# Patient Record
Sex: Male | Born: 1950
Health system: Southern US, Community
[De-identification: ages and names within clinical notes are randomized; demographics above are authoritative.]

## PROBLEM LIST (undated history)

## (undated) DIAGNOSIS — F329 Major depressive disorder, single episode, unspecified: Secondary | ICD-10-CM

## (undated) DIAGNOSIS — E119 Type 2 diabetes mellitus without complications: Secondary | ICD-10-CM

## (undated) DIAGNOSIS — H3530 Unspecified macular degeneration: Secondary | ICD-10-CM

## (undated) DIAGNOSIS — M5030 Other cervical disc degeneration, unspecified cervical region: Secondary | ICD-10-CM

## (undated) DIAGNOSIS — M19042 Primary osteoarthritis, left hand: Secondary | ICD-10-CM

## (undated) DIAGNOSIS — N29 Other disorders of kidney and ureter in diseases classified elsewhere: Secondary | ICD-10-CM

## (undated) DIAGNOSIS — R001 Bradycardia, unspecified: Secondary | ICD-10-CM

## (undated) DIAGNOSIS — M19041 Primary osteoarthritis, right hand: Secondary | ICD-10-CM

## (undated) DIAGNOSIS — M1990 Unspecified osteoarthritis, unspecified site: Secondary | ICD-10-CM

## (undated) DIAGNOSIS — I10 Essential (primary) hypertension: Secondary | ICD-10-CM

## (undated) DIAGNOSIS — N200 Calculus of kidney: Secondary | ICD-10-CM

## (undated) DIAGNOSIS — M069 Rheumatoid arthritis, unspecified: Secondary | ICD-10-CM

## (undated) DIAGNOSIS — M170 Bilateral primary osteoarthritis of knee: Secondary | ICD-10-CM

## (undated) DIAGNOSIS — M064 Inflammatory polyarthropathy: Secondary | ICD-10-CM

## (undated) DIAGNOSIS — E039 Hypothyroidism, unspecified: Secondary | ICD-10-CM

## (undated) DIAGNOSIS — I739 Peripheral vascular disease, unspecified: Secondary | ICD-10-CM

## (undated) DIAGNOSIS — C801 Malignant (primary) neoplasm, unspecified: Secondary | ICD-10-CM

## (undated) DIAGNOSIS — C73 Malignant neoplasm of thyroid gland: Secondary | ICD-10-CM

## (undated) DIAGNOSIS — E785 Hyperlipidemia, unspecified: Secondary | ICD-10-CM

## (undated) DIAGNOSIS — M5136 Other intervertebral disc degeneration, lumbar region: Secondary | ICD-10-CM

## (undated) DIAGNOSIS — N289 Disorder of kidney and ureter, unspecified: Secondary | ICD-10-CM

## (undated) DIAGNOSIS — N486 Induration penis plastica: Secondary | ICD-10-CM

## (undated) DIAGNOSIS — F32A Depression, unspecified: Secondary | ICD-10-CM

## (undated) HISTORY — DX: Depression, unspecified: F32.A

## (undated) HISTORY — PX: GASTRECTOMY: SHX58

## (undated) HISTORY — PX: NEPHRECTOMY: SHX65

## (undated) HISTORY — DX: Disorder of kidney and ureter, unspecified: N28.9

## (undated) HISTORY — DX: Major depressive disorder, single episode, unspecified: F32.9

## (undated) HISTORY — DX: Calculus of kidney: N20.0

## (undated) HISTORY — PX: CHOLECYSTECTOMY: SHX55

## (undated) HISTORY — DX: Hypothyroidism, unspecified: E03.9

## (undated) HISTORY — PX: PANCREATECTOMY: SHX1019

## (undated) HISTORY — PX: GLAUCOMA SURGERY: SHX656

## (undated) HISTORY — DX: Primary osteoarthritis, left hand: M19.042

## (undated) HISTORY — DX: Other cervical disc degeneration, unspecified cervical region: M50.30

## (undated) HISTORY — DX: Essential (primary) hypertension: I10

## (undated) HISTORY — DX: Malignant neoplasm of thyroid gland: C73

## (undated) HISTORY — DX: Rheumatoid arthritis, unspecified: M06.9

## (undated) HISTORY — DX: Type 2 diabetes mellitus without complications: E11.9

## (undated) HISTORY — DX: Other disorders of calcium metabolism: E83.59

## (undated) HISTORY — DX: Malignant (primary) neoplasm, unspecified: C80.1

## (undated) HISTORY — DX: Other intervertebral disc degeneration, lumbar region: M51.36

## (undated) HISTORY — DX: Primary osteoarthritis, right hand: M19.041

## (undated) HISTORY — DX: Unspecified osteoarthritis, unspecified site: M19.90

## (undated) HISTORY — DX: Inflammatory polyarthropathy: M06.4

## (undated) HISTORY — PX: THYROIDECTOMY: SHX17

## (undated) HISTORY — DX: Peripheral vascular disease, unspecified: I73.9

## (undated) HISTORY — PX: WHIPPLE PROCEDURE: SHX2667

## (undated) HISTORY — DX: Other disorders of kidney and ureter in diseases classified elsewhere: N29

## (undated) HISTORY — DX: Bilateral primary osteoarthritis of knee: M17.0

## (undated) HISTORY — PX: SPLENECTOMY: SUR1306

## (undated) HISTORY — DX: Hyperlipidemia, unspecified: E78.5

## (undated) HISTORY — DX: Unspecified macular degeneration: H35.30

---

## 1997-08-04 HISTORY — PX: LIPOMA EXCISION: SHX5283

## 1998-02-16 ENCOUNTER — Other Ambulatory Visit: Admission: RE | Admit: 1998-02-16 | Discharge: 1998-02-16 | Payer: Self-pay | Admitting: Orthopedic Surgery

## 1999-11-27 ENCOUNTER — Encounter: Payer: Self-pay | Admitting: *Deleted

## 1999-11-27 ENCOUNTER — Ambulatory Visit (HOSPITAL_COMMUNITY): Admission: RE | Admit: 1999-11-27 | Discharge: 1999-11-27 | Payer: Self-pay | Admitting: *Deleted

## 1999-11-29 ENCOUNTER — Ambulatory Visit (HOSPITAL_COMMUNITY): Admission: RE | Admit: 1999-11-29 | Discharge: 1999-11-29 | Payer: Self-pay | Admitting: *Deleted

## 1999-11-29 ENCOUNTER — Encounter: Payer: Self-pay | Admitting: *Deleted

## 1999-12-03 ENCOUNTER — Encounter: Payer: Self-pay | Admitting: *Deleted

## 1999-12-03 ENCOUNTER — Encounter: Admission: RE | Admit: 1999-12-03 | Discharge: 1999-12-03 | Payer: Self-pay | Admitting: *Deleted

## 1999-12-17 ENCOUNTER — Encounter: Payer: Self-pay | Admitting: Surgery

## 1999-12-17 ENCOUNTER — Inpatient Hospital Stay (HOSPITAL_COMMUNITY): Admission: RE | Admit: 1999-12-17 | Discharge: 1999-12-27 | Payer: Self-pay | Admitting: Surgery

## 1999-12-17 ENCOUNTER — Encounter (INDEPENDENT_AMBULATORY_CARE_PROVIDER_SITE_OTHER): Payer: Self-pay | Admitting: Specialist

## 1999-12-18 ENCOUNTER — Encounter: Payer: Self-pay | Admitting: Surgery

## 1999-12-24 ENCOUNTER — Encounter: Payer: Self-pay | Admitting: Surgery

## 2000-01-14 ENCOUNTER — Encounter: Admission: RE | Admit: 2000-01-14 | Discharge: 2000-04-13 | Payer: Self-pay | Admitting: Internal Medicine

## 2000-05-18 ENCOUNTER — Ambulatory Visit (HOSPITAL_COMMUNITY): Admission: RE | Admit: 2000-05-18 | Discharge: 2000-05-18 | Payer: Self-pay | Admitting: Oncology

## 2000-05-18 ENCOUNTER — Encounter: Payer: Self-pay | Admitting: Oncology

## 2000-09-17 ENCOUNTER — Ambulatory Visit (HOSPITAL_COMMUNITY): Admission: RE | Admit: 2000-09-17 | Discharge: 2000-09-17 | Payer: Self-pay | Admitting: Oncology

## 2000-09-17 ENCOUNTER — Encounter: Payer: Self-pay | Admitting: Oncology

## 2001-01-29 ENCOUNTER — Encounter: Payer: Self-pay | Admitting: Oncology

## 2001-01-29 ENCOUNTER — Ambulatory Visit (HOSPITAL_COMMUNITY): Admission: RE | Admit: 2001-01-29 | Discharge: 2001-01-29 | Payer: Self-pay | Admitting: Oncology

## 2001-02-20 ENCOUNTER — Encounter: Payer: Self-pay | Admitting: Emergency Medicine

## 2001-02-21 ENCOUNTER — Inpatient Hospital Stay (HOSPITAL_COMMUNITY): Admission: EM | Admit: 2001-02-21 | Discharge: 2001-02-24 | Payer: Self-pay | Admitting: Emergency Medicine

## 2001-02-22 ENCOUNTER — Encounter: Payer: Self-pay | Admitting: Gastroenterology

## 2001-10-01 ENCOUNTER — Encounter: Payer: Self-pay | Admitting: Oncology

## 2001-10-01 ENCOUNTER — Ambulatory Visit (HOSPITAL_COMMUNITY): Admission: RE | Admit: 2001-10-01 | Discharge: 2001-10-01 | Payer: Self-pay | Admitting: Oncology

## 2001-12-10 ENCOUNTER — Encounter: Payer: Self-pay | Admitting: Gastroenterology

## 2001-12-13 ENCOUNTER — Ambulatory Visit (HOSPITAL_COMMUNITY): Admission: RE | Admit: 2001-12-13 | Discharge: 2001-12-13 | Payer: Self-pay | Admitting: Gastroenterology

## 2001-12-13 ENCOUNTER — Encounter: Payer: Self-pay | Admitting: Gastroenterology

## 2001-12-14 ENCOUNTER — Ambulatory Visit (HOSPITAL_COMMUNITY): Admission: RE | Admit: 2001-12-14 | Discharge: 2001-12-14 | Payer: Self-pay | Admitting: Gastroenterology

## 2001-12-14 ENCOUNTER — Encounter: Payer: Self-pay | Admitting: Gastroenterology

## 2002-01-19 ENCOUNTER — Ambulatory Visit (HOSPITAL_COMMUNITY): Admission: RE | Admit: 2002-01-19 | Discharge: 2002-01-19 | Payer: Self-pay | Admitting: Gastroenterology

## 2002-01-19 ENCOUNTER — Encounter: Payer: Self-pay | Admitting: Gastroenterology

## 2002-04-01 ENCOUNTER — Encounter: Payer: Self-pay | Admitting: Oncology

## 2002-04-01 ENCOUNTER — Ambulatory Visit (HOSPITAL_COMMUNITY): Admission: RE | Admit: 2002-04-01 | Discharge: 2002-04-01 | Payer: Self-pay | Admitting: Oncology

## 2002-09-30 ENCOUNTER — Encounter: Payer: Self-pay | Admitting: Oncology

## 2002-09-30 ENCOUNTER — Ambulatory Visit (HOSPITAL_COMMUNITY): Admission: RE | Admit: 2002-09-30 | Discharge: 2002-09-30 | Payer: Self-pay | Admitting: Oncology

## 2002-10-17 ENCOUNTER — Ambulatory Visit (HOSPITAL_COMMUNITY): Admission: RE | Admit: 2002-10-17 | Discharge: 2002-10-17 | Payer: Self-pay | Admitting: Oncology

## 2002-10-17 ENCOUNTER — Encounter: Payer: Self-pay | Admitting: Oncology

## 2002-11-04 ENCOUNTER — Ambulatory Visit (HOSPITAL_COMMUNITY): Admission: RE | Admit: 2002-11-04 | Discharge: 2002-11-04 | Payer: Self-pay | Admitting: Oncology

## 2002-11-04 ENCOUNTER — Encounter: Payer: Self-pay | Admitting: Oncology

## 2002-11-04 ENCOUNTER — Encounter (INDEPENDENT_AMBULATORY_CARE_PROVIDER_SITE_OTHER): Payer: Self-pay | Admitting: *Deleted

## 2003-01-06 ENCOUNTER — Observation Stay (HOSPITAL_COMMUNITY): Admission: RE | Admit: 2003-01-06 | Discharge: 2003-01-07 | Payer: Self-pay | Admitting: Surgery

## 2003-01-06 ENCOUNTER — Encounter (INDEPENDENT_AMBULATORY_CARE_PROVIDER_SITE_OTHER): Payer: Self-pay | Admitting: Specialist

## 2003-04-03 ENCOUNTER — Ambulatory Visit (HOSPITAL_COMMUNITY): Admission: RE | Admit: 2003-04-03 | Discharge: 2003-04-03 | Payer: Self-pay | Admitting: Oncology

## 2003-04-03 ENCOUNTER — Encounter: Payer: Self-pay | Admitting: Oncology

## 2003-09-26 ENCOUNTER — Other Ambulatory Visit: Admission: RE | Admit: 2003-09-26 | Discharge: 2003-09-26 | Payer: Self-pay | Admitting: Oncology

## 2003-09-26 ENCOUNTER — Inpatient Hospital Stay (HOSPITAL_COMMUNITY): Admission: EM | Admit: 2003-09-26 | Discharge: 2003-10-06 | Payer: Self-pay | Admitting: Emergency Medicine

## 2003-09-27 ENCOUNTER — Encounter: Payer: Self-pay | Admitting: Internal Medicine

## 2003-09-27 ENCOUNTER — Encounter (INDEPENDENT_AMBULATORY_CARE_PROVIDER_SITE_OTHER): Payer: Self-pay | Admitting: Specialist

## 2003-09-29 ENCOUNTER — Encounter (INDEPENDENT_AMBULATORY_CARE_PROVIDER_SITE_OTHER): Payer: Self-pay | Admitting: Specialist

## 2004-02-12 ENCOUNTER — Ambulatory Visit (HOSPITAL_COMMUNITY): Admission: RE | Admit: 2004-02-12 | Discharge: 2004-02-12 | Payer: Self-pay | Admitting: Oncology

## 2006-12-15 ENCOUNTER — Ambulatory Visit: Payer: Self-pay | Admitting: Oncology

## 2006-12-15 LAB — CBC WITH DIFFERENTIAL/PLATELET
BASO%: 0.5 % (ref 0.0–2.0)
EOS%: 2.6 % (ref 0.0–7.0)
LYMPH%: 42.4 % (ref 14.0–48.0)
MCH: 33.1 pg (ref 28.0–33.4)
MCHC: 34.8 g/dL (ref 32.0–35.9)
MONO#: 1 10*3/uL — ABNORMAL HIGH (ref 0.1–0.9)
Platelets: 277 10*3/uL (ref 145–400)
RBC: 4.3 10*6/uL (ref 4.20–5.71)
WBC: 6.8 10*3/uL (ref 4.0–10.0)
lymph#: 2.9 10*3/uL (ref 0.9–3.3)

## 2006-12-15 LAB — COMPREHENSIVE METABOLIC PANEL
ALT: 23 U/L (ref 0–53)
AST: 22 U/L (ref 0–37)
CO2: 27 mEq/L (ref 19–32)
Creatinine, Ser: 1.03 mg/dL (ref 0.40–1.50)
Sodium: 141 mEq/L (ref 135–145)
Total Bilirubin: 2.2 mg/dL — ABNORMAL HIGH (ref 0.3–1.2)
Total Protein: 6.9 g/dL (ref 6.0–8.3)

## 2006-12-15 LAB — LACTATE DEHYDROGENASE: LDH: 139 U/L (ref 94–250)

## 2006-12-21 ENCOUNTER — Ambulatory Visit (HOSPITAL_COMMUNITY): Admission: RE | Admit: 2006-12-21 | Discharge: 2006-12-21 | Payer: Self-pay | Admitting: Oncology

## 2007-02-03 ENCOUNTER — Ambulatory Visit: Payer: Self-pay | Admitting: Oncology

## 2007-02-08 LAB — CBC WITH DIFFERENTIAL/PLATELET
BASO%: 0.4 % (ref 0.0–2.0)
Basophils Absolute: 0 10*3/uL (ref 0.0–0.1)
EOS%: 2.2 % (ref 0.0–7.0)
HCT: 42.9 % (ref 38.7–49.9)
HGB: 14.9 g/dL (ref 13.0–17.1)
LYMPH%: 33.3 % (ref 14.0–48.0)
MCH: 33.3 pg (ref 28.0–33.4)
MCHC: 34.8 g/dL (ref 32.0–35.9)
MCV: 95.8 fL (ref 81.6–98.0)
MONO%: 14.6 % — ABNORMAL HIGH (ref 0.0–13.0)
NEUT%: 49.5 % (ref 40.0–75.0)
Platelets: 218 10*3/uL (ref 145–400)
lymph#: 2.2 10*3/uL (ref 0.9–3.3)

## 2007-02-08 LAB — COMPREHENSIVE METABOLIC PANEL
ALT: 23 U/L (ref 0–53)
AST: 22 U/L (ref 0–37)
Alkaline Phosphatase: 98 U/L (ref 39–117)
BUN: 15 mg/dL (ref 6–23)
Calcium: 9.2 mg/dL (ref 8.4–10.5)
Chloride: 106 mEq/L (ref 96–112)
Creatinine, Ser: 1.09 mg/dL (ref 0.40–1.50)
Total Bilirubin: 1.4 mg/dL — ABNORMAL HIGH (ref 0.3–1.2)

## 2007-02-09 ENCOUNTER — Ambulatory Visit (HOSPITAL_COMMUNITY): Admission: RE | Admit: 2007-02-09 | Discharge: 2007-02-09 | Payer: Self-pay | Admitting: Oncology

## 2007-02-10 ENCOUNTER — Ambulatory Visit: Payer: Self-pay | Admitting: Gastroenterology

## 2007-08-16 ENCOUNTER — Ambulatory Visit: Payer: Self-pay | Admitting: Oncology

## 2007-08-18 LAB — COMPREHENSIVE METABOLIC PANEL
Albumin: 4 g/dL (ref 3.5–5.2)
BUN: 16 mg/dL (ref 6–23)
Calcium: 8.7 mg/dL (ref 8.4–10.5)
Chloride: 104 mEq/L (ref 96–112)
Creatinine, Ser: 1.03 mg/dL (ref 0.40–1.50)
Glucose, Bld: 337 mg/dL — ABNORMAL HIGH (ref 70–99)
Potassium: 4.4 mEq/L (ref 3.5–5.3)

## 2007-08-18 LAB — CBC WITH DIFFERENTIAL/PLATELET
Basophils Absolute: 0 10*3/uL (ref 0.0–0.1)
Eosinophils Absolute: 0.1 10*3/uL (ref 0.0–0.5)
HCT: 42.3 % (ref 38.7–49.9)
HGB: 14.3 g/dL (ref 13.0–17.1)
MCH: 33 pg (ref 28.0–33.4)
MCV: 97.6 fL (ref 81.6–98.0)
MONO%: 13.9 % — ABNORMAL HIGH (ref 0.0–13.0)
NEUT#: 6.3 10*3/uL (ref 1.5–6.5)
NEUT%: 66.9 % (ref 40.0–75.0)
RDW: 13.4 % (ref 11.2–14.6)

## 2007-08-19 ENCOUNTER — Ambulatory Visit (HOSPITAL_COMMUNITY): Admission: RE | Admit: 2007-08-19 | Discharge: 2007-08-19 | Payer: Self-pay | Admitting: Oncology

## 2007-12-04 DIAGNOSIS — I10 Essential (primary) hypertension: Secondary | ICD-10-CM

## 2007-12-04 DIAGNOSIS — C7900 Secondary malignant neoplasm of unspecified kidney and renal pelvis: Secondary | ICD-10-CM | POA: Insufficient documentation

## 2007-12-04 DIAGNOSIS — R51 Headache: Secondary | ICD-10-CM

## 2007-12-04 DIAGNOSIS — K209 Esophagitis, unspecified without bleeding: Secondary | ICD-10-CM | POA: Insufficient documentation

## 2007-12-04 DIAGNOSIS — R519 Headache, unspecified: Secondary | ICD-10-CM | POA: Insufficient documentation

## 2007-12-04 DIAGNOSIS — E119 Type 2 diabetes mellitus without complications: Secondary | ICD-10-CM

## 2007-12-04 DIAGNOSIS — K921 Melena: Secondary | ICD-10-CM | POA: Insufficient documentation

## 2007-12-04 DIAGNOSIS — E039 Hypothyroidism, unspecified: Secondary | ICD-10-CM

## 2008-02-14 ENCOUNTER — Ambulatory Visit: Payer: Self-pay | Admitting: Oncology

## 2008-02-16 LAB — COMPREHENSIVE METABOLIC PANEL
ALT: 40 U/L (ref 0–53)
Albumin: 4.3 g/dL (ref 3.5–5.2)
CO2: 22 mEq/L (ref 19–32)
Chloride: 105 mEq/L (ref 96–112)
Glucose, Bld: 167 mg/dL — ABNORMAL HIGH (ref 70–99)
Potassium: 4.6 mEq/L (ref 3.5–5.3)
Sodium: 139 mEq/L (ref 135–145)
Total Protein: 6.2 g/dL (ref 6.0–8.3)

## 2008-02-16 LAB — CBC WITH DIFFERENTIAL/PLATELET
Eosinophils Absolute: 0.1 10*3/uL (ref 0.0–0.5)
MONO#: 0.9 10*3/uL (ref 0.1–0.9)
NEUT#: 4.1 10*3/uL (ref 1.5–6.5)
Platelets: 205 10*3/uL (ref 145–400)
RBC: 4.13 10*6/uL — ABNORMAL LOW (ref 4.20–5.71)
RDW: 12.9 % (ref 11.2–14.6)
WBC: 7.8 10*3/uL (ref 4.0–10.0)
lymph#: 2.7 10*3/uL (ref 0.9–3.3)

## 2008-02-16 LAB — LACTATE DEHYDROGENASE: LDH: 231 U/L (ref 94–250)

## 2008-02-18 ENCOUNTER — Ambulatory Visit (HOSPITAL_COMMUNITY): Admission: RE | Admit: 2008-02-18 | Discharge: 2008-02-18 | Payer: Self-pay | Admitting: Oncology

## 2008-02-23 ENCOUNTER — Encounter: Payer: Self-pay | Admitting: Gastroenterology

## 2008-09-06 ENCOUNTER — Observation Stay (HOSPITAL_COMMUNITY): Admission: EM | Admit: 2008-09-06 | Discharge: 2008-09-07 | Payer: Self-pay | Admitting: Emergency Medicine

## 2008-09-18 ENCOUNTER — Ambulatory Visit (HOSPITAL_COMMUNITY): Admission: RE | Admit: 2008-09-18 | Discharge: 2008-09-18 | Payer: Self-pay | Admitting: Urology

## 2008-09-19 ENCOUNTER — Ambulatory Visit: Payer: Self-pay | Admitting: Oncology

## 2008-10-12 ENCOUNTER — Ambulatory Visit (HOSPITAL_BASED_OUTPATIENT_CLINIC_OR_DEPARTMENT_OTHER): Admission: RE | Admit: 2008-10-12 | Discharge: 2008-10-12 | Payer: Self-pay | Admitting: Urology

## 2010-08-04 HISTORY — PX: URETERAL STENT PLACEMENT: SHX822

## 2010-08-24 ENCOUNTER — Encounter: Payer: Self-pay | Admitting: Oncology

## 2010-08-25 ENCOUNTER — Encounter: Payer: Self-pay | Admitting: Oncology

## 2010-10-26 ENCOUNTER — Emergency Department (HOSPITAL_COMMUNITY): Payer: Medicare Other

## 2010-10-26 ENCOUNTER — Inpatient Hospital Stay (HOSPITAL_COMMUNITY)
Admission: EM | Admit: 2010-10-26 | Discharge: 2010-10-31 | DRG: 669 | Disposition: A | Discharge status: Home / Self Care | Payer: Medicare Other | Attending: Internal Medicine | Admitting: Internal Medicine

## 2010-10-26 DIAGNOSIS — N201 Calculus of ureter: Secondary | ICD-10-CM | POA: Diagnosis present

## 2010-10-26 DIAGNOSIS — G8929 Other chronic pain: Secondary | ICD-10-CM | POA: Diagnosis present

## 2010-10-26 DIAGNOSIS — I10 Essential (primary) hypertension: Secondary | ICD-10-CM | POA: Diagnosis present

## 2010-10-26 DIAGNOSIS — Z905 Acquired absence of kidney: Secondary | ICD-10-CM

## 2010-10-26 DIAGNOSIS — I70209 Unspecified atherosclerosis of native arteries of extremities, unspecified extremity: Secondary | ICD-10-CM | POA: Diagnosis present

## 2010-10-26 DIAGNOSIS — Z794 Long term (current) use of insulin: Secondary | ICD-10-CM

## 2010-10-26 DIAGNOSIS — N1330 Unspecified hydronephrosis: Secondary | ICD-10-CM | POA: Diagnosis present

## 2010-10-26 DIAGNOSIS — Z903 Acquired absence of stomach [part of]: Secondary | ICD-10-CM

## 2010-10-26 DIAGNOSIS — E109 Type 1 diabetes mellitus without complications: Secondary | ICD-10-CM | POA: Diagnosis present

## 2010-10-26 DIAGNOSIS — E785 Hyperlipidemia, unspecified: Secondary | ICD-10-CM | POA: Diagnosis present

## 2010-10-26 DIAGNOSIS — M159 Polyosteoarthritis, unspecified: Secondary | ICD-10-CM | POA: Diagnosis present

## 2010-10-26 DIAGNOSIS — Z85528 Personal history of other malignant neoplasm of kidney: Secondary | ICD-10-CM

## 2010-10-26 DIAGNOSIS — N179 Acute kidney failure, unspecified: Principal | ICD-10-CM | POA: Diagnosis present

## 2010-10-26 DIAGNOSIS — Z79899 Other long term (current) drug therapy: Secondary | ICD-10-CM

## 2010-10-26 DIAGNOSIS — E46 Unspecified protein-calorie malnutrition: Secondary | ICD-10-CM | POA: Diagnosis present

## 2010-10-26 DIAGNOSIS — K219 Gastro-esophageal reflux disease without esophagitis: Secondary | ICD-10-CM | POA: Diagnosis present

## 2010-10-26 DIAGNOSIS — K8689 Other specified diseases of pancreas: Secondary | ICD-10-CM | POA: Diagnosis present

## 2010-10-26 DIAGNOSIS — Z85850 Personal history of malignant neoplasm of thyroid: Secondary | ICD-10-CM

## 2010-10-26 DIAGNOSIS — Z9089 Acquired absence of other organs: Secondary | ICD-10-CM

## 2010-10-26 LAB — CBC
HCT: 40.7 % (ref 39.0–52.0)
Hemoglobin: 14.2 g/dL (ref 13.0–17.0)
MCH: 33.1 pg (ref 26.0–34.0)
MCV: 94.9 fL (ref 78.0–100.0)
Platelets: 185 10*3/uL (ref 150–400)
RBC: 4.29 MIL/uL (ref 4.22–5.81)
WBC: 15.1 10*3/uL — ABNORMAL HIGH (ref 4.0–10.5)

## 2010-10-26 LAB — BASIC METABOLIC PANEL
CO2: 22 mEq/L (ref 19–32)
Calcium: 7.4 mg/dL — ABNORMAL LOW (ref 8.4–10.5)
GFR calc Af Amer: 9 mL/min — ABNORMAL LOW (ref >60)
Glucose, Bld: 280 mg/dL — ABNORMAL HIGH (ref 70–99)
Potassium: 4 mEq/L (ref 3.5–5.1)
Sodium: 130 mEq/L — ABNORMAL LOW (ref 135–145)

## 2010-10-26 LAB — DIFFERENTIAL
Eosinophils Absolute: 0.1 10*3/uL (ref 0.0–0.7)
Lymphocytes Relative: 15 % (ref 12–46)
Lymphs Abs: 2.2 10*3/uL (ref 0.7–4.0)
Monocytes Relative: 16 % — ABNORMAL HIGH (ref 3–12)
Neutro Abs: 10.4 10*3/uL — ABNORMAL HIGH (ref 1.7–7.7)
Neutrophils Relative %: 69 % (ref 43–77)

## 2010-10-26 LAB — APTT: aPTT: 29 seconds (ref 24–37)

## 2010-10-26 LAB — COMPREHENSIVE METABOLIC PANEL
AST: 24 U/L (ref 0–37)
Albumin: 3.5 g/dL (ref 3.5–5.2)
Alkaline Phosphatase: 81 U/L (ref 39–117)
BUN: 55 mg/dL — ABNORMAL HIGH (ref 6–23)
Creatinine, Ser: 7.87 mg/dL — ABNORMAL HIGH (ref 0.4–1.5)
GFR calc Af Amer: 9 mL/min — ABNORMAL LOW (ref >60)
Potassium: 4.1 mEq/L (ref 3.5–5.1)
Total Protein: 6.2 g/dL (ref 6.0–8.3)

## 2010-10-26 LAB — GLUCOSE, CAPILLARY
Glucose-Capillary: 298 mg/dL — ABNORMAL HIGH (ref 70–99)
Glucose-Capillary: 306 mg/dL — ABNORMAL HIGH (ref 70–99)
Glucose-Capillary: 351 mg/dL — ABNORMAL HIGH (ref 70–99)

## 2010-10-26 LAB — MRSA PCR SCREENING: MRSA by PCR: POSITIVE — AB

## 2010-10-26 LAB — PROTIME-INR: INR: 1.09 (ref 0.00–1.49)

## 2010-10-27 LAB — GLUCOSE, CAPILLARY
Glucose-Capillary: 455 mg/dL — ABNORMAL HIGH (ref 70–99)
Glucose-Capillary: 387 mg/dL — ABNORMAL HIGH (ref 70–99)
Glucose-Capillary: 311 mg/dL — ABNORMAL HIGH (ref 70–99)

## 2010-10-27 LAB — DIFFERENTIAL
Eosinophils Absolute: 0.1 10*3/uL (ref 0.0–0.7)
Eosinophils Relative: 1 % (ref 0–5)
Lymphs Abs: 1.7 10*3/uL (ref 0.7–4.0)
Monocytes Relative: 20 % — ABNORMAL HIGH (ref 3–12)

## 2010-10-27 LAB — PHOSPHORUS: Phosphorus: 4.1 mg/dL (ref 2.3–4.6)

## 2010-10-27 LAB — HEPATIC FUNCTION PANEL
AST: 16 U/L (ref 0–37)
Albumin: 2.6 g/dL — ABNORMAL LOW (ref 3.5–5.2)
Alkaline Phosphatase: 66 U/L (ref 39–117)
Total Bilirubin: 1.2 mg/dL (ref 0.3–1.2)
Total Protein: 4.8 g/dL — ABNORMAL LOW (ref 6.0–8.3)

## 2010-10-27 LAB — CBC
MCH: 32.2 pg (ref 26.0–34.0)
MCV: 95.3 fL (ref 78.0–100.0)

## 2010-10-27 LAB — BASIC METABOLIC PANEL
CO2: 18 mEq/L — ABNORMAL LOW (ref 19–32)
Calcium: 6.4 mg/dL — CL (ref 8.4–10.5)
Chloride: 108 mEq/L (ref 96–112)
Creatinine, Ser: 4.4 mg/dL — ABNORMAL HIGH (ref 0.4–1.5)
Glucose, Bld: 297 mg/dL — ABNORMAL HIGH (ref 70–99)

## 2010-10-28 LAB — CBC
Hemoglobin: 11.1 g/dL — ABNORMAL LOW (ref 13.0–17.0)
MCV: 92.8 fL (ref 78.0–100.0)
Platelets: 151 10*3/uL (ref 150–400)
RBC: 3.47 MIL/uL — ABNORMAL LOW (ref 4.22–5.81)
WBC: 10.3 10*3/uL (ref 4.0–10.5)

## 2010-10-28 LAB — GLUCOSE, CAPILLARY
Glucose-Capillary: 232 mg/dL — ABNORMAL HIGH (ref 70–99)
Glucose-Capillary: 226 mg/dL — ABNORMAL HIGH (ref 70–99)

## 2010-10-28 LAB — DIFFERENTIAL
Basophils Absolute: 0 10*3/uL (ref 0.0–0.1)
Basophils Relative: 0 % (ref 0–1)
Eosinophils Absolute: 0.1 10*3/uL (ref 0.0–0.7)
Lymphocytes Relative: 29 % (ref 12–46)
Monocytes Relative: 18 % — ABNORMAL HIGH (ref 3–12)
Neutro Abs: 5.3 10*3/uL (ref 1.7–7.7)
Neutrophils Relative %: 52 % (ref 43–77)

## 2010-10-28 LAB — BASIC METABOLIC PANEL
BUN: 27 mg/dL — ABNORMAL HIGH (ref 6–23)
Chloride: 115 mEq/L — ABNORMAL HIGH (ref 96–112)
Creatinine, Ser: 1.68 mg/dL — ABNORMAL HIGH (ref 0.4–1.5)
Glucose, Bld: 146 mg/dL — ABNORMAL HIGH (ref 70–99)
Potassium: 3.2 mEq/L — ABNORMAL LOW (ref 3.5–5.1)

## 2010-10-28 LAB — HEPATIC FUNCTION PANEL
ALT: 13 U/L (ref 0–53)
AST: 13 U/L (ref 0–37)
Alkaline Phosphatase: 57 U/L (ref 39–117)
Total Protein: 4.8 g/dL — ABNORMAL LOW (ref 6.0–8.3)

## 2010-10-29 LAB — DIFFERENTIAL
Eosinophils Relative: 3 % (ref 0–5)
Lymphocytes Relative: 37 % (ref 12–46)
Lymphs Abs: 3.3 10*3/uL (ref 0.7–4.0)
Monocytes Absolute: 1.8 10*3/uL — ABNORMAL HIGH (ref 0.1–1.0)
Monocytes Relative: 20 % — ABNORMAL HIGH (ref 3–12)

## 2010-10-29 LAB — BASIC METABOLIC PANEL
BUN: 14 mg/dL (ref 6–23)
Creatinine, Ser: 1.25 mg/dL (ref 0.4–1.5)
GFR calc non Af Amer: 59 mL/min — ABNORMAL LOW (ref >60)
Glucose, Bld: 123 mg/dL — ABNORMAL HIGH (ref 70–99)
Potassium: 3.5 mEq/L (ref 3.5–5.1)

## 2010-10-29 LAB — HEPATIC FUNCTION PANEL
ALT: 14 U/L (ref 0–53)
Alkaline Phosphatase: 53 U/L (ref 39–117)
Bilirubin, Direct: 0.2 mg/dL (ref 0.0–0.3)
Indirect Bilirubin: 0.7 mg/dL (ref 0.3–0.9)

## 2010-10-29 LAB — CBC
HCT: 30.9 % — ABNORMAL LOW (ref 39.0–52.0)
MCH: 32.3 pg (ref 26.0–34.0)
MCV: 93.4 fL (ref 78.0–100.0)
RDW: 13.2 % (ref 11.5–15.5)
WBC: 9.1 10*3/uL (ref 4.0–10.5)

## 2010-10-29 LAB — GLUCOSE, CAPILLARY
Glucose-Capillary: 133 mg/dL — ABNORMAL HIGH (ref 70–99)
Glucose-Capillary: 207 mg/dL — ABNORMAL HIGH (ref 70–99)

## 2010-10-30 LAB — CBC
Hemoglobin: 11.2 g/dL — ABNORMAL LOW (ref 13.0–17.0)
MCH: 31.5 pg (ref 26.0–34.0)
Platelets: 190 10*3/uL (ref 150–400)
RBC: 3.56 MIL/uL — ABNORMAL LOW (ref 4.22–5.81)
WBC: 7.6 10*3/uL (ref 4.0–10.5)

## 2010-10-30 LAB — GLUCOSE, CAPILLARY
Glucose-Capillary: 107 mg/dL — ABNORMAL HIGH (ref 70–99)
Glucose-Capillary: 277 mg/dL — ABNORMAL HIGH (ref 70–99)

## 2010-10-30 LAB — HEPATIC FUNCTION PANEL
ALT: 16 U/L (ref 0–53)
Albumin: 2.6 g/dL — ABNORMAL LOW (ref 3.5–5.2)
Indirect Bilirubin: 0.7 mg/dL (ref 0.3–0.9)
Total Protein: 4.9 g/dL — ABNORMAL LOW (ref 6.0–8.3)

## 2010-10-30 LAB — DIFFERENTIAL
Basophils Relative: 1 % (ref 0–1)
Eosinophils Absolute: 0.3 10*3/uL (ref 0.0–0.7)
Neutrophils Relative %: 40 % — ABNORMAL LOW (ref 43–77)

## 2010-10-30 LAB — BASIC METABOLIC PANEL
BUN: 7 mg/dL (ref 6–23)
CO2: 28 mEq/L (ref 19–32)
Chloride: 112 mEq/L (ref 96–112)
Potassium: 3.6 mEq/L (ref 3.5–5.1)

## 2010-10-30 NOTE — Op Note (Signed)
  NAMECOLE, FURRH             ACCOUNT NO.:  [id number]  MEDICAL RECORD NO.:  [id number]           PATIENT TYPE:  E  LOCATION:  WLED                         FACILITY:  [facility]  PHYSICIAN:  Piotr Christopher I. Gaynelle Arabian, M.D.DATE OF BIRTH:  Dec 26, 1950  DATE OF PROCEDURE:  10/26/2010 DATE OF DISCHARGE:                              OPERATIVE REPORT   PREOPERATIVE DIAGNOSIS:  Solitary left kidney with complete impaction of left lower ureteral calculus.  POSTOPERATIVE DIAGNOSIS:  Solitary left kidney with complete impaction of left lower ureteral calculus.  OPERATION:  Cystourethroscopy, left retrograde pyelogram with interpretation, left ureteroscopy and laser fragmentation of ureteral stone, and basket extraction of stone.  SURGEON:  Nadra Hritz I. Gaynelle Arabian, M.D.  ANESTHESIA:  General LMA.  PREPARATION:  After appropriate preanesthesia, the patient was brought to the operating room, placed on the operating room in dorsal supine position where general LMA anesthesia was induced.  He was then replaced in dorsal lithotomy position where pubis was prepped with Betadine solution and draped in usual fashion.  REVIEW OF HISTORY:  Mr. Coffee is a 60 year old Caucasian male, seen in [place] [facility] with a solitary left kidney, and complete obstruction from the left lower ureteral calculus, measuring 7.3 mm. The patient's creatinine is 7.5.  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  The patient has tolerated procedure well.  He is now for ureteroscopy and basket extraction.  PROCEDURE IN DETAIL:  Cystourethroscopy was accomplished and left retrograde pyelogram was performed, which showed impacted stone in the left ureter with minimal contrast above the stone.  The laser was then placed through the  ureteroscope and the stone was fragmented in multiple pieces.  These were removed.  There was some "dust" remaining in the ureter at the end, I elected to place double-J stent because of the patient's elevated creatinine and solitary kidney, and therefore a 6-French x 24 cm stent was placed without difficulty.  The patient tolerated procedure well.  He was given a B and O suppository.  He was awakened and taken to recovery room in good condition.     Loudon Krakow I. Gaynelle Arabian, M.D.     SIT/MEDQ  D:  10/26/2010  T:  10/26/2010  Job:  [id number]  cc:   Elta Guadeloupe A. Perini, M.D. FaxCO:[phone]  Electronically Signed by Carolan Clines M.D. on 10/30/2010 09:51:26 AM

## 2010-10-30 NOTE — Consult Note (Signed)
NAMEDEMARIUS, ALVIRA             ACCOUNT NO.:  [id number]  MEDICAL RECORD NO.:  [id number]           PATIENT TYPE:  E  LOCATION:  WLED                         FACILITY:  [facility]  PHYSICIAN:  Imaan Padgett I. Gaynelle Arabian, M.D.DATE OF BIRTH:  29-Jan-1951  DATE OF CONSULTATION:  10/26/2010 DATE OF DISCHARGE:                                CONSULTATION   TIME:  11:00 a.m.  HISTORY:  Mr. Reali is a 60 year old male who presented to [facility] Emergency Room today with left flank pain, left lower quadrant pain, nausea, and vomiting.  He has a history of solitary kidney and had creatinine value of 7.5 on examination today.  Noncontrast CT was accomplished and shows a 5 x 7 mm stone in the left ureter at the pelvic brim.  He has proximal hydronephrosis.  Urology has been consulted.  PAST MEDICAL HISTORY:  Significant for right radical nephrectomy in 1991 for renal cell carcinoma.  The patient had a recurrence in 2001, treated with pancreatectomy and another isolated occurrence in 2003 treated with thyroidectomy.  He had a third recurrence in 2005, that was isolated again and was treated with partial gastrectomy (Dr. Armandina Gemma).  His medical oncologist is Dr. Eston Esters.  He has been disease free since 2005.  With regard to his kidney stones, the patient has a history of calcium oxalate nephrolithiasis with longstanding history of multiple passed kidney stones.  In 2010, the patient had obstruction of the solitary kidney and acute renal failure due to the ureteral stone, which was treated with lithotripsy in the past and also left ureteroscopy and laser lithotripsy as well.  The patient has been treated by Dr. Raynelle Bring with stone prevention, however, 24-hour urine was never accomplished by the patient.  He now presents with CT finding of 7.3 x 5.5 mm stone in the left lower ureter with a creatinine of 7.5.  PAST MEDICAL HISTORY: 1. Acute renal failure. 2. Arthritis. 3.  Diabetes. 4. Hypertension. 5. Hyperthyroidism. 6. Renal cell carcinoma.  PAST SURGICAL HISTORY: 1. Cysto, ureteral stent (multiple). 2. Lithotripsy. 3. Partial gastrectomy. 4. Right radical nephrectomy. 5. Total thyroidectomy. 6. Tonsillectomy. 7. Pancreatectomy.  CURRENT MEDICATIONS:  Include: 1. Amlodipine 5 mg one per day. 2. Diovan 160/12.5 one per day. 3. Lovastatin 40 mg one per day. 4. Atenolol 100 mg one per day. 5. Levothyroxine 200 mcg 1 p.o. per day. 6. Famotidine 20 mg per day. 7. Creon DR 6000 units (14 total capsules) daily before meals as     directed. 8. Minoxidil 10 mg one-half to 1 tablet p.o. per day with food. 9. Hydrocodone 5/500 4 times a day p.r.n. for moderate pain. 10.Tramadol 50 mg per day for mild pain. 11.Insulin Levemir morning and night 6 units b.i.d. and Apidra sliding     scale 6 units per main meals.  ALLERGIES:  None known.  PHYSICAL EXAMINATION:  GENERAL:  Current physical exam shows a well- developed, well-nourished white male, in mild distress. NECK:  Supple, nontender. CHEST:  Clear to P and A. ABDOMEN:  Scaphoid, decreased bowel sounds.  There is mild left flank pain and mild left lower quadrant pain  to palpation. GENITOURINARY:  Showed normal penis, normal scrotum. EXTREMITIES:  Showed no cyanosis and no edema.  IMPRESSION:  Solitary left kidney with obstructing 7.3 mm x 5.5 mm stone at the pelvic brim.  PLAN:  We will take him to the operating room for cystoscopy, left retrograde pyelogram, possible laser lithotripsy of stone and double-J stent.     Kyliah Deanda I. Gaynelle Arabian, M.D.     SIT/MEDQ  D:  10/26/2010  T:  10/26/2010  Job:  [id number]  cc:   Elta Guadeloupe A. Perini, M.D. FaxCO:[phone]  Electronically Signed by Carolan Clines M.D. on 10/30/2010 09:51:23 AM

## 2010-10-31 LAB — BASIC METABOLIC PANEL
BUN: 6 mg/dL (ref 6–23)
GFR calc Af Amer: >60 mL/min (ref >60)
GFR calc non Af Amer: >60 mL/min (ref >60)
Potassium: 3.8 mEq/L (ref 3.5–5.1)
Sodium: 140 mEq/L (ref 135–145)

## 2010-10-31 LAB — GLUCOSE, CAPILLARY: Glucose-Capillary: 208 mg/dL — ABNORMAL HIGH (ref 70–99)

## 2010-10-31 LAB — CBC
MCH: 32 pg (ref 26.0–34.0)
MCHC: 34 g/dL (ref 30.0–36.0)
MCV: 94.3 fL (ref 78.0–100.0)
Platelets: 224 10*3/uL (ref 150–400)
RDW: 13.4 % (ref 11.5–15.5)

## 2010-10-31 LAB — DIFFERENTIAL
Eosinophils Absolute: 0.3 10*3/uL (ref 0.0–0.7)
Eosinophils Relative: 5 % (ref 0–5)
Lymphs Abs: 2.9 10*3/uL (ref 0.7–4.0)
Monocytes Relative: 16 % — ABNORMAL HIGH (ref 3–12)

## 2010-10-31 LAB — HEPATIC FUNCTION PANEL
Bilirubin, Direct: 0.2 mg/dL (ref 0.0–0.3)
Indirect Bilirubin: 0.7 mg/dL (ref 0.3–0.9)

## 2010-10-31 NOTE — H&P (Signed)
NAMEYISSACHAR, MCKELL             ACCOUNT NO.:  [id number]  MEDICAL RECORD NO.:  [id number]           PATIENT TYPE:  E  LOCATION:  WLED                         FACILITY:  [facility]  PHYSICIAN:  Rahn Lacuesta A. Bryony Kaman, M.D.   DATE OF BIRTH:  29-Aug-1950  DATE OF ADMISSION:  10/26/2010 DATE OF DISCHARGE:                             HISTORY & PHYSICAL   CHIEF COMPLAINT:  Inability to urinate.  HISTORY OF PRESENT ILLNESS:  Mollie is a pleasant 60 year old male with past medical history as listed below.  He was just in our office for routine visit recently.  He was in his usual state of health until Friday morning, he fell against a door knob in the bathroom, this took his wind out of him and really knocked him down.  He then recovered and sat on the toilet for a while.  He has noticed over the ensuing several hours that he had continued pain in the area and he was unable to urinate at all.  Due to this symptom, he presented to the emergency room where he was found to have an obstructing kidney stone and he only has one kidney due to previous history of renal cell cancer.  Furthermore, his creatinine is over 7 whereas his baseline is 1.0 from last June.  He is being taken to the operating room.  However, we are asked to admit him due to his acute renal failure.  PAST MEDICAL HISTORY: 1. Hypertension. 2. Peripheral vascular disease. 3. Renal cell carcinoma status post right nephrectomy in 1989. 4. Severe osteoarthritis, generalized 5. Type 1 diabetes mellitus, controlled. 6. History of esophageal variceal bleed. 7. History of thyroid cancer. 8. Hypertension. 9. Hyperlipidemia.  He had a lumbar fusion in 1996.  He had a right arm lipoma removed in 1999 and in May of 2001 he had a total pancreatectomy, splenectomy, gastroduodenoscopy, and cholecystectomy and he had a subtotal gastrectomy in February of 2005.  He has hypothyroidism.  ALLERGIES:  None.  MEDICATIONS: 1. Colace 100 mg as  needed. 2. Diovan HCT 160/12.5 one tablet daily. 3. Ferrous sulfate 325 mg 3 times daily. 4. Geritol complete 1 tablet daily. 5. Apidra 4 to 7 units subcutaneously with each meal. 6. Levemir 6 units twice daily. 7. Lovastatin 40 mg daily. 8. Pepcid 20 mg three times a day. 9. Synthroid 200 mcg daily. 10.B12 tablet daily. 11.Creon 6000 units 14 capsules total daily before meals. 12.Atenolol 100 mg daily. 13.Diprolene cream applied as needed to skin rash. 14.Tramadol 50 mg up to four times daily as needed. 15.Vicodin 5/500 as needed. 16.Sorbitol 2 tablespoons by mouth once daily as needed. 17.Amlodipine 5 mg daily. 18.Minoxidil 10 mg once a day.  SOCIAL HISTORY:  He is married for over 27 years, he has one child.  He is retired from [employer].  He is disabled.  He is a nonsmoker, nondrinker.  He has one daughter.  REVIEW OF SYSTEMS:  The patient has felt well lately.  He denies any chest pains or shortness of breath.  He has had normal oral intake and bowel and bladder function up until the event of today.  PHYSICAL EXAMINATION:  VITAL SIGNS:  Temperature 97.9, blood pressure 93/51, pulse 49, respiratory rate 18, 96% saturation on room air. GENERAL:  He is lying on A stretcher in the operating room preoperative area.  He is in no acute distress.  He is alert and oriented x4. NECK:  There is no JVD.  No pallor, no icterus. LUNGS:  Clear to auscultation bilaterally with no wheezes, rales or rhonchi. HEART:  Regular rate and rhythm with no significant murmur, rub or gallop. ABDOMEN:  Soft, nontender, nondistended with no mass or hepatosplenomegaly.  There is no peripheral edema.  LABORATORY DATA:  White count 15100, platelet count 185000, hemoglobin 14.2.  There are 69% segs, 15% lymphocytes, 16% monocytes.  Glucose was 259.  Sodium 130, potassium 4.0, chloride 99, CO2 of 22.  Glucose on the BMET was 280.  BUN 52, creatinine 7.51, calcium 7.4, GFR 7, INR  1.09, PTT 29.  Repeat BUN and creatinine were 55 and 7.87.  CT scan of the abdomen and pelvis without contrast media showed 2 stable left lower lobe pulmonary nodules that measured 5 to 6.5 mm in size with a 5 x 7 mm stone in the mid left ureter obtained from the terminal body at the level of the L4-L5 disk space.  This stone causes moderate left hydroureteronephrosis.  There is perinephric and periureteric stranding. There is a 4-mm stone in the lower pole of the left kidney.  Left rib and chest x-ray shows no evidence for acute cardiopulmonary abnormality, no evidence for rib fracture.  ASSESSMENT AND PLAN:  This is a 60 year old gentleman with type 1 diabetes, renal cell cancer with past history of metastases which have been stable recently with one kidney and with subsequent obstruction of the ureter and acute renal failure.  He will be taken to the operating room with hope for relief of the obstruction.  We will admit him to the step-down level and we will hydrate aggressively to try to match any postobstructive diuresis.  We will reduce his blood pressure medicines and discontinue his Diovan HCTZ, minoxidil, and amlodipine for the time being.  We will also hold his lovastatin.  We will continue his Creon as well as his insulin.  We will give him low- dose insulin NovoLog with each meal as well as Levemir at a reduced dose of 4 units subcu twice a day.  We will follow his BUN and creatinine closely and we will ask Nephrology to see him if he is having difficulty with his volume status or his kidney function worsening.  Full code status.     Kharis Lapenna A. Joylene Draft, M.D.     MAP/MEDQ  D:  10/26/2010  T:  10/26/2010  Job:  [id number]  Electronically Signed by Crist Infante M.D. on 10/31/2010 09:32:16 AM

## 2010-11-01 LAB — GLUCOSE, CAPILLARY: Glucose-Capillary: 207 mg/dL — ABNORMAL HIGH (ref 70–99)

## 2010-11-05 NOTE — Discharge Summary (Signed)
Dylan Jefferson, Dylan Jefferson             ACCOUNT NO.:  [id number]  MEDICAL RECORD NO.:  [id number]           PATIENT TYPE:  I  LOCATION:  [id number]                         FACILITY:  [facility]  PHYSICIAN:  Ermalene Searing. Philip Aspen, M.D.DATE OF BIRTH:  Aug 20, 1950  DATE OF ADMISSION:  10/26/2010 DATE OF DISCHARGE:  10/31/2010                              DISCHARGE SUMMARY   CHIEF COMPLAINT:  Inability to urinate.  HISTORY OF PRESENT ILLNESS:  The patient is a pleasant 60 year old man with a complicated medical history as described below.  He had just been seen in our office within the past week.  On Friday morning, he tripped and fell against the door knob, hitting his left flank region.  He sat on the toilet for a few minutes and then was gradually able to stand and resume normal activity.  He noted over the ensuing several hours, continued pain in the area, and inability to urinate at all.  He presented to the emergency room where their evaluation showed an obstructing kidney stone and his remaining left kidney as he is status post right nephrectomy because of renal cell carcinoma.  In the emergency room, his creatinine was over 7 with a baseline of 1.0.  He was taken to the operating room that evening and we were asked to admit him due to acute renal failure.  PAST MEDICAL HISTORY: 1. Hypertension. 2. Peripheral vascular disease. 3. Renal cell carcinoma, status post right nephrectomy in 1989. 4. Severe osteoarthritis, which is generalized. 5. Diabetes mellitus, type 1, which is under good control. 6. Esophageal variceal bleed. 7. Thyroid cancer. 8. Hypertension. 9. Hyperlipidemia.  He also has had a history of lumbar fusion in 1996, right arm lipoma resection in 1999, May 2001, total pancreatectomy, splenectomy, gastroduodenoscopy, and cholecystectomy and then he had a subtotaled gastrectomy in February 2005.  He also has a history of hypothyroidism.  ALLERGIES:  No known drug  allergies.  MEDICATIONS PRIOR TO ADMISSION: 1. Colace 100 mg daily p.r.n. constipation. 2. Diovan HCT 160/12.5 one tablet p.o. daily. 3. Iron sulfate 325 mg p.o. t.i.d. 4. Geritol complete 1 tablet p.o. daily. 5. Apidra 4-7 units subcutaneous with each meal p.r.n. 6. Levemir insulin 6 units subcutaneous twice daily. 7. Lovastatin 40 mg p.o. daily. 8. Pepcid 20 mg p.o. t.i.d. 9. Synthroid 200 mcg p.o. daily. 10.Vitamin B12 tablet once daily. 11.Creon 6000 units, take a total 14 capsules daily before meals. 12.Atenolol 100 mg p.o. daily. 13.Diprolene cream to skin rash p.r.n. 14.Tramadol 50 mg p.o. q.i.d. p.r.n. moderate pain. 15.Vicodin 5/500 p.o. t.i.d. p.r.n. severe pain. 16.Sorbitol 2 tablespoons p.o. daily p.r.n. constipation. 17.Amlodipine 5 mg p.o. daily. 18.Minoxidil 10 mg p.o. daily.  SOCIAL HISTORY:  He has been married for over 88 years and has 1 daughter.  He is a retired and disabled.  He is a nonsmoker and nondrinker.  REVIEW OF SYSTEMS:  He has generally felt well other than having chronic arthralgias.  He had been eating and drinking well and denied symptoms of shortness of breath, chest pain, or palpitations.  INITIAL PHYSICAL EXAMINATION:  VITAL SIGNS:  Blood pressure 93/51, pulse 49, respirations 18, temperature 97.9,  pulse oxygen saturation was 96% on room air. GENERAL:  He is a well-nourished, well-developed white man who is in no apparent distress while lying supine. HEAD, EYES, EARS, NOSE, and THROAT:  Within normal limits. NECK:  Supple without jugular venous distention. CHEST:  Clear to auscultation. HEART:  Regular rate and rhythm without significant murmur or gallop. ABDOMEN:  Normal bowel sounds and no hepatosplenomegaly or tenderness. EXTREMITIES:  Without cyanosis, clubbing, or edema.  INITIAL LABORATORY STUDIES:  White blood cell count was 15, platelets 185, hemoglobin 14.  There were 69% segs.  Glucose was 259.  Serum sodium 130, potassium  4.0, chloride 99, carbon dioxide 22, BUN 52, creatinine 7.51, calcium 7.4, GFR was 7.  IMAGING STUDIES:  A CT scan of the abdomen and pelvis done without IV contrast showed too stable left lower lobe pulmonary nodules that measured 5 to 6.5 mm in size and a 5 x 7 mm stone in the midleft ureter at the level of the L4/L5 disk space.  The stone causes moderate left hydroureteronephrosis.  There was also perinephric and periureteric stranding.  There was a 4 mm stone in the lower pole of the left kidney. Left rib and chest x-ray showed no evidence of acute cardiopulmonary abnormality and no evidence for rib fracture.  HOSPITAL COURSE:  On the day of admission, he was taken to the operating room by Dr. Carolan Clines and he performed a cystourethroscopy, left retrograde pyelogram, left ureteroscopy, and laser fragmentation of ureteral stone with basket extraction of the stone.  This was done without immediate complication.  The patient was given high-dose IV fluids as he developed a postobstructive diuresis.  His electrolytes were monitored closely with electrolyte replacement as needed. Gradually, his urine output normalized and his renal function also normalized.  PROCEDURES:  CT scan of the abdomen and pelvis without IV contrast and urologic surgery as described above.  COMPLICATIONS:  None.  CONDITION ON DISCHARGE:  He felt great and been eating well, having no nausea or vomiting.  Most recent physical exam showed, VITAL SIGNS:  Blood pressure 165/73, pulse 51, respirations 16, temperature 97.5, pulse oxygen saturation was 93% on room air. GENERAL:  He is a well-nourished, well-developed white man who is in no apparent distress. CHEST:  Clear to auscultation. HEART:  Regular rate and rhythm. ABDOMEN:  Normal bowel sounds and no tenderness. EXTREMITIES:  Without cyanosis, clubbing, or edema.  MOST RECENT LABORATORY STUDIES:  Serum sodium 140, potassium 3.8, chloride 105,  carbon dioxide 31, BUN 6, creatinine 0.92, glucose 163, total protein 5.3, albumin 2.8.  White blood cell count was 6.9, hemoglobin 12, hematocrit 36, platelets 224.  Most recent capillary blood glucose levels were 107, 277, 137.  DISCHARGE DIAGNOSES: 1. Acute ureteral obstruction causing acute renal failure, normalized     following surgical relief of obstruction. 2. Nephrolithiasis. 3. Protein-calorie malnutrition. 4. Anemia. 5. Hypertension. 6. Atherosclerotic peripheral vascular disease. 7. In 1989, diagnosis of renal cell carcinoma, status post right     nephrectomy. 8. Severe generalized osteoarthritis. 9. Diabetes mellitus, type 1, controlled. 10.Esophageal variceal bleed. 11.Thyroid cancer, status post thyroidectomy. 12.Hypertension. 13.Hyperlipidemia. 14.Chronic low back pain. 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 16.In 2005, subtotal gastrectomy. 17.Hyperlipidemia. 18.Gastroesophageal reflux. 19.Pancreatic insufficiency. 20.Gastroesophageal reflux.  DISCHARGE MEDICATIONS: 1. Amlodipine 5 mg p.o. daily. 2. Apidra insulin.  He is to use his sliding scale coverage per usual     plan. 3. Atenolol 100 mg p.o. daily. 4. Claritin 10 mg p.o. daily. 5. Creon 6000 units, take  4 capsules with breakfast, 2 capsules with     early lunch, 4-6 capsules with snack, and 4 capsules with supper     daily. 6. Diovan HCT 160/12.5 one tablet p.o. daily. 7. Famotidine 20 mg p.o. t.i.d. 8. Iron sulfate 325 mg, take 1 tablet p.o. t.i.d. 9. Vicodin 5/500, take 1 tablet p.o. q.i.d. p.r.n. moderate pain. 10.Levemir insulin 6 units subcutaneous twice daily. 11.Levothyroxine 200 mcg p.o. once daily. 12.Lovastatin 40 mg p.o. daily. 13.Minoxidil 10 mg take one-half to 1 full tablet daily. 14.Multivitamin 1 tablet p.o. daily. 15.Neosporin to abrasion 4 times daily as needed. 16.Tramadol 50 mg p.o. q.i.d. p.r.n. mild to moderate pain. 17.Vitamin C over the counter 1 tablet p.o.  daily.  DISPOSITION AND FOLLOWUP:  The patient should have a followup visit with Dr. [name] at [facility] within 2 weeks of discharge.  He should see Dr. Gaynelle Arabian for followup per his recommendations.          ______________________________ Ermalene Searing Philip Aspen, M.D.     DGP/MEDQ  D:  10/31/2010  T:  11/01/2010  Job:  [id number]  cc:   Pierre Bali I. Gaynelle Arabian, M.D. Fax: [phone]  Earnstine Regal, MD [phone] [address]  Electronically Signed by [name] M.D. on 11/05/2010 09:17:55 AM

## 2010-11-14 LAB — BASIC METABOLIC PANEL
BUN: 10 mg/dL (ref 6–23)
Chloride: 102 mEq/L (ref 96–112)
Creatinine, Ser: 1.05 mg/dL (ref 0.4–1.5)

## 2010-11-14 LAB — POCT HEMOGLOBIN-HEMACUE: Hemoglobin: 15.1 g/dL (ref 13.0–17.0)

## 2010-11-19 LAB — GLUCOSE, CAPILLARY
Glucose-Capillary: 185 mg/dL — ABNORMAL HIGH (ref 70–99)
Glucose-Capillary: 221 mg/dL — ABNORMAL HIGH (ref 70–99)
Glucose-Capillary: 251 mg/dL — ABNORMAL HIGH (ref 70–99)
Glucose-Capillary: 295 mg/dL — ABNORMAL HIGH (ref 70–99)
Glucose-Capillary: 335 mg/dL — ABNORMAL HIGH (ref 70–99)

## 2010-11-19 LAB — CBC
HCT: 43.7 % (ref 39.0–52.0)
MCV: 101.4 fL — ABNORMAL HIGH (ref 78.0–100.0)
MCV: 102.4 fL — ABNORMAL HIGH (ref 78.0–100.0)
Platelets: 176 10*3/uL (ref 150–400)
Platelets: 225 10*3/uL (ref 150–400)
RDW: 12.6 % (ref 11.5–15.5)
WBC: 10.1 10*3/uL (ref 4.0–10.5)
WBC: 12.5 10*3/uL — ABNORMAL HIGH (ref 4.0–10.5)

## 2010-11-19 LAB — COMPREHENSIVE METABOLIC PANEL
Albumin: 3.9 g/dL (ref 3.5–5.2)
BUN: 31 mg/dL — ABNORMAL HIGH (ref 6–23)
CO2: 23 mEq/L (ref 19–32)
Chloride: 101 mEq/L (ref 96–112)
Creatinine, Ser: 2.81 mg/dL — ABNORMAL HIGH (ref 0.4–1.5)
GFR calc non Af Amer: 23 mL/min — ABNORMAL LOW (ref >60)
Total Bilirubin: 2.3 mg/dL — ABNORMAL HIGH (ref 0.3–1.2)

## 2010-11-19 LAB — DIFFERENTIAL
Basophils Absolute: 0.1 10*3/uL (ref 0.0–0.1)
Lymphocytes Relative: 15 % (ref 12–46)
Neutro Abs: 8.7 10*3/uL — ABNORMAL HIGH (ref 1.7–7.7)
Neutrophils Relative %: 70 % (ref 43–77)

## 2010-11-19 LAB — BASIC METABOLIC PANEL
BUN: 24 mg/dL — ABNORMAL HIGH (ref 6–23)
BUN: 29 mg/dL — ABNORMAL HIGH (ref 6–23)
Chloride: 102 mEq/L (ref 96–112)
Creatinine, Ser: 2.18 mg/dL — ABNORMAL HIGH (ref 0.4–1.5)
GFR calc non Af Amer: 31 mL/min — ABNORMAL LOW (ref >60)
Potassium: 3.8 mEq/L (ref 3.5–5.1)
Sodium: 133 mEq/L — ABNORMAL LOW (ref 135–145)

## 2010-11-19 LAB — LIPASE, BLOOD: Lipase: 11 U/L (ref 11–59)

## 2010-12-17 NOTE — Op Note (Signed)
NAMEGURSHAN, Dylan Jefferson             ACCOUNT NO.:  [id number]   MEDICAL RECORD NO.:  [id number]          PATIENT TYPE:  INP   LOCATION:  [id number]                         FACILITY:  [facility]   PHYSICIAN:  Raynelle Bring, MD      DATE OF BIRTH:  1951/05/09   DATE OF PROCEDURE:  09/06/2008  DATE OF DISCHARGE:                               OPERATIVE REPORT   PREOPERATIVE DIAGNOSES:  1. History of right nephrectomy for renal cell carcinoma.  2. Left ureteral calculus.  3. Acute renal failure.   POSTOPERATIVE DIAGNOSES:  1. History of right nephrectomy for renal cell carcinoma.  2. Left ureteral calculus.  3. Acute renal failure.   OPERATION PERFORMED:  1. Cystoscopy.  2. Left retrograde pyelography.  3. Left ureteral stent placement (6 x 24).   SURGEON:  Raynelle Bring, MD.   ANESTHESIA:  General.   COMPLICATIONS:  None.   INDICATIONS FOR PROCEDURE:  [name] is a 60 year old gentleman with  a history of renal cell carcinoma status post a right nephrectomy.  He  presented to the emergency department this evening with severe left-  sided flank pain and was found to have an obstructing left ureteral  calculus and acute renal failure with a serum creatinine of 2.8.  He was  therefore counseled regarding options and elected to proceed with the  above procedures.  The potential risks, complications and alternative  options associated with the above procedures were discussed with the  patient in detail and informed consent was obtained.   DESCRIPTION OF PROCEDURE:  The patient was taken to the operating room  and an LMA was placed.  He was administered preoperative antibiotics,  placed in the dorsal lithotomy position, and prepped and draped in the  usual sterile fashion. Next, preoperative time out was performed.  Cystourethroscopy was then performed with demonstrated a normal anterior  and posterior urethra.  The bladder was inspected and there was no  evidence of any bladder tumors,  stones, or other mucosal pathology.  The  left ureteral orifice was identified in the normal anatomic position and  was intubated with a 6 French ureteral catheter.  Contrast was injected  and there was noted to be a complete obstruction of the ureter in the  proximal midureter due to a larger radiopaque calculus consistent with  the patient's known calculus on CT imaging.  A 0.038 Sensor guidewire  was then advanced through the ureteral catheter and up the left ureter  past the stone and into the left renal pelvis.  This 6 French ureteral  catheter was then advanced by the wire with some mild difficulty past  the stone and up in to the renal pelvis.  Contrast was injected which  confirmed proper placement in the left renal pelvis.  The wire was then  replaced and the ureteral catheter was removed.  A 6 x 24 double-J  ureteral stent was then advanced over the wire using Seldinger technique  and appropriately positioned under fluoroscopic and cystoscopic  guidance.  The wire was removed with a good coil noted in the renal  pelvis as well as in  the bladder.  The patient's bladder was then  emptied.  He tolerated the procedure well and without complications.  He  will be admitted to the hospital for further monitoring of his renal  function.      Raynelle Bring, MD  Electronically Signed     LB/MEDQ  D:  09/07/2008  T:  09/07/2008  Job:  [id number]

## 2010-12-17 NOTE — Discharge Summary (Signed)
NAMEJASUN, Dylan Jefferson             ACCOUNT NO.:  [id number]   MEDICAL RECORD NO.:  [id number]          PATIENT TYPE:  INP   LOCATION:  1418                         FACILITY:  [facility]   PHYSICIAN:  Raynelle Bring, MD      DATE OF BIRTH:  Apr 25, 1951   DATE OF ADMISSION:  09/06/2008  DATE OF DISCHARGE:  09/07/2008                               DISCHARGE SUMMARY   ADMISSION DIAGNOSES:  1. History of renal cell carcinoma status post right nephrectomy.  2. Acute renal failure.  3. Left ureteral obstruction secondary to left ureteral calculus with      solitary left kidney.   DISCHARGE DIAGNOSES:  1. History of renal cell carcinoma status post right nephrectomy.  2. Acute renal failure.  3. Left ureteral obstruction secondary to left ureteral calculus with      solitary left kidney.  4. Hyperglycemia secondary to diabetes.   PROCEDURES:  Cystoscopy with left ureteral stent placement.   HISTORY AND PHYSICAL:  For full details, please see admission history  and physical.  Briefly, Dylan Jefferson is a 59 year old gentleman with a  left solitary kidney status post right nephrectomy due to a history of  renal cell carcinoma.  He presented to the emergency department with  severe left-sided flank pain and acute renal failure with a serum  creatinine of 2.8.  He had been anuric for the previous 12 to 24 hours.  He underwent a CT scan which demonstrated a 1.2-cm, mid/proximal  ureteral calculus.  After discussion with the patient, it was decided to  proceed with urgent ureteral stent placement to help resolve his renal  dysfunction.   HOSPITAL COURSE:  On the evening of September 06, 2008, the patient was  taken to the operating room and underwent cystoscopy and left ureteral  stent placement.  This was uncomplicated and the patient was  subsequently monitored.  His pain was significantly improved after  ureteral stent placement and his creatinine the following morning had  begun to improve to  2.18.  He was monitored throughout the day and was  able to resume his diet.  He did have hyperglycemia and was maintained  on a sliding scale during his hospitalization.  His serum creatinine on  the afternoon of September 07, 2008, had continued to decrease to 1.78.  At this time, he was felt to be stable for discharge home.   DISPOSITION:  Home.   DISCHARGE MEDICATIONS:  He was instructed to resume his regular home  medications including his usual insulin schedule for management of his  diabetes.  He did have hydrocodone at home and was told to take this as  needed for pain or discomfort.   DISCHARGE INSTRUCTIONS:  He was instructed to resume activities  tolerated and to resume his normal diabetic diet.   FOLLOWUP:  He did have a KUB performed during his hospitalization which  demonstrated his stone to be very like radiopaque.  We discussed  treatment options including shock wave lithotripsy versus ureteroscopic  laser lithotripsy and the pros and cons of each of these approaches.  At  this time, the patient is  leaning towards shock  wave lithotripsy.  I have recommended that he follow up in approximately  7 to 10 days to ensure that his acute renal failure has completely  resolved prior to proceeding with definitive stone treatment.  In  addition, he will eventually require a metabolic evaluation to try to  decrease his risk for stone formation considering his solitary kidney.      Raynelle Bring, MD  Electronically Signed     LB/MEDQ  D:  09/07/2008  T:  09/08/2008  Job:  [id number]   cc:   Eston Esters, MD  Fax: [phone]

## 2010-12-17 NOTE — Consult Note (Signed)
NAMEJUA, KIKER             ACCOUNT NO.:  [id number]   MEDICAL RECORD NO.:  [id number]          PATIENT TYPE:  INP   LOCATION:  [id number]                         [facility]ACILITY:  [facility]   PHYSICIAN:  Raynelle Bring, MD      DATE OF BIRTH:  [birth year]   DATE OF CONSULTATION:  09/06/2008  DATE OF DISCHARGE:                                 CONSULTATION   CHIEF COMPLAINT:  Kidney stone and acute renal failure.   HISTORY:  Mr. Litherland is a 60 year old gentleman seen in consultation  at the request of Dr. Delora Fuel and [place], [place].  He has a  history of renal cell carcinoma status post a right nephrectomy in 1991.  He has had multiple metastatic recurrences since 2001 and has undergone  multiple surgical treatments for isolated metastases including a total  pancreatectomy, splenectomy, partial gastrectomy and thyroidectomy.  His  most recent recurrence was in 2005 and he has had no evidence of disease  recurrence since that time.  He is followed by Dr. Eston Esters who is  his primary medical oncologist.  He presented to the emergency  department tonight with severe left-sided flank pain.  He does have a  history of kidney stones and felt like this was a kidney stone.  He  denied associated fever or hematuria.  He did have nausea.  He had been  anuric throughout the day today.  Due to the severe pain, presented to  the emergency department and underwent a CT scan which demonstrated a  1.2 cm proximal/mid left ureteral calculus along with an additional 7-mm  nonobstructing left lower pole kidney stone with associated  hydronephrosis.  His serum creatinine was found to be elevated to 2.81  and his baseline was reportedly normal.  Again, he does have a history  of urolithiasis, but apparently has never undergone a metabolic  evaluation and has not had a recent urologic evaluation or followup.  Currently, his pain is controlled with Dilaudid.  He presented tonight  with severe left flank  pain with radiation to his left lower quadrant.   PAST MEDICAL HISTORY:  1. Renal cell carcinoma.  2. Diabetes.  3. Hypertension.  4. Arthritis.  5. Hypothyroidism.  6. Gastroesophageal reflux disease.   PAST SURGICAL HISTORY:  1. Right radical nephrectomy.  2. Partial gastrectomy.  3. Thyroidectomy.  4. Total pancreatectomy.  5. Cholecystectomy.  6. Splenectomy.  7. Gastrojejunostomy with choledochojejunostomy.   MEDICATIONS:  1. Insulin.  2. Synthroid.  3. Pancrease.  4. Atenolol.  5. Pepcid.  6. Iron.  7. Lovastatin.  8. Calcium.  9. Diovan.   ALLERGIES:  IV CONTRAST.   FAMILY HISTORY:  There is a maternal and paternal history of  nephrolithiasis.  There is no history of GU malignancy in the family.   SOCIAL HISTORY:  He does have a history of tobacco use, although quit  approximately 35 years ago.   REVIEW OF SYSTEMS:  A complete review of systems was performed.  Pertinent positives include nausea and some mild upper extremity edema.  Pertinent negatives include no shortness of breath, chest pain or lower  extremity edema.  He denies any recent fever.   PHYSICAL EXAMINATION:  VITAL SIGNS:  Temperature 97.3, pulse 56,  respirations 16, blood pressure 145/103.  CONSTITUTIONAL:  Age appropriate male in no acute distress.  CARDIOVASCULAR:  Regular rate and rhythm without obvious murmurs.  LUNGS:  Clear bilaterally.  NECK:  No JVD or neck masses.  HEENT:  Normocephalic, atraumatic.  ABDOMEN:  Soft and nondistended with moderate left CVA tenderness with  radiation to his left lower quadrant.  He has no rebound tenderness or  guarding.  GU:  Normal male external genitalia.  EXTREMITIES:  No lower extremity edema.  He does have some mild edema of  his hands bilaterally.  NEUROLOGIC:  Grossly intact.   LABORATORY DATA:  Serum electrolytes are within normal limits.  Serum  creatinine is 2.81.  White blood count of 12.5, hemoglobin 14.4,  platelets 225.    RADIOLOGY IMAGINING:  I independently reviewed his CT scan which  demonstrates a 1.2 cm proximal/mid ureteral calculus with left  hydronephrosis.  He also has a 7-mm nonobstructing lower pole left renal  calculus of his solitary left kidney.   IMPRESSION:  Left ureteral stone with a solitary left kidney and  resultant acute renal failure.   PLAN:  He will be taken to the operating room for cystoscopy and left  ureteral stent placement urgently tonight.  We have discussed the risks,  benefits, and potential complications associated with this procedure.  Informed consent  was obtained.  He will then be admitted to the hospital to monitor his  renal function and will require definitive management of his stone  disease at a later date.  In addition, he will require a metabolic  evaluation to prevent future recurrences of his stone disease  considering his solitary kidney and history of nephrolithiasis.      Raynelle Bring, MD  Electronically Signed     LB/MEDQ  D:  09/06/2008  T:  09/07/2008  Job:  [id number]   cc:   Delora Fuel, MD  [address]  [place], [place] [address]   [place], [place]   Eston Esters, MD  Fax: [phone]

## 2010-12-17 NOTE — Assessment & Plan Note (Signed)
[place] OFFICE NOTE   NAME:Lorio, KILLION HOMRICH                    MRN:          [id number]  DATE:02/10/2007                            DOB:          [birth year]    PROBLEM:  Abdominal pain.   Mr. Gane has returned for re-evaluation. Approximately a month ago,  he developed pain around his incision site, while working in the yard.  The pain was described as rather sharp. The pain was intermittent and  lasted for about 2 or 3 days. Since that time, he has felt well. The  patient thinks his pain was from some staples in his abdomen. He  subsequently underwent CT, PET scan, and MRI. He has a history of  metastatic renal cell carcinoma. He underwent a Whipple in 2005 because  of involvement of his stomach. He has staple nodules. Scans including  PET scan and MRI did not reveal evidence for recurrent disease. He was  last colonoscoped in 2003. Recent stool Hemoccult was negative and  hemoglobin was 14.   PAST MEDICAL HISTORY:  Is as stated above. He also has hypertension,  diabetes, and arthritis.   FAMILY HISTORY:  Noncontributory.   MEDICATIONS:  Creon, Levothyroxine, Atenolol, Lovastatin, __________,  Dyazide, iron, insulin.   SOCIAL HISTORY:  He smokes rarely. He is married and is on disability.   REVIEW OF SYSTEMS:  Positive for joint and back pain and fatigue.   PHYSICAL EXAMINATION:  VITAL SIGNS:  Pulse 64, blood pressure 118/74,  weight 184.  HEENT: EOMI. PERRLA. Sclerae are anicteric.  Conjunctivae are pink.  NECK:  Supple without thyromegaly, adenopathy or carotid bruits.  CHEST:  Clear to auscultation and percussion without adventitious  sounds.  CARDIAC:  Regular rhythm; normal S1 S2.  There are no murmurs, gallops  or rubs.  ABDOMEN:  Bowel sounds are normoactive.  Abdomen is soft, non-tender and  non-distended.  There are no abdominal masses, tenderness, splenic  enlargement or  hepatomegaly.  EXTREMITIES:  Full range of motion.  No cyanosis, clubbing or edema.  RECTAL:  Deferred.   IMPRESSION:  1. Probable abdominal wall pain. At this point, there is no evidence      for recurrent renal cell carcinoma. Recommendations:  No further GI      workup for this problem. I will discuss with      Dr. Truddie Coco whether he feels that surveillance endoscopy and      colonoscopy are merited.  2. 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     Sandy Salaam. Deatra Ina, MD,FACG  Electronically Signed    RDK/MedQ  DD: 02/10/2007  DT: 02/10/2007  Job #: [id number]   cc:   Eston Esters, M.D.  Ermalene Searing Philip Aspen, M.D.

## 2010-12-17 NOTE — Op Note (Signed)
NAMEKRUZ, GARR             ACCOUNT NO.:  [id number]   MEDICAL RECORD NO.:  [id number]          PATIENT TYPE:  AMB   LOCATION:  NESC                         FACILITY:  [facility]   PHYSICIAN:  Raynelle Bring, MD      DATE OF BIRTH:  [birth year]   DATE OF PROCEDURE:  10/12/2008  DATE OF DISCHARGE:                               OPERATIVE REPORT   PREOPERATIVE DIAGNOSES:  1. Left ureteral and renal calculi.  2. Solitary left kidney.   POSTOPERATIVE DIAGNOSES:  1. Left ureteral and renal calculi.  2. Solitary left kidney.   PROCEDURES:  1. Cystoscopy.  2. Left ureteroscopy with laser lithotripsy and stone removal.  3. Left ureteral stent placement (6 x 24).   SURGEON:  Dr. Raynelle Bring.   ANESTHESIA:  LMA.   SPECIMEN:  Kidney stones.   DISPOSITION OF SPECIMENS:  These were then given to the patient's wife.   ESTIMATED BLOOD LOSS:  Minimal.   INDICATIONS:  Mr. Dame is a 60 year old gentleman with a solitary  left kidney, status post right radical nephrectomy for renal cell  carcinoma.  He recently presented with acute renal failure and an  obstructing left ureteral calculus.  He underwent placement of the left  ureteral stent and then was treated with extracorporeal shockwave  lithotripsy.  This did fragment the stone, although a sizable fragment  was still remaining.  It was therefore decided to proceed with more  definitive therapy with ureteroscopic laser lithotripsy.  The potential  risks, complications, and alternative treatment options associated with  the above procedures were discussed in detail and informed consent was  obtained.   DESCRIPTION OF PROCEDURE:  The patient was taken to the operating room  and anesthesia was induced.  He was given preoperative antibiotics,  placed in the dorsal lithotomy position, and prepped and draped in the  usual sterile fashion.  Next, a preoperative timeout was performed.  Cystourethroscopy was then performed with a  22-French cystoscope sheath.  Examination of the urethra demonstrated no abnormalities.  On inspection  of the bladder, he did have a left ureteral stent which was identified.  It did appear to be moderately encrusted.  The flexible graspers were  used to bring the stent out to the urethral meatus and it was attempted  to be intubated with an 0.038 sensor guidewire.  However, due to the  encrustation of the stent, the wire could not be passed.  The stent was  then removed under fluoroscopic guidance and was removed without  difficulty.  A new 0.038 sensor guidewire was then placed through the  cystoscope up into the left ureter and left renal pelvis under  fluoroscopic guidance.  It was able to bypass the ureteral calculus in  the proximal ureter without difficulty.  A 12/14 ureteral access sheath  was then advanced over the wire up to the level of the proximal ureteral  calculus.  The calculus was then fragmented with the 200 micron holmium  laser fiber at settings of 0.8 joules and 6 Hz.  Once adequate  fragmentation was performed, the stones were then removed with the  nitinol basket.  Ureterorenoscopy was then performed and there were no  further ureteral calculi.  In the renal collecting system, there was  noted to be a stone in the lower pole, as well as a stone fragment in a  interpolar calix.  These stones were fragmented appropriately and the  fragments were then all removed with a nitinol basket.  Once the patient  was stone free, which was determined after careful inspection of the  entire renal collecting system, the ureteroscope was removed.  An 0.038  sensor guidewire was then advanced back up into the left renal pelvis  and the ureteral access sheath was removed.  The wire was back loaded on  the cystoscope sheath and a 6 x 24 double-J ureteral stent was advanced  over the wire using Seldinger technique.  The wire was removed with a  good curl noted in the renal pelvis as  well as in the bladder.  The  patient's bladder was emptied.  A string tether was left on the stent  for the patient to be able to remove the stent himself in a few days.  He tolerated the procedure well and without complications and was able  to be transferred to the recovery unit in satisfactory condition.      Raynelle Bring, MD  Electronically Signed     LB/MEDQ  D:  10/12/2008  T:  10/12/2008  Job:  [id number]

## 2010-12-20 NOTE — Discharge Summary (Signed)
[place]. [facility]  Patient:    Dylan Jefferson, Dylan Jefferson                    MRN: [id number] Adm. Date:  [id number] Disc. Date: 02/24/01 Attending:  Eston Esters CC:         Eston Esters, M.D.  Earnstine Regal, M.D.  Ermalene Searing Philip Aspen, M.D.  Lowella Bandy. Olevia Perches, M.D. [facility]   Discharge Summary  CONDITION ON DISCHARGE:  Improved.  DIAGNOSES: 1. Admission with acute gastrointestinal bleed, likely due to a bleeding    jejunal ulcer, resolved at discharge. 2. Anemia secondary to acute gastrointestinal bleed and vitamin B12/iron    deficiency.    a. Initiation of ferrous sulfate and vitamin B12 supplements during this       hospital admission. 3. Metastatic renal cell carcinoma.    a. Diagnosed at the time of pancreatectomy May 2001.    b. Initial diagnosis 1989, with a right nephrectomy. 4. Osteoarthritis. 5. Surgically induced diabetes mellitus.  PROCEDURES: 1. Transfusion of packed red blood cells. 2. Upper endoscopy.  CONSULTATIONS:  Drs. Kaplan/Brodie, GI.  HISTORY OF PRESENT ILLNESS:  Mr. Buetow is a 60 year old with a history of metastatic renal cell carcinoma.  He was diagnosed with renal cell cancer when he presented with a pancreatic head mass in May 2001, and underwent a total pancreatectomy/gastrojejunostomy with a splenectomy at that time.  Pathology confirmed metastatic renal cell carcinoma involving the pancreas and peripancreatic lymph nodes.  He has been without disease progression since undergoing surgery in May 2001, including a recent negative restaging evaluation.  He presented to the emergency room on the evening of February 21, 2001, with presyncope.  He was noted to have dark Hemoccult-positive stool in the emergency room, and he was admitted for further evaluation when the admission hemoglobin returned low at 8.1.  On review of his medical chart, a previous hemoglobin had been measured at 11.2 on February 10, 2001, as compared to a  level of 15.1 on September 15, 2000.  HOSPITAL COURSE:  Dr. Deatra Ina was consulted, and he was taken for an upper endoscopy procedure on February 22, 2001.  At the time of endoscopy he was found to have a jejunal ulcer that was not actively bleeding.  There was no active bleeding source.  There was a question of small varices.  These were at the distal esophagus.  He was transfused with packed red cells on February 21, 2001, when the hemoglobin fell to 7.3.  His hemoglobin remained stable thereafter.  He had several bouts of melena on July 21 and February 22, 2001, but over the last two days of his hospital admission, he had no recurrent bleeding.  Review of the peripheral blood smear was remarkable for postsplenectomy changes and targeting.  A ferritin returned low at 4, and a vitamin B12 also returned low at 145.  It is presumed that he has anemia in part due to chronic GI blood loss and malabsorption of iron and vitamin B12 related to the surgery last year.  He was started on ferrous sulfate and vitamin B12 prior to discharge.  On the morning of February 24, 2001, he appeared stable for discharge.  He was continued on insulin sliding scale throughout this hospital admission.  DISCHARGE MEDICATIONS: 1. Lantus insulin 17 units q.p.m. 2. Ferrous sulfate 325 mg t.i.d. 3. Pancrease before meals. 4. Pepcid 20 mg t.i.d. 5. Humalog insulin sliding scale as before admission. 6. Vitamin B12 1000 mcg subcutaneous  monthly.  FOLLOW-UP:  With Dr. Delfin Edis on April 16, 2001.  An outpatient colonoscopy will be arranged.  A follow-up appointment will be arranged with Dr. Truddie Coco within the next one week.  DISCHARGE INSTRUCTIONS:  He is to call for recurrent bleeding. DD:  02/24/01 TD:  02/25/01 Job: [id number]

## 2010-12-20 NOTE — Discharge Summary (Signed)
[place]. [facility]  Patient:    Dylan Jefferson, Dylan Jefferson                    MRN: [id number] Adm. Date:  [id number] Disc. Date: [id number] Attending:  Gomez Cleverly CC:         Ermalene Searing. Philip Aspen, M.D.             Eston Esters, M.D.             Earnstine Regal, M.D. at office             Lucina Mellow. Terance Hart, M.D.             Thomas B. March Rummage, M.D.                           Discharge Summary  REASON FOR ADMISSION:  Abnormal pancreas, rule out neoplasm, cholelithiasis, abdominal pain.  BRIEF HISTORY:  Patient is a 60 year old white male who initially presented to Dr. Aaron Edelman in my practice on referral from [facility] with abnormal liver function tests, elevated pancreatic enzymes, and abdominal pain.  Ultrasound demonstrated a thick-walled gallbladder containing stones. Biliary tree was without dilatation.  CT scan of the abdomen was obtained which showed an abnormality of the body and tail of the pancreas, suspicious for neoplasm.  MRI scan was also obtained.  CA19-9 level was minimally elevated at 34.7.  Patient was seen in consultation at my office and presented to the multi-disciplinary cancer conference at [facility].  The patient was then prepared and admitted for exploratory laparotomy and pancreatic resection.  HOSPITAL COURSE:  Patient was admitted on Dec 17, 1999.  He was taken to the operating room where he underwent exploratory laparotomy.  The patient was found to have metastatic renal cell carcinoma to the pancreas, cholelithiasis, choledocholithiasis, and chronic cholecystitis.  The patient underwent total pancreatectomy with splenectomy, a reconstruction with gastrojejunostomy and choledochojejunostomy, and a cholecystectomy with intraoperative cholangiography.  Patients postoperative course was remarkably straightforward.  He was seen in consultation immediately by Dr. [name].  Dr. Philip Aspen managed his  surgically-produced diabetes with excellent initial control.  The patient was managed in the intensive care unit.  He had gradual resolution of his ileus.  He was transferred to the general care surgical floor.  He was seen in consultation by Dr. Eston Esters from the medical oncology division.  He was also seen in consultation by the radiation oncologist.  Patient was also followed closely by Dr. Philip Aspen throughout his hospital stay.  He was advanced to a clear liquid diet by postoperative day #6.  He began full liquid diet on postoperative day #7.  He began taking a regular diabetic diet on postoperative day #8.  During his hospitalization he underwent bone scan and CT scan of the chest, both of which showed no evidence of any significant metastatic disease.  Patients insulin regimen was fine-tuned and he was prepared for discharge home on postoperative day #10.  DISCHARGE PLANNING:  Patient is discharged home today, Dec 27, 1999 in good condition, tolerating a regular diabetic diet, and having normal gastrointestinal function.  FOLLOW-UP:  He will be seen back in my office at [facility] in one week.  He will be seen by Dr. Philip Aspen at [facility] in five days.  Patient is comfortable with his diabetic diet and his insulin management.  DISCHARGE PRESCRIPTIONS:  NPH insulin, regular  insulin, Pancrease capsules, Vicodin for pain, and Pepcid.  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  CONDITION AT DISCHARGE:  Good. DD:  12/27/99 TD:  12/29/99 Job: [id number]

## 2010-12-20 NOTE — Op Note (Signed)
NAME:  Dylan Jefferson, Dylan Jefferson                       ACCOUNT NO.:  [id number]   MEDICAL RECORD NO.:  [id number]                   PATIENT TYPE:  AMB   LOCATION:  DAY                                  FACILITY:  [facility]   PHYSICIAN:  Earnstine Regal, M.D.                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   PROCEDURE:  Total thyroidectomy.   SURGEON:  Earnstine Regal, M.D.   ASSISTANT:  Fenton Malling. Lucia Gaskins, M.D.   ANESTHESIA:  General.   ESTIMATED BLOOD LOSS:  Minimal.   PREPARATION:  Betadine.   COMPLICATIONS:  None.   INDICATIONS:  The patient is a 60 year old white male with a history of  renal cell carcinoma, status post nephrectomy 14 years ago by Dr. Lucina Mellow.  Dylan Jefferson.  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  He underwent total pancreatectomy in May 2001.  The patient has  had subsequent close follow-up at the [facility].  He was  found on CT scan of the chest, abdomen and pelvis to have a 1 cm lesion in  the right thyroid lobe.  The patient underwent thyroid ultrasound.  Fine-needle aspiration showed atypical cells, suspicious for metastatic  renal cell carcinoma.  The patient now comes to surgery for thyroidectomy.   DESCRIPTION OF PROCEDURE:  The procedure is done in OR#1 at the [facility].  The patient was brought to the operating room and  placed in a supine position on the operating room table.  Following the  administer of general endotracheal anesthesia, the patient was prepped and  draped in the usual strict aseptic fashion.  After ascertaining that an  adequate level of anesthesia had been obtained, a Kocher incision was made  with  a #10 blade.  Dissection was carried down through the subcutaneous  tissues and platysma.  Hemostasis is obtained with electrocautery.  Skin  flaps are developed cephalad and caudad from the thyroid notch to the  sternal notch.  A May-Horner self-retaining retractor is placed for  exposure.  Strap muscles are incised in the midline.  Strap muscles are  elevated off the left thyroid lobe.  It appears normal and palpates normal  without nodularity.   Dissection is then begun on the right side.  Again, strap muscles are  elevated.  The right thyroid lobe is exposed.  There is a topical 1.5 cm  nodule in the inferior pole of the right thyroid lobe.  Middle thyroid vein  is divided between medium Ligaclips.  The gland is mobilized.  Inferior  venous tributaries are divided up between small Ligaclips.  The superior  pole vessels are isolated and ligated in continuity with 2-0 silk ties and  medium Ligaclips, then divided.  On rolling the gland anteriorly, there  appears to be a nonrecurrent laryngeal nerve on the right side.  The nerve  courses across the hypopharynx and across the cricothyroid ES muscle, to  insert at its normal location below the medial third of the cricothyroid ES  musculature.  This nerve is preserved.   The gland is rolled anteriorly.  Parathyroid tissue is identified and  preserved.  Branches of the inferior thyroid artery are divided between  small Ligaclips.  Ligament of Gwenlyn Found is transected using the electrocautery  for hemostasis.  The gland is rolled up and onto the anterior surface of the  trachea.   Next, we turned our attention back to the left thyroid lobe.  Again, strap  muscles are reflected laterally.  The middle thyroid vein is divided between  medium Ligaclips.  The gland is rolled further anteriorly.  Adventitial  tissue is gently dissected away with blunt dissection with the Kittner.  Inferior venous tributaries are divided between Ligaclips.  Superior  pole  vessels are dissected out and ligated in continuity with 2-0 silk ties and  medium Ligaclips, then divided.  The gland is rolled anteriorly.  The  parathyroid tissue is identified and preserved.  Recurrent laryngeal nerve  appears to be in its normal location on the left side; it is preserved.  Branches of the inferior thyroid artery are divided between small Ligaclips.  Ligament of Gwenlyn Found is transected with the electrocautery used for hemostasis.  The gland is rolled up and onto the anterior surface of the trachea, where  it is completely excised.  The sutures used to mark the right superior pole.  The right lobe is transected on the table, and shows approximately a 1.5 cm  well-circumscribed nodule containing heterogeneous purple-colored tissue.  This is submitted fresh to pathology, and Dr. Gari Crown reviewed the gross  pathology and will obtain photographs for the medical record.   The neck is irrigated with warm saline.  Good hemostasis is noted  bilaterally.  Surgicel is placed over the area of the recurrent nerves  bilaterally.  Strap muscles are reapproximated in the midline with  interrupted 3-0 Vicryl sutures.  The platysma is closed with interrupted 3-0  Vicryl sutures.  Skin edges are reapproximated with widely-spaced stainless-  steel staples, and interspaced 1/2-inch Steri-Strips.  Sterile dressings are  applied.  The patient is awakened from the anesthesia and brought to the  recovery room in stable condition.  The patient tolerated the procedure  well.                                               Earnstine Regal, M.D.    TMG/MEDQ  D:  01/06/2003  T:  01/06/2003  Job:  [id number]   cc:   Ermalene Searing. Philip Aspen, M.D.  [address]  [place]  [place] [address]  Fax: [place] Dylan Jefferson, M.D.  [place]. [address] [place]  [place] [address]  Fax: [phone]   Eston Esters, M.D.  [address] [place] [address]  Fax: [phone]

## 2010-12-20 NOTE — Consult Note (Signed)
[place]. [facility]  Patient:    Dylan Jefferson, Dylan Jefferson                    MRN: [id number] Adm. Date:  [id number] Attending:  Gomez Cleverly                          Consultation Report  REQUESTING PHYSICIAN:  Earnstine Regal, M.D.  REASON FOR CONSULTATION:  New diabetes mellitus, type 1, following a total pancreatectomy today.  HISTORY OF PRESENT ILLNESS:  Patient is a 60 year old white male who today had a total pancreatectomy, splenectomy, gastrojejunostomy, cholecystectomy, and choledochojejunostomy when an exploratory laparotomy revealed renal cell carcinoma infiltrating the pancreas.  He has a history of a 1989 right nephrectomy for a benign process, severe osteoarthritis effecting his shoulders, elbows, wrists, and knees, and a spring 2001 diagnosis of diabetes mellitus, type 2.  MEDICATIONS:  He was taking no medications on a regular basis other than Glucotrol XL one tab daily prior to admission.  Currently, he is on IV insulin, Pepcid 20 mg IV q.12h, and epidural anesthesia.  ALLERGIES:  No known drug allergies.  PAST SURGICAL HISTORY:  In 1989 right nephrectomy for a benign lesion, in 1996 lumbar spine fusion, remote history of tonsillectomy, and 1999 right arm lipoma resection.  FAMILY HISTORY:  Noncontributory.  SOCIAL HISTORY:  He has been married for 27 years and is a Hotel manager for [employer].  He denies tobacco or alcohol abuse.  REVIEW OF SYSTEMS:  Arthritis effecting many joints that is moderate severe. He currently has a sore throat and denies a cough.  PHYSICAL EXAMINATION:  VITALS:  Blood pressure 104/58, pulse 82, respiratory rate 20, temperature 97.0.  GENERAL:  He is a well-nourished, well-developed, white male who is lightly sedated but easily arousable and oriented x 4.  HEENT:  Pupils are equal, round, and reactive to light and accommodation. Extraocular movements are intact.  Throat is within normal  limits.  NECK:  Supple with full range of motion.  There is no jugular venous distension or carotid bruit.  CHEST:  Clear to auscultation.  HEART:  Regular rate and rhythm, S1, S2 without murmur, gallop, or rub.  ABDOMEN:  Markedly decreased bowel sounds with no evidence of rebound tenderness.  EXTREMITIES:  Without cyanosis, clubbing, or edema.  NEUROLOGICAL:  He is alert and oriented x 4.  Cranial nerves II-XII are intact.  Sensory exam is within normal limits.  He is able to move all four extremities without difficulty.  LABORATORY STUDIES:  White blood cell count 5.0, hemoglobin 15.8, hematocrit 44.4, platelet count 111.  Serum sodium 138, potassium 4.2, chloride 111, CO2 23, BUN 8, creatinine 1.2, glucose 187, calcium level 7.2, albumin level pending at time of dictation.  November 29, 1999, MRI of the abdomen was consistent with neoplasm involving the body and tail of the pancreas with possible involvement of the colon and spleen.  IMPRESSION: 1. New diabetes mellitus, type 1.  This is likely to be quite "brittle" due to    the lack to compensate for periods of hypoglycemia. 2. Exocrine pancreas enzyme deficiency. 3. Metastatic renal cell carcinoma with apparent metastases only to the    pancreas.  RECOMMENDATIONS: 1. I agree with plans for IV insulin for now in his perioperative period. 2. We will begin intensive diabetes mellitus (type 1) education as an    inpatient.  He will likely need to be on multiple  daily insulin injections    versus an insulin pump to maintain optimal blood sugar control. 3. When he begins eating again pancreatic enzyme replacement will be started. 4. We will check a serum albumin level to confirm that the low calcium level    is due to concurrent low serum albumin levels. DD:  12/17/99 TD:  12/18/99 Job: [id number]

## 2010-12-20 NOTE — Op Note (Signed)
NAME:  Dylan Jefferson, Dylan Jefferson                       ACCOUNT NO.:  [id number]   MEDICAL RECORD NO.:  [id number]                   PATIENT TYPE:  INP   LOCATION:  0164                                 FACILITY:  [facility]   PHYSICIAN:  Earnstine Regal, M.D.                DATE OF BIRTH:  02/19/1951   DATE OF PROCEDURE:  09/29/2003  DATE OF DISCHARGE:                                 OPERATIVE REPORT   PREOPERATIVE DIAGNOSIS:  Metastatic renal cell carcinoma.   POSTOPERATIVE DIAGNOSIS:  Metastatic renal cell carcinoma.   PROCEDURE:  Partial gastrectomy.   SURGEON:  Earnstine Regal, M.D.   ASSISTANT:  Haywood Lasso, M.D.   ANESTHESIA:  General.   ESTIMATED BLOOD LOSS:  500 cc.   PREPARATION:  Betadine.   COMPLICATIONS:  None.   INDICATIONS:  Patient is a 60 year old white male well known to my practice.  Patient has metastatic renal cell carcinoma dating from a right nephrectomy  performed in 1991.  Patient was admitted on the medical service with upper  GI bleeding.  He underwent an upper endoscopy which demonstrated a large  mass along the greater curvature of the stomach.  Biopsies showed metastatic  renal cell carcinoma.  Patient is now brought to the operating room for  resection of gastric tumor for control of hemorrhage.   The procedure is done in OR #11 at [facility].  Patient  is brought to the operating room and placed in a supine position on the  operating room table.  After administration of general anesthesia, the  patient is prepped and draped in the usual strict aseptic fashion.  The  patient had undergone multiple previous abdominal surgeries, including total  pancreatectomy.  Midline incision is selected and made with a #10 blade.  Dissection is carried down through the subcutaneous tissues.  The fascia is  incised in the midline.  The peritoneal cavity is entered cautiously.  There  are extensive adhesions throughout the peritoneal cavity.  This  requires a  lengthy dissection of approximately 1 to 1-1/2 hours in order to dissect out  the upper abdomen and particularly the left upper quadrant.  The colon is  identified.  Small bowel is identified.  Roux-en-Y limb for biliary gastric  reconstruction is also dissected out and identified.  The liver is dissected  out.  The most difficult portion of this dissection is identifying the  margins of the stomach and separating the gastric fundus from the overlying  diaphragm.  The nasogastric tube is positioned in the stomach.  The tumor is  palpable.  After approximately 1-1/2 hours of difficult lysis of adhesions  and dissection, all of the important anatomic features are identified.  At  this point, a Thompson retractor is placed for exposure.   Beginning at the Roux-en-Y anastomosis, the Roux limb is carefully mobilized  off of the retroperitoneum.  The Roux limb is transected just  proximal to  the gastrojejunal anastomosis with a GIA stapler.  The antrum of the stomach  is then carefully dissected out.  Vessels on both the greater and lesser  curvature are dissected out, divided between Sloan clamps, and ligated with  2-0 silk ties and 2-0 silk suture ligatures.  The antrum is therefore  mobilized, working proximally on the stomach.  The fundus of the stomach is  finally completely dissected away from the diaphragm and lateral  attachments. Care is taken to prevent injury to the underlying left kidney.  The tumor is palpable in the mid stomach along the greater curvature.  Dissection is carried up the lesser curvature and greater curvature,  dividing gastric vessels between Kelly clamps and ligating with 2-0 silk  ties until there is a clear margin above the level of the tumor.  A TA90  stapler is then carefully inserted.  Stay sutures are placed above the  stapler.  A staple line is placed at this point, leaves approximately 30% of  the stomach as a gastric remnant, consisting of the  GE junction and the  fundus.  Proximally one quarter of the lesser curvature is left in place.  The stapler is fired.  The stomach is transected distal to the stapler.  There is a large tumor measuring approximately 8 cm in greatest dimension  present.  There appears to be at least a 1.5  to 2 cm mucosal margin at the  point of resection.  The specimen is passed off the field and submitted to  pathology.  The stapler is removed.  Next, a side-to-side anastomosis is  created between the lateralmost portion of the staple line at the greater  curvature of the stomach and the antimesenteric border of the Roux-en-Y  jejunal limb.  Stay sutures are placed.  The lateralmost portion of the  staple line along the greater curvature is excised, using the curved Mayo  scissors.  Hemostasis is obtained via electrocautery.  An enterotomy is made  in the jejunal limb using the electrocautery.  Using interrupted 2-0 silk  sutures, a single layer circumferential anastomosis is completed.  There is  no tension on the anastomosis at the conclusion.  The left upper quadrant is  copiously irrigated with warm saline, which is evacuated.  Good hemostasis  is noted.  The nasogastric tube is positioned within the gastric remnant and  taped securely.  Two #19 [place] Blake drains are placed in the left upper  quadrant and brought out through the left abdominal wall and secured with 3-  0 nylon sutures.  The abdomen is irrigated.  Irrigation evacuated.  All  packs are evacuated.  Counts were correct.  The midline wound was closed  with a running #1 Novofil suture.  Subcutaneous tissue is irrigated.  Skin  is closed with stainless steel staples.  Sterile dressings are applied.  Patient is awakened from anesthesia and brought to the recovery room in  satisfactory condition in stable condition.  The patient tolerated the  procedure well.                                               Earnstine Regal, M.D.   TMG/MEDQ   D:  09/29/2003  T:  09/29/2003  Job:  [id number]   cc:   Eston Esters, M.D.  [place]. [place]  [place] [id number]  Fax: [phone]   Delfin Edis, M.D. [facility]   Ermalene Searing. Philip Aspen, M.D.  [address]  [place]  [place] [address]  Fax: [phone]

## 2010-12-20 NOTE — H&P (Signed)
[place]. [facility]  Patient:    Dylan Jefferson, Dylan Jefferson                      MRN: [id number] Adm. Date:  02/21/01 Attending:  Dominica Severin B. Benay Spice, M.D. CC:         Eston Esters, M.D.  Earnstine Regal, M.D.  Ermalene Searing Philip Aspen, M.D.   History and Physical  PATIENT IDENTIFICATION:  The patient is a 60 year old, with a history of metastatic renal cell carcinoma.  He is now admitted after a presyncopal episode with severe anemia.  HISTORY OF PRESENT ILLNESS:  The patient was diagnosed with metastatic renal cell carcinoma when he presented with a pancreatic mass in May of 2001.  He was taken to the operating room by Dr. Harlow Asa and underwent a total pancreatectomy, cholecystectomy, splenectomy, gastrojejunostomy, and a choledochojejunostomy.  The pathology confirmed metastatic renal cell carcinoma involving the pancreas.  Peripancreatic lymph nodes, the gallbladder, spleen, and peripancreatic fat were without evidence for metastatic disease.  He underwent an extensive negative staging evaluation at that time.  Since the time of surgery, he has been followed by Dr. Truddie Coco, and he has not received systemic therapy.  He last underwent restaging studies with a bone scan and CT scans of the chest, abdomen, and pelvis on June 28, and there was no evidence for metastatic disease.  He developed a presyncopal episode this evening and presented to the [facility] Emergency Room for evaluation.  He noted that his blood sugar was 131 at the time of his symptoms.  He says that he has felt tired and has had mild exertional dyspnea for the past few days.  In the emergency room, he was found to have a hemoglobin of 8.1.  He states that he has no history of anemia.  He denies bleeding.  He has not noted any change in the color of his bowel movements.  He denies abdominal pain.  He has otherwise felt well.  PAST MEDICAL HISTORY: 1. Metastatic renal cell carcinoma dating to May of  2001. 2. Remote history of a right nephrectomy (1989) for a "benign    process." 3. Severe osteoarthritis. 4. Surgically induced diabetes mellitus.  FAMILY HISTORY:  Noncontributory.  SOCIAL HISTORY:  He lives with his wife.  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  He does not use tobacco.  He drinks alcohol rarely. He says that he was transfused at the time of the pancreatectomy in 2001.  ALLERGIES:  No known drug allergies.  CURRENT MEDICATIONS: 1. Lantus insulin 17 units every 9 p.m. 2. Humalog insulin sliding-scale. 3. Naprosyn 500 mg b.i.d. 4. Hydrocodone/APAP 5/500 q.4-6h. p.r.n. 5. Pepcid 20 mg t.i.d. 6. Pancrease 4 tabs q.a.c. and 2 tablets prior to snacks.  REVIEW OF SYSTEMS:  GASTROINTESTINAL:  Negative.  GENITOURINARY:  Negative. RESPIRATORY:  Negative.  MUSCULOSKELETAL:  He has joint pain at various sites. CONSTITUTIONAL:  Negative.  PHYSICAL EXAMINATION:  VITAL SIGNS:  Temperature 99, pulse 61, blood pressure 116/65, respirations 16.  HEENT:  The conjunctivae and oral mucous membranes are pale.  No thrush.  No bleeding.  NECK:  Without mass.  LUNGS:  Clear bilaterally.  No respiratory distress.  CARDIAC:  Regular rate and rhythm.  No gallop.  ABDOMEN:  Soft.  No organomegaly.  No mass.  Nontender.  No palpable cervical, clavicular, axillary, or inguinal nodes.  EXTREMITIES:  No edema.  NEUROLOGICAL:  He is alert and oriented.  Motor and sensory examinations  are grossly intact.  I did not test the gait.  SKIN:  He is pale.  No rash.  LABORATORY DATA:  Hemoglobin 8.1, hematocrit 24%, MCV 87, platelets 303,000, white count 9.4.  ANC 6.7.  BUN 19, creatinine 1.1, calcium 8.2, albumin 3.0. Alkaline phosphatase 86, bilirubin 1.0, potassium 4.5, glucose 93.  IMPRESSION AND RECOMMENDATIONS:  The patient is a 60 year old, with a history of metastatic renal cell carcinoma, currently in clinical remission after undergoing a total  pancreatectomy in May of 2001.  He presents this evening with presyncope and severe anemia.  I suspect that his presyncopal episode is related to the severe anemia.  After initial review of his history and physical examination this evening, there is no apparent etiology for the anemia.  We do not have a recent CBC available for comparison.  He denies any known previous history of anemia and he denies bleeding.  He will be admitted for observation and further diagnostic evaluation.  If the hemoglobin remains low or falls further, we will provide transfusion support.  I will review the peripheral blood smear and we will obtain a reticulocyte count, direct Coombss test, LDH, serum ferritin, and an erythropoietin level. We will initiate additional diagnostic work-up as indicated.  The differential diagnosis for his anemia is broad.  My initial concern is that he could be developing early gastrointestinal bleed due to the use of Naprosyn, but he has not developed clinically apparent bleeding, and a stool Hemoccult was negative in the emergency room.  We will follow his blood sugar closely while hospitalized. DD:  02/21/01 TD:  02/22/01 Job: [id number]

## 2010-12-20 NOTE — Op Note (Signed)
[place]  Patient:    Dylan Jefferson, Dylan Jefferson                    MRN: [id number] Proc. Date: 12/17/99 Adm. Date:  [id number] Attending:  Gomez Cleverly CC:         Dylan Jefferson, M.D. (2)             Dylan Jefferson. March Rummage, M.D.             Lou Miner, M.D.             Dylan Jefferson. Dylan Jefferson, M.D.             Dylan Jefferson, M.D.                           Operative Report  PREOPERATIVE DIAGNOSES:  Abnormality, tail and body of pancreas, rule out chronic pancreatitis versus neoplasm; cholelithiasis.  POSTOPERATIVE DIAGNOSES: 1. Metastatic renal cell carcinoma to the pancreas. 2. Cholelithiasis. 3. Choledocholithiasis. 4. Chronic cholecystitis.  PROCEDURE: 1. Total pancreatectomy. 2. Cholecystectomy with intraoperative cholangiography. 3. Splenectomy. 4. Gastrojejunostomy. 5. Choledochojejunostomy.  SURGEON:  Dylan Jefferson, M.D.  ASSISTANT:  Dylan Jefferson. March Rummage, M.D.  ANESTHESIA:  General with postoperative epidural per Dr. Ala Dach and Dr. Crissie Sickles. [place].  ESTIMATED BLOOD LOSS:  1800 cc.  BLOOD PRODUCTS:  Two units of packed red blood cells.  DRAINS:  Two #10 Jackson-Pratt drains.  PREPARATION:  Betadine.  COMPLICATIONS:  None.  INDICATION:  Patient is a 60 year old white male who initially presented to Dr. Marcello Moores B. Jefferson on referral from [facility] where he was noted to have abnormal liver function tests and elevated pancreatic enzymes.  This slowly resolved over approximately 10 days.  Ultrasound was performed which showed a thick-walled gallbladder containing stones.  Biliary tree appeared normal without dilatation.  CT scan of the abdomen was obtained on November 27, 1999, demonstrating cholelithiasis; it also demonstrated an abnormality of the midbody and tail of the pancreas suspicious for neoplasm. MRI of the abdomen was performed on November 29, 1999 which essentially had the same findings as the CT scan.  It did  demonstrate a dilated pancreatic duct. A CA 19-9 was drawn and was only minimally elevated at 34.7, with the upper range of normal being 33.  The patient was presented at multi-disciplinary cancer conference by Dr. Aaron Edelman and myself.  A decision was made to proceed with exploratory laparotomy with distal pancreatectomy, splenectomy, cholecystectomy and intraoperative cholangiography.  In anticipation of this, the patient was seen by Dr. Eston Jefferson at the oncology offices and given vaccinations in anticipation of splenectomy.  Of note, the patients past medical history was notable for right nephrectomy in 1991 by Dr. Lucina Jefferson. Dylan Jefferson for benign disease, per the patient and his familys history.  FINDINGS AT OPERATION:  At operation, a diffusely diseased pancreas was encountered.  Frozen section analysis by Dr. Neldon Mc revealed a clear cell carcinoma consistent with metastatic renal cell carcinoma.  Patients surgical specimens from 1991 were obtained and slides reviewed, showing a renal cell carcinoma of the right kidney of clear cell type, which was identical to the findings in the pancreas at surgery today.  Patient was also noted to have cholelithiasis with pigment stones and on cholangiography, was found to have a common bile duct stone.  DESCRIPTION OF PROCEDURE:  Procedure was done in OR #9 at the [facility].  The patient was brought to the operating room and placed in a supine position on the operating room table.  Following the administration of general anesthesia, the patient was prepped and draped in the usual strict aseptic fashion.  After ascertaining that an adequate level of anesthesia had been obtained, bilateral subcostal incisions are made with a #10 blade.  Dissection is carried down through the subcutaneous tissues and hemostasis obtained with the electrocautery.  Fascia is incised, musculature is divided and peritoneal cavity is entered  cautiously.  Adhesions are taken down by sharp dissection and hemostasis obtained with the electrocautery.  A Bookwalter retractor is placed for exposure.  The abdomen is then explored. The right upper quadrant is carefully dissected out, exposing the gallbladder. There are numerous adhesions to the liver and the gallbladder.  The gallbladder contains multiple stones and is thick-walled.  The stomach appears grossly normal and the nasogastric tube is properly positioned.  The colon is grossly normal.  The hepatic flexure is followed back into the fossa created by the absence of the right kidney.  The splenic flexure is somewhat adherent to a large mass which is in the lesser sac.  The spleen is generous in size, approximately three times normal, and densely adherent to the diaphragm and peritoneal attachments.  Small bowel is normal from the ligament of Treitz to the ileocecal valve.  The appendix is normal.  The pelvis is normal.  There is no evidence of metastatic disease.  There are no omental implants.  The splenic flexure of the colon is mobilized.  The spleen is mobilized from its peritoneal attachment.  Next, the lesser sac is opened through the gastrocolic ligament.  This is divided between [place] clamps and ligated with 2-0 silk ties.  A grossly abnormal pancreas is encountered.  This is a diffuse process and involves the entire tail, body and head of the pancreas.  This is different than what had been imaged on CT scan and MRI scan, but on direct visualization and palpation, this is a diffusely abnormal gland.  Decision is made at this point to proceed with splenectomy in anticipation of doing at least a partial pancreatectomy; therefore, the spleen is mobilized from its peritoneal attachments.  Short gastric vessels are taken down between clamps, divided and ligated with 2-0 silk ties and 2-0 silk suture ligatures.  Vessels  are clamped at the hilum of the spleen and divided.   They are doubly ligated with 2-0 silk ligatures and 2-0 silk suture ligatures.  The spleen is completely excised and passed off the field.  Next, the tail of the pancreas is mobilized both along its superior and inferior borders using the electrocautery as well as 2-0 silk ties to ligate vessels in continuity. Dissection is carried over to the midbody of the pancreas.  At this point, suture ligatures are placed along the superior and inferior aspects of the pancreatic gland.  The gland is divided using the electrocautery.  Suture ligatures of 2-0 silk are used for hemostasis.  Splenic artery and vein are identified, isolated and doubly ligated.  The distal pancreas is then excised and submitted to pathology for frozen section.  Dr. Neldon Mc analyzed this and made the diagnosis of a clear cell carcinoma, which is a type of adenocarcinoma, infiltrating throughout the pancreatic parenchyma with a positive surgical margin.  We then discussed the patients history of previous right nephrectomy and the fact that this could represent metastatic disease, if the  patient had renal cell carcinoma in 1991.  Dr. Brigitte Pulse was able to find the pathology report from that procedure and indeed, the patient did have a renal cell carcinoma.  Slides were obtained from the storage room at [facility] and reviewed by Dr. Brigitte Pulse.  This was the identical cell type, being a clear cell carcinoma of the right kidney from [place]; therefore, the pancreatic disease represents metastatic disease from the right kidney. Patient has had an extensive metastatic workup and there is no other evidence of metastatic disease.  On our exploration, we were not able to identify any hepatic disease or any lymph node metastasis.  Dr. Hessie Diener was contacted by telephone; Dr. Solmon Ice. Shinn from the oncology group was also contacted by telephone.  Given that this was an isolated metastasis 10 years following his right  nephrectomy and the fact that the remainder of his metastatic workup was unrevealing, a decision was made to proceed with total pancreatectomy in an attempt to obtain a curative resection of the patients metastatic disease.  Therefore, we next proceed with cholecystectomy. Beginning at the fundus and using the electrocautery, the gallbladder was excised from the gallbladder bed.  It was dissected down.  The cystic artery was doubly clipped and divided.  Cystic duct was dissected out along its length.  A large yellow Ligaclip was placed at the neck of the gallbladder. Cystic duct was incised and a Taut catheter inserted.  Using C-arm fluoroscopy, real-time cholangiography was performed and representative photographs were made for the medical record.  There was a filling defect in the distal common bile duct and a moderately dilated common bile duct was encountered, consistent with choledocholithiasis.  There was flow of contrast into the duodenum.  No other ductal abnormality was immediately visible; therefore, we did proceed with total pancreatectomy.  The body of the pancreas was mobilized superiorly and inferiorly, with vessels divided in continuity with 2-0 silk ties and medium and large Ligaclips.  The antrum of the stomach was transected with a large GIA-type stapler.  Lesser omentum and greater omentum were divided along the edge of the stomach between Kelly clamps and ligated with 2-0 silk ties.  An extensive Kocher maneuver was performed.  The common bile duct was dissected out along its length and encircled with a Vesseloop.  The hepatic artery was identified and the gastroduodenal artery was identified and doubly ligated and divided.  The proximal jejunum was transected with a GIA-type stapler.  The mesentery was carefully taken down between hemostats and ligated with 2-0 silk ties and 2-0 silk suture ligatures, taking care to preserve the superior mesenteric artery.   Resection was carried into the retroperitoneum, such that the fourth portion of the duodenum was completely mobilized and passed behind the mesenteric vessels back into the right upper quadrant of the abdomen.  The pancreas was then carefully dissected across the portal vein.  Small venous tributaries were ligated in continuity with 3-0 silk ties.  The common bile duct was ligated with 2-0 silk tie and transected.  It contained clear bile.  The dissection of the porta hepatis did reveal a replaced right hepatic artery.  This was identified and the replaced right hepatic artery was preserved throughout the dissection.  It traversed the porta behind the common bile duct immediately lateral to the portal vein.  It entered the uncinate process at the superior portion and then traversed medially to join the superior mesenteric artery. The pancreatic head was then reflected off the  portal vein to its lateral aspect.  The superior vena cava was delineated posterior to the uncinate process.  Again, vessels were controlled by ligating in continuity with 3-0 and 2-0 silk ties and also with 2-0 silk sutures.  The uncinate process was mobilized, taking care to preserve the mesenteric vessels.  Finally, the attachments of the uncinate process were divided between Vanderbilt clamps and ligated with 2-0 silk ties.  The specimen was marked with a single short suture at the pancreatic body margin, a double short suture at the uncinate process margin and a double long suture at the common bile duct margin.  On frozen section, Dr. Desiree Lucy. Arrington III confirmed that there was no tumor present at the uncinate process margin.  Good hemostasis was obtained throughout.  The jejunum was brought retrocolic through a mesenteric window. The GI tract was reconstructed then with an end-to-side gastrojejunostomy, sewn with a single layer of interrupted 3-0 Vicryl sutures.  The biliary anastomosis was an end-to-side  choledochojejunostomy with interrupted 4-0 Vicryl sutures and the small bowel was affixed to the falciform ligament with interrupted 3-0 Vicryl sutures to provide suspension for the limb.  The mesenteric defect was closed around the jejunal limb with interrupted  3-0 Vicryl sutures.  The retroperitoneum was closed at the site of the ligament of Treitz with interrupted 2-0 silk sutures.  Abdomen was copiously irrigated and good hemostasis was obtained with the electrocautery and with 2-0 silk suture ligatures.  Two #10 Jackson-Pratt drains were placed, one in the left upper quadrant behind the stomach along the bed of the pancreas and the second behind the biliary anastomosis in the right upper quadrant.  Drains were secured to the skin with 3-0 nylon sutures.  All instrument and sponge counts were correct.  The abdominal wall was closed in two layers with running #1 Novofil suture.  Skin was closed with stainless steel staples.  Drains were placed to bulb suction.  Sterile occlusive dressings were applied.  Patient was awakened from anesthesia and brought to the recovery room extubated in good condition.  The patient tolerated the procedure well. DD:  12/17/99 TD:  12/20/99 Job: [id number]

## 2010-12-20 NOTE — Discharge Summary (Signed)
NAME:  Dylan Jefferson, Dylan Jefferson                       ACCOUNT NO.:  [id number]   MEDICAL RECORD NO.:  [id number]                   PATIENT TYPE:  INP   LOCATION:  [id number]                                 FACILITY:  [facility]   PHYSICIAN:  Earnstine Regal, M.D.                DATE OF BIRTH:  [birth year]   DATE OF ADMISSION:  09/26/2003  DATE OF DISCHARGE:  10/06/2003                                 DISCHARGE SUMMARY   REASON FOR ADMISSION:  Acute blood loss anemia, upper GI bleeding.   HISTORY OF PRESENT ILLNESS:  The patient is a pleasant 60 year old white  male admitted initially on the gastroenterology service for upper GI bleed.  The patient had been seen by his oncologist for weakness and  lightheadedness.  He was found to have a hemoglobin of 5.3.  The patient was  admitted on the gastroenterology service.  He underwent upper endoscopy  which demonstrated a mass in the stomach with active hemorrhage.   HOSPITAL COURSE:  The patient was admitted on the gastroenterology service.  He was seen in consultation on September 28, 2003 by general surgery.  The  patient is well known to my practice due to his history of renal cell  carcinoma with previous metastatic disease to the pancreas, the thyroid, and  now the stomach.   The patient was seen and evaluated by general surgery.  He was prepared and  taken to the operating room on September 29, 2003 where he underwent  exploratory laparotomy and subtotal gastrectomy.  He was found to have a  large tumor on the greater curvature of the stomach.  The final pathology  showed this to be consistent with metastatic renal cell carcinoma.  Postoperatively, the patient was initially cared for in the intensive care  unit.  He remained stable and was transferred to the floor.  He had good  control of his diabetes with sliding scale insulin.  He had gradual  resolution of his ileus.  The nasogastric tube was removed on the fifth  postoperative day, and he was  started on a clear liquid diet.  He was  advanced to a full liquid diet, and then to a diabetic diet by the sixth  postoperative day.  The patient was prepared for discharge home on the  seventh postoperative day.   DISCHARGE PLANNING:  The patient was discharged home on October 06, 2003 in  good condition, tolerating his regular diabetic diet, and ambulating  independently.  The patient will be seen back at my office at [facility] in 1 week for a wound check.   DISCHARGE MEDICATIONS:  1. Vicodin as needed for pain.  2. Other home medications as per usual.   FINAL DIAGNOSES:  1. Metastatic renal cell carcinoma.  2. Upper gastrointestinal bleeding.  3. Acute blood loss anemia.   CONDITION AT DISCHARGE:  Improved.  Earnstine Regal, M.D.    TMG/MEDQ  D:  10/06/2003  T:  10/07/2003  Job:  [id number]   cc:   Eston Esters, M.D.  [place]. [address] [place] [address]  Fax: [phone]   Delfin Edis, M.D. [facility]   [employer]. Terance Hart, M.D.  [place]. [address], [address] [place]  [place] [address]  Fax: [phone]   Bevelyn Buckles, M.D.

## 2010-12-20 NOTE — Procedures (Signed)
[place]. [facility]  Patient:    CALIX, MCNEISH Visit Number: [id number] MRN: [id number]          Service Type: END Location: ENDO Attending Physician:  Willards Cellar Dictated by:   Sandy Salaam. Deatra Ina, M.D. [facility] Proc. Date: 01/19/02 Admit Date:  01/19/2002 Discharge Date: 01/19/2002   CC:         Eston Esters, M.D.   Procedure Report  PROCEDURE:  Capsule endoscopy.  HISTORY:  Mr. Virgil has heme-positive stools.  He has a history of metastatic renal cell carcinoma diagnosed in May 2001.  He also has a history of a jejunal ulcer diagnosed in July 2002.  Upper endoscopy and colonoscopy were performed and were not diagnostic for a GI bleeding source.  DESCRIPTION OF PROCEDURE:  Capsule endoscopy was performed in the usual manner.  FINDINGS: 1. 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  There was no fresh or old blood. 2. There was an area of jejunum that was deeply erythematous with edematous    mucosa.  Though no frank bleeding was seen, the area was felt to be    abnormal and could represent a source for bleeding. Dictated by:   Sandy Salaam Deatra Ina, M.D. [place] Attending Physician:  [place] Cellar DD:  01/25/02 TD:  01/26/02 Job: [id number]

## 2010-12-20 NOTE — H&P (Signed)
NAME:  Dylan, Jefferson                       ACCOUNT NO.:  [id number]   MEDICAL RECORD NO.:  [id number]                   PATIENT TYPE:  INP   LOCATION:  0101                                 FACILITY:  [facility]   PHYSICIAN:  Delfin Edis, M.D. [facility]               DATE OF BIRTH:  [birth year]   DATE OF ADMISSION:  09/26/2003  DATE OF DISCHARGE:                                HISTORY & PHYSICAL   CHIEF COMPLAINT:  Weakness and lightheadedness.   HISTORY:  Dylan Jefferson is a pleasant 60 year old white male referred by oncology  today for admission for hemoglobin of 5.3.  He was seen in the oncology  clinic today with complaints of weakness, shortness of breath, and  lightheadedness.  He gave no evidence of recent melena, hematochezia, etc.,  but was found to have brown heme-positive stool on rectal exam.   The patient does have extensive past medical history.  He was initially  diagnosed with a renal cell CA in 1991 and status post right nephrectomy.  He was diagnosed with metastatic renal cell CA in 2001 and underwent a total  pancreatectomy and gastrojejunostomy due to a metastatic pancreatic mass.  Most recently, he had thyroidectomy in June 2004 due to a metastatic  disease.  He is also insulin-dependent post pancreatectomy and has history  of arthritis which has been painful.   The patient had a GI bleed in July of 2002 secondary to a jejunal ulcer and  was reevaluated for anemia and heme-positive stool by Dr. Deatra Ina in April of  2003 and subsequently underwent upper endoscopy which showed grade 2 varices  and a B2 anatomy.  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  He had done well without further evidence of bleeding or  anemia until currently.  His last hemoglobin per the oncology records in  August of  2004 was around 13.  He denies any aspirin or NSAID use, has been  on no blood thinners, stays on Pepcid 40 mg b.i.d.  He does relate a GI  virus approximately 2 weeks ago with nausea and vomiting and diarrhea.  His  grandchildren who live with him are also ill.  He said unfortunately he  stayed sick for much longer.  He did not note any hematemesis, melena, or  hematochezia with that illness but had just been recuperating over the past  4 to 5 days.  He says he thinks that this threw him over the edge with  weakness, etc.  He has no complaints of chest pain, has been getting  lightheaded with standing and short of breath with exertion.  He has no  complaints of abdominal pain.  His weight had been fairly stable until his  recent GI  virus.   The patient is admitted at this time for transfusions and then further GI  evaluation.   CURRENT MEDICATIONS:  1. Pepcid 40 mg b.i.d.  2. Synthroid 0.175 mg daily.  3. Pancrease 1 to 4 with meals and snacks.  4. Atenolol 25 mg daily.  5. Calcium 600 mg with meals.  6. Lantus insulin 17 units at 9:30 p.m. and Humalog sliding scale 6 to 8     units a.c. breakfast, 5 to 7 units a.c. lunch, 8 to 12 units a.c. dinner,     and 4 units at h.s.   ALLERGIES:  No known drug allergies.   PAST HISTORY:  Pertinent for hypertension, DJD, otherwise as above.   FAMILY HISTORY:  Negative for GI disease.  Positive for diabetes in his  parents.   SOCIAL HISTORY:  The patient is married.  He is disabled.  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  His grandchildren do live with he  and his wife.  He is a nonsmoker, no EtOH.   REVIEW OF SYSTEMS:  Pertinent only for arthritic symptoms other than above.   PHYSICAL EXAMINATION:  GENERAL:  A well-developed, pale white male.  He is  alert and oriented x3.  VITAL SIGNS:  Blood pressure 125/61, pulse 92.  Saturation is 98% on room  air.  HEENT:  Nontraumatic, normocephalic.  EOMI, PERRLA.  Sclerae  anicteric.  Conjunctivae are pale.  His lips are quite pale.  NECK:  There are no nodes palpable or JVD.  He has a thyroidectomy scar.  PULMONARY:  Clear to A&P.  CARDIOVASCULAR:  Regular rate and rhythm with S1 & S2.  ABDOMEN:  Abdomen is soft, nontender.  There is no palpable mass or  hepatosplenomegaly felt.  He has upper abdominal incisional scars.  RECTAL:  Not repeated at this time.  EXTREMITIES:  Without clubbing, cyanosis, or edema.  NEUROLOGICAL:  The patient is anxious, exam is noncontributory.   LABORATORY STUDIES:  Pro time 12.9, INR 1.  Electrolytes within normal  limits.  BUN 22, creatinine 1.3, glucose 136.  Liver function studies  normal.   IMPRESSION:  59. A 60 year old white male with metastatic renal cell cancer to the     pancreas status post pancreatectomy and gastrojejunostomy in 2001 status     post thyroidectomy June of 2004 status post remote nephrectomy on the     right in 1991.  2. Severe anemia microcytic with heme-positive stool and history of jejunal     ulcer and jejunal edema in 2002 and again in 2003.  This may be the     source of his current blood loss and question metastatic disease to the     small bowel.  3. Grade 2 esophageal varices on esophagogastroduodenoscopy 2003.  4. 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  5. History of hypertension.  6. Arthritis.   PLAN:  The patient is admitted to the service of Dr. Delfin Edis.  He will  be transfused 3 units of packed RBCs initially.  We will check iron studies.  He is scheduled for upper endoscopy at noon on Wednesday, February 23rd.  Unless there is an obvious source for his blood loss on endoscopy, he will  need further evaluation of his small bowel with small-bowel follow-through  and/or capsule endoscopy.  Will cover him with sliding scale insulin,  hydrocodone for his arthritic pain, keep him on Protonix IV, and for further details please see the orders.  Nicoletta Ba, P.A.-C. [facility]                Delfin Edis, M.D. [facility]    AE/MEDQ  D:  09/26/2003  T:  09/26/2003  Job:  [id number]   cc:   Ermalene Searing. Philip Aspen, M.D.  [address]  [place]  [place] [address]  Fax: [phone]   Eston Esters, M.D.  [address] [address]  Fax: [phone]   Earnstine Regal, M.D.  [address] [place]  [place] [address]  Fax: [phone]

## 2010-12-20 NOTE — Consult Note (Signed)
NAME:  Dylan Jefferson, Dylan Jefferson                       ACCOUNT NO.:  [id number]   MEDICAL RECORD NO.:  [id number]                   PATIENT TYPE:  INP   LOCATION:  [id number]                                 FACILITY:  [facility]   PHYSICIAN:  Earnstine Regal, M.D.                DATE OF BIRTH:  06-May-1951   DATE OF CONSULTATION:  09/28/2003  DATE OF DISCHARGE:                                   CONSULTATION   REFERRING PHYSICIAN:  Delfin Edis, M.D.   PRIMARY CARE PHYSICIAN:  Ermalene Searing. Philip Aspen, M.D.   REASON FOR CONSULTATION:  Gastric mass, upper GI bleed.   HISTORY OF PRESENT ILLNESS:  Dylan Jefferson is a pleasant 60 year old white  male well known to my surgical practice.  The patient was initially  diagnosed with renal cell carcinoma in 1991, and underwent right nephrectomy  by Dr. Hessie Diener.  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  The patient had a recent viral illness.  He  had progressive fatigue and weakness.  He was seen at the [facility] where hemoglobin was discovered to be 5.3.  He was admitted and  received transfusions.  He underwent upper endoscopy which demonstrated a  large gastric mass with active bleeding.  Biopsies have shown it to be  consistent with metastatic renal cell carcinoma.  General surgery is now  consulted for resection of gastric mass for control of hemorrhage and  treatment of metastatic renal cell carcinoma.   PAST MEDICAL HISTORY:  1. History of renal cell carcinoma as noted above, status post right     nephrectomy.  2. Status post total thyroidectomy.  3. Status post total pancreatectomy.  4. 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  5. History of hypertension.  6. History of degenerative joint disease.   MEDICATIONS:  1. Pepcid.  2.  Synthroid.  3. Pancrease.  4. Atenolol.  5. Calcium.  6. Lantus insulin.  7. Humalog sliding scale insulin.   ALLERGIES:  No known drug allergies.   SOCIAL HISTORY:  The patient is married.  He is accompanied by his wife,  daughter.  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  He  does not smoke.  He does not drink alcohol.   FAMILY HISTORY:  Notable for diabetes in the patient's parents.   REVIEW OF SYSTEMS:  A 15 system review without significant other positives  except as noted above.   PHYSICAL EXAMINATION:  GENERAL:  A 60 year old well-developed, well-  nourished white male on [facility] [address] at [place]:  Temperature of 97, blood pressure 137/70, pulse 61,  respirations 18.  HEENT:  Normocephalic, atraumatic.  Sclerae are clear.  Conjunctivae are  clear.  Pupils equal, round,  reactive.  Dentition is good.  Mucous membranes  are moist.  NECK:  Supple without mass.  Surgical wound is well-healed.  There is no  nodularity.  There is no lymphadenopathy.  LUNGS:  Clear to auscultation bilaterally without wheezes, rhonchi, or  rales.  CARDIAC:  Regular rate and rhythm without murmur.  ABDOMEN:  Soft without distention.  Bowel sounds are present.  There is a  well-healed bilateral subcostal surgical wound.  There is a well-healed  right flank wound.  On palpation there is no tenderness.  There are no  palpable masses.  There is no sign of hernia.  EXTREMITIES:  Nontender without edema.  NEUROLOGIC:  The patient is alert and oriented without focal deficits.   LABORATORY DATA:  Hemoglobin 8.9 after resuscitation.  Coagulation studies  are normal.   Radiographic studies:  CT scan of the abdomen and pelvis has been performed,  but results are pending.  I will review this study in radiology today.   IMPRESSION:  1. Upper gastrointestinal bleed.  2. Metastatic renal cell carcinoma presenting as gastric mass.  3. Insulin-dependent  diabetes mellitus.  4. Hypertension.   PLAN:  I discussed at length with [name], his wife, and his daughter,  the indications to proceed with urgent surgery.  We will put him on the  operating room schedule for Friday, September 29, 2003.  I plan to explore  the abdomen, looking for other signs of metastatic disease.  The patient  will require at least a partial gastrectomy in order to remove the tumor  from the stomach for control of bleeding.  He may require revision of his  gastrojejunal  anastomosis.  He may require total gastrectomy.  Risks and benefits of these  procedures are discussed.  The patient understands and wishes to proceed.   We will make arrangements with the operating room.  Anesthesia will see the  patient preoperatively.                                               Earnstine Regal, M.D.    TMG/MEDQ  D:  09/28/2003  T:  09/28/2003  Job:  [id number]   cc:   Ermalene Searing. Philip Aspen, M.D.  [address]  [place]  [place] [address]  Fax: [phone]   Eston Esters, M.D.  [address] [place] [address]  Fax: [phone]

## 2010-12-20 NOTE — Procedures (Signed)
[facility]  Patient:    Dylan Jefferson, Dylan Jefferson                    MRN: [id number] Proc. Date: 12/17/99 Adm. Date:  [id number] Attending:  Gomez Cleverly CC:         Earnstine Regal, M.D.                           Procedure Report  PROCEDURE PERFORMED:  Epidural catheter placement for postoperative pain relief.  ANESTHESIOLOGIST:  Crissie Sickles. Conrad Hebron, M.D.  We were consulted by Dr. Armandina Gemma to place an epidural catheter into Dylan Jefferson for postoperative pain relief.  Ala Dach, M.D. discussed the risks and benefits with the patient and the patient agreed to proceed with the catheter placement.  Due to Dr. Lasandra Beech unavailability I elected to place the epidural catheter at the end of the procedure.  DESCRIPTION OF PROCEDURE:  The patient was turned into the left lateral decubitus position.  The back was prepped with Betadine and using a #17 Tuohy needle, the epidural space was easily cannulated at the L2-3 interspace at the midline using a loss of resistance technique.  The catheter was then threaded 5 cm into the epidural space and then the needle removed.  The catheter was firmly fixed to the patients back.  After negative aspiration incrementally, 100 mcg of fentanyl in 10 cc of 0.5% lidocaine was injected.  The catheter was firmly fixed to the patients back.  He was then turned supine, extubated and taken to the recovery room in stable condition.  A fentanyl/Marcaine infusion will be begun in the PACU and he will be followed in the ICU and on the floor by the anesthesiology service. DD:  12/17/99 TD:  12/20/99 Job: [id number]

## 2011-08-05 HISTORY — PX: CATARACT EXTRACTION: SUR2

## 2014-01-09 LAB — IFOBT (OCCULT BLOOD): IFOBT: NEGATIVE

## 2014-01-19 ENCOUNTER — Encounter: Payer: Self-pay | Admitting: Gastroenterology

## 2014-03-29 ENCOUNTER — Encounter: Payer: Self-pay | Admitting: Gastroenterology

## 2014-03-29 ENCOUNTER — Ambulatory Visit (INDEPENDENT_AMBULATORY_CARE_PROVIDER_SITE_OTHER): Payer: Medicare Other | Admitting: Gastroenterology

## 2014-03-29 VITALS — BP 142/84 | HR 68 | Ht 69.5 in | Wt 195.0 lb

## 2014-03-29 DIAGNOSIS — I8501 Esophageal varices with bleeding: Secondary | ICD-10-CM | POA: Insufficient documentation

## 2014-03-29 DIAGNOSIS — Z1211 Encounter for screening for malignant neoplasm of colon: Secondary | ICD-10-CM

## 2014-03-29 DIAGNOSIS — Z85528 Personal history of other malignant neoplasm of kidney: Secondary | ICD-10-CM | POA: Insufficient documentation

## 2014-03-29 MED ORDER — NA SULFATE-K SULFATE-MG SULF 17.5-3.13-1.6 GM/177ML PO SOLN: 1.0000 | Freq: Once | ORAL | Status: DC

## 2014-03-29 NOTE — Assessment & Plan Note (Addendum)
I will check when his last colonoscopy was done and the results

## 2014-03-29 NOTE — Patient Instructions (Addendum)
You have been scheduled for a colonoscopy. Please follow written instructions given to you at your visit today.  Please pick up your prep kit at the pharmacy within the next 1-3 days. If you use inhalers (even only as needed), please bring them with you on the day of your procedure. Your physician has requested that you go to www.startemmi.com and enter the access code given to you at your visit today. This web site gives a general overview about your procedure. However, you should still follow specific instructions given to you by our office regarding your preparation for the procedure.  ________________________________________________________________________  X   INSULIN (LONG ACTING) MEDICATION INSTRUCTIONS (Lantus, NPH, 70/30, Humulin, Novolin-N)   The day before your procedure:  Take  your regular evening dose    The day of your procedure:  Do not take your morning dose    X INSULIN (SHORT ACTING) MEDICATION INSTRUCTIONS (Regular, Humulog, Novolog)   The day before your procedure:  Do not take your evening dose   The day of your procedure:  Do not take your morning dose

## 2014-03-29 NOTE — Progress Notes (Signed)
_                                                                                                                History of Present Illness:  Dylan Jefferson Is a 63 year old white male referredPatient underwent Whipple procedure for metastatic renal cell carcinoma in 2001.  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  In 2004 he underwent thyroidectomy for metastatic renal cell carcinoma.  He is referred for consideration of endoscopy.  Dr. Buel Ream note states that he has a history of bleeding esophageal varices. To his knowledge he has no history of liver disease and is unaware of having varices or cirrhosis.  There is a note that he last had colonoscopy in 2008.  Records are not available.   Past Medical History  Diagnosis Date  . Arthritis   . Cancer   . Kidney disease   . Diabetes   . Depression   . Hypertension   . Hypothyroidism    Past Surgical History  Procedure Laterality Date  . Thyroidectomy     family history is not on file. Current Outpatient Prescriptions  Medication Sig Dispense Refill  . amLODipine (NORVASC) 5 MG tablet       . Ascorbic Acid (VITAMIN C) 1000 MG tablet Take 1,000 mg by mouth daily.      Marland Kitchen aspirin 81 MG tablet Take 81 mg by mouth daily.      Marland Kitchen atenolol (TENORMIN) 100 MG tablet       . calcium citrate-vitamin D (CITRACAL+D) 315-200 MG-UNIT per tablet Take 1 tablet by mouth daily.      . Cyanocobalamin (VITAMIN B-12) 2500 MCG SUBL Place under the tongue daily.      . famotidine (PEPCID) 20 MG tablet Take 20 mg by mouth 2 (two) times daily.      . ferrous sulfate 325 (65 FE) MG tablet Take 325 mg by mouth daily with breakfast.      . Glucosamine-MSM-Hyaluronic Acd (JOINT HEALTH) 750-375-30 MG TABS Take 2 tablets by mouth daily.      . insulin glargine (LANTUS) 100 UNIT/ML injection Inject 6 Units into the skin 2 (two) times daily.      . insulin lispro (HUMALOG) 100 UNIT/ML injection Inject into  the skin 3 (three) times daily before meals.      . Iron-Vitamins (GERITOL COMPLETE) TABS Take 1 tablet by mouth daily.      . Lactobacillus (ACIDOPHILUS) CAPS capsule Take 1 capsule by mouth daily.      Marland Kitchen levothyroxine (SYNTHROID, LEVOTHROID) 300 MCG tablet       . lovastatin (MEVACOR) 40 MG tablet       . minoxidil (LONITEN) 10 MG tablet       . Multiple Vitamin (MULTIVITAMIN) tablet Take 1 tablet by mouth daily.      . Omega-3 Fatty Acids (FISH OIL) 1200 MG CAPS Take 1 capsule by mouth daily.      . Pancrelipase, Lip-Prot-Amyl, (CREON) 6000 UNITS CPEP  Take 4 capsules by mouth 3 (three) times daily.      . potassium citrate (UROCIT-K) 10 MEQ (1080 MG) SR tablet       . valsartan-hydrochlorothiazide (DIOVAN-HCT) 160-12.5 MG per tablet        No current facility-administered medications for this visit.   Allergies as of 03/29/2014  . (No Known Allergies)    reports that he has never smoked. He has never used smokeless tobacco. He reports that he drinks alcohol. He reports that he does not use illicit drugs.   Review of Systems: Pertinent positive and negative review of systems were noted in the above HPI section. All other review of systems were otherwise negative.  Vital signs were reviewed in today's medical record Physical Exam: General: Well developed , well nourished, no acute distress Skin: anicteric Head: Normocephalic and atraumatic Eyes:  sclerae anicteric, EOMI Ears: Normal auditory acuity Mouth: No deformity or lesions Neck: Supple, no masses or thyromegaly Lungs: Clear throughout to auscultation Heart: Regular rate and rhythm; no murmurs, rubs or bruits Abdomen: Soft, non tender and non distended. No masses, hepatosplenomegaly or hernias noted. Normal Bowel sounds Rectal:deferred Musculoskeletal: Symmetrical with no gross deformities  Skin: No lesions on visible extremities Pulses:  Normal pulses noted Extremities: No clubbing, cyanosis, edema or deformities  noted Neurological: Alert oriented x 4, grossly nonfocal Cervical Nodes:  No significant cervical adenopathy Inguinal Nodes: No significant inguinal adenopathy Psychological:  Alert and cooperative. Normal mood and affect  See Assessment and Plan under Problem List

## 2014-03-29 NOTE — Assessment & Plan Note (Signed)
I will have to check his prior records to see documentation for this.  If he has a history of varices that he ought to have a followup exam if it has been over one year since his last exam.

## 2014-03-31 ENCOUNTER — Telehealth: Payer: Self-pay | Admitting: *Deleted

## 2014-03-31 NOTE — Telephone Encounter (Signed)
Dr Deatra Ina, This patient had no colonoscopy done in 2008 as in his notes from [facility] colonoscopy was in 2003  Last Endoscopy in 2005   I told patient to keep appointment as scheduled

## 2014-04-02 NOTE — Telephone Encounter (Signed)
ok 

## 2014-04-04 ENCOUNTER — Encounter: Payer: Self-pay | Admitting: Gastroenterology

## 2014-04-12 ENCOUNTER — Ambulatory Visit (AMBULATORY_SURGERY_CENTER): Payer: Medicare Other | Admitting: Gastroenterology

## 2014-04-12 ENCOUNTER — Encounter: Payer: Self-pay | Admitting: Gastroenterology

## 2014-04-12 VITALS — BP 111/71 | HR 49 | Temp 96.4°F | Resp 12 | Ht 69.5 in | Wt 195.0 lb

## 2014-04-12 DIAGNOSIS — Z1211 Encounter for screening for malignant neoplasm of colon: Secondary | ICD-10-CM

## 2014-04-12 LAB — GLUCOSE, CAPILLARY
GLUCOSE-CAPILLARY: 229 mg/dL — AB (ref 70–99)
Glucose-Capillary: 234 mg/dL — ABNORMAL HIGH (ref 70–99)

## 2014-04-12 MED ORDER — SODIUM CHLORIDE 0.9 % IV SOLN: 500.0000 mL | INTRAVENOUS | Status: DC

## 2014-04-12 NOTE — Progress Notes (Signed)
Patient denies any allergies to eggs or soy. Patient denies any problems with anesthesia/sedation. Patient denies any oxygen use at home and does not take any diet/weight loss medications.  

## 2014-04-12 NOTE — Op Note (Signed)
[facility]  [address] [place], [id number]   COLONOSCOPY PROCEDURE REPORT  PATIENT: Dylan Jefferson, Dylan Jefferson.  MR#: [id number] BIRTHDATE: [birth year] , 63  yrs. old GENDER: Male ENDOSCOPIST: Inda Castle, MD REFERRED [id number] Philip Aspen, M.D. PROCEDURE DATE:  04/12/2014 PROCEDURE:   Colonoscopy, diagnostic First Screening Colonoscopy - Avg.  risk and is 50 yrs.  old or older - No.  Prior Negative Screening - Now for repeat screening. 10 or more years since last screening  History of Adenoma - Now for follow-up colonoscopy & has been > or = to 3 yrs.  N/A  Polyps Removed Today? No.  Recommend repeat exam, <10 yrs? No. ASA CLASS:   Class II INDICATIONS:Average risk patient for colon cancer. MEDICATIONS: MAC sedation, administered by CRNA and Propofol (Diprivan) 370 mg IV  DESCRIPTION OF PROCEDURE:   After the risks benefits and alternatives of the procedure were thoroughly explained, informed consent was obtained.  A digital rectal exam revealed no abnormalities of the rectum.   The [id number]  endoscope was introduced through the anus and advanced to the cecum, which was identified by both the appendix and ileocecal valve. No adverse events experienced.   The quality of the prep was Suprep good  The instrument was then slowly withdrawn as the colon was fully examined.      COLON FINDINGS: Internal hemorrhoids were found.   The colon was otherwise normal.  There was no diverticulosis, inflammation, polyps or cancers unless previously stated.  Retroflexed views revealed no abnormalities. The time to cecum=8 minutes 19 seconds. Withdrawal time=7 minutes 38 seconds.  The scope was withdrawn and the procedure completed. COMPLICATIONS: There were no complications.  ENDOSCOPIC IMPRESSION: 1.   Internal hemorrhoids 2.   The colon was otherwise normal  RECOMMENDATIONS: Continue current colorectal screening recommendations for "routine risk" patients  with a repeat colonoscopy in 10 years.   eSigned:  Inda Castle, MD 04/12/2014 2:41 PM   cc:

## 2014-04-12 NOTE — Patient Instructions (Signed)
YOU HAD AN ENDOSCOPIC PROCEDURE TODAY AT THE [facility]: Refer to the procedure report that was given to you for any specific questions about what was found during the examination.  If the procedure report does not answer your questions, please call your gastroenterologist to clarify.  If you requested that your care partner not be given the details of your procedure findings, then the procedure report has been included in a sealed envelope for you to review at your convenience later.  YOU SHOULD EXPECT: Some feelings of bloating in the abdomen. Passage of more gas than usual.  Walking can help get rid of the air that was put into your GI tract during the procedure and reduce the bloating. If you had a lower endoscopy (such as a colonoscopy or flexible sigmoidoscopy) you may notice spotting of blood in your stool or on the toilet paper. If you underwent a bowel prep for your procedure, then you may not have a normal bowel movement for a few days.  DIET: Your first meal following the procedure should be a light meal and then it is ok to progress to your normal diet.  A half-sandwich or bowl of soup is an example of a good first meal.  Heavy or fried foods are harder to digest and may make you feel nauseous or bloated.  Likewise meals heavy in dairy and vegetables can cause extra gas to form and this can also increase the bloating.  Drink plenty of fluids but you should avoid alcoholic beverages for 24 hours.  ACTIVITY: Your care partner should take you home directly after the procedure.  You should plan to take it easy, moving slowly for the rest of the day.  You can resume normal activity the day after the procedure however you should NOT DRIVE or use heavy machinery for 24 hours (because of the sedation medicines used during the test).    SYMPTOMS TO REPORT IMMEDIATELY: A gastroenterologist can be reached at any hour.  During normal business hours, 8:30 AM to 5:00 PM Monday through Friday,  call [phone].  After hours and on weekends, please call the GI answering service at [phone] who will take a message and have the physician on call contact you.   Following lower endoscopy (colonoscopy or flexible sigmoidoscopy):  Excessive amounts of blood in the stool  Significant tenderness or worsening of abdominal pains  Swelling of the abdomen that is new, acute  Fever of 100F or higher  Following upper endoscopy (EGD)  Vomiting of blood or coffee ground material  New chest pain or pain under the shoulder blades  Painful or persistently difficult swallowing  New shortness of breath  Fever of 100F or higher  Black, tarry-looking stools  FOLLOW UP: If any biopsies were taken you will be contacted by phone or by letter within the next 1-3 weeks.  Call your gastroenterologist if you have not heard about the biopsies in 3 weeks.  Our staff will call the home number listed on your records the next business day following your procedure to check on you and address any questions or concerns that you may have at that time regarding the information given to you following your procedure. This is a courtesy call and so if there is no answer at the home number and we have not heard from you through the emergency physician on call, we will assume that you have returned to your regular daily activities without incident.  SIGNATURES/CONFIDENTIALITY: You and/or your care   partner have signed paperwork which will be entered into your electronic medical record.  These signatures attest to the fact that that the information above on your After Visit Summary has been reviewed and is understood.  Full responsibility of the confidentiality of this discharge information lies with you and/or your care-partner.YOU HAD AN ENDOSCOPIC PROCEDURE TODAY AT [facility]: Refer to the procedure report that was given to you for any specific questions about what was found during the examination.   If the procedure report does not answer your questions, please call your gastroenterologist to clarify.  If you requested that your care partner not be given the details of your procedure findings, then the procedure report has been included in a sealed envelope for you to review at your convenience later.  YOU SHOULD EXPECT: Some feelings of bloating in the abdomen. Passage of more gas than usual.  Walking can help get rid of the air that was put into your GI tract during the procedure and reduce the bloating. If you had a lower endoscopy (such as a colonoscopy or flexible sigmoidoscopy) you may notice spotting of blood in your stool or on the toilet paper. If you underwent a bowel prep for your procedure, then you may not have a normal bowel movement for a few days.  DIET: Your first meal following the procedure should be a light meal and then it is ok to progress to your normal diet.  A half-sandwich or bowl of soup is an example of a good first meal.  Heavy or fried foods are harder to digest and may make you feel nauseous or bloated.  Likewise meals heavy in dairy and vegetables can cause extra gas to form and this can also increase the bloating.  Drink plenty of fluids but you should avoid alcoholic beverages for 24 hours.  ACTIVITY: Your care partner should take you home directly after the procedure.  You should plan to take it easy, moving slowly for the rest of the day.  You can resume normal activity the day after the procedure however you should NOT DRIVE or use heavy machinery for 24 hours (because of the sedation medicines used during the test).    SYMPTOMS TO REPORT IMMEDIATELY: A gastroenterologist can be reached at any hour.  During normal business hours, 8:30 AM to 5:00 PM Monday through Friday, call [phone].  After hours and on weekends, please call the GI answering service at [phone] who will take a message and have the physician on call contact you.   Following lower  endoscopy (colonoscopy or flexible sigmoidoscopy):  Excessive amounts of blood in the stool  Significant tenderness or worsening of abdominal pains  Swelling of the abdomen that is new, acute  Fever of 100F or higher    FOLLOW UP: If any biopsies were taken you will be contacted by phone or by letter within the next 1-3 weeks.  Call your gastroenterologist if you have not heard about the biopsies in 3 weeks.  Our staff will call the home number listed on your records the next business day following your procedure to check on you and address any questions or concerns that you may have at that time regarding the information given to you following your procedure. This is a courtesy call and so if there is no answer at the home number and we have not heard from you through the emergency physician on call, we will assume that you have returned to your regular  daily activities without incident.  SIGNATURES/CONFIDENTIALITY: You and/or your care partner have signed paperwork which will be entered into your electronic medical record.  These signatures attest to the fact that that the information above on your After Visit Summary has been reviewed and is understood.  Full responsibility of the confidentiality of this discharge information lies with you and/or your care-partner.   Hemorrhoid information given.   Repeat colonoscopy in 10 years-2025

## 2014-04-12 NOTE — Progress Notes (Signed)
Report to PACU, RN, vss, BBS= Clear.  

## 2014-04-13 ENCOUNTER — Telehealth: Payer: Self-pay | Admitting: *Deleted

## 2014-04-13 NOTE — Telephone Encounter (Signed)
  Follow up Call-  Call back number 04/12/2014  Post procedure Call Back phone  # [phone]  Permission to leave phone message Yes     Patient questions:  Do you have a fever, pain , or abdominal swelling? No. Pain Score  0 *  Have you tolerated food without any problems? Yes.    Have you been able to return to your normal activities? Yes.    Do you have any questions about your discharge instructions: Diet   No. Medications  No. Follow up visit  No.  Do you have questions or concerns about your Care? No.  Actions: * If pain score is 4 or above: No action needed, pain <4.

## 2015-05-08 ENCOUNTER — Other Ambulatory Visit: Payer: Self-pay | Admitting: Internal Medicine

## 2015-05-08 DIAGNOSIS — R059 Cough, unspecified: Secondary | ICD-10-CM

## 2015-05-08 DIAGNOSIS — R05 Cough: Secondary | ICD-10-CM

## 2015-05-09 ENCOUNTER — Ambulatory Visit
Admission: RE | Admit: 2015-05-09 | Discharge: 2015-05-09 | Disposition: A | Discharge status: Home / Self Care | Payer: Medicare Other | Source: Ambulatory Visit | Attending: Internal Medicine | Admitting: Internal Medicine

## 2015-05-09 DIAGNOSIS — R059 Cough, unspecified: Secondary | ICD-10-CM

## 2015-05-09 DIAGNOSIS — R05 Cough: Secondary | ICD-10-CM

## 2015-05-24 ENCOUNTER — Encounter: Payer: Self-pay | Admitting: Hematology

## 2015-05-24 ENCOUNTER — Ambulatory Visit (HOSPITAL_BASED_OUTPATIENT_CLINIC_OR_DEPARTMENT_OTHER): Payer: Medicare Other

## 2015-05-24 ENCOUNTER — Ambulatory Visit (HOSPITAL_BASED_OUTPATIENT_CLINIC_OR_DEPARTMENT_OTHER): Payer: Medicare Other | Admitting: Hematology

## 2015-05-24 ENCOUNTER — Telehealth: Payer: Self-pay | Admitting: Hematology

## 2015-05-24 VITALS — BP 146/72 | HR 48 | Temp 98.4°F | Resp 18 | Ht 69.5 in | Wt 200.1 lb

## 2015-05-24 DIAGNOSIS — E039 Hypothyroidism, unspecified: Secondary | ICD-10-CM

## 2015-05-24 DIAGNOSIS — Z85528 Personal history of other malignant neoplasm of kidney: Secondary | ICD-10-CM | POA: Diagnosis not present

## 2015-05-24 DIAGNOSIS — C7900 Secondary malignant neoplasm of unspecified kidney and renal pelvis: Secondary | ICD-10-CM | POA: Diagnosis not present

## 2015-05-24 LAB — COMPREHENSIVE METABOLIC PANEL (CC13)
ALT: 43 U/L (ref 0–55)
ANION GAP: 7 meq/L (ref 3–11)
AST: 29 U/L (ref 5–34)
Albumin: 4 g/dL (ref 3.5–5.0)
Alkaline Phosphatase: 109 U/L (ref 40–150)
BUN: 13.1 mg/dL (ref 7.0–26.0)
CALCIUM: 9.2 mg/dL (ref 8.4–10.4)
CHLORIDE: 108 meq/L (ref 98–109)
CO2: 25 mEq/L (ref 22–29)
Creatinine: 1.5 mg/dL — ABNORMAL HIGH (ref 0.7–1.3)
EGFR: 49 mL/min/{1.73_m2} — ABNORMAL LOW (ref >90)
Glucose: 337 mg/dl — ABNORMAL HIGH (ref 70–140)
POTASSIUM: 4.1 meq/L (ref 3.5–5.1)
Sodium: 140 mEq/L (ref 136–145)
Total Bilirubin: 1.12 mg/dL (ref 0.20–1.20)
Total Protein: 6.5 g/dL (ref 6.4–8.3)

## 2015-05-24 LAB — CBC & DIFF AND RETIC
BASO%: 1 % (ref 0.0–2.0)
BASOS ABS: 0.1 10*3/uL (ref 0.0–0.1)
EOS ABS: 0.2 10*3/uL (ref 0.0–0.5)
EOS%: 3.3 % (ref 0.0–7.0)
HEMATOCRIT: 40.8 % (ref 38.4–49.9)
HGB: 14.1 g/dL (ref 13.0–17.1)
Immature Retic Fract: 2.2 % — ABNORMAL LOW (ref 3.00–10.60)
LYMPH#: 2.5 10*3/uL (ref 0.9–3.3)
LYMPH%: 37.1 % (ref 14.0–49.0)
MCH: 33.9 pg — AB (ref 27.2–33.4)
MCHC: 34.6 g/dL (ref 32.0–36.0)
MCV: 98.1 fL — ABNORMAL HIGH (ref 79.3–98.0)
MONO#: 1.1 10*3/uL — AB (ref 0.1–0.9)
MONO%: 15.6 % — AB (ref 0.0–14.0)
NEUT#: 2.9 10*3/uL (ref 1.5–6.5)
NEUT%: 43 % (ref 39.0–75.0)
PLATELETS: 207 10*3/uL (ref 140–400)
RBC: 4.16 10*6/uL — ABNORMAL LOW (ref 4.20–5.82)
RDW: 12.9 % (ref 11.0–14.6)
RETIC %: 2.42 % — AB (ref 0.80–1.80)
Retic Ct Abs: 100.67 10*3/uL — ABNORMAL HIGH (ref 34.80–93.90)
WBC: 6.7 10*3/uL (ref 4.0–10.3)

## 2015-05-24 LAB — LACTATE DEHYDROGENASE (CC13): LDH: 175 U/L (ref 125–245)

## 2015-05-24 NOTE — Telephone Encounter (Signed)
Gave and printed appt sched and avs fo rpt; for NOV  °

## 2015-05-24 NOTE — Progress Notes (Signed)
Dylan Jefferson Kitchen    HEMATOLOGY/ONCOLOGY CONSULTATION NOTE  Date of Service: 05/24/2015  Patient Care Team: [name] as PCP - General (Surgery)  CHIEF COMPLAINTS/PURPOSE OF CONSULTATION:  Concern for metastatic renal cell carcinoma with new lung nodules  HISTORY OF PRESENTING ILLNESS:  Dylan Jefferson is a wonderful 64 y.o. male who has been referred to [place] by Dr Sharlett Iles for evaluation and management of newly noted lung nodules that are concerning for metastatic renal cell carcinoma.  Patient has a history of rheumatoid arthritis on plaquenil, hypertension, diabetes, chronic kidney disease with a single kidney, remote history of metastatic renal cell carcinoma who was recently seen by his primary care physician and had a CT of the chest on 05/09/2015 that showed multiple bilateral pulmonary nodules the largest of which lies within the right middle lobe measuring 2 cm. This picture was concerning for metastatic disease and lead to his referral to [place].  Patient was previously seen by Dr. Truddie Coco for his remote history of metastatic renal cell carcinoma but stopped follow-up of the cancer Center due to concerns with medical costs. Patient notes that about 6 months ago he had some food stuck in his throat and wonders if it got into his lungs and explains findings on CT of the chest. I noted that this would be unlikely related to aspiration and is concerning for a neoplastic process.  Was diagnosed with renal cell carcinoma 20 years ago and had a right nephrectomy. About 8 years after that he was noted to have abdominal recurrence in his pancreas spleen and small intestine and had a Whipple's procedure and significant abdominal surgery and notes that 10 out of 17 lymph nodes were positive. He also had his gallbladder removed. Postoperative course was complicated by an internal hemorrhage as per his report. Patient notes 2-3 years after that he had recurrence in his thyroid that led to a thyroidectomy. 2  years later he had gastric resection for local recurrence. Patient notes that he has had no known evidence of recurrence over the last 8 years until his recent CT scan showed lung nodules.  Patient is agreeable to getting a PET/CT scan for further evaluation. He has multiple metallic clips in his abdomen related to his previous surgery and therefore an MRI of the brain was not possible. Also he has only a single kidney and therefore we would avoid IV contrast and therefore a CT of the head without contrast was done understanding that this probably has a lower sensitivity to progressive brain metastases.   He has no other acute focal symptoms at this time. No abdominal pain. No headaches. No focal neurological deficits. No weight loss. No fevers or chills or night sweats. No significant cough or shortness of breath.  He has had a colonoscopy on 04/12/2014 that did not show any signs of colon cancer.   MEDICAL HISTORY:  Past Medical History  Diagnosis Date  . Arthritis   . Kidney disease   . Diabetes ([place])   . Depression   . Hypertension   . Hypothyroidism   . Cancer [facility])     Renal cell  Rheumatoid arthritis on plaquenil. 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  SURGICAL HISTORY: Past Surgical History  Procedure Laterality Date  . Thyroidectomy    . Gastrectomy      tumor removed  . Whipple procedure    . Nephrectomy Left     SOCIAL HISTORY: Social History   Social History  . Marital Status:  Married    Spouse Name: N/A  . Number of Children: 1  . Years of Education: N/A   Occupational History  . Disabled    Social History Main Topics  . Smoking status: Never Smoker   . Smokeless tobacco: Never Used  . Alcohol Use: Yes     Comment: occasional beer  . Drug Use: No  . Sexual Activity: Not on file   Other Topics Concern  . Not on file   Social History Narrative   reports that he smoked for less than 6 months  about 42 years ago. Alcohol 2-3 beers every week.  FAMILY HISTORY: Family History  Problem Relation Age of Onset  . Colon cancer Neg Hx   . Bleeding Disorder Mother     Unknown what type  . Osteoporosis Mother   . Lymphoma Father     Hodgkin's lymphoma    ALLERGIES:  has No Known Allergies.  MEDICATIONS:  Current Outpatient Prescriptions  Medication Sig Dispense Refill  . amLODipine (NORVASC) 5 MG tablet     . Ascorbic Acid (VITAMIN C) 1000 MG tablet Take 1,000 mg by mouth daily.    Dylan Jefferson Kitchen aspirin 81 MG tablet Take 81 mg by mouth daily.    Dylan Jefferson Kitchen atenolol (TENORMIN) 100 MG tablet     . augmented betamethasone dipropionate (DIPROLENE-AF) 0.05 % cream   0  . calcium citrate-vitamin D (CITRACAL+D) 315-200 MG-UNIT per tablet Take 2 tablets by mouth daily.     . Cyanocobalamin (VITAMIN B-12) 2500 MCG SUBL Place under the tongue daily.    . famotidine (PEPCID) 20 MG tablet Take 20 mg by mouth 2 (two) times daily.    . ferrous sulfate 325 (65 FE) MG tablet Take 325 mg by mouth 2 (two) times daily with a meal.     . Glucosamine-MSM-Hyaluronic Acd (JOINT HEALTH) 750-375-30 MG TABS Take 2 tablets by mouth daily.    . hydroxychloroquine (PLAQUENIL) 200 MG tablet   0  . insulin lispro (HUMALOG) 100 UNIT/ML injection Inject into the skin 3 (three) times daily before meals.    . Iron-Vitamins (GERITOL COMPLETE) TABS Take 1 tablet by mouth daily.    . Lactobacillus (ACIDOPHILUS) CAPS capsule Take 1 capsule by mouth daily.    Dylan Jefferson Kitchen LEVEMIR 100 UNIT/ML injection Inject 7 Units into the skin 2 (two) times daily.   1  . levothyroxine (SYNTHROID, LEVOTHROID) 300 MCG tablet     . lovastatin (MEVACOR) 40 MG tablet     . minoxidil (LONITEN) 10 MG tablet     . Multiple Vitamin (MULTIVITAMIN) tablet Take 1 tablet by mouth daily.    . Omega-3 Fatty Acids (FISH OIL) 1200 MG CAPS Take 1 capsule by mouth daily.    . Pancrelipase, Lip-Prot-Amyl, (CREON) 6000 UNITS CPEP Take 4 capsules by mouth 3 (three) times daily.      . potassium citrate (UROCIT-K) 10 MEQ (1080 MG) SR tablet     . valsartan-hydrochlorothiazide (DIOVAN-HCT) 160-12.5 MG per tablet      No current facility-administered medications for this visit.   Facility-Administered Medications Ordered in Other Visits  Medication Dose Route Frequency Provider Last Rate Last Dose  . fludeoxyglucose F - 18 (FDG) injection 9.53 milli Curie  9.53 milli Curie Intravenous Once PRN Brunetta Genera, MD   9.53 milli Curie at 06/06/15 [phone]    REVIEW OF SYSTEMS:    10 Point review of Systems was done is negative except as noted above.  PHYSICAL EXAMINATION: ECOG PERFORMANCE STATUS: 1 -  Symptomatic but completely ambulatory  . Filed Vitals:   05/24/15 1350  BP: 146/72  Pulse: 48  Temp: 98.4 F (36.9 C)  Resp: 18   Filed Weights   05/24/15 1350  Weight: 200 lb 1.6 oz (90.765 kg)   .Body mass index is 29.14 kg/(m^2).  GENERAL:alert, in no acute distress and comfortable SKIN: skin color, texture, turgor are normal, no rashes or significant lesions EYES: normal, conjunctiva are pink and non-injected, sclera clear OROPHARYNX:no exudate, no erythema and lips, buccal mucosa, and tongue normal  NECK: supple, no JVD, thyroid normal size, non-tender, without nodularity LYMPH:  no palpable lymphadenopathy in the cervical, axillary or inguinal LUNGS: clear to auscultation with normal respiratory effort HEART: regular rate & rhythm,  no murmurs and no lower extremity edema ABDOMEN: abdomen soft, non-tender, normoactive bowel sounds  Musculoskeletal: no cyanosis of digits and no clubbing  PSYCH: alert & oriented x 3 with fluent speech NEURO: no focal motor/sensory deficits  LABORATORY DATA:  I have reviewed the data as listed  . CBC Latest Ref Rng 10/31/2010 10/30/2010 10/29/2010  WBC 4.0 - 10.5 K/uL 6.9 7.6 9.1  Hemoglobin 13.0 - 17.0 g/dL 12.3(L) 11.2(L) 10.7(L)  Hematocrit 39.0 - 52.0 % 36.2(L) 33.3(L) 30.9(L)  Platelets 150 - 400 K/uL 224 190  166    . CMP Latest Ref Rng 10/31/2010 10/30/2010 10/29/2010  Glucose 70 - 99 mg/dL 163(H) 67(L) 123(H)  BUN 6 - 23 mg/dL 6 7 14  DELTA CHECK NOTED RESULT REPEATED AND VERIFIED  Creatinine 0.4 - 1.5 mg/dL 0.92 0.97 1.25  Sodium 135 - 145 mEq/L 140 143 141  Potassium 3.5 - 5.1 mEq/L 3.8 3.6 3.5  Chloride 96 - 112 mEq/L 105 112 115(H)  CO2 19 - 32 mEq/L 31 28 23   Calcium 8.4 - 10.5 mg/dL 8.0(L) 7.5(L) 7.1(L)  Total Protein 6.0 - 8.3 g/dL 5.3(L) 4.9(L) 4.5(L)  Total Bilirubin 0.3 - 1.2 mg/dL 0.9 0.9 0.9  Alkaline Phos 39 - 117 U/L 57 50 53  AST 0 - 37 U/L 21 19 15   ALT 0 - 53 U/L 21 16 14      RADIOGRAPHIC STUDIES: I have personally reviewed the radiological images as listed and agreed with the findings in the report. Ct Chest Wo Contrast  05/09/2015  CLINICAL DATA:  History of right lung mass by outside chest x-ray, history of prior nephrectomy for renal cell carcinoma EXAM: CT CHEST WITHOUT CONTRAST TECHNIQUE: Multidetector CT imaging of the chest was performed following the standard protocol without IV contrast. COMPARISON:  By report from 05/02/2015 FINDINGS: The lungs are well aerated bilaterally. No focal infiltrate or sizable effusion is noted. A dominant 2.0 cm mass lesion is noted within the right middle lobe near the junction of the major and minor fissures. Additionally multiple bilateral smaller pulmonary nodules are seen the largest of these other lesions measures 13 mm and lies in the posterior costophrenic angle on the left. The thoracic inlet is within normal limits. The thoracic aorta and its branches are within normal limits with the exception very mild atherosclerotic calcifications. No hilar or mediastinal adenopathy is identified. Mild Coronary calcifications are seen. Scanning into the upper abdomen demonstrates changes consistent with prior Whipple procedure as well as a right nephrectomy. No definitive bony abnormality is seen. IMPRESSION: Multiple bilateral pulmonary nodules  the largest of which lies within the right middle lobe measuring 2 cm. Given the patient's clinical history and multiplicity of lesions, these are consistent with metastatic disease. CT-guided biopsy would be helpful  for further evaluation. These results will be called to the ordering clinician or representative by the Radiologist Assistant, and communication documented in the PACS or zVision Dashboard. Electronically Signed   By: Inez Catalina M.D.   On: 05/09/2015 09:22    ASSESSMENT & PLAN:   64 year old Caucasian male with  #1 Remote history of metastatic renal cell carcinoma status post right-sided nephrectomy with multiple recurrences treated with surgical excision as detailed above. He has been NED status for the last 8 years. He stopped following up with the cancer Center due to concerns about medical costs. He has been referred back to [place] by Dr. Sharlett Iles after recent CT of the chest showed multiple lung nodules concerning for neoplastic etiology.  His pulmonary nodules are certainly concerning for metastatic neoplastic process. Renal cell carcinoma is known to have natural history with delayed metastatic presentations. Other metastatic malignancies are also on the differential. He's had a colonoscopy on 04/12/2014 that he notes was unrevealing. No other obvious focal symptoms suggesting an alternative primary.  Other less likely possibility is that these may represent rheumatoid lung nodules.  Plan. -We will get a PET CT scan to evaluate the burden of disease and to check for other sites of disease. -This might also help determine the best site for diagnostic biopsy. -Since the patient has multiple metallic clips in the abdomen MRI is not possible. Also he has a single kidney with chronic kidney disease and therefore we would avoid using IV contrast. As a result a CT of the head without contrast was ordered to check for brain metastases. Patient understands this would have a lower  sensitivity. -We'll follow the patient in about 2 weeks after about workup is completed to plan or additional diagnostic workup with a biopsy. -Further management as per biopsy results. -If the PET scan reveals oligo metastatic disease that is consistent with renal cell carcinoma there might be a role for surgical resection. However if it is unresectable widely metastatic disease we would be looking at palliative treatments with targeted therapies TKI followed by immunotherapy.  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 #4 hypothyroidism related to his thyroidectomy #5 single kidney with chronic kidney disease. #6 nephrolithiasis. 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 #8 rheumatoid arthritis on Plaquenil Plan  -continue follow-up with primary care physician regarding these other medical problems  Return to care with Dr. Irene Limbo in 2 weeks with PET/CT scan and CT of the head without contrast .  All of the patientsand his wife's  questions were answered to their  apparent satisfaction. The patient knows to call the clinic with any problems, questions or concerns.  I spent 50 minutes counseling the patient face to face. The total time spent in the appointment was 60 minutes and more than 50% was on counseling and direct patient cares.    Sullivan Lone MD [place] AAHIVMS [facility] Hematology/Oncology Physician [facility]  (Office):       [phone] (Work cell):  [phone] (Fax):           [phone]  05/24/2015 2:30 PM

## 2015-06-06 ENCOUNTER — Ambulatory Visit (HOSPITAL_COMMUNITY)
Admission: RE | Admit: 2015-06-06 | Discharge: 2015-06-06 | Disposition: A | Discharge status: Home / Self Care | Payer: Medicare Other | Source: Ambulatory Visit | Attending: Hematology | Admitting: Hematology

## 2015-06-06 DIAGNOSIS — Z85528 Personal history of other malignant neoplasm of kidney: Secondary | ICD-10-CM | POA: Diagnosis not present

## 2015-06-06 DIAGNOSIS — C7900 Secondary malignant neoplasm of unspecified kidney and renal pelvis: Secondary | ICD-10-CM

## 2015-06-06 DIAGNOSIS — I10 Essential (primary) hypertension: Secondary | ICD-10-CM | POA: Diagnosis not present

## 2015-06-06 DIAGNOSIS — I2510 Atherosclerotic heart disease of native coronary artery without angina pectoris: Secondary | ICD-10-CM | POA: Diagnosis not present

## 2015-06-06 DIAGNOSIS — Z905 Acquired absence of kidney: Secondary | ICD-10-CM | POA: Diagnosis not present

## 2015-06-06 DIAGNOSIS — I517 Cardiomegaly: Secondary | ICD-10-CM | POA: Insufficient documentation

## 2015-06-06 DIAGNOSIS — E119 Type 2 diabetes mellitus without complications: Secondary | ICD-10-CM | POA: Diagnosis not present

## 2015-06-06 DIAGNOSIS — N200 Calculus of kidney: Secondary | ICD-10-CM | POA: Diagnosis not present

## 2015-06-06 DIAGNOSIS — E890 Postprocedural hypothyroidism: Secondary | ICD-10-CM | POA: Diagnosis not present

## 2015-06-06 DIAGNOSIS — R918 Other nonspecific abnormal finding of lung field: Secondary | ICD-10-CM | POA: Diagnosis not present

## 2015-06-06 DIAGNOSIS — R911 Solitary pulmonary nodule: Secondary | ICD-10-CM | POA: Diagnosis present

## 2015-06-06 LAB — GLUCOSE, CAPILLARY: Glucose-Capillary: 254 mg/dL — ABNORMAL HIGH (ref 65–99)

## 2015-06-06 MED ORDER — FLUDEOXYGLUCOSE F - 18 (FDG) INJECTION
9.5300 | Freq: Once | INTRAVENOUS | Status: DC | PRN
  Administered 2015-06-06: 9.53 via INTRAVENOUS
  Filled 2015-06-06: qty 9.53

## 2015-06-07 ENCOUNTER — Ambulatory Visit (HOSPITAL_BASED_OUTPATIENT_CLINIC_OR_DEPARTMENT_OTHER): Payer: Medicare Other | Admitting: Hematology

## 2015-06-07 ENCOUNTER — Encounter: Payer: Self-pay | Admitting: Hematology

## 2015-06-07 ENCOUNTER — Telehealth: Payer: Self-pay | Admitting: Hematology

## 2015-06-07 VITALS — BP 141/68 | HR 54 | Temp 97.8°F | Resp 18 | Ht 69.5 in | Wt 202.4 lb

## 2015-06-07 DIAGNOSIS — M069 Rheumatoid arthritis, unspecified: Secondary | ICD-10-CM

## 2015-06-07 DIAGNOSIS — Z85850 Personal history of malignant neoplasm of thyroid: Secondary | ICD-10-CM | POA: Diagnosis not present

## 2015-06-07 DIAGNOSIS — Z8553 Personal history of malignant neoplasm of renal pelvis: Secondary | ICD-10-CM

## 2015-06-07 DIAGNOSIS — C7900 Secondary malignant neoplasm of unspecified kidney and renal pelvis: Secondary | ICD-10-CM

## 2015-06-07 DIAGNOSIS — C7800 Secondary malignant neoplasm of unspecified lung: Secondary | ICD-10-CM | POA: Diagnosis not present

## 2015-06-07 DIAGNOSIS — R911 Solitary pulmonary nodule: Secondary | ICD-10-CM

## 2015-06-07 NOTE — Telephone Encounter (Signed)
Gave and printed appt sched and avs fo rpt; for NOV  °

## 2015-06-08 ENCOUNTER — Telehealth: Payer: Self-pay | Admitting: Hematology

## 2015-06-08 NOTE — Telephone Encounter (Signed)
Faxed pt office note to [facility]

## 2015-06-11 ENCOUNTER — Encounter: Payer: Self-pay | Admitting: Hematology

## 2015-06-11 NOTE — Progress Notes (Signed)
Dylan Jefferson  HEMATOLOGY ONCOLOGY PROGRESS NOTE  Date of service: .06/07/2015  Patient Care Team: [name] as PCP - General (Surgery)  Diagnosis:  1) multiple lung metastases concerning for metastatic renal cell carcinoma. 2) remote history of metastatic renal cell carcinoma Diagnosed with renal cell carcinoma 20 years ago and had a right nephrectomy. About 8 years after that he was noted to have abdominal recurrence in his pancreas spleen and small intestine and had a Whipple's procedure [May 2001] and significant abdominal surgery and notes that 10 out of 17 lymph nodes were positive. He also had his gallbladder removed. Postoperative course was complicated by an internal hemorrhage as per his report. Patient notes 2-3 years after that he had recurrence in his thyroid that led to a thyroidectomy. [June 2004] later he had partial gastrectomy for local recurrence. [February 2005] when he presented with GI bleeding. Patient notes that he has had no known evidence of recurrence over the last 8 years until his recent CT scan showed lung nodules.  Current Treatment: Evaluation of new lung nodules  INTERVAL HISTORY:  Dylan Jefferson is here for follow-up of his PET/CT and CT of the head results. Blood sugars have been somewhat uncontrolled and he feels a little under the weather and fatigued. No other acute symptoms.  REVIEW OF SYSTEMS:    10 Point review of systems of done and is negative except as noted above.  . Past Medical History  Diagnosis Date  . Arthritis   . Kidney disease   . Diabetes ([place])   . Depression   . Hypertension   . Hypothyroidism   . Cancer [facility])     Renal cell  . Rheumatoid arthritis ([place])   . Nephrolithiasis     . Past Surgical History  Procedure Laterality Date  . Thyroidectomy    . Gastrectomy      tumor removed  . Whipple procedure    . Nephrectomy Right     . Social History  Substance Use Topics  . Smoking status: Never Smoker   . Smokeless tobacco:  Never Used  . Alcohol Use: Yes     Comment: occasional beer    ALLERGIES:  has No Known Allergies.  MEDICATIONS:  Current Outpatient Prescriptions  Medication Sig Dispense Refill  . amLODipine (NORVASC) 5 MG tablet     . Ascorbic Acid (VITAMIN C) 1000 MG tablet Take 1,000 mg by mouth daily.    Dylan Jefferson aspirin 81 MG tablet Take 81 mg by mouth daily.    Dylan Jefferson atenolol (TENORMIN) 100 MG tablet     . augmented betamethasone dipropionate (DIPROLENE-AF) 0.05 % cream   0  . calcium citrate-vitamin D (CITRACAL+D) 315-200 MG-UNIT per tablet Take 2 tablets by mouth daily.     . Cyanocobalamin (VITAMIN B-12) 2500 MCG SUBL Place under the tongue daily.    . famotidine (PEPCID) 20 MG tablet Take 20 mg by mouth 2 (two) times daily.    . ferrous sulfate 325 (65 FE) MG tablet Take 325 mg by mouth 2 (two) times daily with a meal.     . Glucosamine-MSM-Hyaluronic Acd (JOINT HEALTH) 750-375-30 MG TABS Take 2 tablets by mouth daily.    . hydroxychloroquine (PLAQUENIL) 200 MG tablet   0  . insulin lispro (HUMALOG) 100 UNIT/ML injection Inject into the skin 3 (three) times daily before meals.    . Iron-Vitamins (GERITOL COMPLETE) TABS Take 1 tablet by mouth daily.    . Lactobacillus (ACIDOPHILUS) CAPS capsule Take 1 capsule by mouth daily.    Dylan Jefferson  LEVEMIR 100 UNIT/ML injection Inject 7 Units into the skin 2 (two) times daily.   1  . levothyroxine (SYNTHROID, LEVOTHROID) 300 MCG tablet     . lovastatin (MEVACOR) 40 MG tablet     . minoxidil (LONITEN) 10 MG tablet     . Multiple Vitamin (MULTIVITAMIN) tablet Take 1 tablet by mouth daily.    . Omega-3 Fatty Acids (FISH OIL) 1200 MG CAPS Take 1 capsule by mouth daily.    . Pancrelipase, Lip-Prot-Amyl, (CREON) 6000 UNITS CPEP Take 4 capsules by mouth 3 (three) times daily.    . potassium citrate (UROCIT-K) 10 MEQ (1080 MG) SR tablet     . valsartan-hydrochlorothiazide (DIOVAN-HCT) 160-12.5 MG per tablet      No current facility-administered medications for this visit.    Facility-Administered Medications Ordered in Other Visits  Medication Dose Route Frequency Provider Last Rate Last Dose  . fludeoxyglucose F - 18 (FDG) injection 9.53 milli Curie  9.53 milli Curie Intravenous Once PRN Brunetta Genera, MD   9.53 milli Curie at 06/06/15 [id number]    PHYSICAL EXAMINATION: ECOG PERFORMANCE STATUS: 1 - Symptomatic but completely ambulatory  . Filed Vitals:   06/07/15 1548  BP: 141/68  Pulse: 54  Temp: 97.8 F (36.6 C)  Resp: 18    Filed Weights   06/07/15 1548  Weight: 202 lb 6.4 oz (91.808 kg)   .Body mass index is 29.47 kg/(m^2).  GENERAL:alert, in no acute distress and comfortable SKIN: skin color, texture, turgor are normal, no rashes or significant lesions EYES: normal, conjunctiva are pink and non-injected, sclera clear OROPHARYNX:no exudate, no erythema and lips, buccal mucosa, and tongue normal  NECK: supple, no JVD, thyroid normal size, non-tender, without nodularity LYMPH:  no palpable lymphadenopathy in the cervical, axillary or inguinal LUNGS: clear to auscultation with normal respiratory effort HEART: regular rate & rhythm,  no murmurs and no lower extremity edema ABDOMEN: abdomen soft, non-tender, normoactive bowel sounds  Musculoskeletal: no cyanosis of digits and no clubbing  PSYCH: alert & oriented x 3 with fluent speech NEURO: no focal motor/sensory deficits  LABORATORY DATA:   I have reviewed the data as listed  . CBC Latest Ref Rng 05/24/2015 10/31/2010 10/30/2010  WBC 4.0 - 10.3 10e3/uL 6.7 6.9 7.6  Hemoglobin 13.0 - 17.1 g/dL 14.1 12.3(L) 11.2(L)  Hematocrit 38.4 - 49.9 % 40.8 36.2(L) 33.3(L)  Platelets 140 - 400 10e3/uL 207 224 190    . CMP Latest Ref Rng 05/24/2015 10/31/2010 10/30/2010  Glucose 70 - 140 mg/dl 337(H) 163(H) 67(L)  BUN 7.0 - 26.0 mg/dL 13.1 6 7   Creatinine 0.7 - 1.3 mg/dL 1.5(H) 0.92 0.97  Sodium 136 - 145 mEq/L 140 140 143  Potassium 3.5 - 5.1 mEq/L 4.1 3.8 3.6  Chloride 96 - 112 mEq/L - 105  112  CO2 22 - 29 mEq/L 25 31 28   Calcium 8.4 - 10.4 mg/dL 9.2 8.0(L) 7.5(L)  Total Protein 6.4 - 8.3 g/dL 6.5 5.3(L) 4.9(L)  Total Bilirubin 0.20 - 1.20 mg/dL 1.12 0.9 0.9  Alkaline Phos 40 - 150 U/L 109 57 50  AST 5 - 34 U/L 29 21 19   ALT 0 - 55 U/L 43 21 16     RADIOGRAPHIC STUDIES: I have personally reviewed the radiological images as listed and agreed with the findings in the report. Ct Head Wo Contrast  06/06/2015  CLINICAL DATA:  Renal cell carcinoma question metastatic disease, diabetes mellitus, hypertension EXAM: CT HEAD WITHOUT CONTRAST TECHNIQUE: Contiguous axial images were obtained  from the base of the skull through the vertex without intravenous contrast. COMPARISON:  02/12/2004 FINDINGS: Mild generalized atrophy. Normal ventricular morphology. No midline shift or mass effect. Otherwise normal appearance of brain parenchyma. No intracranial hemorrhage, mass lesion, or evidence acute infarction. No extra-axial fluid collections. Visualized paranasal sinuses and mastoid air cells clear. Bones unremarkable. IMPRESSION: Normal noncontrast CT head. Electronically Signed   By: Lavonia Dana M.D.   On: 06/06/2015 08:56   Nm Pet Image Initial (pi) Skull Base To Thigh  06/06/2015  CLINICAL DATA:  Initial treatment strategy for evaluate lung nodule. History of renal cell carcinoma. EXAM: NUCLEAR MEDICINE PET SKULL BASE TO THIGH TECHNIQUE: 9.5 mCi F-18 FDG was injected intravenously. Full-ring PET imaging was performed from the skull base to thigh after the radiotracer. CT data was obtained and used for attenuation correction and anatomic localization. FASTING BLOOD GLUCOSE:  Value: 254 mg/dl COMPARISON:  Chest CT of 05/09/2015.  PET of 02/18/2008. FINDINGS: Mild degradation secondary to elevated blood glucose and increased soft tissue uptake. NECK No areas of abnormal hypermetabolism. CHEST The dominant right middle lobe pulmonary nodule is hypermetabolic. Measures 2.0 cm and a S.U.V. max of 6.1  (image 86, series 4). Index left lower lobe pulmonary nodule measures 11 mm and a S.U.V. max of 2.0 (image 112, series 4). No thoracic nodal hypermetabolism. ABDOMEN/PELVIS No intra-abdominal or intrapelvic hypermetabolism. Anterior abdominal wall subcutaneous hypermetabolism is likely related to injection sites. SKELETON No abnormal marrow activity. Note is made of degenerative arthropathy at both acromioclavicular joints. CT IMAGES PERFORMED FOR ATTENUATION CORRECTION Left carotid atherosclerosis.  No cervical adenopathy. Thyroidectomy. Chest findings deferred to recent diagnostic CT. Mild cardiomegaly. LAD coronary artery atherosclerosis. Other smaller pulmonary nodules which are below the resolution of PET. Lower pole left renal collecting system calculus. Right nephrectomy. Pneumobilia. Atrophy versus surgical absence of portions of the pancreas. Suboptimally evaluated. Apparent bladder wall thickening, possibly due to underdistention. Mild prostatomegaly. IMPRESSION: 1. Bilateral pulmonary nodules, including a hypermetabolic dominant right middle lobe nodule. Differential considerations include primary bronchogenic carcinoma with pulmonary metastasis versus delayed metastasis from renal cell carcinoma. Consider tissue sampling of the right middle lobe dominant nodule. 2. Otherwise, no evidence of hypermetabolic metastatic disease after right nephrectomy. 3. Incidental findings, including coronary artery atherosclerosis, left nephrolithiasis, and prostatomegaly. 4. Decreased sensitivity and specificity exam due to technique related factors, as described above. Electronically Signed   By: Abigail Miyamoto M.D.   On: 06/06/2015 09:11    ASSESSMENT & PLAN:  64 year old Caucasian male with  #1 Remote history of metastatic renal cell carcinoma status post right-sided nephrectomy with multiple recurrences treated with surgical excision as detailed above. He has been NED status for the last 8 years. He stopped  following up with the cancer Center due to concerns about medical costs. He has been referred back to [place] by Dr. Sharlett Iles after recent CT of the chest showed multiple lung nodules concerning for neoplastic etiology.  PET/CT positive pulmonary nodule. 2 cm right middle lobe lung nodule appears to be the most prominent and FDG avid. His blood sugars prior to the PET/CT scan were elevated which might reduce the sensitivity of the PET scan.  No overt extrathoracic metastatic disease noted.  CT head unremarkable for evidence of metastatic disease.  Plan.  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 #4 hypothyroidism related to his thyroidectomy #5 single kidney with chronic kidney disease. #6 nephrolithiasis. 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 #8 rheumatoid arthritis on Plaquenil Plan  -continue follow-up with primary care physician regarding these other medical problems  Return to care with Dr. Irene Limbo 3-4 days after CT-guided lung nodule biopsy. I spent 15 minutes counseling the patient face to face. The total time spent in the appointment was 15 minutes and more than 50% was on counseling and direct patient cares.    Sullivan Lone MD Eckhart Mines AAHIVMS [facility] Hematology/Oncology Physician [facility]  (Office):       [phone] (Work cell):  [phone] (Fax):           [phone]

## 2015-06-12 ENCOUNTER — Other Ambulatory Visit: Payer: Self-pay | Admitting: Radiology

## 2015-06-13 ENCOUNTER — Ambulatory Visit (HOSPITAL_COMMUNITY)
Admission: RE | Admit: 2015-06-13 | Discharge: 2015-06-13 | Disposition: A | Discharge status: Home / Self Care | Payer: Medicare Other | Source: Ambulatory Visit | Attending: Hematology | Admitting: Hematology

## 2015-06-13 ENCOUNTER — Encounter (HOSPITAL_COMMUNITY): Payer: Self-pay

## 2015-06-13 DIAGNOSIS — M069 Rheumatoid arthritis, unspecified: Secondary | ICD-10-CM | POA: Insufficient documentation

## 2015-06-13 DIAGNOSIS — Z905 Acquired absence of kidney: Secondary | ICD-10-CM | POA: Insufficient documentation

## 2015-06-13 DIAGNOSIS — E1165 Type 2 diabetes mellitus with hyperglycemia: Secondary | ICD-10-CM | POA: Diagnosis not present

## 2015-06-13 DIAGNOSIS — Z85528 Personal history of other malignant neoplasm of kidney: Secondary | ICD-10-CM | POA: Diagnosis not present

## 2015-06-13 DIAGNOSIS — N289 Disorder of kidney and ureter, unspecified: Secondary | ICD-10-CM | POA: Diagnosis not present

## 2015-06-13 DIAGNOSIS — C7900 Secondary malignant neoplasm of unspecified kidney and renal pelvis: Secondary | ICD-10-CM

## 2015-06-13 DIAGNOSIS — E039 Hypothyroidism, unspecified: Secondary | ICD-10-CM | POA: Diagnosis not present

## 2015-06-13 DIAGNOSIS — R911 Solitary pulmonary nodule: Secondary | ICD-10-CM | POA: Diagnosis present

## 2015-06-13 DIAGNOSIS — Z87442 Personal history of urinary calculi: Secondary | ICD-10-CM | POA: Diagnosis not present

## 2015-06-13 DIAGNOSIS — F329 Major depressive disorder, single episode, unspecified: Secondary | ICD-10-CM | POA: Diagnosis not present

## 2015-06-13 DIAGNOSIS — Z9889 Other specified postprocedural states: Secondary | ICD-10-CM | POA: Diagnosis present

## 2015-06-13 LAB — CBC WITH DIFFERENTIAL/PLATELET
BASOS PCT: 1 %
Basophils Absolute: 0.1 10*3/uL (ref 0.0–0.1)
Eosinophils Absolute: 0.2 10*3/uL (ref 0.0–0.7)
Eosinophils Relative: 3 %
HEMATOCRIT: 43.1 % (ref 39.0–52.0)
HEMOGLOBIN: 14.8 g/dL (ref 13.0–17.0)
LYMPHS ABS: 2 10*3/uL (ref 0.7–4.0)
LYMPHS PCT: 28 %
MCH: 33.9 pg (ref 26.0–34.0)
MCHC: 34.3 g/dL (ref 30.0–36.0)
MCV: 98.9 fL (ref 78.0–100.0)
MONO ABS: 0.9 10*3/uL (ref 0.1–1.0)
MONOS PCT: 13 %
NEUTROS ABS: 4.1 10*3/uL (ref 1.7–7.7)
NEUTROS PCT: 55 %
Platelets: 214 10*3/uL (ref 150–400)
RBC: 4.36 MIL/uL (ref 4.22–5.81)
RDW: 12.8 % (ref 11.5–15.5)
WBC: 7.3 10*3/uL (ref 4.0–10.5)

## 2015-06-13 LAB — BASIC METABOLIC PANEL
ANION GAP: 8 (ref 5–15)
BUN: 13 mg/dL (ref 6–20)
CALCIUM: 9.3 mg/dL (ref 8.9–10.3)
CHLORIDE: 109 mmol/L (ref 101–111)
CO2: 23 mmol/L (ref 22–32)
Creatinine, Ser: 1.09 mg/dL (ref 0.61–1.24)
GFR calc non Af Amer: >60 mL/min (ref >60)
GLUCOSE: 174 mg/dL — AB (ref 65–99)
Potassium: 3.7 mmol/L (ref 3.5–5.1)
Sodium: 140 mmol/L (ref 135–145)

## 2015-06-13 LAB — APTT: aPTT: 30 seconds (ref 24–37)

## 2015-06-13 LAB — PROTIME-INR
INR: 0.92 (ref 0.00–1.49)
Prothrombin Time: 12.6 seconds (ref 11.6–15.2)

## 2015-06-13 LAB — GLUCOSE, CAPILLARY: Glucose-Capillary: 158 mg/dL — ABNORMAL HIGH (ref 65–99)

## 2015-06-13 MED ORDER — MIDAZOLAM HCL 2 MG/2ML IJ SOLN
INTRAMUSCULAR | Status: AC | PRN
  Administered 2015-06-13: 0.5 mg via INTRAVENOUS

## 2015-06-13 MED ORDER — IBUPROFEN 800 MG PO TABS
800.0000 mg | ORAL_TABLET | ORAL | Status: AC
  Administered 2015-06-13: 800 mg via ORAL
  Filled 2015-06-13: qty 1
  Filled 2015-06-13: qty 4

## 2015-06-13 MED ORDER — FENTANYL CITRATE (PF) 100 MCG/2ML IJ SOLN
INTRAMUSCULAR | Status: AC
  Filled 2015-06-13: qty 2

## 2015-06-13 MED ORDER — SODIUM CHLORIDE 0.9 % IV SOLN
INTRAVENOUS | Status: DC
  Administered 2015-06-13: 09:00:00 via INTRAVENOUS

## 2015-06-13 MED ORDER — FENTANYL CITRATE (PF) 100 MCG/2ML IJ SOLN
INTRAMUSCULAR | Status: AC | PRN
  Administered 2015-06-13: 25 ug via INTRAVENOUS

## 2015-06-13 MED ORDER — MIDAZOLAM HCL 2 MG/2ML IJ SOLN
INTRAMUSCULAR | Status: AC
  Filled 2015-06-13: qty 4

## 2015-06-13 NOTE — Discharge Instructions (Signed)
Needle Biopsy of Lung, Care After Refer to this sheet in the next few weeks. These instructions provide you with information on caring for yourself after your procedure. Your health care provider may also give you more specific instructions. Your treatment has been planned according to current medical practices, but problems sometimes occur. Call your health care provider if you have any problems or questions after your procedure. WHAT TO EXPECT AFTER THE PROCEDURE  A bandage will be applied over the area where the needle was inserted. You may be asked to apply pressure to the bandage for several minutes to ensure there is minimal bleeding.  In most cases, you can leave when your needle biopsy procedure is completed. Do not drive yourself home. Someone else should take you home.  If you received an IV sedative or general anesthetic, you will be taken to a comfortable place to relax while the medicine wears off.  If you have upcoming travel scheduled, talk to your health care provider about when it is safe to travel by air after the procedure. HOME CARE INSTRUCTIONS  Expect to take it easy for the rest of the day.  Protect the area where you received the needle biopsy by keeping the bandage in place for as long as instructed.  You may feel some mild pain or discomfort in the area, but this should stop in a day or two.  Take medicines only as directed by your health care provider. SEEK MEDICAL CARE IF:   You have pain at the biopsy site that worsens or is not helped by medicine.  You have swelling or drainage at the needle biopsy site.  You have a fever. SEEK IMMEDIATE MEDICAL CARE IF:   You have new or worsening shortness of breath.  You have chest pain.  You are coughing up blood.  You have bleeding that does not stop with pressure or a bandage.  You develop light-headedness or fainting.   This information is not intended to replace advice given to you by your health care  provider. Make sure you discuss any questions you have with your health care provider.   Document Released: 05/18/2007 Document Revised: 08/11/2014 Document Reviewed: 12/13/2012 Elsevier Interactive Patient Education 2016 Else[employer].Moderate Conscious Sedation, Adult, Care After Refer to this sheet in the next few weeks. These instructions provide you with information on caring for yourself after your procedure. Your health care provider may also give you more specific instructions. Your treatment has been planned according to current medical practices, but problems sometimes occur. Call your health care provider if you have any problems or questions after your procedure. WHAT TO EXPECT AFTER THE PROCEDURE  After your procedure:  You may feel sleepy, clumsy, and have poor balance for several hours.  Vomiting may occur if you eat too soon after the procedure. HOME CARE INSTRUCTIONS  Do not participate in any activities where you could become injured for at least 24 hours. Do not:  Drive.  Swim.  Ride a bicycle.  Operate heavy machinery.  Cook.  Use power tools.  Climb ladders.  Work from a high place.  Do not make important decisions or sign legal documents until you are improved.  If you vomit, drink water, juice, or soup when you can drink without vomiting. Make sure you have little or no nausea before eating solid foods.  Only take over-the-counter or prescription medicines for pain, discomfort, or fever as directed by your health care provider.  Make sure you and your family  fully understand everything about the medicines given to you, including what side effects may occur.  You should not drink alcohol, take sleeping pills, or take medicines that cause drowsiness for at least 24 hours.  If you smoke, do not smoke without supervision.  If you are feeling better, you may resume normal activities 24 hours after you were sedated.  Keep all appointments with your health  care provider. SEEK MEDICAL CARE IF:  Your skin is pale or bluish in color.  You continue to feel nauseous or vomit.  Your pain is getting worse and is not helped by medicine.  You have bleeding or swelling.  You are still sleepy or feeling clumsy after 24 hours. SEEK IMMEDIATE MEDICAL CARE IF:  You develop a rash.  You have difficulty breathing.  You develop any type of allergic problem.  You have a fever. MAKE SURE YOU:  Understand these instructions.  Will watch your condition.  Will get help right away if you are not doing well or get worse.   This information is not intended to replace advice given to you by your health care provider. Make sure you discuss any questions you have with your health care provider.   Document Released: 05/11/2013 Document Revised: 08/11/2014 Document Reviewed: 05/11/2013 Elsevier Interactive Patient Education [employer].

## 2015-06-13 NOTE — Procedures (Signed)
Interventional Radiology Procedure Note  Procedure: CT guided biopsy of right middle lobe nodule.  3 x 18G core.  Biosentry deployed  Complications: None Recommendations: - Bedrest until CXR cleared.  Minimize talking, coughing or otherwise straining.  - Follow up 2 hr CXR pending   Signed,  Wanette Robison S. Earleen Newport, DO

## 2015-06-13 NOTE — H&P (Signed)
Chief Complaint: Patient was seen in consultation today for lung nodule at the request of Brunetta Genera  Referring Physician(s): Brunetta Genera  History of Present Illness: Dylan Jefferson is a 64 y.o. male with history of metastatic renal cell cancer and with right mid lobe lung nodule, hypermetabolic on PET. He has been seen by Dr. Irene Limbo on 06/11/15 and scheduled today for image guided RML lung nodule biopsy. He denies any chest pain, shortness of breath or palpitations. He denies any active signs of bleeding or excessive bruising. He denies any recent fever or chills. The patient denies any history of sleep apnea or chronic oxygen use. He has previously tolerated sedation without complications.    Past Medical History  Diagnosis Date  . Arthritis   . Kidney disease   . Diabetes ([place])   . Depression   . Hypertension   . Hypothyroidism   . Cancer [facility])     Renal cell  . Rheumatoid arthritis ([place])   . Nephrolithiasis     Past Surgical History  Procedure Laterality Date  . Thyroidectomy    . Gastrectomy      tumor removed  . Whipple procedure    . Nephrectomy Right     Allergies: Review of patient's allergies indicates no known allergies.  Medications: Prior to Admission medications   Medication Sig Start Date End Date Taking? Authorizing Provider  amLODipine (NORVASC) 5 MG tablet  01/18/14  Yes Historical Provider, MD  Ascorbic Acid (VITAMIN C) 1000 MG tablet Take 1,000 mg by mouth daily.   Yes Historical Provider, MD  aspirin 81 MG tablet Take 81 mg by mouth daily.   Yes Historical Provider, MD  atenolol (TENORMIN) 100 MG tablet  01/16/14  Yes Historical Provider, MD  calcium citrate-vitamin D (CITRACAL+D) 315-200 MG-UNIT per tablet Take 2 tablets by mouth daily.    Yes Historical Provider, MD  Cyanocobalamin (VITAMIN B-12) 2500 MCG SUBL Place under the tongue daily.   Yes Historical Provider, MD  famotidine (PEPCID) 20 MG tablet Take 20 mg by mouth 2  (two) times daily.   Yes Historical Provider, MD  ferrous sulfate 325 (65 FE) MG tablet Take 325 mg by mouth 2 (two) times daily with a meal.    Yes Historical Provider, MD  Glucosamine-MSM-Hyaluronic Acd (JOINT HEALTH) 750-375-30 MG TABS Take 2 tablets by mouth daily.   Yes Historical Provider, MD  hydroxychloroquine (PLAQUENIL) 200 MG tablet  05/11/15  Yes Historical Provider, MD  insulin lispro (HUMALOG) 100 UNIT/ML injection Inject into the skin 3 (three) times daily before meals.   Yes Historical Provider, MD  Iron-Vitamins (GERITOL COMPLETE) TABS Take 1 tablet by mouth daily.   Yes Historical Provider, MD  Lactobacillus (ACIDOPHILUS) CAPS capsule Take 1 capsule by mouth daily.   Yes Historical Provider, MD  LEVEMIR 100 UNIT/ML injection Inject 7 Units into the skin 2 (two) times daily.  03/05/15  Yes Historical Provider, MD  levothyroxine (SYNTHROID, LEVOTHROID) 300 MCG tablet  01/19/14  Yes Historical Provider, MD  lovastatin (MEVACOR) 40 MG tablet  01/18/14  Yes Historical Provider, MD  minoxidil (LONITEN) 10 MG tablet  02/22/14  Yes Historical Provider, MD  Multiple Vitamin (MULTIVITAMIN) tablet Take 1 tablet by mouth daily.   Yes Historical Provider, MD  Omega-3 Fatty Acids (FISH OIL) 1200 MG CAPS Take 1 capsule by mouth daily.   Yes Historical Provider, MD  Pancrelipase, Lip-Prot-Amyl, (CREON) 6000 UNITS CPEP Take 4 capsules by mouth 3 (three) times daily.  Yes Historical Provider, MD  potassium citrate (UROCIT-K) 10 MEQ (1080 MG) SR tablet  01/24/14  Yes Historical Provider, MD  traMADol (ULTRAM) 50 MG tablet Take by mouth as needed.   Yes Historical Provider, MD  valsartan-hydrochlorothiazide (DIOVAN-HCT) 160-12.5 MG per tablet  01/18/14  Yes Historical Provider, MD  augmented betamethasone dipropionate (DIPROLENE-AF) 0.05 % cream  03/05/15   Historical Provider, MD  HYDROcodone-acetaminophen (NORCO/VICODIN) 5-325 MG tablet Take 1-2 tablets by mouth every 4 (four) hours as needed for moderate  pain.    Historical Provider, MD     Family History  Problem Relation Age of Onset  . Colon cancer Neg Hx   . Bleeding Disorder Mother     Unknown what type  . Osteoporosis Mother   . Lymphoma Father     Hodgkin's lymphoma    Social History   Social History  . Marital Status: Married    Spouse Name: N/A  . Number of Children: 1  . Years of Education: N/A   Occupational History  . Disabled    Social History Main Topics  . Smoking status: Never Smoker   . Smokeless tobacco: Never Used  . Alcohol Use: Yes     Comment: occasional beer  . Drug Use: No  . Sexual Activity: Not Asked   Other Topics Concern  . None   Social History Narrative    Review of Systems: A 12 point ROS discussed and pertinent positives are indicated in the HPI above.  All other systems are negative.  Review of Systems  Vital Signs: BP 136/80 mmHg  Pulse 51  Temp(Src) 97.5 F (36.4 C) (Oral)  Resp 18  SpO2 98%  Physical Exam  Constitutional: He is oriented to person, place, and time. No distress.  HENT:  Head: Normocephalic and atraumatic.  Cardiovascular: Normal rate and regular rhythm.  Exam reveals no gallop and no friction rub.   No murmur heard. Pulmonary/Chest: Effort normal and breath sounds normal. No respiratory distress. He has no wheezes. He has no rales.  Abdominal: Soft. Bowel sounds are normal. He exhibits no distension. There is no tenderness.  Neurological: He is alert and oriented to person, place, and time.  Skin: Skin is warm and dry. He is not diaphoretic.    Mallampati Score:  MD Evaluation Airway: WNL Heart: WNL Abdomen: WNL Chest/ Lungs: WNL ASA  Classification: 3 Mallampati/Airway Score: Two  Imaging: Ct Head Wo Contrast  06/06/2015  CLINICAL DATA:  Renal cell carcinoma question metastatic disease, diabetes mellitus, hypertension EXAM: CT HEAD WITHOUT CONTRAST TECHNIQUE: Contiguous axial images were obtained from the base of the skull through the vertex  without intravenous contrast. COMPARISON:  02/12/2004 FINDINGS: Mild generalized atrophy. Normal ventricular morphology. No midline shift or mass effect. Otherwise normal appearance of brain parenchyma. No intracranial hemorrhage, mass lesion, or evidence acute infarction. No extra-axial fluid collections. Visualized paranasal sinuses and mastoid air cells clear. Bones unremarkable. IMPRESSION: Normal noncontrast CT head. Electronically Signed   By: Lavonia Dana M.D.   On: 06/06/2015 08:56   Nm Pet Image Initial (pi) Skull Base To Thigh  06/06/2015  CLINICAL DATA:  Initial treatment strategy for evaluate lung nodule. History of renal cell carcinoma. EXAM: NUCLEAR MEDICINE PET SKULL BASE TO THIGH TECHNIQUE: 9.5 mCi F-18 FDG was injected intravenously. Full-ring PET imaging was performed from the skull base to thigh after the radiotracer. CT data was obtained and used for attenuation correction and anatomic localization. FASTING BLOOD GLUCOSE:  Value: 254 mg/dl COMPARISON:  Chest  CT of 05/09/2015.  PET of 02/18/2008. FINDINGS: Mild degradation secondary to elevated blood glucose and increased soft tissue uptake. NECK No areas of abnormal hypermetabolism. CHEST The dominant right middle lobe pulmonary nodule is hypermetabolic. Measures 2.0 cm and a S.U.V. max of 6.1 (image 86, series 4). Index left lower lobe pulmonary nodule measures 11 mm and a S.U.V. max of 2.0 (image 112, series 4). No thoracic nodal hypermetabolism. ABDOMEN/PELVIS No intra-abdominal or intrapelvic hypermetabolism. Anterior abdominal wall subcutaneous hypermetabolism is likely related to injection sites. SKELETON No abnormal marrow activity. Note is made of degenerative arthropathy at both acromioclavicular joints. CT IMAGES PERFORMED FOR ATTENUATION CORRECTION Left carotid atherosclerosis.  No cervical adenopathy. Thyroidectomy. Chest findings deferred to recent diagnostic CT. Mild cardiomegaly. LAD coronary artery atherosclerosis. Other smaller  pulmonary nodules which are below the resolution of PET. Lower pole left renal collecting system calculus. Right nephrectomy. Pneumobilia. Atrophy versus surgical absence of portions of the pancreas. Suboptimally evaluated. Apparent bladder wall thickening, possibly due to underdistention. Mild prostatomegaly. IMPRESSION: 1. Bilateral pulmonary nodules, including a hypermetabolic dominant right middle lobe nodule. Differential considerations include primary bronchogenic carcinoma with pulmonary metastasis versus delayed metastasis from renal cell carcinoma. Consider tissue sampling of the right middle lobe dominant nodule. 2. Otherwise, no evidence of hypermetabolic metastatic disease after right nephrectomy. 3. Incidental findings, including coronary artery atherosclerosis, left nephrolithiasis, and prostatomegaly. 4. Decreased sensitivity and specificity exam due to technique related factors, as described above. Electronically Signed   By: Abigail Miyamoto M.D.   On: 06/06/2015 09:11    Labs:  CBC:  Recent Labs  05/24/15 1547 06/13/15 0915  WBC 6.7 7.3  HGB 14.1 14.8  HCT 40.8 43.1  PLT 207 214    COAGS:  Recent Labs  06/13/15 0915  INR 0.92  APTT 30    BMP:  Recent Labs  05/24/15 1548 06/13/15 0915  NA 140 140  K 4.1 3.7  CL  --  109  CO2 25 23  GLUCOSE 337* 174*  BUN 13.1 13  CALCIUM 9.2 9.3  CREATININE 1.5* 1.09  GFRNONAA  --  >60  GFRAA  --  >60    LIVER FUNCTION TESTS:  Recent Labs  05/24/15 1548  BILITOT 1.12  AST 29  ALT 43  ALKPHOS 109  PROT 6.5  ALBUMIN 4.0    Assessment and Plan: Right mid lobe lung nodule, hypermetabolic on PET Seen by Dr. Irene Limbo on 06/11/15 History of metastatic renal cell carcinoma Scheduled today for image guided RML lung nodule biopsy with sedation The patient has been NPO, no blood thinners taken, labs and vitals have been reviewed.\ Risks and Benefits discussed with the patient including, but not limited to bleeding,  hemoptysis, respiratory failure requiring intubation, infection, pneumothorax requiring chest tube placement, stroke from air embolism or even death. All of the patient's questions were answered, patient is agreeable to proceed. Consent signed and in chart.     Thank you for this interesting consult.  I greatly enjoyed meeting Dylan Jefferson and look forward to participating in their care.  A copy of this report was sent to the requesting provider on this date.  SignedHedy Jacob 06/13/2015, 10:49 AM   I spent a total of 15 Minutes in face to face in clinical consultation, greater than 50% of which was counseling/coordinating care for lung nodule.

## 2015-06-20 ENCOUNTER — Other Ambulatory Visit: Payer: Self-pay | Admitting: *Deleted

## 2015-06-21 ENCOUNTER — Other Ambulatory Visit (HOSPITAL_BASED_OUTPATIENT_CLINIC_OR_DEPARTMENT_OTHER): Payer: Medicare Other

## 2015-06-21 ENCOUNTER — Encounter: Payer: Self-pay | Admitting: Hematology

## 2015-06-21 ENCOUNTER — Ambulatory Visit (HOSPITAL_BASED_OUTPATIENT_CLINIC_OR_DEPARTMENT_OTHER): Payer: Medicare Other | Admitting: Hematology

## 2015-06-21 VITALS — BP 152/74 | HR 60 | Temp 98.2°F | Resp 18 | Ht 69.5 in | Wt 200.4 lb

## 2015-06-21 DIAGNOSIS — C7900 Secondary malignant neoplasm of unspecified kidney and renal pelvis: Secondary | ICD-10-CM

## 2015-06-21 DIAGNOSIS — C7901 Secondary malignant neoplasm of right kidney and renal pelvis: Secondary | ICD-10-CM | POA: Diagnosis not present

## 2015-06-21 DIAGNOSIS — C7800 Secondary malignant neoplasm of unspecified lung: Secondary | ICD-10-CM | POA: Diagnosis not present

## 2015-06-21 LAB — CBC WITH DIFFERENTIAL/PLATELET
BASO%: 1 % (ref 0.0–2.0)
Basophils Absolute: 0.1 10*3/uL (ref 0.0–0.1)
EOS%: 2.8 % (ref 0.0–7.0)
Eosinophils Absolute: 0.2 10*3/uL (ref 0.0–0.5)
HEMATOCRIT: 44 % (ref 38.4–49.9)
HEMOGLOBIN: 15.1 g/dL (ref 13.0–17.1)
LYMPH#: 2.3 10*3/uL (ref 0.9–3.3)
LYMPH%: 33.5 % (ref 14.0–49.0)
MCH: 33.9 pg — ABNORMAL HIGH (ref 27.2–33.4)
MCHC: 34.3 g/dL (ref 32.0–36.0)
MCV: 98.9 fL — ABNORMAL HIGH (ref 79.3–98.0)
MONO#: 1.1 10*3/uL — ABNORMAL HIGH (ref 0.1–0.9)
MONO%: 16 % — ABNORMAL HIGH (ref 0.0–14.0)
NEUT%: 46.7 % (ref 39.0–75.0)
NEUTROS ABS: 3.2 10*3/uL (ref 1.5–6.5)
Platelets: 214 10*3/uL (ref 140–400)
RBC: 4.45 10*6/uL (ref 4.20–5.82)
RDW: 12.9 % (ref 11.0–14.6)
WBC: 6.9 10*3/uL (ref 4.0–10.3)

## 2015-06-21 LAB — COMPREHENSIVE METABOLIC PANEL (CC13)
ALBUMIN: 4 g/dL (ref 3.5–5.0)
ALK PHOS: 101 U/L (ref 40–150)
ALT: 46 U/L (ref 0–55)
ANION GAP: 8 meq/L (ref 3–11)
AST: 36 U/L — ABNORMAL HIGH (ref 5–34)
BILIRUBIN TOTAL: 1.04 mg/dL (ref 0.20–1.20)
BUN: 9.8 mg/dL (ref 7.0–26.0)
CALCIUM: 9.3 mg/dL (ref 8.4–10.4)
CO2: 27 mEq/L (ref 22–29)
Chloride: 107 mEq/L (ref 98–109)
Creatinine: 1.2 mg/dL (ref 0.7–1.3)
EGFR: 63 mL/min/{1.73_m2} — AB (ref >90)
Glucose: 78 mg/dl (ref 70–140)
POTASSIUM: 4 meq/L (ref 3.5–5.1)
Sodium: 142 mEq/L (ref 136–145)
TOTAL PROTEIN: 6.6 g/dL (ref 6.4–8.3)

## 2015-06-21 LAB — LACTATE DEHYDROGENASE (CC13): LDH: 162 U/L (ref 125–245)

## 2015-06-26 ENCOUNTER — Other Ambulatory Visit: Payer: Self-pay | Admitting: Hematology

## 2015-06-26 ENCOUNTER — Telehealth: Payer: Self-pay | Admitting: Pharmacist

## 2015-06-26 DIAGNOSIS — C7900 Secondary malignant neoplasm of unspecified kidney and renal pelvis: Secondary | ICD-10-CM

## 2015-06-26 MED ORDER — SUNITINIB MALATE 50 MG PO CAPS: ORAL_CAPSULE | ORAL | Status: DC

## 2015-06-26 NOTE — Telephone Encounter (Signed)
11/22: Faxed Rx for Sutent to [facility]

## 2015-06-27 NOTE — Telephone Encounter (Signed)
11/23: Sutent Rx required Prior Auth. Faxed to [facility] #: [phone]

## 2015-07-05 ENCOUNTER — Telehealth: Payer: Self-pay | Admitting: Hematology

## 2015-07-05 ENCOUNTER — Other Ambulatory Visit: Payer: Self-pay | Admitting: *Deleted

## 2015-07-05 DIAGNOSIS — C7900 Secondary malignant neoplasm of unspecified kidney and renal pelvis: Secondary | ICD-10-CM

## 2015-07-05 NOTE — Telephone Encounter (Signed)
pt cld in to see if any other appts set  up-adv per plast pof no ther appts were to be sch-pt had questions about meds-trans [phone] [phone]

## 2015-07-05 NOTE — Telephone Encounter (Signed)
lvm for pt regarding to DEC appt.Marland KitchenMarland KitchenMarland KitchenMarland Kitchenpt ok and aware

## 2015-07-06 ENCOUNTER — Ambulatory Visit (HOSPITAL_BASED_OUTPATIENT_CLINIC_OR_DEPARTMENT_OTHER): Payer: Medicare Other

## 2015-07-06 ENCOUNTER — Other Ambulatory Visit: Payer: Medicare Other

## 2015-07-06 DIAGNOSIS — C7900 Secondary malignant neoplasm of unspecified kidney and renal pelvis: Secondary | ICD-10-CM

## 2015-07-06 LAB — CBC WITH DIFFERENTIAL/PLATELET
BASO%: 1.2 % (ref 0.0–2.0)
Basophils Absolute: 0.1 10*3/uL (ref 0.0–0.1)
EOS%: 2.6 % (ref 0.0–7.0)
Eosinophils Absolute: 0.2 10*3/uL (ref 0.0–0.5)
HCT: 46.3 % (ref 38.4–49.9)
HGB: 15.6 g/dL (ref 13.0–17.1)
LYMPH#: 3.1 10*3/uL (ref 0.9–3.3)
LYMPH%: 41.3 % (ref 14.0–49.0)
MCH: 33.9 pg — ABNORMAL HIGH (ref 27.2–33.4)
MCHC: 33.6 g/dL (ref 32.0–36.0)
MCV: 100.9 fL — ABNORMAL HIGH (ref 79.3–98.0)
MONO#: 1 10*3/uL — ABNORMAL HIGH (ref 0.1–0.9)
MONO%: 13.6 % (ref 0.0–14.0)
NEUT#: 3.1 10*3/uL (ref 1.5–6.5)
NEUT%: 41.3 % (ref 39.0–75.0)
Platelets: 184 10*3/uL (ref 140–400)
RBC: 4.59 10*6/uL (ref 4.20–5.82)
RDW: 13 % (ref 11.0–14.6)
WBC: 7.5 10*3/uL (ref 4.0–10.3)

## 2015-07-06 LAB — COMPREHENSIVE METABOLIC PANEL
ALT: 52 U/L (ref 0–55)
AST: 46 U/L — AB (ref 5–34)
Albumin: 4.3 g/dL (ref 3.5–5.0)
Alkaline Phosphatase: 99 U/L (ref 40–150)
Anion Gap: 10 mEq/L (ref 3–11)
BUN: 8.6 mg/dL (ref 7.0–26.0)
CO2: 26 meq/L (ref 22–29)
CREATININE: 1.2 mg/dL (ref 0.7–1.3)
Calcium: 9.5 mg/dL (ref 8.4–10.4)
Chloride: 106 mEq/L (ref 98–109)
EGFR: 66 mL/min/{1.73_m2} — ABNORMAL LOW (ref >90)
Glucose: 72 mg/dl (ref 70–140)
POTASSIUM: 3.6 meq/L (ref 3.5–5.1)
SODIUM: 142 meq/L (ref 136–145)
Total Bilirubin: 1.38 mg/dL — ABNORMAL HIGH (ref 0.20–1.20)
Total Protein: 7.2 g/dL (ref 6.4–8.3)

## 2015-07-06 NOTE — Telephone Encounter (Signed)
12/2 - Sutent prior auth approval from [facility]. Copay is ~ $1700. Pt cannot afford. He has filled out and signed patient assistance forms for [employer] assistance. I have faxed forms and financial documents to [facility] at # [phone]. Mr. Dimuzio picked up Sutent sample supply from [place] on 12/1 and will start therapy on Saturday 12/3.   Thank you,  Montel Clock, PharmD, [facility]

## 2015-07-12 ENCOUNTER — Encounter: Payer: Self-pay | Admitting: Hematology

## 2015-07-12 ENCOUNTER — Telehealth: Payer: Self-pay | Admitting: Hematology

## 2015-07-12 ENCOUNTER — Ambulatory Visit (HOSPITAL_BASED_OUTPATIENT_CLINIC_OR_DEPARTMENT_OTHER): Payer: Medicare Other | Admitting: Hematology

## 2015-07-12 VITALS — BP 192/82 | HR 48 | Temp 98.0°F | Resp 18 | Ht 69.5 in | Wt 197.0 lb

## 2015-07-12 DIAGNOSIS — C641 Malignant neoplasm of right kidney, except renal pelvis: Secondary | ICD-10-CM | POA: Diagnosis not present

## 2015-07-12 DIAGNOSIS — C7800 Secondary malignant neoplasm of unspecified lung: Secondary | ICD-10-CM | POA: Diagnosis not present

## 2015-07-12 DIAGNOSIS — I158 Other secondary hypertension: Secondary | ICD-10-CM

## 2015-07-12 DIAGNOSIS — C7900 Secondary malignant neoplasm of unspecified kidney and renal pelvis: Secondary | ICD-10-CM | POA: Insufficient documentation

## 2015-07-12 DIAGNOSIS — C7901 Secondary malignant neoplasm of right kidney and renal pelvis: Secondary | ICD-10-CM

## 2015-07-12 MED ORDER — AMLODIPINE BESYLATE 10 MG PO TABS: 10.0000 mg | ORAL_TABLET | Freq: Every day | ORAL | Status: DC

## 2015-07-12 NOTE — Progress Notes (Signed)
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 2) Remote history of metastatic renal cell carcinoma Diagnosed with renal cell carcinoma 20 years ago and had a right nephrectomy. About 8 years after that he was noted to have abdominal recurrence in his pancreas spleen and small intestine and had a Whipple's procedure [May 2001] and significant abdominal surgery and notes that 10 out of 17 lymph nodes were positive. He also had his gallbladder removed. Postoperative course was complicated by an internal hemorrhage as per his report. Patient notes 2-3 years after that he had recurrence in his thyroid that led to a thyroidectomy. [June 2004] later he had partial gastrectomy for local recurrence. [February 2005] when he presented with GI bleeding. Patient notes that he has had no known evidence of recurrence over the last 8 years until his recent CT scan showed lung nodules.  Current Treatment: Planning to start patient on Sutent.  INTERVAL HISTORY:  Marqez is here for follow-up of his lung lesion biopsy. He notes no acute new symptoms. No chest pain or shortness of breath.He notes that he is currently on plaquenil for his RA and recently had a tuberculin test with his rheumatologist in preparation for the consideration of the possible use of biologics for his RA. CT with no evidence of active TB. We discussed the imaging findings from his staging workup and the pathology results. Patient mentions on several occassions that the cost of treatment is very important to him since he does not want to leave his wife in debt and since he previously had to deals with large amount of medical cost that put him into debt. No fevers/chills/night sweats.   REVIEW OF SYSTEMS:    10 Point review of systems of  done and is negative except as noted above.  . Past Medical History  Diagnosis Date  . Arthritis   . Kidney disease   . Diabetes ([place])   . Depression   . Hypertension   . Hypothyroidism   . Cancer [facility])     Renal cell  . Rheumatoid arthritis ([place])   . Nephrolithiasis     . Past Surgical History  Procedure Laterality Date  . Thyroidectomy    . Gastrectomy      tumor removed  . Whipple procedure    . Nephrectomy Right     . Social History  Substance Use Topics  . Smoking status: Never Smoker   . Smokeless tobacco: Never Used  . Alcohol Use: Yes     Comment: occasional beer    ALLERGIES:  has No Known Allergies.  MEDICATIONS:  Current Outpatient Prescriptions  Medication Sig Dispense Refill  . amLODipine (NORVASC) 5 MG tablet     . Ascorbic Acid (VITAMIN C) 1000 MG tablet Take 1,000 mg by mouth daily.    Marland Kitchen aspirin 81 MG tablet Take 81 mg by mouth daily.    Marland Kitchen atenolol (TENORMIN) 100 MG tablet     . augmented betamethasone dipropionate (DIPROLENE-AF) 0.05 % cream   0  . calcium citrate-vitamin D (CITRACAL+D) 315-200 MG-UNIT per tablet Take 2 tablets by mouth daily.     . Cyanocobalamin (VITAMIN B-12) 2500 MCG SUBL Take by mouth daily.     . famotidine (PEPCID) 20 MG tablet Take 20 mg by mouth 2 (two) times daily.    Marland Kitchen  ferrous sulfate 325 (65 FE) MG tablet Take 325 mg by mouth 2 (two) times daily with a meal.     . Glucosamine-MSM-Hyaluronic Acd (JOINT HEALTH) 750-375-30 MG TABS Take 2 tablets by mouth daily.    Marland Kitchen HYDROcodone-acetaminophen (NORCO/VICODIN) 5-325 MG tablet Take 1-2 tablets by mouth every 4 (four) hours as needed for moderate pain.    . hydroxychloroquine (PLAQUENIL) 200 MG tablet   0  . insulin lispro (HUMALOG) 100 UNIT/ML injection Inject into the skin 3 (three) times daily before meals.    . Iron-Vitamins (GERITOL COMPLETE) TABS Take 1 tablet by mouth daily.    . Lactobacillus (ACIDOPHILUS) CAPS capsule Take 1 capsule by mouth daily.    Marland Kitchen LEVEMIR 100  UNIT/ML injection Inject 7 Units into the skin 2 (two) times daily.   1  . levothyroxine (SYNTHROID, LEVOTHROID) 300 MCG tablet     . lovastatin (MEVACOR) 40 MG tablet     . minoxidil (LONITEN) 10 MG tablet     . Multiple Vitamin (MULTIVITAMIN) tablet Take 1 tablet by mouth daily.    . Omega-3 Fatty Acids (FISH OIL) 1200 MG CAPS Take 1 capsule by mouth daily.    . Pancrelipase, Lip-Prot-Amyl, (CREON) 6000 UNITS CPEP Take 4 capsules by mouth 3 (three) times daily.    . potassium citrate (UROCIT-K) 10 MEQ (1080 MG) SR tablet     . traMADol (ULTRAM) 50 MG tablet Take by mouth as needed.    . valsartan-hydrochlorothiazide (DIOVAN-HCT) 160-12.5 MG per tablet     . SUNItinib (SUTENT) 50 MG capsule 50mg  po daily for 4 wks followed by 2 wk rest. 28 capsule 2   No current facility-administered medications for this visit.    PHYSICAL EXAMINATION: ECOG PERFORMANCE STATUS: 1 - Symptomatic but completely ambulatory  . Filed Vitals:   06/21/15 0850  BP: 152/74  Pulse: 60  Temp: 98.2 F (36.8 C)  Resp: 18    Filed Weights   06/21/15 0850  Weight: 200 lb 6.4 oz (90.901 kg)   .Body mass index is 29.18 kg/(m^2).  GENERAL:alert, in no acute distress and comfortable SKIN: skin color, texture, turgor are normal, no rashes or significant lesions EYES: normal, conjunctiva are pink and non-injected, sclera clear OROPHARYNX:no exudate, no erythema and lips, buccal mucosa, and tongue normal  NECK: supple, no JVD, thyroid normal size, non-tender, without nodularity LYMPH:  no palpable lymphadenopathy in the cervical, axillary or inguinal LUNGS: clear to auscultation with normal respiratory effort HEART: regular rate & rhythm,  no murmurs and no lower extremity edema ABDOMEN: abdomen soft, non-tender, normoactive bowel sounds  Musculoskeletal: no cyanosis of digits and no clubbing  PSYCH: alert & oriented x 3 with fluent speech NEURO: no focal motor/sensory deficits  LABORATORY DATA:   I have  reviewed the data as listed  . CBC Latest Ref Rng 07/06/2015 06/21/2015 06/13/2015  WBC 4.0 - 10.3 10e3/uL 7.5 6.9 7.3  Hemoglobin 13.0 - 17.1 g/dL 15.6 15.1 14.8  Hematocrit 38.4 - 49.9 % 46.3 44.0 43.1  Platelets 140 - 400 10e3/uL 184 214 214    . CMP Latest Ref Rng 07/06/2015 06/21/2015 06/13/2015  Glucose 70 - 140 mg/dl 72 78 174(H)  BUN 7.0 - 26.0 mg/dL 8.6 9.8 13  Creatinine 0.7 - 1.3 mg/dL 1.2 1.2 1.09  Sodium 136 - 145 mEq/L 142 142 140  Potassium 3.5 - 5.1 mEq/L 3.6 4.0 3.7  Chloride 101 - 111 mmol/L - - 109  CO2 22 - 29 mEq/L 26 27 23  Calcium 8.4 - 10.4 mg/dL 9.5 9.3 9.3  Total Protein 6.4 - 8.3 g/dL 7.2 6.6 -  Total Bilirubin 0.20 - 1.20 mg/dL 1.38(H) 1.04 -  Alkaline Phos 40 - 150 U/L 99 101 -  AST 5 - 34 U/L 46(H) 36(H) -  ALT 0 - 55 U/L 52 46 -     RADIOGRAPHIC STUDIES: I have personally reviewed the radiological images as listed and agreed with the findings in the report. Dg Chest 1 View  06/13/2015  CLINICAL DATA:  Status post right lung biopsy EXAM: CHEST 1 VIEW COMPARISON:  Chest CT June 13, 2015 and chest radiograph October 26, 2010 FINDINGS: No pneumothorax. Right midlung mass is again noted, currently measuring 1.9 x 2.0 cm. Lungs elsewhere are clear. Heart size and pulmonary vascularity are normal. No adenopathy. There postoperative changes in the cervicothoracic junction region as well as in the left upper quadrant of the abdomen. No bone lesions apparent. IMPRESSION: Stable nodular lesion right midlung. No pneumothorax. No new opacity. Electronically Signed   By: Lowella Grip III M.D.   On: 06/13/2015 14:36   Ct Biopsy  06/13/2015  CLINICAL DATA:  64 year old male with a history of lung nodules. Right middle lobe nodule is FDG avid. EXAM: CT-GUIDED BIOPSY RIGHT MIDDLE LOBE NODULE MEDICATIONS AND MEDICAL HISTORY: Versed 0.5 mg, Fentanyl 25 mcg. Additional Medications: None. ANESTHESIA/SEDATION: Moderate sedation time: 14 minutes PROCEDURE: The procedure,  risks, benefits, and alternatives were explained to the patient and the patient's family. Specific risks that were addressed included bleeding, infection, pneumothorax, need for further procedure including chest tube placement, chance of delayed pneumothorax or hemorrhage, hemoptysis, nondiagnostic sample, cardiopulmonary collapse, death. Questions regarding the procedure were encouraged and answered. The patient understands and consents to the procedure. Patient was positioned in the supine position on the CT gantry table and a scout CT of the chest was performed for planning purposes. Once angle of approach was determined, the skin and subcutaneous tissues this scan was prepped and draped in the usual sterile fashion, and a sterile drape was applied covering the operative field. A sterile gown and sterile gloves were used for the procedure. Local anesthesia was provided with 1% Lidocaine. The skin and subcutaneous tissues were infiltrated 1% lidocaine for local anesthesia, and a small stab incision was made with an 11 blade scalpel. Using CT guidance, a 17 gauge trocar needle was advanced into the right middle lobetarget. After confirmation of the tip, separate 18 gauge core biopsies were performed. These were placed into solution for transportation to the lab. A Biosentry device was deployed.  A final CT image was performed. Patient tolerated the procedure well and remained hemodynamically stable throughout. No complications were encountered and no significant blood loss was encounter FINDINGS: The images document guide needle placement within the right middle lobe nodule. Post biopsy images demonstrate no evidence of pneumothorax. COMPLICATIONS: None IMPRESSION: Status post CT-guided right middle lobe nodule. Tissue specimen sent to pathology for complete histopathologic analysis. Status post BioSentry device deployment. Signed, Dulcy Fanny. Earleen Newport, DO [facility]  Radiology Electronically Signed   By: Corrie Mckusick D.O.   On: 06/13/2015 13:07   Ct Head Wo Contrast  06/06/2015 CLINICAL DATA: Renal cell carcinoma question metastatic disease, diabetes mellitus, hypertension EXAM: CT HEAD WITHOUT CONTRAST TECHNIQUE: Contiguous axial images were obtained from the base of the skull through the vertex without intravenous contrast. COMPARISON: 02/12/2004 FINDINGS: Mild generalized atrophy. Normal ventricular morphology. No midline shift or mass effect. Otherwise normal  appearance of brain parenchyma. No intracranial hemorrhage, mass lesion, or evidence acute infarction. No extra-axial fluid collections. Visualized paranasal sinuses and mastoid air cells clear. Bones unremarkable. IMPRESSION: Normal noncontrast CT head. Electronically Signed By: Lavonia Dana M.D. On: 06/06/2015 08:56   Nm Pet Image Initial (pi) Skull Base To Thigh  06/06/2015 CLINICAL DATA: Initial treatment strategy for evaluate lung nodule. History of renal cell carcinoma. EXAM: NUCLEAR MEDICINE PET SKULL BASE TO THIGH TECHNIQUE: 9.5 mCi F-18 FDG was injected intravenously. Full-ring PET imaging was performed from the skull base to thigh after the radiotracer. CT data was obtained and used for attenuation correction and anatomic localization. FASTING BLOOD GLUCOSE: Value: 254 mg/dl COMPARISON: Chest CT of 05/09/2015. PET of 02/18/2008. FINDINGS: Mild degradation secondary to elevated blood glucose and increased soft tissue uptake. NECK No areas of abnormal hypermetabolism. CHEST The dominant right middle lobe pulmonary nodule is hypermetabolic. Measures 2.0 cm and a S.U.V. max of 6.1 (image 86, series 4). Index left lower lobe pulmonary nodule measures 11 mm and a S.U.V. max of 2.0 (image 112, series 4). No thoracic nodal hypermetabolism. ABDOMEN/PELVIS No intra-abdominal or intrapelvic hypermetabolism. Anterior abdominal wall subcutaneous hypermetabolism is likely related to injection sites.  SKELETON No abnormal marrow activity. Note is made of degenerative arthropathy at both acromioclavicular joints. CT IMAGES PERFORMED FOR ATTENUATION CORRECTION Left carotid atherosclerosis. No cervical adenopathy. Thyroidectomy. Chest findings deferred to recent diagnostic CT. Mild cardiomegaly. LAD coronary artery atherosclerosis. Other smaller pulmonary nodules which are below the resolution of PET. Lower pole left renal collecting system calculus. Right nephrectomy. Pneumobilia. Atrophy versus surgical absence of portions of the pancreas. Suboptimally evaluated. Apparent bladder wall thickening, possibly due to underdistention. Mild prostatomegaly. IMPRESSION: 1. Bilateral pulmonary nodules, including a hypermetabolic dominant right middle lobe nodule. Differential considerations include primary bronchogenic carcinoma with pulmonary metastasis versus delayed metastasis from renal cell carcinoma. Consider tissue sampling of the right middle lobe dominant nodule. 2. Otherwise, no evidence of hypermetabolic metastatic disease after right nephrectomy. 3. Incidental findings, including coronary artery atherosclerosis, left nephrolithiasis, and prostatomegaly. 4. Decreased sensitivity and specificity exam due to technique related factors, as described above. Electronically Signed By: Abigail Miyamoto M.D. On: 06/06/2015 09:11    ASSESSMENT & PLAN:  64 year old Caucasian male with  #1 Remote history of metastatic renal cell carcinoma status post right-sided nephrectomy with multiple recurrences treated with surgical excision as detailed above. He has been NED status for the last 8 years. He stopped following up with the cancer Center due to concerns about medical costs. He has been referred back to [place] by Dr. Sharlett Iles after recent CT of the chest showed multiple lung nodules concerning for neoplastic etiology.  #2 New recurrence of metastatic Renal cell carcinoma with multiple lung metastases. PET/CT positive  pulmonary nodule. 2 cm right middle lobe lung nodule appears to be the most prominent and FDG avid. His blood sugars prior to the PET/CT scan were elevated which might reduce the sensitivity of the PET scan.  No overt extrathoracic metastatic disease noted.  CT head unremarkable for evidence of metastatic disease.  Lung nodule biopsy confirms -poorly differentiated carcinoma with focal clear cell and focal sarcomatoid features. #3 recent positive tuberculin test done my rheumatology in contemplating biologics for his RA. Plan -patient has multiple lung lesions bilateral that preclude potentially curative resection. -will need systemic therapies to address his metastatic RCC (mixed histology) -either TKI/PD-1 inhibitor would be first line treatment options given his mixed histology RCC. -PD-1 is likely (about 30% likelihood) to cause  significant flare of his RA. -Sutent is effective predominantly through its VEGF pathway targeting should not cause significantly elevated risk of TB reactivation though a small concern remains. -after discussing the pros and cons of these treatment options we decided to proceed with Sutent -chemo-counselling referal given -prescription sent to out oral chemo/immunotherapy pharmacist for approval/consting considerations. -we will rpt labs including TB quantiferon assay on next clinic visit. -if patients overall disease is stable and his measurable disease shows response - might consider ad onc evaluation to consider SBRT to dominant lesions.   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 #4 hypothyroidism related to his thyroidectomy #5 single kidney with chronic kidney disease. #6 nephrolithiasis. 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 #8 rheumatoid arthritis on Plaquenil Plan  -continue follow-up with primary care physician regarding these other medical problems -continue close followup with  rheumatology  Return to care with Dr. Beverley Fiedler 07/12/2015  With cbc, cmp, TB quantiferon assay and LDH  I spent 25 minutes counseling the patient face to face. The total time spent in the appointment was 30 minutes and more than 50% was on counseling and direct patient cares.    Sullivan Lone MD [place] AAHIVMS [facility] [place] Hematology/Oncology Physician [facility]  (Office):       [phone] (Work cell):  [phone] (Fax):           [phone]

## 2015-07-12 NOTE — Telephone Encounter (Signed)
Gave and printed appt sched and avs fo rpt for Jan °

## 2015-07-13 LAB — QUANTIFERON TB GOLD ASSAY (BLOOD)
Interferon Gamma Release Assay: POSITIVE — AB
Mitogen value: >10 IU/mL
Quantiferon Nil Value: 0.04 IU/mL
Quantiferon Tb Ag Minus Nil Value: 1.18 IU/mL
TB AG VALUE: 1.22 [IU]/mL

## 2015-07-16 ENCOUNTER — Encounter: Payer: Self-pay | Admitting: Pharmacist

## 2015-07-16 DIAGNOSIS — Z5111 Encounter for antineoplastic chemotherapy: Secondary | ICD-10-CM | POA: Insufficient documentation

## 2015-07-16 NOTE — Progress Notes (Signed)
Oral Chemotherapy Pharmacist Encounter   I spoke with patient for overview of new oral chemotherapy medication: Sutent. Pt is doing well. He started Sutent on Friday 07/06/15 with a free trial 2 week trial. He has now been enrolled and approved for the [place] patient assistance program and will receive his first shipment this week (likely on 12/13 or 12/14). Mr. Bingen is grateful for the assistance.   Counseled patient on 07/05/15 on administration, dosing, side effects, safe handling, and monitoring. Side effects include but not limited to: Myelosuppression, nausea, vomiting, faitgue, peripheral edema, and hypertension.  Mr. Sybrant voiced understanding and appreciation. He understands to take for 4 weeks on and then 2 weeks off (repeating every 6 weeks).   All questions answered.   Will follow up in 2 weeks for adherence and toxicity management.   Thank you,  Montel Clock, PharmD, [facility]

## 2015-08-07 NOTE — Progress Notes (Signed)
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 2) Remote history of metastatic renal cell carcinoma Diagnosed with renal cell carcinoma 20 years ago and had a right nephrectomy. About 8 years after that he was noted to have abdominal recurrence in his pancreas spleen and small intestine and had a Whipple's procedure [May 2001] and significant abdominal surgery and notes that 10 out of 17 lymph nodes were positive. He also had his gallbladder removed. Postoperative course was complicated by an internal hemorrhage as per his report. Patient notes 2-3 years after that he had recurrence in his thyroid that led to a thyroidectomy. [June 2004] later he had partial gastrectomy for local recurrence. [February 2005] when he presented with GI bleeding. Patient notes that he has had no known evidence of recurrence over the last 8 years until his recent CT scan showed lung nodules.  Current Treatment: Planning to start patient on Sutent.  INTERVAL HISTORY:  Dylan Jefferson is here for follow-up for toxicity check on the Sutent that he started about a week ago. He underwent chemotherapy counseling and after getting informed consent was started on Sutent. He notes that he has had no nausea or vomiting rashes or other significant adverse effects. No other acute new symptoms.  REVIEW OF SYSTEMS:    10 Point review of systems of done and is negative except as noted above.  . Past Medical History  Diagnosis Date  . Arthritis   . Kidney disease   . Diabetes ([place])   . Depression   . Hypertension   . Hypothyroidism   . Cancer [facility])     Renal cell  . Rheumatoid arthritis ([place])   . Nephrolithiasis     . Past Surgical History  Procedure Laterality Date  . Thyroidectomy    . Gastrectomy      tumor  removed  . Whipple procedure    . Nephrectomy Right     . Social History  Substance Use Topics  . Smoking status: Never Smoker   . Smokeless tobacco: Never Used  . Alcohol Use: Yes     Comment: occasional beer    ALLERGIES:  has No Known Allergies.  MEDICATIONS:  Current Outpatient Prescriptions  Medication Sig Dispense Refill  . amLODipine (NORVASC) 10 MG tablet Take 1 tablet (10 mg total) by mouth daily. 30 tablet 6  . Ascorbic Acid (VITAMIN C) 1000 MG tablet Take 1,000 mg by mouth daily.    Dylan Jefferson aspirin 81 MG tablet Take 81 mg by mouth daily.    Dylan Jefferson atenolol (TENORMIN) 100 MG tablet     . augmented betamethasone dipropionate (DIPROLENE-AF) 0.05 % cream   0  . calcium citrate-vitamin D (CITRACAL+D) 315-200 MG-UNIT per tablet Take 2 tablets by mouth daily.     . Cyanocobalamin (VITAMIN B-12) 2500 MCG SUBL Take by mouth daily.     . famotidine (PEPCID) 20 MG tablet Take 20 mg by mouth 2 (two) times daily.    . ferrous sulfate 325 (65 FE) MG tablet Take 325 mg by mouth 2 (two) times daily with a meal.     . Glucosamine-MSM-Hyaluronic Acd (JOINT HEALTH) 750-375-30 MG TABS Take 2 tablets by mouth daily.    Dylan Jefferson HYDROcodone-acetaminophen (NORCO/VICODIN) 5-325 MG tablet Take 1-2 tablets by mouth every 4 (four) hours as needed for moderate pain.    Dylan Jefferson  hydroxychloroquine (PLAQUENIL) 200 MG tablet   0  . insulin lispro (HUMALOG) 100 UNIT/ML injection Inject into the skin 3 (three) times daily before meals.    . Iron-Vitamins (GERITOL COMPLETE) TABS Take 1 tablet by mouth daily.    . Lactobacillus (ACIDOPHILUS) CAPS capsule Take 1 capsule by mouth daily.    Dylan Jefferson LEVEMIR 100 UNIT/ML injection Inject 7 Units into the skin 2 (two) times daily.   1  . levothyroxine (SYNTHROID, LEVOTHROID) 300 MCG tablet     . lovastatin (MEVACOR) 40 MG tablet     . minoxidil (LONITEN) 10 MG tablet     . Multiple Vitamin (MULTIVITAMIN) tablet Take 1 tablet by mouth daily.    . Omega-3 Fatty Acids (FISH OIL) 1200 MG CAPS  Take 1 capsule by mouth daily.    . Pancrelipase, Lip-Prot-Amyl, (CREON) 6000 UNITS CPEP Take 4 capsules by mouth 3 (three) times daily.    . potassium citrate (UROCIT-K) 10 MEQ (1080 MG) SR tablet     . SUNItinib (SUTENT) 50 MG capsule 50mg  po daily for 4 wks followed by 2 wk rest. 28 capsule 2  . traMADol (ULTRAM) 50 MG tablet Take by mouth as needed.    . valsartan-hydrochlorothiazide (DIOVAN-HCT) 160-12.5 MG per tablet      No current facility-administered medications for this visit.    PHYSICAL EXAMINATION: ECOG PERFORMANCE STATUS: 1 - Symptomatic but completely ambulatory  . Filed Vitals:   07/12/15 0851 07/12/15 0852  BP: 184/84 192/82  Pulse: 48   Temp: 98 F (36.7 C)   Resp: 18     Filed Weights   07/12/15 0851  Weight: 197 lb (89.359 kg)   .Body mass index is 28.68 kg/(m^2).  GENERAL:alert, in no acute distress and comfortable SKIN: skin color, texture, turgor are normal, no rashes or significant lesions EYES: normal, conjunctiva are pink and non-injected, sclera clear OROPHARYNX:no exudate, no erythema and lips, buccal mucosa, and tongue normal  NECK: supple, no JVD, thyroid normal size, non-tender, without nodularity LYMPH:  no palpable lymphadenopathy in the cervical, axillary or inguinal LUNGS: clear to auscultation with normal respiratory effort HEART: regular rate & rhythm,  no murmurs and no lower extremity edema ABDOMEN: abdomen soft, non-tender, normoactive bowel sounds  Musculoskeletal: no cyanosis of digits and no clubbing  PSYCH: alert & oriented x 3 with fluent speech NEURO: no focal motor/sensory deficits  LABORATORY DATA:   I have reviewed the data as listed  . CBC Latest Ref Rng 07/06/2015 06/21/2015 06/13/2015  WBC 4.0 - 10.3 10e3/uL 7.5 6.9 7.3  Hemoglobin 13.0 - 17.1 g/dL 15.6 15.1 14.8  Hematocrit 38.4 - 49.9 % 46.3 44.0 43.1  Platelets 140 - 400 10e3/uL 184 214 214    . CMP Latest Ref Rng 07/06/2015 06/21/2015 06/13/2015  Glucose 70 -  140 mg/dl 72 78 174(H)  BUN 7.0 - 26.0 mg/dL 8.6 9.8 13  Creatinine 0.7 - 1.3 mg/dL 1.2 1.2 1.09  Sodium 136 - 145 mEq/L 142 142 140  Potassium 3.5 - 5.1 mEq/L 3.6 4.0 3.7  Chloride 101 - 111 mmol/L - - 109  CO2 22 - 29 mEq/L 26 27 23   Calcium 8.4 - 10.4 mg/dL 9.5 9.3 9.3  Total Protein 6.4 - 8.3 g/dL 7.2 6.6 -  Total Bilirubin 0.20 - 1.20 mg/dL 1.38(H) 1.04 -  Alkaline Phos 40 - 150 U/L 99 101 -  AST 5 - 34 U/L 46(H) 36(H) -  ALT 0 - 55 U/L 52 46 -   RADIOGRAPHIC STUDIES:  I have personally reviewed the radiological images as listed and agreed with the findings in the report. No results found. Ct Head Wo Contrast  06/06/2015 CLINICAL DATA: Renal cell carcinoma question metastatic disease, diabetes mellitus, hypertension EXAM: CT HEAD WITHOUT CONTRAST TECHNIQUE: Contiguous axial images were obtained from the base of the skull through the vertex without intravenous contrast. COMPARISON: 02/12/2004 FINDINGS: Mild generalized atrophy. Normal ventricular morphology. No midline shift or mass effect. Otherwise normal appearance of brain parenchyma. No intracranial hemorrhage, mass lesion, or evidence acute infarction. No extra-axial fluid collections. Visualized paranasal sinuses and mastoid air cells clear. Bones unremarkable. IMPRESSION: Normal noncontrast CT head. Electronically Signed By: Lavonia Dana M.D. On: 06/06/2015 08:56   Nm Pet Image Initial (pi) Skull Base To Thigh  06/06/2015 CLINICAL DATA: Initial treatment strategy for evaluate lung nodule. History of renal cell carcinoma. EXAM: NUCLEAR MEDICINE PET SKULL BASE TO THIGH TECHNIQUE: 9.5 mCi F-18 FDG was injected intravenously. Full-ring PET imaging was performed from the skull base to thigh after the radiotracer. CT data was obtained and used for attenuation correction and anatomic localization. FASTING BLOOD GLUCOSE: Value: 254 mg/dl COMPARISON: Chest CT of 05/09/2015. PET of 02/18/2008. FINDINGS: Mild degradation secondary to  elevated blood glucose and increased soft tissue uptake. NECK No areas of abnormal hypermetabolism. CHEST The dominant right middle lobe pulmonary nodule is hypermetabolic. Measures 2.0 cm and a S.U.V. max of 6.1 (image 86, series 4). Index left lower lobe pulmonary nodule measures 11 mm and a S.U.V. max of 2.0 (image 112, series 4). No thoracic nodal hypermetabolism. ABDOMEN/PELVIS No intra-abdominal or intrapelvic hypermetabolism. Anterior abdominal wall subcutaneous hypermetabolism is likely related to injection sites. SKELETON No abnormal marrow activity. Note is made of degenerative arthropathy at both acromioclavicular joints. CT IMAGES PERFORMED FOR ATTENUATION CORRECTION Left carotid atherosclerosis. No cervical adenopathy. Thyroidectomy. Chest findings deferred to recent diagnostic CT. Mild cardiomegaly. LAD coronary artery atherosclerosis. Other smaller pulmonary nodules which are below the resolution of PET. Lower pole left renal collecting system calculus. Right nephrectomy. Pneumobilia. Atrophy versus surgical absence of portions of the pancreas. Suboptimally evaluated. Apparent bladder wall thickening, possibly due to underdistention. Mild prostatomegaly. IMPRESSION: 1. Bilateral pulmonary nodules, including a hypermetabolic dominant right middle lobe nodule. Differential considerations include primary bronchogenic carcinoma with pulmonary metastasis versus delayed metastasis from renal cell carcinoma. Consider tissue sampling of the right middle lobe dominant nodule. 2. Otherwise, no evidence of hypermetabolic metastatic disease after right nephrectomy. 3. Incidental findings, including coronary artery atherosclerosis, left nephrolithiasis, and prostatomegaly. 4. Decreased sensitivity and specificity exam due to technique related factors, as described above. Electronically Signed By: Abigail Miyamoto M.D. On: 06/06/2015 09:11    ASSESSMENT & PLAN:  65 year old Caucasian male with  #1 Remote  history of metastatic renal cell carcinoma status post right-sided nephrectomy with multiple recurrences treated with surgical excision as detailed above. He has been NED status for the last 8 years. He stopped following up with the cancer Center due to concerns about medical costs. He has been referred back to [place] by Dr. Sharlett Iles after recent CT of the chest showed multiple lung nodules concerning for neoplastic etiology.  #2 New recurrence of metastatic Renal cell carcinoma with multiple lung metastases. PET/CT positive pulmonary nodule. 2 cm right middle lobe lung nodule appears to be the most prominent and FDG avid. His blood sugars prior to the PET/CT scan were elevated which might reduce the sensitivity of the PET scan.  No overt extrathoracic metastatic disease noted.  CT head unremarkable for evidence of  metastatic disease. Lung nodule biopsy confirms -poorly differentiated carcinoma with focal clear cell and focal sarcomatoid features. 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 Plan -the patient does not report any overt adverse effects from Sutent and is frustrated of treatment and appears to be tolerating it well. -We will plan to continue Sutent 50mg  po daily for 4 weeks on 2 weeks off. -a fatigue or other adverse effects become a challenge might switch to a 2 weeks on one week off regimen which has been found to be tolerated better with no evidence of decreased efficacy. -Sutent is effective predominantly through its VEGF pathway targeting should not cause significantly elevated risk of TB reactivation though a small concern remains. -if patients overall disease is stable and his measurable disease shows response - might consider ad onc evaluation to consider SBRT to dominant lesions. -Patient to return to care in 1 month with repeat labs with Dr. Irene Limbo for reassessment of medication tolerability. -We will plan to repeat staging imaging  after 2 cycles of treatment.  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 #4 hypothyroidism related to his thyroidectomy #5 single kidney with chronic kidney disease. #6 nephrolithiasis. 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 #8 rheumatoid arthritis on Plaquenil #9 latent TB infection. Given low risk of reactivation and increased risk of liver toxicity and drug interaction with Sutent we will hold off on treating the patient with INH at this time. Plan  -continue follow-up with primary care physician regarding these other medical problems -continue close followup with rheumatology.  Return to care with Dr. Irene Limbo on 08/09/2015  With cbc, cmp, and LDH  I spent 15 minutes counseling the patient face to face. The total time spent in the appointment was 20 minutes and more than 50% was on counseling and direct patient cares.    Sullivan Lone MD [place] AAHIVMS [facility] Hematology/Oncology Physician [facility]  (Office):       [phone] (Work cell):  [phone] (Fax):           [phone]

## 2015-08-09 ENCOUNTER — Telehealth: Payer: Self-pay | Admitting: Hematology

## 2015-08-09 ENCOUNTER — Encounter: Payer: Self-pay | Admitting: Hematology

## 2015-08-09 ENCOUNTER — Ambulatory Visit (HOSPITAL_BASED_OUTPATIENT_CLINIC_OR_DEPARTMENT_OTHER): Payer: Medicare Other | Admitting: Hematology

## 2015-08-09 ENCOUNTER — Ambulatory Visit: Payer: Medicare Other | Admitting: Nutrition

## 2015-08-09 ENCOUNTER — Other Ambulatory Visit (HOSPITAL_BASED_OUTPATIENT_CLINIC_OR_DEPARTMENT_OTHER): Payer: Medicare Other

## 2015-08-09 VITALS — BP 122/74 | HR 47 | Temp 98.4°F | Resp 17 | Ht 69.5 in | Wt 191.9 lb

## 2015-08-09 DIAGNOSIS — Z8553 Personal history of malignant neoplasm of renal pelvis: Secondary | ICD-10-CM

## 2015-08-09 DIAGNOSIS — N289 Disorder of kidney and ureter, unspecified: Secondary | ICD-10-CM

## 2015-08-09 DIAGNOSIS — E860 Dehydration: Secondary | ICD-10-CM

## 2015-08-09 DIAGNOSIS — C7800 Secondary malignant neoplasm of unspecified lung: Secondary | ICD-10-CM

## 2015-08-09 DIAGNOSIS — C7901 Secondary malignant neoplasm of right kidney and renal pelvis: Secondary | ICD-10-CM

## 2015-08-09 LAB — CBC & DIFF AND RETIC
BASO%: 0.3 % (ref 0.0–2.0)
Basophils Absolute: 0 10*3/uL (ref 0.0–0.1)
EOS%: 3.6 % (ref 0.0–7.0)
Eosinophils Absolute: 0.1 10*3/uL (ref 0.0–0.5)
HCT: 41.4 % (ref 38.4–49.9)
HGB: 14.5 g/dL (ref 13.0–17.1)
Immature Retic Fract: 2.1 % — ABNORMAL LOW (ref 3.00–10.60)
LYMPH#: 2.1 10*3/uL (ref 0.9–3.3)
LYMPH%: 57.7 % — ABNORMAL HIGH (ref 14.0–49.0)
MCH: 34 pg — ABNORMAL HIGH (ref 27.2–33.4)
MCHC: 35 g/dL (ref 32.0–36.0)
MCV: 97 fL (ref 79.3–98.0)
MONO#: 0.4 10*3/uL (ref 0.1–0.9)
MONO%: 11.5 % (ref 0.0–14.0)
NEUT%: 26.9 % — AB (ref 39.0–75.0)
NEUTROS ABS: 1 10*3/uL — AB (ref 1.5–6.5)
PLATELETS: 108 10*3/uL — AB (ref 140–400)
RBC: 4.27 10*6/uL (ref 4.20–5.82)
RDW: 14.4 % (ref 11.0–14.6)
RETIC CT ABS: 62.77 10*3/uL (ref 34.80–93.90)
Retic %: 1.47 % (ref 0.80–1.80)
WBC: 3.6 10*3/uL — AB (ref 4.0–10.3)

## 2015-08-09 LAB — COMPREHENSIVE METABOLIC PANEL
ALT: 32 U/L (ref 0–55)
AST: 29 U/L (ref 5–34)
Albumin: 3.6 g/dL (ref 3.5–5.0)
Alkaline Phosphatase: 102 U/L (ref 40–150)
Anion Gap: 9 mEq/L (ref 3–11)
BUN: 12.2 mg/dL (ref 7.0–26.0)
CO2: 25 meq/L (ref 22–29)
Calcium: 8.6 mg/dL (ref 8.4–10.4)
Chloride: 104 mEq/L (ref 98–109)
Creatinine: 1.7 mg/dL — ABNORMAL HIGH (ref 0.7–1.3)
EGFR: 41 mL/min/{1.73_m2} — AB (ref >90)
GLUCOSE: 287 mg/dL — AB (ref 70–140)
POTASSIUM: 3.7 meq/L (ref 3.5–5.1)
Sodium: 138 mEq/L (ref 136–145)
Total Bilirubin: 1.82 mg/dL — ABNORMAL HIGH (ref 0.20–1.20)
Total Protein: 6 g/dL — ABNORMAL LOW (ref 6.4–8.3)

## 2015-08-09 LAB — UA PROTEIN, DIPSTICK - CHCC: Protein, ur: <30 mg/dL

## 2015-08-09 LAB — LACTATE DEHYDROGENASE: LDH: 200 U/L (ref 125–245)

## 2015-08-09 NOTE — Telephone Encounter (Signed)
Gv pt appt for 1/12 and sent back to lab today.

## 2015-08-09 NOTE — Progress Notes (Signed)
65 year old male diagnosed with metastatic renal cell cancer.  He is a patient of Dr. Irene Limbo.  Past medical history includes kidney disease, diabetes, depression, hypertension, hypothyroidism, rheumatoid arthritis, nephrolithiasis, partial gastrectomy and Whipple procedure.  Medications include vitamin C, vitamin B12, Pepcid, Humalog, acidophilus, Synthroid, multivitamin, omega-3 fatty acids, and Creon.  Labs include glucose 287, albumin 3.6, creatinine 1.7.  Height: 69.5 inches. Weight: 191.9 pounds January 5. Usual body weight: 200 pounds November 2017. BMI: 27.94.  M.D. requesting I speak with patient today in the exam room. Patient denies nausea and vomiting, and constipation or diarrhea. He also denies mouth sores Patient does report poor appetite. He has taste alterations with foods tasting bitter or sour. Patient very distressed about taste alterations and really wants to eat.  Nutrition diagnosis:  Unintended weight loss related to taste alterations and poor appetite as evidenced by 4% weight loss in approximately 2 months.  Intervention:  Educated patient to consume smaller more frequent meals with higher calories and higher protein Encouraged patient to try oral nutrition supplements and provided samples and coupons. Educated patient and wife on strategies for improving taste alterations and provided fact sheet. Questions were answered.  Teach back method used.  Contact information was provided.  Monitoring, evaluation, goals: Patient will tolerate increased calories and protein to minimize further weight loss and increase overall food intake.  Next visit: To be scheduled as needed.  Patient wife were encouraged to contact me by phone for questions.  **Disclaimer: This note was dictated with voice recognition software. Similar sounding words can inadvertently be transcribed and this note may contain transcription errors which may not have been corrected upon publication of  note.**

## 2015-08-10 LAB — PROTEIN / CREATININE RATIO, URINE
CREATININE, URINE: 271 mg/dL (ref 20–370)
Protein Creatinine Ratio: 111 mg/g creat (ref 22–128)
Total Protein, Urine: 30 mg/dL — ABNORMAL HIGH (ref 5–25)

## 2015-08-16 ENCOUNTER — Encounter: Payer: Self-pay | Admitting: Hematology

## 2015-08-16 ENCOUNTER — Other Ambulatory Visit: Payer: Self-pay | Admitting: *Deleted

## 2015-08-16 ENCOUNTER — Ambulatory Visit (HOSPITAL_BASED_OUTPATIENT_CLINIC_OR_DEPARTMENT_OTHER): Payer: Medicare Other | Admitting: Hematology

## 2015-08-16 ENCOUNTER — Telehealth: Payer: Self-pay | Admitting: Hematology

## 2015-08-16 ENCOUNTER — Other Ambulatory Visit (HOSPITAL_BASED_OUTPATIENT_CLINIC_OR_DEPARTMENT_OTHER): Payer: Medicare Other

## 2015-08-16 VITALS — BP 132/71 | HR 48 | Temp 97.5°F | Resp 18 | Ht 69.5 in | Wt 196.0 lb

## 2015-08-16 DIAGNOSIS — C7901 Secondary malignant neoplasm of right kidney and renal pelvis: Secondary | ICD-10-CM

## 2015-08-16 DIAGNOSIS — Z8553 Personal history of malignant neoplasm of renal pelvis: Secondary | ICD-10-CM | POA: Diagnosis not present

## 2015-08-16 DIAGNOSIS — N289 Disorder of kidney and ureter, unspecified: Secondary | ICD-10-CM | POA: Diagnosis not present

## 2015-08-16 DIAGNOSIS — R5383 Other fatigue: Secondary | ICD-10-CM | POA: Diagnosis not present

## 2015-08-16 DIAGNOSIS — C7800 Secondary malignant neoplasm of unspecified lung: Secondary | ICD-10-CM

## 2015-08-16 DIAGNOSIS — C649 Malignant neoplasm of unspecified kidney, except renal pelvis: Secondary | ICD-10-CM

## 2015-08-16 LAB — COMPREHENSIVE METABOLIC PANEL
ALT: 36 U/L (ref 0–55)
AST: 32 U/L (ref 5–34)
Albumin: 3.6 g/dL (ref 3.5–5.0)
Alkaline Phosphatase: 97 U/L (ref 40–150)
Anion Gap: 8 mEq/L (ref 3–11)
BUN: 11.7 mg/dL (ref 7.0–26.0)
CHLORIDE: 105 meq/L (ref 98–109)
CO2: 24 mEq/L (ref 22–29)
Calcium: 8.9 mg/dL (ref 8.4–10.4)
Creatinine: 1.4 mg/dL — ABNORMAL HIGH (ref 0.7–1.3)
EGFR: 53 mL/min/{1.73_m2} — ABNORMAL LOW (ref >90)
GLUCOSE: 217 mg/dL — AB (ref 70–140)
POTASSIUM: 4 meq/L (ref 3.5–5.1)
SODIUM: 138 meq/L (ref 136–145)
Total Bilirubin: 1.43 mg/dL — ABNORMAL HIGH (ref 0.20–1.20)
Total Protein: 6 g/dL — ABNORMAL LOW (ref 6.4–8.3)

## 2015-08-16 LAB — URINALYSIS, MICROSCOPIC - CHCC
Bilirubin (Urine): NEGATIVE
Blood: NEGATIVE
GLUCOSE UR CHCC: NEGATIVE mg/dL
Ketones: NEGATIVE mg/dL
Leukocyte Esterase: NEGATIVE
Nitrite: NEGATIVE
Protein: <30 mg/dL
RBC / HPF: NEGATIVE (ref 0–2)
SPECIFIC GRAVITY, URINE: 1.02 (ref 1.003–1.035)
UROBILINOGEN UR: 0.2 mg/dL (ref 0.2–1)
pH: 6 (ref 4.6–8.0)

## 2015-08-16 LAB — CBC & DIFF AND RETIC
BASO%: 0.5 % (ref 0.0–2.0)
Basophils Absolute: 0 10*3/uL (ref 0.0–0.1)
EOS%: 1.4 % (ref 0.0–7.0)
Eosinophils Absolute: 0.1 10*3/uL (ref 0.0–0.5)
HCT: 37.6 % — ABNORMAL LOW (ref 38.4–49.9)
HGB: 13.1 g/dL (ref 13.0–17.1)
Immature Retic Fract: 6.8 % (ref 3.00–10.60)
LYMPH%: 47.6 % (ref 14.0–49.0)
MCH: 33.9 pg — AB (ref 27.2–33.4)
MCHC: 34.8 g/dL (ref 32.0–36.0)
MCV: 97.4 fL (ref 79.3–98.0)
MONO#: 0.9 10*3/uL (ref 0.1–0.9)
MONO%: 20.8 % — AB (ref 0.0–14.0)
NEUT%: 29.7 % — ABNORMAL LOW (ref 39.0–75.0)
NEUTROS ABS: 1.2 10*3/uL — AB (ref 1.5–6.5)
Platelets: 136 10*3/uL — ABNORMAL LOW (ref 140–400)
RBC: 3.86 10*6/uL — AB (ref 4.20–5.82)
RDW: 14.7 % — AB (ref 11.0–14.6)
RETIC %: 1.72 % (ref 0.80–1.80)
Retic Ct Abs: 66.39 10*3/uL (ref 34.80–93.90)
WBC: 4.1 10*3/uL (ref 4.0–10.3)
lymph#: 2 10*3/uL (ref 0.9–3.3)

## 2015-08-16 MED ORDER — SUNITINIB MALATE 37.5 MG PO CAPS: ORAL_CAPSULE | ORAL | Status: DC

## 2015-08-16 NOTE — Telephone Encounter (Signed)
GAVE PATIENT AVS REPORT AND APPOINTMENTS FOR February. °

## 2015-08-21 NOTE — Progress Notes (Signed)
en.  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 2) Remote history of metastatic renal cell carcinoma Diagnosed with renal cell carcinoma 20 years ago and had a right nephrectomy. About 8 years after that he was noted to have abdominal recurrence in his pancreas spleen and small intestine and had a Whipple's procedure and significant abdominal surgery and notes that 10 out of 17 lymph nodes were positive. He also had his gallbladder removed. Postoperative course was complicated by an internal hemorrhage as per his report. Patient notes 2-3 years after that he had recurrence in his thyroid that led to a thyroidectomy. [June 2004] later he had partial gastrectomy for local recurrence. [February 2005] when he presented with GI bleeding. Patient notes that he has had no known evidence of recurrence over the last 8 years until his recent CT scan showed lung nodules.  Current Treatment: Sutent 50 mg po daily for 4 weeks on and 2 weeks off.  INTERVAL HISTORY:  Dylan Jefferson is here for for his scheduled follow-up to reassess toxicity from Sutent. He notes that he is feeling much better over the last week that he has had off Sutent. He notes that he is drinking a lot of water. His fatigue, appetite and sense of well-being have significantly improved. He notes that he feels much sharper while playing his computer games. We decided that we will reduce the dose of Sutent to 37.5 mg by mouth daily 2 weeks on one week off as this might be tolerated better. Patient is agreeable to this and was given a new prescription for this. His bilirubin levels and creatinine are improved.  REVIEW OF SYSTEMS:    10 Point review of systems of done and is negative except as noted above.  . Past Medical History    Diagnosis Date  . Arthritis   . Kidney disease   . Diabetes ([place])   . Depression   . Hypertension   . Hypothyroidism   . Cancer [facility])     Renal cell  . Rheumatoid arthritis ([place])   . Nephrolithiasis     . Past Surgical History  Procedure Laterality Date  . Thyroidectomy    . Gastrectomy      tumor removed  . Whipple procedure    . Nephrectomy Right     . Social History  Substance Use Topics  . Smoking status: Never Smoker   . Smokeless tobacco: Never Used  . Alcohol Use: Yes     Comment: occasional beer    ALLERGIES:  has No Known Allergies.  MEDICATIONS:  Current Outpatient Prescriptions  Medication Sig Dispense Refill  . amLODipine (NORVASC) 10 MG tablet Take 1 tablet (10 mg total) by mouth daily. 30 tablet 6  . Ascorbic Acid (VITAMIN C) 1000 MG tablet Take 1,000 mg by mouth daily.    Marland Kitchen aspirin 81 MG tablet Take 81 mg by mouth daily.    Marland Kitchen atenolol (TENORMIN) 100 MG tablet     . augmented betamethasone dipropionate (DIPROLENE-AF) 0.05 % cream   0  . calcium citrate-vitamin D (CITRACAL+D) 315-200 MG-UNIT per tablet Take 2 tablets by mouth daily.     . Cyanocobalamin (VITAMIN B-12) 2500 MCG SUBL Take by mouth daily.     . famotidine (PEPCID) 20 MG tablet Take 20 mg by mouth 2 (two) times daily.    Marland Kitchen  ferrous sulfate 325 (65 FE) MG tablet Take 325 mg by mouth 2 (two) times daily with a meal.     . Glucosamine-MSM-Hyaluronic Acd (JOINT HEALTH) 750-375-30 MG TABS Take 2 tablets by mouth daily.    Marland Kitchen HYDROcodone-acetaminophen (NORCO/VICODIN) 5-325 MG tablet Take 1-2 tablets by mouth every 4 (four) hours as needed for moderate pain.    . hydroxychloroquine (PLAQUENIL) 200 MG tablet   0  . insulin lispro (HUMALOG) 100 UNIT/ML injection Inject into the skin 3 (three) times daily before meals.    . Iron-Vitamins (GERITOL COMPLETE) TABS Take 1 tablet by mouth daily.    . Lactobacillus (ACIDOPHILUS) CAPS capsule Take 1 capsule by mouth daily.    Marland Kitchen LEVEMIR 100 UNIT/ML  injection Inject 7 Units into the skin 2 (two) times daily.   1  . levothyroxine (SYNTHROID, LEVOTHROID) 300 MCG tablet     . lovastatin (MEVACOR) 40 MG tablet     . minoxidil (LONITEN) 10 MG tablet     . Multiple Vitamin (MULTIVITAMIN) tablet Take 1 tablet by mouth daily.    . Omega-3 Fatty Acids (FISH OIL) 1200 MG CAPS Take 1 capsule by mouth daily.    . Pancrelipase, Lip-Prot-Amyl, (CREON) 6000 UNITS CPEP Take 4 capsules by mouth 3 (three) times daily.    . potassium citrate (UROCIT-K) 10 MEQ (1080 MG) SR tablet     . SUNItinib 37.5 MG CAPS 37.5 mg po daily for 2 wks followed by 1 wk rest 28 capsule 1  . traMADol (ULTRAM) 50 MG tablet Take by mouth as needed.    . valsartan-hydrochlorothiazide (DIOVAN-HCT) 160-12.5 MG per tablet      No current facility-administered medications for this visit.    PHYSICAL EXAMINATION: ECOG PERFORMANCE STATUS: 1 - Symptomatic but completely ambulatory  . Filed Vitals:   08/16/15 0938 08/16/15 0959  BP: 132/71 132/71  Pulse: 48 48  Temp: 97.5 F (36.4 C) 97.5 F (36.4 C)  Resp: 18 18    Filed Weights   08/16/15 0938 08/16/15 0959  Weight: 196 lb (88.905 kg) 196 lb (88.905 kg)   .Body mass index is 28.54 kg/(m^2).  GENERAL:alert, in no acute distress and comfortable SKIN: skin color, texture, turgor are normal, no rashes or significant lesions EYES: normal, conjunctiva are pink and non-injected, sclera clear OROPHARYNX:no exudate, no erythema and lips, buccal mucosa, and tongue normal  NECK: supple, no JVD, thyroid normal size, non-tender, without nodularity LYMPH:  no palpable lymphadenopathy in the cervical, axillary or inguinal LUNGS: clear to auscultation with normal respiratory effort HEART: regular rate & rhythm,  no murmurs and no lower extremity edema ABDOMEN: abdomen soft, non-tender, normoactive bowel sounds  Musculoskeletal: no cyanosis of digits and no clubbing  PSYCH: alert & oriented x 3 with fluent speech NEURO: no focal  motor/sensory deficits  LABORATORY DATA:   I have reviewed the data as listed  . CBC Latest Ref Rng 08/16/2015 08/09/2015 07/06/2015  WBC 4.0 - 10.3 10e3/uL 4.1 3.6(L) 7.5  Hemoglobin 13.0 - 17.1 g/dL 13.1 14.5 15.6  Hematocrit 38.4 - 49.9 % 37.6(L) 41.4 46.3  Platelets 140 - 400 10e3/uL 136(L) 108(L) 184     . CMP Latest Ref Rng 08/16/2015 08/09/2015 07/06/2015  Glucose 70 - 140 mg/dl 217(H) 287(H) 72  BUN 7.0 - 26.0 mg/dL 11.7 12.2 8.6  Creatinine 0.7 - 1.3 mg/dL 1.4(H) 1.7(H) 1.2  Sodium 136 - 145 mEq/L 138 138 142  Potassium 3.5 - 5.1 mEq/L 4.0 3.7 3.6  Chloride 101 - 111  mmol/L - - -  CO2 22 - 29 mEq/L 24 25 26   Calcium 8.4 - 10.4 mg/dL 8.9 8.6 9.5  Total Protein 6.4 - 8.3 g/dL 6.0(L) 6.0(L) 7.2  Total Bilirubin 0.20 - 1.20 mg/dL 1.43(H) 1.82(H) 1.38(H)  Alkaline Phos 40 - 150 U/L 97 102 99  AST 5 - 34 U/L 32 29 46(H)  ALT 0 - 55 U/L 36 32 52      RADIOGRAPHIC STUDIES: I have personally reviewed the radiological images as listed and agreed with the findings in the report. No results found. Ct Head Wo Contrast  06/06/2015 CLINICAL DATA: Renal cell carcinoma question metastatic disease, diabetes mellitus, hypertension EXAM: CT HEAD WITHOUT CONTRAST TECHNIQUE: Contiguous axial images were obtained from the base of the skull through the vertex without intravenous contrast. COMPARISON: 02/12/2004 FINDINGS: Mild generalized atrophy. Normal ventricular morphology. No midline shift or mass effect. Otherwise normal appearance of brain parenchyma. No intracranial hemorrhage, mass lesion, or evidence acute infarction. No extra-axial fluid collections. Visualized paranasal sinuses and mastoid air cells clear. Bones unremarkable. IMPRESSION: Normal noncontrast CT head. Electronically Signed By: Lavonia Dana M.D. On: 06/06/2015 08:56   Nm Pet Image Initial (pi) Skull Base To Thigh  06/06/2015 CLINICAL DATA: Initial treatment strategy for evaluate lung nodule. History of renal cell  carcinoma. EXAM: NUCLEAR MEDICINE PET SKULL BASE TO THIGH TECHNIQUE: 9.5 mCi F-18 FDG was injected intravenously. Full-ring PET imaging was performed from the skull base to thigh after the radiotracer. CT data was obtained and used for attenuation correction and anatomic localization. FASTING BLOOD GLUCOSE: Value: 254 mg/dl COMPARISON: Chest CT of 05/09/2015. PET of 02/18/2008. FINDINGS: Mild degradation secondary to elevated blood glucose and increased soft tissue uptake. NECK No areas of abnormal hypermetabolism. CHEST The dominant right middle lobe pulmonary nodule is hypermetabolic. Measures 2.0 cm and a S.U.V. max of 6.1 (image 86, series 4). Index left lower lobe pulmonary nodule measures 11 mm and a S.U.V. max of 2.0 (image 112, series 4). No thoracic nodal hypermetabolism. ABDOMEN/PELVIS No intra-abdominal or intrapelvic hypermetabolism. Anterior abdominal wall subcutaneous hypermetabolism is likely related to injection sites. SKELETON No abnormal marrow activity. Note is made of degenerative arthropathy at both acromioclavicular joints. CT IMAGES PERFORMED FOR ATTENUATION CORRECTION Left carotid atherosclerosis. No cervical adenopathy. Thyroidectomy. Chest findings deferred to recent diagnostic CT. Mild cardiomegaly. LAD coronary artery atherosclerosis. Other smaller pulmonary nodules which are below the resolution of PET. Lower pole left renal collecting system calculus. Right nephrectomy. Pneumobilia. Atrophy versus surgical absence of portions of the pancreas. Suboptimally evaluated. Apparent bladder wall thickening, possibly due to underdistention. Mild prostatomegaly. IMPRESSION: 1. Bilateral pulmonary nodules, including a hypermetabolic dominant right middle lobe nodule. Differential considerations include primary bronchogenic carcinoma with pulmonary metastasis versus delayed metastasis from renal cell carcinoma. Consider tissue sampling of the right middle lobe dominant nodule. 2. Otherwise, no  evidence of hypermetabolic metastatic disease after right nephrectomy. 3. Incidental findings, including coronary artery atherosclerosis, left nephrolithiasis, and prostatomegaly. 4. Decreased sensitivity and specificity exam due to technique related factors, as described above. Electronically Signed By: Abigail Miyamoto M.D. On: 06/06/2015 09:11    ASSESSMENT & PLAN:  65 year old Caucasian male with  #1 Remote history of metastatic renal cell carcinoma status post right-sided nephrectomy with multiple recurrences treated with surgical excision as detailed above. He has been NED status for the last 8 years. He stopped following up with the cancer Center due to concerns about medical costs. He has been referred back to [place] by Dr. Sharlett Iles after recent CT  of the chest showed multiple lung nodules concerning for neoplastic etiology and was noted to have recurrent metastatic RCC.  #2 New recurrence of metastatic Renal cell carcinoma with multiple lung metastases. PET/CT positive pulmonary nodule. 2 cm right middle lobe lung nodule appears to be the most prominent and FDG avid. His blood sugars prior to the PET/CT scan were elevated which might reduce the sensitivity of the PET scan.  No overt extrathoracic metastatic disease noted.   CT head unremarkable for evidence of metastatic disease. Lung nodule biopsy confirms -poorly differentiated carcinoma with focal clear cell and focal sarcomatoid features.  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 Confirmed by [place].  #4 mild dehydration - resolved #5 mild thrombocytopenia likely related to Sutent- improved since last visit. #6 slight elevation in bilirubin levels likely due to Sutent. Improved. #7 grade 2 fatigue due to Sutent -resolved today. Plan -Patient feels much better since last clinic visit having had the planned second week off of Sutent. -We will reduce his Sutent dose to 37.5 mg daily  and have him take it 2 weeks on and one-week off which has been found to have less fatigue and potential side effects. -We will see him back again in 3 weeks to reevaluate his labs and tolerability of medications. -Plan to repeat CT after a total of 3 months of treatment including the first cycle.  Return to care with Dr. Irene Limbo in 3 weeks with repeat CBC and CMP. Earlier if any other new concerns.  I spent 15 minutes counseling the patient face to face. The total time spent in the appointment was 15 minutes and more than 50% was on counseling and direct patient cares.    Sullivan Lone MD [place] AAHIVMS [facility] Hematology/Oncology [facility]  (Office):       [phone] (Work cell):  [phone] (Fax):           [phone]

## 2015-08-21 NOTE — Progress Notes (Signed)
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 2) Remote history of metastatic renal cell carcinoma Diagnosed with renal cell carcinoma 20 years ago and had a right nephrectomy. About 8 years after that he was noted to have abdominal recurrence in his pancreas spleen and small intestine and had a Whipple's procedure [May 2001] and significant abdominal surgery and notes that 10 out of 17 lymph nodes were positive. He also had his gallbladder removed. Postoperative course was complicated by an internal hemorrhage as per his report. Patient notes 2-3 years after that he had recurrence in his thyroid that led to a thyroidectomy. [June 2004] later he had partial gastrectomy for local recurrence. [February 2005] when he presented with GI bleeding. Patient notes that he has had no known evidence of recurrence over the last 8 years until his recent CT scan showed lung nodules.  Current Treatment: Sutent 50 mg po daily for 4 weeks on and 2 weeks off.  INTERVAL HISTORY:  Mr Broker is here for for follow-up of his metastatic renal cell carcinoma after having completed the first cycle of Sutent 50 mg by mouth daily for 4 weeks on. He is in his first week off. Notes grade 2 fatigue. Degrees appetite with a few pound weight loss. Some loss of taste. Slight bump in bilirubin level. Decreased oral intake with some evidence of dehydration. He notes that he is a little slower when he is playing his computer games which he really loves to do.  REVIEW OF SYSTEMS:    10 Point review of systems of done and is negative except as noted above.  . Past Medical History  Diagnosis Date  . Arthritis   . Kidney disease   . Diabetes ([place])   . Depression   . Hypertension   . Hypothyroidism   . Cancer [facility])       Renal cell  . Rheumatoid arthritis ([place])   . Nephrolithiasis     . Past Surgical History  Procedure Laterality Date  . Thyroidectomy    . Gastrectomy      tumor removed  . Whipple procedure    . Nephrectomy Right     . Social History  Substance Use Topics  . Smoking status: Never Smoker   . Smokeless tobacco: Never Used  . Alcohol Use: Yes     Comment: occasional beer    ALLERGIES:  has No Known Allergies.  MEDICATIONS:  Current Outpatient Prescriptions  Medication Sig Dispense Refill  . amLODipine (NORVASC) 10 MG tablet Take 1 tablet (10 mg total) by mouth daily. 30 tablet 6  . Ascorbic Acid (VITAMIN C) 1000 MG tablet Take 1,000 mg by mouth daily.    Marland Kitchen aspirin 81 MG tablet Take 81 mg by mouth daily.    Marland Kitchen atenolol (TENORMIN) 100 MG tablet     . augmented betamethasone dipropionate (DIPROLENE-AF) 0.05 % cream   0  . calcium citrate-vitamin D (CITRACAL+D) 315-200 MG-UNIT per tablet Take 2 tablets by mouth daily.     . Cyanocobalamin (VITAMIN B-12) 2500 MCG SUBL Take by mouth daily.     . famotidine (PEPCID) 20 MG tablet Take 20 mg by mouth 2 (two) times daily.    . ferrous sulfate 325 (65 FE) MG tablet Take 325 mg by mouth 2 (two) times daily with a meal.     .  Glucosamine-MSM-Hyaluronic Acd (JOINT HEALTH) 750-375-30 MG TABS Take 2 tablets by mouth daily.    Marland Kitchen HYDROcodone-acetaminophen (NORCO/VICODIN) 5-325 MG tablet Take 1-2 tablets by mouth every 4 (four) hours as needed for moderate pain.    . hydroxychloroquine (PLAQUENIL) 200 MG tablet   0  . insulin lispro (HUMALOG) 100 UNIT/ML injection Inject into the skin 3 (three) times daily before meals.    . Iron-Vitamins (GERITOL COMPLETE) TABS Take 1 tablet by mouth daily.    . Lactobacillus (ACIDOPHILUS) CAPS capsule Take 1 capsule by mouth daily.    Marland Kitchen LEVEMIR 100 UNIT/ML injection Inject 7 Units into the skin 2 (two) times daily.   1  . levothyroxine (SYNTHROID, LEVOTHROID) 300 MCG tablet     . lovastatin (MEVACOR) 40  MG tablet     . minoxidil (LONITEN) 10 MG tablet     . Multiple Vitamin (MULTIVITAMIN) tablet Take 1 tablet by mouth daily.    . Omega-3 Fatty Acids (FISH OIL) 1200 MG CAPS Take 1 capsule by mouth daily.    . Pancrelipase, Lip-Prot-Amyl, (CREON) 6000 UNITS CPEP Take 4 capsules by mouth 3 (three) times daily.    . potassium citrate (UROCIT-K) 10 MEQ (1080 MG) SR tablet     . traMADol (ULTRAM) 50 MG tablet Take by mouth as needed.    . valsartan-hydrochlorothiazide (DIOVAN-HCT) 160-12.5 MG per tablet     . SUNItinib 37.5 MG CAPS 37.5 mg po daily for 2 wks followed by 1 wk rest 28 capsule 1   No current facility-administered medications for this visit.    PHYSICAL EXAMINATION: ECOG PERFORMANCE STATUS: 1 - Symptomatic but completely ambulatory  . Filed Vitals:   08/09/15 0934  BP: 122/74  Pulse: 47  Temp: 98.4 F (36.9 C)  Resp: 17    Filed Weights   08/09/15 0934  Weight: 191 lb 14.4 oz (87.045 kg)   .Body mass index is 27.94 kg/(m^2).  GENERAL:alert, in no acute distress and comfortable SKIN: skin color, texture, turgor are normal, no rashes or significant lesions EYES: normal, conjunctiva are pink and non-injected, sclera clear OROPHARYNX:no exudate, no erythema and lips, buccal mucosa, and tongue normal  NECK: supple, no JVD, thyroid normal size, non-tender, without nodularity LYMPH:  no palpable lymphadenopathy in the cervical, axillary or inguinal LUNGS: clear to auscultation with normal respiratory effort HEART: regular rate & rhythm,  no murmurs and no lower extremity edema ABDOMEN: abdomen soft, non-tender, normoactive bowel sounds  Musculoskeletal: no cyanosis of digits and no clubbing  PSYCH: alert & oriented x 3 with fluent speech NEURO: no focal motor/sensory deficits  LABORATORY DATA:   I have reviewed the data as listed  . CBC Latest Ref Rng 08/09/2015 07/06/2015  WBC 4.0 - 10.3 10e3/uL 3.6(L) 7.5  Hemoglobin 13.0 - 17.1 g/dL 14.5 15.6  Hematocrit 38.4 -  49.9 % 41.4 46.3  Platelets 140 - 400 10e3/uL 108(L) 184    . CMP Latest Ref Rng 08/09/2015 07/06/2015  Glucose 70 - 140 mg/dl 287(H) 72  BUN 7.0 - 26.0 mg/dL 12.2 8.6  Creatinine 0.7 - 1.3 mg/dL 1.7(H) 1.2  Sodium 136 - 145 mEq/L 138 142  Potassium 3.5 - 5.1 mEq/L 3.7 3.6  Chloride 101 - 111 mmol/L - -  CO2 22 - 29 mEq/L 25 26  Calcium 8.4 - 10.4 mg/dL 8.6 9.5  Total Protein 6.4 - 8.3 g/dL 6.0(L) 7.2  Total Bilirubin 0.20 - 1.20 mg/dL 1.82(H) 1.38(H)  Alkaline Phos 40 - 150 U/L 102 99  AST 5 -  34 U/L 29 46(H)  ALT 0 - 55 U/L 32 52   RADIOGRAPHIC STUDIES: I have personally reviewed the radiological images as listed and agreed with the findings in the report. No results found. Ct Head Wo Contrast  06/06/2015 CLINICAL DATA: Renal cell carcinoma question metastatic disease, diabetes mellitus, hypertension EXAM: CT HEAD WITHOUT CONTRAST TECHNIQUE: Contiguous axial images were obtained from the base of the skull through the vertex without intravenous contrast. COMPARISON: 02/12/2004 FINDINGS: Mild generalized atrophy. Normal ventricular morphology. No midline shift or mass effect. Otherwise normal appearance of brain parenchyma. No intracranial hemorrhage, mass lesion, or evidence acute infarction. No extra-axial fluid collections. Visualized paranasal sinuses and mastoid air cells clear. Bones unremarkable. IMPRESSION: Normal noncontrast CT head. Electronically Signed By: Lavonia Dana M.D. On: 06/06/2015 08:56   Nm Pet Image Initial (pi) Skull Base To Thigh  06/06/2015 CLINICAL DATA: Initial treatment strategy for evaluate lung nodule. History of renal cell carcinoma. EXAM: NUCLEAR MEDICINE PET SKULL BASE TO THIGH TECHNIQUE: 9.5 mCi F-18 FDG was injected intravenously. Full-ring PET imaging was performed from the skull base to thigh after the radiotracer. CT data was obtained and used for attenuation correction and anatomic localization. FASTING BLOOD GLUCOSE: Value: 254 mg/dl  COMPARISON: Chest CT of 05/09/2015. PET of 02/18/2008. FINDINGS: Mild degradation secondary to elevated blood glucose and increased soft tissue uptake. NECK No areas of abnormal hypermetabolism. CHEST The dominant right middle lobe pulmonary nodule is hypermetabolic. Measures 2.0 cm and a S.U.V. max of 6.1 (image 86, series 4). Index left lower lobe pulmonary nodule measures 11 mm and a S.U.V. max of 2.0 (image 112, series 4). No thoracic nodal hypermetabolism. ABDOMEN/PELVIS No intra-abdominal or intrapelvic hypermetabolism. Anterior abdominal wall subcutaneous hypermetabolism is likely related to injection sites. SKELETON No abnormal marrow activity. Note is made of degenerative arthropathy at both acromioclavicular joints. CT IMAGES PERFORMED FOR ATTENUATION CORRECTION Left carotid atherosclerosis. No cervical adenopathy. Thyroidectomy. Chest findings deferred to recent diagnostic CT. Mild cardiomegaly. LAD coronary artery atherosclerosis. Other smaller pulmonary nodules which are below the resolution of PET. Lower pole left renal collecting system calculus. Right nephrectomy. Pneumobilia. Atrophy versus surgical absence of portions of the pancreas. Suboptimally evaluated. Apparent bladder wall thickening, possibly due to underdistention. Mild prostatomegaly. IMPRESSION: 1. Bilateral pulmonary nodules, including a hypermetabolic dominant right middle lobe nodule. Differential considerations include primary bronchogenic carcinoma with pulmonary metastasis versus delayed metastasis from renal cell carcinoma. Consider tissue sampling of the right middle lobe dominant nodule. 2. Otherwise, no evidence of hypermetabolic metastatic disease after right nephrectomy. 3. Incidental findings, including coronary artery atherosclerosis, left nephrolithiasis, and prostatomegaly. 4. Decreased sensitivity and specificity exam due to technique related factors, as described above. Electronically Signed By: Abigail Miyamoto M.D.  On: 06/06/2015 09:11    ASSESSMENT & PLAN:  65 year old Caucasian male with  #1 Remote history of metastatic renal cell carcinoma status post right-sided nephrectomy with multiple recurrences treated with surgical excision as detailed above. He has been NED status for the last 8 years. He stopped following up with the cancer Center due to concerns about medical costs. He has been referred back to [place] by Dr. Sharlett Iles after recent CT of the chest showed multiple lung nodules concerning for neoplastic etiology.  #2 New recurrence of metastatic Renal cell carcinoma with multiple lung metastases. PET/CT positive pulmonary nodule. 2 cm right middle lobe lung nodule appears to be the most prominent and FDG avid. His blood sugars prior to the PET/CT scan were elevated which might reduce the sensitivity of the  PET scan.  No overt extrathoracic metastatic disease noted.  CT head unremarkable for evidence of metastatic disease. Lung nodule biopsy confirms -poorly differentiated carcinoma with focal clear cell and focal sarcomatoid features.  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 Confirmed by [place].  #4 mild dehydration #5 mild thrombocytopenia likely related to Sutent #6 slight elevation in bilirubin levels likely due to Sutent. #7 grade 2 fatigue due to Sutent Plan  -Patient was offered IV fluids for mild dehydration due to poor oral intake. He declined politely and would like to try to drink more water at home which is reasonable. -He has another week off the Sutent as per his regimen. -We decided to reassess him in 1 week to determine dose adjustments and further management of his Sutent. -If he continues to feel better as I expect and his liver functions are stable would consider dropping the Sutent to 37.5 mg and do it 2 weeks on one week off. -We intend to reimage him after 3 months of treatment total unless new symptoms arise. -If the  patient has stable disease or improvement in his disease with Sutent treatment will run it by radiation oncology to see if there might be any role for SBRT to his dominant lung lesions. There is some data in preclinical studies that this might open up antigenic epitopes and allow for better immune clearance of the tumor especially in tandem with immunotherapies but potentially also spontaneously. -On the other hand if he has brought her progression this Approach might not be as useful.    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 #10 hypothyroidism related to his thyroidectomy #11 single kidney with chronic kidney disease. #12 nephrolithiasis. 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 Given low risk of reactivation and increased risk of liver toxicity and drug interaction with Sutent we will hold off on treating the patient with INH at this time. Plan  -continue follow-up with primary care physician regarding these other medical problems -continue close followup with rheumatology.  Return to care with Dr. Irene Limbo in 1 week with repeat CBC and CMP.  I spent 20 minutes counseling the patient face to face. The total time spent in the appointment was 25 minutes and more than 50% was on counseling and direct patient cares.    Sullivan Lone MD Bruno AAHIVMS [facility] Hematology/Oncology Physician [facility]  (Office):       [phone] (Work cell):  [phone] (Fax):           [phone]

## 2015-08-23 ENCOUNTER — Encounter: Payer: Self-pay | Admitting: Gastroenterology

## 2015-08-23 ENCOUNTER — Encounter: Payer: Self-pay | Admitting: Internal Medicine

## 2015-08-28 ENCOUNTER — Other Ambulatory Visit: Payer: Self-pay | Admitting: *Deleted

## 2015-08-28 DIAGNOSIS — C649 Malignant neoplasm of unspecified kidney, except renal pelvis: Secondary | ICD-10-CM

## 2015-08-28 MED ORDER — SUNITINIB MALATE 37.5 MG PO CAPS: ORAL_CAPSULE | ORAL | Status: DC

## 2015-09-05 ENCOUNTER — Other Ambulatory Visit: Payer: Self-pay | Admitting: *Deleted

## 2015-09-05 DIAGNOSIS — C649 Malignant neoplasm of unspecified kidney, except renal pelvis: Secondary | ICD-10-CM

## 2015-09-06 ENCOUNTER — Encounter: Payer: Self-pay | Admitting: Hematology

## 2015-09-06 ENCOUNTER — Telehealth: Payer: Self-pay | Admitting: Hematology

## 2015-09-06 ENCOUNTER — Ambulatory Visit (HOSPITAL_BASED_OUTPATIENT_CLINIC_OR_DEPARTMENT_OTHER): Payer: Medicare Other | Admitting: Hematology

## 2015-09-06 ENCOUNTER — Other Ambulatory Visit (HOSPITAL_BASED_OUTPATIENT_CLINIC_OR_DEPARTMENT_OTHER): Payer: Medicare Other

## 2015-09-06 VITALS — BP 133/64 | HR 46 | Temp 98.1°F | Resp 18 | Ht 69.5 in | Wt 194.8 lb

## 2015-09-06 DIAGNOSIS — C7900 Secondary malignant neoplasm of unspecified kidney and renal pelvis: Secondary | ICD-10-CM | POA: Diagnosis not present

## 2015-09-06 DIAGNOSIS — C7800 Secondary malignant neoplasm of unspecified lung: Secondary | ICD-10-CM | POA: Insufficient documentation

## 2015-09-06 DIAGNOSIS — N289 Disorder of kidney and ureter, unspecified: Secondary | ICD-10-CM

## 2015-09-06 DIAGNOSIS — C649 Malignant neoplasm of unspecified kidney, except renal pelvis: Secondary | ICD-10-CM

## 2015-09-06 LAB — COMPREHENSIVE METABOLIC PANEL
ALT: 45 U/L (ref 0–55)
ANION GAP: 11 meq/L (ref 3–11)
AST: 32 U/L (ref 5–34)
Albumin: 3.7 g/dL (ref 3.5–5.0)
Alkaline Phosphatase: 94 U/L (ref 40–150)
BUN: 11.7 mg/dL (ref 7.0–26.0)
CHLORIDE: 104 meq/L (ref 98–109)
CO2: 23 meq/L (ref 22–29)
Calcium: 8.7 mg/dL (ref 8.4–10.4)
Creatinine: 1.4 mg/dL — ABNORMAL HIGH (ref 0.7–1.3)
EGFR: 52 mL/min/{1.73_m2} — AB (ref >90)
Glucose: 228 mg/dl — ABNORMAL HIGH (ref 70–140)
Potassium: 4.1 mEq/L (ref 3.5–5.1)
SODIUM: 138 meq/L (ref 136–145)
Total Bilirubin: 1.35 mg/dL — ABNORMAL HIGH (ref 0.20–1.20)
Total Protein: 6.1 g/dL — ABNORMAL LOW (ref 6.4–8.3)

## 2015-09-06 LAB — CBC & DIFF AND RETIC
BASO%: 0.8 % (ref 0.0–2.0)
Basophils Absolute: 0 10*3/uL (ref 0.0–0.1)
EOS%: 1.3 % (ref 0.0–7.0)
Eosinophils Absolute: 0.1 10*3/uL (ref 0.0–0.5)
HCT: 40.4 % (ref 38.4–49.9)
HGB: 14.3 g/dL (ref 13.0–17.1)
IMMATURE RETIC FRACT: 7.8 % (ref 3.00–10.60)
LYMPH#: 2 10*3/uL (ref 0.9–3.3)
LYMPH%: 38 % (ref 14.0–49.0)
MCH: 35.2 pg — ABNORMAL HIGH (ref 27.2–33.4)
MCHC: 35.4 g/dL (ref 32.0–36.0)
MCV: 99.5 fL — ABNORMAL HIGH (ref 79.3–98.0)
MONO#: 0.7 10*3/uL (ref 0.1–0.9)
MONO%: 13 % (ref 0.0–14.0)
NEUT%: 46.9 % (ref 39.0–75.0)
NEUTROS ABS: 2.5 10*3/uL (ref 1.5–6.5)
Platelets: 246 10*3/uL (ref 140–400)
RBC: 4.06 10*6/uL — AB (ref 4.20–5.82)
RDW: 14.7 % — AB (ref 11.0–14.6)
RETIC %: 4.83 % — AB (ref 0.80–1.80)
RETIC CT ABS: 196.1 10*3/uL — AB (ref 34.80–93.90)
WBC: 5.3 10*3/uL (ref 4.0–10.3)

## 2015-09-06 NOTE — Progress Notes (Signed)
en.  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 2) Remote history of metastatic renal cell carcinoma Diagnosed with renal cell carcinoma 20 years ago and had a right nephrectomy. About 8 years after that he was noted to have abdominal recurrence in his pancreas spleen and small intestine and had a Whipple's procedure and significant abdominal surgery and notes that 10 out of 17 lymph nodes were positive. He also had his gallbladder removed. Postoperative course was complicated by an internal hemorrhage as per his report. Patient notes 2-3 years after that he had recurrence in his thyroid that led to a thyroidectomy. [June 2004] later he had partial gastrectomy for local recurrence. [February 2005] when he presented with GI bleeding. Patient notes that he has had no known evidence of recurrence over the last 8 years until his recent CT scan showed lung nodules.  Current Treatment: Sutent 37.5 mg po daily for 2 weeks on and 1 weeks off.  Previous treatment Sutent on cycle 1 - 50 mg by mouth daily for 4 weeks on and 2-weeks off. He was dose adjusted to 37.5 mg by mouth daily for 2 weeks with 1 week of to help mitigate issues with fatigue, mild hyperbilirubinemia, cytopenias.  INTERVAL HISTORY:  Dylan Jefferson is here for for his scheduled follow-up to evaluate how he is doing on the reduced dose of Sutent. He notes that he is feeling well and has no acute concerns or symptoms today. He has much happier with how he feels on the 37.5 mg daily of Sutent. He has not noted significant fatigue. Good oral intake. Good energy levels. No slowing of mentation as he felt with the higher dose. He notes that he is feeling much sharper and beat the computer chess twice which he was quite happy about. No other  acute new concerns.  REVIEW OF SYSTEMS:    10 Point review of systems of done and is negative except as noted above.  . Past Medical History  Diagnosis Date  . Arthritis   . Kidney disease   . Diabetes ([place])   . Depression   . Hypertension   . Hypothyroidism   . [facility]     Renal cell  . Rheumatoid arthritis ([place])   . Nephrolithiasis     . Past Surgical History  Procedure Laterality Date  . Thyroidectomy    . Gastrectomy      tumor removed  . Whipple procedure    . Nephrectomy Right     . Social History  Substance Use Topics  . Smoking status: Never Smoker   . Smokeless tobacco: Never Used  . Alcohol Use: Yes     Comment: occasional beer    ALLERGIES:  has No Known Allergies.  MEDICATIONS:  Current Outpatient Prescriptions  Medication Sig Dispense Refill  . amLODipine (NORVASC) 10 MG tablet Take 1 tablet (10 mg total) by mouth daily. 30 tablet 6  . Ascorbic Acid (VITAMIN C) 1000 MG tablet Take 1,000 mg by mouth daily.    Marland Kitchen aspirin 81 MG tablet Take 81 mg by mouth daily.    Marland Kitchen augmented betamethasone dipropionate (DIPROLENE-AF) 0.05 % cream   0  . calcium citrate-vitamin D (CITRACAL+D) 315-200 MG-UNIT per tablet Take 2 tablets by mouth daily.     . Cyanocobalamin (VITAMIN B-12) 2500 MCG SUBL Take by  mouth daily.     . famotidine (PEPCID) 20 MG tablet Take 20 mg by mouth 2 (two) times daily.    . ferrous sulfate 325 (65 FE) MG tablet Take 325 mg by mouth 2 (two) times daily with a meal.     . Glucosamine-MSM-Hyaluronic Acd (JOINT HEALTH) 750-375-30 MG TABS Take 2 tablets by mouth daily.    Marland Kitchen HYDROcodone-acetaminophen (NORCO/VICODIN) 5-325 MG tablet Take 1-2 tablets by mouth every 4 (four) hours as needed for moderate pain.    . hydroxychloroquine (PLAQUENIL) 200 MG tablet   0  . insulin lispro (HUMALOG) 100 UNIT/ML injection Inject into the skin 3 (three) times daily before meals.    . Iron-Vitamins (GERITOL COMPLETE) TABS Take 1 tablet by mouth daily.    .  Lactobacillus (ACIDOPHILUS) CAPS capsule Take 1 capsule by mouth daily.    Marland Kitchen LEVEMIR 100 UNIT/ML injection Inject 7 Units into the skin 2 (two) times daily.   1  . levothyroxine (SYNTHROID, LEVOTHROID) 300 MCG tablet     . lovastatin (MEVACOR) 40 MG tablet     . minoxidil (LONITEN) 10 MG tablet     . Multiple Vitamin (MULTIVITAMIN) tablet Take 1 tablet by mouth daily.    . Omega-3 Fatty Acids (FISH OIL) 1200 MG CAPS Take 1 capsule by mouth daily.    . Pancrelipase, Lip-Prot-Amyl, (CREON) 6000 UNITS CPEP Take 4 capsules by mouth 3 (three) times daily.    . potassium citrate (UROCIT-K) 10 MEQ (1080 MG) SR tablet     . SUNItinib 37.5 MG CAPS Take 1 tablet (37.5 mg) by mouth daily for 2 wks followed by 1 wk rest 28 capsule 1  . traMADol (ULTRAM) 50 MG tablet Take by mouth as needed.    . valsartan-hydrochlorothiazide (DIOVAN-HCT) 160-12.5 MG per tablet     . atenolol (TENORMIN) 100 MG tablet      No current facility-administered medications for this visit.    PHYSICAL EXAMINATION: ECOG PERFORMANCE STATUS: 1 - Symptomatic but completely ambulatory  . Filed Vitals:   09/06/15 0820  BP: 133/64  Pulse: 46  Temp: 98.1 F (36.7 C)  Resp: 18    Filed Weights   09/06/15 0820  Weight: 194 lb 12.8 oz (88.361 kg)   .Body mass index is 28.36 kg/(m^2).  GENERAL:alert, in no acute distress and comfortable SKIN: skin color, texture, turgor are normal, no rashes or significant lesions EYES: normal, conjunctiva are pink and non-injected, sclera clear OROPHARYNX:no exudate, no erythema and lips, buccal mucosa, and tongue normal  NECK: supple, no JVD, thyroid normal size, non-tender, without nodularity LYMPH:  no palpable lymphadenopathy in the cervical, axillary or inguinal LUNGS: clear to auscultation with normal respiratory effort HEART: regular rate & rhythm,  no murmurs and no lower extremity edema ABDOMEN: abdomen soft, non-tender, normoactive bowel sounds  Musculoskeletal: no cyanosis of  digits and no clubbing  PSYCH: alert & oriented x 3 with fluent speech NEURO: no focal motor/sensory deficits  LABORATORY DATA:   I have reviewed the data as listed  . CBC Latest Ref Rng 09/06/2015 08/16/2015 08/09/2015  WBC 4.0 - 10.3 10e3/uL 5.3 4.1 3.6(L)  Hemoglobin 13.0 - 17.1 g/dL 14.3 13.1 14.5  Hematocrit 38.4 - 49.9 % 40.4 37.6(L) 41.4  Platelets 140 - 400 10e3/uL 246 136(L) 108(L)     . CMP Latest Ref Rng 09/06/2015 08/16/2015 08/09/2015  Glucose 70 - 140 mg/dl 228(H) 217(H) 287(H)  BUN 7.0 - 26.0 mg/dL 11.7 11.7 12.2  Creatinine 0.7 - 1.3 mg/dL  1.4(H) 1.4(H) 1.7(H)  Sodium 136 - 145 mEq/L 138 138 138  Potassium 3.5 - 5.1 mEq/L 4.1 4.0 3.7  Chloride 101 - 111 mmol/L - - -  CO2 22 - 29 mEq/L 23 24 25   Calcium 8.4 - 10.4 mg/dL 8.7 8.9 8.6  Total Protein 6.4 - 8.3 g/dL 6.1(L) 6.0(L) 6.0(L)  Total Bilirubin 0.20 - 1.20 mg/dL 1.35(H) 1.43(H) 1.82(H)  Alkaline Phos 40 - 150 U/L 94 97 102  AST 5 - 34 U/L 32 32 29  ALT 0 - 55 U/L 45 36 32      RADIOGRAPHIC STUDIES: I have personally reviewed the radiological images as listed and agreed with the findings in the report. No results found. Ct Head Wo Contrast  06/06/2015 CLINICAL DATA: Renal cell carcinoma question metastatic disease, diabetes mellitus, hypertension EXAM: CT HEAD WITHOUT CONTRAST TECHNIQUE: Contiguous axial images were obtained from the base of the skull through the vertex without intravenous contrast. COMPARISON: 02/12/2004 FINDINGS: Mild generalized atrophy. Normal ventricular morphology. No midline shift or mass effect. Otherwise normal appearance of brain parenchyma. No intracranial hemorrhage, mass lesion, or evidence acute infarction. No extra-axial fluid collections. Visualized paranasal sinuses and mastoid air cells clear. Bones unremarkable. IMPRESSION: Normal noncontrast CT head. Electronically Signed By: Lavonia Dana M.D. On: 06/06/2015 08:56   Nm Pet Image Initial (pi) Skull Base To Thigh  06/06/2015  CLINICAL DATA: Initial treatment strategy for evaluate lung nodule. History of renal cell carcinoma. EXAM: NUCLEAR MEDICINE PET SKULL BASE TO THIGH TECHNIQUE: 9.5 mCi F-18 FDG was injected intravenously. Full-ring PET imaging was performed from the skull base to thigh after the radiotracer. CT data was obtained and used for attenuation correction and anatomic localization. FASTING BLOOD GLUCOSE: Value: 254 mg/dl COMPARISON: Chest CT of 05/09/2015. PET of 02/18/2008. FINDINGS: Mild degradation secondary to elevated blood glucose and increased soft tissue uptake. NECK No areas of abnormal hypermetabolism. CHEST The dominant right middle lobe pulmonary nodule is hypermetabolic. Measures 2.0 cm and a S.U.V. max of 6.1 (image 86, series 4). Index left lower lobe pulmonary nodule measures 11 mm and a S.U.V. max of 2.0 (image 112, series 4). No thoracic nodal hypermetabolism. ABDOMEN/PELVIS No intra-abdominal or intrapelvic hypermetabolism. Anterior abdominal wall subcutaneous hypermetabolism is likely related to injection sites. SKELETON No abnormal marrow activity. Note is made of degenerative arthropathy at both acromioclavicular joints. CT IMAGES PERFORMED FOR ATTENUATION CORRECTION Left carotid atherosclerosis. No cervical adenopathy. Thyroidectomy. Chest findings deferred to recent diagnostic CT. Mild cardiomegaly. LAD coronary artery atherosclerosis. Other smaller pulmonary nodules which are below the resolution of PET. Lower pole left renal collecting system calculus. Right nephrectomy. Pneumobilia. Atrophy versus surgical absence of portions of the pancreas. Suboptimally evaluated. Apparent bladder wall thickening, possibly due to underdistention. Mild prostatomegaly. IMPRESSION: 1. Bilateral pulmonary nodules, including a hypermetabolic dominant right middle lobe nodule. Differential considerations include primary bronchogenic carcinoma with pulmonary metastasis versus delayed metastasis from renal cell  carcinoma. Consider tissue sampling of the right middle lobe dominant nodule. 2. Otherwise, no evidence of hypermetabolic metastatic disease after right nephrectomy. 3. Incidental findings, including coronary artery atherosclerosis, left nephrolithiasis, and prostatomegaly. 4. Decreased sensitivity and specificity exam due to technique related factors, as described above. Electronically Signed By: Abigail Miyamoto M.D. On: 06/06/2015 09:11    ASSESSMENT & PLAN:  65 year old Caucasian male with  #1 Remote history of metastatic renal cell carcinoma status post right-sided nephrectomy with multiple recurrences treated with surgical excision as detailed above. He has been NED status for the last 8 years. He  stopped following up with the cancer Center due to concerns about medical costs. He has been referred back to [place] by Dr. Sharlett Iles after recent CT of the chest showed multiple lung nodules concerning for neoplastic etiology and was noted to have recurrent metastatic RCC.  #2 New recurrence of metastatic Renal cell carcinoma with multiple lung metastases. PET/CT positive pulmonary nodule. 2 cm right middle lobe lung nodule appears to be the most prominent and FDG avid. His blood sugars prior to the PET/CT scan were elevated which might reduce the sensitivity of the PET scan.  No overt extrathoracic metastatic disease noted.   CT head unremarkable for evidence of metastatic disease. Lung nodule biopsy confirms -poorly differentiated carcinoma with focal clear cell and focal sarcomatoid features.  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 Confirmed by [place].  #4 mild dehydration - resolved #5 mild thrombocytopenia likely related to Sutent- resolved with dose reduction. #6 slight elevation in bilirubin levels likely due to Sutent. Improved. #7 grade 2 fatigue due to Sutent - now resolved with dose adjustment. Plan -Patient feels much better since  last clinic visit having had dose adjustment of Sutent to 37.5 mg by mouth daily. -We will continue with his reduced dose of Sutent dose to 37.5 mg daily and have him take it 2 weeks on and one-week off - A repeat PET/CT scan after a total of 3 months of treatment. Cannot do CT scan with IV contrast due to his renal function.  Return to care with Dr. Irene Limbo in 5 weeks on 10/11/2015 with repeat CBC and CMP and restaging PET/CT scan. Earlier if any other new concerns.  I spent 15 minutes counseling the patient face to face. The total time spent in the appointment was 15 minutes and more than 50% was on counseling and direct patient cares.    Sullivan Lone MD [place] AAHIVMS [facility]  (Office):       [phone] (Work cell):  [phone] (Fax):           [phone]

## 2015-09-06 NOTE — Telephone Encounter (Signed)
Schedule patient appt per pof, avs report printed.  °

## 2015-09-20 ENCOUNTER — Telehealth: Payer: Self-pay | Admitting: Pharmacist

## 2015-09-20 ENCOUNTER — Encounter: Payer: Self-pay | Admitting: Pharmacist

## 2015-09-20 NOTE — Telephone Encounter (Signed)
09/20/15: Attempted to reach patient for follow up on oral medication: Sutent. No answer. Left VM for patient to call back with any questions or issues.   Thank you,  Montel Clock, PharmD, [facility] [phone]

## 2015-09-20 NOTE — Progress Notes (Signed)
Oral Chemotherapy Follow-Up Form  Original Start date of oral chemotherapy: _12/2/16__   Called patient today to follow up regarding patient's oral chemotherapy medication: _Sutent_  Pt is doing well today. Energy levels are improved with lower dose of Sutent (37.5 mg 2 weeks on, 1 week off). Still having issues with his taste buds causing foods to taste strange.   Pt reports __0__ tablets/doses missed in the last week/month.    Pt reports the following side effects: _taste bud changes____    Will follow up and call patient again in __3 weeks____   Thank you,  Montel Clock, PharmD, [facility]

## 2015-10-08 ENCOUNTER — Ambulatory Visit (HOSPITAL_COMMUNITY)
Admission: RE | Admit: 2015-10-08 | Discharge: 2015-10-08 | Disposition: A | Discharge status: Home / Self Care | Payer: Medicare Other | Source: Ambulatory Visit | Attending: Hematology | Admitting: Hematology

## 2015-10-08 DIAGNOSIS — Z9889 Other specified postprocedural states: Secondary | ICD-10-CM | POA: Insufficient documentation

## 2015-10-08 DIAGNOSIS — C7800 Secondary malignant neoplasm of unspecified lung: Secondary | ICD-10-CM | POA: Insufficient documentation

## 2015-10-08 DIAGNOSIS — I2510 Atherosclerotic heart disease of native coronary artery without angina pectoris: Secondary | ICD-10-CM | POA: Insufficient documentation

## 2015-10-08 DIAGNOSIS — N200 Calculus of kidney: Secondary | ICD-10-CM | POA: Diagnosis not present

## 2015-10-08 LAB — GLUCOSE, CAPILLARY: Glucose-Capillary: 143 mg/dL — ABNORMAL HIGH (ref 65–99)

## 2015-10-08 MED ORDER — FLUDEOXYGLUCOSE F - 18 (FDG) INJECTION
9.5400 | Freq: Once | INTRAVENOUS | Status: AC | PRN
  Administered 2015-10-08: 9.54 via INTRAVENOUS

## 2015-10-11 ENCOUNTER — Encounter: Payer: Self-pay | Admitting: Hematology

## 2015-10-11 ENCOUNTER — Other Ambulatory Visit (HOSPITAL_BASED_OUTPATIENT_CLINIC_OR_DEPARTMENT_OTHER): Payer: Medicare Other

## 2015-10-11 ENCOUNTER — Encounter: Payer: Self-pay | Admitting: Pharmacist

## 2015-10-11 ENCOUNTER — Telehealth: Payer: Self-pay | Admitting: Hematology

## 2015-10-11 ENCOUNTER — Ambulatory Visit (HOSPITAL_BASED_OUTPATIENT_CLINIC_OR_DEPARTMENT_OTHER): Payer: Medicare Other | Admitting: Hematology

## 2015-10-11 VITALS — BP 139/74 | HR 50 | Temp 98.6°F | Resp 18 | Ht 69.5 in | Wt 196.3 lb

## 2015-10-11 DIAGNOSIS — C7800 Secondary malignant neoplasm of unspecified lung: Secondary | ICD-10-CM

## 2015-10-11 DIAGNOSIS — C649 Malignant neoplasm of unspecified kidney, except renal pelvis: Secondary | ICD-10-CM

## 2015-10-11 DIAGNOSIS — R2232 Localized swelling, mass and lump, left upper limb: Secondary | ICD-10-CM

## 2015-10-11 LAB — COMPREHENSIVE METABOLIC PANEL
ALBUMIN: 3.7 g/dL (ref 3.5–5.0)
ALK PHOS: 89 U/L (ref 40–150)
ALT: 32 U/L (ref 0–55)
AST: 33 U/L (ref 5–34)
Anion Gap: 7 mEq/L (ref 3–11)
BUN: 5.4 mg/dL — AB (ref 7.0–26.0)
CALCIUM: 8.9 mg/dL (ref 8.4–10.4)
CO2: 29 mEq/L (ref 22–29)
CREATININE: 1.5 mg/dL — AB (ref 0.7–1.3)
Chloride: 104 mEq/L (ref 98–109)
EGFR: 49 mL/min/{1.73_m2} — ABNORMAL LOW (ref >90)
Glucose: 167 mg/dl — ABNORMAL HIGH (ref 70–140)
Potassium: 4.6 mEq/L (ref 3.5–5.1)
Sodium: 140 mEq/L (ref 136–145)
Total Bilirubin: 1.93 mg/dL — ABNORMAL HIGH (ref 0.20–1.20)
Total Protein: 6.1 g/dL — ABNORMAL LOW (ref 6.4–8.3)

## 2015-10-11 LAB — CBC & DIFF AND RETIC
BASO%: 0.3 % (ref 0.0–2.0)
BASOS ABS: 0 10*3/uL (ref 0.0–0.1)
EOS ABS: 0.1 10*3/uL (ref 0.0–0.5)
EOS%: 1.4 % (ref 0.0–7.0)
HEMATOCRIT: 40.2 % (ref 38.4–49.9)
HEMOGLOBIN: 13.8 g/dL (ref 13.0–17.1)
Immature Retic Fract: 3 % (ref 3.00–10.60)
LYMPH#: 2.1 10*3/uL (ref 0.9–3.3)
LYMPH%: 36.3 % (ref 14.0–49.0)
MCH: 36.3 pg — ABNORMAL HIGH (ref 27.2–33.4)
MCHC: 34.3 g/dL (ref 32.0–36.0)
MCV: 105.8 fL — ABNORMAL HIGH (ref 79.3–98.0)
MONO#: 0.7 10*3/uL (ref 0.1–0.9)
MONO%: 12.5 % (ref 0.0–14.0)
NEUT#: 2.9 10*3/uL (ref 1.5–6.5)
NEUT%: 49.5 % (ref 39.0–75.0)
Platelets: 159 10*3/uL (ref 140–400)
RBC: 3.8 10*6/uL — ABNORMAL LOW (ref 4.20–5.82)
RDW: 14.4 % (ref 11.0–14.6)
RETIC %: 1.48 % (ref 0.80–1.80)
RETIC CT ABS: 56.24 10*3/uL (ref 34.80–93.90)
WBC: 5.8 10*3/uL (ref 4.0–10.3)

## 2015-10-11 MED ORDER — SUNITINIB MALATE 37.5 MG PO CAPS: ORAL_CAPSULE | ORAL | Status: DC

## 2015-10-11 NOTE — Telephone Encounter (Signed)
Gave and printed appt shced and avs for pt for April °

## 2015-10-11 NOTE — Progress Notes (Signed)
Oral Chemotherapy Follow-Up Form  Original Start date of oral chemotherapy: _12/2/16__   Called patient today to follow up regarding patient's oral chemotherapy medication: _Sutent_  Pt is doing well today. Energy levels are improved with lower dose of Sutent (37.5 mg 2 weeks on, 1 week off). Still having issues with his taste buds causing foods to taste strange. Pt saw Dr. Irene Limbo today. Good response to therapy on PET scan but there may be a possible lung nodule. May consider radiation if radiologist feels treatment is necessary.   Pt reports __0__ tablets/doses missed in the last month.    Pt reports the following side effects: _taste bud changes____    Will follow up and call patient again in __3 weeks____   Thank you,  Montel Clock, PharmD, [facility]

## 2015-10-11 NOTE — Progress Notes (Signed)
en.  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 2) Remote history of metastatic renal cell carcinoma Diagnosed with renal cell carcinoma 20 years ago and had a right nephrectomy. About 8 years after that he was noted to have abdominal recurrence in his pancreas spleen and small intestine and had a Whipple's procedure and significant abdominal surgery and notes that 10 out of 17 lymph nodes were positive. He also had his gallbladder removed. Postoperative course was complicated by an internal hemorrhage as per his report. Patient notes 2-3 years after that he had recurrence in his thyroid that led to a thyroidectomy. [June 2004] later he had partial gastrectomy for local recurrence. [February 2005] when he presented with GI bleeding. Patient notes that he has had no known evidence of recurrence over the last 8 years until his recent CT scan showed lung nodules.  Current Treatment: Sutent 37.5 mg po daily for 2 weeks on and 1 weeks off. (completed cycle 2)  Previous treatment Sutent on cycle 1 - 50 mg by mouth daily for 4 weeks on and 2-weeks off. He was dose adjusted to 37.5 mg by mouth daily for 2 weeks with 1 week of to help mitigate issues with fatigue, mild hyperbilirubinemia, cytopenias.  INTERVAL HISTORY:  [name] is here for for his scheduled follow-up after having completed his second cycle of Sutent. Notes that he has tolerated this dose and schedule much better. No acute new concerns. Some grade 1 fatigue. Notes a little bit of dysguesia but that he is eating well. Notes the chronic noted on his left hand which she feels might be slightly larger in size. He notes that he would like to have his rheumatologist evaluate this next month at his appointment since he "gets X rays  with them anyways". We discussed the PET/CT scan results. He is okay with me giving him a referral to evaluate whether radiation oncologist to consider SRS to his dominant right middle lobe lung lesion. No other acute concerns. He is in good spirits.  REVIEW OF SYSTEMS:    10 Point review of systems of done and is negative except as noted above.  . Past Medical History  Diagnosis Date  . Arthritis   . Kidney disease   . Diabetes ([place])   . Depression   . Hypertension   . Hypothyroidism   . Cancer [facility])     Renal cell  . Rheumatoid arthritis ([place])   . Nephrolithiasis     . Past Surgical History  Procedure Laterality Date  . Thyroidectomy    . Gastrectomy      tumor removed  . Whipple procedure    . Nephrectomy Right     . Social History  Substance Use Topics  . Smoking status: Never Smoker   . Smokeless tobacco: Never Used  . Alcohol Use: Yes     Comment: occasional beer    ALLERGIES:  has No Known Allergies.  MEDICATIONS:  Current Outpatient Prescriptions  Medication Sig Dispense Refill  . amLODipine (NORVASC) 10 MG tablet Take 1 tablet (10 mg total) by mouth daily. 30 tablet 6  . Ascorbic Acid (VITAMIN C) 1000 MG tablet Take 1,000 mg by mouth daily.    Marland Kitchen aspirin 81 MG tablet Take 81 mg by mouth daily.    Marland Kitchen atenolol (TENORMIN) 100 MG  tablet     . augmented betamethasone dipropionate (DIPROLENE-AF) 0.05 % cream   0  . calcium citrate-vitamin D (CITRACAL+D) 315-200 MG-UNIT per tablet Take 2 tablets by mouth daily.     . Cyanocobalamin (VITAMIN B-12) 2500 MCG SUBL Take by mouth daily.     . famotidine (PEPCID) 20 MG tablet Take 20 mg by mouth 2 (two) times daily.    . ferrous sulfate 325 (65 FE) MG tablet Take 325 mg by mouth 2 (two) times daily with a meal.     . Glucosamine-MSM-Hyaluronic Acd (JOINT HEALTH) 750-375-30 MG TABS Take 2 tablets by mouth daily.    Marland Kitchen HYDROcodone-acetaminophen (NORCO/VICODIN) 5-325 MG tablet Take 1-2 tablets by mouth every 4 (four)  hours as needed for moderate pain.    . hydroxychloroquine (PLAQUENIL) 200 MG tablet   0  . insulin lispro (HUMALOG) 100 UNIT/ML injection Inject into the skin 3 (three) times daily before meals.    . Iron-Vitamins (GERITOL COMPLETE) TABS Take 1 tablet by mouth daily.    . Lactobacillus (ACIDOPHILUS) CAPS capsule Take 1 capsule by mouth daily.    Marland Kitchen LEVEMIR 100 UNIT/ML injection Inject 7 Units into the skin 2 (two) times daily.   1  . levothyroxine (SYNTHROID, LEVOTHROID) 300 MCG tablet     . lovastatin (MEVACOR) 40 MG tablet     . minoxidil (LONITEN) 10 MG tablet     . Multiple Vitamin (MULTIVITAMIN) tablet Take 1 tablet by mouth daily.    . Omega-3 Fatty Acids (FISH OIL) 1200 MG CAPS Take 1 capsule by mouth daily.    . Pancrelipase, Lip-Prot-Amyl, (CREON) 6000 UNITS CPEP Take 4 capsules by mouth 3 (three) times daily.    . potassium citrate (UROCIT-K) 10 MEQ (1080 MG) SR tablet     . SUNItinib (SUTENT) 37.5 MG capsule Take 1 tablet (37.5 mg) by mouth daily for 2 wks followed by 1 wk rest 28 capsule 1  . traMADol (ULTRAM) 50 MG tablet Take by mouth as needed.    . valsartan-hydrochlorothiazide (DIOVAN-HCT) 160-12.5 MG per tablet      No current facility-administered medications for this visit.    PHYSICAL EXAMINATION: ECOG PERFORMANCE STATUS: 1 - Symptomatic but completely ambulatory  . Filed Vitals:   10/11/15 0815  BP: 139/74  Pulse: 50  Temp: 98.6 F (37 C)  Resp: 18    Filed Weights   10/11/15 0815  Weight: 196 lb 4.8 oz (89.041 kg)   .Body mass index is 28.58 kg/(m^2).  GENERAL:alert, in no acute distress and comfortable SKIN: skin color, texture, turgor are normal, no rashes or significant lesions EYES: normal, conjunctiva are pink and non-injected, sclera clear OROPHARYNX:no exudate, no erythema and lips, buccal mucosa, and tongue normal  NECK: supple, no JVD, thyroid normal size, non-tender, without nodularity LYMPH:  no palpable lymphadenopathy in the cervical,  axillary or inguinal LUNGS: clear to auscultation with normal respiratory effort HEART: regular rate & rhythm,  no murmurs and no lower extremity edema ABDOMEN: abdomen soft, non-tender, normoactive bowel sounds  Musculoskeletal: no cyanosis of digits and no clubbing  PSYCH: alert & oriented x 3 with fluent speech NEURO: no focal motor/sensory deficits  LABORATORY DATA:   I have reviewed the data as listed  . CBC Latest Ref Rng 10/11/2015 09/06/2015 08/16/2015  WBC 4.0 - 10.3 10e3/uL 5.8 5.3 4.1  Hemoglobin 13.0 - 17.1 g/dL 13.8 14.3 13.1  Hematocrit 38.4 - 49.9 % 40.2 40.4 37.6(L)  Platelets 140 - 400 10e3/uL 159 246 136(L)   .  CBC    Component Value Date/Time   WBC 5.8 10/11/2015 0747   WBC 7.3 06/13/2015 0915   RBC 3.80* 10/11/2015 0747   RBC 4.36 06/13/2015 0915   HGB 13.8 10/11/2015 0747   HGB 14.8 06/13/2015 0915   HCT 40.2 10/11/2015 0747   HCT 43.1 06/13/2015 0915   PLT 159 10/11/2015 0747   PLT 214 06/13/2015 0915   MCV 105.8* 10/11/2015 0747   MCV 98.9 06/13/2015 0915   MCH 36.3* 10/11/2015 0747   MCH 33.9 06/13/2015 0915   MCHC 34.3 10/11/2015 0747   MCHC 34.3 06/13/2015 0915   RDW 14.4 10/11/2015 0747   RDW 12.8 06/13/2015 0915   LYMPHSABS 2.1 10/11/2015 0747   LYMPHSABS 2.0 06/13/2015 0915   MONOABS 0.7 10/11/2015 0747   MONOABS 0.9 06/13/2015 0915   EOSABS 0.1 10/11/2015 0747   EOSABS 0.2 06/13/2015 0915   BASOSABS 0.0 10/11/2015 0747   BASOSABS 0.1 06/13/2015 0915      . CMP Latest Ref Rng 10/11/2015 09/06/2015 08/16/2015  Glucose 70 - 140 mg/dl 167(H) 228(H) 217(H)  BUN 7.0 - 26.0 mg/dL 5.4(L) 11.7 11.7  Creatinine 0.7 - 1.3 mg/dL 1.5(H) 1.4(H) 1.4(H)  Sodium 136 - 145 mEq/L 140 138 138  Potassium 3.5 - 5.1 mEq/L 4.6 4.1 4.0  CO2 22 - 29 mEq/L 29 23 24   Calcium 8.4 - 10.4 mg/dL 8.9 8.7 8.9  Total Protein 6.4 - 8.3 g/dL 6.1(L) 6.1(L) 6.0(L)  Total Bilirubin 0.20 - 1.20 mg/dL 1.93(H) 1.35(H) 1.43(H)  Alkaline Phos 40 - 150 U/L 89 94 97  AST 5 - 34  U/L 33 32 32  ALT 0 - 55 U/L 32 45 36      RADIOGRAPHIC STUDIES: I have personally reviewed the radiological images as listed and agreed with the findings in the report. Nm Pet Image Restag (ps) Skull Base To Thigh  10/08/2015  CLINICAL DATA:  Subsequent treatment strategy for metastatic renal cell carcinoma with history of lung, thyroid, gastric and pancreatic metastases. EXAM: NUCLEAR MEDICINE PET SKULL BASE TO THIGH TECHNIQUE: 9.5 mCi F-18 FDG was injected intravenously. Full-ring PET imaging was performed from the skull base to thigh after the radiotracer. CT data was obtained and used for attenuation correction and anatomic localization. FASTING BLOOD GLUCOSE:  Value: 143 mg/dl COMPARISON:  06/06/2015 PET-CT. FINDINGS: NECK No hypermetabolic lymph nodes in the neck. Status post total thyroidectomy. CHEST There is a hypermetabolic 1.7 cm right middle lobe pulmonary nodule (series 8/image 40 closed with max SUV 10.6, previously 2.1 cm with max SUV 6.1, decreased in size and increased in metabolism. Additional previously described bilateral pulmonary nodules have all decreased in size and demonstrate no significant metabolism, for example a 0.4 cm lingular pulmonary nodule (series 8/image 47) is decreased from 0.6 cm and a left lower lobe 0.7 cm pulmonary nodule (series 8/image 69) is decreased from 1.2 cm. No acute consolidative airspace disease or new significant pulmonary nodules. No hypermetabolic axillary, mediastinal or hilar nodes. Coronary atherosclerosis. ABDOMEN/PELVIS No abnormal hypermetabolic activity within the liver or adrenal glands. No hypermetabolic lymph nodes in the abdomen or pelvis. Hypermetabolism in the superficial subcutaneous ventral abdominal wall is presumably due to injection of medications. Stable postsurgical changes from partial gastrectomy with gastrojejunostomy, pancreatectomy, cholecystectomy, splenectomy and choledochojejunostomy with expected pneumobilia. Status post  right nephrectomy with no hypermetabolic mass or other abnormal finding in the right nephrectomy bed. Nonobstructing 8 mm stone in the lower left kidney. No left hydronephrosis. Top-normal size prostate. SKELETON No focal hypermetabolic  activity to suggest skeletal metastasis. IMPRESSION: 1. Pulmonary metastases have all decreased in size. Dominant hypermetabolic right middle lobe pulmonary metastasis, however, has increased in metabolism. Treatment response is overall mixed. 2. No new sites of hypermetabolic metastatic disease. 3. Stable extensive postsurgical changes in the neck and abdomen as described. 4. Nonobstructing left renal stone.  No left hydronephrosis. 5. Coronary atherosclerosis. Electronically Signed   By: Ilona Sorrel M.D.   On: 10/08/2015 10:21   Ct Head Wo Contrast  06/06/2015 CLINICAL DATA: Renal cell carcinoma question metastatic disease, diabetes mellitus, hypertension EXAM: CT HEAD WITHOUT CONTRAST TECHNIQUE: Contiguous axial images were obtained from the base of the skull through the vertex without intravenous contrast. COMPARISON: 02/12/2004 FINDINGS: Mild generalized atrophy. Normal ventricular morphology. No midline shift or mass effect. Otherwise normal appearance of brain parenchyma. No intracranial hemorrhage, mass lesion, or evidence acute infarction. No extra-axial fluid collections. Visualized paranasal sinuses and mastoid air cells clear. Bones unremarkable. IMPRESSION: Normal noncontrast CT head. Electronically Signed By: Lavonia Dana M.D. On: 06/06/2015 08:56   Nm Pet Image Initial (pi) Skull Base To Thigh  06/06/2015 CLINICAL DATA: Initial treatment strategy for evaluate lung nodule. History of renal cell carcinoma. EXAM: NUCLEAR MEDICINE PET SKULL BASE TO THIGH TECHNIQUE: 9.5 mCi F-18 FDG was injected intravenously. Full-ring PET imaging was performed from the skull base to thigh after the radiotracer. CT data was obtained and used for attenuation correction and  anatomic localization. FASTING BLOOD GLUCOSE: Value: 254 mg/dl COMPARISON: Chest CT of 05/09/2015. PET of 02/18/2008. FINDINGS: Mild degradation secondary to elevated blood glucose and increased soft tissue uptake. NECK No areas of abnormal hypermetabolism. CHEST The dominant right middle lobe pulmonary nodule is hypermetabolic. Measures 2.0 cm and a S.U.V. max of 6.1 (image 86, series 4). Index left lower lobe pulmonary nodule measures 11 mm and a S.U.V. max of 2.0 (image 112, series 4). No thoracic nodal hypermetabolism. ABDOMEN/PELVIS No intra-abdominal or intrapelvic hypermetabolism. Anterior abdominal wall subcutaneous hypermetabolism is likely related to injection sites. SKELETON No abnormal marrow activity. Note is made of degenerative arthropathy at both acromioclavicular joints. CT IMAGES PERFORMED FOR ATTENUATION CORRECTION Left carotid atherosclerosis. No cervical adenopathy. Thyroidectomy. Chest findings deferred to recent diagnostic CT. Mild cardiomegaly. LAD coronary artery atherosclerosis. Other smaller pulmonary nodules which are below the resolution of PET. Lower pole left renal collecting system calculus. Right nephrectomy. Pneumobilia. Atrophy versus surgical absence of portions of the pancreas. Suboptimally evaluated. Apparent bladder wall thickening, possibly due to underdistention. Mild prostatomegaly. IMPRESSION: 1. Bilateral pulmonary nodules, including a hypermetabolic dominant right middle lobe nodule. Differential considerations include primary bronchogenic carcinoma with pulmonary metastasis versus delayed metastasis from renal cell carcinoma. Consider tissue sampling of the right middle lobe dominant nodule. 2. Otherwise, no evidence of hypermetabolic metastatic disease after right nephrectomy. 3. Incidental findings, including coronary artery atherosclerosis, left nephrolithiasis, and prostatomegaly. 4. Decreased sensitivity and specificity exam due to technique related factors, as  described above. Electronically Signed By: Abigail Miyamoto M.D. On: 06/06/2015 09:11    ASSESSMENT & PLAN:  65 year old Caucasian male with  #1 Remote history of metastatic renal cell carcinoma status post right-sided nephrectomy with multiple recurrences treated with surgical excision as detailed above. He has been NED status for the last 8 years. He stopped following up with the cancer Center due to concerns about medical costs. He has been referred back to [place] by Dr. Sharlett Iles after recent CT of the chest showed multiple lung nodules concerning for neoplastic etiology and was noted to have recurrent metastatic  RCC.  #2 New recurrence of metastatic Renal cell carcinoma with multiple lung metastases. PET/CT positive pulmonary nodule. 2 cm right middle lobe lung nodule appears to be the most prominent and FDG avid. His blood sugars prior to the PET/CT scan were elevated which might reduce the sensitivity of the PET scan.  No overt extrathoracic metastatic disease noted.   CT head unremarkable for evidence of metastatic disease. Lung nodule biopsy confirms -poorly differentiated carcinoma with focal clear cell and focal sarcomatoid features.  Patient is status post 3 months of Sutent treatment. Repeat PET/CT scan shows good response to treatment. The dominant right middle lobe lesion is smaller in size but appears brighter. This could certainly be due to the fact that his initial PET/CT scan was done with blood sugars of nearly 250 and this one was with blood sugars of 145. However given the mixed histology cannot rule out progression of the sarcomatoid component of the tumor.  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 Confirmed by [place].  #4 mild dehydration - resolved #5 mild thrombocytopenia likely related to Sutent- resolved with dose reduction. #6 elevation in bilirubin levels likely due to Sutent. Bilirubin level slightly higher . Will  monitor with labs in 3 weeks and then again in 6 weeks #7 grade 1 fatigue due to Sutent - much improved with dose reduction and changing schedule to 2 week on and 1 week. Plan -Patient is in good spirits and is tolerating this regimen of Sutent much better. -Asymptomatic mild bump in bilirubin which we shall monitor with labs in 3 weeks in 6 weeks. -We discussed the PET/CT scan in details. He appears to have had good response. The degrees in size of the dominant right middle lobe lesion is reassuring. Appears somewhat brighter on the PET/CT scan which could be due to differential blood sugar levels with the studies especially with the first study having been done with high blood sugar levels. However cannot rule out limited progression of the sarcomatoid competent of his tumor. -We will consult radiation oncology to consider SRS on his dominant right middle lobe lesion the logic being -potentially better control of the sarcomatoid component of his disease, good systemic control her treatment overall, might lead to antigen unmasking and better immune response for tumor clearance. -Follow-up with rheumatology to evaluate left hand nodule.   Return to care with Dr. Irene Limbo in 6 weeks with labs and labs only in 3 weeks. Rpt scan 3 months  I spent 25 minutes counseling the patient face to face. The total time spent in the appointment was 25 minutes and more than 50% was on counseling and direct patient cares.    Sullivan Lone MD [place] AAHIVMS [facility] Hematology/Oncology Physician [facility]  (Office):       [phone] (Work cell):  [phone] (Fax):           [phone]

## 2015-10-16 ENCOUNTER — Telehealth: Payer: Self-pay | Admitting: *Deleted

## 2015-10-16 NOTE — Telephone Encounter (Signed)
"  Dr. Christoper Allegra needs my lab results in order to refill my Plaquenil.  Could you send my liver and kidney function information to her?"   Dr. Patrecia Pour added to Care Team list and results routed as requested.

## 2015-10-26 ENCOUNTER — Telehealth: Payer: Self-pay | Admitting: Radiation Oncology

## 2015-10-26 NOTE — Progress Notes (Signed)
Thoracic Location of Tumor / Histology:  RIGHT MIDDLE LUNG LESION , (METASATATIC RENAL CANCER)  Patient presented  months ago with symptoms of:   Biopsies of  (if applicable) revealed: none  Tobacco/Marijuana/Snuff/ETOH use: never smoker, no smokeless tobacco use,occasional beer,no drug use  Past/Anticipated interventions by surgery, if any: Whipple, , left nephrectomy, gastrectomy,thyroidectomy Past/Anticipated interventions by medical oncology, if any:Dr. Irene Limbo, MD patient taking Sutent oral chemotherapy  2 weeks on `1 week off, referral for possible  SBRT right middle lung  Signs/Symptoms  Weight changes, if any:  Respiratory complaints, if any:   Hemoptysis, if any: No  Pain issues, if any:  Right shoulder pain  And knees joint pain   SAFETY ISSUES:  Prior radiation?  NO  Pacemaker/ICD? NO   Is the patient on methotrexate?  NO  Current Complaints / other details:  Married,1 child, Father hodgkins lymphoma,   Allergies:NKA BP 137/66 mmHg  Pulse 45  Temp(Src) 97.7 F (36.5 C) (Oral)  Resp 20  Ht 5' 9.5" (1.765 m)  Wt 188 lb 6.2 oz (85.453 kg)  BMI 27.43 kg/m2  SpO2 100%  Wt Readings from Last 3 Encounters:  10/29/15 188 lb 6.2 oz (85.453 kg)  10/11/15 196 lb 4.8 oz (89.041 kg)  09/06/15 194 lb 12.8 oz (88.361 kg)

## 2015-10-26 NOTE — Telephone Encounter (Signed)
I offered to move up the patient's appt from Wednesday to Monday. He is in agreement and will present at 9am for 930 consult with Dr. Lisbeth Renshaw.

## 2015-10-29 ENCOUNTER — Encounter: Payer: Self-pay | Admitting: Radiation Oncology

## 2015-10-29 ENCOUNTER — Ambulatory Visit
Admission: RE | Admit: 2015-10-29 | Discharge: 2015-10-29 | Disposition: A | Discharge status: Home / Self Care | Payer: Medicare Other | Source: Ambulatory Visit | Attending: Radiation Oncology | Admitting: Radiation Oncology

## 2015-10-29 ENCOUNTER — Encounter: Payer: Self-pay | Admitting: *Deleted

## 2015-10-29 ENCOUNTER — Institutional Professional Consult (permissible substitution): Payer: Medicare Other | Admitting: Radiation Oncology

## 2015-10-29 VITALS — BP 137/66 | HR 45 | Temp 97.7°F | Resp 20 | Ht 69.5 in | Wt 188.4 lb

## 2015-10-29 DIAGNOSIS — C7801 Secondary malignant neoplasm of right lung: Secondary | ICD-10-CM

## 2015-10-29 DIAGNOSIS — Z905 Acquired absence of kidney: Secondary | ICD-10-CM | POA: Diagnosis not present

## 2015-10-29 DIAGNOSIS — N289 Disorder of kidney and ureter, unspecified: Secondary | ICD-10-CM | POA: Insufficient documentation

## 2015-10-29 DIAGNOSIS — Z85528 Personal history of other malignant neoplasm of kidney: Secondary | ICD-10-CM | POA: Insufficient documentation

## 2015-10-29 DIAGNOSIS — Z807 Family history of other malignant neoplasms of lymphoid, hematopoietic and related tissues: Secondary | ICD-10-CM | POA: Insufficient documentation

## 2015-10-29 DIAGNOSIS — E119 Type 2 diabetes mellitus without complications: Secondary | ICD-10-CM | POA: Insufficient documentation

## 2015-10-29 DIAGNOSIS — Z510 Encounter for antineoplastic radiation therapy: Secondary | ICD-10-CM | POA: Insufficient documentation

## 2015-10-29 DIAGNOSIS — E039 Hypothyroidism, unspecified: Secondary | ICD-10-CM | POA: Diagnosis not present

## 2015-10-29 DIAGNOSIS — Z8507 Personal history of malignant neoplasm of pancreas: Secondary | ICD-10-CM | POA: Diagnosis not present

## 2015-10-29 DIAGNOSIS — F329 Major depressive disorder, single episode, unspecified: Secondary | ICD-10-CM | POA: Insufficient documentation

## 2015-10-29 DIAGNOSIS — Z8589 Personal history of malignant neoplasm of other organs and systems: Secondary | ICD-10-CM | POA: Diagnosis not present

## 2015-10-29 DIAGNOSIS — M1990 Unspecified osteoarthritis, unspecified site: Secondary | ICD-10-CM | POA: Insufficient documentation

## 2015-10-29 DIAGNOSIS — I10 Essential (primary) hypertension: Secondary | ICD-10-CM | POA: Insufficient documentation

## 2015-10-29 DIAGNOSIS — F1010 Alcohol abuse, uncomplicated: Secondary | ICD-10-CM | POA: Insufficient documentation

## 2015-10-29 NOTE — Progress Notes (Signed)
Please see the Nurse Progress Note in the MD Initial Consult Encounter for this patient. 

## 2015-10-29 NOTE — Progress Notes (Signed)
[place] Work  Clinical Social Work was referred by Pension scheme manager PA for assessment of psychosocial needs due to financial concerns.  Clinical Social Worker met with patient prior to his appointment at [facility] to offer support and assess for needs.  Pt reports he is "very poor" and is concerned "how much all of this will cost". Pt plans to meet with financial counselor today Pt shared he has many monthly expenses and receives $1600 a month from his social security. This is above the income requirements for public assistance and most other cancer related assistance programs. CSW shared with pt additional resources through the ACS and provided their number. CSW also provided pt with financial counselor's contact information as well. Pt was able to get co-pay assistance for his medications. Pt eager to meet with medical team and CSW can follow up with pt at later date.   Clinical Social Work interventions: Resource education   Loren Racer, [place] Worker [place]  [place] Phone: [phone] Fax: [phone]

## 2015-10-29 NOTE — Progress Notes (Signed)
Radiation Oncology         [phone] ________________________________  Name: Dylan Jefferson MRN: [id number]  Date: 10/29/2015  DOB: [birth year]  [id number], Ronnette Juniper, Cloria Spring, MD     REFERRING PHYSICIAN: Brunetta Genera, MD   DIAGNOSIS: The encounter diagnosis was Malignant neoplasm metastatic to right lung [facility]).   Right middle lung lesion from metastatic renal cell carcinoma.  HISTORY OF PRESENT ILLNESS::Dylan Jefferson is a 65 y.o. male who is seen for an initial consultation visit. He was diagnosed with renal cell carcinoma 20 years ago and had a right nephrectomy. About 8 years post-surgery, he was diagnosed with recurrence in his pancrease, spleen, and small intestine. He then had recurrence in his thyroid and had a thyroidectomy in June 2004 and a partial gastrectomy for local recurrence in February 2005. He mentions that he has not had recurrence over the last 8 years until October 2016, he had a chest x-ray that revealed a right chest mass and CT scan confirmed this followed by a PET scan 06/06/2015. 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 He started oral Sutent, and a repeat PET scan on 10/08/2015 revealed improvement and resolution in some of the nodules, though the lesion in the right middle lobe that had previously been biopsied was now 1.7 cm previously 2.1 cm, however, the SUV is now 10.6 versus 6.1. He comes today for discussion of the role of SBRT for this lesion.  He mentions that he is tired today. He reports pain in his shoulder and pain today. He mentions that he does not have problems breathing. He denies any other pain.    PREVIOUS RADIATION THERAPY: No   PAST MEDICAL HISTORY:  has a past medical history of Arthritis; Kidney disease; Diabetes ([place]); Depression; Hypertension; Hypothyroidism; Cancer ([place]); Rheumatoid  arthritis ([place]); and Nephrolithiasis.     PAST SURGICAL HISTORY: Past Surgical History  Procedure Laterality Date  . Thyroidectomy    . Gastrectomy      tumor removed  . Whipple procedure    . Nephrectomy Right      FAMILY HISTORY: family history includes Bleeding Disorder in his mother; Lymphoma in his father; Osteoporosis in his mother. There is no history of Colon cancer.   SOCIAL HISTORY:  reports that he has never smoked. He has never used smokeless tobacco. He reports that he drinks alcohol. He reports that he does not use illicit drugs.   ALLERGIES: Review of patient's allergies indicates no known allergies.   MEDICATIONS:  Current Outpatient Prescriptions  Medication Sig Dispense Refill  . amLODipine (NORVASC) 10 MG tablet Take 1 tablet (10 mg total) by mouth daily. 30 tablet 6  . Ascorbic Acid (VITAMIN C) 1000 MG tablet Take 1,000 mg by mouth daily.    Marland Kitchen aspirin 81 MG tablet Take 81 mg by mouth daily.    Marland Kitchen atenolol (TENORMIN) 100 MG tablet     . augmented betamethasone dipropionate (DIPROLENE-AF) 0.05 % cream   0  . calcium citrate-vitamin D (CITRACAL+D) 315-200 MG-UNIT per tablet Take 2 tablets by mouth daily.     . Cyanocobalamin (VITAMIN B-12) 2500 MCG SUBL Take by mouth daily.     . famotidine (PEPCID) 20 MG tablet Take 20 mg by mouth 2 (two) times daily.    . ferrous sulfate 325 (65 FE) MG tablet Take 325 mg by mouth 2 (two) times daily with  a meal.     . Glucosamine-MSM-Hyaluronic Acd (JOINT HEALTH) 750-375-30 MG TABS Take 2 tablets by mouth daily.    Marland Kitchen HYDROcodone-acetaminophen (NORCO/VICODIN) 5-325 MG tablet Take 1-2 tablets by mouth every 4 (four) hours as needed for moderate pain.    . hydroxychloroquine (PLAQUENIL) 200 MG tablet   0  . insulin lispro (HUMALOG) 100 UNIT/ML injection Inject into the skin 3 (three) times daily before meals.    . Iron-Vitamins (GERITOL COMPLETE) TABS Take 1 tablet by mouth daily.    . Lactobacillus (ACIDOPHILUS) CAPS capsule  Take 1 capsule by mouth daily.    Marland Kitchen LEVEMIR 100 UNIT/ML injection Inject 7 Units into the skin 2 (two) times daily.   1  . levothyroxine (SYNTHROID, LEVOTHROID) 300 MCG tablet     . lovastatin (MEVACOR) 40 MG tablet     . minoxidil (LONITEN) 10 MG tablet     . Multiple Vitamin (MULTIVITAMIN) tablet Take 1 tablet by mouth daily.    . Omega-3 Fatty Acids (FISH OIL) 1200 MG CAPS Take 1 capsule by mouth daily.    . Pancrelipase, Lip-Prot-Amyl, (CREON) 6000 UNITS CPEP Take 4 capsules by mouth 3 (three) times daily.    . potassium citrate (UROCIT-K) 10 MEQ (1080 MG) SR tablet     . SUNItinib (SUTENT) 37.5 MG capsule Take 1 tablet (37.5 mg) by mouth daily for 2 wks followed by 1 wk rest 28 capsule 1  . traMADol (ULTRAM) 50 MG tablet Take by mouth as needed.    . valsartan-hydrochlorothiazide (DIOVAN-HCT) 160-12.5 MG per tablet      No current facility-administered medications for this encounter.     REVIEW OF SYSTEMS:  A 15 point review of systems is documented in the electronic medical record. This was obtained by the nursing staff. However, I reviewed this with the patient to discuss relevant findings and make appropriate changes.  Pertinent items are noted in HPI.    PHYSICAL EXAM:  height is 5' 9.5" (1.765 m) and weight is 188 lb 6.2 oz (85.453 kg). His oral temperature is 97.7 F (36.5 C). His blood pressure is 137/66 and his pulse is 45. His respiration is 20 and oxygen saturation is 100%.    ECOG = 1  0 - Asymptomatic (Fully active, able to carry on all predisease activities without restriction)  1 - Symptomatic but completely ambulatory (Restricted in physically strenuous activity but ambulatory and able to carry out work of a light or sedentary nature. For example, light housework, office work)  2 - Symptomatic, <50% in bed during the day (Ambulatory and capable of all self care but unable to carry out any work activities. Up and about more than 50% of waking hours)  3 - Symptomatic,  >50% in bed, but not bedbound (Capable of only limited self-care, confined to bed or chair 50% or more of waking hours)  4 - Bedbound (Completely disabled. Cannot carry on any self-care. Totally confined to bed or chair)  5 - Death   Eustace Pen MM, Creech RH, Tormey DC, et al. [phone]). "Toxicity and response criteria of the [facility]". [place] Oncol. 5 (6): 649-55  In general this is a well appearing caucasian male in no acute distress. He is alert and oriented x4 and appropriate throughout the examination. HEENT reveals that the patient is normocephalic, atraumatic. EOMs are intact. PERRLA. Skin is intact without any evidence of gross lesions. Cardiovascular exam reveals a regular rate and rhythm, no clicks rubs or murmurs are  auscultated. Chest is clear to auscultation bilaterally. Lymphatic assessment is performed and does not reveal any adenopathy in the cervical, supraclavicular, axillary, or inguinal chains. Abdomen has active bowel sounds in all quadrants and is intact. The abdomen is soft, non tender, non distended. Lower extremities are negative for pretibial pitting edema, deep calf tenderness, cyanosis or clubbing.    LABORATORY DATA:  Lab Results  Component Value Date   WBC 5.8 10/11/2015   HGB 13.8 10/11/2015   HCT 40.2 10/11/2015   MCV 105.8* 10/11/2015   PLT 159 10/11/2015   Lab Results  Component Value Date   NA 140 10/11/2015   K 4.6 10/11/2015   CL 109 06/13/2015   CO2 29 10/11/2015   Lab Results  Component Value Date   ALT 32 10/11/2015   AST 33 10/11/2015   ALKPHOS 89 10/11/2015   BILITOT 1.93* 10/11/2015      RADIOGRAPHY: Nm Pet Image Restag (ps) Skull Base To Thigh  10/08/2015  CLINICAL DATA:  Subsequent treatment strategy for metastatic renal cell carcinoma with history of lung, thyroid, gastric and pancreatic metastases. EXAM: NUCLEAR MEDICINE PET SKULL BASE TO THIGH TECHNIQUE: 9.5 mCi F-18 FDG was injected intravenously. Full-ring  PET imaging was performed from the skull base to thigh after the radiotracer. CT data was obtained and used for attenuation correction and anatomic localization. FASTING BLOOD GLUCOSE:  Value: 143 mg/dl COMPARISON:  06/06/2015 PET-CT. FINDINGS: NECK No hypermetabolic lymph nodes in the neck. Status post total thyroidectomy. CHEST There is a hypermetabolic 1.7 cm right middle lobe pulmonary nodule (series 8/image 40 closed with max SUV 10.6, previously 2.1 cm with max SUV 6.1, decreased in size and increased in metabolism. Additional previously described bilateral pulmonary nodules have all decreased in size and demonstrate no significant metabolism, for example a 0.4 cm lingular pulmonary nodule (series 8/image 47) is decreased from 0.6 cm and a left lower lobe 0.7 cm pulmonary nodule (series 8/image 69) is decreased from 1.2 cm. No acute consolidative airspace disease or new significant pulmonary nodules. No hypermetabolic axillary, mediastinal or hilar nodes. Coronary atherosclerosis. ABDOMEN/PELVIS No abnormal hypermetabolic activity within the liver or adrenal glands. No hypermetabolic lymph nodes in the abdomen or pelvis. Hypermetabolism in the superficial subcutaneous ventral abdominal wall is presumably due to injection of medications. Stable postsurgical changes from partial gastrectomy with gastrojejunostomy, pancreatectomy, cholecystectomy, splenectomy and choledochojejunostomy with expected pneumobilia. Status post right nephrectomy with no hypermetabolic mass or other abnormal finding in the right nephrectomy bed. Nonobstructing 8 mm stone in the lower left kidney. No left hydronephrosis. Top-normal size prostate. SKELETON No focal hypermetabolic activity to suggest skeletal metastasis. IMPRESSION: 1. Pulmonary metastases have all decreased in size. Dominant hypermetabolic right middle lobe pulmonary metastasis, however, has increased in metabolism. Treatment response is overall mixed. 2. No new sites of  hypermetabolic metastatic disease. 3. Stable extensive postsurgical changes in the neck and abdomen as described. 4. Nonobstructing left renal stone.  No left hydronephrosis. 5. Coronary atherosclerosis. Electronically Signed   By: Ilona Sorrel M.D.   On: 10/08/2015 10:21       IMPRESSION: Dylan Jefferson is a 65 yo male with a right middle lung lesion from metastatic renal cell carcinoma. He is a good candidate for SBRT to the dominant remaining hypermetabolic lesion within the right lung. I discussed with the patient in some detail the results of this PET scan, contrast due to his prior PET scan. We did also discuss the issue of blood glucose levels and how this may  have affected his prior scan. Nevertheless, the residual lesion on his recent tumor is markedly hypermetabolic and the patient does not appear to have any other significant volume disease elsewhere. The patient has some significant financial issues/questions and he has been set up to discuss this with our staff. He is very interested otherwise in proceeding with treatment to this lesion.   PLAN: We discussed the role of radiation and its possible side effects. We discussed that he would need 3 treatments. He understand this treatment is important but would still like to discuss treatment costs before he agrees to treatment. He is meeting with Ailene Ravel today to discuss costs. We will tentatively plan for simulation within the next 1-2 weeks.    ________________________________  Jodelle Gross, MD, PhD    This document serves as a record of services personally performed by Shona Simpson, PAC and Kyung Rudd, MD. It was created on his behalf by Lendon Collar, a trained medical scribe. The creation of this record is based on the scribe's personal observations and the provider's statements to them. This document has been checked and approved by the attending provider.

## 2015-10-31 ENCOUNTER — Ambulatory Visit: Payer: Medicare Other | Admitting: Radiation Oncology

## 2015-10-31 ENCOUNTER — Ambulatory Visit: Payer: Medicare Other

## 2015-11-02 ENCOUNTER — Telehealth: Payer: Self-pay | Admitting: Pharmacist

## 2015-11-02 NOTE — Telephone Encounter (Signed)
Oral Chemotherapy Follow-Up Form  Original Start date of oral chemotherapy: 07/06/15  Called patient today to follow up regarding patient's oral chemotherapy medication: Sutent  Pt is doing well today.  He plans to begin SBRT x 3 txs and has SIM this Monday.  Pt reports the following side effects: Ongoing taste alteration: "I have no taste buds."  Appetite is not really good and has to force himself to eat.  He does best eating canned vegetables and some fruits (avoids grapefruit due to drug-food interaction w/ Sutent).  Dairy tastes "sour" to him. Fatigue.  Not new. Diarrhea: occurs usually at the end of week 2 being "on" Sutent.  He manages these sxs which are short in duration.  Today is the start of week 1 of 2 weeks "on" and then he'll take a week "off." He has to call [facility] for refill today.  He will contact us if there are problems w/ getting the Sutent refilled.   Will follow up and call patient again in 1 month.  Thank you,  Kennith Center, Pharm.D., CPP 11/02/2015@10 :39 AM Oral Chemotherapy Clinic

## 2015-11-05 ENCOUNTER — Ambulatory Visit
Admission: RE | Admit: 2015-11-05 | Discharge: 2015-11-05 | Disposition: A | Discharge status: Home / Self Care | Payer: Medicare Other | Source: Ambulatory Visit | Attending: Radiation Oncology | Admitting: Radiation Oncology

## 2015-11-05 DIAGNOSIS — Z510 Encounter for antineoplastic radiation therapy: Secondary | ICD-10-CM | POA: Diagnosis not present

## 2015-11-05 DIAGNOSIS — C7801 Secondary malignant neoplasm of right lung: Secondary | ICD-10-CM

## 2015-11-06 ENCOUNTER — Telehealth: Payer: Self-pay | Admitting: Hematology

## 2015-11-06 ENCOUNTER — Other Ambulatory Visit: Payer: Self-pay | Admitting: *Deleted

## 2015-11-06 NOTE — Telephone Encounter (Signed)
Pt aware of appt time change for 4/20 visit

## 2015-11-07 ENCOUNTER — Telehealth: Payer: Self-pay | Admitting: *Deleted

## 2015-11-07 DIAGNOSIS — Z510 Encounter for antineoplastic radiation therapy: Secondary | ICD-10-CM | POA: Diagnosis not present

## 2015-11-07 NOTE — Telephone Encounter (Signed)
Spouse Maryjane called asking if fax she just sent to [phone] was received.  Confirmed receipt with collaborative nurse.

## 2015-11-08 ENCOUNTER — Encounter: Payer: Self-pay | Admitting: Hematology

## 2015-11-08 DIAGNOSIS — Z510 Encounter for antineoplastic radiation therapy: Secondary | ICD-10-CM | POA: Diagnosis not present

## 2015-11-08 NOTE — Telephone Encounter (Signed)
Patient's wife Jilda Panda called returning collaborative nurse call.  Per collaborative nurse she called to notify her the FMLA form is awaiting provider completion and will be faxed when completed.  Per Maryjane "please tell her to call me when ready for pick up.  I did not supply a fax number because I want to receive the form."

## 2015-11-08 NOTE — Progress Notes (Signed)
left in pod-wife wants call once done to pick up

## 2015-11-09 ENCOUNTER — Encounter: Payer: Self-pay | Admitting: Hematology

## 2015-11-09 NOTE — Progress Notes (Signed)
fmla form sent to medical records--lashonya made copy and I left at front desk for pick up at next week's visit.

## 2015-11-14 ENCOUNTER — Ambulatory Visit
Admission: RE | Admit: 2015-11-14 | Discharge: 2015-11-14 | Disposition: A | Discharge status: Home / Self Care | Payer: Medicare Other | Source: Ambulatory Visit | Attending: Radiation Oncology | Admitting: Radiation Oncology

## 2015-11-14 ENCOUNTER — Telehealth: Payer: Self-pay

## 2015-11-14 DIAGNOSIS — Z510 Encounter for antineoplastic radiation therapy: Secondary | ICD-10-CM | POA: Diagnosis not present

## 2015-11-14 NOTE — Telephone Encounter (Signed)
Patient's wife to pick up FMLA paperwork this morning.  Confirmed with Wilma.

## 2015-11-19 ENCOUNTER — Ambulatory Visit
Admission: RE | Admit: 2015-11-19 | Discharge: 2015-11-19 | Disposition: A | Discharge status: Home / Self Care | Payer: Medicare Other | Source: Ambulatory Visit | Attending: Radiation Oncology | Admitting: Radiation Oncology

## 2015-11-19 DIAGNOSIS — Z510 Encounter for antineoplastic radiation therapy: Secondary | ICD-10-CM | POA: Diagnosis not present

## 2015-11-20 ENCOUNTER — Ambulatory Visit: Payer: Medicare Other

## 2015-11-21 ENCOUNTER — Ambulatory Visit: Payer: Medicare Other

## 2015-11-22 ENCOUNTER — Other Ambulatory Visit (HOSPITAL_BASED_OUTPATIENT_CLINIC_OR_DEPARTMENT_OTHER): Payer: Medicare Other

## 2015-11-22 ENCOUNTER — Telehealth: Payer: Self-pay | Admitting: Hematology

## 2015-11-22 ENCOUNTER — Ambulatory Visit
Admission: RE | Admit: 2015-11-22 | Discharge: 2015-11-22 | Disposition: A | Discharge status: Home / Self Care | Payer: Medicare Other | Source: Ambulatory Visit | Attending: Radiation Oncology | Admitting: Radiation Oncology

## 2015-11-22 ENCOUNTER — Encounter: Payer: Self-pay | Admitting: Radiation Oncology

## 2015-11-22 ENCOUNTER — Ambulatory Visit (HOSPITAL_BASED_OUTPATIENT_CLINIC_OR_DEPARTMENT_OTHER): Payer: Medicare Other | Admitting: Hematology

## 2015-11-22 ENCOUNTER — Encounter: Payer: Self-pay | Admitting: Hematology

## 2015-11-22 VITALS — BP 117/64 | HR 45 | Temp 98.0°F | Ht 69.5 in | Wt 186.4 lb

## 2015-11-22 VITALS — BP 134/63 | HR 46 | Temp 98.0°F | Resp 18 | Ht 69.5 in | Wt 185.8 lb

## 2015-11-22 DIAGNOSIS — Z510 Encounter for antineoplastic radiation therapy: Secondary | ICD-10-CM | POA: Diagnosis not present

## 2015-11-22 DIAGNOSIS — Z85528 Personal history of other malignant neoplasm of kidney: Secondary | ICD-10-CM | POA: Diagnosis not present

## 2015-11-22 DIAGNOSIS — C649 Malignant neoplasm of unspecified kidney, except renal pelvis: Secondary | ICD-10-CM | POA: Diagnosis not present

## 2015-11-22 DIAGNOSIS — C7801 Secondary malignant neoplasm of right lung: Secondary | ICD-10-CM | POA: Diagnosis not present

## 2015-11-22 DIAGNOSIS — C7900 Secondary malignant neoplasm of unspecified kidney and renal pelvis: Secondary | ICD-10-CM

## 2015-11-22 LAB — CBC & DIFF AND RETIC
BASO%: 0.5 % (ref 0.0–2.0)
BASOS ABS: 0 10*3/uL (ref 0.0–0.1)
EOS ABS: 0.1 10*3/uL (ref 0.0–0.5)
EOS%: 3.8 % (ref 0.0–7.0)
HEMATOCRIT: 37.3 % — AB (ref 38.4–49.9)
HEMOGLOBIN: 13 g/dL (ref 13.0–17.1)
IMMATURE RETIC FRACT: 2.7 % — AB (ref 3.00–10.60)
LYMPH#: 1.7 10*3/uL (ref 0.9–3.3)
LYMPH%: 44.7 % (ref 14.0–49.0)
MCH: 37 pg — ABNORMAL HIGH (ref 27.2–33.4)
MCHC: 34.9 g/dL (ref 32.0–36.0)
MCV: 106.3 fL — ABNORMAL HIGH (ref 79.3–98.0)
MONO#: 0.5 10*3/uL (ref 0.1–0.9)
MONO%: 12.7 % (ref 0.0–14.0)
NEUT#: 1.4 10*3/uL — ABNORMAL LOW (ref 1.5–6.5)
NEUT%: 38.3 % — AB (ref 39.0–75.0)
PLATELETS: 141 10*3/uL (ref 140–400)
RBC: 3.51 10*6/uL — ABNORMAL LOW (ref 4.20–5.82)
RDW: 14.9 % — ABNORMAL HIGH (ref 11.0–14.6)
RETIC CT ABS: 57.92 10*3/uL (ref 34.80–93.90)
Retic %: 1.65 % (ref 0.80–1.80)
WBC: 3.7 10*3/uL — ABNORMAL LOW (ref 4.0–10.3)

## 2015-11-22 LAB — COMPREHENSIVE METABOLIC PANEL
ALBUMIN: 3.5 g/dL (ref 3.5–5.0)
ALT: 26 U/L (ref 0–55)
ANION GAP: 6 meq/L (ref 3–11)
AST: 30 U/L (ref 5–34)
Alkaline Phosphatase: 82 U/L (ref 40–150)
BILIRUBIN TOTAL: 1.66 mg/dL — AB (ref 0.20–1.20)
BUN: 11.6 mg/dL (ref 7.0–26.0)
CALCIUM: 8.8 mg/dL (ref 8.4–10.4)
CO2: 29 mEq/L (ref 22–29)
CREATININE: 1.7 mg/dL — AB (ref 0.7–1.3)
Chloride: 105 mEq/L (ref 98–109)
EGFR: 42 mL/min/{1.73_m2} — ABNORMAL LOW (ref >90)
Glucose: 300 mg/dl — ABNORMAL HIGH (ref 70–140)
Potassium: 4.8 mEq/L (ref 3.5–5.1)
Sodium: 140 mEq/L (ref 136–145)
TOTAL PROTEIN: 5.6 g/dL — AB (ref 6.4–8.3)

## 2015-11-22 LAB — LACTATE DEHYDROGENASE: LDH: 181 U/L (ref 125–245)

## 2015-11-22 NOTE — Progress Notes (Signed)
  Radiation Oncology         (336) [phone] ________________________________  Name: Dylan Jefferson MRN: [id number]  Date: 11/22/2015  DOB: 1951/03/31  Weekly Radiation Therapy Management    ICD-9-CM ICD-10-CM   1. Malignant neoplasm metastatic to right lung (HCC) 197.0 C78.01     Current Dose: 54 Gy     Planned Dose:  54 Gy  Narrative . . . . . . . . The patient presents for the final under treatment assessment. The patient has had some continuation of previously noted symptoms.                                 Set-up films were reviewed.                                 The chart was checked.  Dylan Jefferson is here after his final treatment of radiation to his Right lung. He denies pain. He reports some fatigue. He has some tenderness to his Right Middle Lateral area. He denies any shortness of breath or any other concerns. He reports not eating well and states "food tastes terrible". He was given a follow up appointment to see Dr. Lisbeth Renshaw in one month.   Physical Findings. . . Weight essentially stable.  No significant changes. Impression . . . . . . . The patient tolerated radiation relatively well. Plan . . . . . . . . . . . . Complete radiation today as scheduled, and follow-up in one month with Dr.Moody. The patient was encouraged to call or return to the clinic in the interim for any worsening symptoms.   ________________________________  Sheral Apley Tammi Klippel, M.D.  This document serves as a record of services personally performed by Tyler Pita, MD. It was created on his behalf by Derek Mound, a trained medical scribe. The creation of this record is based on the scribe's personal observations and the provider's statements to them. This document has been checked and approved by the attending provider.

## 2015-11-22 NOTE — Progress Notes (Signed)
Dylan Jefferson is here after his final treatment of radiation to his Right Lung. He denies pain. He reports some fatigue. He has some tenderness to his Right Middle Lateral area. He denies any shortness of breath or any other concerns. He reports not eating well and states "food tastes terrible".  He was given a follow up appointment to see Dr. Lisbeth Renshaw in one month.   BP 117/64 mmHg  Pulse 45  Temp(Src) 98 F (36.7 C)  Ht 5' 9.5" (1.765 m)  Wt 186 lb 6.4 oz (84.55 kg)  BMI 27.14 kg/m2  SpO2 100%   Wt Readings from Last 3 Encounters:  11/22/15 186 lb 6.4 oz (84.55 kg)  11/22/15 185 lb 12.8 oz (84.278 kg)  10/29/15 188 lb 6.2 oz (85.453 kg)

## 2015-11-22 NOTE — Telephone Encounter (Signed)
per pof to sch pt appt-gave pt copy of avs °

## 2015-11-25 NOTE — Progress Notes (Signed)
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 2) Remote history of metastatic renal cell carcinoma Diagnosed with renal cell carcinoma 20 years ago and had a right nephrectomy. About 8 years after that he was noted to have abdominal recurrence in his pancreas spleen and small intestine and had a Whipple's procedure and significant abdominal surgery and notes that 10 out of 17 lymph nodes were positive. He also had his gallbladder removed. Postoperative course was complicated by an internal hemorrhage as per his report. Patient notes 2-3 years after that he had recurrence in his thyroid that led to a thyroidectomy. [June 2004] later he had partial gastrectomy for local recurrence. [February 2005] when he presented with GI bleeding. Patient notes that he has had no known evidence of recurrence over the last 8 years until his recent CT scan showed lung nodules.  Current Treatment: Sutent 37.5 mg po daily for 2 weeks on and 1 weeks off. (completed cycle 3)  SBRT to dominant RML nodule.  Previous treatment Sutent on cycle 1 - 50 mg by mouth daily for 4 weeks on and 2-weeks off. He was dose adjusted to 37.5 mg by mouth daily for 2 weeks with 1 week of to help mitigate issues with fatigue, mild hyperbilirubinemia, cytopenias.  INTERVAL HISTORY:  Mr Kulas is here for for his scheduled follow-up after having completed his second cycle of Sutent. Notes that he has tolerated this dose and schedule much better. No acute new concerns. Some grade 1 fatigue. Notes a little bit of dysguesia but that he is eating well. Notes the chronic noted on his left hand which she feels might be slightly larger in size. He notes that he would like to have  his rheumatologist evaluate this next month at his appointment since he "gets X rays with them anyways". We discussed the PET/CT scan results. He is okay with me giving him a referral to evaluate whether radiation oncologist to consider SRS to his dominant right middle lobe lung lesion. No other acute concerns. He is in good spirits.  REVIEW OF SYSTEMS:    10 Point review of systems of done and is negative except as noted above.  . Past Medical History  Diagnosis Date  . Arthritis   . Kidney disease   . Diabetes ([place])   . Depression   . Hypertension   . Hypothyroidism   . Cancer [facility])     Renal cell  . Rheumatoid arthritis ([place])   . Nephrolithiasis     . Past Surgical History  Procedure Laterality Date  . Thyroidectomy    . Gastrectomy      tumor removed  . Whipple procedure    . Nephrectomy Right     . Social History  Substance Use Topics  . Smoking status: Never Smoker   . Smokeless tobacco: Never Used  . Alcohol Use: Yes     Comment: occasional beer    ALLERGIES:  has No Known Allergies.  MEDICATIONS:  Current Outpatient Prescriptions  Medication Sig Dispense Refill  . amLODipine (NORVASC) 10 MG tablet Take 1 tablet (10 mg total) by mouth daily. 30 tablet 6  . Ascorbic Acid (VITAMIN C) 1000 MG tablet Take 1,000 mg by mouth daily.    Marland Kitchen aspirin 81 MG tablet Take 81 mg by  mouth daily.    Marland Kitchen atenolol (TENORMIN) 100 MG tablet     . augmented betamethasone dipropionate (DIPROLENE-AF) 0.05 % cream   0  . calcium citrate-vitamin D (CITRACAL+D) 315-200 MG-UNIT per tablet Take 2 tablets by mouth daily.     . Cyanocobalamin (VITAMIN B-12) 2500 MCG SUBL Take by mouth daily.     . dorzolamide-timolol (COSOPT) 22.3-6.8 MG/ML ophthalmic solution place 1 drop into affected eye twice a day  1  . famotidine (PEPCID) 20 MG tablet Take 20 mg by mouth 2 (two) times daily.    . ferrous sulfate 325 (65 FE) MG tablet Take 325 mg by mouth 2 (two) times daily with a meal.     .  Glucosamine-MSM-Hyaluronic Acd (JOINT HEALTH) 750-375-30 MG TABS Take 2 tablets by mouth daily.    Marland Kitchen HYDROcodone-acetaminophen (NORCO/VICODIN) 5-325 MG tablet Take 1-2 tablets by mouth every 4 (four) hours as needed for moderate pain.    . hydroxychloroquine (PLAQUENIL) 200 MG tablet   0  . insulin lispro (HUMALOG) 100 UNIT/ML injection Inject into the skin 3 (three) times daily before meals.    . Iron-Vitamins (GERITOL COMPLETE) TABS Take 1 tablet by mouth daily.    . Lactobacillus (ACIDOPHILUS) CAPS capsule Take 1 capsule by mouth daily.    Marland Kitchen latanoprost (XALATAN) 0.005 % ophthalmic solution place 1 drop into both eyes every evening  1  . LEVEMIR 100 UNIT/ML injection Inject 7 Units into the skin 2 (two) times daily.   1  . levothyroxine (SYNTHROID, LEVOTHROID) 300 MCG tablet     . lovastatin (MEVACOR) 40 MG tablet     . minoxidil (LONITEN) 10 MG tablet     . Multiple Vitamin (MULTIVITAMIN) tablet Take 1 tablet by mouth daily.    . Omega-3 Fatty Acids (FISH OIL) 1200 MG CAPS Take 1 capsule by mouth daily.    . Pancrelipase, Lip-Prot-Amyl, (CREON) 6000 UNITS CPEP Take 4 capsules by mouth 3 (three) times daily.    . potassium citrate (UROCIT-K) 10 MEQ (1080 MG) SR tablet     . SUNItinib (SUTENT) 37.5 MG capsule Take 1 tablet (37.5 mg) by mouth daily for 2 wks followed by 1 wk rest 28 capsule 1  . traMADol (ULTRAM) 50 MG tablet Take by mouth as needed.    . valsartan-hydrochlorothiazide (DIOVAN-HCT) 160-12.5 MG per tablet      No current facility-administered medications for this visit.    PHYSICAL EXAMINATION: ECOG PERFORMANCE STATUS: 1 - Symptomatic but completely ambulatory  . Filed Vitals:   11/22/15 1126  BP: 134/63  Pulse: 46  Temp: 98 F (36.7 C)  Resp: 18    Filed Weights   11/22/15 1126  Weight: 185 lb 12.8 oz (84.278 kg)   .Body mass index is 27.05 kg/(m^2).  GENERAL:alert, in no acute distress and comfortable SKIN: skin color, texture, turgor are normal, no rashes  or significant lesions EYES: normal, conjunctiva are pink and non-injected, sclera clear OROPHARYNX:no exudate, no erythema and lips, buccal mucosa, and tongue normal  NECK: supple, no JVD, thyroid normal size, non-tender, without nodularity LYMPH:  no palpable lymphadenopathy in the cervical, axillary or inguinal LUNGS: clear to auscultation with normal respiratory effort HEART: regular rate & rhythm,  no murmurs and no lower extremity edema ABDOMEN: abdomen soft, non-tender, normoactive bowel sounds  Musculoskeletal: no cyanosis of digits and no clubbing  PSYCH: alert & oriented x 3 with fluent speech NEURO: no focal motor/sensory deficits  LABORATORY DATA:   I have reviewed the data  as listed  . CBC Latest Ref Rng 11/22/2015 10/11/2015 09/06/2015  WBC 4.0 - 10.3 10e3/uL 3.7(L) 5.8 5.3  Hemoglobin 13.0 - 17.1 g/dL 13.0 13.8 14.3  Hematocrit 38.4 - 49.9 % 37.3(L) 40.2 40.4  Platelets 140 - 400 10e3/uL 141 159 246   . CBC    Component Value Date/Time   WBC 3.7* 11/22/2015 1112   WBC 7.3 06/13/2015 0915   RBC 3.51* 11/22/2015 1112   RBC 4.36 06/13/2015 0915   HGB 13.0 11/22/2015 1112   HGB 14.8 06/13/2015 0915   HCT 37.3* 11/22/2015 1112   HCT 43.1 06/13/2015 0915   PLT 141 11/22/2015 1112   PLT 214 06/13/2015 0915   MCV 106.3* 11/22/2015 1112   MCV 98.9 06/13/2015 0915   MCH 37.0* 11/22/2015 1112   MCH 33.9 06/13/2015 0915   MCHC 34.9 11/22/2015 1112   MCHC 34.3 06/13/2015 0915   RDW 14.9* 11/22/2015 1112   RDW 12.8 06/13/2015 0915   LYMPHSABS 1.7 11/22/2015 1112   LYMPHSABS 2.0 06/13/2015 0915   MONOABS 0.5 11/22/2015 1112   MONOABS 0.9 06/13/2015 0915   EOSABS 0.1 11/22/2015 1112   EOSABS 0.2 06/13/2015 0915   BASOSABS 0.0 11/22/2015 1112   BASOSABS 0.1 06/13/2015 0915      . CMP Latest Ref Rng 11/22/2015 10/11/2015 09/06/2015  Glucose 70 - 140 mg/dl 300(H) 167(H) 228(H)  BUN 7.0 - 26.0 mg/dL 11.6 5.4(L) 11.7  Creatinine 0.7 - 1.3 mg/dL 1.7(H) 1.5(H) 1.4(H)    Sodium 136 - 145 mEq/L 140 140 138  Potassium 3.5 - 5.1 mEq/L 4.8 4.6 4.1  CO2 22 - 29 mEq/L 29 29 23   Calcium 8.4 - 10.4 mg/dL 8.8 8.9 8.7  Total Protein 6.4 - 8.3 g/dL 5.6(L) 6.1(L) 6.1(L)  Total Bilirubin 0.20 - 1.20 mg/dL 1.66(H) 1.93(H) 1.35(H)  Alkaline Phos 40 - 150 U/L 82 89 94  AST 5 - 34 U/L 30 33 32  ALT 0 - 55 U/L 26 32 45      RADIOGRAPHIC STUDIES: I have personally reviewed the radiological images as listed and agreed with the findings in the report. No results found.   NUCLEAR MEDICINE PET SKULL BASE TO THIGH 10/08/2015  TECHNIQUE: 9.5 mCi F-18 FDG was injected intravenously. Full-ring PET imaging was performed from the skull base to thigh after the radiotracer. CT data was obtained and used for attenuation correction and anatomic localization.  FASTING BLOOD GLUCOSE: Value: 143 mg/dl  COMPARISON: 06/06/2015 PET-CT.  FINDINGS: NECK  No hypermetabolic lymph nodes in the neck. Status post total thyroidectomy.  CHEST  There is a hypermetabolic 1.7 cm right middle lobe pulmonary nodule (series 8/image 40 closed with max SUV 10.6, previously 2.1 cm with max SUV 6.1, decreased in size and increased in metabolism. Additional previously described bilateral pulmonary nodules have all decreased in size and demonstrate no significant metabolism, for example a 0.4 cm lingular pulmonary nodule (series 8/image 47) is decreased from 0.6 cm and a left lower lobe 0.7 cm pulmonary nodule (series 8/image 69) is decreased from 1.2 cm. No acute consolidative airspace disease or new significant pulmonary nodules.  No hypermetabolic axillary, mediastinal or hilar nodes. Coronary atherosclerosis.  ABDOMEN/PELVIS  No abnormal hypermetabolic activity within the liver or adrenal glands. No hypermetabolic lymph nodes in the abdomen or pelvis. Hypermetabolism in the superficial subcutaneous ventral abdominal wall is presumably due to injection of medications.  Stable postsurgical changes from partial gastrectomy with gastrojejunostomy, pancreatectomy, cholecystectomy, splenectomy and choledochojejunostomy with expected pneumobilia. Status post right nephrectomy  with no hypermetabolic mass or other abnormal finding in the right nephrectomy bed. Nonobstructing 8 mm stone in the lower left kidney. No left hydronephrosis. Top-normal size prostate.  SKELETON  No focal hypermetabolic activity to suggest skeletal metastasis.  IMPRESSION: 1. Pulmonary metastases have all decreased in size. Dominant hypermetabolic right middle lobe pulmonary metastasis, however, has increased in metabolism. Treatment response is overall mixed. 2. No new sites of hypermetabolic metastatic disease. 3. Stable extensive postsurgical changes in the neck and abdomen as described. 4. Nonobstructing left renal stone. No left hydronephrosis. 5. Coronary atherosclerosis.   ASSESSMENT & PLAN:  65 year old Caucasian male with  #1 Remote history of metastatic renal cell carcinoma status post right-sided nephrectomy with multiple recurrences treated with surgical excision as detailed above. He has been NED status for the last 8 years. He stopped following up with the cancer Center due to concerns about medical costs. He has been referred back to [place] by Dr. Sharlett Iles after recent CT of the chest showed multiple lung nodules concerning for neoplastic etiology and was noted to have recurrent metastatic RCC.  #2 New recurrence of metastatic Renal cell carcinoma with multiple lung metastases. PET/CT positive pulmonary nodule. 2 cm right middle lobe lung nodule appears to be the most prominent and FDG avid. His blood sugars prior to the PET/CT scan were elevated which might reduce the sensitivity of the PET scan.  No overt extrathoracic metastatic disease noted.  CT head unremarkable for evidence of metastatic disease. Lung nodule biopsy confirms -poorly differentiated carcinoma  with focal clear cell and focal sarcomatoid features.  Patient is status post 4.5 months of Sutent treatment. Repeat PET/CT scan shows good response to treatment. The dominant right middle lobe lesion is smaller in size but appears brighter. This could certainly be due to the fact that his initial PET/CT scan was done with blood sugars of nearly 250 and this one was with blood sugars of 145. However given the mixed histology cannot rule out progression of the sarcomatoid component of the tumor.   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 Confirmed by [place].  #4 mild dehydration - resolved #5 mild thrombocytopenia likely related to Sutent- resolved with dose reduction. #6 elevation in bilirubin levels likely due to Sutent. Bilirubin stable .  #7 grade 1 fatigue due to Sutent - much improved with dose reduction and changing schedule to 2 week on and 1 week. #8 Dysguesia from Sutent - manageable per patient. He is trying to eat as well as her can #9 chronic kidney disease/single kidney -creatinine creeping up slowly. Patient has poorly controlled labile diabetes. #10 rheumatoid arthritis -has rheumatology follow-up Plan -Patient is completing the third and final dose of SBR T to his right middle lobe metastatic renal cell carcinoma nodule to try to get rid of the sarcomatoid competent of the disease. -Continue current dose of Sutent with close monitoring of labs. -Repeat PET/CT scan 6 weeks to reassess overall disease control. The patient continues to have significant issues with taste or other treatment intolerance might need to consider switching to IV Nivolumab for continued treatment -Appreciate radiation oncology input. -Follow-up with rheumatology to evaluate left hand nodule.  -Continue follow-up with primary care physician - might need to adjust on the atenolol given his heart rate. Blood pressure currently stable. No dizziness or  lightheadedness. Only monitoring of the setting of increasing creatinine.  Return to care with Dr. Irene Limbo in 6 weeks with labs andrepeat PET/CT scan  I spent 25 minutes counseling the patient face to face. The total time spent in the appointment was 25 minutes and more than 50% was on counseling and direct patient cares.    Sullivan Lone MD [place] AAHIVMS [facility] Hematology/Oncology Physician [facility]  (Office):       [phone] (Work cell):  [phone] (Fax):           [phone]

## 2015-11-26 ENCOUNTER — Encounter: Payer: Self-pay | Admitting: Radiation Oncology

## 2015-11-26 NOTE — Progress Notes (Signed)
°  Radiation Oncology         [phone] ________________________________  Name: Dylan Jefferson MRN: [id number]  Date: 11/26/2015  DOB: 17-Sep-1950  End of Treatment Note  Diagnosis:   Right middle lung lesion from metastatic renal cell carcinoma.     Indication for treatment:  Curative       Radiation treatment dates:   11/14/2015 to 11/22/2015  Site/dose:   The Right lung was treated to 54 Gy in 3 fractions at 18 Gy per fraction.  Beams/energy:   SBRT/SRT-3D / 6FFF  Narrative: The patient tolerated radiation treatment relatively well.  The patient experienced mild fatigue, taste change, decrease in appetite, and some tenderness in the right middle lateral area.    Plan: The patient has completed radiation treatment. The patient will return to radiation oncology clinic for routine followup in one month. I advised them to call or return sooner if they have any questions or concerns related to their recovery or treatment.  ------------------------------------------------  Jodelle Gross, MD, PhD  This document serves as a record of services personally performed by Kyung Rudd, MD. It was created on his behalf by Arlyce Harman, a trained medical scribe. The creation of this record is based on the scribe's personal observations and the provider's statements to them. This document has been checked and approved by the attending provider.

## 2015-11-30 ENCOUNTER — Encounter: Payer: Self-pay | Admitting: Pharmacist

## 2015-11-30 NOTE — Progress Notes (Signed)
Oral Chemotherapy Follow-Up Form  Original Start date of oral chemotherapy: _12/2/16__   Called patient today to follow up regarding patient's oral chemotherapy medication: _Sutent_  Pt is doing well today. Energy levels are improved with lower dose of Sutent (37.5 mg 2 weeks on, 1 week off). Still having issues with his taste buds causing foods to taste strange. Dr. Irene Limbo has recommended he drink milk with protein powder to help maintain his weight. Mr. Bouman is doing this. Radiation is complete. He will have a PET scan at the end of May. He is on his second week of Sutent for this cycle and will be on his "off week" starting 12/06/15  Pt reports __0__ tablets missed in the last month.    Pt reports the following side effects: _taste bud changes____    Will follow up and call patient again in __4 weeks____   Thank you,  Montel Clock, PharmD, [facility]

## 2015-12-17 ENCOUNTER — Telehealth: Payer: Self-pay | Admitting: Pharmacist

## 2015-12-17 ENCOUNTER — Other Ambulatory Visit: Payer: Self-pay | Admitting: *Deleted

## 2015-12-17 DIAGNOSIS — C649 Malignant neoplasm of unspecified kidney, except renal pelvis: Secondary | ICD-10-CM

## 2015-12-17 MED ORDER — SUNITINIB MALATE 37.5 MG PO CAPS
ORAL_CAPSULE | ORAL | Status: DC
Start: 2015-12-17 — End: 2016-02-12

## 2015-12-17 NOTE — Telephone Encounter (Signed)
Received call from [employer] pt assistance.  Pt needs refill of Sutent 37.5 mg caps. Message to [place], Therapist, sports.  She will have Dr. Irene Limbo refill this and fax Rx to [facility] (fax# [phone]; ph# [phone]) Need to include pt name, DOB, address on cover sheet. Kennith Center, Pharm.D., CPP 12/17/2015@4 :13 PM

## 2015-12-21 ENCOUNTER — Encounter: Payer: Self-pay | Admitting: Hematology

## 2015-12-21 NOTE — Progress Notes (Signed)
[employer] shipped sutent 12/20/15 to patient.

## 2015-12-24 ENCOUNTER — Other Ambulatory Visit: Payer: Medicare Other

## 2015-12-27 ENCOUNTER — Ambulatory Visit (HOSPITAL_COMMUNITY)
Admission: RE | Admit: 2015-12-27 | Discharge: 2015-12-27 | Disposition: A | Discharge status: Home / Self Care | Payer: Medicare Other | Source: Ambulatory Visit | Attending: Hematology | Admitting: Hematology

## 2015-12-27 ENCOUNTER — Other Ambulatory Visit: Payer: Self-pay | Admitting: Hematology

## 2015-12-27 DIAGNOSIS — C7801 Secondary malignant neoplasm of right lung: Secondary | ICD-10-CM | POA: Diagnosis present

## 2015-12-27 DIAGNOSIS — C7900 Secondary malignant neoplasm of unspecified kidney and renal pelvis: Secondary | ICD-10-CM

## 2015-12-27 DIAGNOSIS — R911 Solitary pulmonary nodule: Secondary | ICD-10-CM | POA: Insufficient documentation

## 2015-12-27 LAB — GLUCOSE, CAPILLARY: Glucose-Capillary: 139 mg/dL — ABNORMAL HIGH (ref 65–99)

## 2015-12-27 MED ORDER — FLUDEOXYGLUCOSE F - 18 (FDG) INJECTION
9.2800 | Freq: Once | INTRAVENOUS | Status: AC | PRN
  Administered 2015-12-27: 9.28 via INTRAVENOUS

## 2016-01-01 ENCOUNTER — Encounter: Payer: Self-pay | Admitting: Radiation Oncology

## 2016-01-01 ENCOUNTER — Telehealth: Payer: Self-pay | Admitting: Hematology

## 2016-01-01 ENCOUNTER — Ambulatory Visit (HOSPITAL_BASED_OUTPATIENT_CLINIC_OR_DEPARTMENT_OTHER): Payer: Medicare Other | Admitting: Hematology

## 2016-01-01 ENCOUNTER — Encounter: Payer: Self-pay | Admitting: Hematology

## 2016-01-01 ENCOUNTER — Other Ambulatory Visit (HOSPITAL_BASED_OUTPATIENT_CLINIC_OR_DEPARTMENT_OTHER): Payer: Medicare Other

## 2016-01-01 ENCOUNTER — Ambulatory Visit
Admission: RE | Admit: 2016-01-01 | Discharge: 2016-01-01 | Disposition: A | Discharge status: Home / Self Care | Payer: Medicare Other | Source: Ambulatory Visit | Attending: Radiation Oncology | Admitting: Radiation Oncology

## 2016-01-01 VITALS — BP 123/63 | HR 49 | Temp 97.5°F | Resp 20 | Ht 69.5 in | Wt 189.8 lb

## 2016-01-01 VITALS — BP 132/60 | HR 53 | Temp 98.0°F | Resp 18 | Ht 69.5 in | Wt 189.7 lb

## 2016-01-01 DIAGNOSIS — C7801 Secondary malignant neoplasm of right lung: Secondary | ICD-10-CM | POA: Diagnosis not present

## 2016-01-01 DIAGNOSIS — C641 Malignant neoplasm of right kidney, except renal pelvis: Secondary | ICD-10-CM

## 2016-01-01 DIAGNOSIS — C7900 Secondary malignant neoplasm of unspecified kidney and renal pelvis: Secondary | ICD-10-CM | POA: Diagnosis not present

## 2016-01-01 DIAGNOSIS — N289 Disorder of kidney and ureter, unspecified: Secondary | ICD-10-CM

## 2016-01-01 DIAGNOSIS — M069 Rheumatoid arthritis, unspecified: Secondary | ICD-10-CM | POA: Diagnosis not present

## 2016-01-01 LAB — CBC & DIFF AND RETIC
BASO%: 0.2 % (ref 0.0–2.0)
BASOS ABS: 0 10*3/uL (ref 0.0–0.1)
EOS%: 3.9 % (ref 0.0–7.0)
Eosinophils Absolute: 0.2 10*3/uL (ref 0.0–0.5)
HEMATOCRIT: 37.5 % — AB (ref 38.4–49.9)
HGB: 13.1 g/dL (ref 13.0–17.1)
IMMATURE RETIC FRACT: 5.3 % (ref 3.00–10.60)
LYMPH#: 1.6 10*3/uL (ref 0.9–3.3)
LYMPH%: 39 % (ref 14.0–49.0)
MCH: 37.8 pg — ABNORMAL HIGH (ref 27.2–33.4)
MCHC: 34.9 g/dL (ref 32.0–36.0)
MCV: 108.1 fL — ABNORMAL HIGH (ref 79.3–98.0)
MONO#: 0.5 10*3/uL (ref 0.1–0.9)
MONO%: 11.5 % (ref 0.0–14.0)
NEUT#: 1.9 10*3/uL (ref 1.5–6.5)
NEUT%: 45.4 % (ref 39.0–75.0)
PLATELETS: 159 10*3/uL (ref 140–400)
RBC: 3.47 10*6/uL — AB (ref 4.20–5.82)
RDW: 14.1 % (ref 11.0–14.6)
RETIC %: 2.53 % — AB (ref 0.80–1.80)
RETIC CT ABS: 87.79 10*3/uL (ref 34.80–93.90)
WBC: 4.1 10*3/uL (ref 4.0–10.3)

## 2016-01-01 LAB — COMPREHENSIVE METABOLIC PANEL
ALT: 28 U/L (ref 0–55)
AST: 26 U/L (ref 5–34)
Albumin: 3.4 g/dL — ABNORMAL LOW (ref 3.5–5.0)
Alkaline Phosphatase: 90 U/L (ref 40–150)
Anion Gap: 7 mEq/L (ref 3–11)
BILIRUBIN TOTAL: 1.88 mg/dL — AB (ref 0.20–1.20)
BUN: 9.7 mg/dL (ref 7.0–26.0)
CALCIUM: 8.4 mg/dL (ref 8.4–10.4)
CHLORIDE: 108 meq/L (ref 98–109)
CO2: 24 meq/L (ref 22–29)
CREATININE: 1.5 mg/dL — AB (ref 0.7–1.3)
EGFR: 49 mL/min/{1.73_m2} — ABNORMAL LOW (ref >90)
GLUCOSE: 263 mg/dL — AB (ref 70–140)
Potassium: 4.4 mEq/L (ref 3.5–5.1)
Sodium: 139 mEq/L (ref 136–145)
TOTAL PROTEIN: 5.4 g/dL — AB (ref 6.4–8.3)

## 2016-01-01 LAB — LACTATE DEHYDROGENASE: LDH: 194 U/L (ref 125–245)

## 2016-01-01 NOTE — Progress Notes (Signed)
Dylan Jefferson here for results of his PET scan.  VSS. Denies any pain nor fatigue.  BP 123/63 mmHg  Pulse 49  Temp(Src) 97.5 F (36.4 C)  Resp 20  Ht 5' 9.5" (1.765 m)  Wt 189 lb 12.8 oz (86.093 kg)  BMI 27.64 kg/m2  SpO2 100%   Wt Readings from Last 3 Encounters:  01/01/16 189 lb 12.8 oz (86.093 kg)  01/01/16 189 lb 11.2 oz (86.047 kg)  11/22/15 186 lb 6.4 oz (84.55 kg)

## 2016-01-01 NOTE — Telephone Encounter (Signed)
Gave and printed appt sched and avs for pt for July  °

## 2016-01-01 NOTE — Progress Notes (Signed)
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 2) Remote history of metastatic renal cell carcinoma Diagnosed with renal cell carcinoma 20 years ago and had a right nephrectomy. About 8 years after that he was noted to have abdominal recurrence in his pancreas spleen and small intestine and had a Whipple's procedure and significant abdominal surgery and notes that 10 out of 17 lymph nodes were positive. He also had his gallbladder removed. Postoperative course was complicated by an internal hemorrhage as per his report. Patient notes 2-3 years after that he had recurrence in his thyroid that led to a thyroidectomy. [June 2004] later he had partial gastrectomy for local recurrence. [February 2005] when he presented with GI bleeding. Patient notes that he has had no known evidence of recurrence over the last 8 years until his recent CT scan showed lung nodules.  Current Treatment: Sutent 37.5 mg po daily for 2 weeks on and 1 weeks off. S/p 4 cycles  SBRT to dominant RML nodule.  Previous treatment Sutent on cycle 1 - 50 mg by mouth daily for 4 weeks on and 2-weeks off. He was dose adjusted to 37.5 mg by mouth daily for 2 weeks with 1 week of to help mitigate issues with fatigue, mild hyperbilirubinemia, cytopenias.  INTERVAL HISTORY:  Dylan Jefferson is here for for his scheduled follow-up after having completed his 4th cycle of Sutent. He completed his SBRT uneventfully. Has tolerated the Sutent with minimal fatigue and some change in taste. No significant GI symptoms. No fevers or chills. Hand nodule noted to be a rheumatoid nodule as per rheumatology and started on diclofenac gel which is that to resolution of the nodule. Had a PET CT  scan after completion of 4 cycles of Sutent and show no evidence of disease progression and response in the right lung nodule to radiation therapy. Patient is in good spirits and has no other acute symptoms.  REVIEW OF SYSTEMS:    10 Point review of systems of done and is negative except as noted above.  . Past Medical History  Diagnosis Date  . Arthritis   . Kidney disease   . Diabetes ([place])   . Depression   . Hypertension   . Hypothyroidism   . Cancer [facility])     Renal cell  . Rheumatoid arthritis ([place])   . Nephrolithiasis     . Past Surgical History  Procedure Laterality Date  . Thyroidectomy    . Gastrectomy      tumor removed  . Whipple procedure    . Nephrectomy Right     . Social History  Substance Use Topics  . Smoking status: Never Smoker   . Smokeless tobacco: Never Used  . Alcohol Use: Yes     Comment: occasional beer    ALLERGIES:  has No Known Allergies.  MEDICATIONS:  Current Outpatient Prescriptions  Medication Sig Dispense Refill  . amLODipine (NORVASC) 10 MG tablet Take 1 tablet (10 mg total) by mouth daily. 30 tablet 6  . Ascorbic Acid (VITAMIN C) 1000 MG tablet Take 1,000 mg by mouth daily.    Marland Kitchen aspirin 81 MG tablet Take 81 mg by mouth daily.    Marland Kitchen atenolol (TENORMIN) 100 MG tablet     . augmented betamethasone dipropionate (DIPROLENE-AF) 0.05 % cream  0  . calcium citrate-vitamin D (CITRACAL+D) 315-200 MG-UNIT per tablet Take 2 tablets by mouth daily.     . Cyanocobalamin (VITAMIN B-12) 2500 MCG SUBL Take by mouth daily.     . dorzolamide-timolol (COSOPT) 22.3-6.8 MG/ML ophthalmic solution place 1 drop into affected eye twice a day  1  . famotidine (PEPCID) 20 MG tablet Take 20 mg by mouth 2 (two) times daily.    . ferrous sulfate 325 (65 FE) MG tablet Take 325 mg by mouth 2 (two) times daily with a meal.     . Glucosamine-MSM-Hyaluronic Acd (JOINT HEALTH) 750-375-30 MG TABS Take 2 tablets by mouth daily.    Marland Kitchen HYDROcodone-acetaminophen  (NORCO/VICODIN) 5-325 MG tablet Take 1-2 tablets by mouth every 4 (four) hours as needed for moderate pain.    . hydroxychloroquine (PLAQUENIL) 200 MG tablet   0  . insulin lispro (HUMALOG) 100 UNIT/ML injection Inject into the skin 3 (three) times daily before meals.    . Iron-Vitamins (GERITOL COMPLETE) TABS Take 1 tablet by mouth daily.    . Lactobacillus (ACIDOPHILUS) CAPS capsule Take 1 capsule by mouth daily.    Marland Kitchen latanoprost (XALATAN) 0.005 % ophthalmic solution place 1 drop into both eyes every evening  1  . LEVEMIR 100 UNIT/ML injection Inject 7 Units into the skin 2 (two) times daily.   1  . levothyroxine (SYNTHROID, LEVOTHROID) 300 MCG tablet     . lovastatin (MEVACOR) 40 MG tablet     . minoxidil (LONITEN) 10 MG tablet     . Multiple Vitamin (MULTIVITAMIN) tablet Take 1 tablet by mouth daily.    . Omega-3 Fatty Acids (FISH OIL) 1200 MG CAPS Take 1 capsule by mouth daily.    . Pancrelipase, Lip-Prot-Amyl, (CREON) 6000 UNITS CPEP Take 4 capsules by mouth 3 (three) times daily.    . potassium citrate (UROCIT-K) 10 MEQ (1080 MG) SR tablet     . SUNItinib (SUTENT) 37.5 MG capsule Take 1 tablet (37.5 mg) by mouth daily for 2 wks followed by 1 wk rest 28 capsule 1  . traMADol (ULTRAM) 50 MG tablet Take by mouth as needed.    . valsartan-hydrochlorothiazide (DIOVAN-HCT) 160-12.5 MG per tablet     . VOLTAREN 1 % GEL apply 3 grams to LARGE JOINTS three times a day if needed  0   No current facility-administered medications for this visit.    PHYSICAL EXAMINATION: ECOG PERFORMANCE STATUS: 1 - Symptomatic but completely ambulatory  . Filed Vitals:   01/01/16 0826  BP: 132/60  Pulse: 53  Temp: 98 F (36.7 C)  Resp: 18    Filed Weights   01/01/16 0826  Weight: 189 lb 11.2 oz (86.047 kg)   .Body mass index is 27.62 kg/(m^2).  GENERAL:alert, in no acute distress and comfortable SKIN: skin color, texture, turgor are normal, no rashes or significant lesions EYES: normal,  conjunctiva are pink and non-injected, sclera clear OROPHARYNX:no exudate, no erythema and lips, buccal mucosa, and tongue normal  NECK: supple, no JVD, thyroid normal size, non-tender, without nodularity LYMPH:  no palpable lymphadenopathy in the cervical, axillary or inguinal LUNGS: clear to auscultation with normal respiratory effort HEART: regular rate & rhythm,  no murmurs and no lower extremity edema ABDOMEN: abdomen soft, non-tender, normoactive bowel sounds  Musculoskeletal: no cyanosis of digits and no clubbing  PSYCH: alert & oriented x 3 with fluent speech NEURO: no focal motor/sensory deficits  LABORATORY DATA:   I have reviewed the data as listed  . CBC  Latest Ref Rng 01/01/2016 11/22/2015 10/11/2015  WBC 4.0 - 10.3 10e3/uL 4.1 3.7(L) 5.8  Hemoglobin 13.0 - 17.1 g/dL 13.1 13.0 13.8  Hematocrit 38.4 - 49.9 % 37.5(L) 37.3(L) 40.2  Platelets 140 - 400 10e3/uL 159 141 159   . CBC    Component Value Date/Time   WBC 4.1 01/01/2016 0814   WBC 7.3 06/13/2015 0915   RBC 3.47* 01/01/2016 0814   RBC 4.36 06/13/2015 0915   HGB 13.1 01/01/2016 0814   HGB 14.8 06/13/2015 0915   HCT 37.5* 01/01/2016 0814   HCT 43.1 06/13/2015 0915   PLT 159 01/01/2016 0814   PLT 214 06/13/2015 0915   MCV 108.1* 01/01/2016 0814   MCV 98.9 06/13/2015 0915   MCH 37.8* 01/01/2016 0814   MCH 33.9 06/13/2015 0915   MCHC 34.9 01/01/2016 0814   MCHC 34.3 06/13/2015 0915   RDW 14.1 01/01/2016 0814   RDW 12.8 06/13/2015 0915   LYMPHSABS 1.6 01/01/2016 0814   LYMPHSABS 2.0 06/13/2015 0915   MONOABS 0.5 01/01/2016 0814   MONOABS 0.9 06/13/2015 0915   EOSABS 0.2 01/01/2016 0814   EOSABS 0.2 06/13/2015 0915   BASOSABS 0.0 01/01/2016 0814   BASOSABS 0.1 06/13/2015 0915   . CMP Latest Ref Rng 01/01/2016 11/22/2015 10/11/2015  Glucose 70 - 140 mg/dl 263(H) 300(H) 167(H)  BUN 7.0 - 26.0 mg/dL 9.7 11.6 5.4(L)  Creatinine 0.7 - 1.3 mg/dL 1.5(H) 1.7(H) 1.5(H)  Sodium 136 - 145 mEq/L 139 140 140  Potassium  3.5 - 5.1 mEq/L 4.4 4.8 4.6  CO2 22 - 29 mEq/L 24 29 29   Calcium 8.4 - 10.4 mg/dL 8.4 8.8 8.9  Total Protein 6.4 - 8.3 g/dL 5.4(L) 5.6(L) 6.1(L)  Total Bilirubin 0.20 - 1.20 mg/dL 1.88(H) 1.66(H) 1.93(H)  Alkaline Phos 40 - 150 U/L 90 82 89  AST 5 - 34 U/L 26 30 33  ALT 0 - 55 U/L 28 26 32      RADIOGRAPHIC STUDIES: I have personally reviewed the radiological images as listed and agreed with the findings in the report. Nm Pet Image Restag (ps) Skull Base To Thigh  12/27/2015  CLINICAL DATA:  Subsequent treatment strategy for renal cell carcinoma with lung metastasis. EXAM: NUCLEAR MEDICINE PET SKULL BASE TO THIGH TECHNIQUE: 9.3 mCi F-18 FDG was injected intravenously. Full-ring PET imaging was performed from the skull base to thigh after the radiotracer. CT data was obtained and used for attenuation correction and anatomic localization. FASTING BLOOD GLUCOSE:  Value: 139 mg/dl COMPARISON:  PET-CT 10/08/2015.  , 06/06/2015, 02/18/2008 FINDINGS: NECK No hypermetabolic lymph nodes in the neck. CHEST Hypermetabolic nodule in the RIGHT upper lobe with SUV max 6.5. This is reduced in metabolic activity from A999333 on PET-CT of 10/08/2015 and similar to 6.0 on PET-CT of 06/06/2015. The lesion is slightly reduced in size measuring 15 x 13 mm compared to 17 x 15 mm (image 82, series 4). A tiny 2 mm RIGHT upper lobe nodule on image 71 is unchanged. No new pulmonary nodules. No hypermetabolic mediastinal adenopathy. ABDOMEN/PELVIS No abnormal metabolic activity in the liver. Pneumobilia again noted. Patient status post Whipple procedure and RIGHT nephrectomy. No hypermetabolic nodularity in the the nephrectomy bed. There is diffuse metabolic activity associated the bowel favored physiologic. No hypermetabolic abdominal pelvic lymph nodes. There is a subcutaneous nodule in the LEFT abdominal wall just LEFT of the umbilicus measuring [id number] x 2.6 cm with SUV max 5.1. This lesion is not changed and size or metabolic  activity over the  comparison PET-CT exams back to 06/06/2015. SKELETON No focal hypermetabolic activity to suggest skeletal metastasis. IMPRESSION: 1. Mild decrease in size and metabolic activity of RIGHT upper lobe pulmonary nodule. 2. Stable small 2 mm RIGHT upper lobe nodule. 3. No evidence of progressive disease in chest, abdomen or pelvis. 4. Stable hypermetabolic subcutaneous nodule in the LEFT abdominal wall stable from 06/06/2015 but new from PET-CT of 02/18/2008. This may represent an injection granuloma. Electronically Signed   By: Suzy Bouchard M.D.   On: 12/27/2015 10:39     ASSESSMENT & PLAN:  65 year old Caucasian male with  #1 Remote history of metastatic renal cell carcinoma status post right-sided nephrectomy with multiple recurrences treated with surgical excision as detailed above. He has been NED status for the last 8 years. He stopped following up with the cancer Center due to concerns about medical costs. He has been referred back to [place] by Dr. Sharlett Iles after recent CT of the chest showed multiple lung nodules concerning for neoplastic etiology and was noted to have recurrent metastatic RCC.  #2 Recurrent metastatic Renal cell carcinoma with multiple lung metastases. Patient is status post Sutent 4 cycles. Has had SBRT to the dominant right lung nodule with partial response. PET/CT scan from 12/27/2015 shows no evidence of disease progression.  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 Confirmed by [place].  #4 mild thrombocytopenia likely related to Sutent- resolved with dose reduction. #5 elevation in bilirubin levels likely due to Sutent. Bilirubin stable .  #6 grade 1 fatigue due to Sutent - much improved with dose reduction and changing schedule to 2 week on and 1 week. #7 Dysguesia from Sutent - manageable per patient. He is trying to eat as well as her can #8 RBC macrocytosis due to Sutent 9 chronic kidney  disease/single kidney -creatinine stable at 1.5. #10 rheumatoid arthritis -follows with Dr. Estanislado Pandy for mx Plan -Patient has no clinical or radiographic evidence of disease progression at this time. PET CT scan was reviewed in detail with the patient. -Continue current dose of Sutent with close monitoring of labs. -We will see him back in 6 weeks for lab monitoring and clinic visit. -Repeat PET/CT scan 12 weeks to reassess overall disease control. (Cannot have CT scan with contrast due to his kidney function and single kidney) -Continue follow-up with primary care physician and rheumatology. Return to care with Dr. Irene Limbo in 6 weeks with labs andrepeat PET/CT scan   Return to care with Dr. Irene Limbo in 6 weeks with CBC, CMP  I spent 20 minutes counseling the patient face to face. The total time spent in the appointment was 25 minutes and more than 50% was on counseling and direct patient cares.    Sullivan Lone MD [place] AAHIVMS [facility] Hematology/Oncology Physician [facility]  (Office):       [phone] (Work cell):  [phone] (Fax):           [phone]

## 2016-01-01 NOTE — Progress Notes (Signed)
Radiation Oncology         [phone] ________________________________  Name: Dylan Jefferson MRN: [id number]  Date: 01/01/2016  DOB: March 08, 1951  Follow-Up Visit Note  CC: PATERSON, Seymour Bars, MD  Diagnosis:  Metastatic Renal Clear Cell and Sarcomatous Carcinoma to the right lung    ICD-9-CM ICD-10-CM   1. Malignant neoplasm metastatic to right lung ([place]) 197.0 C78.01      Interval Since Last Radiation:  6 weeks    11/14/15-11/22/15: SBRT: 54 Gy in 3 fractions to the right upper lobe lesion   Narrative:  The patient returns today for routine follow-up. On 12/27/2015, which showed improvement in the treated lesion in the right upper lobe. The CT From 10.6 in March 2017 to 6.5, and the size was slightly reduced measuring 15 x 13 mm, in comparison to 17 x 15 mm. He also has a tiny 2 mm right upper lobe nodule that is unchanged from previous scan. He met this morning with Dr. Irene Limbo to review this and has plans to be seen again on 02/14/16.   On review of systems, the patient reports that he is doing well overall. He denies any chest pain, shortness of breath, cough, fevers, chills, night sweats, unintended weight changes. He denies any bowel or bladder disturbances, and denies abdominal pain, nausea or vomiting. He denies any new musculoskeletal or joint aches or pains. A complete review of systems is obtained and is otherwise negative.                   ALLERGIES:  has No Known Allergies.  Meds: Current Outpatient Prescriptions  Medication Sig Dispense Refill  . amLODipine (NORVASC) 10 MG tablet Take 1 tablet (10 mg total) by mouth daily. 30 tablet 6  . Ascorbic Acid (VITAMIN C) 1000 MG tablet Take 1,000 mg by mouth daily.    Marland Kitchen aspirin 81 MG tablet Take 81 mg by mouth daily.    Marland Kitchen atenolol (TENORMIN) 100 MG tablet     . augmented betamethasone dipropionate (DIPROLENE-AF) 0.05 % cream   0  . calcium citrate-vitamin D (CITRACAL+D) 315-200 MG-UNIT per tablet Take 2  tablets by mouth daily.     . Cyanocobalamin (VITAMIN B-12) 2500 MCG SUBL Take by mouth daily.     . dorzolamide-timolol (COSOPT) 22.3-6.8 MG/ML ophthalmic solution place 1 drop into affected eye twice a day  1  . famotidine (PEPCID) 20 MG tablet Take 20 mg by mouth 2 (two) times daily.    . ferrous sulfate 325 (65 FE) MG tablet Take 325 mg by mouth 2 (two) times daily with a meal.     . Glucosamine-MSM-Hyaluronic Acd (JOINT HEALTH) 750-375-30 MG TABS Take 2 tablets by mouth daily.    Marland Kitchen HYDROcodone-acetaminophen (NORCO/VICODIN) 5-325 MG tablet Take 1-2 tablets by mouth every 4 (four) hours as needed for moderate pain.    . hydroxychloroquine (PLAQUENIL) 200 MG tablet   0  . insulin lispro (HUMALOG) 100 UNIT/ML injection Inject into the skin 3 (three) times daily before meals.    . Iron-Vitamins (GERITOL COMPLETE) TABS Take 1 tablet by mouth daily.    . Lactobacillus (ACIDOPHILUS) CAPS capsule Take 1 capsule by mouth daily.    Marland Kitchen latanoprost (XALATAN) 0.005 % ophthalmic solution place 1 drop into both eyes every evening  1  . LEVEMIR 100 UNIT/ML injection Inject 7 Units into the skin 2 (two) times daily.   1  . levothyroxine (SYNTHROID, LEVOTHROID) 300 MCG tablet     .  lovastatin (MEVACOR) 40 MG tablet     . minoxidil (LONITEN) 10 MG tablet     . Multiple Vitamin (MULTIVITAMIN) tablet Take 1 tablet by mouth daily.    . Omega-3 Fatty Acids (FISH OIL) 1200 MG CAPS Take 1 capsule by mouth daily.    . Pancrelipase, Lip-Prot-Amyl, (CREON) 6000 UNITS CPEP Take 4 capsules by mouth 3 (three) times daily.    . potassium citrate (UROCIT-K) 10 MEQ (1080 MG) SR tablet     . SUNItinib (SUTENT) 37.5 MG capsule Take 1 tablet (37.5 mg) by mouth daily for 2 wks followed by 1 wk rest 28 capsule 1  . traMADol (ULTRAM) 50 MG tablet Take by mouth as needed.    . valsartan-hydrochlorothiazide (DIOVAN-HCT) 160-12.5 MG per tablet     . VOLTAREN 1 % GEL apply 3 grams to LARGE JOINTS three times a day if needed  0   No  current facility-administered medications for this encounter.    Physical Findings:  height is 5' 9.5" (1.765 m) and weight is 189 lb 12.8 oz (86.093 kg). His temperature is 97.5 F (36.4 C). His blood pressure is 123/63 and his pulse is 49. His respiration is 20 and oxygen saturation is 100%.   In general this is a well appearing Caucasian male in no acute distress. He's alert and oriented x4 and appropriate throughout the examination. Cardiopulmonary assessment is negative for acute distress and he exhibits normal effort.    Lab Findings: Lab Results  Component Value Date   WBC 4.1 01/01/2016   WBC 7.3 06/13/2015   HGB 13.1 01/01/2016   HGB 14.8 06/13/2015   HCT 37.5* 01/01/2016   HCT 43.1 06/13/2015   PLT 159 01/01/2016   PLT 214 06/13/2015    Lab Results  Component Value Date   NA 139 01/01/2016   NA 140 06/13/2015   K 4.4 01/01/2016   K 3.7 06/13/2015   CHLORIDE 108 01/01/2016   CO2 24 01/01/2016   CO2 23 06/13/2015   GLUCOSE 263* 01/01/2016   GLUCOSE 174* 06/13/2015   BUN 9.7 01/01/2016   BUN 13 06/13/2015   CREATININE 1.5* 01/01/2016   CREATININE 1.09 06/13/2015   BILITOT 1.88* 01/01/2016   BILITOT 0.9 10/31/2010   ALKPHOS 90 01/01/2016   ALKPHOS 57 10/31/2010   AST 26 01/01/2016   AST 21 10/31/2010   ALT 28 01/01/2016   ALT 21 10/31/2010   PROT 5.4* 01/01/2016   PROT 5.3* 10/31/2010   ALBUMIN 3.4* 01/01/2016   ALBUMIN 2.8* 10/31/2010   CALCIUM 8.4 01/01/2016   CALCIUM 9.3 06/13/2015   ANIONGAP 7 01/01/2016   ANIONGAP 8 06/13/2015    Radiographic Findings: Nm Pet Image Restag (ps) Skull Base To Thigh  12/27/2015  CLINICAL DATA:  Subsequent treatment strategy for renal cell carcinoma with lung metastasis. EXAM: NUCLEAR MEDICINE PET SKULL BASE TO THIGH TECHNIQUE: 9.3 mCi F-18 FDG was injected intravenously. Full-ring PET imaging was performed from the skull base to thigh after the radiotracer. CT data was obtained and used for attenuation correction and  anatomic localization. FASTING BLOOD GLUCOSE:  Value: 139 mg/dl COMPARISON:  PET-CT 10/08/2015.  , 06/06/2015, 02/18/2008 FINDINGS: NECK No hypermetabolic lymph nodes in the neck. CHEST Hypermetabolic nodule in the RIGHT upper lobe with SUV max 6.5. This is reduced in metabolic activity from 29.9 on PET-CT of 10/08/2015 and similar to 6.0 on PET-CT of 06/06/2015. The lesion is slightly reduced in size measuring 15 x 13 mm compared to 17 x 15 mm (  image 82, series 4). A tiny 2 mm RIGHT upper lobe nodule on image 71 is unchanged. No new pulmonary nodules. No hypermetabolic mediastinal adenopathy. ABDOMEN/PELVIS No abnormal metabolic activity in the liver. Pneumobilia again noted. Patient status post Whipple procedure and RIGHT nephrectomy. No hypermetabolic nodularity in the the nephrectomy bed. There is diffuse metabolic activity associated the bowel favored physiologic. No hypermetabolic abdominal pelvic lymph nodes. There is a subcutaneous nodule in the LEFT abdominal wall just LEFT of the umbilicus measuring 60.1 x 2.6 cm with SUV max 5.1. This lesion is not changed and size or metabolic activity over the comparison PET-CT exams back to 06/06/2015. SKELETON No focal hypermetabolic activity to suggest skeletal metastasis. IMPRESSION: 1. Mild decrease in size and metabolic activity of RIGHT upper lobe pulmonary nodule. 2. Stable small 2 mm RIGHT upper lobe nodule. 3. No evidence of progressive disease in chest, abdomen or pelvis. 4. Stable hypermetabolic subcutaneous nodule in the LEFT abdominal wall stable from 06/06/2015 but new from PET-CT of 02/18/2008. This may represent an injection granuloma. Electronically Signed   By: Suzy Bouchard M.D.   On: 12/27/2015 10:39    Impression/Plan: 1.  Metastatic Renal Clear Cell and Sarcomatous Carcinoma to the right lung. The patient has completed his radiotherapy and has done well in tolerating this. Based on his PET scan, it appears that the treated area is showing  radiographic  Improvement in size and metabolic activity. Patient will follow-up with Dr. Irene Limbo in July and I will touch base with him by phone to ensure that he is doing well. We will plan to see him back in 6 months time to follow-up. He states agreement and understanding.    Carola Rhine, PAC

## 2016-01-02 ENCOUNTER — Ambulatory Visit: Payer: Medicare Other | Admitting: Radiation Oncology

## 2016-01-03 ENCOUNTER — Ambulatory Visit: Payer: Medicare Other | Admitting: Hematology

## 2016-01-03 ENCOUNTER — Other Ambulatory Visit: Payer: Medicare Other

## 2016-01-03 NOTE — Addendum Note (Signed)
Encounter addended by: Benn Moulder, RN on: 01/03/2016 10:56 AM<BR>     Documentation filed: Charges VN

## 2016-02-07 ENCOUNTER — Other Ambulatory Visit: Payer: Self-pay | Admitting: Hematology

## 2016-02-12 ENCOUNTER — Telehealth: Payer: Self-pay

## 2016-02-12 ENCOUNTER — Other Ambulatory Visit: Payer: Self-pay | Admitting: *Deleted

## 2016-02-12 ENCOUNTER — Encounter: Payer: Self-pay | Admitting: *Deleted

## 2016-02-12 DIAGNOSIS — C649 Malignant neoplasm of unspecified kidney, except renal pelvis: Secondary | ICD-10-CM

## 2016-02-12 MED ORDER — SUNITINIB MALATE 37.5 MG PO CAPS: ORAL_CAPSULE | ORAL | Status: DC

## 2016-02-12 NOTE — Telephone Encounter (Signed)
Loretta with [facility] called to ascertain pt is still taking Sutent. She then informed me that he needs a new Rx b/c he is down to 0 refills. Their fax # [phone].

## 2016-02-12 NOTE — Telephone Encounter (Signed)
New prescription sent

## 2016-02-12 NOTE — Progress Notes (Signed)
Faxed prescription to [facility]

## 2016-02-14 ENCOUNTER — Ambulatory Visit (HOSPITAL_BASED_OUTPATIENT_CLINIC_OR_DEPARTMENT_OTHER): Payer: Medicare Other | Admitting: Hematology

## 2016-02-14 ENCOUNTER — Other Ambulatory Visit (HOSPITAL_BASED_OUTPATIENT_CLINIC_OR_DEPARTMENT_OTHER): Payer: Medicare Other

## 2016-02-14 ENCOUNTER — Other Ambulatory Visit: Payer: Self-pay | Admitting: *Deleted

## 2016-02-14 ENCOUNTER — Telehealth: Payer: Self-pay | Admitting: Hematology

## 2016-02-14 ENCOUNTER — Encounter: Payer: Self-pay | Admitting: Hematology

## 2016-02-14 VITALS — BP 141/58 | HR 50 | Temp 97.9°F | Resp 18 | Wt 189.6 lb

## 2016-02-14 DIAGNOSIS — C7900 Secondary malignant neoplasm of unspecified kidney and renal pelvis: Secondary | ICD-10-CM | POA: Diagnosis not present

## 2016-02-14 DIAGNOSIS — C649 Malignant neoplasm of unspecified kidney, except renal pelvis: Secondary | ICD-10-CM

## 2016-02-14 DIAGNOSIS — N189 Chronic kidney disease, unspecified: Secondary | ICD-10-CM

## 2016-02-14 DIAGNOSIS — Z85528 Personal history of other malignant neoplasm of kidney: Secondary | ICD-10-CM

## 2016-02-14 DIAGNOSIS — E890 Postprocedural hypothyroidism: Secondary | ICD-10-CM

## 2016-02-14 DIAGNOSIS — C641 Malignant neoplasm of right kidney, except renal pelvis: Secondary | ICD-10-CM

## 2016-02-14 DIAGNOSIS — I158 Other secondary hypertension: Secondary | ICD-10-CM | POA: Diagnosis not present

## 2016-02-14 LAB — CBC & DIFF AND RETIC
BASO%: 0.2 % (ref 0.0–2.0)
BASOS ABS: 0 10*3/uL (ref 0.0–0.1)
EOS ABS: 0.1 10*3/uL (ref 0.0–0.5)
EOS%: 1.4 % (ref 0.0–7.0)
HEMATOCRIT: 39.4 % (ref 38.4–49.9)
HEMOGLOBIN: 13.7 g/dL (ref 13.0–17.1)
IMMATURE RETIC FRACT: 4.3 % (ref 3.00–10.60)
LYMPH#: 1.4 10*3/uL (ref 0.9–3.3)
LYMPH%: 24.5 % (ref 14.0–49.0)
MCH: 38.7 pg — ABNORMAL HIGH (ref 27.2–33.4)
MCHC: 34.8 g/dL (ref 32.0–36.0)
MCV: 111.3 fL — AB (ref 79.3–98.0)
MONO#: 0.5 10*3/uL (ref 0.1–0.9)
MONO%: 9.2 % (ref 0.0–14.0)
NEUT#: 3.6 10*3/uL (ref 1.5–6.5)
NEUT%: 64.7 % (ref 39.0–75.0)
PLATELETS: 217 10*3/uL (ref 140–400)
RBC: 3.54 10*6/uL — ABNORMAL LOW (ref 4.20–5.82)
RDW: 14 % (ref 11.0–14.6)
RETIC CT ABS: 87.08 10*3/uL (ref 34.80–93.90)
Retic %: 2.46 % — ABNORMAL HIGH (ref 0.80–1.80)
WBC: 5.6 10*3/uL (ref 4.0–10.3)

## 2016-02-14 LAB — COMPREHENSIVE METABOLIC PANEL
ALBUMIN: 3.6 g/dL (ref 3.5–5.0)
ALK PHOS: 105 U/L (ref 40–150)
ALT: 22 U/L (ref 0–55)
ANION GAP: 9 meq/L (ref 3–11)
AST: 22 U/L (ref 5–34)
BILIRUBIN TOTAL: 1.4 mg/dL — AB (ref 0.20–1.20)
BUN: 14.7 mg/dL (ref 7.0–26.0)
CO2: 31 mEq/L — ABNORMAL HIGH (ref 22–29)
CREATININE: 1.5 mg/dL — AB (ref 0.7–1.3)
Calcium: 9.4 mg/dL (ref 8.4–10.4)
Chloride: 105 mEq/L (ref 98–109)
EGFR: 46 mL/min/{1.73_m2} — AB (ref >90)
GLUCOSE: 202 mg/dL — AB (ref 70–140)
Potassium: 4.7 mEq/L (ref 3.5–5.1)
Sodium: 145 mEq/L (ref 136–145)
TOTAL PROTEIN: 6.1 g/dL — AB (ref 6.4–8.3)

## 2016-02-14 MED ORDER — AMLODIPINE BESYLATE 10 MG PO TABS: 10.0000 mg | ORAL_TABLET | Freq: Every day | ORAL | Status: DC

## 2016-02-14 NOTE — Telephone Encounter (Signed)
per pof to sch pt appt-gave pt copy of avs °

## 2016-02-18 ENCOUNTER — Other Ambulatory Visit: Payer: Self-pay | Admitting: Radiation Oncology

## 2016-02-18 DIAGNOSIS — C7900 Secondary malignant neoplasm of unspecified kidney and renal pelvis: Secondary | ICD-10-CM

## 2016-02-18 DIAGNOSIS — C7801 Secondary malignant neoplasm of right lung: Secondary | ICD-10-CM

## 2016-02-20 ENCOUNTER — Encounter: Payer: Self-pay | Admitting: Hematology

## 2016-02-20 NOTE — Progress Notes (Signed)
Per [facility] sutent sent to patient 02/20/16

## 2016-02-21 NOTE — Progress Notes (Signed)
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 2) Remote history of metastatic renal cell carcinoma Diagnosed with renal cell carcinoma 20 years ago and had a right nephrectomy. About 8 years after that he was noted to have abdominal recurrence in his pancreas spleen and small intestine and had a Whipple's procedure and significant abdominal surgery and notes that 10 out of 17 lymph nodes were positive. He also had his gallbladder removed. Postoperative course was complicated by an internal hemorrhage as per his report. Patient notes 2-3 years after that he had recurrence in his thyroid that led to a thyroidectomy. [June 2004] later he had partial gastrectomy for local recurrence. [February 2005] when he presented with GI bleeding. Patient notes that he has had no known evidence of recurrence over the last 8 years until his recent CT scan showed lung nodules.  Current Treatment: Sutent 37.5 mg po daily for 2 weeks on and 1 weeks off. S/p 4 cycles  SBRT to dominant RML nodule.  Previous treatment Sutent on cycle 1 - 50 mg by mouth daily for 4 weeks on and 2-weeks off. He was dose adjusted to 37.5 mg by mouth daily for 2 weeks with 1 week of to help mitigate issues with fatigue, mild hyperbilirubinemia, cytopenias.  INTERVAL HISTORY:  Mr Booras is here for for his scheduled follow-up after having completed his 5th cycle of Sutent. Notes no acute new symptoms.  He still has some grade 1-2 fatigue and grade 2 dysguesia with the Sutent. No significant GI symptoms. No fevers or chills. Breathing has been stable.  No new chest pain.  No new focal neurological deficits or headaches.  No fevers or chills or other signs of  infection. Diabetes and been reasonably controlled.  Continues to be followed by his rheumatologist for his rheumatoid arthritis. Notes that he still continues to enjoy playing chess with the computer.  REVIEW OF SYSTEMS:    10 Point review of systems of done and is negative except as noted above.   Past Medical History  Diagnosis Date  . Arthritis   . Kidney disease   . Diabetes ([place])   . Depression   . Hypertension   . Hypothyroidism   . Cancer [facility])     Renal cell  . Rheumatoid arthritis ([place])   . Nephrolithiasis     . Past Surgical History  Procedure Laterality Date  . Thyroidectomy    . Gastrectomy      tumor removed  . Whipple procedure    . Nephrectomy Right     . Social History  Substance Use Topics  . Smoking status: Never Smoker   . Smokeless tobacco: Never Used  . Alcohol Use: Yes     Comment: occasional beer    ALLERGIES:  has No Known Allergies.  MEDICATIONS:  Current Outpatient Prescriptions  Medication Sig Dispense Refill  . Ascorbic Acid (VITAMIN C) 1000 MG tablet Take 1,000 mg by mouth daily.    Marland Kitchen aspirin 81 MG tablet Take 81 mg by mouth daily.    Marland Kitchen atenolol (TENORMIN) 100 MG tablet     . augmented betamethasone dipropionate (DIPROLENE-AF) 0.05 % cream   0  . calcium citrate-vitamin D (CITRACAL+D) 315-200 MG-UNIT per tablet Take 2 tablets by mouth daily.     . Cyanocobalamin (  VITAMIN B-12) 2500 MCG SUBL Take by mouth daily.     . dorzolamide-timolol (COSOPT) 22.3-6.8 MG/ML ophthalmic solution place 1 drop into affected eye twice a day  1  . famotidine (PEPCID) 20 MG tablet Take 20 mg by mouth 2 (two) times daily.    . ferrous sulfate 325 (65 FE) MG tablet Take 325 mg by mouth 2 (two) times daily with a meal.     . Glucosamine-MSM-Hyaluronic Acd (JOINT HEALTH) 750-375-30 MG TABS Take 2 tablets by mouth daily.    Marland Kitchen HYDROcodone-acetaminophen (NORCO/VICODIN) 5-325 MG tablet Take 1-2 tablets by mouth every 4 (four) hours as needed for moderate  pain.    . hydroxychloroquine (PLAQUENIL) 200 MG tablet   0  . insulin lispro (HUMALOG) 100 UNIT/ML injection Inject into the skin 3 (three) times daily before meals.    . Iron-Vitamins (GERITOL COMPLETE) TABS Take 1 tablet by mouth daily.    . Lactobacillus (ACIDOPHILUS) CAPS capsule Take 1 capsule by mouth daily.    Marland Kitchen latanoprost (XALATAN) 0.005 % ophthalmic solution place 1 drop into both eyes every evening  1  . LEVEMIR 100 UNIT/ML injection Inject 7 Units into the skin 2 (two) times daily.   1  . levothyroxine (SYNTHROID, LEVOTHROID) 300 MCG tablet     . lovastatin (MEVACOR) 40 MG tablet     . minoxidil (LONITEN) 10 MG tablet     . Multiple Vitamin (MULTIVITAMIN) tablet Take 1 tablet by mouth daily.    . Omega-3 Fatty Acids (FISH OIL) 1200 MG CAPS Take 1 capsule by mouth daily.    . Pancrelipase, Lip-Prot-Amyl, (CREON) 6000 UNITS CPEP Take 4 capsules by mouth 3 (three) times daily.    . potassium citrate (UROCIT-K) 10 MEQ (1080 MG) SR tablet     . SUNItinib (SUTENT) 37.5 MG capsule Take 1 tablet (37.5 mg) by mouth daily for 2 wks followed by 1 wk rest 28 capsule 1  . traMADol (ULTRAM) 50 MG tablet Take by mouth as needed.    . valsartan-hydrochlorothiazide (DIOVAN-HCT) 160-12.5 MG per tablet     . VOLTAREN 1 % GEL apply 3 grams to LARGE JOINTS three times a day if needed  0  . amLODipine (NORVASC) 10 MG tablet Take 1 tablet (10 mg total) by mouth daily. 90 tablet 3   No current facility-administered medications for this visit.    PHYSICAL EXAMINATION: ECOG PERFORMANCE STATUS: 1 - Symptomatic but completely ambulatory  . Filed Vitals:   02/14/16 0847  BP: 141/58  Pulse: 50  Temp: 97.9 F (36.6 C)  Resp: 18    Filed Weights   02/14/16 0847  Weight: 189 lb 9.6 oz (86.002 kg)   .Body mass index is 27.61 kg/(m^2).  GENERAL:alert, in no acute distress and comfortable SKIN: skin color, texture, turgor are normal, no rashes or significant lesions EYES: normal, conjunctiva are  pink and non-injected, sclera clear OROPHARYNX:no exudate, no erythema and lips, buccal mucosa, and tongue normal  NECK: supple, no JVD, thyroid normal size, non-tender, without nodularity LYMPH:  no palpable lymphadenopathy in the cervical, axillary or inguinal LUNGS: clear to auscultation with normal respiratory effort HEART: regular rate & rhythm,  no murmurs and no lower extremity edema ABDOMEN: abdomen soft, non-tender, normoactive bowel sounds  Musculoskeletal: no cyanosis of digits and no clubbing  PSYCH: alert & oriented x 3 with fluent speech NEURO: no focal motor/sensory deficits  LABORATORY DATA:   I have reviewed the data as listed  . CBC Latest Ref Rng 02/14/2016  01/01/2016 11/22/2015  WBC 4.0 - 10.3 10e3/uL 5.6 4.1 3.7(L)  Hemoglobin 13.0 - 17.1 g/dL 13.7 13.1 13.0  Hematocrit 38.4 - 49.9 % 39.4 37.5(L) 37.3(L)  Platelets 140 - 400 10e3/uL 217 159 141   . CBC    Component Value Date/Time   WBC 5.6 02/14/2016 0835   WBC 7.3 06/13/2015 0915   RBC 3.54* 02/14/2016 0835   RBC 4.36 06/13/2015 0915   HGB 13.7 02/14/2016 0835   HGB 14.8 06/13/2015 0915   HCT 39.4 02/14/2016 0835   HCT 43.1 06/13/2015 0915   PLT 217 02/14/2016 0835   PLT 214 06/13/2015 0915   MCV 111.3* 02/14/2016 0835   MCV 98.9 06/13/2015 0915   MCH 38.7* 02/14/2016 0835   MCH 33.9 06/13/2015 0915   MCHC 34.8 02/14/2016 0835   MCHC 34.3 06/13/2015 0915   RDW 14.0 02/14/2016 0835   RDW 12.8 06/13/2015 0915   LYMPHSABS 1.4 02/14/2016 0835   LYMPHSABS 2.0 06/13/2015 0915   MONOABS 0.5 02/14/2016 0835   MONOABS 0.9 06/13/2015 0915   EOSABS 0.1 02/14/2016 0835   EOSABS 0.2 06/13/2015 0915   BASOSABS 0.0 02/14/2016 0835   BASOSABS 0.1 06/13/2015 0915   . CMP Latest Ref Rng 02/14/2016 01/01/2016 11/22/2015  Glucose 70 - 140 mg/dl 202(H) 263(H) 300(H)  BUN 7.0 - 26.0 mg/dL 14.7 9.7 11.6  Creatinine 0.7 - 1.3 mg/dL 1.5(H) 1.5(H) 1.7(H)  Sodium 136 - 145 mEq/L 145 139 140  Potassium 3.5 - 5.1 mEq/L  4.7 4.4 4.8  CO2 22 - 29 mEq/L 31(H) 24 29  Calcium 8.4 - 10.4 mg/dL 9.4 8.4 8.8  Total Protein 6.4 - 8.3 g/dL 6.1(L) 5.4(L) 5.6(L)  Total Bilirubin 0.20 - 1.20 mg/dL 1.40(H) 1.88(H) 1.66(H)  Alkaline Phos 40 - 150 U/L 105 90 82  AST 5 - 34 U/L 22 26 30   ALT 0 - 55 U/L 22 28 26       RADIOGRAPHIC STUDIES: I have personally reviewed the radiological images as listed and agreed with the findings in the report. No results found.   ASSESSMENT & PLAN:  65 year old Caucasian male with  #1 Remote history of metastatic renal cell carcinoma status post right-sided nephrectomy with multiple recurrences treated with surgical excision as detailed above. He has been NED status for the last 8 years. He stopped following up with the cancer Center due to concerns about medical costs. He has been referred back to [place] by Dr. Sharlett Iles after recent CT of the chest showed multiple lung nodules concerning for neoplastic etiology and was noted to have recurrent metastatic RCC.  #2 Recurrent metastatic Renal cell carcinoma with multiple lung metastases. Patient is status post Sutent 5 cycles. Has had SBRT to the dominant right lung nodule with partial response. PET/CT scan from 12/27/2015 shows no evidence of disease progression.  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 Confirmed by [place].  #4 mild thrombocytopenia likely related to Sutent- resolved with dose reduction. #5 elevation in bilirubin levels likely due to Sutent. Bilirubin stable .  #6 grade 1-2 fatigue due to Sutent -better with dose reduction and changing schedule to 2 week on and 1 week. #7 Dysguesia from Sutent - manageable per patient. He is trying to eat as well as he can #8 RBC macrocytosis due to Sutent 9 chronic kidney disease/single kidney -creatinine stable at 1.5. #10 rheumatoid arthritis -follows with Dr. Estanislado Pandy for mx Plan -Patient has no clinical evidence of disease  progression at this  time.  -we did a detailed toxicity screen. No prohibitive toxicities of this time that would indicate medication dose changes. -No significant other medication changes since his last clinic visit. -Continue current dose of Sutent with close monitoring of labs. -We will see him back in 6 weeks with rpt labs lab, PET/CT scan and clinic visit. -Repeat PET/CT scan in 6 weeks to reassess overall disease control and plan treatment strategy. (Cannot have CT scan with contrast due to his kidney function and single kidney) -Continue follow-up with primary care physician and rheumatology.   Return to care with Dr. Irene Limbo in 6 weeks with CBC, CMP and PET/CT  I spent 20 minutes counseling the patient face to face. The total time spent in the appointment was 25 minutes and more than 50% was on counseling and direct patient cares.    Sullivan Lone MD [place] AAHIVMS [facility] Hematology/Oncology Physician [facility]  (Office):       [phone] (Work cell):  [phone] (Fax):           [phone]

## 2016-02-23 NOTE — Progress Notes (Signed)
[facility]iation Oncology Simulation and Treatment Planning Note   Name:  @PATNAME @ MRN: [id number]   Date: 02/23/2016  DOB: 12-21-1950  Status:outpatient    DIAGNOSIS: @CURRDX @   CONSENT VERIFIED:yes   SET UP: Patient is setup supine   IMMOBILIZATION: The patient was immobilized using a Vac Loc bag and Abdominal Compression.   NARRATIVE:The patient was brought to the [place].  Identity was confirmed.  All relevant records and images related to the planned course of therapy were reviewed.  Then, the patient was positioned in a stable reproducible clinical set-up for radiation therapy. Abdominal compression was applied by me.  4D CT images were obtained and reproducible breathing pattern was confirmed. Free breathing CT images were obtained.  Skin markings were placed.  The CT images were loaded into the planning software where the target and avoidance structures were contoured.  The radiation prescription was entered and confirmed.    TREATMENT PLANNING NOTE:  Treatment planning then occurred. I have requested : MLC's, isodose plan, basic dose calculation.  3 dimensional simulation is performed and dose volume histogram of the gross tumor volume, planning tumor volume and criticial normal structures including the spinal cord and lungs were analyzed and requested.  Special treatment procedure was performed due to high dose per fraction.  The patient will be monitored for increased risk of toxicity.  Daily imaging using cone beam CT will be used for target localization.  The patient will receive 54 gray in 3 fractions to the right lung target/tumor.  ------------------------------------------------  Jodelle Gross, MD, PhD

## 2016-02-23 NOTE — Addendum Note (Signed)
Encounter addended by: Kyung Rudd, MD on: 02/23/2016  5:26 PM<BR>     Documentation filed: Notes Section, Visit Diagnoses

## 2016-02-23 NOTE — Progress Notes (Signed)
  Radiation Oncology         [phone]) [phone] ________________________________  Name: Dylan Jefferson MRN: [id number]  Date: 11/05/2015  DOB: 09-23-1950  RESPIRATORY MOTION MANAGEMENT SIMULATION  NARRATIVE:  In order to account for effect of respiratory motion on target structures and other organs in the planning and delivery of radiotherapy, this patient underwent respiratory motion management simulation.  To accomplish this, when the patient was brought to the CT simulation planning suite, 4D respiratoy motion management CT images were obtained.  The CT images were loaded into the planning software.  Then, using a variety of tools including Cine, MIP, and standard views, the target volume and planning target volumes (PTV) were delineated.  Avoidance structures were contoured.  Treatment planning then occurred.  Dose volume histograms were generated and reviewed for each of the requested structure.  The resulting plan was carefully reviewed and approved today.  ------------------------------------------------  Jodelle Gross, MD, PhD

## 2016-03-05 ENCOUNTER — Other Ambulatory Visit: Payer: Self-pay | Admitting: Hematology

## 2016-03-05 DIAGNOSIS — C7900 Secondary malignant neoplasm of unspecified kidney and renal pelvis: Secondary | ICD-10-CM

## 2016-03-07 ENCOUNTER — Other Ambulatory Visit: Payer: Self-pay | Admitting: *Deleted

## 2016-03-20 ENCOUNTER — Ambulatory Visit (HOSPITAL_COMMUNITY)
Admission: RE | Admit: 2016-03-20 | Discharge: 2016-03-20 | Disposition: A | Discharge status: Home / Self Care | Payer: Medicare Other | Source: Ambulatory Visit | Attending: Hematology | Admitting: Hematology

## 2016-03-20 DIAGNOSIS — S2242XS Multiple fractures of ribs, left side, sequela: Secondary | ICD-10-CM | POA: Diagnosis not present

## 2016-03-20 DIAGNOSIS — R911 Solitary pulmonary nodule: Secondary | ICD-10-CM | POA: Insufficient documentation

## 2016-03-20 DIAGNOSIS — N200 Calculus of kidney: Secondary | ICD-10-CM | POA: Diagnosis not present

## 2016-03-20 DIAGNOSIS — R935 Abnormal findings on diagnostic imaging of other abdominal regions, including retroperitoneum: Secondary | ICD-10-CM | POA: Insufficient documentation

## 2016-03-20 DIAGNOSIS — I700 Atherosclerosis of aorta: Secondary | ICD-10-CM | POA: Diagnosis not present

## 2016-03-20 DIAGNOSIS — I2510 Atherosclerotic heart disease of native coronary artery without angina pectoris: Secondary | ICD-10-CM | POA: Insufficient documentation

## 2016-03-20 DIAGNOSIS — C7900 Secondary malignant neoplasm of unspecified kidney and renal pelvis: Secondary | ICD-10-CM | POA: Diagnosis present

## 2016-03-20 LAB — GLUCOSE, CAPILLARY: GLUCOSE-CAPILLARY: 121 mg/dL — AB (ref 65–99)

## 2016-03-20 MED ORDER — FLUDEOXYGLUCOSE F - 18 (FDG) INJECTION
9.3400 | Freq: Once | INTRAVENOUS | Status: AC | PRN
  Administered 2016-03-20: 9.34 via INTRAVENOUS

## 2016-03-27 ENCOUNTER — Encounter: Payer: Self-pay | Admitting: Hematology

## 2016-03-27 ENCOUNTER — Other Ambulatory Visit (HOSPITAL_BASED_OUTPATIENT_CLINIC_OR_DEPARTMENT_OTHER): Payer: Medicare Other

## 2016-03-27 ENCOUNTER — Ambulatory Visit (HOSPITAL_BASED_OUTPATIENT_CLINIC_OR_DEPARTMENT_OTHER): Payer: Medicare Other | Admitting: Hematology

## 2016-03-27 ENCOUNTER — Telehealth: Payer: Self-pay | Admitting: Hematology

## 2016-03-27 VITALS — BP 110/67 | HR 53 | Temp 98.1°F | Resp 18 | Ht 69.5 in | Wt 193.0 lb

## 2016-03-27 DIAGNOSIS — D696 Thrombocytopenia, unspecified: Secondary | ICD-10-CM | POA: Diagnosis not present

## 2016-03-27 DIAGNOSIS — C649 Malignant neoplasm of unspecified kidney, except renal pelvis: Secondary | ICD-10-CM

## 2016-03-27 DIAGNOSIS — M069 Rheumatoid arthritis, unspecified: Secondary | ICD-10-CM

## 2016-03-27 DIAGNOSIS — E890 Postprocedural hypothyroidism: Secondary | ICD-10-CM

## 2016-03-27 DIAGNOSIS — C7800 Secondary malignant neoplasm of unspecified lung: Secondary | ICD-10-CM

## 2016-03-27 DIAGNOSIS — C7900 Secondary malignant neoplasm of unspecified kidney and renal pelvis: Secondary | ICD-10-CM

## 2016-03-27 DIAGNOSIS — E039 Hypothyroidism, unspecified: Secondary | ICD-10-CM

## 2016-03-27 DIAGNOSIS — C641 Malignant neoplasm of right kidney, except renal pelvis: Secondary | ICD-10-CM | POA: Diagnosis not present

## 2016-03-27 LAB — CBC & DIFF AND RETIC
BASO%: 0.4 % (ref 0.0–2.0)
Basophils Absolute: 0 10*3/uL (ref 0.0–0.1)
EOS%: 2 % (ref 0.0–7.0)
Eosinophils Absolute: 0.1 10*3/uL (ref 0.0–0.5)
HCT: 36.4 % — ABNORMAL LOW (ref 38.4–49.9)
HGB: 12.6 g/dL — ABNORMAL LOW (ref 13.0–17.1)
IMMATURE RETIC FRACT: 5.5 % (ref 3.00–10.60)
LYMPH%: 37 % (ref 14.0–49.0)
MCH: 38.1 pg — AB (ref 27.2–33.4)
MCHC: 34.6 g/dL (ref 32.0–36.0)
MCV: 110 fL — AB (ref 79.3–98.0)
MONO#: 0.6 10*3/uL (ref 0.1–0.9)
MONO%: 13.1 % (ref 0.0–14.0)
NEUT%: 47.5 % (ref 39.0–75.0)
NEUTROS ABS: 2.1 10*3/uL (ref 1.5–6.5)
PLATELETS: 210 10*3/uL (ref 140–400)
RBC: 3.31 10*6/uL — AB (ref 4.20–5.82)
RDW: 14.2 % (ref 11.0–14.6)
Retic %: 2.71 % — ABNORMAL HIGH (ref 0.80–1.80)
Retic Ct Abs: 89.7 10*3/uL (ref 34.80–93.90)
WBC: 4.5 10*3/uL (ref 4.0–10.3)
lymph#: 1.7 10*3/uL (ref 0.9–3.3)

## 2016-03-27 LAB — COMPREHENSIVE METABOLIC PANEL
ALT: 30 U/L (ref 0–55)
ANION GAP: 8 meq/L (ref 3–11)
AST: 37 U/L — ABNORMAL HIGH (ref 5–34)
Albumin: 3.3 g/dL — ABNORMAL LOW (ref 3.5–5.0)
Alkaline Phosphatase: 97 U/L (ref 40–150)
BILIRUBIN TOTAL: 1.24 mg/dL — AB (ref 0.20–1.20)
BUN: 16 mg/dL (ref 7.0–26.0)
CHLORIDE: 108 meq/L (ref 98–109)
CO2: 25 meq/L (ref 22–29)
Calcium: 9 mg/dL (ref 8.4–10.4)
Creatinine: 1.9 mg/dL — ABNORMAL HIGH (ref 0.7–1.3)
EGFR: 35 mL/min/{1.73_m2} — AB (ref >90)
GLUCOSE: 174 mg/dL — AB (ref 70–140)
POTASSIUM: 4.5 meq/L (ref 3.5–5.1)
SODIUM: 141 meq/L (ref 136–145)
Total Protein: 5.6 g/dL — ABNORMAL LOW (ref 6.4–8.3)

## 2016-03-27 LAB — TSH: TSH: 6.467 m[IU]/L — AB (ref 0.320–4.118)

## 2016-03-27 LAB — LACTATE DEHYDROGENASE: LDH: 210 U/L (ref 125–245)

## 2016-03-27 NOTE — Telephone Encounter (Signed)
AVS REPORT AND APPT SCHD GIVEN PER 08/24 LOS. °

## 2016-03-28 LAB — T4, FREE: T4,Free(Direct): 1.07 ng/dL (ref 0.82–1.77)

## 2016-03-30 NOTE — Progress Notes (Signed)
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 2) Remote history of metastatic renal cell carcinoma Diagnosed with renal cell carcinoma 20 years ago and had a right nephrectomy. About 8 years after that he was noted to have abdominal recurrence in his pancreas spleen and small intestine and had a Whipple's procedure and significant abdominal surgery and notes that 10 out of 17 lymph nodes were positive. He also had his gallbladder removed. Postoperative course was complicated by an internal hemorrhage as per his report. Patient notes 2-3 years after that he had recurrence in his thyroid that led to a thyroidectomy. [June 2004] later he had partial gastrectomy for local recurrence. [February 2005] when he presented with GI bleeding. Patient notes that he has had no known evidence of recurrence over the last 8 years until his recent CT scan showed lung nodules.  Current Treatment: Sutent 37.5 mg po daily for 2 weeks on and 1 weeks off. S/p 6 cycles  SBRT to dominant RML nodule.  Previous treatment Sutent on cycle 1 - 50 mg by mouth daily for 4 weeks on and 2-weeks off. He was dose adjusted to 37.5 mg by mouth daily for 2 weeks with 1 week of to help mitigate issues with fatigue, mild hyperbilirubinemia, cytopenias.  INTERVAL HISTORY:  Mr Boice is here for for his scheduled follow-up after having completed his 6th cycle of Sutent. Notes no acute new symptoms.  He still has some grade 1 fatigue and grade 1-2 dysguesia with the Sutent but is in good spirits and has been maintaining his weight. No significant GI symptoms. No fevers or chills. Breathing has been stable with no new shortness of breath.  No new chest pain.  No  new focal neurological deficits or headaches.  No fevers or chills or other signs of infection. Diabetes and been reasonably controlled.  Continues to be followed by his rheumatologist for his rheumatoid arthritis. Wife is present with him for this visit. He notes that he is growing a beard since he wants to play [employer]ong this December.    REVIEW OF SYSTEMS:    10 Point review of systems of done and is negative except as noted above.   Past Medical History:  Diagnosis Date  . Arthritis   . Cancer [facility])    Renal cell  . Depression   . Diabetes ([place])   . Hypertension   . Hypothyroidism   . Kidney disease   . Nephrolithiasis   . Rheumatoid arthritis ([place])     . Past Surgical History:  Procedure Laterality Date  . GASTRECTOMY     tumor removed  . NEPHRECTOMY Right   . THYROIDECTOMY    . WHIPPLE PROCEDURE      . Social History  Substance Use Topics  . Smoking status: Never Smoker  . Smokeless tobacco: Never Used  . Alcohol use Yes     Comment: occasional beer    ALLERGIES:  has No Known Allergies.  MEDICATIONS:  Current Outpatient Prescriptions  Medication Sig Dispense Refill  . amLODipine (NORVASC) 10 MG tablet Take 1 tablet (10 mg total) by mouth daily. 90 tablet 3  . Ascorbic Acid (VITAMIN C) 1000 MG tablet Take 1,000 mg by mouth daily.    Marland Kitchen aspirin 81 MG tablet Take 81 mg  by mouth daily.    Marland Kitchen atenolol (TENORMIN) 100 MG tablet     . augmented betamethasone dipropionate (DIPROLENE-AF) 0.05 % cream   0  . calcium citrate-vitamin D (CITRACAL+D) 315-200 MG-UNIT per tablet Take 2 tablets by mouth daily.     . Cyanocobalamin (VITAMIN B-12) 2500 MCG SUBL Take by mouth daily.     . dorzolamide-timolol (COSOPT) 22.3-6.8 MG/ML ophthalmic solution place 1 drop into affected eye twice a day  1  . famotidine (PEPCID) 20 MG tablet Take 20 mg by mouth 2 (two) times daily.    . ferrous sulfate 325 (65 FE) MG tablet Take 325 mg by mouth 2 (two) times daily with a meal.       . Glucosamine-MSM-Hyaluronic Acd (JOINT HEALTH) 750-375-30 MG TABS Take 2 tablets by mouth daily.    Marland Kitchen HYDROcodone-acetaminophen (NORCO/VICODIN) 5-325 MG tablet Take 1-2 tablets by mouth every 4 (four) hours as needed for moderate pain.    . hydroxychloroquine (PLAQUENIL) 200 MG tablet   0  . insulin lispro (HUMALOG) 100 UNIT/ML injection Inject into the skin 3 (three) times daily before meals.    . Iron-Vitamins (GERITOL COMPLETE) TABS Take 1 tablet by mouth daily.    . Lactobacillus (ACIDOPHILUS) CAPS capsule Take 1 capsule by mouth daily.    Marland Kitchen latanoprost (XALATAN) 0.005 % ophthalmic solution place 1 drop into both eyes every evening  1  . LEVEMIR 100 UNIT/ML injection Inject 7 Units into the skin 2 (two) times daily.   1  . levothyroxine (SYNTHROID, LEVOTHROID) 300 MCG tablet     . lovastatin (MEVACOR) 40 MG tablet     . minoxidil (LONITEN) 10 MG tablet     . Multiple Vitamin (MULTIVITAMIN) tablet Take 1 tablet by mouth daily.    . Omega-3 Fatty Acids (FISH OIL) 1200 MG CAPS Take 1 capsule by mouth daily.    . Pancrelipase, Lip-Prot-Amyl, (CREON) 6000 UNITS CPEP Take 4 capsules by mouth 3 (three) times daily.    . potassium citrate (UROCIT-K) 10 MEQ (1080 MG) SR tablet     . SUNItinib (SUTENT) 37.5 MG capsule Take 1 tablet (37.5 mg) by mouth daily for 2 wks followed by 1 wk rest 28 capsule 1  . traMADol (ULTRAM) 50 MG tablet Take by mouth as needed.    . valsartan-hydrochlorothiazide (DIOVAN-HCT) 160-12.5 MG per tablet     . VOLTAREN 1 % GEL apply 3 grams to LARGE JOINTS three times a day if needed  0   No current facility-administered medications for this visit.     PHYSICAL EXAMINATION: ECOG PERFORMANCE STATUS: 1 - Symptomatic but completely ambulatory  . Vitals:   03/27/16 0831  BP: 110/67  Pulse: (!) 53  Resp: 18  Temp: 98.1 F (36.7 C)    Filed Weights   03/27/16 0831  Weight: 193 lb (87.5 kg)   .Body mass index is 28.09 kg/m.  GENERAL:alert, in no acute distress  and comfortable SKIN: skin color, texture, turgor are normal, no rashes or significant lesions EYES: normal, conjunctiva are pink and non-injected, sclera clear OROPHARYNX:no exudate, no erythema and lips, buccal mucosa, and tongue normal  NECK: supple, no JVD, thyroid normal size, non-tender, without nodularity LYMPH:  no palpable lymphadenopathy in the cervical, axillary or inguinal LUNGS: clear to auscultation with normal respiratory effort HEART: regular rate & rhythm,  no murmurs and no lower extremity edema ABDOMEN: abdomen soft, non-tender, normoactive bowel sounds  Musculoskeletal: no cyanosis of digits and no clubbing  PSYCH: alert &  oriented x 3 with fluent speech NEURO: no focal motor/sensory deficits  LABORATORY DATA:   I have reviewed the data as listed  . CBC Latest Ref Rng & Units 03/27/2016 02/14/2016 01/01/2016  WBC 4.0 - 10.3 10e3/uL 4.5 5.6 4.1  Hemoglobin 13.0 - 17.1 g/dL 12.6(L) 13.7 13.1  Hematocrit 38.4 - 49.9 % 36.4(L) 39.4 37.5(L)  Platelets 140 - 400 10e3/uL 210 217 159   . CBC    Component Value Date/Time   WBC 4.5 03/27/2016 0824   WBC 7.3 06/13/2015 0915   RBC 3.31 (L) 03/27/2016 0824   RBC 4.36 06/13/2015 0915   HGB 12.6 (L) 03/27/2016 0824   HCT 36.4 (L) 03/27/2016 0824   PLT 210 03/27/2016 0824   MCV 110.0 (H) 03/27/2016 0824   MCH 38.1 (H) 03/27/2016 0824   MCH 33.9 06/13/2015 0915   MCHC 34.6 03/27/2016 0824   MCHC 34.3 06/13/2015 0915   RDW 14.2 03/27/2016 0824   LYMPHSABS 1.7 03/27/2016 0824   MONOABS 0.6 03/27/2016 0824   EOSABS 0.1 03/27/2016 0824   BASOSABS 0.0 03/27/2016 0824   . CMP Latest Ref Rng & Units 03/27/2016 02/14/2016 01/01/2016  Glucose 70 - 140 mg/dl 174(H) 202(H) 263(H)  BUN 7.0 - 26.0 mg/dL 16.0 14.7 9.7  Creatinine 0.7 - 1.3 mg/dL 1.9(H) 1.5(H) 1.5(H)  Sodium 136 - 145 mEq/L 141 145 139  Potassium 3.5 - 5.1 mEq/L 4.5 4.7 4.4  Chloride 101 - 111 mmol/L - - -  CO2 22 - 29 mEq/L 25 31(H) 24  Calcium 8.4 - 10.4 mg/dL  9.0 9.4 8.4  Total Protein 6.4 - 8.3 g/dL 5.6(L) 6.1(L) 5.4(L)  Total Bilirubin 0.20 - 1.20 mg/dL 1.24(H) 1.40(H) 1.88(H)  Alkaline Phos 40 - 150 U/L 97 105 90  AST 5 - 34 U/L 37(H) 22 26  ALT 0 - 55 U/L 30 22 28       RADIOGRAPHIC STUDIES: I have personally reviewed the radiological images as listed and agreed with the findings in the report. Nm Pet Image Restag (ps) Skull Base To Thigh  Result Date: 03/20/2016 CLINICAL DATA:  Subsequent treatment strategy for restaging of renal cell. EXAM: NUCLEAR MEDICINE PET SKULL BASE TO THIGH TECHNIQUE: 9.4 mCi F-18 FDG was injected intravenously. Full-ring PET imaging was performed from the skull base to thigh after the radiotracer. CT data was obtained and used for attenuation correction and anatomic localization. FASTING BLOOD GLUCOSE:  Value: 121 mg/dl COMPARISON:  12/27/2015 FINDINGS: NECK No areas of abnormal hypermetabolism. CHEST No thoracic nodal hypermetabolism. At the site of dominant right middle lobe lung nodule is hypermetabolic consolidation today. This measures a S.U.V. max of 8.9, including image 43/series 8. This obscures the underlying lung nodule. No new hypermetabolic pulmonary nodules are identified. ABDOMEN/PELVIS No intra abdominal/pelvic hypermetabolism identified. An anterior abdominal wall area of skin/subcutaneous thickening and hypermetabolism is again identified. This measures maximally 2.8 cm and a S.U.V. max of 6.7. Compare 2.7 cm and a S.U.V. max of 5.1. SKELETON Hypermetabolism corresponding to sclerosis and healing fracture of the anterior fourth left rib. This measures a S.U.V. max of 4.6 on image 92/series 4. No hypermetabolism or lesion in this area on the prior PET. Similarly, right fifth anterior rib sclerosis and hypermetabolism measures a S.U.V. max of 4.1 on image 93/series 4. Absent on the prior. CT IMAGES PERFORMED FOR ATTENUATION CORRECTION No cervical adenopathy. Bilateral carotid atherosclerosis. Thyroidectomy. Lad  coronary artery atherosclerosis. 2 mm right upper lobe pulmonary nodule on image 36/series 8 is unchanged. 3 mm  left lower lobe nodule is similar on image 67/series 8. Pneumobilia. At least partial pancreatectomy. Partial gastrectomy. Right nephrectomy. Left nephrolithiasis. Abdominal aortic and branch vessel atherosclerosis. IMPRESSION: 1. Consolidation and hypermetabolism at the site of previously described right middle lobe pulmonary nodule. This is presumably due to radiation pneumonitis. This obscures the pulmonary nodule. 2. Bilateral rib foci of hypermetabolism with left-sided healing fracture and vague right-sided sclerosis. Favored to related interval trauma, given absence of lesions in this area on the prior exam. Correlate with history of interval trauma. Osseous metastasis with pathologic fracture cannot be excluded. 3.  Coronary artery atherosclerosis. Aortic atherosclerosis. 4. Left nephrolithiasis. 5. Probable injection granuloma or sebaceous cyst about the anterior abdominal wall. Recommend attention on follow-up. Electronically Signed   By: Abigail Miyamoto M.D.   On: 03/20/2016 14:56     ASSESSMENT & PLAN:  65 year old Caucasian male with  #1 Remote history of metastatic renal cell carcinoma status post right-sided nephrectomy with multiple recurrences treated with surgical excision as detailed above. He has been NED status for the last 8 years. He stopped following up with the cancer Center due to concerns about medical costs. He has been referred back to [place] by Dr. Sharlett Iles after recent CT of the chest showed multiple lung nodules concerning for neoplastic etiology and was noted to have recurrent metastatic RCC.  #2 Recurrent metastatic Renal cell carcinoma with multiple lung metastases. Patient is status post Sutent 6 cycles. Has had SBRT to the dominant right lung nodule with partial response. PET/CT scan from 12/27/2015 shows no evidence of disease progression. PET/CT 03/20/2016 - shows  some radiation pneumonitis no new pulmonary nodules. Subtle uptake in the ribs which could be from trauma/bone change due to chronic kidney disease. Though bone metastases cannot be ruled out findings are not definitive enough to warrant change in treatment at this time. No other evidence of metastatic RCC progression at this time.  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 Confirmed by [place].  #4 mild thrombocytopenia likely related to Sutent- resolved with dose reduction. #5 elevation in bilirubin levels likely due to Sutent. Bilirubin stable .  #6 grade 1-2 fatigue due to Sutent -better with dose reduction and changing schedule to 2 week on and 1 week. #7 Dysguesia from Sutent - manageable per patient. He is trying to eat as well as he can and is maintaining his body weight. #8 RBC macrocytosis due to Sutent 9 chronic kidney disease/single kidney -creatinine slightly higher at 1.9.Marland KitchenMarland Kitchenwill need to monitor this. #10 rheumatoid arthritis -follows with Dr. Estanislado Pandy for mx Plan -Patient has no clinical evidence of disease progression at this time.  -PET scan findings were discussed in detail with the patient. -No overt evidence of metastatic disease progression on PET/CT scan to warrant treatment change at this time. Though we'll need to keep an eye on his rib changes since these might be signs of osseous mets. - No prohibitive toxicities of this time that would indicate medication dose changes. -No significant other medication changes since his last clinic visit. -Continue current dose of Sutent with close monitoring of labs. -We will see him back in 6 weeks with rpt labs and clinic visit. --Continue follow-up with primary care physician and rheumatology. Patient is agreeable with this plan of treatment.  Return to care with Dr. Irene Limbo in 6 weeks with CBC, CMP, earlier if any new questions or concerns  I spent 20 minutes counseling the patient  face to face. The  total time spent in the appointment was 25 minutes and more than 50% was on counseling and direct patient cares.    Sullivan Lone MD [place] [facility] Hematology/Oncology Physician [facility]  (Office):       [phone] (Work cell):  [phone] (Fax):           [phone]

## 2016-05-08 ENCOUNTER — Encounter: Payer: Self-pay | Admitting: Hematology

## 2016-05-08 ENCOUNTER — Other Ambulatory Visit (HOSPITAL_BASED_OUTPATIENT_CLINIC_OR_DEPARTMENT_OTHER): Payer: Medicare Other

## 2016-05-08 ENCOUNTER — Telehealth: Payer: Self-pay | Admitting: Hematology

## 2016-05-08 ENCOUNTER — Ambulatory Visit (HOSPITAL_BASED_OUTPATIENT_CLINIC_OR_DEPARTMENT_OTHER): Payer: Medicare Other | Admitting: Hematology

## 2016-05-08 VITALS — BP 131/64 | HR 52 | Temp 98.4°F | Resp 18 | Wt 193.0 lb

## 2016-05-08 DIAGNOSIS — M069 Rheumatoid arthritis, unspecified: Secondary | ICD-10-CM | POA: Diagnosis not present

## 2016-05-08 DIAGNOSIS — C649 Malignant neoplasm of unspecified kidney, except renal pelvis: Secondary | ICD-10-CM | POA: Diagnosis not present

## 2016-05-08 DIAGNOSIS — C641 Malignant neoplasm of right kidney, except renal pelvis: Secondary | ICD-10-CM | POA: Diagnosis not present

## 2016-05-08 DIAGNOSIS — N189 Chronic kidney disease, unspecified: Secondary | ICD-10-CM

## 2016-05-08 DIAGNOSIS — C7800 Secondary malignant neoplasm of unspecified lung: Secondary | ICD-10-CM

## 2016-05-08 DIAGNOSIS — C7900 Secondary malignant neoplasm of unspecified kidney and renal pelvis: Secondary | ICD-10-CM

## 2016-05-08 LAB — CBC & DIFF AND RETIC
BASO%: 0.5 % (ref 0.0–2.0)
BASOS ABS: 0 10*3/uL (ref 0.0–0.1)
EOS ABS: 0.1 10*3/uL (ref 0.0–0.5)
EOS%: 2.8 % (ref 0.0–7.0)
HEMATOCRIT: 36.7 % — AB (ref 38.4–49.9)
HEMOGLOBIN: 12.5 g/dL — AB (ref 13.0–17.1)
IMMATURE RETIC FRACT: 8.4 % (ref 3.00–10.60)
LYMPH#: 1.7 10*3/uL (ref 0.9–3.3)
LYMPH%: 39 % (ref 14.0–49.0)
MCH: 37.9 pg — ABNORMAL HIGH (ref 27.2–33.4)
MCHC: 34.1 g/dL (ref 32.0–36.0)
MCV: 111.2 fL — ABNORMAL HIGH (ref 79.3–98.0)
MONO#: 0.6 10*3/uL (ref 0.1–0.9)
MONO%: 13.2 % (ref 0.0–14.0)
NEUT%: 44.5 % (ref 39.0–75.0)
NEUTROS ABS: 1.9 10*3/uL (ref 1.5–6.5)
Platelets: 205 10*3/uL (ref 140–400)
RBC: 3.3 10*6/uL — ABNORMAL LOW (ref 4.20–5.82)
RDW: 14.1 % (ref 11.0–14.6)
RETIC %: 2.65 % — AB (ref 0.80–1.80)
RETIC CT ABS: 87.45 10*3/uL (ref 34.80–93.90)
WBC: 4.3 10*3/uL (ref 4.0–10.3)

## 2016-05-08 LAB — COMPREHENSIVE METABOLIC PANEL
ALBUMIN: 3.5 g/dL (ref 3.5–5.0)
ALK PHOS: 90 U/L (ref 40–150)
ALT: 25 U/L (ref 0–55)
AST: 30 U/L (ref 5–34)
Anion Gap: 8 mEq/L (ref 3–11)
BUN: 13.5 mg/dL (ref 7.0–26.0)
CALCIUM: 8.9 mg/dL (ref 8.4–10.4)
CO2: 28 mEq/L (ref 22–29)
CREATININE: 1.4 mg/dL — AB (ref 0.7–1.3)
Chloride: 104 mEq/L (ref 98–109)
EGFR: 50 mL/min/{1.73_m2} — ABNORMAL LOW (ref >90)
GLUCOSE: 228 mg/dL — AB (ref 70–140)
POTASSIUM: 4.5 meq/L (ref 3.5–5.1)
SODIUM: 140 meq/L (ref 136–145)
Total Bilirubin: 1.17 mg/dL (ref 0.20–1.20)
Total Protein: 5.7 g/dL — ABNORMAL LOW (ref 6.4–8.3)

## 2016-05-08 LAB — LACTATE DEHYDROGENASE: LDH: 201 U/L (ref 125–245)

## 2016-05-08 NOTE — Telephone Encounter (Signed)
Pt confirmed appt and received avs °

## 2016-05-13 ENCOUNTER — Telehealth: Payer: Self-pay | Admitting: *Deleted

## 2016-05-13 NOTE — Telephone Encounter (Signed)
Called Dylan Jefferson to relay that since his PET was good that he did not need a CT Scan of his chest at this time, but it is recommended that he have a repeat CT of the chest in 6 months.  He stated that his preference is to have Dr. Irene Limbo schedule his future scan.  Stated understanding.

## 2016-05-21 ENCOUNTER — Other Ambulatory Visit: Payer: Self-pay | Admitting: *Deleted

## 2016-05-21 DIAGNOSIS — C649 Malignant neoplasm of unspecified kidney, except renal pelvis: Secondary | ICD-10-CM

## 2016-05-21 MED ORDER — SUNITINIB MALATE 37.5 MG PO CAPS: ORAL_CAPSULE | ORAL | 1 refills | Status: DC

## 2016-05-27 ENCOUNTER — Encounter: Payer: Self-pay | Admitting: Hematology

## 2016-05-27 NOTE — Progress Notes (Signed)
[employer] shipped Sutent to pt on 05/26/16.

## 2016-05-28 ENCOUNTER — Encounter: Payer: Self-pay | Admitting: *Deleted

## 2016-05-28 DIAGNOSIS — M170 Bilateral primary osteoarthritis of knee: Secondary | ICD-10-CM

## 2016-05-28 DIAGNOSIS — M5030 Other cervical disc degeneration, unspecified cervical region: Secondary | ICD-10-CM

## 2016-05-28 DIAGNOSIS — C73 Malignant neoplasm of thyroid gland: Secondary | ICD-10-CM | POA: Insufficient documentation

## 2016-05-28 DIAGNOSIS — I739 Peripheral vascular disease, unspecified: Secondary | ICD-10-CM

## 2016-05-28 DIAGNOSIS — M5136 Other intervertebral disc degeneration, lumbar region: Secondary | ICD-10-CM

## 2016-05-28 DIAGNOSIS — N29 Other disorders of kidney and ureter in diseases classified elsewhere: Secondary | ICD-10-CM

## 2016-05-28 DIAGNOSIS — M19041 Primary osteoarthritis, right hand: Secondary | ICD-10-CM | POA: Insufficient documentation

## 2016-05-28 DIAGNOSIS — M064 Inflammatory polyarthropathy: Secondary | ICD-10-CM

## 2016-05-28 DIAGNOSIS — M19042 Primary osteoarthritis, left hand: Secondary | ICD-10-CM

## 2016-05-28 DIAGNOSIS — E785 Hyperlipidemia, unspecified: Secondary | ICD-10-CM

## 2016-05-28 DIAGNOSIS — M51369 Other intervertebral disc degeneration, lumbar region without mention of lumbar back pain or lower extremity pain: Secondary | ICD-10-CM | POA: Insufficient documentation

## 2016-05-28 HISTORY — DX: Other disorders of calcium metabolism: E83.59

## 2016-05-28 HISTORY — DX: Peripheral vascular disease, unspecified: I73.9

## 2016-05-28 HISTORY — DX: Inflammatory polyarthropathy: M06.4

## 2016-05-28 HISTORY — DX: Other intervertebral disc degeneration, lumbar region: M51.36

## 2016-05-28 HISTORY — DX: Hyperlipidemia, unspecified: E78.5

## 2016-05-28 HISTORY — DX: Primary osteoarthritis, right hand: M19.041

## 2016-05-28 HISTORY — DX: Other intervertebral disc degeneration, lumbar region without mention of lumbar back pain or lower extremity pain: M51.369

## 2016-05-28 HISTORY — DX: Other cervical disc degeneration, unspecified cervical region: M50.30

## 2016-05-28 HISTORY — DX: Bilateral primary osteoarthritis of knee: M17.0

## 2016-05-28 HISTORY — DX: Malignant neoplasm of thyroid gland: C73

## 2016-05-29 ENCOUNTER — Ambulatory Visit: Payer: Self-pay | Admitting: Rheumatology

## 2016-06-05 ENCOUNTER — Telehealth: Payer: Self-pay | Admitting: Pharmacist

## 2016-06-05 NOTE — Telephone Encounter (Signed)
Oral Chemotherapy Pharmacist Encounter  Received call from patient today with questions about re-applying for medication assistance through [place] for his Sutent. Patient will fill out as much of the application as he can and bring that in with his proof of income from 2016 to [facility] on Monday 11/6. Oral Chemo Clinic will ensure application is complete and get it faxed to [place].  Oral Chemo Clinic will continue to follow.  Johny Drilling, PharmD, BCPS 06/05/2016  3:55 PM Oral Chemotherapy Clinic [phone]

## 2016-06-11 ENCOUNTER — Telehealth: Payer: Self-pay | Admitting: *Deleted

## 2016-06-11 NOTE — Telephone Encounter (Signed)
Call receive @ 930 Pt. called and asked to speak with the nurse that was working with Dr. Irene Limbo. Pt. was made aware that Dr. Irene Limbo was out of the office today, & made aware of on call provider. Pt. Stated that he just wanted to leave a message for nurse to give him a call back @ [phone]. Call was transferred to vmail/message left.

## 2016-06-16 NOTE — Progress Notes (Signed)
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 2) Remote history of metastatic renal cell carcinoma Diagnosed with renal cell carcinoma 20 years ago and had a right nephrectomy. About 8 years after that he was noted to have abdominal recurrence in his pancreas spleen and small intestine and had a Whipple's procedure and significant abdominal surgery and notes that 10 out of 17 lymph nodes were positive. He also had his gallbladder removed. Postoperative course was complicated by an internal hemorrhage as per his report. Patient notes 2-3 years after that he had recurrence in his thyroid that led to a thyroidectomy. [June 2004] later he had partial gastrectomy for local recurrence. [February 2005] when he presented with GI bleeding. Patient notes that he has had no known evidence of recurrence over the last 8 years until his recent CT scan showed lung nodules.  Current Treatment: Sutent 37.5 mg po daily for 2 weeks on and 1 weeks off. S/p 7 cycles  SBRT to dominant RML nodule.  Previous treatment Sutent on cycle 1 - 50 mg by mouth daily for 4 weeks on and 2-weeks off. He was dose adjusted to 37.5 mg by mouth daily for 2 weeks with 1 week of to help mitigate issues with fatigue, mild hyperbilirubinemia, cytopenias.  INTERVAL HISTORY:  Dylan Jefferson is here for for his scheduled follow-up after having completed his 7th cycle of Sutent. Notes no acute new symptoms.  He still has some grade 1 fatigue and grade 1 dysguesia with the Sutent but is in good spirits and has been maintaining his weight. No significant GI symptoms. No fevers or chills. Breathing has been stable with no new shortness of breath.  No new chest pain.  No new  focal neurological deficits or headaches.  No fevers or chills or other signs of infection. Diabetes and been reasonably controlled.  Wife is present with him for this visit.  REVIEW OF SYSTEMS:    10 Point review of systems of done and is negative except as noted above.   Past Medical History:  Diagnosis Date  . Arthritis   . Cancer [facility])    Renal cell  . DDD (degenerative disc disease), cervical 05/28/2016  . DDD (degenerative disc disease), lumbar 05/28/2016  . Depression   . Diabetes ([place])   . Hyperlipidemia 05/28/2016  . Hypertension   . Hypothyroidism   . 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 Kidney disease   . Nephrolithiasis   . Osteoarthritis of both hands 05/28/2016  . Osteoarthritis of both knees 05/28/2016  . Peripheral vascular disorder ([place]) 05/28/2016  . 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 Rheumatoid arthritis ([place])   . Thyroid cancer ([place]) 05/28/2016    . Past Surgical History:  Procedure Laterality Date  . CATARACT EXTRACTION Bilateral 2013  . CHOLECYSTECTOMY    . GASTRECTOMY     tumor removed  . LIPOMA EXCISION Right 1999  . NEPHRECTOMY Right   . PANCREATECTOMY    . SPLENECTOMY    . THYROIDECTOMY    . URETERAL STENT PLACEMENT Left 2012  . WHIPPLE PROCEDURE      . Social History  Substance Use Topics  . Smoking status: Never Smoker  . Smokeless tobacco: Never Used  . Alcohol use Yes  Comment: occasional beer    ALLERGIES:  has No Known Allergies.  MEDICATIONS:  Current Outpatient Prescriptions  Medication Sig Dispense Refill  . amLODipine (NORVASC) 10 MG tablet Take 1 tablet (10 mg total) by mouth daily. 90 tablet 3  . Ascorbic Acid (VITAMIN C) 1000 MG tablet Take 1,000 mg by mouth daily.    Marland Kitchen aspirin 81 MG tablet Take 81 mg by mouth daily.    Marland Kitchen atenolol (TENORMIN) 100 MG tablet     . augmented betamethasone dipropionate (DIPROLENE-AF) 0.05 % cream   0  . calcium citrate-vitamin  D (CITRACAL+D) 315-200 MG-UNIT per tablet Take 2 tablets by mouth daily.     . Cyanocobalamin (VITAMIN B-12) 2500 MCG SUBL Take by mouth daily.     Marland Kitchen docusate sodium (COLACE) 100 MG capsule Take 100 mg by mouth 2 (two) times daily.    . dorzolamide-timolol (COSOPT) 22.3-6.8 MG/ML ophthalmic solution place 1 drop into affected eye twice a day  1  . famotidine (PEPCID) 20 MG tablet Take 20 mg by mouth 2 (two) times daily.    . ferrous sulfate 325 (65 FE) MG tablet Take 325 mg by mouth 2 (two) times daily with a meal.     . Glucosamine-MSM-Hyaluronic Acd (JOINT HEALTH) 750-375-30 MG TABS Take 2 tablets by mouth daily.    Marland Kitchen HYDROcodone-acetaminophen (NORCO/VICODIN) 5-325 MG tablet Take 1-2 tablets by mouth every 4 (four) hours as needed for moderate pain.    . hydroxychloroquine (PLAQUENIL) 200 MG tablet   0  . insulin lispro (HUMALOG) 100 UNIT/ML injection Inject into the skin 3 (three) times daily before meals.    . Iron-Vitamins (GERITOL COMPLETE) TABS Take 1 tablet by mouth daily.    . Lactobacillus (ACIDOPHILUS) CAPS capsule Take 1 capsule by mouth daily.    Marland Kitchen latanoprost (XALATAN) 0.005 % ophthalmic solution place 1 drop into both eyes every evening  1  . LEVEMIR 100 UNIT/ML injection Inject 7 Units into the skin 2 (two) times daily.   1  . levothyroxine (SYNTHROID, LEVOTHROID) 300 MCG tablet     . lovastatin (MEVACOR) 40 MG tablet     . minoxidil (LONITEN) 10 MG tablet     . Multiple Vitamin (MULTIVITAMIN) tablet Take 1 tablet by mouth daily.    . Omega-3 Fatty Acids (FISH OIL) 1200 MG CAPS Take 1 capsule by mouth daily.    . Pancrelipase, Lip-Prot-Amyl, (CREON) 6000 UNITS CPEP Take 4 capsules by mouth 3 (three) times daily.    . potassium citrate (UROCIT-K) 10 MEQ (1080 MG) SR tablet     . SUNItinib (SUTENT) 37.5 MG capsule Take 1 tablet (37.5 mg) by mouth daily for 2 wks followed by 1 wk rest 28 capsule 1  . traMADol (ULTRAM) 50 MG tablet Take by mouth as needed.    .  valsartan-hydrochlorothiazide (DIOVAN-HCT) 160-12.5 MG per tablet     . VOLTAREN 1 % GEL apply 3 grams to LARGE JOINTS three times a day if needed  0   No current facility-administered medications for this visit.     PHYSICAL EXAMINATION: ECOG PERFORMANCE STATUS: 1 - Symptomatic but completely ambulatory  . Vitals:   05/08/16 0934  BP: 131/64  Pulse: (!) 52  Resp: 18  Temp: 98.4 F (36.9 C)    Filed Weights   05/08/16 0934  Weight: 193 lb (87.5 kg)   .Body mass index is 28.09 kg/m.  GENERAL:alert, in no acute distress and comfortable SKIN: skin color, texture, turgor are normal, no rashes  or significant lesions EYES: normal, conjunctiva are pink and non-injected, sclera clear OROPHARYNX:no exudate, no erythema and lips, buccal mucosa, and tongue normal  NECK: supple, no JVD, thyroid normal size, non-tender, without nodularity LYMPH:  no palpable lymphadenopathy in the cervical, axillary or inguinal LUNGS: clear to auscultation with normal respiratory effort HEART: regular rate & rhythm,  no murmurs and no lower extremity edema ABDOMEN: abdomen soft, non-tender, normoactive bowel sounds  Musculoskeletal: no cyanosis of digits and no clubbing  PSYCH: alert & oriented x 3 with fluent speech NEURO: no focal motor/sensory deficits  LABORATORY DATA:   I have reviewed the data as listed  . CBC Latest Ref Rng & Units 05/08/2016 03/27/2016 02/14/2016  WBC 4.0 - 10.3 10e3/uL 4.3 4.5 5.6  Hemoglobin 13.0 - 17.1 g/dL 12.5(L) 12.6(L) 13.7  Hematocrit 38.4 - 49.9 % 36.7(L) 36.4(L) 39.4  Platelets 140 - 400 10e3/uL 205 210 217   . CBC    Component Value Date/Time   WBC 4.3 05/08/2016 0901   WBC 7.3 06/13/2015 0915   RBC 3.30 (L) 05/08/2016 0901   RBC 4.36 06/13/2015 0915   HGB 12.5 (L) 05/08/2016 0901   HCT 36.7 (L) 05/08/2016 0901   PLT 205 05/08/2016 0901   MCV 111.2 (H) 05/08/2016 0901   MCH 37.9 (H) 05/08/2016 0901   MCH 33.9 06/13/2015 0915   MCHC 34.1 05/08/2016  0901   MCHC 34.3 06/13/2015 0915   RDW 14.1 05/08/2016 0901   LYMPHSABS 1.7 05/08/2016 0901   MONOABS 0.6 05/08/2016 0901   EOSABS 0.1 05/08/2016 0901   BASOSABS 0.0 05/08/2016 0901   . CMP Latest Ref Rng & Units 05/08/2016 03/27/2016 02/14/2016  Glucose 70 - 140 mg/dl 228(H) 174(H) 202(H)  BUN 7.0 - 26.0 mg/dL 13.5 16.0 14.7  Creatinine 0.7 - 1.3 mg/dL 1.4(H) 1.9(H) 1.5(H)  Sodium 136 - 145 mEq/L 140 141 145  Potassium 3.5 - 5.1 mEq/L 4.5 4.5 4.7  Chloride 101 - 111 mmol/L - - -  CO2 22 - 29 mEq/L 28 25 31(H)  Calcium 8.4 - 10.4 mg/dL 8.9 9.0 9.4  Total Protein 6.4 - 8.3 g/dL 5.7(L) 5.6(L) 6.1(L)  Total Bilirubin 0.20 - 1.20 mg/dL 1.17 1.24(H) 1.40(H)  Alkaline Phos 40 - 150 U/L 90 97 105  AST 5 - 34 U/L 30 37(H) 22  ALT 0 - 55 U/L 25 30 22       RADIOGRAPHIC STUDIES: I have personally reviewed the radiological images as listed and agreed with the findings in the report. No results found.   ASSESSMENT & PLAN:  65 year old Caucasian male with  #1 Remote history of metastatic renal cell carcinoma status post right-sided nephrectomy with multiple recurrences treated with surgical excision as detailed above. He has been NED status for the last 8 years. He stopped following up with the cancer Center due to concerns about medical costs. He has been referred back to [place] by Dr. Sharlett Iles after recent CT of the chest showed multiple lung nodules concerning for neoplastic etiology and was noted to have recurrent metastatic RCC.  #2 Recurrent metastatic Renal cell carcinoma with multiple lung metastases. Patient is status post Sutent 7 cycles. Has had SBRT to the dominant right lung nodule with partial response. PET/CT scan from 12/27/2015 shows no evidence of disease progression. PET/CT 03/20/2016 - shows some radiation pneumonitis no new pulmonary nodules. Subtle uptake in the ribs which could be from trauma/bone change due to chronic kidney disease. Though bone metastases cannot be ruled  out findings are not  definitive enough to warrant change in treatment at this time. No other evidence of metastatic RCC progression at this time.  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 Confirmed by [place].  #4 mild thrombocytopenia likely related to Sutent- resolved with dose reduction. #5 elevation in bilirubin levels likely due to Sutent. Bilirubin stable .  #6 grade 1-2 fatigue due to Sutent -better with dose reduction and changing schedule to 2 week on and 1 week. #7 Dysguesia from Sutent - manageable per patient. He is trying to eat as well as he can and is maintaining his body weight. #8 RBC macrocytosis due to Sutent 9 chronic kidney disease/single kidney -creatinine stable at 1.4.Marland KitchenMarland Kitchenwill need to monitor this. #10 rheumatoid arthritis -follows with Dr. Estanislado Pandy for mx Plan -Patient has no clinical evidence of disease progression at this time.  - No prohibitive toxicities of this time that would indicate medication dose changes. -No significant other medication changes since his last clinic visit. -Continue current dose of Sutent with close monitoring of labs. -We will see him back in 6 weeks with rpt labs and clinic visit and PET/CT --Continue follow-up with primary care physician and rheumatology.  Return to care with Dr. Irene Limbo in 6 weeks with CBC, CMP and PET/CT, earlier if any new questions or concerns  I spent 20 minutes counseling the patient face to face. The total time spent in the appointment was 25 minutes and more than 50% was on counseling and direct patient cares.    Sullivan Lone MD [place] AAHIVMS [facility] Hematology/Oncology Physician [facility]  (Office):       [phone] (Work cell):  [phone] (Fax):           [phone]

## 2016-06-19 ENCOUNTER — Ambulatory Visit (HOSPITAL_BASED_OUTPATIENT_CLINIC_OR_DEPARTMENT_OTHER): Payer: Medicare Other | Admitting: Hematology

## 2016-06-19 ENCOUNTER — Encounter: Payer: Self-pay | Admitting: Hematology

## 2016-06-19 ENCOUNTER — Telehealth: Payer: Self-pay | Admitting: Hematology

## 2016-06-19 ENCOUNTER — Other Ambulatory Visit (HOSPITAL_BASED_OUTPATIENT_CLINIC_OR_DEPARTMENT_OTHER): Payer: Medicare Other

## 2016-06-19 VITALS — BP 135/71 | HR 45 | Temp 98.0°F | Resp 18 | Wt 191.7 lb

## 2016-06-19 DIAGNOSIS — C649 Malignant neoplasm of unspecified kidney, except renal pelvis: Secondary | ICD-10-CM

## 2016-06-19 DIAGNOSIS — E039 Hypothyroidism, unspecified: Secondary | ICD-10-CM | POA: Diagnosis not present

## 2016-06-19 DIAGNOSIS — C7800 Secondary malignant neoplasm of unspecified lung: Secondary | ICD-10-CM

## 2016-06-19 DIAGNOSIS — C641 Malignant neoplasm of right kidney, except renal pelvis: Secondary | ICD-10-CM

## 2016-06-19 DIAGNOSIS — D696 Thrombocytopenia, unspecified: Secondary | ICD-10-CM

## 2016-06-19 LAB — CBC & DIFF AND RETIC
BASO%: 0.2 % (ref 0.0–2.0)
Basophils Absolute: 0 10*3/uL (ref 0.0–0.1)
EOS%: 1.3 % (ref 0.0–7.0)
Eosinophils Absolute: 0.1 10*3/uL (ref 0.0–0.5)
HCT: 38.3 % — ABNORMAL LOW (ref 38.4–49.9)
HEMOGLOBIN: 13.5 g/dL (ref 13.0–17.1)
Immature Retic Fract: 6.6 % (ref 3.00–10.60)
LYMPH%: 33.5 % (ref 14.0–49.0)
MCH: 37.9 pg — AB (ref 27.2–33.4)
MCHC: 35.2 g/dL (ref 32.0–36.0)
MCV: 107.6 fL — AB (ref 79.3–98.0)
MONO#: 0.6 10*3/uL (ref 0.1–0.9)
MONO%: 12.4 % (ref 0.0–14.0)
NEUT%: 52.6 % (ref 39.0–75.0)
NEUTROS ABS: 2.4 10*3/uL (ref 1.5–6.5)
PLATELETS: 206 10*3/uL (ref 140–400)
RBC: 3.56 10*6/uL — AB (ref 4.20–5.82)
RDW: 13.3 % (ref 11.0–14.6)
Retic %: 2.34 % — ABNORMAL HIGH (ref 0.80–1.80)
Retic Ct Abs: 83.3 10*3/uL (ref 34.80–93.90)
WBC: 4.6 10*3/uL (ref 4.0–10.3)
lymph#: 1.5 10*3/uL (ref 0.9–3.3)

## 2016-06-19 LAB — COMPREHENSIVE METABOLIC PANEL
ALBUMIN: 3.5 g/dL (ref 3.5–5.0)
ALK PHOS: 101 U/L (ref 40–150)
ALT: 38 U/L (ref 0–55)
ANION GAP: 9 meq/L (ref 3–11)
AST: 32 U/L (ref 5–34)
BUN: 17.8 mg/dL (ref 7.0–26.0)
CO2: 27 mEq/L (ref 22–29)
Calcium: 9.3 mg/dL (ref 8.4–10.4)
Chloride: 107 mEq/L (ref 98–109)
Creatinine: 1.5 mg/dL — ABNORMAL HIGH (ref 0.7–1.3)
EGFR: 46 mL/min/{1.73_m2} — AB (ref >90)
GLUCOSE: 130 mg/dL (ref 70–140)
POTASSIUM: 3.6 meq/L (ref 3.5–5.1)
SODIUM: 143 meq/L (ref 136–145)
Total Bilirubin: 1.12 mg/dL (ref 0.20–1.20)
Total Protein: 6 g/dL — ABNORMAL LOW (ref 6.4–8.3)

## 2016-06-19 LAB — LACTATE DEHYDROGENASE: LDH: 205 U/L (ref 125–245)

## 2016-06-19 NOTE — Progress Notes (Signed)
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 2) Remote history of metastatic renal cell carcinoma Diagnosed with renal cell carcinoma 20 years ago and had a right nephrectomy. About 8 years after that he was noted to have abdominal recurrence in his pancreas spleen and small intestine and had a Whipple's procedure and significant abdominal surgery and notes that 10 out of 17 lymph nodes were positive. He also had his gallbladder removed. Postoperative course was complicated by an internal hemorrhage as per his report. Patient notes 2-3 years after that he had recurrence in his thyroid that led to a thyroidectomy. [June 2004] later he had partial gastrectomy for local recurrence. [February 2005] when he presented with GI bleeding. Patient notes that he has had no known evidence of recurrence over the last 8 years until his recent CT scan showed lung nodules.  Current Treatment: Sutent 37.5 mg po daily for 2 weeks on and 1 weeks off. S/p 8 cycles  SBRT to dominant RML nodule.  Previous treatment Sutent on cycle 1 - 50 mg by mouth daily for 4 weeks on and 2-weeks off. He was dose adjusted to 37.5 mg by mouth daily for 2 weeks with 1 week of to help mitigate issues with fatigue, mild hyperbilirubinemia, cytopenias.  INTERVAL HISTORY:  [name] is here for for his scheduled follow-up after having completed his 8th cycle of Sutent. Notes no acute new symptoms.  He still has some grade 1 fatigue and grade 1 dysguesia with the Sutent but is in good spirits and has been maintaining his weight. No significant GI symptoms. No fevers or chills. Breathing has been stable with no new shortness of breath.  No new chest pain.  No new  focal neurological deficits or headaches.  No fevers or chills or other signs of infection. Diabetes and been reasonably controlled.  Wife is present with him for this visit. He prefers to have his PET/CT for re-evaluation after the 1st of the year since he it will cost him less. He is keen to dress up as Gaylyn Rong and spread the [place] wishes and notes that he has a great [employer] and has been growing out his beard for this.  REVIEW OF SYSTEMS:    10 Point review of systems of done and is negative except as noted above.   Past Medical History:  Diagnosis Date  . Arthritis   . Cancer [facility])    Renal cell  . DDD (degenerative disc disease), cervical 05/28/2016  . DDD (degenerative disc disease), lumbar 05/28/2016  . Depression   . Diabetes ([place])   . Hyperlipidemia 05/28/2016  . Hypertension   . Hypothyroidism   . 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 Kidney disease   . Nephrolithiasis   . Osteoarthritis of both hands 05/28/2016  . Osteoarthritis of both knees 05/28/2016  . Peripheral vascular disorder ([place]) 05/28/2016  . 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 Rheumatoid arthritis ([place])   . Thyroid cancer ([place]) 05/28/2016    . Past Surgical History:  Procedure Laterality Date  . CATARACT EXTRACTION Bilateral 2013  . CHOLECYSTECTOMY    . GASTRECTOMY     tumor removed  . LIPOMA EXCISION Right 1999  . NEPHRECTOMY Right   . PANCREATECTOMY    .  SPLENECTOMY    . THYROIDECTOMY    . URETERAL STENT PLACEMENT Left 2012  . WHIPPLE PROCEDURE      . Social History  Substance Use Topics  . Smoking status: Never Smoker  . Smokeless tobacco: Never Used  . Alcohol use Yes     Comment: occasional beer    ALLERGIES:  has No Known Allergies.  MEDICATIONS:  Current Outpatient Prescriptions  Medication Sig Dispense Refill  . amLODipine (NORVASC) 10 MG tablet Take 1 tablet (10 mg total) by mouth daily. 90 tablet 3  .  Ascorbic Acid (VITAMIN C) 1000 MG tablet Take 1,000 mg by mouth daily.    Marland Kitchen aspirin 81 MG tablet Take 81 mg by mouth daily.    Marland Kitchen atenolol (TENORMIN) 100 MG tablet     . augmented betamethasone dipropionate (DIPROLENE-AF) 0.05 % cream   0  . calcium citrate-vitamin D (CITRACAL+D) 315-200 MG-UNIT per tablet Take 2 tablets by mouth daily.     . Cyanocobalamin (VITAMIN B-12) 2500 MCG SUBL Take by mouth daily.     Marland Kitchen docusate sodium (COLACE) 100 MG capsule Take 100 mg by mouth 2 (two) times daily.    . dorzolamide-timolol (COSOPT) 22.3-6.8 MG/ML ophthalmic solution place 1 drop into affected eye twice a day  1  . famotidine (PEPCID) 20 MG tablet Take 20 mg by mouth 2 (two) times daily.    . ferrous sulfate 325 (65 FE) MG tablet Take 325 mg by mouth 2 (two) times daily with a meal.     . Glucosamine-MSM-Hyaluronic Acd (JOINT HEALTH) 750-375-30 MG TABS Take 2 tablets by mouth daily.    Marland Kitchen HYDROcodone-acetaminophen (NORCO/VICODIN) 5-325 MG tablet Take 1-2 tablets by mouth every 4 (four) hours as needed for moderate pain.    . hydroxychloroquine (PLAQUENIL) 200 MG tablet   0  . insulin lispro (HUMALOG) 100 UNIT/ML injection Inject into the skin 3 (three) times daily before meals.    . Iron-Vitamins (GERITOL COMPLETE) TABS Take 1 tablet by mouth daily.    . Lactobacillus (ACIDOPHILUS) CAPS capsule Take 1 capsule by mouth daily.    Marland Kitchen latanoprost (XALATAN) 0.005 % ophthalmic solution place 1 drop into both eyes every evening  1  . LEVEMIR 100 UNIT/ML injection Inject 7 Units into the skin 2 (two) times daily.   1  . levothyroxine (SYNTHROID, LEVOTHROID) 300 MCG tablet     . lovastatin (MEVACOR) 40 MG tablet     . minoxidil (LONITEN) 10 MG tablet     . Multiple Vitamin (MULTIVITAMIN) tablet Take 1 tablet by mouth daily.    . Omega-3 Fatty Acids (FISH OIL) 1200 MG CAPS Take 1 capsule by mouth daily.    . Pancrelipase, Lip-Prot-Amyl, (CREON) 6000 UNITS CPEP Take 4 capsules by mouth 3 (three) times daily.    .  potassium citrate (UROCIT-K) 10 MEQ (1080 MG) SR tablet     . SUNItinib (SUTENT) 37.5 MG capsule Take 1 tablet (37.5 mg) by mouth daily for 2 wks followed by 1 wk rest 28 capsule 1  . traMADol (ULTRAM) 50 MG tablet Take by mouth as needed.    . valsartan-hydrochlorothiazide (DIOVAN-HCT) 160-12.5 MG per tablet     . VOLTAREN 1 % GEL apply 3 grams to LARGE JOINTS three times a day if needed  0   No current facility-administered medications for this visit.     PHYSICAL EXAMINATION: ECOG PERFORMANCE STATUS: 1 - Symptomatic but completely ambulatory  . Vitals:   06/19/16 0917  BP: 135/71  Pulse: (!) 45  Resp: 18  Temp: 98 F (36.7 C)    Filed Weights   06/19/16 0917  Weight: 191 lb 11.2 oz (87 kg)   .Body mass index is 27.9 kg/m.  GENERAL:alert, in no acute distress and comfortable SKIN: skin color, texture, turgor are normal, no rashes or significant lesions EYES: normal, conjunctiva are pink and non-injected, sclera clear OROPHARYNX:no exudate, no erythema and lips, buccal mucosa, and tongue normal  NECK: supple, no JVD, thyroid normal size, non-tender, without nodularity LYMPH:  no palpable lymphadenopathy in the cervical, axillary or inguinal LUNGS: clear to auscultation with normal respiratory effort HEART: regular rate & rhythm,  no murmurs and no lower extremity edema ABDOMEN: abdomen soft, non-tender, normoactive bowel sounds  Musculoskeletal: no cyanosis of digits and no clubbing  PSYCH: alert & oriented x 3 with fluent speech NEURO: no focal motor/sensory deficits  LABORATORY DATA:   I have reviewed the data as listed  . CBC Latest Ref Rng & Units 06/19/2016 05/08/2016 03/27/2016  WBC 4.0 - 10.3 10e3/uL 4.6 4.3 4.5  Hemoglobin 13.0 - 17.1 g/dL 13.5 12.5(L) 12.6(L)  Hematocrit 38.4 - 49.9 % 38.3(L) 36.7(L) 36.4(L)  Platelets 140 - 400 10e3/uL 206 205 210   . CBC    Component Value Date/Time   WBC 4.6 06/19/2016 0856   WBC 7.3 06/13/2015 0915   RBC 3.56 (L)  06/19/2016 0856   RBC 4.36 06/13/2015 0915   HGB 13.5 06/19/2016 0856   HCT 38.3 (L) 06/19/2016 0856   PLT 206 06/19/2016 0856   MCV 107.6 (H) 06/19/2016 0856   MCH 37.9 (H) 06/19/2016 0856   MCH 33.9 06/13/2015 0915   MCHC 35.2 06/19/2016 0856   MCHC 34.3 06/13/2015 0915   RDW 13.3 06/19/2016 0856   LYMPHSABS 1.5 06/19/2016 0856   MONOABS 0.6 06/19/2016 0856   EOSABS 0.1 06/19/2016 0856   BASOSABS 0.0 06/19/2016 0856   . CMP Latest Ref Rng & Units 06/19/2016 05/08/2016 03/27/2016  Glucose 70 - 140 mg/dl 130 228(H) 174(H)  BUN 7.0 - 26.0 mg/dL 17.8 13.5 16.0  Creatinine 0.7 - 1.3 mg/dL 1.5(H) 1.4(H) 1.9(H)  Sodium 136 - 145 mEq/L 143 140 141  Potassium 3.5 - 5.1 mEq/L 3.6 4.5 4.5  Chloride 101 - 111 mmol/L - - -  CO2 22 - 29 mEq/L 27 28 25   Calcium 8.4 - 10.4 mg/dL 9.3 8.9 9.0  Total Protein 6.4 - 8.3 g/dL 6.0(L) 5.7(L) 5.6(L)  Total Bilirubin 0.20 - 1.20 mg/dL 1.12 1.17 1.24(H)  Alkaline Phos 40 - 150 U/L 101 90 97  AST 5 - 34 U/L 32 30 37(H)  ALT 0 - 55 U/L 38 25 30      RADIOGRAPHIC STUDIES: I have personally reviewed the radiological images as listed and agreed with the findings in the report. No results found.   ASSESSMENT & PLAN:  65 year old Caucasian male with  #1 Remote history of metastatic renal cell carcinoma status post right-sided nephrectomy with multiple recurrences treated with surgical excision as detailed above. He has been NED status for the last 8 years. He stopped following up with the cancer Center due to concerns about medical costs. He has been referred back to [place] by Dr. Sharlett Iles after recent CT of the chest showed multiple lung nodules concerning for neoplastic etiology and was noted to have recurrent metastatic RCC.  #2 Recurrent metastatic Renal cell carcinoma with multiple lung metastases. Patient is status post Sutent 8 cycles. Has had SBRT to the dominant right  lung nodule with partial response. PET/CT scan from 12/27/2015 shows no evidence  of disease progression. PET/CT 03/20/2016 - shows some radiation pneumonitis no new pulmonary nodules. Subtle uptake in the ribs which could be from trauma/bone change due to chronic kidney disease. Though bone metastases cannot be ruled out findings are not definitive enough to warrant change in treatment at this time. No other evidence of metastatic RCC progression at this time.  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 Confirmed by [place].  #4 mild thrombocytopenia likely related to Sutent- resolved with dose reduction. #5 elevation in bilirubin levels likely due to Sutent. Bilirubin stable .  #6 grade 1-2 fatigue due to Sutent -better with dose reduction and changing schedule to 2 week on and 1 week. #7 Dysguesia from Sutent - manageable per patient. He is trying to eat as well as he can and is maintaining his body weight. #8 RBC macrocytosis due to Sutent 9 chronic kidney disease/single kidney -creatinine stable at 1.4.Marland KitchenMarland Kitchenwill need to monitor this. #10 rheumatoid arthritis -follows with Dr. Estanislado Pandy for mx Plan -Patient has no clinical evidence of disease progression at this time.  - No prohibitive toxicities from Sutent at this time that would indicate medication dose changes. -Continue current dose of Sutent with close monitoring of labs. --Continue follow-up with primary care physician and rheumatology.  RTC with Dr Irene Limbo in 1st week of Jan 2018 with labs PET/CT prior to appointment in 1st week of Jan 2018  I spent 20 minutes counseling the patient face to face. The total time spent in the appointment was 25 minutes and more than 50% was on counseling and direct patient cares.    Sullivan Lone MD [place] AAHIVMS [facility] Hematology/Oncology Physician [facility]  (Office):       [phone] (Work cell):  [phone] (Fax):           [phone]

## 2016-06-19 NOTE — Telephone Encounter (Signed)
Gave patient avs report and appointments for January. Central radiology will call re scan.  °

## 2016-06-23 NOTE — Telephone Encounter (Signed)
Oral Chemotherapy Pharmacist Encounter  Completed application for [employer] patient Assistance faxed to [phone] on 06/19/16 Oral Chemo Clinic will continue to follow.  Johny Drilling, PharmD, BCPS 06/23/2016  10:22 AM Oral Chemotherapy Clinic [phone]

## 2016-06-30 ENCOUNTER — Ambulatory Visit (HOSPITAL_COMMUNITY): Payer: Medicare Other

## 2016-06-30 ENCOUNTER — Ambulatory Visit: Payer: Medicare Other

## 2016-07-01 ENCOUNTER — Ambulatory Visit: Payer: Self-pay | Admitting: Radiation Oncology

## 2016-07-01 ENCOUNTER — Telehealth: Payer: Self-pay | Admitting: Radiation Oncology

## 2016-07-01 NOTE — Telephone Encounter (Signed)
LM for pt to make sure he wasn't planning on coming in today. It looks like his appointment in [place] was canceled but not in [place]. I'm happy to see him and let him know that I would be able to see him today if he likes, but would recommend we postpone otherwise until he has his next scan in January 2018.

## 2016-07-03 ENCOUNTER — Ambulatory Visit: Payer: Self-pay | Admitting: Rheumatology

## 2016-07-24 DIAGNOSIS — Z8639 Personal history of other endocrine, nutritional and metabolic disease: Secondary | ICD-10-CM | POA: Insufficient documentation

## 2016-07-24 DIAGNOSIS — Z8719 Personal history of other diseases of the digestive system: Secondary | ICD-10-CM | POA: Insufficient documentation

## 2016-07-24 DIAGNOSIS — R7612 Nonspecific reaction to cell mediated immunity measurement of gamma interferon antigen response without active tuberculosis: Secondary | ICD-10-CM | POA: Insufficient documentation

## 2016-07-24 NOTE — Progress Notes (Signed)
Office Visit Note  Patient: Dylan Jefferson             Date of Birth: 08-May-1951           MRN: [id number]             PCP: [name]  Referring: No ref. provider found Visit Date: 07/25/2016 Occupation: Retired    Subjective:  Joint stiffness   History of Present Illness: Dylan Jefferson is a 65 y.o. male with history of inflammatory arthritis. According to him he is been doing fairly well. He denies any joint swelling. He states he has occasional discomfort in his knee joints and lower back after doing certain activities.  Activities of Daily Living:  Patient reports morning stiffness for 5 minutes.   Patient Denies nocturnal pain.  Difficulty dressing/grooming: Denies Difficulty climbing stairs: Denies Difficulty getting out of chair: Denies Difficulty using hands for taps, buttons, cutlery, and/or writing: Denies   Review of Systems  Constitutional: Positive for fatigue. Negative for night sweats and weakness ( ).  HENT: Negative for mouth sores, mouth dryness and nose dryness.   Eyes: Positive for dryness. Negative for redness.  Respiratory: Negative for shortness of breath and difficulty breathing.   Cardiovascular: Negative for chest pain, palpitations, hypertension, irregular heartbeat and swelling in legs/feet.  Gastrointestinal: Negative for constipation and diarrhea.  Endocrine: Negative for increased urination.  Musculoskeletal: Positive for arthralgias, joint pain and morning stiffness. Negative for joint swelling, myalgias, muscle weakness, muscle tenderness and myalgias.  Skin: Negative for color change, rash, hair loss, nodules/bumps, skin tightness, ulcers and sensitivity to sunlight.  Allergic/Immunologic: Negative for susceptible to infections.  Neurological: Negative for dizziness, fainting, memory loss and night sweats.  Hematological: Negative for swollen glands.  Psychiatric/Behavioral: Negative for depressed mood and sleep disturbance. The  patient is nervous/anxious.     PMFS History:  Patient Active Problem List   Diagnosis Date Noted  . 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 History of diabetes mellitus 07/24/2016  . History of esophagitis 07/24/2016  . Thyroid cancer ([place]) 05/28/2016  . Hyperlipidemia 05/28/2016  . Inflammatory polyarthritis ([place]) 05/28/2016  . DDD (degenerative disc disease), cervical 05/28/2016  . DDD (degenerative disc disease), lumbar 05/28/2016  . Osteoarthritis of both knees 05/28/2016  . Osteoarthritis of both hands 05/28/2016  . Peripheral vascular disorder ([place]) 05/28/2016  . 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 Nodule of finger of left hand 10/11/2015  . Lung metastases ([place]) 09/06/2015  . Fatigue 08/16/2015  . Hyperbilirubinemia 08/16/2015  . Renal insufficiency 08/09/2015  . Encounter for chemotherapy management 07/16/2015  . 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 Personal history of renal cell cancer 03/29/2014  . Special screening for malignant neoplasms, colon 03/29/2014  . Esophageal varices with bleeding ([place]) 03/29/2014  . RENAL CELL CANCER, METASTATIC 12/04/2007  . Hypothyroidism 12/04/2007  . Diabetes mellitus ([place]) 12/04/2007  . HTN (hypertension) 12/04/2007  . ESOPHAGITIS 12/04/2007  . HEMOCCULT POSITIVE STOOL 12/04/2007  . Inflammatory arthritis 12/04/2007  . HEADACHE, CHRONIC 12/04/2007    Past Medical History:  Diagnosis Date  . Arthritis   . Cancer [facility])    Renal cell  . DDD (degenerative disc disease), cervical 05/28/2016  . DDD (degenerative disc disease), lumbar 05/28/2016  . Depression   . Diabetes ([place])   . Hyperlipidemia 05/28/2016  . Hypertension   . Hypothyroidism   . Inflammatory polyarthritis ([place]) 05/28/2016   Sero Negative, Ultrasound positive synovitis   . Kidney disease   .  Nephrolithiasis   . Osteoarthritis of both hands 05/28/2016  . Osteoarthritis of both knees 05/28/2016  . Peripheral vascular disorder ([place])  05/28/2016  . 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 Rheumatoid arthritis ([place])   . Thyroid cancer ([place]) 05/28/2016    Family History  Problem Relation Age of Onset  . Bleeding Disorder Mother     Unknown what type  . Osteoporosis Mother   . Lymphoma Father     Hodgkin's lymphoma  . Colon cancer Neg Hx    Past Surgical History:  Procedure Laterality Date  . CATARACT EXTRACTION Bilateral 2013  . CHOLECYSTECTOMY    . GASTRECTOMY     tumor removed  . LIPOMA EXCISION Right 1999  . NEPHRECTOMY Right   . PANCREATECTOMY    . SPLENECTOMY    . THYROIDECTOMY    . URETERAL STENT PLACEMENT Left 2012  . WHIPPLE PROCEDURE     Social History   Social History Narrative  . No narrative on file     Objective: Vital Signs: BP (!) 160/82 (BP Location: Right Arm, Patient Position: Sitting, Cuff Size: Large)   Pulse (!) 52   Resp 14   Wt 189 lb (85.7 kg)   BMI 27.51 kg/m    Physical Exam  Constitutional: He is oriented to person, place, and time. He appears well-developed and well-nourished.  HENT:  Head: Normocephalic and atraumatic.  Eyes: Conjunctivae and EOM are normal. Pupils are equal, round, and reactive to light.  Neck: Normal range of motion. Neck supple.  Cardiovascular: Normal rate, regular rhythm and normal heart sounds.   Pulmonary/Chest: Effort normal and breath sounds normal.  Abdominal: Soft. Bowel sounds are normal.  Neurological: He is alert and oriented to person, place, and time.  Skin: Skin is warm and dry. Capillary refill takes less than 2 seconds.  Psychiatric: He has a normal mood and affect. His behavior is normal.  Nursing note and vitals reviewed.    Musculoskeletal Exam: C-spine good range of motion, thoracic spine good range of motion, lumbar spine good range of motion with the stiffness over SI joints. Shoulder joints good range of motion he is about 20 contracture in his right elbow which is old his joints are good range of motion. He has some thickening  of PIP/DIP joints in his hands. Hip joints knee joints ankles MTPs PIPs with good range of motion with no synovitis.  CDAI Exam: CDAI Homunculus Exam:   Joint Counts:  CDAI Tender Joint count: 0 CDAI Swollen Joint count: 0  Global Assessments:  Patient Global Assessment: 1 Provider Global Assessment: 1  CDAI Calculated Score: 2    Investigation: Findings:  11/08/2015 normal Plaquenil eye exam 04/2015 positive TB gold lab test, patient referred to PCP for treatment negative HIV 06/19/2016 CBC normal, CMP GFR 46  Appointment on 06/19/2016  Component Date Value Ref Range Status  . WBC 06/19/2016 4.6  4.0 - 10.3 10e3/uL Final  . NEUT# 06/19/2016 2.4  1.5 - 6.5 10e3/uL Final  . HGB 06/19/2016 13.5  13.0 - 17.1 g/dL Final  . HCT 06/19/2016 38.3* 38.4 - 49.9 % Final  . Platelets 06/19/2016 206  140 - 400 10e3/uL Final  . MCV 06/19/2016 107.6* 79.3 - 98.0 fL Final  . MCH 06/19/2016 37.9* 27.2 - 33.4 pg Final  . MCHC 06/19/2016 35.2  32.0 - 36.0 g/dL Final  . RBC 06/19/2016 3.56* 4.20 - 5.82 10e6/uL Final  . RDW 06/19/2016 13.3  11.0 - 14.6 % Final  . lymph# 06/19/2016  1.5  0.9 - 3.3 10e3/uL Final  . MONO# 06/19/2016 0.6  0.1 - 0.9 10e3/uL Final  . Eosinophils Absolute 06/19/2016 0.1  0.0 - 0.5 10e3/uL Final  . Basophils Absolute 06/19/2016 0.0  0.0 - 0.1 10e3/uL Final  . NEUT% 06/19/2016 52.6  39.0 - 75.0 % Final  . LYMPH% 06/19/2016 33.5  14.0 - 49.0 % Final  . MONO% 06/19/2016 12.4  0.0 - 14.0 % Final  . EOS% 06/19/2016 1.3  0.0 - 7.0 % Final  . BASO% 06/19/2016 0.2  0.0 - 2.0 % Final  . Retic % 06/19/2016 2.34* 0.80 - 1.80 % Final  . Retic Ct Abs 06/19/2016 83.30  34.80 - 93.90 10e3/uL Final  . Immature Retic Fract 06/19/2016 6.60  3.00 - 10.60 % Final  . Sodium 06/19/2016 143  136 - 145 mEq/L Final  . Potassium 06/19/2016 3.6  3.5 - 5.1 mEq/L Final  . Chloride 06/19/2016 107  98 - 109 mEq/L Final  . CO2 06/19/2016 27  22 - 29 mEq/L Final  . Glucose 06/19/2016 130  70 -  140 mg/dl Final  . BUN 06/19/2016 17.8  7.0 - 26.0 mg/dL Final  . Creatinine 06/19/2016 1.5* 0.7 - 1.3 mg/dL Final  . Total Bilirubin 06/19/2016 1.12  0.20 - 1.20 mg/dL Final  . Alkaline Phosphatase 06/19/2016 101  40 - 150 U/L Final  . AST 06/19/2016 32  5 - 34 U/L Final  . ALT 06/19/2016 38  0 - 55 U/L Final  . Total Protein 06/19/2016 6.0* 6.4 - 8.3 g/dL Final  . Albumin 06/19/2016 3.5  3.5 - 5.0 g/dL Final  . Calcium 06/19/2016 9.3  8.4 - 10.4 mg/dL Final  . Anion Gap 06/19/2016 9  3 - 11 mEq/L Final  . EGFR 06/19/2016 46* >90 ml/min/1.73 m2 Final  . LDH 06/19/2016 205  125 - 245 U/L Final     Imaging: No results found.  Speciality Comments: No specialty comments available.    Procedures:  No procedures performed Allergies: Patient has no known allergies.   Assessment / Plan:     Visit Diagnoses: Inflammatory arthritis - sero negative, positive synovitis on [place]. He's been doing really well on Plaquenil with no synovitis on examination. He has minimal morning stiffness in the morning.  High risk medication use - Plaquenil eye exam 11/08/2015. His GFR is low. I would reduce his Plaquenil dose  to 300 mg a day. He's been getting his labs to oncology frequently him I'll check labs if needed in 5 months.  Right elbow joint contracture: Chronic with no change  Primary osteoarthritis of both hands: Joint protection and muscle strengthening was discussed.  Primary osteoarthritis of both knees - moderate: He has minimal discomfort currently.  DDD (degenerative disc disease), lumbar: Lumbar stretching exercises discussed.  DDD (degenerative disc disease), cervical: Doing fairly well.  He has multiple other medical problems which are listed as below:  Personal history of renal cell cancer - Status post right nephrectomy, total pancreatectomy, splenectomy, partial gastrectomy, gastrojejunostomy, cholecystectomy  Metastatic disease to the lungs  Renal insufficiency  History of  diabetes mellitus  History of esophagitis  Positive QuantiFERON-TB Gold test  Thyroid cancer ([place]) - Status post thyroidectomy  Renal calcinosis  Peripheral vascular disorder ([facility])  Mixed hyperlipidemia  Essential hypertension  Type 1 diabetes mellitus with complication ([place])    Orders: Orders Placed This Encounter  Procedures  . CBC with Differential/Platelet  . COMPLETE METABOLIC PANEL WITH GFR   Meds ordered this  encounter  Medications  . DISCONTD: hydroxychloroquine (PLAQUENIL) 200 MG tablet    Sig: 248m po qAM and 1070mpo qPM    Dispense:  135 tablet    Refill:  1  . hydroxychloroquine (PLAQUENIL) 200 MG tablet    Sig: 20046mo qAM and 100m14m qPM    Dispense:  135 tablet    Refill:  1    Face-to-face time spent with patient was 30 minutes. 50% of time was spent in counseling and coordination of care.  Follow-Up Instructions: Return in about 5 months (around 12/23/2016) for Inflammatory arthritis.   ShaiBo Merino

## 2016-07-25 ENCOUNTER — Encounter: Payer: Self-pay | Admitting: Rheumatology

## 2016-07-25 ENCOUNTER — Ambulatory Visit (INDEPENDENT_AMBULATORY_CARE_PROVIDER_SITE_OTHER): Payer: Medicare Other | Admitting: Rheumatology

## 2016-07-25 VITALS — BP 160/82 | HR 52 | Resp 14 | Wt 189.0 lb

## 2016-07-25 DIAGNOSIS — I739 Peripheral vascular disease, unspecified: Secondary | ICD-10-CM

## 2016-07-25 DIAGNOSIS — Z8639 Personal history of other endocrine, nutritional and metabolic disease: Secondary | ICD-10-CM | POA: Diagnosis not present

## 2016-07-25 DIAGNOSIS — C7801 Secondary malignant neoplasm of right lung: Secondary | ICD-10-CM | POA: Diagnosis not present

## 2016-07-25 DIAGNOSIS — N289 Disorder of kidney and ureter, unspecified: Secondary | ICD-10-CM

## 2016-07-25 DIAGNOSIS — E108 Type 1 diabetes mellitus with unspecified complications: Secondary | ICD-10-CM

## 2016-07-25 DIAGNOSIS — M19042 Primary osteoarthritis, left hand: Secondary | ICD-10-CM

## 2016-07-25 DIAGNOSIS — M170 Bilateral primary osteoarthritis of knee: Secondary | ICD-10-CM

## 2016-07-25 DIAGNOSIS — N29 Other disorders of kidney and ureter in diseases classified elsewhere: Secondary | ICD-10-CM

## 2016-07-25 DIAGNOSIS — M5136 Other intervertebral disc degeneration, lumbar region: Secondary | ICD-10-CM | POA: Diagnosis not present

## 2016-07-25 DIAGNOSIS — Z79899 Other long term (current) drug therapy: Secondary | ICD-10-CM

## 2016-07-25 DIAGNOSIS — Z85528 Personal history of other malignant neoplasm of kidney: Secondary | ICD-10-CM | POA: Diagnosis not present

## 2016-07-25 DIAGNOSIS — I10 Essential (primary) hypertension: Secondary | ICD-10-CM

## 2016-07-25 DIAGNOSIS — M51369 Other intervertebral disc degeneration, lumbar region without mention of lumbar back pain or lower extremity pain: Secondary | ICD-10-CM

## 2016-07-25 DIAGNOSIS — Z8719 Personal history of other diseases of the digestive system: Secondary | ICD-10-CM

## 2016-07-25 DIAGNOSIS — R7612 Nonspecific reaction to cell mediated immunity measurement of gamma interferon antigen response without active tuberculosis: Secondary | ICD-10-CM | POA: Diagnosis not present

## 2016-07-25 DIAGNOSIS — C73 Malignant neoplasm of thyroid gland: Secondary | ICD-10-CM

## 2016-07-25 DIAGNOSIS — M1990 Unspecified osteoarthritis, unspecified site: Secondary | ICD-10-CM | POA: Diagnosis not present

## 2016-07-25 DIAGNOSIS — E782 Mixed hyperlipidemia: Secondary | ICD-10-CM

## 2016-07-25 DIAGNOSIS — M5030 Other cervical disc degeneration, unspecified cervical region: Secondary | ICD-10-CM

## 2016-07-25 DIAGNOSIS — M19041 Primary osteoarthritis, right hand: Secondary | ICD-10-CM

## 2016-07-25 MED ORDER — HYDROXYCHLOROQUINE SULFATE 200 MG PO TABS: ORAL_TABLET | ORAL | 1 refills | Status: DC

## 2016-07-25 NOTE — Progress Notes (Signed)
Rheumatology Medication Review by a Pharmacist Does the patient feel that his/her medications are working for him/her?  Yes Has the patient been experiencing any side effects to the medications prescribed?  No Does the patient have any problems obtaining medications?  No  Issues to address at subsequent visits: None   Pharmacist comments:  Dylan Jefferson is a pleasant 65 yo M who presents for follow up of his sero-negative inflammatory arthritis.  Patient is currently taking hydroxychloroquine 200 mg twice daily.  He had a CBC and CMP on 06/19/16.  CMP showed Cr of 1.5 and GFR of 46.  CBC showed Hct of 38.3.  Patient reports his most recent hydroxychloroquine eye exam was on 07/07/16.  He reports the exam was normal and he asked the eye center to fax [place] those results yesterday.  Patient requests refill of hydroxychloroquine.  He denies any other medication related questions at this time.    Elisabeth Most, Pharm.D., BCPS Clinical Pharmacist Pager: [phone] Phone: [phone] 07/25/2016 8:28 AM

## 2016-07-25 NOTE — Patient Instructions (Signed)
Standing Labs We placed an order today for your standing lab work.    Please come back and get your standing labs in April 2018 if you have not had recent labs by Dr. Irene Limbo.    We have open lab Monday through Friday from 8:30-11:30 AM and 1:30-4 PM at the office of Dr. Tresa Moore, PA.   The office is located at [address], [place], [place], [place] [address] No appointment is necessary.   Labs are drawn by [facility].  You may receive a bill from [facility] for your lab work.

## 2016-08-07 ENCOUNTER — Ambulatory Visit: Payer: Medicare Other | Admitting: Hematology

## 2016-08-07 ENCOUNTER — Other Ambulatory Visit: Payer: Medicare Other

## 2016-08-08 ENCOUNTER — Ambulatory Visit (HOSPITAL_COMMUNITY)
Admission: RE | Admit: 2016-08-08 | Discharge: 2016-08-08 | Disposition: A | Discharge status: Home / Self Care | Payer: Medicare Other | Source: Ambulatory Visit | Attending: Hematology | Admitting: Hematology

## 2016-08-08 DIAGNOSIS — N200 Calculus of kidney: Secondary | ICD-10-CM | POA: Diagnosis not present

## 2016-08-08 DIAGNOSIS — R222 Localized swelling, mass and lump, trunk: Secondary | ICD-10-CM | POA: Diagnosis not present

## 2016-08-08 DIAGNOSIS — I700 Atherosclerosis of aorta: Secondary | ICD-10-CM | POA: Diagnosis not present

## 2016-08-08 DIAGNOSIS — R918 Other nonspecific abnormal finding of lung field: Secondary | ICD-10-CM | POA: Diagnosis not present

## 2016-08-08 DIAGNOSIS — I2510 Atherosclerotic heart disease of native coronary artery without angina pectoris: Secondary | ICD-10-CM | POA: Diagnosis not present

## 2016-08-08 DIAGNOSIS — X58XXXD Exposure to other specified factors, subsequent encounter: Secondary | ICD-10-CM | POA: Diagnosis not present

## 2016-08-08 DIAGNOSIS — C7800 Secondary malignant neoplasm of unspecified lung: Secondary | ICD-10-CM | POA: Diagnosis not present

## 2016-08-08 DIAGNOSIS — C649 Malignant neoplasm of unspecified kidney, except renal pelvis: Secondary | ICD-10-CM

## 2016-08-08 DIAGNOSIS — S2239XD Fracture of one rib, unspecified side, subsequent encounter for fracture with routine healing: Secondary | ICD-10-CM | POA: Insufficient documentation

## 2016-08-08 LAB — GLUCOSE, CAPILLARY: GLUCOSE-CAPILLARY: 107 mg/dL — AB (ref 65–99)

## 2016-08-08 MED ORDER — FLUDEOXYGLUCOSE F - 18 (FDG) INJECTION
9.5600 | Freq: Once | INTRAVENOUS | Status: AC | PRN
  Administered 2016-08-08: 9.56 via INTRAVENOUS

## 2016-08-14 ENCOUNTER — Encounter: Payer: Self-pay | Admitting: Hematology

## 2016-08-14 ENCOUNTER — Telehealth: Payer: Self-pay | Admitting: Hematology

## 2016-08-14 ENCOUNTER — Other Ambulatory Visit (HOSPITAL_BASED_OUTPATIENT_CLINIC_OR_DEPARTMENT_OTHER): Payer: Medicare Other

## 2016-08-14 ENCOUNTER — Ambulatory Visit (HOSPITAL_BASED_OUTPATIENT_CLINIC_OR_DEPARTMENT_OTHER): Payer: Medicare Other | Admitting: Hematology

## 2016-08-14 ENCOUNTER — Other Ambulatory Visit: Payer: Self-pay | Admitting: *Deleted

## 2016-08-14 VITALS — BP 143/76 | HR 47 | Temp 98.1°F | Resp 18 | Ht 69.5 in | Wt 191.0 lb

## 2016-08-14 DIAGNOSIS — M069 Rheumatoid arthritis, unspecified: Secondary | ICD-10-CM

## 2016-08-14 DIAGNOSIS — C649 Malignant neoplasm of unspecified kidney, except renal pelvis: Secondary | ICD-10-CM

## 2016-08-14 DIAGNOSIS — C7800 Secondary malignant neoplasm of unspecified lung: Secondary | ICD-10-CM | POA: Diagnosis not present

## 2016-08-14 DIAGNOSIS — N189 Chronic kidney disease, unspecified: Secondary | ICD-10-CM | POA: Diagnosis not present

## 2016-08-14 LAB — CBC & DIFF AND RETIC
BASO%: 0.5 % (ref 0.0–2.0)
Basophils Absolute: 0 10*3/uL (ref 0.0–0.1)
EOS%: 2 % (ref 0.0–7.0)
Eosinophils Absolute: 0.1 10*3/uL (ref 0.0–0.5)
HCT: 36.6 % — ABNORMAL LOW (ref 38.4–49.9)
HGB: 12.8 g/dL — ABNORMAL LOW (ref 13.0–17.1)
IMMATURE RETIC FRACT: 11.1 % — AB (ref 3.00–10.60)
LYMPH#: 2.6 10*3/uL (ref 0.9–3.3)
LYMPH%: 63.7 % — ABNORMAL HIGH (ref 14.0–49.0)
MCH: 38 pg — ABNORMAL HIGH (ref 27.2–33.4)
MCHC: 35 g/dL (ref 32.0–36.0)
MCV: 108.6 fL — ABNORMAL HIGH (ref 79.3–98.0)
MONO#: 0.4 10*3/uL (ref 0.1–0.9)
MONO%: 10.9 % (ref 0.0–14.0)
NEUT#: 0.9 10*3/uL — ABNORMAL LOW (ref 1.5–6.5)
NEUT%: 22.9 % — ABNORMAL LOW (ref 39.0–75.0)
Platelets: 204 10*3/uL (ref 140–400)
RBC: 3.37 10*6/uL — AB (ref 4.20–5.82)
RDW: 13.9 % (ref 11.0–14.6)
RETIC %: 5.66 % — AB (ref 0.80–1.80)
RETIC CT ABS: 190.74 10*3/uL — AB (ref 34.80–93.90)
WBC: 4 10*3/uL (ref 4.0–10.3)

## 2016-08-14 LAB — COMPREHENSIVE METABOLIC PANEL
ALT: 31 U/L (ref 0–55)
AST: 33 U/L (ref 5–34)
Albumin: 3.6 g/dL (ref 3.5–5.0)
Alkaline Phosphatase: 86 U/L (ref 40–150)
Anion Gap: 6 mEq/L (ref 3–11)
BUN: 13 mg/dL (ref 7.0–26.0)
CHLORIDE: 109 meq/L (ref 98–109)
CO2: 26 meq/L (ref 22–29)
CREATININE: 1.9 mg/dL — AB (ref 0.7–1.3)
Calcium: 9 mg/dL (ref 8.4–10.4)
EGFR: 37 mL/min/{1.73_m2} — ABNORMAL LOW (ref >90)
GLUCOSE: 134 mg/dL (ref 70–140)
Potassium: 4 mEq/L (ref 3.5–5.1)
SODIUM: 141 meq/L (ref 136–145)
TOTAL PROTEIN: 5.8 g/dL — AB (ref 6.4–8.3)
Total Bilirubin: 0.82 mg/dL (ref 0.20–1.20)

## 2016-08-14 LAB — LACTATE DEHYDROGENASE: LDH: 221 U/L (ref 125–245)

## 2016-08-14 MED ORDER — SUNITINIB MALATE 37.5 MG PO CAPS: ORAL_CAPSULE | ORAL | 1 refills | Status: DC

## 2016-08-14 NOTE — Telephone Encounter (Signed)
Gave patient avs report and appointments for February.  °

## 2016-08-20 NOTE — Telephone Encounter (Signed)
Oral Chemotherapy Pharmacist Encounter  Received notification from [facility] that they have shipped next fill of Sutent to patient. On this fax it also states patient is enrolled into their program to receive Sutent free-of-charge from the manufacturer until 08/03/17.  Any questions for the [facility] program should be directed to [phone]  [facility] will sign off at this time. Please let us know if we can be of assistance in the future.  Johny Drilling, PharmD, BCPS, BCOP 08/20/2016  3:13 PM Oral Oncology Clinic [phone]

## 2016-09-01 NOTE — Progress Notes (Signed)
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 2) Remote history of metastatic renal cell carcinoma Diagnosed with renal cell carcinoma 20 years ago and had a right nephrectomy. About 8 years after that he was noted to have abdominal recurrence in his pancreas spleen and small intestine and had a Whipple's procedure and significant abdominal surgery and notes that 10 out of 17 lymph nodes were positive. He also had his gallbladder removed. Postoperative course was complicated by an internal hemorrhage as per his report. Patient notes 2-3 years after that he had recurrence in his thyroid that led to a thyroidectomy. [June 2004] later he had partial gastrectomy for local recurrence. [February 2005] when he presented with GI bleeding. Patient notes that he has had no known evidence of recurrence over the last 8 years until his recent CT scan showed lung nodules.  Current Treatment: Sutent 37.5 mg po daily for 2 weeks on and 1 weeks off. S/p 10 cycles  SBRT to dominant RML nodule.  Previous treatment Sutent on cycle 1 - 50 mg by mouth daily for 4 weeks on and 2-weeks off. He was dose adjusted to 37.5 mg by mouth daily for 2 weeks with 1 week of to help mitigate issues with fatigue, mild hyperbilirubinemia, cytopenias.  INTERVAL HISTORY:  Dylan Jefferson is here for for his scheduled follow-up after having completed his 10th cycle of Sutent. Notes no acute new symptoms.  He still has some grade 1 fatigue and grade 1 dysguesia with the Sutent but feels that is unchanged. No significant GI symptoms. No fevers or chills. Breathing has been stable with no new shortness of breath.  No new chest pain.  No new focal neurological deficits  or headaches.  ANC a little lower at 0.9. No fevers or chills or other signs of infection. Diabetes and been reasonably controlled.   PET/CT done on 08/08/2016 was discussed in details 2  -- small 5 mm LLL lung nodules mildly increased in size. No other overt evidence of disease progression. Creatinine upto 1.9. He has been on ibuprofen per PCP/rheumatology -- recommended he re-discuss the use of NSAIDS in the setting of his limited renal reserve.   REVIEW OF SYSTEMS:    10 Point review of systems of done and is negative except as noted above.   Past Medical History:  Diagnosis Date  . Arthritis   . Cancer [facility])    Renal cell  . DDD (degenerative disc disease), cervical 05/28/2016  . DDD (degenerative disc disease), lumbar 05/28/2016  . Depression   . Diabetes ([place])   . Hyperlipidemia 05/28/2016  . Hypertension   . Hypothyroidism   . 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 Kidney disease   . Nephrolithiasis   . Osteoarthritis of both hands 05/28/2016  . Osteoarthritis of both knees 05/28/2016  . Peripheral vascular disorder ([place]) 05/28/2016  . 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 Rheumatoid arthritis ([place])   . Thyroid cancer ([place]) 05/28/2016    . Past Surgical History:  Procedure Laterality Date  . CATARACT EXTRACTION Bilateral 2013  . CHOLECYSTECTOMY    . GASTRECTOMY     tumor removed  . LIPOMA EXCISION Right 1999  . NEPHRECTOMY Right   . PANCREATECTOMY    . SPLENECTOMY    .  THYROIDECTOMY    . URETERAL STENT PLACEMENT Left 2012  . WHIPPLE PROCEDURE      . Social History  Substance Use Topics  . Smoking status: Former Smoker    Years: 1.50  . Smokeless tobacco: Never Used  . Alcohol use Yes     Comment: occasional beer    ALLERGIES:  has No Known Allergies.  MEDICATIONS:  Current Outpatient Prescriptions  Medication Sig Dispense Refill  . amLODipine (NORVASC) 10 MG tablet Take 1 tablet (10 mg total) by  mouth daily. 90 tablet 3  . Ascorbic Acid (VITAMIN C) 1000 MG tablet Take 1,000 mg by mouth daily.    Marland Kitchen aspirin 81 MG tablet Take 81 mg by mouth daily.    Marland Kitchen atenolol (TENORMIN) 100 MG tablet     . augmented betamethasone dipropionate (DIPROLENE-AF) 0.05 % cream   0  . calcium citrate-vitamin D (CITRACAL+D) 315-200 MG-UNIT per tablet Take 2 tablets by mouth daily.     . Cyanocobalamin (VITAMIN B-12) 2500 MCG SUBL Take by mouth daily.     Marland Kitchen docusate sodium (COLACE) 100 MG capsule Take 100 mg by mouth 2 (two) times daily.    . dorzolamide-timolol (COSOPT) 22.3-6.8 MG/ML ophthalmic solution place 1 drop into affected eye twice a day  1  . famotidine (PEPCID) 20 MG tablet Take 20 mg by mouth 2 (two) times daily.    . ferrous sulfate 325 (65 FE) MG tablet Take 325 mg by mouth 2 (two) times daily with a meal.     . Glucosamine-MSM-Hyaluronic Acd (JOINT HEALTH) 750-375-30 MG TABS Take 2 tablets by mouth daily.    Marland Kitchen HYDROcodone-acetaminophen (NORCO/VICODIN) 5-325 MG tablet Take 1-2 tablets by mouth every 4 (four) hours as needed for moderate pain.    . hydroxychloroquine (PLAQUENIL) 200 MG tablet 200mg  po qAM and 100mg  po qPM 135 tablet 1  . insulin lispro (HUMALOG) 100 UNIT/ML injection Inject into the skin 3 (three) times daily before meals.    . Iron-Vitamins (GERITOL COMPLETE) TABS Take 1 tablet by mouth daily.    . Lactobacillus (ACIDOPHILUS) CAPS capsule Take 1 capsule by mouth daily.    Marland Kitchen latanoprost (XALATAN) 0.005 % ophthalmic solution place 1 drop into both eyes every evening  1  . LEVEMIR 100 UNIT/ML injection Inject 7 Units into the skin 2 (two) times daily.   1  . levothyroxine (SYNTHROID, LEVOTHROID) 300 MCG tablet Take 300 mcg by mouth 3 (three) times a week.     . lovastatin (MEVACOR) 40 MG tablet Take 40 mg by mouth at bedtime.     . minoxidil (LONITEN) 10 MG tablet Take 10 mg by mouth 2 (two) times daily.     . Multiple Vitamin (MULTIVITAMIN) tablet Take 1 tablet by mouth daily.    .  Omega-3 Fatty Acids (FISH OIL) 1200 MG CAPS Take 1 capsule by mouth daily.    . Pancrelipase, Lip-Prot-Amyl, (CREON) 6000 UNITS CPEP Take 4 capsules by mouth 3 (three) times daily.    . potassium citrate (UROCIT-K) 10 MEQ (1080 MG) SR tablet Take 10 mEq by mouth 2 (two) times daily.     . SUNItinib (SUTENT) 37.5 MG capsule Take 1 tablet (37.5 mg) by mouth daily for 2 wks followed by 1 wk rest 28 capsule 1  . traMADol (ULTRAM) 50 MG tablet Take by mouth as needed.    . valsartan-hydrochlorothiazide (DIOVAN-HCT) 160-12.5 MG per tablet     . VOLTAREN 1 % GEL apply 3 grams to LARGE JOINTS three  times a day if needed  0   No current facility-administered medications for this visit.     PHYSICAL EXAMINATION: ECOG PERFORMANCE STATUS: 1 - Symptomatic but completely ambulatory  . Vitals:   08/14/16 1329  BP: (!) 143/76  Pulse: (!) 47  Resp: 18  Temp: 98.1 F (36.7 C)    Filed Weights   08/14/16 1329  Weight: 191 lb (86.6 kg)   .Body mass index is 27.8 kg/m.  GENERAL:alert, in no acute distress and comfortable SKIN: skin color, texture, turgor are normal, no rashes or significant lesions EYES: normal, conjunctiva are pink and non-injected, sclera clear OROPHARYNX:no exudate, no erythema and lips, buccal mucosa, and tongue normal  NECK: supple, no JVD, thyroid normal size, non-tender, without nodularity LYMPH:  no palpable lymphadenopathy in the cervical, axillary or inguinal LUNGS: clear to auscultation with normal respiratory effort HEART: regular rate & rhythm,  no murmurs and no lower extremity edema ABDOMEN: abdomen soft, non-tender, normoactive bowel sounds  Musculoskeletal: no cyanosis of digits and no clubbing  PSYCH: alert & oriented x 3 with fluent speech NEURO: no focal motor/sensory deficits  LABORATORY DATA:   I have reviewed the data as listed  . CBC Latest Ref Rng & Units 08/14/2016 06/19/2016 05/08/2016  WBC 4.0 - 10.3 10e3/uL 4.0 4.6 4.3  Hemoglobin 13.0 - 17.1  g/dL 12.8(L) 13.5 12.5(L)  Hematocrit 38.4 - 49.9 % 36.6(L) 38.3(L) 36.7(L)  Platelets 140 - 400 10e3/uL 204 206 205   . CBC    Component Value Date/Time   WBC 4.0 08/14/2016 1313   WBC 7.3 06/13/2015 0915   RBC 3.37 (L) 08/14/2016 1313   RBC 4.36 06/13/2015 0915   HGB 12.8 (L) 08/14/2016 1313   HCT 36.6 (L) 08/14/2016 1313   PLT 204 08/14/2016 1313   MCV 108.6 (H) 08/14/2016 1313   MCH 38.0 (H) 08/14/2016 1313   MCH 33.9 06/13/2015 0915   MCHC 35.0 08/14/2016 1313   MCHC 34.3 06/13/2015 0915   RDW 13.9 08/14/2016 1313   LYMPHSABS 2.6 08/14/2016 1313   MONOABS 0.4 08/14/2016 1313   EOSABS 0.1 08/14/2016 1313   BASOSABS 0.0 08/14/2016 1313   . CMP Latest Ref Rng & Units 08/14/2016 06/19/2016 05/08/2016  Glucose 70 - 140 mg/dl 134 130 228(H)  BUN 7.0 - 26.0 mg/dL 13.0 17.8 13.5  Creatinine 0.7 - 1.3 mg/dL 1.9(H) 1.5(H) 1.4(H)  Sodium 136 - 145 mEq/L 141 143 140  Potassium 3.5 - 5.1 mEq/L 4.0 3.6 4.5  Chloride 101 - 111 mmol/L - - -  CO2 22 - 29 mEq/L 26 27 28   Calcium 8.4 - 10.4 mg/dL 9.0 9.3 8.9  Total Protein 6.4 - 8.3 g/dL 5.8(L) 6.0(L) 5.7(L)  Total Bilirubin 0.20 - 1.20 mg/dL 0.82 1.12 1.17  Alkaline Phos 40 - 150 U/L 86 101 90  AST 5 - 34 U/L 33 32 30  ALT 0 - 55 U/L 31 38 25      RADIOGRAPHIC STUDIES: I have personally reviewed the radiological images as listed and agreed with the findings in the report. Nm Pet Image Restag (ps) Skull Base To Thigh  Result Date: 08/08/2016 CLINICAL DATA:  Subsequent treatment strategy for metastatic renal cell carcinoma. Right nephrectomy 20 years prior for renal cell carcinoma. History of prior Whipple procedure with pancreas, spleen and small intestine resection for local tumor recurrence. Subsequent thyroidectomy and partial gastrectomy for recurrence. Subsequent lung metastases treated with SBRT to dominant right lung nodule and with Sutent therapy. EXAM: NUCLEAR MEDICINE PET  SKULL BASE TO THIGH TECHNIQUE: 9.6 mCi F-18 FDG was  injected intravenously. Full-ring PET imaging was performed from the skull base to thigh after the radiotracer. CT data was obtained and used for attenuation correction and anatomic localization. FASTING BLOOD GLUCOSE:  Value: 107 mg/dl COMPARISON:  03/20/2016 PET-CT. FINDINGS: NECK No hypermetabolic lymph nodes in the neck.  Total thyroidectomy. CHEST Left anterior descending coronary atherosclerosis. Top-normal heart size. No hypermetabolic axillary, mediastinal or hilar lymph nodes. No pleural effusions. No pneumothorax. Sharply marginated triangular consolidation in the right mid lung with associated mild volume loss and distortion, decreased in size since 03/20/2016, with decreased mild hypermetabolism (max SUV 4.5, previous max SUV 8.9), compatible with continued evolution of radiation fibrosis. Peripheral right upper lobe 2 mm solid pulmonary nodule is below PET resolution and stable. Two 5 mm solid left lower lobe pulmonary nodules (series 7/ images 58 and 61) have mildly increased in size from 3 mm on 03/20/2016, and are below PET resolution and not associated with significant metabolism. No acute consolidative airspace disease or additional significant pulmonary nodules. There is new vague mild soft tissue hypermetabolism (max SUV 4.3) adjacent to the lateral margin of the lateral right fourth rib without associated discrete mass on the CT images. ABDOMEN/PELVIS No abnormal hypermetabolic activity within the liver or adrenal glands. No hypermetabolic lymph nodes in the abdomen or pelvis. Stable postsurgical changes from pancreatectomy, partial gastrectomy, splenectomy and cholecystectomy. Expected pneumobilia in the liver. No hypermetabolic mass in the right nephrectomy bed. Nonobstructing 6 mm lower left renal stone. No left hydronephrosis. Atherosclerotic nonaneurysmal abdominal aorta. Hypermetabolic 2.8 x 1.2 cm nodule in the superficial subcutaneous medial ventral left abdominal wall with max SUV 7.3,  previously 2.8 x 1.2 cm with max SUV 6.7, not appreciably changed in size or metabolism. 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 Decreased focal sclerosis in the lateral right fifth rib with no residual hypermetabolism, consistent with healing osteonecrosis. No additional skeletal foci of hypermetabolism. IMPRESSION: 1. Two 5 mm solid left lower lobe pulmonary nodules have mildly increased in size since 03/20/2016, below PET resolution, suspicious for slowly enlarging pulmonary metastases. 2. 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 3. New focal hypermetabolism with associated healing fracture in the anterior right third rib. Decreased sclerosis with no residual hypermetabolism in the lateral right fifth rib. New vague mild soft tissue hypermetabolism adjacent to the lateral right fourth rib without associated discrete mass on the CT images. These findings are all favored to represent radiation related changes. 4. Stable nonspecific hypermetabolic superficial subcutaneous nodule in the medial ventral left abdominal wall, possibly related to subcutaneous injections. 5. Aortic atherosclerosis.  One vessel coronary atherosclerosis. 6. Nonobstructing left nephrolithiasis. Electronically Signed   By: Ilona Sorrel M.D.   On: 08/08/2016 09:14     ASSESSMENT & PLAN:  66 year old Caucasian male with  #1 Remote history of metastatic renal cell carcinoma status post right-sided nephrectomy with multiple recurrences treated with surgical excision as detailed above. He has been NED status for the last 8 years. He stopped following up with the cancer Center due to concerns about medical costs. He has been referred back to [place] by Dr. Sharlett Iles after recent CT of the chest showed multiple lung nodules concerning for neoplastic etiology and was  noted to have recurrent metastatic RCC.  #2 Recurrent metastatic Renal cell carcinoma with multiple lung  metastases. Patient is status post Sutent 10 cycles. Has had SBRT to the dominant right lung nodule with partial response. PET/CT scan from 12/27/2015 shows no evidence of disease progression. PET/CT 03/20/2016 - shows some radiation pneumonitis no new pulmonary nodules. Subtle uptake in the ribs which could be from trauma/bone change due to chronic kidney disease. Though bone metastases cannot be ruled out findings are not definitive enough to warrant change in treatment at this time. No other evidence of metastatic RCC progression at this time.  Rpt PET/CT 08/08/2016 with a couple of mildly enlarge LLL pulmonary nodules - no other overt evidence of RCC progression at this time.  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 Confirmed by [place].  #4 mild thrombocytopenia likely related to Sutent- resolved with dose reduction. #5 elevation in bilirubin levels likely due to Sutent. Bilirubin stable .  #6 grade 1-2 fatigue due to Sutent -better with dose reduction and changing schedule to 2 week on and 1 week. #7 Dysguesia from Sutent - manageable per patient. He is trying to eat as well as he can and is maintaining his body weight. #8 RBC macrocytosis due to Sutent 9 chronic kidney disease/single kidney -creatinine increased to 1.9 (patient has been on ibuprofen per PCP/Dr Deveshwar --recommended he make his PCP aware of his renal function and consider avoiding NSAIDS due to limited renal reserve). #10 rheumatoid arthritis -follows with Dr. Estanislado Pandy for mx Plan -Patient has no clinical evidence of disease progression at this time.  -PET/CT results reviewed in details with the patient and all questions answered. - No prohibitive toxicities from Sutent at this time that would indicate medication dose changes. -Continue current dose of Sutent with close  monitoring of labs. ANC 0.9 (at nadir -- just starting weeks off) --Continue follow-up with primary care physician and rheumatology.  RTC with Dr Irene Limbo with labs in 6 weeks  I spent 20 minutes counseling the patient face to face. The total time spent in the appointment was 25 minutes and more than 50% was on counseling and direct patient cares.    Sullivan Lone MD [place] AAHIVMS [facility] Hematology/Oncology Physician [facility]  (Office):       [phone] (Work cell):  [phone] (Fax):           [phone]

## 2016-09-25 ENCOUNTER — Ambulatory Visit (HOSPITAL_BASED_OUTPATIENT_CLINIC_OR_DEPARTMENT_OTHER): Payer: Medicare Other | Admitting: Hematology

## 2016-09-25 ENCOUNTER — Telehealth: Payer: Self-pay | Admitting: Hematology

## 2016-09-25 ENCOUNTER — Other Ambulatory Visit (HOSPITAL_BASED_OUTPATIENT_CLINIC_OR_DEPARTMENT_OTHER): Payer: Medicare Other

## 2016-09-25 ENCOUNTER — Encounter: Payer: Self-pay | Admitting: Hematology

## 2016-09-25 VITALS — BP 136/67 | HR 54 | Temp 98.3°F | Resp 18 | Ht 69.5 in | Wt 198.1 lb

## 2016-09-25 DIAGNOSIS — C649 Malignant neoplasm of unspecified kidney, except renal pelvis: Secondary | ICD-10-CM

## 2016-09-25 DIAGNOSIS — N289 Disorder of kidney and ureter, unspecified: Secondary | ICD-10-CM

## 2016-09-25 DIAGNOSIS — M069 Rheumatoid arthritis, unspecified: Secondary | ICD-10-CM | POA: Diagnosis not present

## 2016-09-25 DIAGNOSIS — N189 Chronic kidney disease, unspecified: Secondary | ICD-10-CM | POA: Diagnosis not present

## 2016-09-25 DIAGNOSIS — C7800 Secondary malignant neoplasm of unspecified lung: Secondary | ICD-10-CM

## 2016-09-25 DIAGNOSIS — C7801 Secondary malignant neoplasm of right lung: Secondary | ICD-10-CM

## 2016-09-25 DIAGNOSIS — C7900 Secondary malignant neoplasm of unspecified kidney and renal pelvis: Secondary | ICD-10-CM

## 2016-09-25 LAB — COMPREHENSIVE METABOLIC PANEL
ALK PHOS: 90 U/L (ref 40–150)
ALT: 30 U/L (ref 0–55)
ANION GAP: 8 meq/L (ref 3–11)
AST: 25 U/L (ref 5–34)
Albumin: 3.6 g/dL (ref 3.5–5.0)
BILIRUBIN TOTAL: 1.1 mg/dL (ref 0.20–1.20)
BUN: 16.5 mg/dL (ref 7.0–26.0)
CO2: 25 mEq/L (ref 22–29)
Calcium: 9.3 mg/dL (ref 8.4–10.4)
Chloride: 110 mEq/L — ABNORMAL HIGH (ref 98–109)
Creatinine: 1.7 mg/dL — ABNORMAL HIGH (ref 0.7–1.3)
EGFR: 42 mL/min/{1.73_m2} — AB (ref >90)
Glucose: 190 mg/dl — ABNORMAL HIGH (ref 70–140)
Potassium: 4.3 mEq/L (ref 3.5–5.1)
Sodium: 143 mEq/L (ref 136–145)
TOTAL PROTEIN: 5.7 g/dL — AB (ref 6.4–8.3)

## 2016-09-25 LAB — LACTATE DEHYDROGENASE: LDH: 177 U/L (ref 125–245)

## 2016-09-25 LAB — CBC & DIFF AND RETIC
BASO%: 0.5 % (ref 0.0–2.0)
Basophils Absolute: 0 10*3/uL (ref 0.0–0.1)
EOS%: 1.6 % (ref 0.0–7.0)
Eosinophils Absolute: 0.1 10*3/uL (ref 0.0–0.5)
HCT: 37.6 % — ABNORMAL LOW (ref 38.4–49.9)
HGB: 12.8 g/dL — ABNORMAL LOW (ref 13.0–17.1)
IMMATURE RETIC FRACT: 6.5 % (ref 3.00–10.60)
LYMPH%: 39.3 % (ref 14.0–49.0)
MCH: 37.3 pg — AB (ref 27.2–33.4)
MCHC: 34 g/dL (ref 32.0–36.0)
MCV: 109.6 fL — AB (ref 79.3–98.0)
MONO#: 0.5 10*3/uL (ref 0.1–0.9)
MONO%: 12.3 % (ref 0.0–14.0)
NEUT#: 2 10*3/uL (ref 1.5–6.5)
NEUT%: 46.3 % (ref 39.0–75.0)
PLATELETS: 157 10*3/uL (ref 140–400)
RBC: 3.43 10*6/uL — AB (ref 4.20–5.82)
RDW: 13.3 % (ref 11.0–14.6)
Retic %: 2.52 % — ABNORMAL HIGH (ref 0.80–1.80)
Retic Ct Abs: 86.44 10*3/uL (ref 34.80–93.90)
WBC: 4.4 10*3/uL (ref 4.0–10.3)
lymph#: 1.7 10*3/uL (ref 0.9–3.3)

## 2016-09-25 NOTE — Telephone Encounter (Signed)
Lab and follow up appointments scheduled per 09/25/16 los. Patient was given a copy of the AVS report and appointment schedule, per 09/25/16 los.

## 2016-09-26 NOTE — Progress Notes (Signed)
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 2) Remote history of metastatic renal cell carcinoma Diagnosed with renal cell carcinoma 20 years ago and had a right nephrectomy. About 8 years after that he was noted to have abdominal recurrence in his pancreas spleen and small intestine and had a Whipple's procedure and significant abdominal surgery and notes that 10 out of 17 lymph nodes were positive. He also had his gallbladder removed. Postoperative course was complicated by an internal hemorrhage as per his report. Patient notes 2-3 years after that he had recurrence in his thyroid that led to a thyroidectomy. [June 2004] later he had partial gastrectomy for local recurrence. [February 2005] when he presented with GI bleeding. Patient notes that he has had no known evidence of recurrence over the last 8 years until his recent CT scan showed lung nodules.  Current Treatment: Sutent 37.5 mg po daily for 2 weeks on and 1 weeks off. S/p 11 cycles  SBRT to dominant RML nodule.  Previous treatment Sutent on cycle 1 - 50 mg by mouth daily for 4 weeks on and 2-weeks off. He was dose adjusted to 37.5 mg by mouth daily for 2 weeks with 1 week of to help mitigate issues with fatigue, mild hyperbilirubinemia, cytopenias.  INTERVAL HISTORY:  [name] is here for for his scheduled follow-up after having completed his 11th cycle of Sutent. Notes no acute new symptoms.  He still has some grade 1 fatigue with the Sutent but is unchanged unmanageable .No significant GI symptoms. No fevers or chills. Notes significant increase in appetite. Eating well. He has been staying off the naproxen/ibuprofen. Notes some joint aches and pains  from his rheumatoid arthritis is functioning okay. No other acute new focal symptoms.   REVIEW OF SYSTEMS:    10 Point review of systems of done and is negative except as noted above.   Past Medical History:  Diagnosis Date  . Arthritis   . Cancer [facility])    Renal cell  . DDD (degenerative disc disease), cervical 05/28/2016  . DDD (degenerative disc disease), lumbar 05/28/2016  . Depression   . Diabetes ([place])   . Hyperlipidemia 05/28/2016  . Hypertension   . Hypothyroidism   . 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 Kidney disease   . Nephrolithiasis   . Osteoarthritis of both hands 05/28/2016  . Osteoarthritis of both knees 05/28/2016  . Peripheral vascular disorder ([place]) 05/28/2016  . 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 Rheumatoid arthritis ([place])   . Thyroid cancer ([place]) 05/28/2016    . Past Surgical History:  Procedure Laterality Date  . CATARACT EXTRACTION Bilateral 2013  . CHOLECYSTECTOMY    . GASTRECTOMY     tumor removed  . LIPOMA EXCISION Right 1999  . NEPHRECTOMY Right   . PANCREATECTOMY    . SPLENECTOMY    . THYROIDECTOMY    . URETERAL STENT PLACEMENT Left 2012  . WHIPPLE PROCEDURE      . Social History  Substance Use Topics  . Smoking status: Former Smoker    Years: 1.50  . Smokeless tobacco: Never Used  . Alcohol use Yes     Comment: occasional beer    ALLERGIES:  has No Known Allergies.  MEDICATIONS:  Current Outpatient  Prescriptions  Medication Sig Dispense Refill  . amLODipine (NORVASC) 10 MG tablet Take 1 tablet (10 mg total) by mouth daily. 90 tablet 3  . Ascorbic Acid (VITAMIN C) 1000 MG tablet Take 1,000 mg by mouth daily.    Marland Kitchen aspirin 81 MG tablet Take 81 mg by mouth daily.    Marland Kitchen atenolol (TENORMIN) 100 MG tablet     . augmented betamethasone dipropionate (DIPROLENE-AF) 0.05 % cream   0  . calcium citrate-vitamin D (CITRACAL+D) 315-200 MG-UNIT per tablet Take 2 tablets by mouth  daily.     . Cyanocobalamin (VITAMIN B-12) 2500 MCG SUBL Take by mouth daily.     Marland Kitchen docusate sodium (COLACE) 100 MG capsule Take 100 mg by mouth 2 (two) times daily.    . dorzolamide-timolol (COSOPT) 22.3-6.8 MG/ML ophthalmic solution place 1 drop into affected eye twice a day  1  . famotidine (PEPCID) 20 MG tablet Take 20 mg by mouth 2 (two) times daily.    . ferrous sulfate 325 (65 FE) MG tablet Take 325 mg by mouth 2 (two) times daily with a meal.     . Glucosamine-MSM-Hyaluronic Acd (JOINT HEALTH) 750-375-30 MG TABS Take 2 tablets by mouth daily.    Marland Kitchen HYDROcodone-acetaminophen (NORCO/VICODIN) 5-325 MG tablet Take 1-2 tablets by mouth every 4 (four) hours as needed for moderate pain.    . hydroxychloroquine (PLAQUENIL) 200 MG tablet 200mg  po qAM and 100mg  po qPM 135 tablet 1  . insulin lispro (HUMALOG) 100 UNIT/ML injection Inject into the skin 3 (three) times daily before meals.    . Iron-Vitamins (GERITOL COMPLETE) TABS Take 1 tablet by mouth daily.    . Lactobacillus (ACIDOPHILUS) CAPS capsule Take 1 capsule by mouth daily.    Marland Kitchen latanoprost (XALATAN) 0.005 % ophthalmic solution place 1 drop into both eyes every evening  1  . LEVEMIR 100 UNIT/ML injection Inject 7 Units into the skin 2 (two) times daily.   1  . levothyroxine (SYNTHROID, LEVOTHROID) 300 MCG tablet Take 300 mcg by mouth 3 (three) times a week.     . lovastatin (MEVACOR) 40 MG tablet Take 40 mg by mouth at bedtime.     . minoxidil (LONITEN) 10 MG tablet Take 10 mg by mouth 2 (two) times daily.     . Multiple Vitamin (MULTIVITAMIN) tablet Take 1 tablet by mouth daily.    . Omega-3 Fatty Acids (FISH OIL) 1200 MG CAPS Take 1 capsule by mouth daily.    . Pancrelipase, Lip-Prot-Amyl, (CREON) 6000 UNITS CPEP Take 4 capsules by mouth 3 (three) times daily.    . potassium citrate (UROCIT-K) 10 MEQ (1080 MG) SR tablet Take 10 mEq by mouth 2 (two) times daily.     . SUNItinib (SUTENT) 37.5 MG capsule Take 1 tablet (37.5 mg) by mouth  daily for 2 wks followed by 1 wk rest 28 capsule 1  . traMADol (ULTRAM) 50 MG tablet Take by mouth as needed.    . valsartan-hydrochlorothiazide (DIOVAN-HCT) 160-12.5 MG per tablet     . VOLTAREN 1 % GEL apply 3 grams to LARGE JOINTS three times a day if needed  0   No current facility-administered medications for this visit.     PHYSICAL EXAMINATION: ECOG PERFORMANCE STATUS: 1 - Symptomatic but completely ambulatory  . Vitals:   09/25/16 0924  BP: 136/67  Pulse: (!) 54  Resp: 18  Temp: 98.3 F (36.8 C)    Filed Weights   09/25/16 0924  Weight: 198 lb 2 oz (  89.9 kg)   .Body mass index is 28.84 kg/m.  GENERAL:alert, in no acute distress and comfortable SKIN: skin color, texture, turgor are normal, no rashes or significant lesions EYES: normal, conjunctiva are pink and non-injected, sclera clear OROPHARYNX:no exudate, no erythema and lips, buccal mucosa, and tongue normal  NECK: supple, no JVD, thyroid normal size, non-tender, without nodularity LYMPH:  no palpable lymphadenopathy in the cervical, axillary or inguinal LUNGS: clear to auscultation with normal respiratory effort HEART: regular rate & rhythm,  no murmurs and no lower extremity edema ABDOMEN: abdomen soft, non-tender, normoactive bowel sounds  Musculoskeletal: no cyanosis of digits and no clubbing  PSYCH: alert & oriented x 3 with fluent speech NEURO: no focal motor/sensory deficits  LABORATORY DATA:   I have reviewed the data as listed  . CBC Latest Ref Rng & Units 09/25/2016 08/14/2016 06/19/2016  WBC 4.0 - 10.3 10e3/uL 4.4 4.0 4.6  Hemoglobin 13.0 - 17.1 g/dL 12.8(L) 12.8(L) 13.5  Hematocrit 38.4 - 49.9 % 37.6(L) 36.6(L) 38.3(L)  Platelets 140 - 400 10e3/uL 157 204 206   . CBC    Component Value Date/Time   WBC 4.4 09/25/2016 0842   WBC 7.3 06/13/2015 0915   RBC 3.43 (L) 09/25/2016 0842   RBC 4.36 06/13/2015 0915   HGB 12.8 (L) 09/25/2016 0842   HCT 37.6 (L) 09/25/2016 0842   PLT 157  09/25/2016 0842   MCV 109.6 (H) 09/25/2016 0842   MCH 37.3 (H) 09/25/2016 0842   MCH 33.9 06/13/2015 0915   MCHC 34.0 09/25/2016 0842   MCHC 34.3 06/13/2015 0915   RDW 13.3 09/25/2016 0842   LYMPHSABS 1.7 09/25/2016 0842   MONOABS 0.5 09/25/2016 0842   EOSABS 0.1 09/25/2016 0842   BASOSABS 0.0 09/25/2016 0842   . CMP Latest Ref Rng & Units 09/25/2016 08/14/2016 06/19/2016  Glucose 70 - 140 mg/dl 190(H) 134 130  BUN 7.0 - 26.0 mg/dL 16.5 13.0 17.8  Creatinine 0.7 - 1.3 mg/dL 1.7(H) 1.9(H) 1.5(H)  Sodium 136 - 145 mEq/L 143 141 143  Potassium 3.5 - 5.1 mEq/L 4.3 4.0 3.6  Chloride 101 - 111 mmol/L - - -  CO2 22 - 29 mEq/L 25 26 27   Calcium 8.4 - 10.4 mg/dL 9.3 9.0 9.3  Total Protein 6.4 - 8.3 g/dL 5.7(L) 5.8(L) 6.0(L)  Total Bilirubin 0.20 - 1.20 mg/dL 1.10 0.82 1.12  Alkaline Phos 40 - 150 U/L 90 86 101  AST 5 - 34 U/L 25 33 32  ALT 0 - 55 U/L 30 31 38    RADIOGRAPHIC STUDIES: I have personally reviewed the radiological images as listed and agreed with the findings in the report. No results found.   ASSESSMENT & PLAN:  66 year old Caucasian male with  #1 Recurrent metastatic Renal cell carcinoma with multiple lung metastases. Patient is status post Sutent 11 cycles. Has had SBRT to the dominant right lung nodule with partial response. PET/CT scan from 12/27/2015 shows no evidence of disease progression. PET/CT 03/20/2016 - shows some radiation pneumonitis no new pulmonary nodules. Subtle uptake in the ribs which could be from trauma/bone change due to chronic kidney disease. Though bone metastases cannot be ruled out findings are not definitive enough to warrant change in treatment at this time. No other evidence of metastatic RCC progression at this time.  Rpt PET/CT 08/08/2016 with a couple of mildly enlarge LLL pulmonary nodules - no other overt evidence of RCC progression at this time.  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 Confirmed by [place].  #4 mild thrombocytopenia likely related to Sutent- resolved with dose reduction. #5 elevation in bilirubin levels likely due to Sutent. Bilirubin normalized #6 grade 1 fatigue due to Sutent -better with dose reduction and changing schedule to 2 week on and 1 week. #7 Dysguesia from Sutent - manageable per patient. He is trying to eat as well as he can and is maintaining his body weight. #8 RBC macrocytosis due to Sutent 9 chronic kidney disease/single kidney -creatinine improved from 1.9 to 1.7. Recommended to avoid NSAIDS and other nephrotoxic medications. #10 rheumatoid arthritis -follows with Dr. Estanislado Pandy for mx Plan -Patient has no lab or clinical evidence of disease progression at this time.  - No prohibitive toxicities from Sutent at this time that would indicate medication dose changes. -Continue current dose of Sutent with close monitoring of labs. -will rpt PET/CT in 6 weeks prior to next clinic visit (cannot have CT w contrast due to single kidney with CKD) --Continue follow-up with primary care physician and rheumatology.  RTC with Dr Irene Limbo with labs and PET/CT in 6 weeks  I spent 20 minutes counseling the patient face to face. The total time spent in the appointment was 25 minutes and more than 50% was on counseling and direct patient cares.    Sullivan Lone MD [place] AAHIVMS [facility] Hematology/Oncology Physician [facility]  (Office):       [phone] (Work cell):  [phone] (Fax):           [phone]

## 2016-10-08 ENCOUNTER — Telehealth: Payer: Self-pay | Admitting: *Deleted

## 2016-10-08 ENCOUNTER — Other Ambulatory Visit: Payer: Self-pay | Admitting: *Deleted

## 2016-10-08 DIAGNOSIS — C649 Malignant neoplasm of unspecified kidney, except renal pelvis: Secondary | ICD-10-CM

## 2016-10-08 MED ORDER — SUNITINIB MALATE 37.5 MG PO CAPS: ORAL_CAPSULE | ORAL | 1 refills | Status: DC

## 2016-10-08 NOTE — Telephone Encounter (Signed)
"  This is Engineer, production calling to verify Dr. Irene Limbo wants this patient to continue Sutent 37.5 mg.  Take ine daily for two weeks on and one week rest."   Verified as 09-25-2016 visit plan to continue. "This is the last refill with this order.  Please let the provider know to send a NEW ORDER IN April."

## 2016-10-08 NOTE — Telephone Encounter (Signed)
er

## 2016-10-09 NOTE — Telephone Encounter (Signed)
Prescription signed and faxed to [facility].

## 2016-10-15 NOTE — Progress Notes (Signed)
Received a fax from patients insurance regarding a prior authorization DENIAL for brand name Voltaren gel. Ran a test claim for he generic and it showed a paid claim. Spoke with Baxter Flattery from [facility] [phone] who states that patient received the brand name last year under insurance. She got a paid claim of $39.57 for the generic form today.   Left message for patient to call back to inform him of the results.    Will send document to scan center.  Tramaine Snell, CPhT 1:25 PM

## 2016-10-15 NOTE — Progress Notes (Unsigned)
Received a prior authorization request from [facility] for Voltaren gel. The request was submitted through cover my meds. Will upload any new information that is received.   Verdun Rackley, [place], CPhT

## 2016-10-16 ENCOUNTER — Telehealth: Payer: Self-pay | Admitting: Rheumatology

## 2016-10-16 NOTE — Telephone Encounter (Signed)
Patient called stating that he was confused about his prescription for Voltaren gel.  His pharmacy called in regards to [place].  He stated that he does not use [facility]. He uses [facility].  Could someone please give him a call at [phone].  Thank you.

## 2016-10-16 NOTE — Telephone Encounter (Signed)
Returned call to [employer] Patient Assistance Program.  Call was to confirm the refill they received on October 09, 2016 did come from the doctor's office in order to process the Sutent order.  Confirmed.  No further questions.

## 2016-10-16 NOTE — Telephone Encounter (Signed)
Patient advised he may pick his prescription up at [facility]. Patient advise the message was about a prior authorization. Patient states he doe not need to have brand name Voltaren Gel he is okay with the generic.

## 2016-11-06 ENCOUNTER — Encounter (HOSPITAL_COMMUNITY)
Admission: RE | Admit: 2016-11-06 | Discharge: 2016-11-06 | Disposition: A | Discharge status: Home / Self Care | Payer: Medicare Other | Source: Ambulatory Visit | Attending: Hematology | Admitting: Hematology

## 2016-11-06 DIAGNOSIS — C649 Malignant neoplasm of unspecified kidney, except renal pelvis: Secondary | ICD-10-CM | POA: Diagnosis not present

## 2016-11-06 DIAGNOSIS — C7801 Secondary malignant neoplasm of right lung: Secondary | ICD-10-CM | POA: Insufficient documentation

## 2016-11-06 LAB — GLUCOSE, CAPILLARY: GLUCOSE-CAPILLARY: 71 mg/dL (ref 65–99)

## 2016-11-06 MED ORDER — FLUDEOXYGLUCOSE F - 18 (FDG) INJECTION
9.5000 | Freq: Once | INTRAVENOUS | Status: AC | PRN
  Administered 2016-11-06: 9.5 via INTRAVENOUS

## 2016-11-13 ENCOUNTER — Other Ambulatory Visit (HOSPITAL_BASED_OUTPATIENT_CLINIC_OR_DEPARTMENT_OTHER): Payer: Medicare Other

## 2016-11-13 ENCOUNTER — Encounter (INDEPENDENT_AMBULATORY_CARE_PROVIDER_SITE_OTHER): Payer: Self-pay

## 2016-11-13 ENCOUNTER — Telehealth: Payer: Self-pay | Admitting: Hematology

## 2016-11-13 ENCOUNTER — Encounter: Payer: Self-pay | Admitting: Hematology

## 2016-11-13 ENCOUNTER — Ambulatory Visit (HOSPITAL_BASED_OUTPATIENT_CLINIC_OR_DEPARTMENT_OTHER): Payer: Medicare Other | Admitting: Hematology

## 2016-11-13 VITALS — BP 148/67 | HR 44 | Temp 97.5°F | Resp 18 | Ht 69.5 in | Wt 186.8 lb

## 2016-11-13 DIAGNOSIS — C7800 Secondary malignant neoplasm of unspecified lung: Secondary | ICD-10-CM | POA: Diagnosis not present

## 2016-11-13 DIAGNOSIS — C649 Malignant neoplasm of unspecified kidney, except renal pelvis: Secondary | ICD-10-CM

## 2016-11-13 DIAGNOSIS — C7801 Secondary malignant neoplasm of right lung: Secondary | ICD-10-CM

## 2016-11-13 LAB — CBC & DIFF AND RETIC
BASO%: 0.9 % (ref 0.0–2.0)
Basophils Absolute: 0 10*3/uL (ref 0.0–0.1)
EOS ABS: 0.2 10*3/uL (ref 0.0–0.5)
EOS%: 5.6 % (ref 0.0–7.0)
HCT: 40.3 % (ref 38.4–49.9)
HEMOGLOBIN: 14.3 g/dL (ref 13.0–17.1)
IMMATURE RETIC FRACT: 8.7 % (ref 3.00–10.60)
LYMPH%: 50.5 % — AB (ref 14.0–49.0)
MCH: 37.5 pg — ABNORMAL HIGH (ref 27.2–33.4)
MCHC: 35.5 g/dL (ref 32.0–36.0)
MCV: 105.8 fL — AB (ref 79.3–98.0)
MONO#: 0.4 10*3/uL (ref 0.1–0.9)
MONO%: 11.2 % (ref 0.0–14.0)
NEUT#: 1 10*3/uL — ABNORMAL LOW (ref 1.5–6.5)
NEUT%: 31.8 % — AB (ref 39.0–75.0)
PLATELETS: 172 10*3/uL (ref 140–400)
RBC: 3.81 10*6/uL — ABNORMAL LOW (ref 4.20–5.82)
RDW: 13.3 % (ref 11.0–14.6)
Retic %: 3.26 % — ABNORMAL HIGH (ref 0.80–1.80)
Retic Ct Abs: 124.21 10*3/uL — ABNORMAL HIGH (ref 34.80–93.90)
WBC: 3.2 10*3/uL — ABNORMAL LOW (ref 4.0–10.3)
lymph#: 1.6 10*3/uL (ref 0.9–3.3)

## 2016-11-13 LAB — LACTATE DEHYDROGENASE: LDH: 251 U/L — ABNORMAL HIGH (ref 125–245)

## 2016-11-13 LAB — COMPREHENSIVE METABOLIC PANEL
ALT: 49 U/L (ref 0–55)
AST: 42 U/L — AB (ref 5–34)
Albumin: 3.7 g/dL (ref 3.5–5.0)
Alkaline Phosphatase: 86 U/L (ref 40–150)
Anion Gap: 9 mEq/L (ref 3–11)
BUN: 8.7 mg/dL (ref 7.0–26.0)
CHLORIDE: 105 meq/L (ref 98–109)
CO2: 27 mEq/L (ref 22–29)
CREATININE: 1.5 mg/dL — AB (ref 0.7–1.3)
Calcium: 9 mg/dL (ref 8.4–10.4)
EGFR: 49 mL/min/{1.73_m2} — ABNORMAL LOW (ref >90)
GLUCOSE: 202 mg/dL — AB (ref 70–140)
POTASSIUM: 3.7 meq/L (ref 3.5–5.1)
SODIUM: 141 meq/L (ref 136–145)
Total Bilirubin: 1.01 mg/dL (ref 0.20–1.20)
Total Protein: 6.2 g/dL — ABNORMAL LOW (ref 6.4–8.3)

## 2016-11-13 NOTE — Telephone Encounter (Signed)
Appointments scheduled per 4.12.18 LOS. Patient given AVS report and calendars with future scheduled appointments. °

## 2016-11-14 NOTE — Progress Notes (Signed)
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 2) Remote history of metastatic renal cell carcinoma Diagnosed with renal cell carcinoma 20 years ago and had a right nephrectomy. About 8 years after that he was noted to have abdominal recurrence in his pancreas spleen and small intestine and had a Whipple's procedure and significant abdominal surgery and notes that 10 out of 17 lymph nodes were positive. He also had his gallbladder removed. Postoperative course was complicated by an internal hemorrhage as per his report. Patient notes 2-3 years after that he had recurrence in his thyroid that led to a thyroidectomy. [June 2004] later he had partial gastrectomy for local recurrence. [February 2005] when he presented with GI bleeding. Patient notes that he has had no known evidence of recurrence over the last 8 years until his recent CT scan showed lung nodules.  Current Treatment: Sutent 37.5 mg po daily for 2 weeks on and 1 weeks off. S/p 12 cycles  SBRT to dominant RML nodule.  Previous treatment Sutent on cycle 1 - 50 mg by mouth daily for 4 weeks on and 2-weeks off. He was dose adjusted to 37.5 mg by mouth daily for 2 weeks with 1 week of to help mitigate issues with fatigue, mild hyperbilirubinemia, cytopenias.  INTERVAL HISTORY:  Dylan Jefferson is here for for his scheduled follow-up after having completed his 12th cycle of Sutent. Notes no acute new symptoms.  He still has some grade 1 fatigue with the Sutent but is unchanged unmanageable .No significant GI symptoms. No fevers or chills. Notes that he is Eating well. Reports that he has been quite active  working in his yard and notes bilateral shoulder and upper back muscular discomfort. PET CT scan results were discussed in details and show no overt evidence of disease progression.   REVIEW OF SYSTEMS:    10 Point review of systems of done and is negative except as noted above.   Past Medical History:  Diagnosis Date  . Arthritis   . Cancer [facility])    Renal cell  . DDD (degenerative disc disease), cervical 05/28/2016  . DDD (degenerative disc disease), lumbar 05/28/2016  . Depression   . Diabetes ([place])   . Hyperlipidemia 05/28/2016  . Hypertension   . Hypothyroidism   . 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 Kidney disease   . Nephrolithiasis   . Osteoarthritis of both hands 05/28/2016  . Osteoarthritis of both knees 05/28/2016  . Peripheral vascular disorder ([place]) 05/28/2016  . 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 Rheumatoid arthritis ([place])   . Thyroid cancer ([place]) 05/28/2016    . Past Surgical History:  Procedure Laterality Date  . CATARACT EXTRACTION Bilateral 2013  . CHOLECYSTECTOMY    . GASTRECTOMY     tumor removed  . LIPOMA EXCISION Right 1999  . NEPHRECTOMY Right   . PANCREATECTOMY    . SPLENECTOMY    . THYROIDECTOMY    . URETERAL STENT PLACEMENT Left 2012  . WHIPPLE PROCEDURE      . Social History  Substance Use Topics  . Smoking status: Former Smoker    Years: 1.50  . Smokeless tobacco: Never Used  . Alcohol use Yes  Comment: occasional beer    ALLERGIES:  has No Known Allergies.  MEDICATIONS:  Current Outpatient Prescriptions  Medication Sig Dispense Refill  . amLODipine (NORVASC) 10 MG tablet Take 1 tablet (10 mg total) by mouth daily. 90 tablet 3  . Ascorbic Acid (VITAMIN C) 1000 MG tablet Take 1,000 mg by mouth daily.    Marland Kitchen aspirin 81 MG tablet Take 81 mg by mouth daily.    Marland Kitchen atenolol (TENORMIN) 100 MG tablet     . augmented betamethasone dipropionate (DIPROLENE-AF) 0.05 % cream   0    . calcium citrate-vitamin D (CITRACAL+D) 315-200 MG-UNIT per tablet Take 2 tablets by mouth daily.     . Cyanocobalamin (VITAMIN B-12) 2500 MCG SUBL Take by mouth daily.     Marland Kitchen docusate sodium (COLACE) 100 MG capsule Take 100 mg by mouth 2 (two) times daily.    . dorzolamide-timolol (COSOPT) 22.3-6.8 MG/ML ophthalmic solution place 1 drop into affected eye twice a day  1  . famotidine (PEPCID) 20 MG tablet Take 20 mg by mouth 2 (two) times daily.    . ferrous sulfate 325 (65 FE) MG tablet Take 325 mg by mouth 2 (two) times daily with a meal.     . Glucosamine-MSM-Hyaluronic Acd (JOINT HEALTH) 750-375-30 MG TABS Take 2 tablets by mouth daily.    Marland Kitchen HYDROcodone-acetaminophen (NORCO/VICODIN) 5-325 MG tablet Take 1-2 tablets by mouth every 4 (four) hours as needed for moderate pain.    . hydroxychloroquine (PLAQUENIL) 200 MG tablet 200mg  po qAM and 100mg  po qPM 135 tablet 1  . insulin lispro (HUMALOG) 100 UNIT/ML injection Inject into the skin 3 (three) times daily before meals.    . Iron-Vitamins (GERITOL COMPLETE) TABS Take 1 tablet by mouth daily.    . Lactobacillus (ACIDOPHILUS) CAPS capsule Take 1 capsule by mouth daily.    Marland Kitchen latanoprost (XALATAN) 0.005 % ophthalmic solution place 1 drop into both eyes every evening  1  . LEVEMIR 100 UNIT/ML injection Inject 7 Units into the skin 2 (two) times daily.   1  . levothyroxine (SYNTHROID, LEVOTHROID) 300 MCG tablet Take 300 mcg by mouth 3 (three) times a week.     . lovastatin (MEVACOR) 40 MG tablet Take 40 mg by mouth at bedtime.     . minoxidil (LONITEN) 10 MG tablet Take 10 mg by mouth 2 (two) times daily.     . Multiple Vitamin (MULTIVITAMIN) tablet Take 1 tablet by mouth daily.    . Omega-3 Fatty Acids (FISH OIL) 1200 MG CAPS Take 1 capsule by mouth daily.    . Pancrelipase, Lip-Prot-Amyl, (CREON) 6000 UNITS CPEP Take 4 capsules by mouth 3 (three) times daily.    . potassium citrate (UROCIT-K) 10 MEQ (1080 MG) SR tablet Take 10 mEq by mouth 2  (two) times daily.     . SUNItinib (SUTENT) 37.5 MG capsule Take 1 tablet (37.5 mg) by mouth daily for 2 wks followed by 1 wk rest 28 capsule 1  . traMADol (ULTRAM) 50 MG tablet Take by mouth as needed.    . valsartan-hydrochlorothiazide (DIOVAN-HCT) 160-12.5 MG per tablet     . VOLTAREN 1 % GEL apply 3 grams to LARGE JOINTS three times a day if needed  0   No current facility-administered medications for this visit.     PHYSICAL EXAMINATION: ECOG PERFORMANCE STATUS: 1 - Symptomatic but completely ambulatory  . Vitals:   11/13/16 0954  BP: (!) 148/67  Pulse: (!) 44  Resp: 18  Temp:  97.5 F (36.4 C)    Filed Weights   11/13/16 0954  Weight: 186 lb 12.8 oz (84.7 kg)   .Body mass index is 27.19 kg/m.  GENERAL:alert, in no acute distress and comfortable SKIN: skin color, texture, turgor are normal, no rashes or significant lesions EYES: normal, conjunctiva are pink and non-injected, sclera clear OROPHARYNX:no exudate, no erythema and lips, buccal mucosa, and tongue normal  NECK: supple, no JVD, thyroid normal size, non-tender, without nodularity LYMPH:  no palpable lymphadenopathy in the cervical, axillary or inguinal LUNGS: clear to auscultation with normal respiratory effort HEART: regular rate & rhythm,  no murmurs and no lower extremity edema ABDOMEN: abdomen soft, non-tender, normoactive bowel sounds  Musculoskeletal: no cyanosis of digits and no clubbing  PSYCH: alert & oriented x 3 with fluent speech NEURO: no focal motor/sensory deficits  LABORATORY DATA:   I have reviewed the data as listed  . CBC Latest Ref Rng & Units 11/13/2016 09/25/2016 08/14/2016  WBC 4.0 - 10.3 10e3/uL 3.2(L) 4.4 4.0  Hemoglobin 13.0 - 17.1 g/dL 14.3 12.8(L) 12.8(L)  Hematocrit 38.4 - 49.9 % 40.3 37.6(L) 36.6(L)  Platelets 140 - 400 10e3/uL 172 157 204   . CBC    Component Value Date/Time   WBC 3.2 (L) 11/13/2016 0830   WBC 7.3 06/13/2015 0915   RBC 3.81 (L) 11/13/2016 0830   RBC  4.36 06/13/2015 0915   HGB 14.3 11/13/2016 0830   HCT 40.3 11/13/2016 0830   PLT 172 11/13/2016 0830   MCV 105.8 (H) 11/13/2016 0830   MCH 37.5 (H) 11/13/2016 0830   MCH 33.9 06/13/2015 0915   MCHC 35.5 11/13/2016 0830   MCHC 34.3 06/13/2015 0915   RDW 13.3 11/13/2016 0830   LYMPHSABS 1.6 11/13/2016 0830   MONOABS 0.4 11/13/2016 0830   EOSABS 0.2 11/13/2016 0830   BASOSABS 0.0 11/13/2016 0830   . CMP Latest Ref Rng & Units 11/13/2016 09/25/2016 08/14/2016  Glucose 70 - 140 mg/dl 202(H) 190(H) 134  BUN 7.0 - 26.0 mg/dL 8.7 16.5 13.0  Creatinine 0.7 - 1.3 mg/dL 1.5(H) 1.7(H) 1.9(H)  Sodium 136 - 145 mEq/L 141 143 141  Potassium 3.5 - 5.1 mEq/L 3.7 4.3 4.0  Chloride 101 - 111 mmol/L - - -  CO2 22 - 29 mEq/L 27 25 26   Calcium 8.4 - 10.4 mg/dL 9.0 9.3 9.0  Total Protein 6.4 - 8.3 g/dL 6.2(L) 5.7(L) 5.8(L)  Total Bilirubin 0.20 - 1.20 mg/dL 1.01 1.10 0.82  Alkaline Phos 40 - 150 U/L 86 90 86  AST 5 - 34 U/L 42(H) 25 33  ALT 0 - 55 U/L 49 30 31    RADIOGRAPHIC STUDIES: I have personally reviewed the radiological images as listed and agreed with the findings in the report. Nm Pet Image Restag (ps) Skull Base To Thigh  Result Date: 11/06/2016 CLINICAL DATA:  Subsequent treatment strategy for metastatic renal cell carcinoma. EXAM: NUCLEAR MEDICINE PET SKULL BASE TO THIGH TECHNIQUE: 9.5 mCi F-18 FDG was injected intravenously. Full-ring PET imaging was performed from the skull base to thigh after the radiotracer. CT data was obtained and used for attenuation correction and anatomic localization. FASTING BLOOD GLUCOSE:  Value: 71 mg/dl COMPARISON:  Multiple exams, including 08/08/2016 FINDINGS: NECK No hypermetabolic lymph nodes in the neck. CHEST Radiation fibrosis in the right mid lung, maximum SUV in this vicinity 2.7, formerly 4.5. Posterior basal segment left lower lobe 4 mm and 5 mm nodules, unchanged, not visibly hypermetabolic but below sensitive PET-CT size thresholds. Mild  cardiomegaly.   Coronary atherosclerosis. ABDOMEN/PELVIS Pneumobilia. Scattered physiologic activity in bowel, multifocal and segmental, scan reduce sensitivity for for bowel lesions. Right nephrectomy.  Splenectomy.  Prior Whipple procedure. Prominent stool throughout the colon favors constipation. Small subcutaneous lesions in the periumbilical region are hypermetabolic, as before. Left kidney lower pole 7 mm nonobstructive renal calculus. SKELETON No focal hypermetabolic activity to suggest skeletal metastasis. Healing right anterior third, fourth, and fifth rib fractures with low-level hypermetabolic activity. These appear benign. IMPRESSION: 1. Reduction in activity associated with the radiation fibrosis in the right mid lung. 2. Small nodules in the posterior basal segment left lower lobe are unchanged, in the 4-5 mm range, and not visibly hypermetabolic but below sensitive PET-CT size thresholds. 3. Hypermetabolic immediately subcutaneous lesions in the periumbilical regions, possibly injection related. Similar to prior. 4. Other imaging findings of potential clinical significance: Mild cardiomegaly. Coronary atherosclerosis. Prominent stool throughout the colon favors constipation. Healing right third, fourth, and fifth rib fractures. Stable left nephrolithiasis. Electronically Signed   By: Van Clines M.D.   On: 11/06/2016 09:41     ASSESSMENT & PLAN:  66 year old Caucasian male with  #1 Recurrent metastatic Renal cell carcinoma with multiple lung metastases. Patient is status post Sutent 12 cycles. Has had SBRT to the dominant right lung nodule with partial response. PET/CT scan from 12/27/2015 shows no evidence of disease progression. PET/CT 03/20/2016 - shows some radiation pneumonitis no new pulmonary nodules. Subtle uptake in the ribs which could be from trauma/bone change due to chronic kidney disease. Though bone metastases cannot be ruled out findings are not definitive enough to warrant change in  treatment at this time. No other evidence of metastatic RCC progression at this time.  Rpt PET/CT 08/08/2016 with a couple of mildly enlarge LLL pulmonary nodules - no other overt evidence of RCC progression at this time.  Repeat PET CT scan 11/06/2016 - no overt evidence of RCC progression at this time .  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 Confirmed by [place].  #4 mild thrombocytopenia likely related to Sutent- resolved with dose reduction. #5 elevation in bilirubin levels likely due to Sutent. Bilirubin normalized #6 grade 1 fatigue due to Sutent -better with dose reduction and changing schedule to 2 week on and 1 week. #7 Dysguesia from Sutent - manageable per patient. He is trying to eat as well as he can and is maintaining his body weight. #8 RBC macrocytosis due to Sutent 9 chronic kidney disease/single kidney -creatinine improved from 1.9 to 1.7 tp 1.5. Recommended to avoid NSAIDS and other nephrotoxic medications. #10 rheumatoid arthritis -follows with Dr. Estanislado Pandy for mx Plan -Patient has no lab or clinical evidence of disease progression at this time.  - No prohibitive toxicities from Sutent at this time that would indicate medication dose changes. -Mild neutropenia which is in the same ballpark as previously and has been nonprogressive and shall be closely monitored. -Continue current dose of Sutent with close monitoring of labs. --Continue follow-up with primary care physician and rheumatology.  RTC with Dr Irene Limbo with labs in 6 weeks  I spent 20 minutes counseling the patient face to face. The total time spent in the appointment was 25 minutes and more than 50% was on counseling and direct patient cares.    Sullivan Lone MD [place] AAHIVMS [facility] Hematology/Oncology Physician [facility]  (Office):       [phone] (Work cell):  [phone] (Fax):  [phone] 

## 2016-11-25 ENCOUNTER — Telehealth: Payer: Self-pay | Admitting: Hematology

## 2016-11-25 NOTE — Telephone Encounter (Signed)
Completed FMLA/Disability forms and called patient to advise that papers were ready for pickup at the front desk reception, wife stated that she would pickup on 11/13/16

## 2016-12-12 DIAGNOSIS — M24521 Contracture, right elbow: Secondary | ICD-10-CM | POA: Insufficient documentation

## 2016-12-12 NOTE — Progress Notes (Signed)
Office Visit Note  Patient: Dylan Jefferson             Date of Birth: 1951/03/08           MRN: [id number]             PCP: [name] (Inactive) Referring: No ref. provider found Visit Date: 12/22/2016 Occupation: @GUAROCC @    Subjective:  Medication Management (needs PLQ refill has had normal eye exam 11/07/2016)   History of Present Illness: Dylan Jefferson is a 66 y.o. male with history of seronegative inflammatory arthritis. He states she's been taking Plaquenil on regular basis. He is of intermittent swelling in his joints. He continues to have some discomfort in his knee joints and his bilateral hands. This has some discomfort in his ankle joints as well. He's been experiencing some pain in the right infrascapular region. Which is been very painful for him. He describes pain on scale of 0-10 about 9. It is also causing nocturnal pain.  Activities of Daily Living:  Patient reports morning stiffness for 2 hours.   Patient Reports nocturnal pain.  Difficulty dressing/grooming: Denies Difficulty climbing stairs: Denies Difficulty getting out of chair: Denies Difficulty using hands for taps, buttons, cutlery, and/or writing: Denies   Review of Systems  Constitutional: Positive for fatigue. Negative for night sweats and weakness ( ).  HENT: Negative for mouth sores, mouth dryness and nose dryness.   Eyes: Negative for redness and dryness.  Respiratory: Negative for shortness of breath and difficulty breathing.   Cardiovascular: Positive for hypertension. Negative for chest pain, palpitations, irregular heartbeat and swelling in legs/feet.  Gastrointestinal: Negative for constipation and diarrhea.  Endocrine: Negative for increased urination.  Musculoskeletal: Positive for arthralgias, joint pain and morning stiffness. Negative for joint swelling, myalgias, muscle weakness, muscle tenderness and myalgias.  Skin: Negative for color change, rash, hair loss, nodules/bumps,  skin tightness, ulcers and sensitivity to sunlight.  Allergic/Immunologic: Negative for susceptible to infections.  Neurological: Negative for dizziness, fainting, memory loss and night sweats.  Hematological: Negative for swollen glands.  Psychiatric/Behavioral: Positive for sleep disturbance. Negative for depressed mood. The patient is not nervous/anxious.     PMFS History:  Patient Active Problem List   Diagnosis Date Noted  . Contracture, right elbow 12/12/2016  . 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 History of diabetes mellitus 07/24/2016  . History of esophagitis 07/24/2016  . Thyroid cancer ([place]) 05/28/2016  . Hyperlipidemia 05/28/2016  . 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 DDD (degenerative disc disease), cervical 05/28/2016  . DDD (degenerative disc disease), lumbar 05/28/2016  . Osteoarthritis of both knees 05/28/2016  . Osteoarthritis of both hands 05/28/2016  . Peripheral vascular disorder ([place]) 05/28/2016  . 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 Nodule of finger of left hand 10/11/2015  . Lung metastases ([place]) 09/06/2015  . Fatigue 08/16/2015  . Hyperbilirubinemia 08/16/2015  . Renal insufficiency 08/09/2015  . Encounter for chemotherapy management 07/16/2015  . 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 ([place]) 07/12/2015  . Personal history of renal cell cancer 03/29/2014  . Special screening for malignant neoplasms, colon 03/29/2014  . Esophageal varices with bleeding ([place]) 03/29/2014  . RENAL CELL CANCER, METASTATIC 12/04/2007  . Hypothyroidism 12/04/2007  . Diabetes mellitus ([place]) 12/04/2007  . HTN (hypertension) 12/04/2007  . ESOPHAGITIS 12/04/2007  . HEMOCCULT POSITIVE STOOL 12/04/2007  . HEADACHE, CHRONIC 12/04/2007    Past Medical History:  Diagnosis Date  . Arthritis   . Cancer [facility])    Renal cell  .  DDD (degenerative disc disease), cervical 05/28/2016  . DDD (degenerative disc disease), lumbar 05/28/2016  . Depression   . Diabetes  ([place])   . Hyperlipidemia 05/28/2016  . Hypertension   . Hypothyroidism   . 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 Kidney disease   . Nephrolithiasis   . Osteoarthritis of both hands 05/28/2016  . Osteoarthritis of both knees 05/28/2016  . Peripheral vascular disorder ([place]) 05/28/2016  . 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 Rheumatoid arthritis ([place])   . Thyroid cancer ([place]) 05/28/2016    Family History  Problem Relation Age of Onset  . Bleeding Disorder Mother        Unknown what type  . Osteoporosis Mother   . Lymphoma Father        Hodgkin's lymphoma  . Colon cancer Neg Hx    Past Surgical History:  Procedure Laterality Date  . CATARACT EXTRACTION Bilateral 2013  . CHOLECYSTECTOMY    . GASTRECTOMY     tumor removed  . LIPOMA EXCISION Right 1999  . NEPHRECTOMY Right   . PANCREATECTOMY    . SPLENECTOMY    . THYROIDECTOMY    . URETERAL STENT PLACEMENT Left 2012  . WHIPPLE PROCEDURE     Social History   Social History Narrative  . No narrative on file     Objective: Vital Signs: BP (!) 142/72   Pulse 62   Resp 14   Ht 5\' 10"  (1.778 m)   Wt 187 lb (84.8 kg)   BMI 26.83 kg/m    Physical Exam  Constitutional: He is oriented to person, place, and time. He appears well-developed and well-nourished.  HENT:  Head: Normocephalic and atraumatic.  Eyes: Conjunctivae and EOM are normal. Pupils are equal, round, and reactive to light.  Neck: Normal range of motion. Neck supple.  Cardiovascular: Normal rate, regular rhythm and normal heart sounds.   Pulmonary/Chest: Effort normal and breath sounds normal.  Abdominal: Soft. Bowel sounds are normal.  Neurological: He is alert and oriented to person, place, and time.  Skin: Skin is warm and dry. Capillary refill takes less than 2 seconds.  Psychiatric: He has a normal mood and affect. His behavior is normal.  Nursing note and vitals reviewed.    Musculoskeletal Exam:  C-spine and thoracic spine and lumbar spine good range of motion. Shoulder joints with good range of motion. His contracture in his right elbow without any synovitis. No synovitis over her MCP joints was noted. He has PIP/DIP thickening bilaterally consistent with osteoarthritis. Hip joints knee joints ankles MTPs PIPs DIPs with good range of motion with no synovitis. He has some tenderness in the right infrascapular region.  CDAI Exam: CDAI Homunculus Exam:   Joint Counts:  CDAI Tender Joint count: 0 CDAI Swollen Joint count: 0  Global Assessments:  Patient Global Assessment: 5 Provider Global Assessment: 2  CDAI Calculated Score: 7    Investigation: No additional findings. CBC Latest Ref Rng & Units 11/13/2016 09/25/2016 08/14/2016  WBC 4.0 - 10.3 10e3/uL 3.2(L) 4.4 4.0  Hemoglobin 13.0 - 17.1 g/dL 14.3 12.8(L) 12.8(L)  Hematocrit 38.4 - 49.9 % 40.3 37.6(L) 36.6(L)  Platelets 140 - 400 10e3/uL 172 157 204    CMP Latest Ref Rng & Units 11/13/2016 09/25/2016 08/14/2016  Glucose 70 - 140 mg/dl 202(H) 190(H) 134  BUN 7.0 - 26.0 mg/dL 8.7 16.5 13.0  Creatinine 0.7 - 1.3 mg/dL 1.5(H) 1.7(H) 1.9(H)  Sodium 136 - 145 mEq/L 141 143  141  Potassium 3.5 - 5.1 mEq/L 3.7 4.3 4.0  Chloride 101 - 111 mmol/L - - -  CO2 22 - 29 mEq/L 27 25 26   Calcium 8.4 - 10.4 mg/dL 9.0 9.3 9.0  Total Protein 6.4 - 8.3 g/dL 6.2(L) 5.7(L) 5.8(L)  Total Bilirubin 0.20 - 1.20 mg/dL 1.01 1.10 0.82  Alkaline Phos 40 - 150 U/L 86 90 86  AST 5 - 34 U/L 42(H) 25 33  ALT 0 - 55 U/L 49 30 31    Imaging: No results found.  Speciality Comments: No specialty comments available.    Procedures:  No procedures performed Allergies: Patient has no known allergies.   Assessment / Plan:     Visit Diagnoses: Inflammatory polyarthritis ([place]): He has no synovitis on examination he seems to be doing quite well on Plaquenil.  High risk medication use: His eye exam April 2018 was normal. His labs are stable with some  neutropenia probably secondary to  Plaquenil. We will continue to monitor his labs.  Primary osteoarthritis of both hands: He does have severe osteoarthritis in his hands with decreased fist formation.  Primary osteoarthritis of both knees: He continues to have some discomfort in his knee joint. He is no warmth swelling or effusion.  Right infrascapular pain.: I offered x-ray of the thoracic spine patient declined. I also offered physical therapy but she states due to financial reasons he would prefer not to go for physical therapy. I also offered a muscle relaxer but he declined. I've given him a handout on some back exercises for right now. He plans to do some application of heat and ice and exercises for now.  DDD (degenerative disc disease), cervical: Chronic pain  DDD (degenerative disc disease), lumbar: Chronic pain  His other medical problems are listed as follows:  History of hypothyroidism  History of diabetes mellitus  History of esophagitis  Positive QuantiFERON-TB Gold test  Personal history of renal cell cancer  Renal insufficiency  Renal calcinosis  History of renal calculi  History of thyroid cancer  History of malignant neoplasm metastatic to lung  History of peripheral vascular disease  Contracture, right elbow  Nodule of finger of left hand  Hyperbilirubinemia    Orders: No orders of the defined types were placed in this encounter.  Meds ordered this encounter  Medications  . hydroxychloroquine (PLAQUENIL) 200 MG tablet    Sig: 200mg  po qAM and 100mg  po qPM    Dispense:  135 tablet    Refill:  1    Face-to-face time spent with patient was 30 minutes. 50% of time was spent in counseling and coordination of care.  Follow-Up Instructions: Return in about 6 months (around 06/24/2017) for Inflammatory arthritis.   Bo Merino, MD  Note - This record has been created using Editor, commissioning.  Chart creation errors have been sought, but may  not always  have been located. Such creation errors do not reflect on  the standard of medical care.

## 2016-12-22 ENCOUNTER — Encounter: Payer: Self-pay | Admitting: Rheumatology

## 2016-12-22 ENCOUNTER — Ambulatory Visit (INDEPENDENT_AMBULATORY_CARE_PROVIDER_SITE_OTHER): Payer: Medicare Other | Admitting: Rheumatology

## 2016-12-22 VITALS — BP 142/72 | HR 62 | Resp 14 | Ht 70.0 in | Wt 187.0 lb

## 2016-12-22 DIAGNOSIS — Z85118 Personal history of other malignant neoplasm of bronchus and lung: Secondary | ICD-10-CM

## 2016-12-22 DIAGNOSIS — Z8639 Personal history of other endocrine, nutritional and metabolic disease: Secondary | ICD-10-CM | POA: Diagnosis not present

## 2016-12-22 DIAGNOSIS — Z79899 Other long term (current) drug therapy: Secondary | ICD-10-CM | POA: Diagnosis not present

## 2016-12-22 DIAGNOSIS — Z8589 Personal history of malignant neoplasm of other organs and systems: Secondary | ICD-10-CM

## 2016-12-22 DIAGNOSIS — R2232 Localized swelling, mass and lump, left upper limb: Secondary | ICD-10-CM

## 2016-12-22 DIAGNOSIS — R7612 Nonspecific reaction to cell mediated immunity measurement of gamma interferon antigen response without active tuberculosis: Secondary | ICD-10-CM

## 2016-12-22 DIAGNOSIS — M51369 Other intervertebral disc degeneration, lumbar region without mention of lumbar back pain or lower extremity pain: Secondary | ICD-10-CM

## 2016-12-22 DIAGNOSIS — N289 Disorder of kidney and ureter, unspecified: Secondary | ICD-10-CM

## 2016-12-22 DIAGNOSIS — M19041 Primary osteoarthritis, right hand: Secondary | ICD-10-CM

## 2016-12-22 DIAGNOSIS — N29 Other disorders of kidney and ureter in diseases classified elsewhere: Secondary | ICD-10-CM

## 2016-12-22 DIAGNOSIS — Z85528 Personal history of other malignant neoplasm of kidney: Secondary | ICD-10-CM | POA: Diagnosis not present

## 2016-12-22 DIAGNOSIS — M19042 Primary osteoarthritis, left hand: Secondary | ICD-10-CM

## 2016-12-22 DIAGNOSIS — M24521 Contracture, right elbow: Secondary | ICD-10-CM

## 2016-12-22 DIAGNOSIS — M5136 Other intervertebral disc degeneration, lumbar region: Secondary | ICD-10-CM | POA: Diagnosis not present

## 2016-12-22 DIAGNOSIS — Z8719 Personal history of other diseases of the digestive system: Secondary | ICD-10-CM

## 2016-12-22 DIAGNOSIS — Z87442 Personal history of urinary calculi: Secondary | ICD-10-CM

## 2016-12-22 DIAGNOSIS — Z85850 Personal history of malignant neoplasm of thyroid: Secondary | ICD-10-CM

## 2016-12-22 DIAGNOSIS — M5030 Other cervical disc degeneration, unspecified cervical region: Secondary | ICD-10-CM | POA: Diagnosis not present

## 2016-12-22 DIAGNOSIS — M170 Bilateral primary osteoarthritis of knee: Secondary | ICD-10-CM

## 2016-12-22 DIAGNOSIS — M064 Inflammatory polyarthropathy: Secondary | ICD-10-CM | POA: Diagnosis not present

## 2016-12-22 DIAGNOSIS — Z8679 Personal history of other diseases of the circulatory system: Secondary | ICD-10-CM

## 2016-12-22 DIAGNOSIS — Z859 Personal history of malignant neoplasm, unspecified: Secondary | ICD-10-CM

## 2016-12-22 MED ORDER — HYDROXYCHLOROQUINE SULFATE 200 MG PO TABS: ORAL_TABLET | ORAL | 1 refills | Status: DC

## 2016-12-22 NOTE — Progress Notes (Signed)
Rheumatology Medication Review by a Pharmacist Does the patient feel that his/her medications are working for him/her?  Yes Has the patient been experiencing any side effects to the medications prescribed?  No Does the patient have any problems obtaining medications?  No  Issues to address at subsequent visits: None   Pharmacist comments:  Dylan Jefferson is a pleasant 66 yo M who presents for follow up of inflammatory arthritis.  He is currently taking hydroxychloroquine 200 mg in the morning and 100 mg in the evening (dose was reduced on 07/25/16 due to low GFR).  Patient had standing labs on 11/13/16.  Patient currently has labs drawn every 6 weeks with his oncologist.  Patient had eye exam on 11/07/16.  Patient denies any questions or concerns regarding his medications at this time.   Elisabeth Most, Pharm.D., BCPS, CPP Clinical Pharmacist Pager: [phone] Phone: [phone] 12/22/2016 9:15 AM

## 2016-12-22 NOTE — Patient Instructions (Signed)

## 2016-12-24 ENCOUNTER — Other Ambulatory Visit: Payer: Self-pay | Admitting: Rheumatology

## 2016-12-24 ENCOUNTER — Telehealth: Payer: Self-pay | Admitting: Rheumatology

## 2016-12-24 MED ORDER — HYDROXYCHLOROQUINE SULFATE 200 MG PO TABS: ORAL_TABLET | ORAL | 1 refills | Status: DC

## 2016-12-24 NOTE — Telephone Encounter (Signed)
Prescription  For PLQ resent to the [facility] on [address]. Patient advised.

## 2016-12-24 NOTE — Telephone Encounter (Signed)
Patient called stating RX for Plaquenil needs to go to pharmacy [facility] at [place], not a mail order pharmacy. [phone] pharmacy phone number. Please call patient when Rx sent to [facility] so he will know it is there.

## 2016-12-25 ENCOUNTER — Other Ambulatory Visit (HOSPITAL_BASED_OUTPATIENT_CLINIC_OR_DEPARTMENT_OTHER): Payer: Medicare Other

## 2016-12-25 ENCOUNTER — Ambulatory Visit (HOSPITAL_BASED_OUTPATIENT_CLINIC_OR_DEPARTMENT_OTHER): Payer: Medicare Other | Admitting: Hematology

## 2016-12-25 ENCOUNTER — Telehealth: Payer: Self-pay | Admitting: Hematology

## 2016-12-25 ENCOUNTER — Encounter: Payer: Self-pay | Admitting: Hematology

## 2016-12-25 VITALS — BP 154/78 | HR 45 | Temp 98.6°F | Resp 17 | Ht 70.0 in | Wt 187.2 lb

## 2016-12-25 DIAGNOSIS — C649 Malignant neoplasm of unspecified kidney, except renal pelvis: Secondary | ICD-10-CM

## 2016-12-25 DIAGNOSIS — N289 Disorder of kidney and ureter, unspecified: Secondary | ICD-10-CM

## 2016-12-25 DIAGNOSIS — C7900 Secondary malignant neoplasm of unspecified kidney and renal pelvis: Secondary | ICD-10-CM

## 2016-12-25 DIAGNOSIS — C7800 Secondary malignant neoplasm of unspecified lung: Secondary | ICD-10-CM

## 2016-12-25 LAB — COMPREHENSIVE METABOLIC PANEL
ALBUMIN: 4 g/dL (ref 3.5–5.0)
ALK PHOS: 107 U/L (ref 40–150)
ALT: 51 U/L (ref 0–55)
ANION GAP: 8 meq/L (ref 3–11)
AST: 41 U/L — ABNORMAL HIGH (ref 5–34)
BILIRUBIN TOTAL: 1.09 mg/dL (ref 0.20–1.20)
BUN: 16.4 mg/dL (ref 7.0–26.0)
CALCIUM: 9 mg/dL (ref 8.4–10.4)
CO2: 26 meq/L (ref 22–29)
CREATININE: 2 mg/dL — AB (ref 0.7–1.3)
Chloride: 107 mEq/L (ref 98–109)
EGFR: 34 mL/min/{1.73_m2} — ABNORMAL LOW (ref >90)
Glucose: 300 mg/dl — ABNORMAL HIGH (ref 70–140)
Potassium: 4 mEq/L (ref 3.5–5.1)
Sodium: 141 mEq/L (ref 136–145)
TOTAL PROTEIN: 6.1 g/dL — AB (ref 6.4–8.3)

## 2016-12-25 LAB — CBC & DIFF AND RETIC
BASO%: 0.3 % (ref 0.0–2.0)
BASOS ABS: 0 10*3/uL (ref 0.0–0.1)
EOS%: 2.7 % (ref 0.0–7.0)
Eosinophils Absolute: 0.1 10*3/uL (ref 0.0–0.5)
HEMATOCRIT: 38.8 % (ref 38.4–49.9)
HEMOGLOBIN: 13.4 g/dL (ref 13.0–17.1)
Immature Retic Fract: 11.5 % — ABNORMAL HIGH (ref 3.00–10.60)
LYMPH%: 49.6 % — AB (ref 14.0–49.0)
MCH: 38 pg — ABNORMAL HIGH (ref 27.2–33.4)
MCHC: 34.5 g/dL (ref 32.0–36.0)
MCV: 109.9 fL — ABNORMAL HIGH (ref 79.3–98.0)
MONO#: 0.5 10*3/uL (ref 0.1–0.9)
MONO%: 13.6 % (ref 0.0–14.0)
NEUT#: 1.3 10*3/uL — ABNORMAL LOW (ref 1.5–6.5)
NEUT%: 33.8 % — AB (ref 39.0–75.0)
PLATELETS: 164 10*3/uL (ref 140–400)
RBC: 3.53 10*6/uL — ABNORMAL LOW (ref 4.20–5.82)
RDW: 14.5 % (ref 11.0–14.6)
Retic %: 4.62 % — ABNORMAL HIGH (ref 0.80–1.80)
Retic Ct Abs: 163.09 10*3/uL — ABNORMAL HIGH (ref 34.80–93.90)
WBC: 3.8 10*3/uL — ABNORMAL LOW (ref 4.0–10.3)
lymph#: 1.9 10*3/uL (ref 0.9–3.3)

## 2016-12-25 LAB — LACTATE DEHYDROGENASE: LDH: 232 U/L (ref 125–245)

## 2016-12-25 MED ORDER — SUNITINIB MALATE 37.5 MG PO CAPS: ORAL_CAPSULE | ORAL | 1 refills | Status: DC

## 2016-12-25 NOTE — Patient Instructions (Signed)
Thank you for choosing [facility] to provide your oncology and hematology care.  To afford each patient quality time with our providers, please arrive 30 minutes before your scheduled appointment time.  If you arrive late for your appointment, you may be asked to reschedule.  We strive to give you quality time with our providers, and arriving late affects you and other patients whose appointments are after yours.   If you are a no show for multiple scheduled visits, you may be dismissed from the clinic at the providers discretion.    Again, thank you for choosing [facility], our hope is that these requests will decrease the amount of time that you wait before being seen by our physicians.  ______________________________________________________________________  Should you have questions after your visit to the [facility], please contact our office at [phone] between the hours of 8:30 and 4:30 p.m.    Voicemails left after 4:30p.m will not be returned until the following business day.    For prescription refill requests, please have your pharmacy contact us directly.  Please also try to allow 48 hours for prescription requests.    Please contact the scheduling department for questions regarding scheduling.  For scheduling of procedures such as PET scans, CT scans, MRI, Ultrasound, etc please contact central scheduling at [phone].    Resources For Cancer Patients and Caregivers:   Oncolink.org:  A wonderful resource for patients and healthcare providers for information regarding your disease, ways to tract your treatment, what to expect, etc.     American Cancer Society:  800-227-2345  Can help patients locate various types of support and financial assistance  Cancer Care: 1-800-813-HOPE (4673) Provides financial assistance, online support groups, medication/co-pay assistance.    [place] DSS:  336-641-3447 Where to apply for food  stamps, Medicaid, and utility assistance  Medicare Rights Center: 800-333-4114 Helps people with Medicare understand their rights and benefits, navigate the Medicare system, and secure the quality healthcare they deserve  SCAT: 336-333-6589 [place] [employer]'s shared-ride transportation service for eligible riders who have a disability that prevents them from riding the fixed route bus.    For additional information on assistance programs please contact our social worker:   Grier Hock/Abigail Elmore:  [phone]            

## 2016-12-25 NOTE — Progress Notes (Signed)
Received staff message regarding patient whom has financial concerns. Called patient to introduce myself as his Arboriculturist and listened to his concerns. Patient has a lot of prescription drug cost for medications from other physicians. Asked patient if he has applied for Medicaid before. He states he is already on Medicare. Explained to patient that Medicare and Medicaid are different and he may receive both if approved. Asked patient if he is interested in applying. He states yes. Asked patient if I may mail him an application. He states that would be fine. Verified mailing address. Also advised patient there are currently no grant funds available for kidney cancer. Patient has a 0 balance here, therefore does not qualify for hardship settlement. He states he has been paying all of his bills. Advised patient I will mail him a Medicaid application along with my card for any additional financial questions or concerns and he may bring application and documents back for me to fax or turn in to [place]. Patient verbalized understanding.   Placed Medicaid application and additional resource information along([employer]IP) with my card in outgoing mail.

## 2016-12-25 NOTE — Telephone Encounter (Signed)
Gave patient AVS and calender per 5/24 - Central radiology to contact patient with CT schedule.

## 2016-12-25 NOTE — Progress Notes (Signed)
Sutent rx faxed to [facility].  12/25/16

## 2016-12-31 NOTE — Progress Notes (Signed)
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 2) Remote history of metastatic renal cell carcinoma Diagnosed with renal cell carcinoma 20 years ago and had a right nephrectomy. About 8 years after that he was noted to have abdominal recurrence in his pancreas spleen and small intestine and had a Whipple's procedure and significant abdominal surgery and notes that 10 out of 17 lymph nodes were positive. He also had his gallbladder removed. Postoperative course was complicated by an internal hemorrhage as per his report. Patient notes 2-3 years after that he had recurrence in his thyroid that led to a thyroidectomy. [June 2004] later he had partial gastrectomy for local recurrence. [February 2005] when he presented with GI bleeding. Patient notes that he has had no known evidence of recurrence over the last 8 years until his recent CT scan showed lung nodules.  Current Treatment: Sutent 37.5 mg po daily for 2 weeks on and 1 weeks off. S/p 12 cycles  SBRT to dominant RML nodule.  Previous treatment Sutent on cycle 1 - 50 mg by mouth daily for 4 weeks on and 2-weeks off. He was dose adjusted to 37.5 mg by mouth daily for 2 weeks with 1 week of to help mitigate issues with fatigue, mild hyperbilirubinemia, cytopenias.  INTERVAL HISTORY:  Dylan Jefferson is here for for his scheduled follow-up after having completed his 13th cycle of Sutent. Notes no acute new symptoms.  He still has some grade 1 fatigue with the Sutent but is improved and even more manageable .No significant GI symptoms. No fevers or chills. Notes that he is Eating well. Reports that he has been  quite active working in his yard . He is looking forward to spending time with his grandchildren summer. He notes that the cost of his scans and other medical care is adding up since he is in the "doughnut hole" office insurance policy and he prefers to delay the next scans until at least August which is reasonable given the stability of his disease. His labs today are stable. Mild bump in creatinine. He was recommended to drink more water and avoid any nephrotoxic medications.  REVIEW OF SYSTEMS:    10 Point review of systems of done and is negative except as noted above.   Past Medical History:  Diagnosis Date  . Arthritis   . Cancer [facility])    Renal cell  . DDD (degenerative disc disease), cervical 05/28/2016  . DDD (degenerative disc disease), lumbar 05/28/2016  . Depression   . Diabetes ([place])   . Hyperlipidemia 05/28/2016  . Hypertension   . Hypothyroidism   . 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 Kidney disease   . Nephrolithiasis   . Osteoarthritis of both hands 05/28/2016  . Osteoarthritis of both knees 05/28/2016  . Peripheral vascular disorder ([place]) 05/28/2016  . 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 Rheumatoid arthritis ([place])   . Thyroid cancer ([place]) 05/28/2016    . Past Surgical History:  Procedure Laterality Date  . CATARACT EXTRACTION Bilateral 2013  . CHOLECYSTECTOMY    . GASTRECTOMY     tumor removed  . LIPOMA EXCISION Right 1999  . NEPHRECTOMY Right   . PANCREATECTOMY    .  SPLENECTOMY    . THYROIDECTOMY    . URETERAL STENT PLACEMENT Left 2012  . WHIPPLE PROCEDURE      . Social History  Substance Use Topics  . Smoking status: Former Smoker    Years: 1.50  . Smokeless tobacco: Never Used  . Alcohol use Yes     Comment: occasional beer    ALLERGIES:  has No Known Allergies.  MEDICATIONS:  Current Outpatient Prescriptions  Medication Sig Dispense Refill  . amLODipine (NORVASC) 10 MG tablet  Take 1 tablet (10 mg total) by mouth daily. 90 tablet 3  . Ascorbic Acid (VITAMIN C) 1000 MG tablet Take 1,000 mg by mouth daily.    Marland Kitchen aspirin 81 MG tablet Take 81 mg by mouth daily.    Marland Kitchen atenolol (TENORMIN) 100 MG tablet     . augmented betamethasone dipropionate (DIPROLENE-AF) 0.05 % cream   0  . calcium citrate-vitamin D (CITRACAL+D) 315-200 MG-UNIT per tablet Take 2 tablets by mouth daily.     . Cyanocobalamin (VITAMIN B-12) 2500 MCG SUBL Take by mouth daily.     Marland Kitchen docusate sodium (COLACE) 100 MG capsule Take 100 mg by mouth 2 (two) times daily.    . dorzolamide-timolol (COSOPT) 22.3-6.8 MG/ML ophthalmic solution place 1 drop into affected eye twice a day  1  . famotidine (PEPCID) 20 MG tablet Take 20 mg by mouth 2 (two) times daily.    . ferrous sulfate 325 (65 FE) MG tablet Take 325 mg by mouth 2 (two) times daily with a meal.     . Glucosamine-MSM-Hyaluronic Acd (JOINT HEALTH) 750-375-30 MG TABS Take 2 tablets by mouth daily.    . hydroxychloroquine (PLAQUENIL) 200 MG tablet 200mg  po qAM and 100mg  po qPM 135 tablet 1  . insulin lispro (HUMALOG) 100 UNIT/ML injection Inject into the skin 3 (three) times daily before meals.    . Iron-Vitamins (GERITOL COMPLETE) TABS Take 1 tablet by mouth daily.    . Lactobacillus (ACIDOPHILUS) CAPS capsule Take 1 capsule by mouth daily.    Marland Kitchen latanoprost (XALATAN) 0.005 % ophthalmic solution place 1 drop into both eyes every evening  1  . LEVEMIR 100 UNIT/ML injection Inject 7 Units into the skin 2 (two) times daily.   1  . levothyroxine (SYNTHROID, LEVOTHROID) 300 MCG tablet Take 300 mcg by mouth 3 (three) times a week.     . lovastatin (MEVACOR) 40 MG tablet Take 40 mg by mouth at bedtime.     . minoxidil (LONITEN) 10 MG tablet Take 10 mg by mouth 2 (two) times daily.     . Multiple Vitamin (MULTIVITAMIN) tablet Take 1 tablet by mouth daily.    . Omega-3 Fatty Acids (FISH OIL) 1200 MG CAPS Take 1 capsule by mouth daily.    . Pancrelipase, Lip-Prot-Amyl,  (CREON) 6000 UNITS CPEP Take 4 capsules by mouth 3 (three) times daily.    . potassium citrate (UROCIT-K) 10 MEQ (1080 MG) SR tablet Take 10 mEq by mouth 2 (two) times daily.     . SUNItinib (SUTENT) 37.5 MG capsule Take 1 tablet (37.5 mg) by mouth daily for 2 wks followed by 1 wk rest 28 capsule 1  . valsartan-hydrochlorothiazide (DIOVAN-HCT) 160-12.5 MG per tablet     . VOLTAREN 1 % GEL apply 3 grams to LARGE JOINTS three times a day if needed  0   No current facility-administered medications for this visit.     PHYSICAL EXAMINATION: ECOG PERFORMANCE STATUS: 1 - Symptomatic but completely ambulatory  .  Vitals:   12/25/16 1344  BP: (!) 154/78  Pulse: (!) 45  Resp: 17  Temp: 98.6 F (37 C)    Filed Weights   12/25/16 1344  Weight: 187 lb 3.2 oz (84.9 kg)   .Body mass index is 26.86 kg/m.  GENERAL:alert, in no acute distress and comfortable SKIN: skin color, texture, turgor are normal, no rashes or significant lesions EYES: normal, conjunctiva are pink and non-injected, sclera clear OROPHARYNX:no exudate, no erythema and lips, buccal mucosa, and tongue normal  NECK: supple, no JVD, thyroid normal size, non-tender, without nodularity LYMPH:  no palpable lymphadenopathy in the cervical, axillary or inguinal LUNGS: clear to auscultation with normal respiratory effort HEART: regular rate & rhythm,  no murmurs and no lower extremity edema ABDOMEN: abdomen soft, non-tender, normoactive bowel sounds  Musculoskeletal: no cyanosis of digits and no clubbing  PSYCH: alert & oriented x 3 with fluent speech NEURO: no focal motor/sensory deficits  LABORATORY DATA:   I have reviewed the data as listed  . CBC Latest Ref Rng & Units 12/25/2016 11/13/2016 09/25/2016  WBC 4.0 - 10.3 10e3/uL 3.8(L) 3.2(L) 4.4  Hemoglobin 13.0 - 17.1 g/dL 13.4 14.3 12.8(L)  Hematocrit 38.4 - 49.9 % 38.8 40.3 37.6(L)  Platelets 140 - 400 10e3/uL 164 172 157   . CBC    Component Value Date/Time   WBC  3.8 (L) 12/25/2016 1314   WBC 7.3 06/13/2015 0915   RBC 3.53 (L) 12/25/2016 1314   RBC 4.36 06/13/2015 0915   HGB 13.4 12/25/2016 1314   HCT 38.8 12/25/2016 1314   PLT 164 12/25/2016 1314   MCV 109.9 (H) 12/25/2016 1314   MCH 38.0 (H) 12/25/2016 1314   MCH 33.9 06/13/2015 0915   MCHC 34.5 12/25/2016 1314   MCHC 34.3 06/13/2015 0915   RDW 14.5 12/25/2016 1314   LYMPHSABS 1.9 12/25/2016 1314   MONOABS 0.5 12/25/2016 1314   EOSABS 0.1 12/25/2016 1314   BASOSABS 0.0 12/25/2016 1314   . CMP Latest Ref Rng & Units 12/25/2016 11/13/2016 09/25/2016  Glucose 70 - 140 mg/dl 300(H) 202(H) 190(H)  BUN 7.0 - 26.0 mg/dL 16.4 8.7 16.5  Creatinine 0.7 - 1.3 mg/dL 2.0(H) 1.5(H) 1.7(H)  Sodium 136 - 145 mEq/L 141 141 143  Potassium 3.5 - 5.1 mEq/L 4.0 3.7 4.3  Chloride 101 - 111 mmol/L - - -  CO2 22 - 29 mEq/L 26 27 25   Calcium 8.4 - 10.4 mg/dL 9.0 9.0 9.3  Total Protein 6.4 - 8.3 g/dL 6.1(L) 6.2(L) 5.7(L)  Total Bilirubin 0.20 - 1.20 mg/dL 1.09 1.01 1.10  Alkaline Phos 40 - 150 U/L 107 86 90  AST 5 - 34 U/L 41(H) 42(H) 25  ALT 0 - 55 U/L 51 49 30    RADIOGRAPHIC STUDIES: I have personally reviewed the radiological images as listed and agreed with the findings in the report. No results found.   ASSESSMENT & PLAN:  66 year old Caucasian male with  #1 Recurrent metastatic Renal cell carcinoma with multiple lung metastases. Patient is status post Sutent 13 cycles. Has had SBRT to the dominant right lung nodule with partial response. PET/CT scan from 12/27/2015 shows no evidence of disease progression. PET/CT 03/20/2016 - shows some radiation pneumonitis no new pulmonary nodules. Subtle uptake in the ribs which could be from trauma/bone change due to chronic kidney disease. Though bone metastases cannot be ruled out findings are not definitive enough to warrant change in treatment at this time. No other evidence of metastatic RCC progression at this  time.  Rpt PET/CT 08/08/2016 with a couple of  mildly enlarge LLL pulmonary nodules - no other overt evidence of RCC progression at this time.  Repeat PET CT scan 11/06/2016 - no overt evidence of RCC progression at this time .  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 Confirmed by [place].  #4 mild thrombocytopenia likely related to Sutent- resolved with dose reduction. #5 elevation in bilirubin levels likely due to Sutent. Bilirubin normalized #6 grade 1 fatigue due to Sutent -better with dose reduction and changing schedule to 2 week on and 1 week. #7 Dysguesia from Sutent - manageable per patient. He is trying to eat as well as he can and is maintaining his body weight. #8 RBC macrocytosis due to Sutent 9 chronic kidney disease/single kidney -creatinine toward upper part of his rangeof 1.5-2. Recommended to avoid NSAIDS and other nephrotoxic medications. Optimize control of DM2 #10 rheumatoid arthritis -follows with Dr. Estanislado Pandy for mx Plan -Patient has no lab or clinical evidence of disease progression at this time.  - No prohibitive toxicities from Sutent at this time that would indicate medication dose changes. -Continue current dose of Sutent with close monitoring of labs. He prefers to repeat his 6 week labs on the next occasion with his primary care physician and fax [place] the labs CBC, CMP --Continue follow-up with primary care physician and rheumatology.  Labs and PET/CT in 11 weeks RTC with Dr Irene Limbo in 12 weeks  I spent 20 minutes counseling the patient face to face. The total time spent in the appointment was 25 minutes and more than 50% was on counseling and direct patient cares.    Sullivan Lone MD [place] AAHIVMS [facility] Hematology/Oncology Physician [facility]  (Office):       [phone] (Work cell):  [phone] (Fax):           [phone]

## 2017-02-20 ENCOUNTER — Telehealth: Payer: Self-pay

## 2017-02-20 NOTE — Telephone Encounter (Signed)
Pt called to inform [facility] that PET had not yet been authorized so radiology cannot schedule it. inbasket sent to [facility] who is out of office until 7/23. PET due about 8/9. LVM with pt his message was received.

## 2017-03-12 ENCOUNTER — Other Ambulatory Visit (HOSPITAL_BASED_OUTPATIENT_CLINIC_OR_DEPARTMENT_OTHER): Payer: Medicare Other

## 2017-03-12 DIAGNOSIS — C7800 Secondary malignant neoplasm of unspecified lung: Secondary | ICD-10-CM

## 2017-03-12 DIAGNOSIS — C649 Malignant neoplasm of unspecified kidney, except renal pelvis: Secondary | ICD-10-CM

## 2017-03-12 LAB — CBC & DIFF AND RETIC
BASO%: 0.5 % (ref 0.0–2.0)
BASOS ABS: 0 10*3/uL (ref 0.0–0.1)
EOS ABS: 0.1 10*3/uL (ref 0.0–0.5)
EOS%: 2.5 % (ref 0.0–7.0)
HCT: 39.4 % (ref 38.4–49.9)
HGB: 13.5 g/dL (ref 13.0–17.1)
Immature Retic Fract: 10 % (ref 3.00–10.60)
LYMPH#: 1.8 10*3/uL (ref 0.9–3.3)
LYMPH%: 44.9 % (ref 14.0–49.0)
MCH: 37.5 pg — AB (ref 27.2–33.4)
MCHC: 34.3 g/dL (ref 32.0–36.0)
MCV: 109.4 fL — ABNORMAL HIGH (ref 79.3–98.0)
MONO#: 0.4 10*3/uL (ref 0.1–0.9)
MONO%: 8.7 % (ref 0.0–14.0)
NEUT%: 43.4 % (ref 39.0–75.0)
NEUTROS ABS: 1.7 10*3/uL (ref 1.5–6.5)
Platelets: 240 10*3/uL (ref 140–400)
RBC: 3.6 10*6/uL — ABNORMAL LOW (ref 4.20–5.82)
RDW: 12.8 % (ref 11.0–14.6)
RETIC %: 3.66 % — AB (ref 0.80–1.80)
RETIC CT ABS: 131.76 10*3/uL — AB (ref 34.80–93.90)
WBC: 4 10*3/uL (ref 4.0–10.3)

## 2017-03-12 LAB — COMPREHENSIVE METABOLIC PANEL
ALBUMIN: 3.6 g/dL (ref 3.5–5.0)
ALK PHOS: 111 U/L (ref 40–150)
ALT: 35 U/L (ref 0–55)
AST: 35 U/L — AB (ref 5–34)
Anion Gap: 9 mEq/L (ref 3–11)
BILIRUBIN TOTAL: 0.85 mg/dL (ref 0.20–1.20)
BUN: 17.3 mg/dL (ref 7.0–26.0)
CO2: 23 meq/L (ref 22–29)
Calcium: 8.7 mg/dL (ref 8.4–10.4)
Chloride: 108 mEq/L (ref 98–109)
Creatinine: 1.7 mg/dL — ABNORMAL HIGH (ref 0.7–1.3)
EGFR: 41 mL/min/{1.73_m2} — ABNORMAL LOW (ref >90)
GLUCOSE: 144 mg/dL — AB (ref 70–140)
Potassium: 3.9 mEq/L (ref 3.5–5.1)
SODIUM: 139 meq/L (ref 136–145)
Total Protein: 5.7 g/dL — ABNORMAL LOW (ref 6.4–8.3)

## 2017-03-12 LAB — LACTATE DEHYDROGENASE: LDH: 236 U/L (ref 125–245)

## 2017-03-13 ENCOUNTER — Ambulatory Visit (HOSPITAL_COMMUNITY)
Admission: RE | Admit: 2017-03-13 | Discharge: 2017-03-13 | Disposition: A | Discharge status: Home / Self Care | Payer: Medicare Other | Source: Ambulatory Visit | Attending: Hematology | Admitting: Hematology

## 2017-03-13 DIAGNOSIS — S2232XA Fracture of one rib, left side, initial encounter for closed fracture: Secondary | ICD-10-CM | POA: Diagnosis not present

## 2017-03-13 DIAGNOSIS — C7800 Secondary malignant neoplasm of unspecified lung: Secondary | ICD-10-CM | POA: Insufficient documentation

## 2017-03-13 DIAGNOSIS — X58XXXA Exposure to other specified factors, initial encounter: Secondary | ICD-10-CM | POA: Insufficient documentation

## 2017-03-13 DIAGNOSIS — I700 Atherosclerosis of aorta: Secondary | ICD-10-CM | POA: Diagnosis not present

## 2017-03-13 DIAGNOSIS — C649 Malignant neoplasm of unspecified kidney, except renal pelvis: Secondary | ICD-10-CM | POA: Diagnosis present

## 2017-03-13 DIAGNOSIS — N200 Calculus of kidney: Secondary | ICD-10-CM | POA: Insufficient documentation

## 2017-03-13 DIAGNOSIS — I2510 Atherosclerotic heart disease of native coronary artery without angina pectoris: Secondary | ICD-10-CM | POA: Insufficient documentation

## 2017-03-13 LAB — GLUCOSE, CAPILLARY: Glucose-Capillary: 146 mg/dL — ABNORMAL HIGH (ref 65–99)

## 2017-03-13 MED ORDER — FLUDEOXYGLUCOSE F - 18 (FDG) INJECTION
9.7000 | Freq: Once | INTRAVENOUS | Status: AC | PRN
  Administered 2017-03-13: 9.7 via INTRAVENOUS

## 2017-03-18 ENCOUNTER — Other Ambulatory Visit: Payer: Self-pay | Admitting: Hematology

## 2017-03-19 ENCOUNTER — Telehealth: Payer: Self-pay | Admitting: Hematology

## 2017-03-19 ENCOUNTER — Ambulatory Visit (HOSPITAL_BASED_OUTPATIENT_CLINIC_OR_DEPARTMENT_OTHER): Payer: Medicare Other | Admitting: Hematology

## 2017-03-19 ENCOUNTER — Telehealth: Payer: Self-pay

## 2017-03-19 ENCOUNTER — Encounter: Payer: Self-pay | Admitting: Hematology

## 2017-03-19 VITALS — BP 128/71 | HR 57 | Temp 98.3°F | Resp 18 | Ht 70.0 in | Wt 194.8 lb

## 2017-03-19 DIAGNOSIS — C7800 Secondary malignant neoplasm of unspecified lung: Secondary | ICD-10-CM | POA: Diagnosis not present

## 2017-03-19 DIAGNOSIS — C649 Malignant neoplasm of unspecified kidney, except renal pelvis: Secondary | ICD-10-CM | POA: Diagnosis not present

## 2017-03-19 DIAGNOSIS — D696 Thrombocytopenia, unspecified: Secondary | ICD-10-CM

## 2017-03-19 NOTE — Telephone Encounter (Signed)
Scheduled appt per 8/16 los - Gave patient AVS and calender per los.  

## 2017-03-19 NOTE — Patient Instructions (Addendum)
Thank you for choosing [facility] to provide your oncology and hematology care.  To afford each patient quality time with our providers, please arrive 30 minutes before your scheduled appointment time.  If you arrive late for your appointment, you may be asked to reschedule.  We strive to give you quality time with our providers, and arriving late affects you and other patients whose appointments are after yours.  If you are a no show for multiple scheduled visits, you may be dismissed from the clinic at the providers discretion.   Again, thank you for choosing [facility], our hope is that these requests will decrease the amount of time that you wait before being seen by our physicians.  ______________________________________________________________________ Should you have questions after your visit to the [facility], please contact our office at [phone] between the hours of 8:30 and 4:30 p.m.    Voicemails left after 4:30p.m will not be returned until the following business day.   For prescription refill requests, please have your pharmacy contact us directly.  Please also try to allow 48 hours for prescription requests.   Please contact the scheduling department for questions regarding scheduling.  For scheduling of procedures such as PET scans, CT scans, MRI, Ultrasound, etc please contact central scheduling at [phone].   Resources For Cancer Patients and Caregivers:  American Cancer Society:  800-227-2345  Can help patients locate various types of support and financial assistance Cancer Care: 1-800-813-HOPE (4673) Provides financial assistance, online support groups, medication/co-pay assistance.   [place] DSS:  336-641-3447 Where to apply for food stamps, Medicaid, and utility assistance Medicare Rights Center: 800-333-4114 Helps people with Medicare understand their rights and benefits, navigate the Medicare system, and secure the  quality healthcare they deserve SCAT: 336-333-6589 [place] [employer]'s shared-ride transportation service for eligible riders who have a disability that prevents them from riding the fixed route bus.   For additional information on assistance programs please contact our social worker:   Grier Hock/Abigail Elmore:  [phone] 

## 2017-03-19 NOTE — Telephone Encounter (Signed)
Sutent prescription faxed to [facility] per request. Confirmed fax receipt.

## 2017-03-19 NOTE — Progress Notes (Signed)
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 2) Remote history of metastatic renal cell carcinoma Diagnosed with renal cell carcinoma 20 years ago and had a right nephrectomy. About 8 years after that he was noted to have abdominal recurrence in his pancreas spleen and small intestine and had a Whipple's procedure and significant abdominal surgery and notes that 10 out of 17 lymph nodes were positive. He also had his gallbladder removed. Postoperative course was complicated by an internal hemorrhage as per his report. Patient notes 2-3 years after that he had recurrence in his thyroid that led to a thyroidectomy. [June 2004] later he had partial gastrectomy for local recurrence. [February 2005] when he presented with GI bleeding. Patient notes that he has had no known evidence of recurrence over the last 8 years until his recent CT scan showed lung nodules.  Current Treatment: Sutent 37.5 mg po daily for 2 weeks on and 1 weeks off. S/p 14 cycles  SBRT to dominant RML nodule.  Previous treatment Sutent on cycle 1 - 50 mg by mouth daily for 4 weeks on and 2-weeks off. He was dose adjusted to 37.5 mg by mouth daily for 2 weeks with 1 week of to help mitigate issues with fatigue, mild hyperbilirubinemia, cytopenias.  INTERVAL HISTORY:  Dylan Jefferson is here for for his scheduled follow-up after having completed his 14th cycle of Sutent. Notes no acute new symptoms.  He had his grandkids over for 3 weeks in summer and had a very good time and is in very good spirits today. Had a recent PET CT scan that shows no evidence of disease progression. Noted to have  a new left rib fracture which he notes is likely traumatic from him playing rough game of tug of war with his grandkids and had a fall.  REVIEW OF SYSTEMS:    10 Point review of systems of done and is negative except as noted above.   Past Medical History:  Diagnosis Date  . Arthritis   . Cancer [facility])    Renal cell  . DDD (degenerative disc disease), cervical 05/28/2016  . DDD (degenerative disc disease), lumbar 05/28/2016  . Depression   . Diabetes ([place])   . Hyperlipidemia 05/28/2016  . Hypertension   . Hypothyroidism   . 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 Kidney disease   . Nephrolithiasis   . Osteoarthritis of both hands 05/28/2016  . Osteoarthritis of both knees 05/28/2016  . Peripheral vascular disorder ([place]) 05/28/2016  . 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 Rheumatoid arthritis ([place])   . Thyroid cancer ([place]) 05/28/2016    . Past Surgical History:  Procedure Laterality Date  . CATARACT EXTRACTION Bilateral 2013  . CHOLECYSTECTOMY    . GASTRECTOMY     tumor removed  . LIPOMA EXCISION Right 1999  . NEPHRECTOMY Right   . PANCREATECTOMY    . SPLENECTOMY    . THYROIDECTOMY    . URETERAL STENT PLACEMENT Left 2012  . WHIPPLE PROCEDURE      . Social History  Substance Use Topics  . Smoking status: Former Smoker    Years: 1.50  . Smokeless tobacco: Never Used  . Alcohol use Yes  Comment: occasional beer    ALLERGIES:  has No Known Allergies.  MEDICATIONS:  Current Outpatient Prescriptions  Medication Sig Dispense Refill  . amLODipine (NORVASC) 10 MG tablet Take 1 tablet (10 mg total) by mouth daily. 90 tablet 3  . Ascorbic Acid (VITAMIN C) 1000 MG tablet Take 1,000 mg by mouth daily.    Marland Kitchen aspirin 81 MG tablet Take 81 mg by mouth daily.    Marland Kitchen atenolol (TENORMIN) 100 MG tablet     . augmented betamethasone dipropionate (DIPROLENE-AF) 0.05 % cream   0  . calcium citrate-vitamin D (CITRACAL+D) 315-200  MG-UNIT per tablet Take 2 tablets by mouth daily.     . Cyanocobalamin (VITAMIN B-12) 2500 MCG SUBL Take by mouth daily.     Marland Kitchen docusate sodium (COLACE) 100 MG capsule Take 100 mg by mouth 2 (two) times daily.    . dorzolamide-timolol (COSOPT) 22.3-6.8 MG/ML ophthalmic solution place 1 drop into affected eye twice a day  1  . famotidine (PEPCID) 20 MG tablet Take 20 mg by mouth 2 (two) times daily.    . ferrous sulfate 325 (65 FE) MG tablet Take 325 mg by mouth 2 (two) times daily with a meal.     . Glucosamine-MSM-Hyaluronic Acd (JOINT HEALTH) 750-375-30 MG TABS Take 2 tablets by mouth daily.    . hydroxychloroquine (PLAQUENIL) 200 MG tablet 200mg  po qAM and 100mg  po qPM 135 tablet 1  . insulin lispro (HUMALOG) 100 UNIT/ML injection Inject into the skin 3 (three) times daily before meals.    . Iron-Vitamins (GERITOL COMPLETE) TABS Take 1 tablet by mouth daily.    . Lactobacillus (ACIDOPHILUS) CAPS capsule Take 1 capsule by mouth daily.    Marland Kitchen latanoprost (XALATAN) 0.005 % ophthalmic solution place 1 drop into both eyes every evening  1  . LEVEMIR 100 UNIT/ML injection Inject 7 Units into the skin 2 (two) times daily.   1  . levothyroxine (SYNTHROID, LEVOTHROID) 300 MCG tablet Take 300 mcg by mouth 3 (three) times a week.     . lovastatin (MEVACOR) 40 MG tablet Take 40 mg by mouth at bedtime.     . minoxidil (LONITEN) 10 MG tablet Take 10 mg by mouth 2 (two) times daily.     . Multiple Vitamin (MULTIVITAMIN) tablet Take 1 tablet by mouth daily.    . Omega-3 Fatty Acids (FISH OIL) 1200 MG CAPS Take 1 capsule by mouth daily.    . Pancrelipase, Lip-Prot-Amyl, (CREON) 6000 UNITS CPEP Take 4 capsules by mouth 3 (three) times daily.    . potassium citrate (UROCIT-K) 10 MEQ (1080 MG) SR tablet Take 10 mEq by mouth 2 (two) times daily.     . SUNItinib (SUTENT) 37.5 MG capsule Take 1 tablet (37.5 mg) by mouth daily for 2 wks followed by 1 wk rest 28 capsule 1  . valsartan-hydrochlorothiazide (DIOVAN-HCT)  160-12.5 MG per tablet     . VOLTAREN 1 % GEL apply 3 grams to LARGE JOINTS three times a day if needed  0   No current facility-administered medications for this visit.     PHYSICAL EXAMINATION: ECOG PERFORMANCE STATUS: 1 - Symptomatic but completely ambulatory  . Vitals:   03/19/17 0833  BP: 128/71  Pulse: (!) 57  Resp: 18  Temp: 98.3 F (36.8 C)  SpO2: 98%    Filed Weights   03/19/17 0833  Weight: 194 lb 12.8 oz (88.4 kg)   .Body mass index is 27.95 kg/m.  GENERAL:alert, in no acute distress and  comfortable SKIN: skin color, texture, turgor are normal, no rashes or significant lesions EYES: normal, conjunctiva are pink and non-injected, sclera clear OROPHARYNX:no exudate, no erythema and lips, buccal mucosa, and tongue normal  NECK: supple, no JVD, thyroid normal size, non-tender, without nodularity LYMPH:  no palpable lymphadenopathy in the cervical, axillary or inguinal LUNGS: clear to auscultation with normal respiratory effort HEART: regular rate & rhythm,  no murmurs and no lower extremity edema ABDOMEN: abdomen soft, non-tender, normoactive bowel sounds  Musculoskeletal: no cyanosis of digits and no clubbing  PSYCH: alert & oriented x 3 with fluent speech NEURO: no focal motor/sensory deficits  LABORATORY DATA:   I have reviewed the data as listed  . CBC Latest Ref Rng & Units 03/12/2017 12/25/2016 11/13/2016  WBC 4.0 - 10.3 10e3/uL 4.0 3.8(L) 3.2(L)  Hemoglobin 13.0 - 17.1 g/dL 13.5 13.4 14.3  Hematocrit 38.4 - 49.9 % 39.4 38.8 40.3  Platelets 140 - 400 10e3/uL 240 164 172   . CBC    Component Value Date/Time   WBC 4.0 03/12/2017 0748   WBC 7.3 06/13/2015 0915   RBC 3.60 (L) 03/12/2017 0748   RBC 4.36 06/13/2015 0915   HGB 13.5 03/12/2017 0748   HCT 39.4 03/12/2017 0748   PLT 240 03/12/2017 0748   MCV 109.4 (H) 03/12/2017 0748   MCH 37.5 (H) 03/12/2017 0748   MCH 33.9 06/13/2015 0915   MCHC 34.3 03/12/2017 0748   MCHC 34.3 06/13/2015 0915   RDW  12.8 03/12/2017 0748   LYMPHSABS 1.8 03/12/2017 0748   MONOABS 0.4 03/12/2017 0748   EOSABS 0.1 03/12/2017 0748   BASOSABS 0.0 03/12/2017 0748   . CMP Latest Ref Rng & Units 03/12/2017 12/25/2016 11/13/2016  Glucose 70 - 140 mg/dl 144(H) 300(H) 202(H)  BUN 7.0 - 26.0 mg/dL 17.3 16.4 8.7  Creatinine 0.7 - 1.3 mg/dL 1.7(H) 2.0(H) 1.5(H)  Sodium 136 - 145 mEq/L 139 141 141  Potassium 3.5 - 5.1 mEq/L 3.9 4.0 3.7  Chloride 101 - 111 mmol/L - - -  CO2 22 - 29 mEq/L 23 26 27   Calcium 8.4 - 10.4 mg/dL 8.7 9.0 9.0  Total Protein 6.4 - 8.3 g/dL 5.7(L) 6.1(L) 6.2(L)  Total Bilirubin 0.20 - 1.20 mg/dL 0.85 1.09 1.01  Alkaline Phos 40 - 150 U/L 111 107 86  AST 5 - 34 U/L 35(H) 41(H) 42(H)  ALT 0 - 55 U/L 35 51 49    RADIOGRAPHIC STUDIES: I have personally reviewed the radiological images as listed and agreed with the findings in the report. Nm Pet Image Restag (ps) Skull Base To Thigh  Result Date: 03/13/2017 CLINICAL DATA:  Subsequent treatment strategy for metastatic renal cell carcinoma. EXAM: NUCLEAR MEDICINE PET SKULL BASE TO THIGH TECHNIQUE: 9.7 mCi F-18 FDG was injected intravenously. Full-ring PET imaging was performed from the skull base to thigh after the radiotracer. CT data was obtained and used for attenuation correction and anatomic localization. FASTING BLOOD GLUCOSE:  Value: 146 mg/dl COMPARISON:  Multiple exams, including 11/06/2016 FINDINGS: NECK No hypermetabolic lymph nodes in the neck. CHEST Similar morphology of the bandlike density in the right mid lung, maximum SUV 2.3, formerly 2.7. This likely represents radiation fibrosis. No hypermetabolic adenopathy in the chest.  No new nodules. Left main and left anterior descending coronary artery atherosclerosis. Borderline cardiomegaly. Several tiny stable nodules in the 3-4 mm range are present in the right upper lobe and left lower lobe the, and not hypermetabolic but below sensitive PET-CT size thresholds. ABDOMEN/PELVIS Prior Whipple  procedure and splenectomy with right nephrectomy. Scattered physiologic activity in the bowel. There several areas of wall thickening in the rectum with some accentuated metabolic activity but these are new compared to the exam from 4 months ago and accordingly ascribed to peristalsis and physiologic activity rather than suspicious for malignancy. Pneumobilia. Prominent stool throughout the colon favors constipation. Borderline prominence of the prostate gland. Aortoiliac atherosclerotic vascular disease. Stable appearance of 7 mm left kidney lower pole nonobstructive renal calculus. Subcutaneous nodule along the left anterior abdominal wall the level of the iliac crests probably an injection site, maximum SUV 5.5, formerly 4.8. SKELETON Healing fracture of the right lateral fourth rib with some mild adjacent heterotopic ossification, maximum SUV 3.6. This fracture is new compared to the prior exam. No compelling findings of underlying rib lesion aside from the fracture. Focal acute fracture of the left fifth rib anteriorly with cortical buckling on image 60/a. Focal hypermetabolic activity immediately in the vicinity of the fracture with maximum SUV 6.1. The CT features of fracture favor a benign etiology. There is a suggestion of a late phase healing fracture of the left anterior fourth rib which seems to been present on the prior exam, but the fifth rib fracture was not present on the prior exam. IMPRESSION: 1. Right lateral fourth rib and left anterior fifth rib fractures have benign CT imaging characteristics, without compelling findings of an underlying lesion. Both are hypermetabolic and probably from benign healing rib fractures. The left fifth rib fracture is new compared to the prior exam, and the various rib fractures in different states healing are somewhat unusual, query source of trauma. 2. Further reduction in activity along the radiation fibrosis in the right mid lung, without local recurrence  identified. 3. Other imaging findings of potential clinical significance: Prominent stool throughout the colon favors constipation. Aortic Atherosclerosis (ICD10-I70.0). Coronary atherosclerosis. Borderline cardiomegaly. Chronic pneumobilia. Chronic left nephrolithiasis. Electronically Signed   By: Van Clines M.D.   On: 03/13/2017 13:43     ASSESSMENT & PLAN:  66 year old Caucasian male with  #1 Recurrent metastatic Renal cell carcinoma with multiple lung metastases. Patient is status post Sutent 14 cycles. Has had SBRT to the dominant right lung nodule with partial response. PET/CT scan from 12/27/2015 shows no evidence of disease progression. PET/CT 03/20/2016 - shows some radiation pneumonitis no new pulmonary nodules. Subtle uptake in the ribs which could be from trauma/bone change due to chronic kidney disease. Though bone metastases cannot be ruled out findings are not definitive enough to warrant change in treatment at this time. No other evidence of metastatic RCC progression at this time.  PET/CT 08/08/2016 with a couple of mildly enlarge LLL pulmonary nodules - no other overt evidence of RCC progression at this time.  PET CT scan 11/06/2016 - no overt evidence of RCC progression at this time .  PET/CT 03/13/2017 - no overt evidence of RCC progression at this time .  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 Confirmed by [place].  #4 mild thrombocytopenia likely related to Sutent- resolved with dose reduction. #5 elevation in bilirubin levels likely due to Sutent. Bilirubin normalized #6 grade 1 fatigue due to Sutent -better with dose reduction and changing schedule to 2 week on and 1 week. #7 Dysguesia from Sutent - manageable per patient. He is trying to eat as well as he can and is maintaining his body weight. #8 RBC macrocytosis due to Sutent 9 chronic kidney disease/single kidney -creatinine toward  upper part of his rangeof 1.5-2.  Recommended to avoid NSAIDS and other nephrotoxic medications. Optimize control of DM2 #10 rheumatoid arthritis -follows with Dr. Estanislado Pandy for mx Plan -Patient has no lab, clinical or radiographic evidence of disease progression at this time.  -PET/CT scan was reviewed in detail with the patient. He is understandably glad with the results. -No prohibitive toxicities from from Sutent at this time that would indicate medication or medication dose changes. -Continue current dose of Sutent with close monitoring of labs.  --Continue follow-up with primary care physician and rheumatology.  RTC in 6 weeks with labs with Dr Irene Limbo  I spent 20 minutes counseling the patient face to face. The total time spent in the appointment was 25 minutes and more than 50% was on counseling and direct patient cares.    Dylan Lone MD [place] AAHIVMS [facility] Hematology/Oncology Physician [facility]  (Office):       [phone] (Work cell):  [phone] (Fax):           [phone]

## 2017-04-06 ENCOUNTER — Other Ambulatory Visit: Payer: Self-pay | Admitting: Hematology

## 2017-04-29 NOTE — Progress Notes (Signed)
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 2) Remote history of metastatic renal cell carcinoma Diagnosed with renal cell carcinoma 20 years ago and had a right nephrectomy. About 8 years after that he was noted to have abdominal recurrence in his pancreas spleen and small intestine and had a Whipple's procedure and significant abdominal surgery and notes that 10 out of 17 lymph nodes were positive. He also had his gallbladder removed. Postoperative course was complicated by an internal hemorrhage as per his report. Patient notes 2-3 years after that he had recurrence in his thyroid that led to a thyroidectomy. [June 2004] later he had partial gastrectomy for local recurrence. [February 2005] when he presented with GI bleeding. Patient notes that he has had no known evidence of recurrence over the last 8 years until his recent CT scan showed lung nodules.  Current Treatment: Sutent 37.5 mg po daily for 2 weeks on and 1 weeks off. S/p 14 cycles  SBRT to dominant RML nodule.  Previous treatment Sutent on cycle 1 - 50 mg by mouth daily for 4 weeks on and 2-weeks off. He was dose adjusted to 37.5 mg by mouth daily for 2 weeks with 1 week of to help mitigate issues with fatigue, mild hyperbilirubinemia, cytopenias.  INTERVAL HISTORY:  Dylan Jefferson is here for for his scheduled follow-up after having completed his 15th cycle of Sutent. He is unaccompanied today. He is doing well and denies any new concerns since his last visit. Denies mouth sores or any new bone pains. He does note pain in his joints related to the change in the weather.  He reports  swelling in the right ankle secondary to injury while mowing the yard. Stating it is improved today. He has not received a flu shot yet this year but plans to receive one with his PCP in October.  He is interested in postponing his next PET/CT until after the first week of January.    REVIEW OF SYSTEMS:    10 Point review of systems of done and is negative except as noted above.   Past Medical History:  Diagnosis Date  . Arthritis   . Cancer [facility])    Renal cell  . DDD (degenerative disc disease), cervical 05/28/2016  . DDD (degenerative disc disease), lumbar 05/28/2016  . Depression   . Diabetes ([place])   . Hyperlipidemia 05/28/2016  . Hypertension   . Hypothyroidism   . 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 Kidney disease   . Nephrolithiasis   . Osteoarthritis of both hands 05/28/2016  . Osteoarthritis of both knees 05/28/2016  . Peripheral vascular disorder ([place]) 05/28/2016  . 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 Rheumatoid arthritis ([place])   . Thyroid cancer ([place]) 05/28/2016    . Past Surgical History:  Procedure Laterality Date  . CATARACT EXTRACTION Bilateral 2013  . CHOLECYSTECTOMY    . GASTRECTOMY     tumor removed  . LIPOMA EXCISION Right 1999  . NEPHRECTOMY Right   . PANCREATECTOMY    . SPLENECTOMY    . THYROIDECTOMY    . URETERAL STENT PLACEMENT Left 2012  . WHIPPLE PROCEDURE      . Social History  Substance Use Topics  . Smoking status: Former Smoker    Years: 1.50  . Smokeless tobacco: Never Used  . Alcohol use Yes     Comment: occasional beer    ALLERGIES:  has No Known Allergies.  MEDICATIONS:  Current Outpatient Prescriptions  Medication Sig Dispense Refill  . amLODipine (NORVASC) 10 MG tablet take 1 tablet by mouth once daily 90 tablet 3  . Ascorbic Acid (VITAMIN C) 1000 MG tablet Take 1,000 mg by mouth daily.    Marland Kitchen aspirin 81 MG tablet Take 81 mg by mouth daily.    Marland Kitchen atenolol (TENORMIN)  100 MG tablet     . augmented betamethasone dipropionate (DIPROLENE-AF) 0.05 % cream   0  . calcium citrate-vitamin D (CITRACAL+D) 315-200 MG-UNIT per tablet Take 2 tablets by mouth daily.     . Cyanocobalamin (VITAMIN B-12) 2500 MCG SUBL Take by mouth daily.     Marland Kitchen docusate sodium (COLACE) 100 MG capsule Take 100 mg by mouth 2 (two) times daily.    . dorzolamide-timolol (COSOPT) 22.3-6.8 MG/ML ophthalmic solution place 1 drop into affected eye twice a day  1  . famotidine (PEPCID) 20 MG tablet Take 20 mg by mouth 2 (two) times daily.    . ferrous sulfate 325 (65 FE) MG tablet Take 325 mg by mouth 2 (two) times daily with a meal.     . Glucosamine-MSM-Hyaluronic Acd (JOINT HEALTH) 750-375-30 MG TABS Take 2 tablets by mouth daily.    . hydroxychloroquine (PLAQUENIL) 200 MG tablet 200mg  po qAM and 100mg  po qPM 135 tablet 1  . insulin lispro (HUMALOG) 100 UNIT/ML injection Inject into the skin 3 (three) times daily before meals.    . Iron-Vitamins (GERITOL COMPLETE) TABS Take 1 tablet by mouth daily.    . Lactobacillus (ACIDOPHILUS) CAPS capsule Take 1 capsule by mouth daily.    Marland Kitchen latanoprost (XALATAN) 0.005 % ophthalmic solution place 1 drop into both eyes every evening  1  . LEVEMIR 100 UNIT/ML injection Inject 7 Units into the skin 2 (two) times daily.   1  . levothyroxine (SYNTHROID, LEVOTHROID) 300 MCG tablet Take 300 mcg by mouth 3 (three) times a week.     . lovastatin (MEVACOR) 40 MG tablet Take 40 mg by mouth at bedtime.     . minoxidil (LONITEN) 10 MG tablet Take 10 mg by mouth 2 (two) times daily.     . Multiple Vitamin (MULTIVITAMIN) tablet Take 1 tablet by mouth daily.    . Omega-3 Fatty Acids (FISH OIL) 1200 MG CAPS Take 1 capsule by mouth daily.    . Pancrelipase, Lip-Prot-Amyl, (CREON) 6000 UNITS CPEP Take 4 capsules by mouth 3 (three) times daily.    . potassium citrate (UROCIT-K) 10 MEQ (1080 MG) SR tablet Take 10 mEq by mouth 2 (two) times daily.     . SUNItinib (SUTENT) 37.5 MG  capsule Take 1 tablet (37.5 mg) by mouth daily for 2 wks followed by 1 wk rest 28 capsule 1  . valsartan-hydrochlorothiazide (DIOVAN-HCT) 160-12.5 MG per tablet     . VOLTAREN 1 % GEL apply 3 grams to LARGE JOINTS three times a day if needed  0   No current facility-administered medications for this visit.     PHYSICAL EXAMINATION: ECOG PERFORMANCE STATUS: 1 - Symptomatic but completely ambulatory  . Vitals:   04/30/17 0920  BP: 129/61  Pulse: (!) 51  Resp: 17  Temp: 98.2 F (36.8 C)  SpO2: 98%    Filed Weights  04/30/17 0920  Weight: 197 lb 14.4 oz (89.8 kg)   .Body mass index is 28.4 kg/m.  GENERAL:alert, in no acute distress and comfortable SKIN: skin color, texture, turgor are normal, no rashes or significant lesions EYES: normal, conjunctiva are pink and non-injected, sclera clear OROPHARYNX:no exudate, no erythema and lips, buccal mucosa, and tongue normal  NECK: supple, no JVD, thyroid normal size, non-tender, without nodularity LYMPH:  no palpable lymphadenopathy in the cervical, axillary or inguinal LUNGS: clear to auscultation with normal respiratory effort HEART: regular rate & rhythm,  no murmurs and some grade 1 edema on the right side associated with injury ABDOMEN: abdomen soft, non-tender, normoactive bowel sounds  Musculoskeletal: no cyanosis of digits and no clubbing  PSYCH: alert & oriented x 3 with fluent speech NEURO: no focal motor/sensory deficits  LABORATORY DATA:   I have reviewed the data as listed  . CBC Latest Ref Rng & Units 04/30/2017 03/12/2017 12/25/2016  WBC 4.0 - 10.3 10e3/uL 3.8(L) 4.0 3.8(L)  Hemoglobin 13.0 - 17.1 g/dL 12.7(L) 13.5 13.4  Hematocrit 38.4 - 49.9 % 36.9(L) 39.4 38.8  Platelets 140 - 400 10e3/uL 212 240 164   . CBC    Component Value Date/Time   WBC 3.8 (L) 04/30/2017 0832   WBC 7.3 06/13/2015 0915   RBC 3.36 (L) 04/30/2017 0832   RBC 4.36 06/13/2015 0915   HGB 12.7 (L) 04/30/2017 0832   HCT 36.9 (L)  04/30/2017 0832   PLT 212 04/30/2017 0832   MCV 109.8 (H) 04/30/2017 0832   MCH 37.8 (H) 04/30/2017 0832   MCH 33.9 06/13/2015 0915   MCHC 34.4 04/30/2017 0832   MCHC 34.3 06/13/2015 0915   RDW 14.6 04/30/2017 0832   LYMPHSABS 1.6 04/30/2017 0832   MONOABS 0.4 04/30/2017 0832   EOSABS 0.1 04/30/2017 0832   BASOSABS 0.0 04/30/2017 0832   . CMP Latest Ref Rng & Units 04/30/2017 03/12/2017 12/25/2016  Glucose 70 - 140 mg/dl 255(H) 144(H) 300(H)  BUN 7.0 - 26.0 mg/dL 18.5 17.3 16.4  Creatinine 0.7 - 1.3 mg/dL 1.6(H) 1.7(H) 2.0(H)  Sodium 136 - 145 mEq/L 139 139 141  Potassium 3.5 - 5.1 mEq/L 4.2 3.9 4.0  Chloride 101 - 111 mmol/L - - -  CO2 22 - 29 mEq/L 24 23 26   Calcium 8.4 - 10.4 mg/dL 8.6 8.7 9.0  Total Protein 6.4 - 8.3 g/dL 5.7(L) 5.7(L) 6.1(L)  Total Bilirubin 0.20 - 1.20 mg/dL 1.14 0.85 1.09  Alkaline Phos 40 - 150 U/L 86 111 107  AST 5 - 34 U/L 41(H) 35(H) 41(H)  ALT 0 - 55 U/L 29 35 51    RADIOGRAPHIC STUDIES: I have personally reviewed the radiological images as listed and agreed with the findings in the report. No results found.   ASSESSMENT & PLAN:  66 year old Caucasian male with  #1 Recurrent metastatic Renal cell carcinoma with multiple lung metastases. Patient is status post Sutent 14 cycles. Has had SBRT to the dominant right lung nodule with partial response. PET/CT scan from 12/27/2015 shows no evidence of disease progression. PET/CT 03/20/2016 - shows some radiation pneumonitis no new pulmonary nodules. Subtle uptake in the ribs which could be from trauma/bone change due to chronic kidney disease. Though bone metastases cannot be ruled out findings are not definitive enough to warrant change in treatment at this time. No other evidence of metastatic RCC progression at this time.  PET/CT 08/08/2016 with a couple of mildly enlarge LLL pulmonary nodules - no other overt evidence of RCC  progression at this time.  PET CT scan 11/06/2016 - no overt evidence of RCC  progression at this time .  PET/CT 03/13/2017 - no overt evidence of RCC progression at this time .  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 Confirmed by [place].  #4 mild thrombocytopenia likely related to Sutent- resolved with dose reduction. #5 elevation in bilirubin levels likely due to Sutent. Bilirubin normalized #6 grade 1 fatigue due to Sutent -better with dose reduction and changing schedule to 2 week on and 1 week. #7 Dysguesia from Sutent - manageable per patient. He is trying to eat as well as he can and is maintaining his body weight. #8 RBC macrocytosis due to Sutent 9 chronic kidney disease/single kidney - creatinine stable @ 1.6  Recommended to avoid NSAIDS and other nephrotoxic medications. Optimize control of DM2 #10 rheumatoid arthritis -follows with Dr. Estanislado Pandy for mx Plan -Patient has no lab or clinical evidence of disease progression at this time.  -PET/CT scan 03/13/17 was previously reviewed in detail with the patient. He was understandably glad with the results. -No prohibitive toxicities from from Sutent at this time that would indicate medication or medication dose changes. -Continue current dose of Sutent with close monitoring of labs.  --Continue follow-up with primary care physician and rheumatology. --The patient is interested in postponing his next PET/CT until after the first week of January. We will plan accordingly.  RTC in 6 weeks with labs with Dr Irene Limbo  I spent 20 minutes counseling the patient face to face. The total time spent in the appointment was 25 minutes and more than 50% was on counseling and direct patient cares.   This document serves as a record of services personally performed by Sullivan Lone, MD. It was created on his behalf by Arlyce Harman, a trained medical scribe. The creation of this record is based on the scribe's personal observations and the provider's statements to them. This document  has been checked and approved by the attending provider.  Sullivan Lone MD [place] AAHIVMS [facility] Hematology/Oncology Physician [facility]  (Office):       [phone] (Work cell):  [phone] (Fax):           [phone]

## 2017-04-30 ENCOUNTER — Ambulatory Visit (HOSPITAL_BASED_OUTPATIENT_CLINIC_OR_DEPARTMENT_OTHER): Payer: Medicare Other | Admitting: Hematology

## 2017-04-30 ENCOUNTER — Other Ambulatory Visit (HOSPITAL_BASED_OUTPATIENT_CLINIC_OR_DEPARTMENT_OTHER): Payer: Medicare Other

## 2017-04-30 ENCOUNTER — Telehealth: Payer: Self-pay | Admitting: Hematology

## 2017-04-30 VITALS — BP 129/61 | HR 51 | Temp 98.2°F | Resp 17 | Ht 70.0 in | Wt 197.9 lb

## 2017-04-30 DIAGNOSIS — C649 Malignant neoplasm of unspecified kidney, except renal pelvis: Secondary | ICD-10-CM

## 2017-04-30 DIAGNOSIS — C7800 Secondary malignant neoplasm of unspecified lung: Secondary | ICD-10-CM | POA: Diagnosis not present

## 2017-04-30 LAB — CBC & DIFF AND RETIC
BASO%: 0.3 % (ref 0.0–2.0)
Basophils Absolute: 0 10*3/uL (ref 0.0–0.1)
EOS%: 1.3 % (ref 0.0–7.0)
Eosinophils Absolute: 0.1 10*3/uL (ref 0.0–0.5)
HEMATOCRIT: 36.9 % — AB (ref 38.4–49.9)
HGB: 12.7 g/dL — ABNORMAL LOW (ref 13.0–17.1)
Immature Retic Fract: 6.9 % (ref 3.00–10.60)
LYMPH#: 1.6 10*3/uL (ref 0.9–3.3)
LYMPH%: 42.6 % (ref 14.0–49.0)
MCH: 37.8 pg — AB (ref 27.2–33.4)
MCHC: 34.4 g/dL (ref 32.0–36.0)
MCV: 109.8 fL — ABNORMAL HIGH (ref 79.3–98.0)
MONO#: 0.4 10*3/uL (ref 0.1–0.9)
MONO%: 11 % (ref 0.0–14.0)
NEUT%: 44.8 % (ref 39.0–75.0)
NEUTROS ABS: 1.7 10*3/uL (ref 1.5–6.5)
Platelets: 212 10*3/uL (ref 140–400)
RBC: 3.36 10*6/uL — AB (ref 4.20–5.82)
RDW: 14.6 % (ref 11.0–14.6)
RETIC %: 2.5 % — AB (ref 0.80–1.80)
Retic Ct Abs: 84 10*3/uL (ref 34.80–93.90)
WBC: 3.8 10*3/uL — AB (ref 4.0–10.3)
nRBC: 0 % (ref 0–0)

## 2017-04-30 LAB — COMPREHENSIVE METABOLIC PANEL
ALT: 29 U/L (ref 0–55)
ANION GAP: 7 meq/L (ref 3–11)
AST: 41 U/L — AB (ref 5–34)
Albumin: 3.4 g/dL — ABNORMAL LOW (ref 3.5–5.0)
Alkaline Phosphatase: 86 U/L (ref 40–150)
BUN: 18.5 mg/dL (ref 7.0–26.0)
CALCIUM: 8.6 mg/dL (ref 8.4–10.4)
CO2: 24 mEq/L (ref 22–29)
Chloride: 108 mEq/L (ref 98–109)
Creatinine: 1.6 mg/dL — ABNORMAL HIGH (ref 0.7–1.3)
EGFR: 43 mL/min/{1.73_m2} — ABNORMAL LOW (ref >90)
Glucose: 255 mg/dl — ABNORMAL HIGH (ref 70–140)
POTASSIUM: 4.2 meq/L (ref 3.5–5.1)
Sodium: 139 mEq/L (ref 136–145)
Total Bilirubin: 1.14 mg/dL (ref 0.20–1.20)
Total Protein: 5.7 g/dL — ABNORMAL LOW (ref 6.4–8.3)

## 2017-04-30 LAB — LACTATE DEHYDROGENASE: LDH: 224 U/L (ref 125–245)

## 2017-04-30 NOTE — Telephone Encounter (Signed)
Scheduled appt per 9/27 los - Gave patient AVS and calender per los.

## 2017-06-11 ENCOUNTER — Other Ambulatory Visit (HOSPITAL_BASED_OUTPATIENT_CLINIC_OR_DEPARTMENT_OTHER): Payer: Medicare Other

## 2017-06-11 ENCOUNTER — Ambulatory Visit: Payer: Medicare Other | Admitting: Hematology

## 2017-06-11 ENCOUNTER — Encounter: Payer: Self-pay | Admitting: Hematology

## 2017-06-11 ENCOUNTER — Telehealth: Payer: Self-pay | Admitting: Hematology

## 2017-06-11 VITALS — BP 127/62 | HR 51 | Temp 97.9°F | Resp 17 | Ht 70.0 in | Wt 197.9 lb

## 2017-06-11 DIAGNOSIS — C7800 Secondary malignant neoplasm of unspecified lung: Secondary | ICD-10-CM

## 2017-06-11 DIAGNOSIS — C649 Malignant neoplasm of unspecified kidney, except renal pelvis: Secondary | ICD-10-CM

## 2017-06-11 DIAGNOSIS — Z7189 Other specified counseling: Secondary | ICD-10-CM

## 2017-06-11 DIAGNOSIS — C7900 Secondary malignant neoplasm of unspecified kidney and renal pelvis: Secondary | ICD-10-CM

## 2017-06-11 LAB — CBC & DIFF AND RETIC
BASO%: 0.2 % (ref 0.0–2.0)
Basophils Absolute: 0 10e3/uL (ref 0.0–0.1)
EOS%: 1.7 % (ref 0.0–7.0)
Eosinophils Absolute: 0.1 10e3/uL (ref 0.0–0.5)
HCT: 40.7 % (ref 38.4–49.9)
HGB: 14 g/dL (ref 13.0–17.1)
Immature Retic Fract: 9.1 % (ref 3.00–10.60)
LYMPH%: 38 % (ref 14.0–49.0)
MCH: 38.6 pg — ABNORMAL HIGH (ref 27.2–33.4)
MCHC: 34.4 g/dL (ref 32.0–36.0)
MCV: 112.1 fL — ABNORMAL HIGH (ref 79.3–98.0)
MONO#: 0.5 10e3/uL (ref 0.1–0.9)
MONO%: 10.7 % (ref 0.0–14.0)
NEUT#: 2.1 10e3/uL (ref 1.5–6.5)
NEUT%: 49.4 % (ref 39.0–75.0)
Platelets: 223 10e3/uL (ref 140–400)
RBC: 3.63 10e6/uL — ABNORMAL LOW (ref 4.20–5.82)
RDW: 13.9 % (ref 11.0–14.6)
Retic %: 3.39 % — ABNORMAL HIGH (ref 0.80–1.80)
Retic Ct Abs: 123.06 10e3/uL — ABNORMAL HIGH (ref 34.80–93.90)
WBC: 4.2 10e3/uL (ref 4.0–10.3)
lymph#: 1.6 10e3/uL (ref 0.9–3.3)

## 2017-06-11 LAB — COMPREHENSIVE METABOLIC PANEL WITH GFR
ALT: 32 U/L (ref 0–55)
AST: 30 U/L (ref 5–34)
Albumin: 3.8 g/dL (ref 3.5–5.0)
Alkaline Phosphatase: 86 U/L (ref 40–150)
Anion Gap: 9 meq/L (ref 3–11)
BUN: 18.8 mg/dL (ref 7.0–26.0)
CO2: 20 meq/L — ABNORMAL LOW (ref 22–29)
Calcium: 8.8 mg/dL (ref 8.4–10.4)
Chloride: 112 meq/L — ABNORMAL HIGH (ref 98–109)
Creatinine: 1.7 mg/dL — ABNORMAL HIGH (ref 0.7–1.3)
EGFR: 41 ml/min/1.73 m2 — ABNORMAL LOW
Glucose: 180 mg/dL — ABNORMAL HIGH (ref 70–140)
Potassium: 4.3 meq/L (ref 3.5–5.1)
Sodium: 141 meq/L (ref 136–145)
Total Bilirubin: 1.43 mg/dL — ABNORMAL HIGH (ref 0.20–1.20)
Total Protein: 6.2 g/dL — ABNORMAL LOW (ref 6.4–8.3)

## 2017-06-11 LAB — LACTATE DEHYDROGENASE: LDH: 203 U/L (ref 125–245)

## 2017-06-11 NOTE — Progress Notes (Signed)
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 2) Remote history of metastatic renal cell carcinoma Diagnosed with renal cell carcinoma 20 years ago and had a right nephrectomy. About 8 years after that he was noted to have abdominal recurrence in his pancreas spleen and small intestine and had a Whipple's procedure and significant abdominal surgery and notes that 10 out of 17 lymph nodes were positive. He also had his gallbladder removed. Postoperative course was complicated by an internal hemorrhage as per his report. Patient notes 2-3 years after that he had recurrence in his thyroid that led to a thyroidectomy. [June 2004] later he had partial gastrectomy for local recurrence. [February 2005] when he presented with GI bleeding. Patient notes that he has had no known evidence of recurrence over the last 8 years until his recent CT scan showed lung nodules.  Current Treatment: Sutent 37.5 mg po daily for 2 weeks on and 1 weeks off. S/p 14 cycles   Previous treatment Sutent on cycle 1 - 50 mg by mouth daily for 4 weeks on and 2-weeks off. He was dose adjusted to 37.5 mg by mouth daily for 2 weeks with 1 week of to help mitigate issues with fatigue, mild hyperbilirubinemia, cytopenias. SBRT to dominant RML nodule.  INTERVAL HISTORY:  Dylan Jefferson is here for for his scheduled follow-up with his wife. Overall, life has been doing well for him in the interim. Outside of his normal aches and pains with his arthritis he reports that he has not had any acute complaints. His taste continues to be diminished, however, he reports that his appetite has been well  and he has maintained adequate food intake. His counts have continued to remain stable. He is interested in postponing his next PET/CT until after the first week of January.    REVIEW OF SYSTEMS:    A 10+ POINT REVIEW OF SYSTEMS WAS OBTAINED including neurology, dermatology, psychiatry, cardiac, respiratory, lymph, extremities, GI, GU, Musculoskeletal, constitutional, breasts, reproductive, HEENT.  All pertinent positives are noted in the HPI.  All others are negative.  Past Medical History:  Diagnosis Date  . Arthritis   . Cancer [facility])    Renal cell  . DDD (degenerative disc disease), cervical 05/28/2016  . DDD (degenerative disc disease), lumbar 05/28/2016  . Depression   . Diabetes ([place])   . Hyperlipidemia 05/28/2016  . Hypertension   . Hypothyroidism   . 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 Kidney disease   . Nephrolithiasis   . Osteoarthritis of both hands 05/28/2016  . Osteoarthritis of both knees 05/28/2016  . Peripheral vascular disorder ([place]) 05/28/2016  . 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 Rheumatoid arthritis ([place])   . Thyroid cancer ([place]) 05/28/2016    . Past Surgical History:  Procedure Laterality Date  . CATARACT EXTRACTION Bilateral 2013  . CHOLECYSTECTOMY    . GASTRECTOMY     tumor removed  . LIPOMA EXCISION Right 1999  . NEPHRECTOMY Right   . PANCREATECTOMY    . SPLENECTOMY    . THYROIDECTOMY    . URETERAL STENT PLACEMENT Left 2012  . WHIPPLE PROCEDURE      . Social History   Tobacco Use  .  Smoking status: Former Smoker    Years: 1.50  . Smokeless tobacco: Never Used  Substance Use Topics  . Alcohol use: Yes    Comment: occasional beer  . Drug use: No    ALLERGIES:  has No Known Allergies.  MEDICATIONS:  Current Outpatient Medications  Medication Sig Dispense Refill  . amLODipine (NORVASC) 10 MG tablet take 1 tablet by mouth once daily 90 tablet 3  . Ascorbic Acid (VITAMIN C) 1000  MG tablet Take 1,000 mg by mouth daily.    Marland Kitchen aspirin 81 MG tablet Take 81 mg by mouth daily.    Marland Kitchen atenolol (TENORMIN) 100 MG tablet     . augmented betamethasone dipropionate (DIPROLENE-AF) 0.05 % cream   0  . calcium citrate-vitamin D (CITRACAL+D) 315-200 MG-UNIT per tablet Take 2 tablets by mouth daily.     . Cyanocobalamin (VITAMIN B-12) 2500 MCG SUBL Take by mouth daily.     Marland Kitchen docusate sodium (COLACE) 100 MG capsule Take 100 mg by mouth 2 (two) times daily.    . dorzolamide-timolol (COSOPT) 22.3-6.8 MG/ML ophthalmic solution place 1 drop into affected eye twice a day  1  . famotidine (PEPCID) 20 MG tablet Take 20 mg by mouth 2 (two) times daily.    . ferrous sulfate 325 (65 FE) MG tablet Take 325 mg by mouth 2 (two) times daily with a meal.     . Glucosamine-MSM-Hyaluronic Acd (JOINT HEALTH) 750-375-30 MG TABS Take 2 tablets by mouth daily.    . hydroxychloroquine (PLAQUENIL) 200 MG tablet 200mg  po qAM and 100mg  po qPM 135 tablet 1  . insulin lispro (HUMALOG) 100 UNIT/ML injection Inject into the skin 3 (three) times daily before meals.    . Iron-Vitamins (GERITOL COMPLETE) TABS Take 1 tablet by mouth daily.    . Lactobacillus (ACIDOPHILUS) CAPS capsule Take 1 capsule by mouth daily.    Marland Kitchen latanoprost (XALATAN) 0.005 % ophthalmic solution place 1 drop into both eyes every evening  1  . LEVEMIR 100 UNIT/ML injection Inject 7 Units into the skin 2 (two) times daily.   1  . levothyroxine (SYNTHROID, LEVOTHROID) 300 MCG tablet Take 300 mcg by mouth 3 (three) times a week.     . lovastatin (MEVACOR) 40 MG tablet Take 40 mg by mouth at bedtime.     . minoxidil (LONITEN) 10 MG tablet Take 10 mg by mouth 2 (two) times daily.     . Multiple Vitamin (MULTIVITAMIN) tablet Take 1 tablet by mouth daily.    . Omega-3 Fatty Acids (FISH OIL) 1200 MG CAPS Take 1 capsule by mouth daily.    . Pancrelipase, Lip-Prot-Amyl, (CREON) 6000 UNITS CPEP Take 4 capsules by mouth 3 (three) times daily.    . potassium  citrate (UROCIT-K) 10 MEQ (1080 MG) SR tablet Take 10 mEq by mouth 2 (two) times daily.     . SUNItinib (SUTENT) 37.5 MG capsule Take 1 tablet (37.5 mg) by mouth daily for 2 wks followed by 1 wk rest 28 capsule 1  . valsartan-hydrochlorothiazide (DIOVAN-HCT) 160-12.5 MG per tablet     . VOLTAREN 1 % GEL apply 3 grams to LARGE JOINTS three times a day if needed  0   No current facility-administered medications for this visit.     PHYSICAL EXAMINATION: ECOG PERFORMANCE STATUS: 1 - Symptomatic but completely ambulatory  . Vitals:   06/11/17 0855  BP: 127/62  Pulse: (!) 51  Resp: 17  Temp: 97.9 F (36.6 C)  SpO2: 98%  Filed Weights   06/11/17 0855  Weight: 197 lb 14.4 oz (89.8 kg)   .Body mass index is 28.4 kg/m.  GENERAL:alert, in no acute distress and comfortable SKIN: skin color, texture, turgor are normal, no rashes or significant lesions EYES: normal, conjunctiva are pink and non-injected, sclera clear OROPHARYNX:no exudate, no erythema and lips, buccal mucosa, and tongue normal  NECK: supple, no JVD, thyroid normal size, non-tender, without nodularity LYMPH:  no palpable lymphadenopathy in the cervical, axillary or inguinal LUNGS: clear to auscultation with normal respiratory effort HEART: regular rate & rhythm,  no murmurs and some grade 1 edema on the right side associated with injury ABDOMEN: abdomen soft, non-tender, normoactive bowel sounds  Musculoskeletal: no cyanosis of digits and no clubbing  PSYCH: alert & oriented x 3 with fluent speech NEURO: no focal motor/sensory deficits  LABORATORY DATA:   I have reviewed the data as listed  . CBC Latest Ref Rng & Units 06/11/2017 04/30/2017 03/12/2017  WBC 4.0 - 10.3 10e3/uL 4.2 3.8(L) 4.0  Hemoglobin 13.0 - 17.1 g/dL 14.0 12.7(L) 13.5  Hematocrit 38.4 - 49.9 % 40.7 36.9(L) 39.4  Platelets 140 - 400 10e3/uL 223 212 240   . CBC    Component Value Date/Time   WBC 4.2 06/11/2017 0808   WBC 7.3 06/13/2015 0915    RBC 3.63 (L) 06/11/2017 0808   RBC 4.36 06/13/2015 0915   HGB 14.0 06/11/2017 0808   HCT 40.7 06/11/2017 0808   PLT 223 06/11/2017 0808   MCV 112.1 (H) 06/11/2017 0808   MCH 38.6 (H) 06/11/2017 0808   MCH 33.9 06/13/2015 0915   MCHC 34.4 06/11/2017 0808   MCHC 34.3 06/13/2015 0915   RDW 13.9 06/11/2017 0808   LYMPHSABS 1.6 06/11/2017 0808   MONOABS 0.5 06/11/2017 0808   EOSABS 0.1 06/11/2017 0808   BASOSABS 0.0 06/11/2017 0808   . CMP Latest Ref Rng & Units 06/11/2017 04/30/2017 03/12/2017  Glucose 70 - 140 mg/dl 180(H) 255(H) 144(H)  BUN 7.0 - 26.0 mg/dL 18.8 18.5 17.3  Creatinine 0.7 - 1.3 mg/dL 1.7(H) 1.6(H) 1.7(H)  Sodium 136 - 145 mEq/L 141 139 139  Potassium 3.5 - 5.1 mEq/L 4.3 4.2 3.9  Chloride 101 - 111 mmol/L - - -  CO2 22 - 29 mEq/L 20(L) 24 23  Calcium 8.4 - 10.4 mg/dL 8.8 8.6 8.7  Total Protein 6.4 - 8.3 g/dL 6.2(L) 5.7(L) 5.7(L)  Total Bilirubin 0.20 - 1.20 mg/dL 1.43(H) 1.14 0.85  Alkaline Phos 40 - 150 U/L 86 86 111  AST 5 - 34 U/L 30 41(H) 35(H)  ALT 0 - 55 U/L 32 29 35    RADIOGRAPHIC STUDIES: I have personally reviewed the radiological images as listed and agreed with the findings in the report. No results found.   ASSESSMENT & PLAN:  66 year old Caucasian male with  #1 Recurrent metastatic Renal cell carcinoma with multiple lung metastases. Patient is status post Sutent 14 cycles. Has had SBRT to the dominant right lung nodule with partial response. PET/CT scan from 12/27/2015 shows no evidence of disease progression. PET/CT 03/20/2016 - shows some radiation pneumonitis no new pulmonary nodules. Subtle uptake in the ribs which could be from trauma/bone change due to chronic kidney disease. Though bone metastases cannot be ruled out findings are not definitive enough to warrant change in treatment at this time. No other evidence of metastatic RCC progression at this time.  PET/CT 08/08/2016 with a couple of mildly enlarge LLL pulmonary nodules - no other  overt evidence  of RCC progression at this time.  PET CT scan 11/06/2016 - no overt evidence of RCC progression at this time .  PET/CT 03/13/2017 - no overt evidence of RCC progression at this time .  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 Confirmed by [place].  #4 mild thrombocytopenia likely related to Sutent- resolved with dose reduction. #5 elevation in bilirubin levels likely due to Sutent. Bilirubin normalized #6 grade 1 fatigue due to Sutent -better with dose reduction and changing schedule to 2 week on and 1 week. #7 Dysguesia from Sutent - manageable per patient. He is trying to eat as well as he can and is maintaining his body weight. #8 RBC macrocytosis due to Sutent 9 chronic kidney disease/single kidney - creatinine stable @ 1.6  Recommended to avoid NSAIDS and other nephrotoxic medications. Optimize control of DM2 #10 rheumatoid arthritis -follows with Dr. Estanislado Pandy for mx Plan -Patient has no lab or clinical evidence of disease progression at this time.  -Continue current dose of Sutent with close monitoring of labs. No prohibitive toxicities at this time.  -Continue follow-up with primary care physician and rheumatology. -The patient is interested in postponing his next PET/CT until after the first week of January. We will plan accordingly. -Counts have continued to remain stable and he appears well in clinic.    RTC in the first week of the next year with labs and PET scan  I spent 20 minutes counseling the patient face to face. The total time spent in the appointment was 25 minutes and more than 50% was on counseling and direct patient cares.  Sullivan Lone MD [place] AAHIVMS [facility] Hematology/Oncology Physician [place]  (Office):       [phone] (Work cell):  [phone] (Fax):           [phone]  This document serves as a record of services personally performed by Sullivan Lone, MD. It was created on  his behalf by Reola Mosher, a trained medical scribe. The creation of this record is based on the scribe's personal observations and the provider's statements to them. This document has been checked and approved by the attending provider.  .I have reviewed the above documentation for accuracy and completeness, and I agree with the above. Brunetta Genera MD

## 2017-06-11 NOTE — Telephone Encounter (Signed)
Scheduled appt per 11/8 los - Gave patient AVS and calender per los.  

## 2017-06-19 IMAGING — CT CT CHEST W/O CM
2 of 3 series · 15 of 36 positions shown, 18 images · non-contrast
Comparison: By report from 05/02/2015

CLINICAL DATA: History of right lung mass by outside chest x-ray,
history of prior nephrectomy for renal cell carcinoma

EXAM:
CT CHEST WITHOUT CONTRAST
TECHNIQUE: Multidetector CT imaging of the chest was performed following the
standard protocol without IV contrast.

[Series 3: chest w/(date) · axial · 0.76mm/px · z∈[-368,-43]mm · 12 of 77 slices shown, 15 images]
[im 6/77  mediastinal]
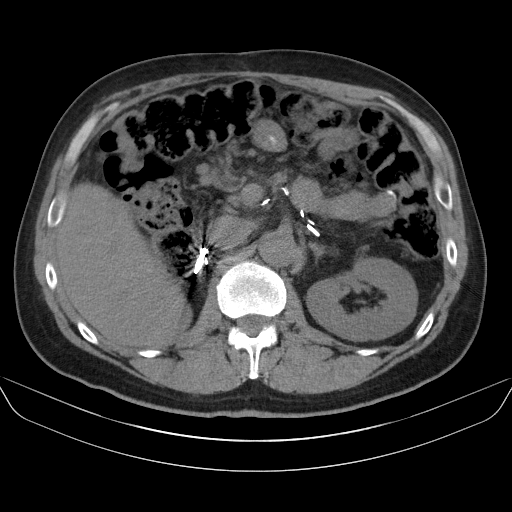
[im 6/77  lung]
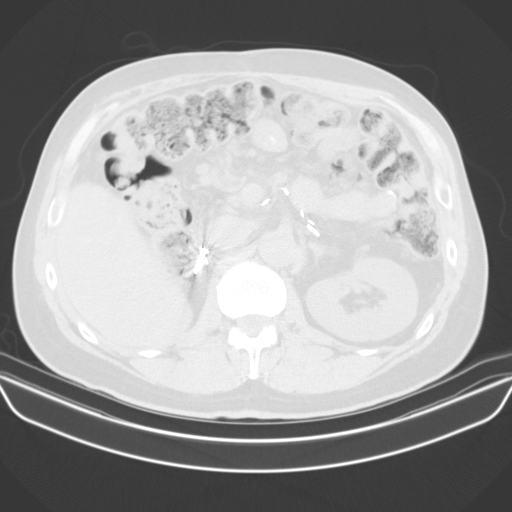
[im 12/77  lung]
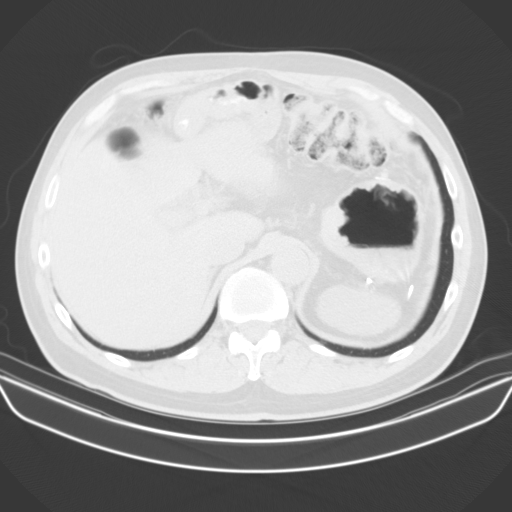
[im 17/77  lung]
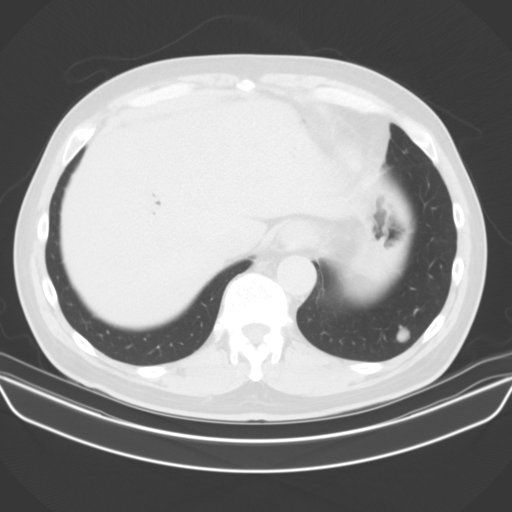
[im 23/77  lung]
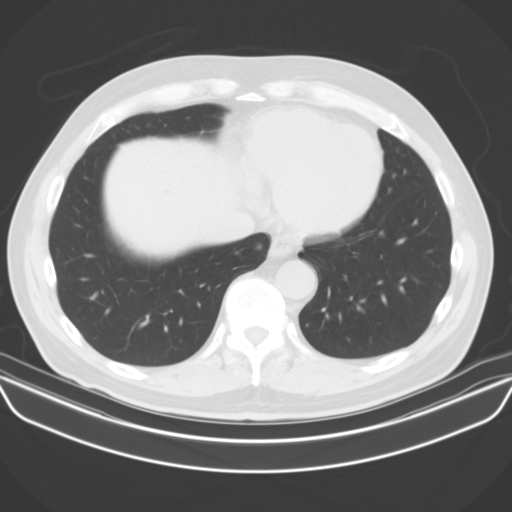
[im 29/77  mediastinal]
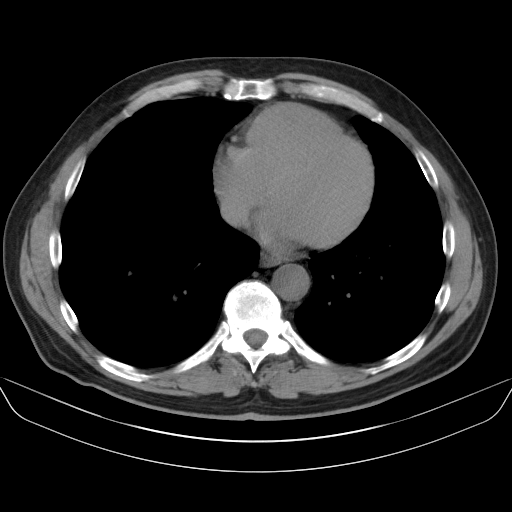
[im 29/77  lung]
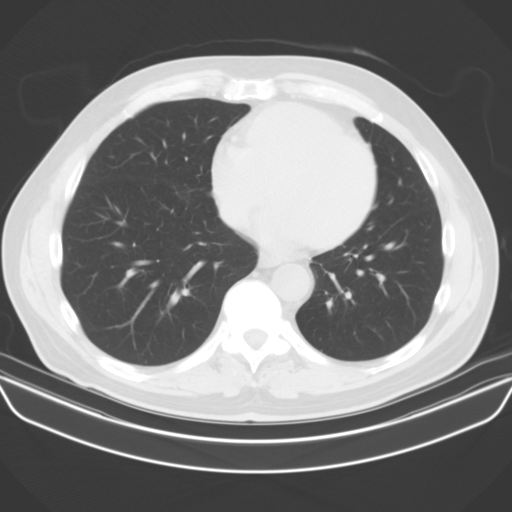
[im 34/77  lung]
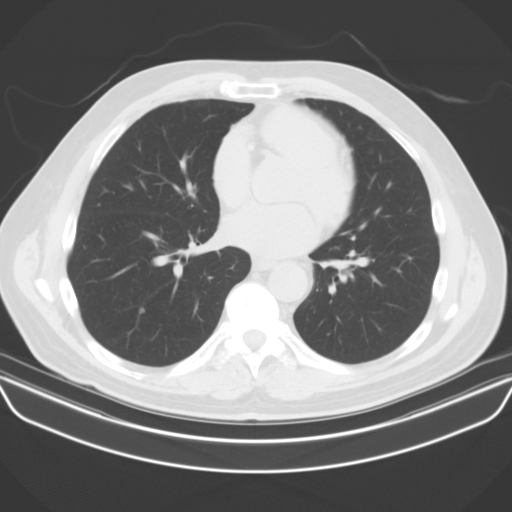
[im 43/77  lung]
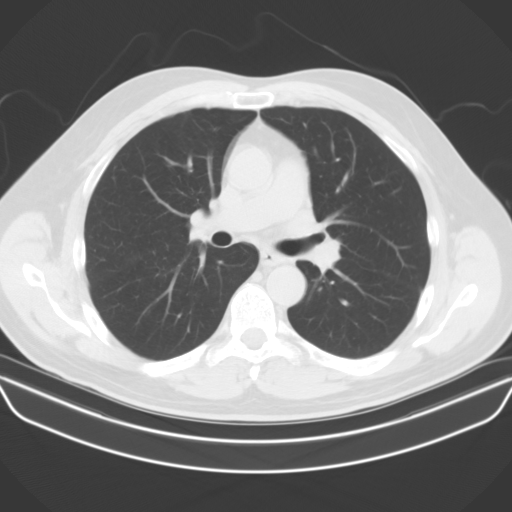
[im 48/77  lung]
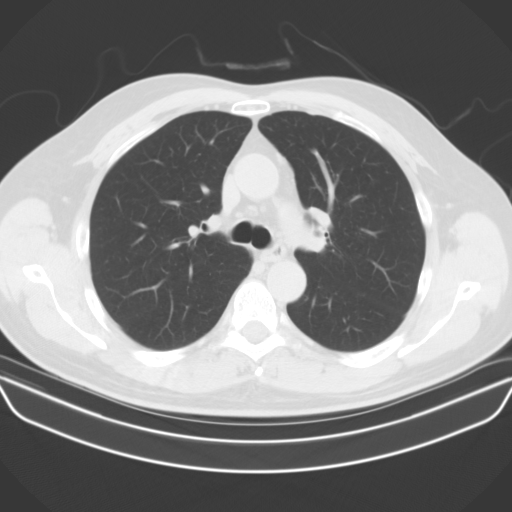
[im 54/77  mediastinal]
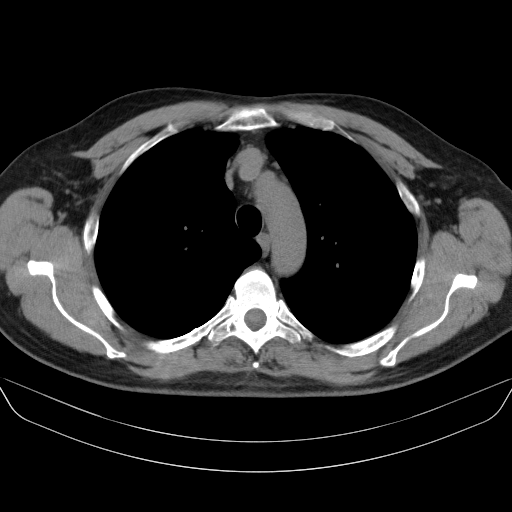
[im 54/77  lung]
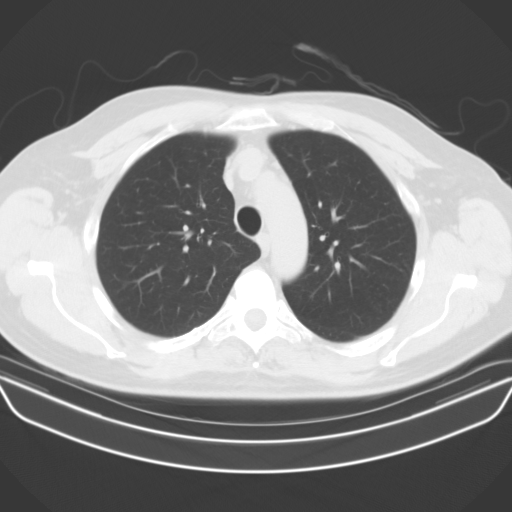
[im 60/77  lung]
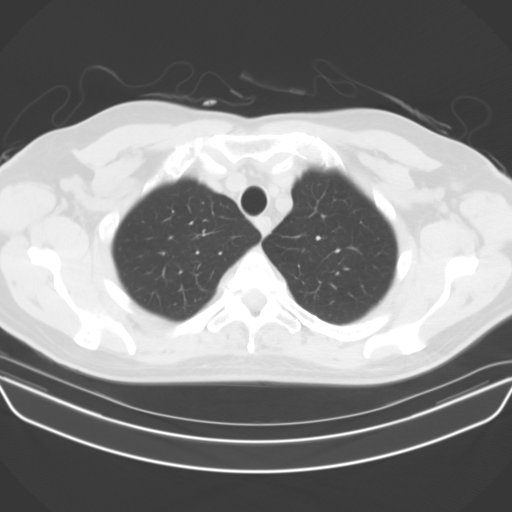
[im 65/77  lung]
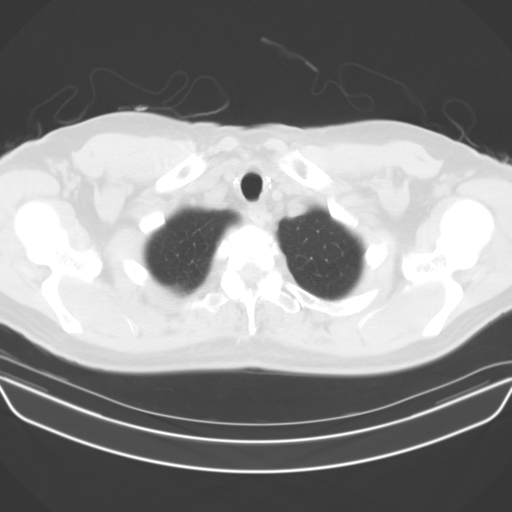
[im 71/77  lung]
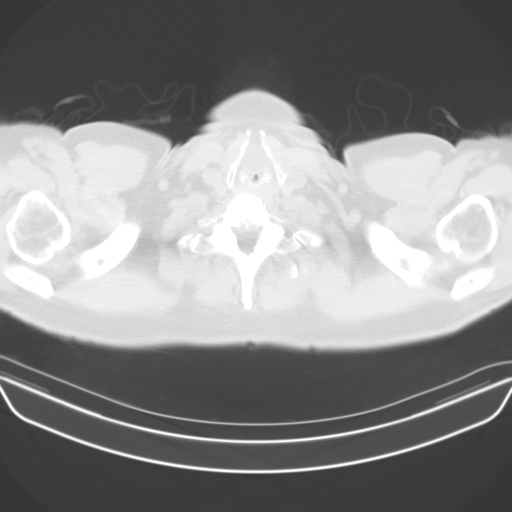

[Series 4: cor · coronal · 0.75mm/px · 3 of 86 slices shown]
[im 18/86  lung]
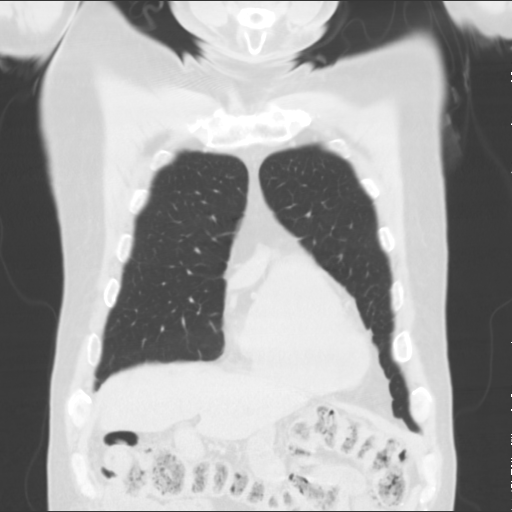
[im 35/86  lung]
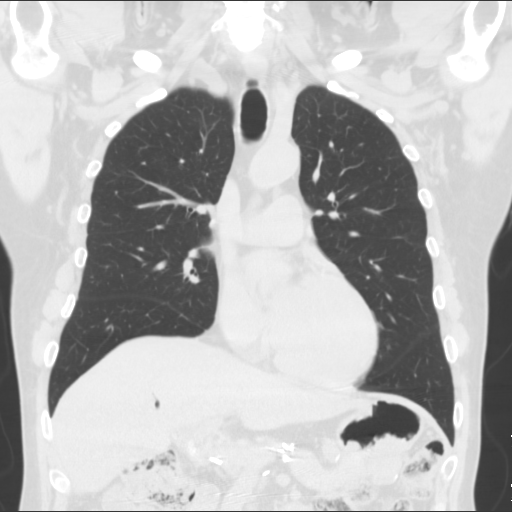
[im 52/86  lung]
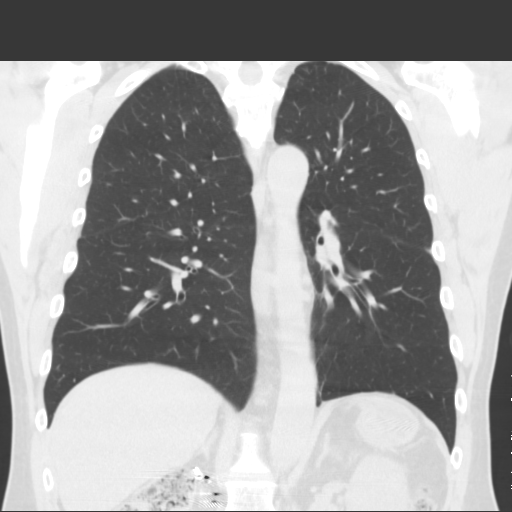

[15 of 36 positions shown; findings below may reference images not displayed]

FINDINGS: The lungs are well aerated bilaterally. No focal infiltrate or
sizable effusion is noted. A dominant 2.0 cm mass lesion is noted
within the right middle lobe near the junction of the major and
minor fissures. Additionally multiple bilateral smaller pulmonary
nodules are seen the largest of these other lesions measures 13 mm
and lies in the posterior costophrenic angle on the left.

The thoracic inlet is within normal limits. The thoracic aorta and
its branches are within normal limits with the exception very mild
atherosclerotic calcifications. No hilar or mediastinal adenopathy
is identified. Mild Coronary calcifications are seen.

Scanning into the upper abdomen demonstrates changes consistent with
prior Whipple procedure as well as a right nephrectomy. No
definitive bony abnormality is seen.
IMPRESSION: Multiple bilateral pulmonary nodules the largest of which lies
within the right middle lobe measuring 2 cm. Given the patient's
clinical history and multiplicity of lesions, these are consistent
with metastatic disease. CT-guided biopsy would be helpful for
further evaluation.

These results will be called to the ordering clinician or
representative by the Radiologist Assistant, and communication
documented in the PACS or zVision Dashboard.

## 2017-06-22 ENCOUNTER — Ambulatory Visit: Payer: Medicare Other | Admitting: Rheumatology

## 2017-06-26 NOTE — Progress Notes (Signed)
Office Visit Note  Patient: Dylan Jefferson             Date of Birth: 15-Aug-1950           MRN: [id number]             PCP: [name] (Inactive) Referring: No ref. provider found Visit Date: 07/09/2017 Occupation: @GUAROCC @    Subjective:  Pain in multiple joints   History of Present Illness: Dylan Jefferson is a 66 y.o. male seronegative inflammatory arthritis, osteoarthritis, and disc disease.  Patient states he continues to have generalized pain and discomfort in all of his joints.  He states he feels the Plaquenil has helped his symptoms considerably but he still has discomfort.  He denies any joint swelling.  He feels he was doing better when he was taking 2 tablets of Plaquenil daily.  He is now only taking 1-1/2 tablets daily due to neutropenia.  He feels the neutropenia was due to increasing his dose of Cosopt. He needs a refill of PLQ today.  He uses Voltaren gel daily on multiple joints.  He continues to have discomfort in his neck and lower back but it has not worsened.  His next PLQ eye exam is at the end of March 2019.     Activities of Daily Living:  Patient reports morning stiffness for 2  hours.   Patient Reports nocturnal pain.  Difficulty dressing/grooming: Denies Difficulty climbing stairs: Denies Difficulty getting out of chair: Denies Difficulty using hands for taps, buttons, cutlery, and/or writing: Denies   Review of Systems  Constitutional: Negative for fatigue, night sweats and weakness.  HENT: Positive for hearing loss and mouth dryness. Negative for mouth sores and nose dryness.   Eyes: Positive for dryness. Negative for redness.  Respiratory: Negative.  Negative for cough, shortness of breath and difficulty breathing.   Cardiovascular: Negative.  Positive for hypertension. Negative for chest pain, palpitations, irregular heartbeat and swelling in legs/feet.  Gastrointestinal: Negative.  Negative for blood in stool, constipation and diarrhea.    Endocrine: Negative.  Negative for increased urination.  Genitourinary: Negative for painful urination, hematuria and urgency.  Musculoskeletal: Positive for arthralgias, joint pain, joint swelling, muscle weakness and morning stiffness. Negative for myalgias, muscle tenderness and myalgias.  Skin: Negative for color change, rash, hair loss, nodules/bumps, skin tightness, ulcers and sensitivity to sunlight.  Allergic/Immunologic: Negative for susceptible to infections.  Neurological: Negative for dizziness, fainting, light-headedness, numbness, memory loss and night sweats.  Hematological: Negative for swollen glands.  Psychiatric/Behavioral: Negative for depressed mood and sleep disturbance. The patient is not nervous/anxious.     PMFS History:  Patient Active Problem List   Diagnosis Date Noted  . Counseling regarding advanced care planning and goals of care 06/11/2017  . Contracture, right elbow 12/12/2016  . 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 History of diabetes mellitus 07/24/2016  . History of esophagitis 07/24/2016  . Thyroid cancer ([place]) 05/28/2016  . Hyperlipidemia 05/28/2016  . Inflammatory polyarthritis ([place]) 05/28/2016  . DDD (degenerative disc disease), cervical 05/28/2016  . DDD (degenerative disc disease), lumbar 05/28/2016  . Osteoarthritis of both knees 05/28/2016  . Osteoarthritis of both hands 05/28/2016  . Peripheral vascular disorder ([place]) 05/28/2016  . 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 Nodule of finger of left hand 10/11/2015  . Lung metastases ([place]) 09/06/2015  . Fatigue 08/16/2015  . Hyperbilirubinemia 08/16/2015  . Renal insufficiency 08/09/2015  . Encounter for chemotherapy management 07/16/2015  . 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 (  [place]) 07/12/2015  . Personal history of renal cell cancer 03/29/2014  . Special screening for malignant neoplasms, colon 03/29/2014  . Esophageal varices with bleeding ([place]) 03/29/2014  . RENAL CELL CANCER,  METASTATIC 12/04/2007  . Hypothyroidism 12/04/2007  . Diabetes mellitus ([place]) 12/04/2007  . HTN (hypertension) 12/04/2007  . ESOPHAGITIS 12/04/2007  . HEMOCCULT POSITIVE STOOL 12/04/2007  . HEADACHE, CHRONIC 12/04/2007    Past Medical History:  Diagnosis Date  . Arthritis   . Cancer [facility])    Renal cell  . DDD (degenerative disc disease), cervical 05/28/2016  . DDD (degenerative disc disease), lumbar 05/28/2016  . Depression   . Diabetes ([place])   . Hyperlipidemia 05/28/2016  . Hypertension   . Hypothyroidism   . 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 Kidney disease   . Nephrolithiasis   . Osteoarthritis of both hands 05/28/2016  . Osteoarthritis of both knees 05/28/2016  . Peripheral vascular disorder ([place]) 05/28/2016  . 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 Rheumatoid arthritis ([place])   . Thyroid cancer ([place]) 05/28/2016    Family History  Problem Relation Age of Onset  . Bleeding Disorder Mother        Unknown what type  . Osteoporosis Mother   . Lymphoma Father        Hodgkin's lymphoma  . Colon cancer Neg Hx    Past Surgical History:  Procedure Laterality Date  . CATARACT EXTRACTION Bilateral 2013  . CHOLECYSTECTOMY    . GASTRECTOMY     tumor removed  . LIPOMA EXCISION Right 1999  . NEPHRECTOMY Right   . PANCREATECTOMY    . SPLENECTOMY    . THYROIDECTOMY    . URETERAL STENT PLACEMENT Left 2012  . WHIPPLE PROCEDURE     Social History   Social History Narrative  . Not on file     Objective: Vital Signs: BP (!) 157/71   Pulse (!) 51   Resp 16   Ht 5\' 10"  (1.778 m)   Wt 198 lb (89.8 kg)   BMI 28.41 kg/m    Physical Exam  Constitutional: He is oriented to person, place, and time. He appears well-developed and well-nourished.  HENT:  Head: Normocephalic and atraumatic.  Eyes: Conjunctivae and EOM are normal. Pupils are equal, round, and reactive to light.  Neck: Normal range of motion. Neck supple.    Cardiovascular: Normal rate, regular rhythm and normal heart sounds.  Pulmonary/Chest: Effort normal and breath sounds normal.  Abdominal: Soft. Bowel sounds are normal.  Musculoskeletal: He exhibits edema (Bilateral pitting edema).  Neurological: He is alert and oriented to person, place, and time.  Skin: Skin is warm and dry. Capillary refill takes less than 2 seconds.  Psychiatric: He has a normal mood and affect. His behavior is normal.  Nursing note and vitals reviewed.    Musculoskeletal Exam: C-spine, thoracic, and lumbar spine good ROM.  Shoulder joints, left elbows, and wrists good ROM. Right elbow flexion contracture. MCPs, PIPs, and DIPs good ROM with no synovitis.  PIP and DIP synovial thickening.  Hip joints, knee joints, and ankle joints good ROM with no synovitis.  Bilateral knee crepitus.  MTPs, PIPs, and DIPs good ROM with no synovitis.  No discomfort with plantarflexion or dorsiflexsion of ankle.  Discomfort with palpation of 1st and 2nd MTPs bilaterally.       CDAI Exam: No CDAI exam completed.    Investigation: No additional findings.PLQ eye exam: 11/07/2016 CBC Latest Ref Rng &  Units 06/11/2017 04/30/2017 03/12/2017  WBC 4.0 - 10.3 10e3/uL 4.2 3.8(L) 4.0  Hemoglobin 13.0 - 17.1 g/dL 14.0 12.7(L) 13.5  Hematocrit 38.4 - 49.9 % 40.7 36.9(L) 39.4  Platelets 140 - 400 10e3/uL 223 212 240   CMP Latest Ref Rng & Units 06/11/2017 04/30/2017 03/12/2017  Glucose 70 - 140 mg/dl 180(H) 255(H) 144(H)  BUN 7.0 - 26.0 mg/dL 18.8 18.5 17.3  Creatinine 0.7 - 1.3 mg/dL 1.7(H) 1.6(H) 1.7(H)  Sodium 136 - 145 mEq/L 141 139 139  Potassium 3.5 - 5.1 mEq/L 4.3 4.2 3.9  Chloride 101 - 111 mmol/L - - -  CO2 22 - 29 mEq/L 20(L) 24 23  Calcium 8.4 - 10.4 mg/dL 8.8 8.6 8.7  Total Protein 6.4 - 8.3 g/dL 6.2(L) 5.7(L) 5.7(L)  Total Bilirubin 0.20 - 1.20 mg/dL 1.43(H) 1.14 0.85  Alkaline Phos 40 - 150 U/L 86 86 111  AST 5 - 34 U/L 30 41(H) 35(H)  ALT 0 - 55 U/L 32 29 35    Imaging: No results  found.  Speciality Comments: No specialty comments available.    Procedures:  No procedures performed Allergies: Patient has no known allergies.   Assessment / Plan:     Visit Diagnoses: Inflammatory polyarthritis ([place]): He has no synovitis on exam. Patient is clinically doing well but he feels like he was doing better when he was taking Plaquenil 200 mg BID.  A refill of his plaquenil was called in for Plaquenil 200 mg BID.    High risk medication use - PLQeye exam: 11/07/2016.  His next eye exam is scheduled for the end of March 2019.  His last CBC did not reveal neutropenia.  His previous neutropenia was likely due to Cosopt.  His WBC was 4.2 on 06/11/18 and neutrophils was 2.1. His labs will continue to be monitored.    Primary osteoarthritis of both hands: Synovial thickening of PIP and DIP joints bilaterally.  No synovitis.  Continues to have discomfort.  He uses Voltaren gel, which helps considerably.  Primary osteoarthritis of both knees: Continues to have discomfort but has no effusion or warmth on exam.  Voltaren gel has been helping him.    DDD (degenerative disc disease), lumbar: Chronic pain and discomfort.      DDD (degenerative disc disease), cervical:  Good ROM on exam.  Pain level has remained stable.  Other medical conditions are listed as follows:   History of hypothyroidism  Renal calcinosis  Personal history of renal cell cancer  History of renal calculi  Renal insufficiency  History of diabetes mellitus  History of esophagitis  Positive QuantiFERON-TB Gold test  History of thyroid cancer  History of peripheral vascular disease  Nodule of finger of left hand  Hyperbilirubinemia  History of malignant neoplasm metastatic to lung  Contracture, right elbow    Orders: No orders of the defined types were placed in this encounter.  Meds ordered this encounter  Medications  . hydroxychloroquine (PLAQUENIL) 200 MG tablet    Sig: take 1 tablet by  mouth every morning and 1 tablet by mouth every evening    Dispense:  180 tablet    Refill:  1    Face-to-face time spent with patient was 30 minutes. Greater than 50% of time was spent in counseling and coordination of care.  Follow-Up Instructions: Return in about 5 months (around 12/07/2017) for Osteoarthritis, Inflammatory polyarthritis.   Note - This record has been created using [employer].  Chart creation errors have been sought, but  may not always  have been located. Such creation errors do not reflect on  the standard of medical care.

## 2017-06-30 ENCOUNTER — Telehealth: Payer: Self-pay | Admitting: Pharmacy Technician

## 2017-06-30 NOTE — Telephone Encounter (Signed)
Oral Oncology Patient Advocate Encounter  Met patient in [place] to complete a renewal application for [employer] in an effort to keep the patient's out of pocket expense for [employer] at $0.    Application completed and faxed to [phone].   [employer] patient assistance phone number for follow up is [phone].   This encounter will be updated until final determination.   Fabio Asa. Melynda Keller, [place] Patient [place] [phone] 06/30/2017 2:43 PM

## 2017-07-07 ENCOUNTER — Other Ambulatory Visit: Payer: Self-pay | Admitting: Rheumatology

## 2017-07-07 NOTE — Telephone Encounter (Signed)
Last Visit: 12/22/16 Next Visit: 07/09/17 Labs: 06/11/17 Stable PLQ Eye Exam: 11/07/16 WNL   Okay to refill per Dr. Estanislado Pandy

## 2017-07-09 ENCOUNTER — Ambulatory Visit: Payer: Medicare Other | Admitting: Rheumatology

## 2017-07-09 ENCOUNTER — Encounter: Payer: Self-pay | Admitting: Rheumatology

## 2017-07-09 VITALS — BP 157/71 | HR 51 | Resp 16 | Ht 70.0 in | Wt 198.0 lb

## 2017-07-09 DIAGNOSIS — Z859 Personal history of malignant neoplasm, unspecified: Secondary | ICD-10-CM

## 2017-07-09 DIAGNOSIS — Z79899 Other long term (current) drug therapy: Secondary | ICD-10-CM

## 2017-07-09 DIAGNOSIS — M064 Inflammatory polyarthropathy: Secondary | ICD-10-CM

## 2017-07-09 DIAGNOSIS — Z8679 Personal history of other diseases of the circulatory system: Secondary | ICD-10-CM

## 2017-07-09 DIAGNOSIS — Z85118 Personal history of other malignant neoplasm of bronchus and lung: Secondary | ICD-10-CM

## 2017-07-09 DIAGNOSIS — M19041 Primary osteoarthritis, right hand: Secondary | ICD-10-CM

## 2017-07-09 DIAGNOSIS — Z87442 Personal history of urinary calculi: Secondary | ICD-10-CM

## 2017-07-09 DIAGNOSIS — Z85528 Personal history of other malignant neoplasm of kidney: Secondary | ICD-10-CM | POA: Diagnosis not present

## 2017-07-09 DIAGNOSIS — M5136 Other intervertebral disc degeneration, lumbar region: Secondary | ICD-10-CM | POA: Diagnosis not present

## 2017-07-09 DIAGNOSIS — M19042 Primary osteoarthritis, left hand: Secondary | ICD-10-CM

## 2017-07-09 DIAGNOSIS — Z8719 Personal history of other diseases of the digestive system: Secondary | ICD-10-CM | POA: Diagnosis not present

## 2017-07-09 DIAGNOSIS — R7612 Nonspecific reaction to cell mediated immunity measurement of gamma interferon antigen response without active tuberculosis: Secondary | ICD-10-CM

## 2017-07-09 DIAGNOSIS — N289 Disorder of kidney and ureter, unspecified: Secondary | ICD-10-CM

## 2017-07-09 DIAGNOSIS — Z8589 Personal history of malignant neoplasm of other organs and systems: Secondary | ICD-10-CM

## 2017-07-09 DIAGNOSIS — Z8639 Personal history of other endocrine, nutritional and metabolic disease: Secondary | ICD-10-CM

## 2017-07-09 DIAGNOSIS — M170 Bilateral primary osteoarthritis of knee: Secondary | ICD-10-CM

## 2017-07-09 DIAGNOSIS — Z85850 Personal history of malignant neoplasm of thyroid: Secondary | ICD-10-CM

## 2017-07-09 DIAGNOSIS — M5030 Other cervical disc degeneration, unspecified cervical region: Secondary | ICD-10-CM

## 2017-07-09 DIAGNOSIS — R2232 Localized swelling, mass and lump, left upper limb: Secondary | ICD-10-CM

## 2017-07-09 DIAGNOSIS — M24521 Contracture, right elbow: Secondary | ICD-10-CM

## 2017-07-09 DIAGNOSIS — N29 Other disorders of kidney and ureter in diseases classified elsewhere: Secondary | ICD-10-CM

## 2017-07-09 MED ORDER — HYDROXYCHLOROQUINE SULFATE 200 MG PO TABS: ORAL_TABLET | ORAL | 1 refills | Status: DC

## 2017-07-24 NOTE — Telephone Encounter (Signed)
Received a call from Dylan Jefferson.  [employer] had informed him that they had not received his application for [id number] re-enrollment.    I contacted [place] and was informed by the representative there, Dylan Jefferson, that they did indeed have his completed application and nothing else was required at this time.   Dylan Jefferson. Dylan Jefferson, Dylan Jefferson [phone] 07/24/2017 10:41 AM

## 2017-08-10 ENCOUNTER — Encounter (HOSPITAL_COMMUNITY)
Admission: RE | Admit: 2017-08-10 | Discharge: 2017-08-10 | Disposition: A | Discharge status: Home / Self Care | Payer: Medicare Other | Source: Ambulatory Visit | Attending: Hematology | Admitting: Hematology

## 2017-08-10 DIAGNOSIS — C7800 Secondary malignant neoplasm of unspecified lung: Secondary | ICD-10-CM | POA: Insufficient documentation

## 2017-08-10 LAB — GLUCOSE, CAPILLARY: GLUCOSE-CAPILLARY: 105 mg/dL — AB (ref 65–99)

## 2017-08-10 MED ORDER — FLUDEOXYGLUCOSE F - 18 (FDG) INJECTION
9.8000 | Freq: Once | INTRAVENOUS | Status: AC | PRN
  Administered 2017-08-10: 9.8 via INTRAVENOUS

## 2017-08-13 ENCOUNTER — Inpatient Hospital Stay: Payer: Medicare Other | Attending: Hematology | Admitting: Hematology

## 2017-08-13 ENCOUNTER — Inpatient Hospital Stay: Payer: Medicare Other

## 2017-08-13 ENCOUNTER — Telehealth: Payer: Self-pay | Admitting: Hematology

## 2017-08-13 VITALS — BP 120/65 | HR 59 | Temp 99.0°F | Resp 18 | Ht 70.0 in | Wt 202.5 lb

## 2017-08-13 DIAGNOSIS — C7900 Secondary malignant neoplasm of unspecified kidney and renal pelvis: Secondary | ICD-10-CM

## 2017-08-13 DIAGNOSIS — C7800 Secondary malignant neoplasm of unspecified lung: Secondary | ICD-10-CM | POA: Diagnosis not present

## 2017-08-13 DIAGNOSIS — E119 Type 2 diabetes mellitus without complications: Secondary | ICD-10-CM | POA: Insufficient documentation

## 2017-08-13 DIAGNOSIS — Z923 Personal history of irradiation: Secondary | ICD-10-CM | POA: Diagnosis not present

## 2017-08-13 DIAGNOSIS — N189 Chronic kidney disease, unspecified: Secondary | ICD-10-CM

## 2017-08-13 DIAGNOSIS — D7589 Other specified diseases of blood and blood-forming organs: Secondary | ICD-10-CM | POA: Insufficient documentation

## 2017-08-13 DIAGNOSIS — D696 Thrombocytopenia, unspecified: Secondary | ICD-10-CM | POA: Diagnosis not present

## 2017-08-13 DIAGNOSIS — Z905 Acquired absence of kidney: Secondary | ICD-10-CM

## 2017-08-13 DIAGNOSIS — M069 Rheumatoid arthritis, unspecified: Secondary | ICD-10-CM | POA: Diagnosis not present

## 2017-08-13 DIAGNOSIS — C649 Malignant neoplasm of unspecified kidney, except renal pelvis: Secondary | ICD-10-CM | POA: Diagnosis not present

## 2017-08-13 LAB — COMPREHENSIVE METABOLIC PANEL
ALBUMIN: 3.7 g/dL (ref 3.5–5.0)
ALT: 31 U/L (ref 0–55)
ANION GAP: 8 (ref 3–11)
AST: 28 U/L (ref 5–34)
Alkaline Phosphatase: 80 U/L (ref 40–150)
BUN: 18 mg/dL (ref 7–26)
CO2: 20 mmol/L — AB (ref 22–29)
Calcium: 8.8 mg/dL (ref 8.4–10.4)
Chloride: 112 mmol/L — ABNORMAL HIGH (ref 98–109)
Creatinine, Ser: 1.71 mg/dL — ABNORMAL HIGH (ref 0.70–1.30)
GFR calc Af Amer: 46 mL/min — ABNORMAL LOW (ref >60)
GFR calc non Af Amer: 40 mL/min — ABNORMAL LOW (ref >60)
Glucose, Bld: 209 mg/dL — ABNORMAL HIGH (ref 70–140)
POTASSIUM: 4 mmol/L (ref 3.5–5.1)
SODIUM: 140 mmol/L (ref 136–145)
Total Bilirubin: 1 mg/dL (ref 0.2–1.2)
Total Protein: 5.8 g/dL — ABNORMAL LOW (ref 6.4–8.3)

## 2017-08-13 LAB — CBC WITH DIFFERENTIAL/PLATELET
Basophils Absolute: 0 10*3/uL (ref 0.0–0.1)
Basophils Relative: 1 %
Eosinophils Absolute: 0.2 10*3/uL (ref 0.0–0.5)
Eosinophils Relative: 4 %
HEMATOCRIT: 40.1 % (ref 38.4–49.9)
Hemoglobin: 13.5 g/dL (ref 13.0–17.1)
LYMPHS ABS: 1.5 10*3/uL (ref 0.9–3.3)
Lymphocytes Relative: 36 %
MCH: 37.8 pg — AB (ref 27.2–33.4)
MCHC: 33.7 g/dL (ref 32.0–36.0)
MCV: 112.3 fL — ABNORMAL HIGH (ref 79.3–98.0)
Monocytes Absolute: 0.6 10*3/uL (ref 0.1–0.9)
Monocytes Relative: 13 %
NEUTROS ABS: 2 10*3/uL (ref 1.5–6.5)
NEUTROS PCT: 46 %
Platelets: 252 10*3/uL (ref 140–400)
RBC: 3.57 MIL/uL — ABNORMAL LOW (ref 4.20–5.82)
RDW: 13.5 % (ref 11.0–15.6)
WBC: 4.2 10*3/uL (ref 4.0–10.3)

## 2017-08-13 LAB — RETICULOCYTES
RBC.: 3.57 MIL/uL — ABNORMAL LOW (ref 4.20–5.82)
Retic Count, Absolute: 110.7 10*3/uL — ABNORMAL HIGH (ref 34.8–93.9)
Retic Ct Pct: 3.1 % — ABNORMAL HIGH (ref 0.8–1.8)

## 2017-08-13 LAB — LACTATE DEHYDROGENASE: LDH: 205 U/L (ref 125–245)

## 2017-08-13 NOTE — Progress Notes (Signed)
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 2) Remote history of metastatic renal cell carcinoma Diagnosed with renal cell carcinoma 20 years ago and had a right nephrectomy. About 8 years after that he was noted to have abdominal recurrence in his pancreas spleen and small intestine and had a Whipple's procedure and significant abdominal surgery and notes that 10 out of 17 lymph nodes were positive. He also had his gallbladder removed. Postoperative course was complicated by an internal hemorrhage as per his report. Patient notes 2-3 years after that he had recurrence in his thyroid that led to a thyroidectomy. [June 2004] later he had partial gastrectomy for local recurrence. [February 2005] when he presented with GI bleeding. Patient notes that he has had no known evidence of recurrence over the last 8 years until his recent CT scan showed lung nodules.  Current Treatment: Sutent 37.5 mg po daily for 2 weeks on and 1 weeks off. S/p 14 cycles   Previous treatment Sutent on cycle 1 - 50 mg by mouth daily for 4 weeks on and 2-weeks off. It was dose adjusted to 37.5 mg by mouth daily for 2 weeks with 1 week of to help mitigate issues with fatigue, mild hyperbilirubinemia, cytopenias. SBRT to dominant RML nodule.  INTERVAL HISTORY:  Mr. Summons is here for for his scheduled follow-up with his wife. He is doing well, overall.  However, this past week he notes the PET scan "messed him up" because of fasting, stating it will take 3-4 days for him to return to baseline. He became dehydrated making it hard to find a vein for the injection. Currently, he is  on his off week of Sutent. He states he has being doing well in general. No acute new focal symptoms.. His counts are still stable. He denies any other symptoms or complaints at this time.  He has has been compliant with his Sutent.  REVIEW OF SYSTEMS:    A 10+ POINT REVIEW OF SYSTEMS WAS OBTAINED including neurology, dermatology, psychiatry, cardiac, respiratory, lymph, extremities, GI, GU, Musculoskeletal, constitutional, breasts, reproductive, HEENT.  All pertinent positives are noted in the HPI.  All others are negative.  Past Medical History:  Diagnosis Date  . Arthritis   . Cancer [facility])    Renal cell  . DDD (degenerative disc disease), cervical 05/28/2016  . DDD (degenerative disc disease), lumbar 05/28/2016  . Depression   . Diabetes ([place])   . Hyperlipidemia 05/28/2016  . Hypertension   . Hypothyroidism   . 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 Kidney disease   . Nephrolithiasis   . Osteoarthritis of both hands 05/28/2016  . Osteoarthritis of both knees 05/28/2016  . Peripheral vascular disorder ([place]) 05/28/2016  . 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 Rheumatoid arthritis ([place])   . Thyroid cancer ([place]) 05/28/2016    . Past Surgical History:  Procedure Laterality Date  . CATARACT EXTRACTION Bilateral 2013  . CHOLECYSTECTOMY    . GASTRECTOMY     tumor removed  . LIPOMA EXCISION Right 1999  . NEPHRECTOMY Right   . PANCREATECTOMY    . SPLENECTOMY    . THYROIDECTOMY    . URETERAL STENT PLACEMENT Left 2012  .  WHIPPLE PROCEDURE      . Social History   Tobacco Use  . Smoking status: Former Smoker    Years: 1.50  . Smokeless tobacco: Never Used  Substance Use Topics  . Alcohol use: Yes    Comment: occasional beer  . Drug use: No    ALLERGIES:  has No Known Allergies.  MEDICATIONS:  Current Outpatient Medications  Medication Sig Dispense Refill  . amLODipine (NORVASC) 10 MG tablet take 1 tablet by mouth  once daily 90 tablet 3  . Ascorbic Acid (VITAMIN C) 1000 MG tablet Take 1,000 mg by mouth daily.    Marland Kitchen aspirin 81 MG tablet Take 81 mg by mouth daily.    Marland Kitchen atenolol (TENORMIN) 100 MG tablet     . augmented betamethasone dipropionate (DIPROLENE-AF) 0.05 % cream   0  . calcium citrate-vitamin D (CITRACAL+D) 315-200 MG-UNIT per tablet Take 2 tablets by mouth daily.     . Cyanocobalamin (VITAMIN B-12) 2500 MCG SUBL Take by mouth daily.     Marland Kitchen docusate sodium (COLACE) 100 MG capsule Take 100 mg by mouth 2 (two) times daily.    . dorzolamide-timolol (COSOPT) 22.3-6.8 MG/ML ophthalmic solution place 1 drop into affected eye twice a day  1  . famotidine (PEPCID) 20 MG tablet Take 20 mg by mouth 2 (two) times daily.    . ferrous sulfate 325 (65 FE) MG tablet Take 325 mg by mouth 2 (two) times daily with a meal.     . Glucosamine-MSM-Hyaluronic Acd (JOINT HEALTH) 750-375-30 MG TABS Take 2 tablets by mouth daily.    . hydroxychloroquine (PLAQUENIL) 200 MG tablet take 1 tablet by mouth every morning and 1 tablet by mouth every evening 180 tablet 1  . insulin lispro (HUMALOG) 100 UNIT/ML injection Inject into the skin 3 (three) times daily before meals.    . Iron-Vitamins (GERITOL COMPLETE) TABS Take 1 tablet by mouth daily.    . Lactobacillus (ACIDOPHILUS) CAPS capsule Take 1 capsule by mouth daily.    Marland Kitchen latanoprost (XALATAN) 0.005 % ophthalmic solution place 1 drop into both eyes every evening  1  . LEVEMIR 100 UNIT/ML injection Inject 7 Units into the skin 2 (two) times daily.   1  . levothyroxine (SYNTHROID, LEVOTHROID) 300 MCG tablet Take 300 mcg by mouth 3 (three) times a week.     . lovastatin (MEVACOR) 40 MG tablet Take 40 mg by mouth at bedtime.     . minoxidil (LONITEN) 10 MG tablet Take 10 mg by mouth 2 (two) times daily.     . Multiple Vitamin (MULTIVITAMIN) tablet Take 1 tablet by mouth daily.    . Omega-3 Fatty Acids (FISH OIL) 1200 MG CAPS Take 1 capsule by mouth daily.    . Pancrelipase,  Lip-Prot-Amyl, (CREON) 6000 UNITS CPEP Take 4 capsules by mouth 3 (three) times daily.    . potassium citrate (UROCIT-K) 10 MEQ (1080 MG) SR tablet Take 10 mEq by mouth 2 (two) times daily.     . SUNItinib (SUTENT) 37.5 MG capsule Take 1 tablet (37.5 mg) by mouth daily for 2 wks followed by 1 wk rest 28 capsule 1  . valsartan-hydrochlorothiazide (DIOVAN-HCT) 160-12.5 MG per tablet     . VOLTAREN 1 % GEL apply 3 grams to LARGE JOINTS three times a day if needed  0   No current facility-administered medications for this visit.     PHYSICAL EXAMINATION: ECOG PERFORMANCE STATUS: 1 - Symptomatic but completely ambulatory  . Vitals:  08/13/17 0843  BP: 120/65  Pulse: (!) 59  Resp: 18  Temp: 99 F (37.2 C)  SpO2: 97%   Filed Weights   08/13/17 0843  Weight: 202 lb 8 oz (91.9 kg)   .Body mass index is 29.06 kg/m.  GENERAL:alert, in no acute distress and comfortable SKIN: skin color, texture, turgor are normal, no rashes or significant lesions EYES: normal, conjunctiva are pink and non-injected, sclera clear OROPHARYNX:no exudate, no erythema and lips, buccal mucosa, and tongue normal  NECK: supple, no JVD, thyroid normal size, non-tender, without nodularity LYMPH:  no palpable lymphadenopathy in the cervical, axillary or inguinal LUNGS: clear to auscultation with normal respiratory effort HEART: regular rate & rhythm,  no murmurs and some grade 1 edema on the right side associated with injury ABDOMEN: abdomen soft, non-tender, normoactive bowel sounds  Musculoskeletal: no cyanosis of digits and no clubbing  PSYCH: alert & oriented x 3 with fluent speech NEURO: no focal motor/sensory deficits  LABORATORY DATA:   I have reviewed the data as listed  . CBC Latest Ref Rng & Units 08/13/2017 06/11/2017 04/30/2017  WBC 4.0 - 10.3 K/uL 4.2 4.2 3.8(L)  Hemoglobin 13.0 - 17.1 g/dL 13.5 14.0 12.7(L)  Hematocrit 38.4 - 49.9 % 40.1 40.7 36.9(L)  Platelets 140 - 400 K/uL 252 223 212    . CBC    Component Value Date/Time   WBC 4.2 08/13/2017 0745   RBC 3.57 (L) 08/13/2017 0745   RBC 3.57 (L) 08/13/2017 0745   HGB 13.5 08/13/2017 0745   HGB 14.0 06/11/2017 0808   HCT 40.1 08/13/2017 0745   HCT 40.7 06/11/2017 0808   PLT 252 08/13/2017 0745   PLT 223 06/11/2017 0808   MCV 112.3 (H) 08/13/2017 0745   MCV 112.1 (H) 06/11/2017 0808   MCH 37.8 (H) 08/13/2017 0745   MCHC 33.7 08/13/2017 0745   RDW 13.5 08/13/2017 0745   RDW 13.9 06/11/2017 0808   LYMPHSABS 1.5 08/13/2017 0745   LYMPHSABS 1.6 06/11/2017 0808   MONOABS 0.6 08/13/2017 0745   MONOABS 0.5 06/11/2017 0808   EOSABS 0.2 08/13/2017 0745   EOSABS 0.1 06/11/2017 0808   BASOSABS 0.0 08/13/2017 0745   BASOSABS 0.0 06/11/2017 0808   . CMP Latest Ref Rng & Units 08/13/2017 06/11/2017 04/30/2017  Glucose 70 - 140 mg/dL 209(H) 180(H) 255(H)  BUN 7 - 26 mg/dL 18 18.8 18.5  Creatinine 0.70 - 1.30 mg/dL 1.71(H) 1.7(H) 1.6(H)  Sodium 136 - 145 mmol/L 140 141 139  Potassium 3.5 - 5.1 mmol/L 4.0 4.3 4.2  Chloride 98 - 109 mmol/L 112(H) - -  CO2 22 - 29 mmol/L 20(L) 20(L) 24  Calcium 8.4 - 10.4 mg/dL 8.8 8.8 8.6  Total Protein 6.4 - 8.3 g/dL 5.8(L) 6.2(L) 5.7(L)  Total Bilirubin 0.2 - 1.2 mg/dL 1.0 1.43(H) 1.14  Alkaline Phos 40 - 150 U/L 80 86 86  AST 5 - 34 U/L 28 30 41(H)  ALT 0 - 55 U/L 31 32 29    RADIOGRAPHIC STUDIES: I have personally reviewed the radiological images as listed and agreed with the findings in the report. Nm Pet Image Restag (ps) Skull Base To Thigh  Result Date: 08/10/2017 CLINICAL DATA:  Subsequent treatment strategy for metastatic renal cell carcinoma diagnosed 20 years prior status post right nephrectomy, with history of numerous recurrences including in the pancreas, spleen, small-bowel, thyroid, stomach and lungs. Ongoing therapy. History of SBRT to a dominant right middle lobe metastasis. EXAM: NUCLEAR MEDICINE PET SKULL BASE TO THIGH  TECHNIQUE: 9.8 mCi F-18 FDG was injected  intravenously. Full-ring PET imaging was performed from the skull base to thigh after the radiotracer. CT data was obtained and used for attenuation correction and anatomic localization. Mediastinal Blood Pool Activity (max SUV): 3.5 FASTING BLOOD GLUCOSE:  Value: 105 mg/dl COMPARISON:  03/13/2017 PET-CT. FINDINGS: NECK: No hypermetabolic lymph nodes in the neck. Total thyroidectomy. CHEST: No hypermetabolic axillary, mediastinal or hilar lymph nodes. Right upper lobe 3 mm solid pulmonary nodule (series 8/image 30) and basilar left lower lobe 4 mm solid pulmonary nodule (series 8/image 58) are both stable and below PET resolution. No acute consolidative airspace disease, lung masses or new significant pulmonary nodules. No pneumothorax. No pleural effusions. Stable sharply marginated right mid lung consolidation with associated mild volume loss and distortion, compatible with radiation fibrosis. Coronary atherosclerosis. ABDOMEN/PELVIS: Stable postsurgical changes from cholecystectomy and biliary enteric anastomosis with pneumobilia in the intrahepatic bile ducts. Splenectomy. Partial pancreatectomy. Partial gastrectomy. Right nephrectomy. No evidence of hypermetabolic mass in the surgical beds. No abnormal hypermetabolic activity within the liver or adrenal glands. No hypermetabolic lymph nodes in the abdomen or pelvis. Moderate colonic stool volume. Atherosclerotic nonaneurysmal abdominal aorta. Mildly enlarged prostate, unchanged. SKELETON: No new focal hypermetabolic activity to suggest skeletal metastasis. Low level metabolism (max SUV 3.1) at the site of incompletely healed lateral right fourth rib fracture without associated discrete osseous lesion on the CT images, previous max SUV 3.6, decreased. No significant residual metabolism at the site of the healed anterior left fifth rib fracture, without discrete osseous lesion on the CT images. IMPRESSION: 1. No evidence of recurrent hypermetabolic metastatic  disease. 2. Two tiny solid pulmonary nodules, both stable and below PET resolution. 3. Low level metabolism at the sites of the incompletely healed lateral right fourth rib and healed anterior left fifth rib fractures, with decreased metabolism at both sites, compatible with resolving posttraumatic activity. 4. Chronic findings include: Aortic Atherosclerosis (ICD10-I70.0). Coronary atherosclerosis. Moderate colonic stool volume, suggesting constipation. Stable mild prostatomegaly. Electronically Signed   By: Ilona Sorrel M.D.   On: 08/10/2017 10:47     ASSESSMENT & PLAN:   67 year old Caucasian male with  #1 Recurrent metastatic Renal cell carcinoma with multiple lung metastases. Patient is status post Sutent 14 cycles. Has had SBRT to the dominant right lung nodule with partial response. PET/CT scan from 12/27/2015 shows no evidence of disease progression. PET/CT 03/20/2016 - shows some radiation pneumonitis no new pulmonary nodules. Subtle uptake in the ribs which could be from trauma/bone change due to chronic kidney disease. Though bone metastases cannot be ruled out findings are not definitive enough to warrant change in treatment at this time. No other evidence of metastatic RCC progression at this time.  PET/CT 08/08/2016 with a couple of mildly enlarge LLL pulmonary nodules - no other overt evidence of RCC progression at this time.  PET CT scan 11/06/2016 - no overt evidence of RCC progression at this time .  PET/CT 03/13/2017 - no overt evidence of RCC progression at this time .  PET/CT 08/10/2017 - no overt evidence of RCC progression at this time .  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 Confirmed by [place].  #4 mild thrombocytopenia likely related to Sutent- resolved with dose reduction. #5 elevation in bilirubin levels likely due to Sutent. Bilirubin normalized #6 grade 1 fatigue due to Sutent -better with dose reduction and changing  schedule to 2 week on and 1 week. #7 Dysguesia from Sutent -  manageable per patient. He is trying to eat as well as he can and is maintaining his body weight.  #8 RBC macrocytosis due to Sutent 9 chronic kidney disease/single kidney - creatinine stable @ 1.7  Recommended to avoid NSAIDS and other nephrotoxic medications. Optimize control of DM2 #10 rheumatoid arthritis -follows with Dr. Estanislado Pandy for mx Plan -Patient has no lab or clinical evidence of disease progression at this time.  -Continue current dose of Sutent with close monitoring of labs. No prohibitive toxicities at this time.  -Continue follow-up with primary care physician and rheumatology. -PET/ CT done 08/10/2017, shows no evidence of  Progression of his metastatic RCC -Counts have continued to remain stable and he appears well in clinic.   RTC with Dr Irene Limbo in 6 weeks with labs  I spent 20 minutes counseling the patient face to face. The total time spent in the appointment was 25 minutes and more than 50% was on counseling and direct patient cares.  Sullivan Lone MD [place] AAHIVMS [facility] Hematology/Oncology Physician [place]  (Office):       [phone] (Work cell):  [phone] (Fax):           [phone]  This document serves as a record of services personally performed by Sullivan Lone, MD. It was created on his behalf by Margit Banda, a trained medical scribe. The creation of this record is based on the scribe's personal observations and the provider's statements to them.   .I have reviewed the above documentation for accuracy and completeness, and I agree with the above. Brunetta Genera MD MS

## 2017-08-13 NOTE — Telephone Encounter (Signed)
Gave avs and calendar for February  °

## 2017-08-18 ENCOUNTER — Telehealth: Payer: Self-pay | Admitting: Pharmacy Technician

## 2017-08-18 NOTE — Telephone Encounter (Signed)
Oral Oncology Patient Advocate Encounter  Was successful in securing patient a $10,000 grant from [facility] to provide copayment coverage for Sutent. This will keep the out of pocket expense at $0.   His prescription will be filled at [facility] going forward.  I have spoken with the patient. He has enough medication to last until early March.  I have planned to speak with him again at the end of February to make arrangements for filling his prescription.  Dylan Jefferson voiced understanding of the plan and will reach out if he needs any assistance in the meantime.    The billing information is as follows and has been shared with [place].   Member ID: [id number] Group ID: [id number] RxBin: [id number] Dates of Eligibility: 07/15/2017 through 07/14/2018  Fabio Asa. Melynda Keller, [place] Patient [place] [phone] 08/18/2017 1:43 PM

## 2017-08-18 NOTE — Telephone Encounter (Signed)
Received communication from [employer] that since copay assistance grants are available, enrollment will not be granted at this time.  (Patient has been awarded a Radio producer with [employer])  This application has been suspended with [employer] and filed away in the event that it is needed later in the year.   Fabio Asa. Melynda Keller, [place] Patient [place] [phone] 08/18/2017 1:36 PM

## 2017-09-15 ENCOUNTER — Other Ambulatory Visit: Payer: Self-pay | Admitting: Pharmacist

## 2017-09-15 DIAGNOSIS — C649 Malignant neoplasm of unspecified kidney, except renal pelvis: Secondary | ICD-10-CM

## 2017-09-15 MED ORDER — SUNITINIB MALATE 37.5 MG PO CAPS: ORAL_CAPSULE | ORAL | 1 refills | Status: DC

## 2017-09-22 MED FILL — SUTENT 37.5 MG CAPSULE: 37.5 | 42 days supply | Qty: 28 | Fill #0

## 2017-09-23 NOTE — Progress Notes (Signed)
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 2) Remote history of metastatic renal cell carcinoma Diagnosed with renal cell carcinoma 20 years ago and had a right nephrectomy. About 8 years after that he was noted to have abdominal recurrence in his pancreas spleen and small intestine and had a Whipple's procedure and significant abdominal surgery and notes that 10 out of 17 lymph nodes were positive. He also had his gallbladder removed. Postoperative course was complicated by an internal hemorrhage as per his report. Patient notes 2-3 years after that he had recurrence in his thyroid that led to a thyroidectomy. [June 2004] later he had partial gastrectomy for local recurrence. [February 2005] when he presented with GI bleeding. Patient notes that he has had no known evidence of recurrence over the last 8 years until his recent CT scan showed lung nodules.  Current Treatment: Sutent 37.5 mg po daily for 2 weeks on and 1 weeks off.  Previous treatment Sutent on cycle 1 - 50 mg by mouth daily for 4 weeks on and 2-weeks off. It was dose adjusted to 37.5 mg by mouth daily for 2 weeks with 1 week of to help mitigate issues with fatigue, mild hyperbilirubinemia, cytopenias. SBRT to dominant RML nodule.  INTERVAL HISTORY:  Mr. Statz is here for for his scheduled follow-up of his metastatic renal cell carcinoma. The patient's last visit with [place] was on 08/13/17. The pt reports that he is doing well overall and is looking forward to the warm weather.  He reports lots of joy in taking care of the animals that live around his home. He reports that he is  following up with his PCP Dr. Philip Aspen on his elevated blood sugars. He reports no problems taking his Sutent. He reports that he will be getting his refill of Sutent soon.   Lab results today (09/24/17) of CBC, CMP, and Reticulocytes is as follows: all values are WNL except for WBC at 3.8k, RBC at 3.57, MCV at 112.9, MCH at 37.8, Chloride at 110, CO2 at 20, Glucose at 327, Creatinine at 1.59, Calcium at 8.3, Total Protein at 5.7, Total Bilirubin at 1.4, Retic Ct Pct at 3.0%, Retic count abs at 107.1. LDH is WNL 09/24/17 at 198.   On review of systems, pt reports lack of taste, and denies mouth sores, abdominal pains, leg swelling, and any other symptoms.  No chest pain no shortness of breath. Overall in very good spirits. Rheumatoid arthritis in good control.  REVIEW OF SYSTEMS:    .10 Point review of Systems was done is negative except as noted above.   Past Medical History:  Diagnosis Date  . Arthritis   . Cancer [facility])    Renal cell  . DDD (degenerative disc disease), cervical 05/28/2016  . DDD (degenerative disc disease), lumbar 05/28/2016  . Depression   . Diabetes ([place])   . Hyperlipidemia 05/28/2016  . Hypertension   . Hypothyroidism   . 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 Kidney disease   . Nephrolithiasis   . Osteoarthritis of both hands 05/28/2016  . Osteoarthritis of both knees 05/28/2016  . Peripheral vascular disorder ([place]) 05/28/2016  . 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  Rheumatoid arthritis ([place])   . Thyroid cancer ([place]) 05/28/2016    . Past Surgical History:  Procedure Laterality Date  . CATARACT EXTRACTION Bilateral 2013  . CHOLECYSTECTOMY    . GASTRECTOMY     tumor removed  . LIPOMA EXCISION Right 1999  . NEPHRECTOMY Right   . PANCREATECTOMY    . SPLENECTOMY    . THYROIDECTOMY    . URETERAL STENT PLACEMENT Left 2012  . WHIPPLE PROCEDURE      . Social History   Tobacco Use  . Smoking  status: Former Smoker    Years: 1.50  . Smokeless tobacco: Never Used  Substance Use Topics  . Alcohol use: Yes    Comment: occasional beer  . Drug use: No    ALLERGIES:  has No Known Allergies.  MEDICATIONS:  Current Outpatient Medications  Medication Sig Dispense Refill  . amLODipine (NORVASC) 10 MG tablet take 1 tablet by mouth once daily 90 tablet 3  . Ascorbic Acid (VITAMIN C) 1000 MG tablet Take 1,000 mg by mouth daily.    Marland Kitchen aspirin 81 MG tablet Take 81 mg by mouth daily.    Marland Kitchen atenolol (TENORMIN) 100 MG tablet     . augmented betamethasone dipropionate (DIPROLENE-AF) 0.05 % cream   0  . calcium citrate-vitamin D (CITRACAL+D) 315-200 MG-UNIT per tablet Take 2 tablets by mouth daily.     . Cyanocobalamin (VITAMIN B-12) 2500 MCG SUBL Take by mouth daily.     Marland Kitchen docusate sodium (COLACE) 100 MG capsule Take 100 mg by mouth 2 (two) times daily.    . dorzolamide-timolol (COSOPT) 22.3-6.8 MG/ML ophthalmic solution place 1 drop into affected eye twice a day  1  . famotidine (PEPCID) 20 MG tablet Take 20 mg by mouth 2 (two) times daily.    . ferrous sulfate 325 (65 FE) MG tablet Take 325 mg by mouth 2 (two) times daily with a meal.     . Glucosamine-MSM-Hyaluronic Acd (JOINT HEALTH) 750-375-30 MG TABS Take 2 tablets by mouth daily.    . hydroxychloroquine (PLAQUENIL) 200 MG tablet take 1 tablet by mouth every morning and 1 tablet by mouth every evening 180 tablet 1  . insulin lispro (HUMALOG) 100 UNIT/ML injection Inject into the skin 3 (three) times daily before meals.    . Iron-Vitamins (GERITOL COMPLETE) TABS Take 1 tablet by mouth daily.    . Lactobacillus (ACIDOPHILUS) CAPS capsule Take 1 capsule by mouth daily.    Marland Kitchen latanoprost (XALATAN) 0.005 % ophthalmic solution place 1 drop into both eyes every evening  1  . LEVEMIR 100 UNIT/ML injection Inject 7 Units into the skin 2 (two) times daily.   1  . levothyroxine (SYNTHROID, LEVOTHROID) 300 MCG tablet Take 300 mcg by mouth 3 (three)  times a week.     . lovastatin (MEVACOR) 40 MG tablet Take 40 mg by mouth at bedtime.     . minoxidil (LONITEN) 10 MG tablet Take 10 mg by mouth 2 (two) times daily.     . Multiple Vitamin (MULTIVITAMIN) tablet Take 1 tablet by mouth daily.    . Omega-3 Fatty Acids (FISH OIL) 1200 MG CAPS Take 1 capsule by mouth daily.    . Pancrelipase, Lip-Prot-Amyl, (CREON) 6000 UNITS CPEP Take 4 capsules by mouth 3 (three) times daily.    . potassium citrate (UROCIT-K) 10 MEQ (1080 MG) SR tablet Take 10 mEq by mouth 2 (two) times daily.     . SUNItinib (SUTENT) 37.5 MG capsule Take 1 capsule (  37.5 mg) by mouth once daily for 2 weeks on, 1 week off, repeat every 42 days 28 capsule 1  . valsartan-hydrochlorothiazide (DIOVAN-HCT) 160-12.5 MG per tablet     . VOLTAREN 1 % GEL apply 3 grams to LARGE JOINTS three times a day if needed  0   No current facility-administered medications for this visit.     PHYSICAL EXAMINATION: ECOG PERFORMANCE STATUS: 1 - Symptomatic but completely ambulatory  . Vitals:   09/24/17 0904  BP: 124/63  Pulse: (!) 52  Resp: 18  Temp: 98.4 F (36.9 C)  SpO2: 97%   Filed Weights   09/24/17 0904  Weight: 200 lb 8 oz (90.9 kg)   .Body mass index is 28.77 kg/m.  Marland Kitchen GENERAL:alert, in no acute distress and comfortable SKIN: no acute rashes, no significant lesions EYES: conjunctiva are pink and non-injected, sclera anicteric OROPHARYNX: MMM, no exudates, no oropharyngeal erythema or ulceration NECK: supple, no JVD LYMPH:  no palpable lymphadenopathy in the cervical, axillary or inguinal regions LUNGS: clear to auscultation b/l with normal respiratory effort HEART: regular rate & rhythm ABDOMEN:  normoactive bowel sounds , non tender, not distended.old healed surgical incisions Extremity: trace pedal edema PSYCH: alert & oriented x 3 with fluent speech NEURO: no focal motor/sensory deficits    LABORATORY DATA:   I have reviewed the data as listed  . CBC Latest Ref  Rng & Units 09/24/2017 08/13/2017 06/11/2017  WBC 4.0 - 10.3 K/uL 3.8(L) 4.2 4.2  Hemoglobin 13.0 - 17.1 g/dL - 13.5 14.0  Hematocrit 38.4 - 49.9 % 40.3 40.1 40.7  Platelets 140 - 400 K/uL 188 252 223   . CBC    Component Value Date/Time   WBC 3.8 (L) 09/24/2017 0802   WBC 4.2 08/13/2017 0745   RBC 3.57 (L) 09/24/2017 0802   RBC 3.57 (L) 09/24/2017 0802   HGB 13.5 08/13/2017 0745   HGB 14.0 06/11/2017 0808   HCT 40.3 09/24/2017 0802   HCT 40.7 06/11/2017 0808   PLT 188 09/24/2017 0802   PLT 223 06/11/2017 0808   MCV 112.9 (H) 09/24/2017 0802   MCV 112.1 (H) 06/11/2017 0808   MCH 37.8 (H) 09/24/2017 0802   MCHC 33.5 09/24/2017 0802   RDW 13.7 09/24/2017 0802   RDW 13.9 06/11/2017 0808   LYMPHSABS 1.5 09/24/2017 0802   LYMPHSABS 1.6 06/11/2017 0808   MONOABS 0.4 09/24/2017 0802   MONOABS 0.5 06/11/2017 0808   EOSABS 0.1 09/24/2017 0802   EOSABS 0.1 06/11/2017 0808   BASOSABS 0.1 09/24/2017 0802   BASOSABS 0.0 06/11/2017 0808   . CMP Latest Ref Rng & Units 09/24/2017 08/13/2017 06/11/2017  Glucose 70 - 140 mg/dL 327(H) 209(H) 180(H)  BUN 7 - 26 mg/dL 15 18 18.8  Creatinine 0.70 - 1.30 mg/dL 1.59(H) 1.71(H) 1.7(H)  Sodium 136 - 145 mmol/L 140 140 141  Potassium 3.5 - 5.1 mmol/L 4.2 4.0 4.3  Chloride 98 - 109 mmol/L 110(H) 112(H) -  CO2 22 - 29 mmol/L 20(L) 20(L) 20(L)  Calcium 8.4 - 10.4 mg/dL 8.3(L) 8.8 8.8  Total Protein 6.4 - 8.3 g/dL 5.7(L) 5.8(L) 6.2(L)  Total Bilirubin 0.2 - 1.2 mg/dL 1.4(H) 1.0 1.43(H)  Alkaline Phos 40 - 150 U/L 86 80 86  AST 5 - 34 U/L 33 28 30  ALT 0 - 55 U/L 45 31 32    RADIOGRAPHIC STUDIES: I have personally reviewed the radiological images as listed and agreed with the findings in the report. No results found.  ASSESSMENT & PLAN:   67 year old Caucasian male with  #1 Recurrent metastatic Renal cell carcinoma with multiple lung metastases.  Has been on stable on 1st line Sutent for >14yr. Has had SBRT to the dominant right lung nodule  with partial response. No lab or clinical evidence of metastatic RCC progression at this time.  PET/CT 08/08/2016 with a couple of mildly enlarge LLL pulmonary nodules - no other overt evidence of RCC progression at this time.  PET CT scan 11/06/2016 - no overt evidence of RCC progression at this time .  PET/CT 03/13/2017 - no overt evidence of RCC progression at this time .  PET/CT 08/10/2017 - no overt evidence of RCC progression at this time .  PET/ CT done 08/10/2017, shows no evidence of  Progression at this time.  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 Confirmed by [place].  #4 mild thrombocytopenia likely related to Sutent- resolved with dose reduction. #5 elevation in bilirubin levels likely due to Sutent. Bilirubin stable #6 grade 1 fatigue due to Sutent -better with dose reduction and changing schedule to 2 week on and 1 week. #7 Dysguesia from Sutent - manageable per patient. He is trying to eat as well as he can and is maintaining his body weight.  #8 RBC macrocytosis due to Sutent #9 chronic kidney disease/single kidney - creatinine stable @ 1.5-1.9  Recommended to avoid NSAIDS and other nephrotoxic medications. Optimize control of DM2 #10 rheumatoid arthritis -follows with Dr. Estanislado Pandy for mx Plan -Patient has no lab or clinical evidence of disease progression at this time.  -Continue current dose of Sutent with close monitoring of labs. No prohibitive toxicities at this time.  -Continue follow-up with primary care physician and rheumatology. -Discussed pt labwork today; kidney function stable, blood counts stable, blood sugars are high. -Repeat PET scan in 12 weeks  #11 Uncontrolled DM2 BS>300's today -continue f/u with Dr Philip Aspen for continue mx   RTC with Dr Irene Limbo in 6 weeks with labs  . The total time spent in the appointment was 25 minutes and more than 50% was on counseling and direct patient cares.    Sullivan Lone MD  [place] AAHIVMS [facility] Hematology/Oncology Physician [place]  (Office):       [phone] (Work cell):  [phone] (Fax):           [phone]  This document serves as a record of services personally performed by Sullivan Lone, MD. It was created on his behalf by Baldwin [place], a trained medical scribe. The creation of this record is based on the scribe's personal observations and the provider's statements to them.   .I have reviewed the above documentation for accuracy and completeness, and I agree with the above. Brunetta Genera MD MS

## 2017-09-24 ENCOUNTER — Telehealth: Payer: Self-pay | Admitting: Hematology

## 2017-09-24 ENCOUNTER — Encounter: Payer: Self-pay | Admitting: Hematology

## 2017-09-24 ENCOUNTER — Inpatient Hospital Stay: Payer: Medicare Other | Attending: Hematology | Admitting: Hematology

## 2017-09-24 ENCOUNTER — Other Ambulatory Visit: Payer: Self-pay | Admitting: Pharmacist

## 2017-09-24 ENCOUNTER — Inpatient Hospital Stay: Payer: Medicare Other

## 2017-09-24 VITALS — BP 124/63 | HR 52 | Temp 98.4°F | Resp 18 | Ht 70.0 in | Wt 200.5 lb

## 2017-09-24 DIAGNOSIS — C7889 Secondary malignant neoplasm of other digestive organs: Secondary | ICD-10-CM

## 2017-09-24 DIAGNOSIS — Z85850 Personal history of malignant neoplasm of thyroid: Secondary | ICD-10-CM | POA: Diagnosis not present

## 2017-09-24 DIAGNOSIS — D696 Thrombocytopenia, unspecified: Secondary | ICD-10-CM | POA: Insufficient documentation

## 2017-09-24 DIAGNOSIS — M5136 Other intervertebral disc degeneration, lumbar region: Secondary | ICD-10-CM | POA: Insufficient documentation

## 2017-09-24 DIAGNOSIS — Z794 Long term (current) use of insulin: Secondary | ICD-10-CM | POA: Diagnosis not present

## 2017-09-24 DIAGNOSIS — M064 Inflammatory polyarthropathy: Secondary | ICD-10-CM | POA: Insufficient documentation

## 2017-09-24 DIAGNOSIS — Z7982 Long term (current) use of aspirin: Secondary | ICD-10-CM | POA: Insufficient documentation

## 2017-09-24 DIAGNOSIS — E1165 Type 2 diabetes mellitus with hyperglycemia: Secondary | ICD-10-CM | POA: Insufficient documentation

## 2017-09-24 DIAGNOSIS — C649 Malignant neoplasm of unspecified kidney, except renal pelvis: Secondary | ICD-10-CM

## 2017-09-24 DIAGNOSIS — E785 Hyperlipidemia, unspecified: Secondary | ICD-10-CM | POA: Diagnosis not present

## 2017-09-24 DIAGNOSIS — I10 Essential (primary) hypertension: Secondary | ICD-10-CM | POA: Insufficient documentation

## 2017-09-24 DIAGNOSIS — Z87891 Personal history of nicotine dependence: Secondary | ICD-10-CM | POA: Diagnosis not present

## 2017-09-24 DIAGNOSIS — C7900 Secondary malignant neoplasm of unspecified kidney and renal pelvis: Secondary | ICD-10-CM

## 2017-09-24 DIAGNOSIS — C7800 Secondary malignant neoplasm of unspecified lung: Secondary | ICD-10-CM | POA: Insufficient documentation

## 2017-09-24 DIAGNOSIS — Z79899 Other long term (current) drug therapy: Secondary | ICD-10-CM | POA: Insufficient documentation

## 2017-09-24 DIAGNOSIS — R5383 Other fatigue: Secondary | ICD-10-CM | POA: Diagnosis not present

## 2017-09-24 DIAGNOSIS — E039 Hypothyroidism, unspecified: Secondary | ICD-10-CM | POA: Insufficient documentation

## 2017-09-24 DIAGNOSIS — Z85528 Personal history of other malignant neoplasm of kidney: Secondary | ICD-10-CM | POA: Insufficient documentation

## 2017-09-24 DIAGNOSIS — E1151 Type 2 diabetes mellitus with diabetic peripheral angiopathy without gangrene: Secondary | ICD-10-CM | POA: Insufficient documentation

## 2017-09-24 DIAGNOSIS — D7589 Other specified diseases of blood and blood-forming organs: Secondary | ICD-10-CM | POA: Insufficient documentation

## 2017-09-24 LAB — CBC WITH DIFFERENTIAL (CANCER CENTER ONLY)
Basophils Absolute: 0.1 10*3/uL (ref 0.0–0.1)
Basophils Relative: 1 %
EOS ABS: 0.1 10*3/uL (ref 0.0–0.5)
Eosinophils Relative: 2 %
HCT: 40.3 % (ref 38.4–49.9)
HEMOGLOBIN: 13.5 g/dL (ref 13.0–17.1)
Lymphocytes Relative: 40 %
Lymphs Abs: 1.5 10*3/uL (ref 0.9–3.3)
MCH: 37.8 pg — AB (ref 27.2–33.4)
MCHC: 33.5 g/dL (ref 32.0–36.0)
MCV: 112.9 fL — ABNORMAL HIGH (ref 79.3–98.0)
MONO ABS: 0.4 10*3/uL (ref 0.1–0.9)
MONOS PCT: 11 %
NEUTROS PCT: 46 %
Neutro Abs: 1.7 10*3/uL (ref 1.5–6.5)
Platelet Count: 188 10*3/uL (ref 140–400)
RBC: 3.57 MIL/uL — ABNORMAL LOW (ref 4.20–5.82)
RDW: 13.7 % (ref 11.0–14.6)
WBC Count: 3.8 10*3/uL — ABNORMAL LOW (ref 4.0–10.3)

## 2017-09-24 LAB — LACTATE DEHYDROGENASE: LDH: 198 U/L (ref 125–245)

## 2017-09-24 LAB — CMP (CANCER CENTER ONLY)
ALK PHOS: 86 U/L (ref 40–150)
ALT: 45 U/L (ref 0–55)
AST: 33 U/L (ref 5–34)
Albumin: 3.6 g/dL (ref 3.5–5.0)
Anion gap: 10 (ref 3–11)
BILIRUBIN TOTAL: 1.4 mg/dL — AB (ref 0.2–1.2)
BUN: 15 mg/dL (ref 7–26)
CALCIUM: 8.3 mg/dL — AB (ref 8.4–10.4)
CO2: 20 mmol/L — AB (ref 22–29)
CREATININE: 1.59 mg/dL — AB (ref 0.70–1.30)
Chloride: 110 mmol/L — ABNORMAL HIGH (ref 98–109)
GFR, Est AFR Am: 50 mL/min — ABNORMAL LOW (ref >60)
GFR, Estimated: 43 mL/min — ABNORMAL LOW (ref >60)
GLUCOSE: 327 mg/dL — AB (ref 70–140)
Potassium: 4.2 mmol/L (ref 3.5–5.1)
SODIUM: 140 mmol/L (ref 136–145)
Total Protein: 5.7 g/dL — ABNORMAL LOW (ref 6.4–8.3)

## 2017-09-24 LAB — RETICULOCYTES
RBC.: 3.57 MIL/uL — ABNORMAL LOW (ref 4.20–5.82)
RETIC CT PCT: 3 % — AB (ref 0.8–1.8)
Retic Count, Absolute: 107.1 10*3/uL — ABNORMAL HIGH (ref 34.8–93.9)

## 2017-09-24 NOTE — Telephone Encounter (Signed)
Scheduled appt per 2/21 los - Gave patient AVS and calender per los.  

## 2017-11-04 NOTE — Progress Notes (Signed)
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 2) Remote history of metastatic renal cell carcinoma Diagnosed with renal cell carcinoma 20 years ago and had a right nephrectomy. About 8 years after that he was noted to have abdominal recurrence in his pancreas spleen and small intestine and had a Whipple's procedure and significant abdominal surgery and notes that 10 out of 17 lymph nodes were positive. He also had his gallbladder removed. Postoperative course was complicated by an internal hemorrhage as per his report. Patient notes 2-3 years after that he had recurrence in his thyroid that led to a thyroidectomy. [June 2004] later he had partial gastrectomy for local recurrence. [February 2005] when he presented with GI bleeding. Patient notes that he has had no known evidence of recurrence over the last 8 years until his recent CT scan showed lung nodules.  Current Treatment: Sutent 37.5 mg po daily for 2 weeks on and 1 weeks off.  Previous treatment Sutent on cycle 1 - 50 mg by mouth daily for 4 weeks on and 2-weeks off. It was dose adjusted to 37.5 mg by mouth daily for 2 weeks with 1 week of to help mitigate issues with fatigue, mild hyperbilirubinemia, cytopenias. SBRT to dominant RML nodule.  INTERVAL HISTORY:  Dylan Jefferson is here for for his scheduled follow-up of his metastatic renal cell carcinoma. The patient's last visit with [place] was on 09/24/17. The pt reports that he is doing well overall and has been especially enjoying playing chess recently and his trip to [place] to visit family as well.   The pt reports that he is eating very well  and his only complaint in some mild seasonal allergies. He notes no problems taking his Sutent and notes stable RA symptoms.   Lab results today (11/05/17) of CBC, CMP, and Reticulocytes is as follows: all values are WNL except for RBC at 3.59, MCV at 112.0, MCH at 37.9, Retic Ct Pct at 3.1%, Retic Ct Abs at 111.3, Glucose at 251, Creatinine at 1.96, Total Protein at 6.1, Total Bilirubin at 1.4.  On review of systems, pt reports some phlegm, and denies mouth sores, sore throat, abdominal pains, leg swelling, and any other symptoms.    REVIEW OF SYSTEMS:   .10 Point review of Systems was done is negative except as noted above.    Past Medical History:  Diagnosis Date  . Arthritis   . Cancer [facility])    Renal cell  . DDD (degenerative disc disease), cervical 05/28/2016  . DDD (degenerative disc disease), lumbar 05/28/2016  . Depression   . Diabetes ([place])   . Hyperlipidemia 05/28/2016  . Hypertension   . Hypothyroidism   . 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 Kidney disease   . Nephrolithiasis   . Osteoarthritis of both hands 05/28/2016  . Osteoarthritis of both knees 05/28/2016  . Peripheral vascular disorder ([place]) 05/28/2016  . 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 Rheumatoid arthritis ([place])   . Thyroid cancer ([place]) 05/28/2016    . Past Surgical History:  Procedure Laterality Date  . CATARACT EXTRACTION Bilateral 2013  . CHOLECYSTECTOMY    . GASTRECTOMY     tumor removed  .  LIPOMA EXCISION Right 1999  . NEPHRECTOMY Right   . PANCREATECTOMY    . SPLENECTOMY    . THYROIDECTOMY    . URETERAL STENT PLACEMENT Left 2012  . WHIPPLE PROCEDURE      . Social History   Tobacco Use  . Smoking status: Former Smoker    Years: 1.50  . Smokeless tobacco: Never Used  Substance Use Topics  . Alcohol use: Yes    Comment: occasional beer  . Drug use: No    ALLERGIES:  has No Known Allergies.  MEDICATIONS:  Current Outpatient  Medications  Medication Sig Dispense Refill  . amLODipine (NORVASC) 10 MG tablet take 1 tablet by mouth once daily 90 tablet 3  . Ascorbic Acid (VITAMIN C) 1000 MG tablet Take 1,000 mg by mouth daily.    Marland Kitchen aspirin 81 MG tablet Take 81 mg by mouth daily.    Marland Kitchen atenolol (TENORMIN) 100 MG tablet     . augmented betamethasone dipropionate (DIPROLENE-AF) 0.05 % cream   0  . calcium citrate-vitamin D (CITRACAL+D) 315-200 MG-UNIT per tablet Take 2 tablets by mouth daily.     . Cyanocobalamin (VITAMIN B-12) 2500 MCG SUBL Take by mouth daily.     Marland Kitchen docusate sodium (COLACE) 100 MG capsule Take 100 mg by mouth 2 (two) times daily.    . dorzolamide-timolol (COSOPT) 22.3-6.8 MG/ML ophthalmic solution place 1 drop into affected eye twice a day  1  . famotidine (PEPCID) 20 MG tablet Take 20 mg by mouth 2 (two) times daily.    . ferrous sulfate 325 (65 FE) MG tablet Take 325 mg by mouth 2 (two) times daily with a meal.     . Glucosamine-MSM-Hyaluronic Acd (JOINT HEALTH) 750-375-30 MG TABS Take 2 tablets by mouth daily.    . hydroxychloroquine (PLAQUENIL) 200 MG tablet take 1 tablet by mouth every morning and 1 tablet by mouth every evening 180 tablet 1  . insulin lispro (HUMALOG) 100 UNIT/ML injection Inject into the skin 3 (three) times daily before meals.    . Iron-Vitamins (GERITOL COMPLETE) TABS Take 1 tablet by mouth daily.    . Lactobacillus (ACIDOPHILUS) CAPS capsule Take 1 capsule by mouth daily.    Marland Kitchen latanoprost (XALATAN) 0.005 % ophthalmic solution place 1 drop into both eyes every evening  1  . LEVEMIR 100 UNIT/ML injection Inject 7 Units into the skin 2 (two) times daily.   1  . levothyroxine (SYNTHROID, LEVOTHROID) 300 MCG tablet Take 300 mcg by mouth 3 (three) times a week.     . lovastatin (MEVACOR) 40 MG tablet Take 40 mg by mouth at bedtime.     . minoxidil (LONITEN) 10 MG tablet Take 10 mg by mouth 2 (two) times daily.     . Multiple Vitamin (MULTIVITAMIN) tablet Take 1 tablet by mouth daily.     . Omega-3 Fatty Acids (FISH OIL) 1200 MG CAPS Take 1 capsule by mouth daily.    . Pancrelipase, Lip-Prot-Amyl, (CREON) 6000 UNITS CPEP Take 4 capsules by mouth 3 (three) times daily.    . potassium citrate (UROCIT-K) 10 MEQ (1080 MG) SR tablet Take 10 mEq by mouth 2 (two) times daily.     . SUNItinib (SUTENT) 37.5 MG capsule Take 1 capsule (37.5 mg) by mouth once daily for 2 weeks on, 1 week off, repeat every 42 days 28 capsule 1  . valsartan-hydrochlorothiazide (DIOVAN-HCT) 160-12.5 MG per tablet     . VOLTAREN 1 % GEL apply 3 grams to LARGE JOINTS  three times a day if needed  0   No current facility-administered medications for this visit.     PHYSICAL EXAMINATION: ECOG PERFORMANCE STATUS: 1 - Symptomatic but completely ambulatory  . Vitals:   11/05/17 0826  BP: 138/78  Pulse: (!) 50  Resp: 20  Temp: 98.3 F (36.8 C)  SpO2: 98%   Filed Weights   11/05/17 0826  Weight: 194 lb 9.6 oz (88.3 kg)   .Body mass index is 27.92 kg/m. Marland Kitchen GENERAL:alert, in no acute distress and comfortable SKIN: no acute rashes, no significant lesions EYES: conjunctiva are pink and non-injected, sclera anicteric OROPHARYNX: MMM, no exudates, no oropharyngeal erythema or ulceration NECK: supple, no JVD LYMPH:  no palpable lymphadenopathy in the cervical, axillary or inguinal regions LUNGS: clear to auscultation b/l with normal respiratory effort HEART: regular rate & rhythm ABDOMEN:  normoactive bowel sounds , non tender, not distended. Extremity: no pedal edema PSYCH: alert & oriented x 3 with fluent speech NEURO: no focal motor/sensory deficits   LABORATORY DATA:   I have reviewed the data as listed  . CBC Latest Ref Rng & Units 11/05/2017 09/24/2017 08/13/2017  WBC 4.0 - 10.3 K/uL 4.4 3.8(L) 4.2  Hemoglobin 13.0 - 17.1 g/dL 13.6 - 13.5  Hematocrit 38.4 - 49.9 % 40.2 40.3 40.1  Platelets 140 - 400 K/uL 205 188 252   . CBC    Component Value Date/Time   WBC 4.4 11/05/2017 0809   RBC  3.59 (L) 11/05/2017 0809   RBC 3.59 (L) 11/05/2017 0809   HGB 13.6 11/05/2017 0809   HGB 14.0 06/11/2017 0808   HCT 40.2 11/05/2017 0809   HCT 40.7 06/11/2017 0808   PLT 205 11/05/2017 0809   PLT 188 09/24/2017 0802   PLT 223 06/11/2017 0808   MCV 112.0 (H) 11/05/2017 0809   MCV 112.1 (H) 06/11/2017 0808   MCH 37.9 (H) 11/05/2017 0809   MCHC 33.8 11/05/2017 0809   RDW 13.1 11/05/2017 0809   RDW 13.9 06/11/2017 0808   LYMPHSABS 1.6 11/05/2017 0809   LYMPHSABS 1.6 06/11/2017 0808   MONOABS 0.4 11/05/2017 0809   MONOABS 0.5 06/11/2017 0808   EOSABS 0.1 11/05/2017 0809   EOSABS 0.1 06/11/2017 0808   BASOSABS 0.1 11/05/2017 0809   BASOSABS 0.0 06/11/2017 0808   . CMP Latest Ref Rng & Units 11/05/2017 09/24/2017 08/13/2017  Glucose 70 - 140 mg/dL 251(H) 327(H) 209(H)  BUN 7 - 26 mg/dL 23 15 18   Creatinine 0.70 - 1.30 mg/dL 1.96(H) 1.59(H) 1.71(H)  Sodium 136 - 145 mmol/L 141 140 140  Potassium 3.5 - 5.1 mmol/L 4.1 4.2 4.0  Chloride 98 - 109 mmol/L 106 110(H) 112(H)  CO2 22 - 29 mmol/L 27 20(L) 20(L)  Calcium 8.4 - 10.4 mg/dL 9.6 8.3(L) 8.8  Total Protein 6.4 - 8.3 g/dL 6.1(L) 5.7(L) 5.8(L)  Total Bilirubin 0.2 - 1.2 mg/dL 1.4(H) 1.4(H) 1.0  Alkaline Phos 40 - 150 U/L 88 86 80  AST 5 - 34 U/L 26 33 28  ALT 0 - 55 U/L 29 45 31    RADIOGRAPHIC STUDIES: I have personally reviewed the radiological images as listed and agreed with the findings in the report. No results found.   ASSESSMENT & PLAN:   68 year old Caucasian male with  #1 Recurrent metastatic Renal cell carcinoma with multiple lung metastases.  Has been on stable on 1st line Sutent for >57yr. Has had SBRT to the dominant right lung nodule with partial response. No lab or clinical evidence of  metastatic RCC progression at this time.  PET/CT 08/08/2016 with a couple of mildly enlarge LLL pulmonary nodules - no other overt evidence of RCC progression at this time.  PET CT scan 11/06/2016 - no overt evidence of RCC  progression at this time .  PET/CT 03/13/2017 - no overt evidence of RCC progression at this time .  PET/CT 08/10/2017 - no overt evidence of RCC progression at this time .  PET/ CT done 08/10/2017, shows no evidence of  Progression at this time.  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 Confirmed by [place].  #4 mild thrombocytopenia likely related to Sutent- resolved with dose reduction. #5 elevation in bilirubin levels likely due to Sutent. Bilirubin stable #6 grade 1 fatigue due to Sutent -better with dose reduction and changing schedule to 2 week on and 1 week. #7 Dysguesia from Sutent - manageable per patient. He is trying to eat as well as he can and is maintaining his body weight.  #8 RBC macrocytosis due to Sutent #9 chronic kidney disease/single kidney - creatinine stable @ 1.5-1.9  Recommended to avoid NSAIDS and other nephrotoxic medications. Optimize control of DM2 #10 rheumatoid arthritis -follows with Dr. Estanislado Pandy for mx Plan -Discussed pt labwork today; blood counts are stable, kidney levels slightly higher with Creatinine at 1.96.  -Advised that pt continue to stay well hydrated.  -Pt has no lab or clinical evidence of disease progression at this time -The pt has no prohibitive toxicities from Sutent at this time and will continue Sutent.   --Continue follow-up with primary care physician and rheumatology. -Repeat PET scan in 5 weeks  #11 Uncontrolled DM2 BS>300's today -continue f/u with Dr Philip Aspen for continue mx   PET/CT in 5 weeks RTC with Dr Irene Limbo in 6 weeks with labs    The total time spent in the appointment was 20 minutes and more than 50% was on counseling and direct patient cares.   Dylan Lone Dylan Jefferson [place] AAHIVMS [facility] Hematology/Oncology Physician [place]  (Office):       [phone] (Work cell):  [phone] (Fax):           [phone]  This document serves as a record of services  personally performed by Dylan Lone, Dylan Jefferson. It was created on his behalf by Baldwin [place], a trained medical scribe. The creation of this record is based on the scribe's personal observations and the provider's statements to them.   .I have reviewed the above documentation for accuracy and completeness, and I agree with the above. Dylan Jefferson

## 2017-11-05 ENCOUNTER — Encounter: Payer: Self-pay | Admitting: Hematology

## 2017-11-05 ENCOUNTER — Telehealth: Payer: Self-pay

## 2017-11-05 ENCOUNTER — Inpatient Hospital Stay: Payer: Medicare Other | Attending: Hematology | Admitting: Hematology

## 2017-11-05 ENCOUNTER — Inpatient Hospital Stay: Payer: Medicare Other

## 2017-11-05 VITALS — BP 138/78 | HR 50 | Temp 98.3°F | Resp 20 | Ht 70.0 in | Wt 194.6 lb

## 2017-11-05 DIAGNOSIS — E039 Hypothyroidism, unspecified: Secondary | ICD-10-CM | POA: Insufficient documentation

## 2017-11-05 DIAGNOSIS — C7801 Secondary malignant neoplasm of right lung: Secondary | ICD-10-CM

## 2017-11-05 DIAGNOSIS — M069 Rheumatoid arthritis, unspecified: Secondary | ICD-10-CM | POA: Insufficient documentation

## 2017-11-05 DIAGNOSIS — Z9049 Acquired absence of other specified parts of digestive tract: Secondary | ICD-10-CM | POA: Insufficient documentation

## 2017-11-05 DIAGNOSIS — Z905 Acquired absence of kidney: Secondary | ICD-10-CM | POA: Insufficient documentation

## 2017-11-05 DIAGNOSIS — N289 Disorder of kidney and ureter, unspecified: Secondary | ICD-10-CM | POA: Diagnosis not present

## 2017-11-05 DIAGNOSIS — C7889 Secondary malignant neoplasm of other digestive organs: Secondary | ICD-10-CM | POA: Insufficient documentation

## 2017-11-05 DIAGNOSIS — Z794 Long term (current) use of insulin: Secondary | ICD-10-CM | POA: Diagnosis not present

## 2017-11-05 DIAGNOSIS — E1165 Type 2 diabetes mellitus with hyperglycemia: Secondary | ICD-10-CM | POA: Diagnosis not present

## 2017-11-05 DIAGNOSIS — C649 Malignant neoplasm of unspecified kidney, except renal pelvis: Secondary | ICD-10-CM | POA: Diagnosis present

## 2017-11-05 DIAGNOSIS — Z87891 Personal history of nicotine dependence: Secondary | ICD-10-CM | POA: Diagnosis not present

## 2017-11-05 DIAGNOSIS — D696 Thrombocytopenia, unspecified: Secondary | ICD-10-CM | POA: Insufficient documentation

## 2017-11-05 DIAGNOSIS — Z7982 Long term (current) use of aspirin: Secondary | ICD-10-CM | POA: Diagnosis not present

## 2017-11-05 DIAGNOSIS — R5383 Other fatigue: Secondary | ICD-10-CM | POA: Insufficient documentation

## 2017-11-05 DIAGNOSIS — Z79899 Other long term (current) drug therapy: Secondary | ICD-10-CM | POA: Insufficient documentation

## 2017-11-05 DIAGNOSIS — M1990 Unspecified osteoarthritis, unspecified site: Secondary | ICD-10-CM | POA: Insufficient documentation

## 2017-11-05 DIAGNOSIS — C7800 Secondary malignant neoplasm of unspecified lung: Secondary | ICD-10-CM

## 2017-11-05 LAB — CBC WITH DIFFERENTIAL/PLATELET
Basophils Absolute: 0.1 10*3/uL (ref 0.0–0.1)
Basophils Relative: 1 %
Eosinophils Absolute: 0.1 10*3/uL (ref 0.0–0.5)
Eosinophils Relative: 2 %
HEMATOCRIT: 40.2 % (ref 38.4–49.9)
HEMOGLOBIN: 13.6 g/dL (ref 13.0–17.1)
LYMPHS PCT: 36 %
Lymphs Abs: 1.6 10*3/uL (ref 0.9–3.3)
MCH: 37.9 pg — ABNORMAL HIGH (ref 27.2–33.4)
MCHC: 33.8 g/dL (ref 32.0–36.0)
MCV: 112 fL — AB (ref 79.3–98.0)
MONO ABS: 0.4 10*3/uL (ref 0.1–0.9)
Monocytes Relative: 8 %
NEUTROS ABS: 2.3 10*3/uL (ref 1.5–6.5)
NEUTROS PCT: 53 %
Platelets: 205 10*3/uL (ref 140–400)
RBC: 3.59 MIL/uL — AB (ref 4.20–5.82)
RDW: 13.1 % (ref 11.0–14.6)
WBC: 4.4 10*3/uL (ref 4.0–10.3)

## 2017-11-05 LAB — CMP (CANCER CENTER ONLY)
ALT: 29 U/L (ref 0–55)
ANION GAP: 8 (ref 3–11)
AST: 26 U/L (ref 5–34)
Albumin: 3.6 g/dL (ref 3.5–5.0)
Alkaline Phosphatase: 88 U/L (ref 40–150)
BILIRUBIN TOTAL: 1.4 mg/dL — AB (ref 0.2–1.2)
BUN: 23 mg/dL (ref 7–26)
CO2: 27 mmol/L (ref 22–29)
Calcium: 9.6 mg/dL (ref 8.4–10.4)
Chloride: 106 mmol/L (ref 98–109)
Creatinine: 1.96 mg/dL — ABNORMAL HIGH (ref 0.70–1.30)
GFR, EST AFRICAN AMERICAN: 39 mL/min — AB (ref >60)
GFR, Estimated: 33 mL/min — ABNORMAL LOW (ref >60)
Glucose, Bld: 251 mg/dL — ABNORMAL HIGH (ref 70–140)
POTASSIUM: 4.1 mmol/L (ref 3.5–5.1)
SODIUM: 141 mmol/L (ref 136–145)
TOTAL PROTEIN: 6.1 g/dL — AB (ref 6.4–8.3)

## 2017-11-05 LAB — LACTATE DEHYDROGENASE: LDH: 223 U/L (ref 125–245)

## 2017-11-05 LAB — RETICULOCYTES
RBC.: 3.59 MIL/uL — AB (ref 4.20–5.82)
RETIC COUNT ABSOLUTE: 111.3 10*3/uL — AB (ref 34.8–93.9)
Retic Ct Pct: 3.1 % — ABNORMAL HIGH (ref 0.8–1.8)

## 2017-11-05 MED FILL — SUTENT 37.5 MG CAPSULE: 37.5 | 42 days supply | Qty: 28 | Fill #1

## 2017-11-05 NOTE — Telephone Encounter (Signed)
Printed avs and calender of upcoming appointment. Per 4/4 los 

## 2017-12-07 ENCOUNTER — Encounter (HOSPITAL_COMMUNITY)
Admission: RE | Admit: 2017-12-07 | Discharge: 2017-12-07 | Disposition: A | Discharge status: Home / Self Care | Payer: Medicare Other | Source: Ambulatory Visit | Attending: Hematology | Admitting: Hematology

## 2017-12-07 DIAGNOSIS — C7801 Secondary malignant neoplasm of right lung: Secondary | ICD-10-CM | POA: Diagnosis present

## 2017-12-07 DIAGNOSIS — C649 Malignant neoplasm of unspecified kidney, except renal pelvis: Secondary | ICD-10-CM | POA: Diagnosis present

## 2017-12-07 LAB — GLUCOSE, CAPILLARY: GLUCOSE-CAPILLARY: 114 mg/dL — AB (ref 65–99)

## 2017-12-07 MED ORDER — FLUDEOXYGLUCOSE F - 18 (FDG) INJECTION
10.1500 | Freq: Once | INTRAVENOUS | Status: AC | PRN
  Administered 2017-12-07: 10.15 via INTRAVENOUS

## 2017-12-09 ENCOUNTER — Other Ambulatory Visit: Payer: Self-pay | Admitting: Hematology

## 2017-12-09 DIAGNOSIS — C649 Malignant neoplasm of unspecified kidney, except renal pelvis: Secondary | ICD-10-CM

## 2017-12-10 ENCOUNTER — Telehealth: Payer: Self-pay | Admitting: Hematology

## 2017-12-10 NOTE — Telephone Encounter (Signed)
Printed ROI for [employer] on 12/10/17, Release ID [id number]

## 2017-12-16 NOTE — Progress Notes (Signed)
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 2) Remote history of metastatic renal cell carcinoma Diagnosed with renal cell carcinoma 20 years ago and had a right nephrectomy. About 8 years after that he was noted to have abdominal recurrence in his pancreas spleen and small intestine and had a Whipple's procedure and significant abdominal surgery and notes that 10 out of 17 lymph nodes were positive. He also had his gallbladder removed. Postoperative course was complicated by an internal hemorrhage as per his report. Patient notes 2-3 years after that he had recurrence in his thyroid that led to a thyroidectomy. [June 2004] later he had partial gastrectomy for local recurrence. [February 2005] when he presented with GI bleeding. Patient notes that he has had no known evidence of recurrence over the last 8 years until his recent CT scan showed lung nodules.  Current Treatment: Sutent 37.5 mg po daily for 2 weeks on and 1 weeks off.  Previous treatment Sutent on cycle 1 - 50 mg by mouth daily for 4 weeks on and 2-weeks off. It was dose adjusted to 37.5 mg by mouth daily for 2 weeks with 1 week of to help mitigate issues with fatigue, mild hyperbilirubinemia, cytopenias. SBRT to dominant RML nodule.  INTERVAL HISTORY:  Dylan Jefferson is here for for his scheduled follow-up of his metastatic renal cell carcinoma. The patient's last visit with [place] was on 11/05/17. He is accompanied today by his wife. The pt reports that he is doing well overall and is looking forward to visiting his grandchildren soon.   The pt reports that he passed a  kidney stone 3 weeks ago which caused some bleeding but resolved after a couple days. He will see Dr Sharlett Iles in 2 weeks for follow up of his RA.   Of note since the patient's last visit, pt has had PET completed on 12/07/17 with results revealing No findings suspicious for recurrent or metastatic disease. Radiation changes in the right upper lobe. Stable bilateral pulmonary nodules measuring up to 4 mm, likely benign. Healing/healed right lateral 4th rib fracture. Status post right nephrectomy. Additional postsurgical changes as above.  Lab results today (12/17/17) of CBC, CMP, and Reticulocytes is as follows: all values are WNL except for WBC at 3.9k, RBC at 3.51, MCV at 112.8, MCH at 38.2, Retic ct pct at 3.2%, Retic ct abs at 112.3k, Glucose at 156, Creatinine at 1.69.  LDH 12/17/17 is pending.   On review of systems, pt reports good energy levels, stable joint pains, resolved hematuria, and denies abdominal pains, leg swelling, and any other symptoms.    REVIEW OF SYSTEMS:   A 10+ POINT REVIEW OF SYSTEMS WAS OBTAINED including neurology, dermatology, psychiatry, cardiac, respiratory, lymph, extremities, GI, GU, Musculoskeletal, constitutional, breasts, reproductive, HEENT.  All pertinent positives are noted in the HPI.  All others are negative.     Past Medical History:  Diagnosis Date  . Arthritis   . Cancer [facility])    Renal cell  . DDD (degenerative disc disease), cervical 05/28/2016  . DDD (degenerative disc disease), lumbar 05/28/2016  . Depression   . Diabetes ([place])   . Hyperlipidemia 05/28/2016  . Hypertension   . Hypothyroidism   .  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 Kidney disease   . Nephrolithiasis   . Osteoarthritis of both hands 05/28/2016  . Osteoarthritis of both knees 05/28/2016  . Peripheral vascular disorder ([place]) 05/28/2016  . 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 Rheumatoid arthritis ([place])   . Thyroid cancer  ([place]) 05/28/2016    . Past Surgical History:  Procedure Laterality Date  . CATARACT EXTRACTION Bilateral 2013  . CHOLECYSTECTOMY    . GASTRECTOMY     tumor removed  . LIPOMA EXCISION Right 1999  . NEPHRECTOMY Right   . PANCREATECTOMY    . SPLENECTOMY    . THYROIDECTOMY    . URETERAL STENT PLACEMENT Left 2012  . WHIPPLE PROCEDURE      . Social History   Tobacco Use  . Smoking status: Former Smoker    Years: 1.50  . Smokeless tobacco: Never Used  Substance Use Topics  . Alcohol use: Yes    Comment: occasional beer  . Drug use: No    ALLERGIES:  has No Known Allergies.  MEDICATIONS:  Current Outpatient Medications  Medication Sig Dispense Refill  . amLODipine (NORVASC) 10 MG tablet take 1 tablet by mouth once daily 90 tablet 3  . Ascorbic Acid (VITAMIN C) 1000 MG tablet Take 1,000 mg by mouth daily.    Marland Kitchen aspirin 81 MG tablet Take 81 mg by mouth daily.    Marland Kitchen atenolol (TENORMIN) 100 MG tablet     . augmented betamethasone dipropionate (DIPROLENE-AF) 0.05 % cream   0  . calcium citrate-vitamin D (CITRACAL+D) 315-200 MG-UNIT per tablet Take 2 tablets by mouth daily.     . Cyanocobalamin (VITAMIN B-12) 2500 MCG SUBL Take by mouth daily.     Marland Kitchen docusate sodium (COLACE) 100 MG capsule Take 100 mg by mouth 2 (two) times daily.    . dorzolamide-timolol (COSOPT) 22.3-6.8 MG/ML ophthalmic solution place 1 drop into affected eye twice a day  1  . famotidine (PEPCID) 20 MG tablet Take 20 mg by mouth 2 (two) times daily.    . ferrous sulfate 325 (65 FE) MG tablet Take 325 mg by mouth 2 (two) times daily with a meal.     . Glucosamine-MSM-Hyaluronic Acd (JOINT HEALTH) 750-375-30 MG TABS Take 2 tablets by mouth daily.    . hydroxychloroquine (PLAQUENIL) 200 MG tablet take 1 tablet by mouth every morning and 1 tablet by mouth every evening 180 tablet 1  . insulin lispro (HUMALOG) 100 UNIT/ML injection Inject into the skin 3 (three) times daily before meals.    . Iron-Vitamins (GERITOL  COMPLETE) TABS Take 1 tablet by mouth daily.    . Lactobacillus (ACIDOPHILUS) CAPS capsule Take 1 capsule by mouth daily.    Marland Kitchen latanoprost (XALATAN) 0.005 % ophthalmic solution place 1 drop into both eyes every evening  1  . LEVEMIR 100 UNIT/ML injection Inject 7 Units into the skin 2 (two) times daily.   1  . levothyroxine (SYNTHROID, LEVOTHROID) 300 MCG tablet Take 300 mcg by mouth 3 (three) times a week.     . lovastatin (MEVACOR) 40 MG tablet Take 40 mg by mouth at bedtime.     . minoxidil (LONITEN) 10 MG tablet Take 10 mg by mouth 2 (two) times daily.     . Multiple Vitamin (MULTIVITAMIN) tablet Take 1 tablet by mouth daily.    . Omega-3 Fatty Acids (FISH OIL) 1200 MG CAPS Take 1 capsule by mouth daily.    . Pancrelipase,  Lip-Prot-Amyl, (CREON) 6000 UNITS CPEP Take 4 capsules by mouth 3 (three) times daily.    . potassium citrate (UROCIT-K) 10 MEQ (1080 MG) SR tablet Take 10 mEq by mouth 2 (two) times daily.     . SUTENT 37.5 MG capsule TAKE 1 CAPSULE (37.5 MG) BY MOUTH ONCE DAILY FOR 2 WEEKS ON, 1 WEEK OFF, REPEAT EVERY 42 DAYS 28 capsule 1  . valsartan-hydrochlorothiazide (DIOVAN-HCT) 160-12.5 MG per tablet     . VOLTAREN 1 % GEL apply 3 grams to LARGE JOINTS three times a day if needed  0   No current facility-administered medications for this visit.     PHYSICAL EXAMINATION: ECOG PERFORMANCE STATUS: 1 - Symptomatic but completely ambulatory  . Vitals:   12/17/17 0825  BP: 116/64  Pulse: (!) 55  Resp: 16  Temp: 98 F (36.7 C)  SpO2: 99%   Filed Weights   12/17/17 0825  Weight: 201 lb 1.6 oz (91.2 kg)   .Body mass index is 28.85 kg/m.  GENERAL:alert, in no acute distress and comfortable SKIN: no acute rashes, no significant lesions EYES: conjunctiva are pink and non-injected, sclera anicteric OROPHARYNX: MMM, no exudates, no oropharyngeal erythema or ulceration NECK: supple, no JVD LYMPH:  no palpable lymphadenopathy in the cervical, axillary or inguinal  regions LUNGS: clear to auscultation b/l with normal respiratory effort HEART: regular rate & rhythm ABDOMEN:  normoactive bowel sounds , non tender, not distended. Extremity: no pedal edema PSYCH: alert & oriented x 3 with fluent speech NEURO: no focal motor/sensory deficits   LABORATORY DATA:   I have reviewed the data as listed  . CBC Latest Ref Rng & Units 12/17/2017 11/05/2017 09/24/2017  WBC 4.0 - 10.3 K/uL 3.9(L) 4.4 3.8(L)  Hemoglobin 13.0 - 17.1 g/dL 13.4 13.6 13.5  Hematocrit 38.4 - 49.9 % 39.6 40.2 40.3  Platelets 140 - 400 K/uL 198 205 188  ANC 1.9k . CBC    Component Value Date/Time   WBC 3.9 (L) 12/17/2017 0735   WBC 4.4 11/05/2017 0809   RBC 3.51 (L) 12/17/2017 0735   RBC 3.51 (L) 12/17/2017 0735   HGB 13.4 12/17/2017 0735   HGB 14.0 06/11/2017 0808   HCT 39.6 12/17/2017 0735   HCT 40.7 06/11/2017 0808   PLT 198 12/17/2017 0735   PLT 223 06/11/2017 0808   MCV 112.8 (H) 12/17/2017 0735   MCV 112.1 (H) 06/11/2017 0808   MCH 38.2 (H) 12/17/2017 0735   MCHC 33.8 12/17/2017 0735   RDW 13.8 12/17/2017 0735   RDW 13.9 06/11/2017 0808   LYMPHSABS 1.4 12/17/2017 0735   LYMPHSABS 1.6 06/11/2017 0808   MONOABS 0.4 12/17/2017 0735   MONOABS 0.5 06/11/2017 0808   EOSABS 0.1 12/17/2017 0735   EOSABS 0.1 06/11/2017 0808   BASOSABS 0.0 12/17/2017 0735   BASOSABS 0.0 06/11/2017 0808   . CMP Latest Ref Rng & Units 12/17/2017 11/05/2017 09/24/2017  Glucose 70 - 140 mg/dL 156(H) 251(H) 327(H)  BUN 7 - 26 mg/dL 18 23 15   Creatinine 0.70 - 1.30 mg/dL 1.69(H) 1.96(H) 1.59(H)  Sodium 136 - 145 mmol/L 141 141 140  Potassium 3.5 - 5.1 mmol/L 4.1 4.1 4.2  Chloride 98 - 109 mmol/L 109 106 110(H)  CO2 22 - 29 mmol/L 24 27 20(L)  Calcium 8.4 - 10.4 mg/dL 8.6 9.6 8.3(L)  Total Protein 6.4 - 8.3 g/dL 5.8(L) 6.1(L) 5.7(L)  Total Bilirubin 0.2 - 1.2 mg/dL 1.3(H) 1.4(H) 1.4(H)  Alkaline Phos 40 - 150 U/L 69 88 86  AST 5 - 34 U/L 39(H) 26 33  ALT 0 - 55 U/L 38 29 45     RADIOGRAPHIC STUDIES: I have personally reviewed the radiological images as listed and agreed with the findings in the report. Nm Pet Image Restag (ps) Skull Base To Thigh  Result Date: 12/07/2017 CLINICAL DATA:  Subsequent treatment strategy for stage IV renal cell carcinoma with right lung metastases. EXAM: NUCLEAR MEDICINE PET SKULL BASE TO THIGH TECHNIQUE: 10.15 mCi F-18 FDG was injected intravenously. Full-ring PET imaging was performed from the skull base to thigh after the radiotracer. CT data was obtained and used for attenuation correction and anatomic localization. Fasting blood glucose: 114 mg/dl COMPARISON:  PET-CT dated 08/10/2017 FINDINGS: Mediastinal blood pool activity: SUV max 2.8 NECK: No hypermetabolic cervical lymphadenopathy. Incidental CT findings: none CHEST: Stable 3 mm right upper lobe pulmonary nodule (series 8/image 37) and 4 mm left lower lobe pulmonary nodule (series 8/image 67), likely benign. Radiation changes in the right upper lobe (series 8/image 43). No hypermetabolic pulmonary nodules. No hypermetabolic thoracic lymphadenopathy. Incidental CT findings: Mild coronary atherosclerosis of the LAD. ABDOMEN/PELVIS: Status post right nephrectomy. No hypermetabolic abdominopelvic lymphadenopathy. No abnormal hypermetabolism in the liver, pancreas, or adrenal glands. Incidental CT findings: Status post splenectomy. Status post cholecystectomy. Pneumobilia. 9 mm nonobstructing left lower pole renal calculus. No hydronephrosis. Thick-walled bladder. Atherosclerotic calcifications the abdominal aorta and branch vessels. SKELETON: No focal hypermetabolic activity to suggest skeletal metastasis. Healing/healed right lateral 4th rib fracture with associated callus and mild hypermetabolism, max SUV 2.4, improved. Incidental CT findings: none IMPRESSION: No findings suspicious for recurrent or metastatic disease. Radiation changes in the right upper lobe. Stable bilateral pulmonary  nodules measuring up to 4 mm, likely benign. Healing/healed right lateral 4th rib fracture. Status post right nephrectomy. Additional postsurgical changes as above. Electronically Signed   By: Julian Hy M.D.   On: 12/07/2017 12:58     ASSESSMENT & PLAN:   67 year old Caucasian male with  #1 Recurrent metastatic Renal cell carcinoma with multiple lung metastases.  Has been on stable on 1st line Sutent for >24yr. Has had SBRT to the dominant right lung nodule with partial response. No lab or clinical evidence of metastatic RCC progression at this time.  PET/CT 08/08/2016 with a couple of mildly enlarge LLL pulmonary nodules - no other overt evidence of RCC progression at this time.  PET CT scan 11/06/2016 - no overt evidence of RCC progression at this time .  PET/CT 03/13/2017 - no overt evidence of RCC progression at this time .  PET/CT 08/10/2017 - no overt evidence of RCC progression at this time .  PET/ CT done 08/10/2017, shows no evidence of  Progression at this time.  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 Confirmed by [place].  #4 mild thrombocytopenia likely related to Sutent- resolved with dose reduction. #5 elevation in bilirubin levels likely due to Sutent. Bilirubin stable #6 grade 1 fatigue due to Sutent -better with dose reduction and changing schedule to 2 week on and 1 week. #7 Dysguesia from Sutent - manageable per patient. He is trying to eat as well as he can and is maintaining his body weight.  #8 RBC macrocytosis due to Sutent #9 chronic kidney disease/single kidney - creatinine stable @ 1.5-1.9  Recommended to avoid NSAIDS and other nephrotoxic medications. Optimize control of DM2 #10 rheumatoid arthritis -follows with Dr. Estanislado Pandy for mx Plan -Discussed pt labwork today, 12/17/17; blood counts and chemistries  are stable.  -Discussed 12/07/17 PET which revealed No findings suspicious for recurrent or metastatic  disease. -Will repeat PET in 4 months  -The pt shows no clinical, radiographic, or lab progression of his renal carcinoma at this time.  -The pt has no prohibitive toxicities from continuing Sutent at this time.  -continue Sutent 2week on 1 week off at current dose of 37.5mg   #11 DM2 -continue f/u with Dr Philip Aspen for continue mx   RTC with Dr Irene Limbo with labs in 6 weeks   . The total time spent in the appointment was 20 minutes and more than 50% was on counseling and direct patient cares.     Dylan Lone MD [place] AAHIVMS [facility] Hematology/Oncology Physician [place]  (Office):       [phone] (Work cell):  [phone] (Fax):           [phone]  This document serves as a record of services personally performed by Dylan Lone, MD. It was created on his behalf by Baldwin [place], a trained medical scribe. The creation of this record is based on the scribe's personal observations and the provider's statements to them.   .I have reviewed the above documentation for accuracy and completeness, and I agree with the above. Dylan Genera MD MS

## 2017-12-17 ENCOUNTER — Inpatient Hospital Stay: Payer: Medicare Other | Attending: Hematology | Admitting: Hematology

## 2017-12-17 ENCOUNTER — Inpatient Hospital Stay: Payer: Medicare Other

## 2017-12-17 ENCOUNTER — Ambulatory Visit: Payer: Medicare Other | Admitting: Rheumatology

## 2017-12-17 ENCOUNTER — Telehealth: Payer: Self-pay | Admitting: Hematology

## 2017-12-17 ENCOUNTER — Encounter: Payer: Self-pay | Admitting: Hematology

## 2017-12-17 VITALS — BP 116/64 | HR 55 | Temp 98.0°F | Resp 16 | Ht 70.0 in | Wt 201.1 lb

## 2017-12-17 DIAGNOSIS — E039 Hypothyroidism, unspecified: Secondary | ICD-10-CM | POA: Diagnosis not present

## 2017-12-17 DIAGNOSIS — M069 Rheumatoid arthritis, unspecified: Secondary | ICD-10-CM | POA: Diagnosis not present

## 2017-12-17 DIAGNOSIS — C649 Malignant neoplasm of unspecified kidney, except renal pelvis: Secondary | ICD-10-CM | POA: Insufficient documentation

## 2017-12-17 DIAGNOSIS — C7801 Secondary malignant neoplasm of right lung: Secondary | ICD-10-CM | POA: Insufficient documentation

## 2017-12-17 DIAGNOSIS — R5383 Other fatigue: Secondary | ICD-10-CM | POA: Diagnosis not present

## 2017-12-17 DIAGNOSIS — C7800 Secondary malignant neoplasm of unspecified lung: Secondary | ICD-10-CM

## 2017-12-17 DIAGNOSIS — Z8719 Personal history of other diseases of the digestive system: Secondary | ICD-10-CM | POA: Diagnosis not present

## 2017-12-17 DIAGNOSIS — D7589 Other specified diseases of blood and blood-forming organs: Secondary | ICD-10-CM | POA: Diagnosis not present

## 2017-12-17 DIAGNOSIS — Z79899 Other long term (current) drug therapy: Secondary | ICD-10-CM | POA: Diagnosis not present

## 2017-12-17 DIAGNOSIS — I10 Essential (primary) hypertension: Secondary | ICD-10-CM | POA: Insufficient documentation

## 2017-12-17 DIAGNOSIS — Z85528 Personal history of other malignant neoplasm of kidney: Secondary | ICD-10-CM | POA: Insufficient documentation

## 2017-12-17 DIAGNOSIS — Z923 Personal history of irradiation: Secondary | ICD-10-CM | POA: Insufficient documentation

## 2017-12-17 DIAGNOSIS — N289 Disorder of kidney and ureter, unspecified: Secondary | ICD-10-CM | POA: Diagnosis not present

## 2017-12-17 DIAGNOSIS — Z87442 Personal history of urinary calculi: Secondary | ICD-10-CM | POA: Diagnosis not present

## 2017-12-17 DIAGNOSIS — Z794 Long term (current) use of insulin: Secondary | ICD-10-CM

## 2017-12-17 DIAGNOSIS — Z8507 Personal history of malignant neoplasm of pancreas: Secondary | ICD-10-CM | POA: Insufficient documentation

## 2017-12-17 DIAGNOSIS — Z9049 Acquired absence of other specified parts of digestive tract: Secondary | ICD-10-CM | POA: Diagnosis not present

## 2017-12-17 DIAGNOSIS — Z87891 Personal history of nicotine dependence: Secondary | ICD-10-CM | POA: Diagnosis not present

## 2017-12-17 DIAGNOSIS — D696 Thrombocytopenia, unspecified: Secondary | ICD-10-CM | POA: Diagnosis not present

## 2017-12-17 DIAGNOSIS — E785 Hyperlipidemia, unspecified: Secondary | ICD-10-CM | POA: Insufficient documentation

## 2017-12-17 DIAGNOSIS — Z7982 Long term (current) use of aspirin: Secondary | ICD-10-CM | POA: Insufficient documentation

## 2017-12-17 DIAGNOSIS — Z85850 Personal history of malignant neoplasm of thyroid: Secondary | ICD-10-CM | POA: Insufficient documentation

## 2017-12-17 DIAGNOSIS — M1990 Unspecified osteoarthritis, unspecified site: Secondary | ICD-10-CM | POA: Diagnosis not present

## 2017-12-17 DIAGNOSIS — E1151 Type 2 diabetes mellitus with diabetic peripheral angiopathy without gangrene: Secondary | ICD-10-CM | POA: Insufficient documentation

## 2017-12-17 DIAGNOSIS — Z85068 Personal history of other malignant neoplasm of small intestine: Secondary | ICD-10-CM | POA: Insufficient documentation

## 2017-12-17 DIAGNOSIS — Y842 Radiological procedure and radiotherapy as the cause of abnormal reaction of the patient, or of later complication, without mention of misadventure at the time of the procedure: Secondary | ICD-10-CM | POA: Diagnosis not present

## 2017-12-17 LAB — CBC WITH DIFFERENTIAL (CANCER CENTER ONLY)
BASOS ABS: 0 10*3/uL (ref 0.0–0.1)
BASOS PCT: 1 %
EOS ABS: 0.1 10*3/uL (ref 0.0–0.5)
Eosinophils Relative: 2 %
HEMATOCRIT: 39.6 % (ref 38.4–49.9)
Hemoglobin: 13.4 g/dL (ref 13.0–17.1)
Lymphocytes Relative: 37 %
Lymphs Abs: 1.4 10*3/uL (ref 0.9–3.3)
MCH: 38.2 pg — ABNORMAL HIGH (ref 27.2–33.4)
MCHC: 33.8 g/dL (ref 32.0–36.0)
MCV: 112.8 fL — ABNORMAL HIGH (ref 79.3–98.0)
Monocytes Absolute: 0.4 10*3/uL (ref 0.1–0.9)
Monocytes Relative: 11 %
NEUTROS ABS: 1.9 10*3/uL (ref 1.5–6.5)
Neutrophils Relative %: 49 %
PLATELETS: 198 10*3/uL (ref 140–400)
RBC: 3.51 MIL/uL — ABNORMAL LOW (ref 4.20–5.82)
RDW: 13.8 % (ref 11.0–14.6)
WBC Count: 3.9 10*3/uL — ABNORMAL LOW (ref 4.0–10.3)

## 2017-12-17 LAB — CMP (CANCER CENTER ONLY)
ALBUMIN: 3.8 g/dL (ref 3.5–5.0)
ALK PHOS: 69 U/L (ref 40–150)
ALT: 38 U/L (ref 0–55)
ANION GAP: 8 (ref 3–11)
AST: 39 U/L — ABNORMAL HIGH (ref 5–34)
BILIRUBIN TOTAL: 1.3 mg/dL — AB (ref 0.2–1.2)
BUN: 18 mg/dL (ref 7–26)
CALCIUM: 8.6 mg/dL (ref 8.4–10.4)
CO2: 24 mmol/L (ref 22–29)
Chloride: 109 mmol/L (ref 98–109)
Creatinine: 1.69 mg/dL — ABNORMAL HIGH (ref 0.70–1.30)
GFR, EST AFRICAN AMERICAN: 46 mL/min — AB (ref >60)
GFR, Estimated: 40 mL/min — ABNORMAL LOW (ref >60)
GLUCOSE: 156 mg/dL — AB (ref 70–140)
Potassium: 4.1 mmol/L (ref 3.5–5.1)
Sodium: 141 mmol/L (ref 136–145)
TOTAL PROTEIN: 5.8 g/dL — AB (ref 6.4–8.3)

## 2017-12-17 LAB — RETICULOCYTES
RBC.: 3.51 MIL/uL — AB (ref 4.20–5.82)
RETIC COUNT ABSOLUTE: 112.3 10*3/uL — AB (ref 34.8–93.9)
RETIC CT PCT: 3.2 % — AB (ref 0.8–1.8)

## 2017-12-17 LAB — LACTATE DEHYDROGENASE: LDH: 241 U/L (ref 125–245)

## 2017-12-17 MED FILL — SUTENT 37.5 MG CAPSULE: 37.5 | 42 days supply | Qty: 28 | Fill #0

## 2017-12-17 NOTE — Telephone Encounter (Signed)
Scheduled appt per 5/16 los - Gave patient aVS and calender per los.  

## 2018-01-22 NOTE — Progress Notes (Signed)
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 2) Remote history of metastatic renal cell carcinoma Diagnosed with renal cell carcinoma 20 years ago and had a right nephrectomy. About 8 years after that he was noted to have abdominal recurrence in his pancreas spleen and small intestine and had a Whipple's procedure and significant abdominal surgery and notes that 10 out of 17 lymph nodes were positive. He also had his gallbladder removed. Postoperative course was complicated by an internal hemorrhage as per his report. Patient notes 2-3 years after that he had recurrence in his thyroid that led to a thyroidectomy. [June 2004] later he had partial gastrectomy for local recurrence. [February 2005] when he presented with GI bleeding. Patient notes that he has had no known evidence of recurrence over the last 8 years until his recent CT scan showed lung nodules.  Current Treatment: Sutent 37.5 mg po daily for 2 weeks on and 1 weeks off.  Previous treatment Sutent on cycle 1 - 50 mg by mouth daily for 4 weeks on and 2-weeks off. It was dose adjusted to 37.5 mg by mouth daily for 2 weeks with 1 week of to help mitigate issues with fatigue, mild hyperbilirubinemia, cytopenias. SBRT to dominant RML nodule.  INTERVAL HISTORY:  [name] is here for for his scheduled follow-up of his metastatic renal cell carcinoma. The patient's last visit with [place] was on 12/17/17. The pt reports that he is doing well overall. The pt is currently on his week break off Sutent. In the interim the pt also stopped taking 81mg  aspirin in conversation with his PCP  Dr Philip Aspen.   The pt reports that he splashed some shampoo in his eyes this morning and presents noting that he has a very red right eye. He denies any changes in vision or associated pain. He also denies any other bleeding.   He also notes that he has developed some outer-hip pain recently.   Lab results today (01/25/18) of CBC and Reticulocytes is as follows: all values are WNL except for WBC at 2.9k, RBC at 3.64, MCV at 110.7, MCH at 38.2, ANC at 1.2k, Retic ct pct at 3.3%, Retic ct abs at 120.1k. CMP 01/25/18 is reviewed  On review of systems, pt reports red right eye, regular bowel movements, hip pain, and denies bleeding, vision changes, breathing difficulties, abdominal pains, back pain, and any other symptoms.   REVIEW OF SYSTEMS:   A 10+ POINT REVIEW OF SYSTEMS WAS OBTAINED including neurology, dermatology, psychiatry, cardiac, respiratory, lymph, extremities, GI, GU, Musculoskeletal, constitutional, breasts, reproductive, HEENT.  All pertinent positives are noted in the HPI.  All others are negative.    Past Medical History:  Diagnosis Date  . Arthritis   . Cancer [facility])    Renal cell  . DDD (degenerative disc disease), cervical 05/28/2016  . DDD (degenerative disc disease), lumbar 05/28/2016  . Depression   . Diabetes ([place])   . Hyperlipidemia 05/28/2016  . Hypertension   . Hypothyroidism   . 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 Kidney disease   . Nephrolithiasis   . Osteoarthritis of both hands 05/28/2016  . Osteoarthritis of both knees  05/28/2016  . Peripheral vascular disorder ([place]) 05/28/2016  . 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 Rheumatoid arthritis ([place])   . Thyroid cancer ([place]) 05/28/2016    . Past Surgical History:  Procedure Laterality Date  . CATARACT EXTRACTION Bilateral 2013  . CHOLECYSTECTOMY    . GASTRECTOMY     tumor removed  . LIPOMA EXCISION Right 1999  . NEPHRECTOMY Right   . PANCREATECTOMY     . SPLENECTOMY    . THYROIDECTOMY    . URETERAL STENT PLACEMENT Left 2012  . WHIPPLE PROCEDURE      . Social History   Tobacco Use  . Smoking status: Former Smoker    Years: 1.50  . Smokeless tobacco: Never Used  Substance Use Topics  . Alcohol use: Yes    Comment: occasional beer  . Drug use: No    ALLERGIES:  has No Known Allergies.  MEDICATIONS:  Current Outpatient Medications  Medication Sig Dispense Refill  . amLODipine (NORVASC) 10 MG tablet take 1 tablet by mouth once daily 90 tablet 3  . Ascorbic Acid (VITAMIN C) 1000 MG tablet Take 1,000 mg by mouth daily.    Marland Kitchen aspirin 81 MG tablet Take 81 mg by mouth daily.    Marland Kitchen atenolol (TENORMIN) 100 MG tablet     . augmented betamethasone dipropionate (DIPROLENE-AF) 0.05 % cream   0  . calcium citrate-vitamin D (CITRACAL+D) 315-200 MG-UNIT per tablet Take 2 tablets by mouth daily.     . Cyanocobalamin (VITAMIN B-12) 2500 MCG SUBL Take by mouth daily.     Marland Kitchen docusate sodium (COLACE) 100 MG capsule Take 100 mg by mouth 2 (two) times daily.    . dorzolamide-timolol (COSOPT) 22.3-6.8 MG/ML ophthalmic solution place 1 drop into affected eye twice a day  1  . famotidine (PEPCID) 20 MG tablet Take 20 mg by mouth 2 (two) times daily.    . ferrous sulfate 325 (65 FE) MG tablet Take 325 mg by mouth 2 (two) times daily with a meal.     . Glucosamine-MSM-Hyaluronic Acd (JOINT HEALTH) 750-375-30 MG TABS Take 2 tablets by mouth daily.    . hydroxychloroquine (PLAQUENIL) 200 MG tablet take 1 tablet by mouth every morning and 1 tablet by mouth every evening 180 tablet 1  . insulin lispro (HUMALOG) 100 UNIT/ML injection Inject into the skin 3 (three) times daily before meals.    . Iron-Vitamins (GERITOL COMPLETE) TABS Take 1 tablet by mouth daily.    . Lactobacillus (ACIDOPHILUS) CAPS capsule Take 1 capsule by mouth daily.    Marland Kitchen latanoprost (XALATAN) 0.005 % ophthalmic solution place 1 drop into both eyes every evening  1  . LEVEMIR 100 UNIT/ML  injection Inject 7 Units into the skin 2 (two) times daily.   1  . levothyroxine (SYNTHROID, LEVOTHROID) 300 MCG tablet Take 300 mcg by mouth 3 (three) times a week.     . lovastatin (MEVACOR) 40 MG tablet Take 40 mg by mouth at bedtime.     . minoxidil (LONITEN) 10 MG tablet Take 10 mg by mouth 2 (two) times daily.     . Multiple Vitamin (MULTIVITAMIN) tablet Take 1 tablet by mouth daily.    . Omega-3 Fatty Acids (FISH OIL) 1200 MG CAPS Take 1 capsule by mouth daily.    . Pancrelipase, Lip-Prot-Amyl, (CREON) 6000 UNITS CPEP Take 4 capsules by mouth 3 (three) times daily.    . potassium citrate (UROCIT-K) 10 MEQ (1080 MG) SR tablet Take 10 mEq by mouth 2 (two)  times daily.     . SUTENT 37.5 MG capsule TAKE 1 CAPSULE (37.5 MG) BY MOUTH ONCE DAILY FOR 2 WEEKS ON, 1 WEEK OFF, REPEAT EVERY 42 DAYS 28 capsule 1  . valsartan-hydrochlorothiazide (DIOVAN-HCT) 160-12.5 MG per tablet     . VOLTAREN 1 % GEL apply 3 grams to LARGE JOINTS three times a day if needed  0   No current facility-administered medications for this visit.     PHYSICAL EXAMINATION: ECOG PERFORMANCE STATUS: 1 - Symptomatic but completely ambulatory  Vitals:   01/25/18 0931  BP: (!) 144/85  Pulse: (!) 50  Resp: 18  Temp: 98.4 F (36.9 C)  SpO2: 98%   Filed Weights   01/25/18 0931  Weight: 194 lb 3.2 oz (88.1 kg)   .Body mass index is 27.86 kg/m.  GENERAL:alert, in no acute distress and comfortable SKIN: no acute rashes, no significant lesions EYES: right eye sub-conjunctival hemorrhage  OROPHARYNX: MMM, no exudates, no oropharyngeal erythema or ulceration NECK: supple, no JVD LYMPH:  no palpable lymphadenopathy in the cervical, axillary or inguinal regions LUNGS: clear to auscultation b/l with normal respiratory effort HEART: regular rate & rhythm ABDOMEN:  normoactive bowel sounds , non tender, not distended. Extremity: no pedal edema PSYCH: alert & oriented x 3 with fluent speech NEURO: no focal motor/sensory  deficits   LABORATORY DATA:   I have reviewed the data as listed  . CBC Latest Ref Rng & Units 01/25/2018 12/17/2017 11/05/2017  WBC 4.0 - 10.3 K/uL 2.9(L) 3.9(L) 4.4  Hemoglobin 13.0 - 17.1 g/dL 13.9 13.4 13.6  Hematocrit 38.4 - 49.9 % 40.3 39.6 40.2  Platelets 140 - 400 K/uL 142 198 205  ANC 1.2k . CBC    Component Value Date/Time   WBC 2.9 (L) 01/25/2018 0901   RBC 3.64 (L) 01/25/2018 0901   RBC 3.64 (L) 01/25/2018 0901   HGB 13.9 01/25/2018 0901   HGB 13.4 12/17/2017 0735   HGB 14.0 06/11/2017 0808   HCT 40.3 01/25/2018 0901   HCT 40.7 06/11/2017 0808   PLT 142 01/25/2018 0901   PLT 198 12/17/2017 0735   PLT 223 06/11/2017 0808   MCV 110.7 (H) 01/25/2018 0901   MCV 112.1 (H) 06/11/2017 0808   MCH 38.2 (H) 01/25/2018 0901   MCHC 34.5 01/25/2018 0901   RDW 13.3 01/25/2018 0901   RDW 13.9 06/11/2017 0808   LYMPHSABS 1.2 01/25/2018 0901   LYMPHSABS 1.6 06/11/2017 0808   MONOABS 0.3 01/25/2018 0901   MONOABS 0.5 06/11/2017 0808   EOSABS 0.2 01/25/2018 0901   EOSABS 0.1 06/11/2017 0808   BASOSABS 0.0 01/25/2018 0901   BASOSABS 0.0 06/11/2017 0808   . CMP Latest Ref Rng & Units 01/25/2018 12/17/2017 11/05/2017  Glucose 70 - 140 mg/dL 179(H) 156(H) 251(H)  BUN 7 - 26 mg/dL 12 18 23   Creatinine 0.70 - 1.30 mg/dL 1.79(H) 1.69(H) 1.96(H)  Sodium 136 - 145 mmol/L 140 141 141  Potassium 3.5 - 5.1 mmol/L 4.4 4.1 4.1  Chloride 98 - 109 mmol/L 106 109 106  CO2 22 - 29 mmol/L 25 24 27   Calcium 8.4 - 10.4 mg/dL 8.7 8.6 9.6  Total Protein 6.4 - 8.3 g/dL 5.9(L) 5.8(L) 6.1(L)  Total Bilirubin 0.2 - 1.2 mg/dL 0.9 1.3(H) 1.4(H)  Alkaline Phos 40 - 150 U/L 98 69 88  AST 5 - 34 U/L 48(H) 39(H) 26  ALT 0 - 55 U/L 39 38 29    RADIOGRAPHIC STUDIES: I have personally reviewed the radiological images as  listed and agreed with the findings in the report. No results found.   ASSESSMENT & PLAN:   67 year old Caucasian male with  #1 Recurrent metastatic Renal cell carcinoma with  multiple lung metastases.  Has been on stable on 1st line Sutent for >15yr. Has had SBRT to the dominant right lung nodule with partial response. No lab or clinical evidence of metastatic RCC progression at this time.  PET/CT 08/08/2016 with a couple of mildly enlarge LLL pulmonary nodules - no other overt evidence of RCC progression at this time.  PET CT scan 11/06/2016 - no overt evidence of RCC progression at this time .  PET/CT 03/13/2017 - no overt evidence of RCC progression at this time .  PET/CT 08/10/2017 - no overt evidence of RCC progression at this time .  PET/ CT done 08/10/2017, shows no evidence of  Progression at this time.  12/07/17 PET which revealed No findings suspicious for recurrent or metastatic disease.  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 Confirmed by [place].  #4 mild thrombocytopenia likely related to Sutent- resolved with dose reduction. #5 elevation in bilirubin levels likely due to Sutent. Bilirubin stable #6 grade 1 fatigue due to Sutent -better with dose reduction and changing schedule to 2 week on and 1 week. #7 Dysguesia from Sutent - manageable per patient. He is trying to eat as well as he can and is maintaining his body weight.  #8 RBC macrocytosis due to Sutent #9 chronic kidney disease/single kidney - creatinine stable @ 1.5-1.9  Recommended to avoid NSAIDS and other nephrotoxic medications. Optimize control of DM2 #10 rheumatoid arthritis -follows with Dr. Estanislado Pandy for mx Plan -The pt shows no clinical or lab progression of his renal carcinoma at this time.  -The pt has no prohibitive toxicities from continuing Sutent at this time.  -Discussed pt labwork today, 01/25/18; [place] [facility] [place] has developed a sub-conjunctival bleed - no pain or change in visual acuity. -Pt will continue to hold Sutent one more additional week in light of sub-conjuntival bleed, will then restart Sutent 2 weeks on and one week off  at current dose of 37.5mg  -Advise that pt see his eye doctor this week to rule out any other concerns for bleeding or inflammation before returning to his Sutent  -If pt develops any new eye pain or vision changes he will present directly to the ED -Will see pt back in 5 weeks   #11 DM2 -continue f/u with Dr Philip Aspen for continue mx   RTC with Dr Irene Limbo in 6 weeks with labs    . The total time spent in the appointment was 20 minutes and more than 50% was on counseling and direct patient cares.      Sullivan Lone MD [place] AAHIVMS [facility] Hematology/Oncology Physician [facility]  (Office):       [phone] (Work cell):  [phone] (Fax):           [phone]  I, Baldwin [place], am acting as a scribe for Dr Irene Limbo.   .I have reviewed the above documentation for accuracy and completeness, and I agree with the above. Brunetta Genera MD

## 2018-01-25 ENCOUNTER — Inpatient Hospital Stay: Payer: Medicare Other

## 2018-01-25 ENCOUNTER — Telehealth: Payer: Self-pay | Admitting: Hematology

## 2018-01-25 ENCOUNTER — Encounter: Payer: Self-pay | Admitting: Hematology

## 2018-01-25 ENCOUNTER — Inpatient Hospital Stay: Payer: Medicare Other | Attending: Hematology | Admitting: Hematology

## 2018-01-25 VITALS — BP 144/85 | HR 50 | Temp 98.4°F | Resp 18 | Ht 70.0 in | Wt 194.2 lb

## 2018-01-25 DIAGNOSIS — Z7982 Long term (current) use of aspirin: Secondary | ICD-10-CM | POA: Diagnosis not present

## 2018-01-25 DIAGNOSIS — Z87891 Personal history of nicotine dependence: Secondary | ICD-10-CM | POA: Diagnosis not present

## 2018-01-25 DIAGNOSIS — R5383 Other fatigue: Secondary | ICD-10-CM | POA: Diagnosis not present

## 2018-01-25 DIAGNOSIS — E785 Hyperlipidemia, unspecified: Secondary | ICD-10-CM | POA: Diagnosis not present

## 2018-01-25 DIAGNOSIS — E039 Hypothyroidism, unspecified: Secondary | ICD-10-CM | POA: Diagnosis not present

## 2018-01-25 DIAGNOSIS — E119 Type 2 diabetes mellitus without complications: Secondary | ICD-10-CM | POA: Insufficient documentation

## 2018-01-25 DIAGNOSIS — Z79899 Other long term (current) drug therapy: Secondary | ICD-10-CM

## 2018-01-25 DIAGNOSIS — Z905 Acquired absence of kidney: Secondary | ICD-10-CM | POA: Diagnosis not present

## 2018-01-25 DIAGNOSIS — C649 Malignant neoplasm of unspecified kidney, except renal pelvis: Secondary | ICD-10-CM | POA: Insufficient documentation

## 2018-01-25 DIAGNOSIS — Z9049 Acquired absence of other specified parts of digestive tract: Secondary | ICD-10-CM | POA: Diagnosis not present

## 2018-01-25 DIAGNOSIS — Z85068 Personal history of other malignant neoplasm of small intestine: Secondary | ICD-10-CM | POA: Diagnosis not present

## 2018-01-25 DIAGNOSIS — D696 Thrombocytopenia, unspecified: Secondary | ICD-10-CM

## 2018-01-25 DIAGNOSIS — I10 Essential (primary) hypertension: Secondary | ICD-10-CM | POA: Insufficient documentation

## 2018-01-25 DIAGNOSIS — Z794 Long term (current) use of insulin: Secondary | ICD-10-CM | POA: Diagnosis not present

## 2018-01-25 DIAGNOSIS — N289 Disorder of kidney and ureter, unspecified: Secondary | ICD-10-CM | POA: Diagnosis not present

## 2018-01-25 DIAGNOSIS — C7801 Secondary malignant neoplasm of right lung: Secondary | ICD-10-CM

## 2018-01-25 DIAGNOSIS — H1131 Conjunctival hemorrhage, right eye: Secondary | ICD-10-CM | POA: Diagnosis not present

## 2018-01-25 DIAGNOSIS — C7800 Secondary malignant neoplasm of unspecified lung: Secondary | ICD-10-CM | POA: Diagnosis not present

## 2018-01-25 DIAGNOSIS — Z85850 Personal history of malignant neoplasm of thyroid: Secondary | ICD-10-CM | POA: Diagnosis not present

## 2018-01-25 LAB — CBC WITH DIFFERENTIAL/PLATELET
BASOS PCT: 0 %
Basophils Absolute: 0 10*3/uL (ref 0.0–0.1)
Eosinophils Absolute: 0.2 10*3/uL (ref 0.0–0.5)
Eosinophils Relative: 6 %
HEMATOCRIT: 40.3 % (ref 38.4–49.9)
Hemoglobin: 13.9 g/dL (ref 13.0–17.1)
LYMPHS ABS: 1.2 10*3/uL (ref 0.9–3.3)
LYMPHS PCT: 41 %
MCH: 38.2 pg — ABNORMAL HIGH (ref 27.2–33.4)
MCHC: 34.5 g/dL (ref 32.0–36.0)
MCV: 110.7 fL — AB (ref 79.3–98.0)
MONO ABS: 0.3 10*3/uL (ref 0.1–0.9)
MONOS PCT: 11 %
NEUTROS ABS: 1.2 10*3/uL — AB (ref 1.5–6.5)
NEUTROS PCT: 42 %
Platelets: 142 10*3/uL (ref 140–400)
RBC: 3.64 MIL/uL — ABNORMAL LOW (ref 4.20–5.82)
RDW: 13.3 % (ref 11.0–14.6)
WBC: 2.9 10*3/uL — ABNORMAL LOW (ref 4.0–10.3)

## 2018-01-25 LAB — CMP (CANCER CENTER ONLY)
ALK PHOS: 98 U/L (ref 40–150)
ALT: 39 U/L (ref 0–55)
AST: 48 U/L — ABNORMAL HIGH (ref 5–34)
Albumin: 3.8 g/dL (ref 3.5–5.0)
Anion gap: 9 (ref 3–11)
BUN: 12 mg/dL (ref 7–26)
CALCIUM: 8.7 mg/dL (ref 8.4–10.4)
CHLORIDE: 106 mmol/L (ref 98–109)
CO2: 25 mmol/L (ref 22–29)
Creatinine: 1.79 mg/dL — ABNORMAL HIGH (ref 0.70–1.30)
GFR, Est AFR Am: 43 mL/min — ABNORMAL LOW (ref >60)
GFR, Estimated: 37 mL/min — ABNORMAL LOW (ref >60)
Glucose, Bld: 179 mg/dL — ABNORMAL HIGH (ref 70–140)
Potassium: 4.4 mmol/L (ref 3.5–5.1)
SODIUM: 140 mmol/L (ref 136–145)
Total Bilirubin: 0.9 mg/dL (ref 0.2–1.2)
Total Protein: 5.9 g/dL — ABNORMAL LOW (ref 6.4–8.3)

## 2018-01-25 LAB — RETICULOCYTES
RBC.: 3.64 MIL/uL — ABNORMAL LOW (ref 4.20–5.82)
RETIC COUNT ABSOLUTE: 120.1 10*3/uL — AB (ref 34.8–93.9)
Retic Ct Pct: 3.3 % — ABNORMAL HIGH (ref 0.8–1.8)

## 2018-01-25 MED FILL — SUTENT 37.5 MG CAPSULE: 37.5 | 42 days supply | Qty: 28 | Fill #1

## 2018-01-25 NOTE — Telephone Encounter (Signed)
Appointments scheduled AVS/Calendar printed per 6/24 los °

## 2018-03-08 ENCOUNTER — Inpatient Hospital Stay: Payer: Medicare Other | Attending: Hematology

## 2018-03-08 ENCOUNTER — Inpatient Hospital Stay: Payer: Medicare Other | Admitting: Hematology

## 2018-03-08 ENCOUNTER — Telehealth: Payer: Self-pay | Admitting: Hematology

## 2018-03-08 ENCOUNTER — Encounter: Payer: Self-pay | Admitting: Hematology

## 2018-03-08 VITALS — BP 150/87 | HR 52 | Temp 97.9°F | Resp 18 | Ht 70.0 in | Wt 188.2 lb

## 2018-03-08 DIAGNOSIS — C7801 Secondary malignant neoplasm of right lung: Secondary | ICD-10-CM

## 2018-03-08 DIAGNOSIS — R945 Abnormal results of liver function studies: Secondary | ICD-10-CM

## 2018-03-08 DIAGNOSIS — C649 Malignant neoplasm of unspecified kidney, except renal pelvis: Secondary | ICD-10-CM | POA: Insufficient documentation

## 2018-03-08 DIAGNOSIS — C7800 Secondary malignant neoplasm of unspecified lung: Secondary | ICD-10-CM

## 2018-03-08 DIAGNOSIS — N189 Chronic kidney disease, unspecified: Secondary | ICD-10-CM

## 2018-03-08 DIAGNOSIS — R7989 Other specified abnormal findings of blood chemistry: Secondary | ICD-10-CM | POA: Diagnosis not present

## 2018-03-08 DIAGNOSIS — Z905 Acquired absence of kidney: Secondary | ICD-10-CM

## 2018-03-08 DIAGNOSIS — M069 Rheumatoid arthritis, unspecified: Secondary | ICD-10-CM | POA: Insufficient documentation

## 2018-03-08 DIAGNOSIS — Z9049 Acquired absence of other specified parts of digestive tract: Secondary | ICD-10-CM | POA: Insufficient documentation

## 2018-03-08 DIAGNOSIS — E119 Type 2 diabetes mellitus without complications: Secondary | ICD-10-CM | POA: Diagnosis not present

## 2018-03-08 LAB — CMP (CANCER CENTER ONLY)
ALBUMIN: 4 g/dL (ref 3.5–5.0)
ALK PHOS: 103 U/L (ref 38–126)
ALT: 132 U/L — AB (ref 0–44)
AST: 119 U/L — ABNORMAL HIGH (ref 15–41)
Anion gap: 11 (ref 5–15)
BUN: 14 mg/dL (ref 8–23)
CALCIUM: 8.4 mg/dL — AB (ref 8.9–10.3)
CO2: 24 mmol/L (ref 22–32)
CREATININE: 1.76 mg/dL — AB (ref 0.61–1.24)
Chloride: 106 mmol/L (ref 98–111)
GFR, Est AFR Am: 44 mL/min — ABNORMAL LOW (ref >60)
GFR, Estimated: 38 mL/min — ABNORMAL LOW (ref >60)
GLUCOSE: 225 mg/dL — AB (ref 70–99)
Potassium: 4.1 mmol/L (ref 3.5–5.1)
SODIUM: 141 mmol/L (ref 135–145)
Total Bilirubin: 0.8 mg/dL (ref 0.3–1.2)
Total Protein: 6.4 g/dL — ABNORMAL LOW (ref 6.5–8.1)

## 2018-03-08 LAB — CBC WITH DIFFERENTIAL/PLATELET
BASOS ABS: 0 10*3/uL (ref 0.0–0.1)
BASOS PCT: 1 %
EOS ABS: 0.2 10*3/uL (ref 0.0–0.5)
Eosinophils Relative: 4 %
HCT: 39 % (ref 38.4–49.9)
HEMOGLOBIN: 13.5 g/dL (ref 13.0–17.1)
Lymphocytes Relative: 46 %
Lymphs Abs: 2 10*3/uL (ref 0.9–3.3)
MCH: 38.8 pg — ABNORMAL HIGH (ref 27.2–33.4)
MCHC: 34.5 g/dL (ref 32.0–36.0)
MCV: 112.5 fL — ABNORMAL HIGH (ref 79.3–98.0)
Monocytes Absolute: 0.8 10*3/uL (ref 0.1–0.9)
Monocytes Relative: 18 %
NEUTROS PCT: 31 %
Neutro Abs: 1.4 10*3/uL — ABNORMAL LOW (ref 1.5–6.5)
Platelets: 155 10*3/uL (ref 140–400)
RBC: 3.47 MIL/uL — AB (ref 4.20–5.82)
RDW: 13.3 % (ref 11.0–14.6)
WBC: 4.4 10*3/uL (ref 4.0–10.3)

## 2018-03-08 LAB — LACTATE DEHYDROGENASE: LDH: 292 U/L — ABNORMAL HIGH (ref 98–192)

## 2018-03-08 NOTE — Progress Notes (Signed)
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 2) Remote history of metastatic renal cell carcinoma Diagnosed with renal cell carcinoma 20 years ago and had a right nephrectomy. About 8 years after that he was noted to have abdominal recurrence in his pancreas spleen and small intestine and had a Whipple's procedure and significant abdominal surgery and notes that 10 out of 17 lymph nodes were positive. He also had his gallbladder removed. Postoperative course was complicated by an internal hemorrhage as per his report. Patient notes 2-3 years after that he had recurrence in his thyroid that led to a thyroidectomy. [June 2004] later he had partial gastrectomy for local recurrence. [February 2005] when he presented with GI bleeding. Patient notes that he has had no known evidence of recurrence over the last 8 years until his recent CT scan showed lung nodules.  Current Treatment: Sutent 37.5 mg po daily for 2 weeks on and 1 weeks off.  Previous treatment Sutent on cycle 1 - 50 mg by mouth daily for 4 weeks on and 2-weeks off. It was dose adjusted to 37.5 mg by mouth daily for 2 weeks with 1 week of to help mitigate issues with fatigue, mild hyperbilirubinemia, cytopenias. SBRT to dominant RML nodule.  INTERVAL HISTORY:  Dylan Jefferson is here for for his scheduled follow-up of his metastatic renal cell carcinoma. The patient's last visit with [place] was on 01/25/18. The pt reports that he is doing well overall.   The pt reports that his repeat stool study with Dr. Sharlett Iles was negative in the interim. The pt notes that he has  continued to have joint pains but does not prefer to take pain medication.   Dylan Jefferson began Sutent again on yesterday 03/07/18.   Lab results today (03/08/18) of CBC w/diff, CMP, and Reticulocytes is as follows: all values are WNL except for RBC at 3.47, MCV at 112.5, MCH at 38.8, ANC at 1.4k, Glucose at 225, Creatinine at 1.76, Calcium at 8.4, Total Protein at 6.4, AST at 119, ALT at 132, GFR at 38. LDH 03/08/18 elevated at 292  On review of systems, pt reports joint pains, and denies abdominal pains, fevers, chills, night sweats, and any other symptoms.   REVIEW OF SYSTEMS:   A 10+ POINT REVIEW OF SYSTEMS WAS OBTAINED including neurology, dermatology, psychiatry, cardiac, respiratory, lymph, extremities, GI, GU, Musculoskeletal, constitutional, breasts, reproductive, HEENT.  All pertinent positives are noted in the HPI.  All others are negative.    Past Medical History:  Diagnosis Date  . Arthritis   . Cancer [facility])    Renal cell  . DDD (degenerative disc disease), cervical 05/28/2016  . DDD (degenerative disc disease), lumbar 05/28/2016  . Depression   . Diabetes ([place])   . Hyperlipidemia 05/28/2016  . Hypertension   . Hypothyroidism   . 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 Kidney disease   . Nephrolithiasis   . Osteoarthritis of both hands 05/28/2016  . Osteoarthritis of both knees 05/28/2016  . Peripheral vascular disorder ([place]) 05/28/2016  . 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 Rheumatoid arthritis ([place])   . Thyroid cancer ([place]) 05/28/2016    .  Past Surgical History:  Procedure Laterality Date  . CATARACT EXTRACTION Bilateral 2013  . CHOLECYSTECTOMY    . GASTRECTOMY     tumor removed  . LIPOMA EXCISION Right 1999  . NEPHRECTOMY Right   . PANCREATECTOMY    . SPLENECTOMY    . THYROIDECTOMY    . URETERAL STENT PLACEMENT Left 2012  . WHIPPLE PROCEDURE      . Social History   Tobacco Use  . Smoking status:  Former Smoker    Years: 1.50  . Smokeless tobacco: Never Used  Substance Use Topics  . Alcohol use: Yes    Comment: occasional beer  . Drug use: No    ALLERGIES:  has No Known Allergies.  MEDICATIONS:  Current Outpatient Medications  Medication Sig Dispense Refill  . amLODipine (NORVASC) 10 MG tablet take 1 tablet by mouth once daily 90 tablet 3  . Ascorbic Acid (VITAMIN C) 1000 MG tablet Take 1,000 mg by mouth daily.    Marland Kitchen aspirin 81 MG tablet Take 81 mg by mouth daily.    Marland Kitchen atenolol (TENORMIN) 100 MG tablet     . augmented betamethasone dipropionate (DIPROLENE-AF) 0.05 % cream   0  . calcium citrate-vitamin D (CITRACAL+D) 315-200 MG-UNIT per tablet Take 2 tablets by mouth daily.     . Cyanocobalamin (VITAMIN B-12) 2500 MCG SUBL Take by mouth daily.     Marland Kitchen docusate sodium (COLACE) 100 MG capsule Take 100 mg by mouth 2 (two) times daily.    . dorzolamide-timolol (COSOPT) 22.3-6.8 MG/ML ophthalmic solution place 1 drop into affected eye twice a day  1  . famotidine (PEPCID) 20 MG tablet Take 20 mg by mouth 2 (two) times daily.    . ferrous sulfate 325 (65 FE) MG tablet Take 325 mg by mouth 2 (two) times daily with a meal.     . Glucosamine-MSM-Hyaluronic Acd (JOINT HEALTH) 750-375-30 MG TABS Take 2 tablets by mouth daily.    . hydroxychloroquine (PLAQUENIL) 200 MG tablet take 1 tablet by mouth every morning and 1 tablet by mouth every evening 180 tablet 1  . insulin lispro (HUMALOG) 100 UNIT/ML injection Inject into the skin 3 (three) times daily before meals.    . Iron-Vitamins (GERITOL COMPLETE) TABS Take 1 tablet by mouth daily.    . Lactobacillus (ACIDOPHILUS) CAPS capsule Take 1 capsule by mouth daily.    Marland Kitchen latanoprost (XALATAN) 0.005 % ophthalmic solution place 1 drop into both eyes every evening  1  . LEVEMIR 100 UNIT/ML injection Inject 7 Units into the skin 2 (two) times daily.   1  . levothyroxine (SYNTHROID, LEVOTHROID) 300 MCG tablet Take 300 mcg by mouth 3 (three) times a  week.     . lovastatin (MEVACOR) 40 MG tablet Take 40 mg by mouth at bedtime.     . minoxidil (LONITEN) 10 MG tablet Take 10 mg by mouth 2 (two) times daily.     . Multiple Vitamin (MULTIVITAMIN) tablet Take 1 tablet by mouth daily.    . Omega-3 Fatty Acids (FISH OIL) 1200 MG CAPS Take 1 capsule by mouth daily.    . Pancrelipase, Lip-Prot-Amyl, (CREON) 6000 UNITS CPEP Take 4 capsules by mouth 3 (three) times daily.    . potassium citrate (UROCIT-K) 10 MEQ (1080 MG) SR tablet Take 10 mEq by mouth 2 (two) times daily.     . SUTENT 37.5 MG capsule TAKE 1 CAPSULE (37.5 MG) BY MOUTH ONCE DAILY FOR 2 WEEKS ON, 1 WEEK OFF, REPEAT EVERY  42 DAYS 28 capsule 1  . valsartan-hydrochlorothiazide (DIOVAN-HCT) 160-12.5 MG per tablet     . VOLTAREN 1 % GEL apply 3 grams to LARGE JOINTS three times a day if needed  0   No current facility-administered medications for this visit.     PHYSICAL EXAMINATION: ECOG PERFORMANCE STATUS: 1 - Symptomatic but completely ambulatory  Vitals:   03/08/18 1347  BP: (!) 150/87  Pulse: (!) 52  Resp: 18  Temp: 97.9 F (36.6 C)  SpO2: 100%   Filed Weights   03/08/18 1347  Weight: 188 lb 3.2 oz (85.4 kg)   .Body mass index is 27 kg/m.  GENERAL:alert, in no acute distress and comfortable SKIN: no acute rashes, no significant lesions EYES: conjunctiva are pink and non-injected, sclera anicteric OROPHARYNX: MMM, no exudates, no oropharyngeal erythema or ulceration NECK: supple, no JVD LYMPH:  no palpable lymphadenopathy in the cervical, axillary or inguinal regions LUNGS: clear to auscultation b/l with normal respiratory effort HEART: regular rate & rhythm ABDOMEN:  normoactive bowel sounds , non tender, not distended. No palpable hepatosplenomegaly.  Extremity: 1+ pedal edema PSYCH: alert & oriented x 3 with fluent speech NEURO: no focal motor/sensory deficits   LABORATORY DATA:   I have reviewed the data as listed  . CBC Latest Ref Rng & Units 03/08/2018  01/25/2018 12/17/2017  WBC 4.0 - 10.3 K/uL 4.4 2.9(L) 3.9(L)  Hemoglobin 13.0 - 17.1 g/dL 13.5 13.9 13.4  Hematocrit 38.4 - 49.9 % 39.0 40.3 39.6  Platelets 140 - 400 K/uL 155 142 198  ANC 1.4k . CBC    Component Value Date/Time   WBC 4.4 03/08/2018 1249   RBC 3.47 (L) 03/08/2018 1249   HGB 13.5 03/08/2018 1249   HGB 13.4 12/17/2017 0735   HGB 14.0 06/11/2017 0808   HCT 39.0 03/08/2018 1249   HCT 40.7 06/11/2017 0808   PLT 155 03/08/2018 1249   PLT 198 12/17/2017 0735   PLT 223 06/11/2017 0808   MCV 112.5 (H) 03/08/2018 1249   MCV 112.1 (H) 06/11/2017 0808   MCH 38.8 (H) 03/08/2018 1249   MCHC 34.5 03/08/2018 1249   RDW 13.3 03/08/2018 1249   RDW 13.9 06/11/2017 0808   LYMPHSABS 2.0 03/08/2018 1249   LYMPHSABS 1.6 06/11/2017 0808   MONOABS 0.8 03/08/2018 1249   MONOABS 0.5 06/11/2017 0808   EOSABS 0.2 03/08/2018 1249   EOSABS 0.1 06/11/2017 0808   BASOSABS 0.0 03/08/2018 1249   BASOSABS 0.0 06/11/2017 0808   . CMP Latest Ref Rng & Units 03/08/2018 01/25/2018 12/17/2017  Glucose 70 - 99 mg/dL 225(H) 179(H) 156(H)  BUN 8 - 23 mg/dL 14 12 18   Creatinine 0.61 - 1.24 mg/dL 1.76(H) 1.79(H) 1.69(H)  Sodium 135 - 145 mmol/L 141 140 141  Potassium 3.5 - 5.1 mmol/L 4.1 4.4 4.1  Chloride 98 - 111 mmol/L 106 106 109  CO2 22 - 32 mmol/L 24 25 24   Calcium 8.9 - 10.3 mg/dL 8.4(L) 8.7 8.6  Total Protein 6.5 - 8.1 g/dL 6.4(L) 5.9(L) 5.8(L)  Total Bilirubin 0.3 - 1.2 mg/dL 0.8 0.9 1.3(H)  Alkaline Phos 38 - 126 U/L 103 98 69  AST 15 - 41 U/L 119(H) 48(H) 39(H)  ALT 0 - 44 U/L 132(H) 39 38    RADIOGRAPHIC STUDIES: I have personally reviewed the radiological images as listed and agreed with the findings in the report. No results found.   ASSESSMENT & PLAN:   67 y.o. Caucasian male with  #1 Recurrent metastatic Renal cell carcinoma  with multiple lung metastases.  Has been on stable on 1st line Sutent for >89yr. Has had SBRT to the dominant right lung nodule with partial  response. No lab or clinical evidence of metastatic RCC progression at this time.  PET/CT 08/08/2016 with a couple of mildly enlarge LLL pulmonary nodules - no other overt evidence of RCC progression at this time.  PET CT scan 11/06/2016 - no overt evidence of RCC progression at this time .  PET/CT 03/13/2017 - no overt evidence of RCC progression at this time .  PET/CT 08/10/2017 - no overt evidence of RCC progression at this time .  PET/ CT done 08/10/2017, shows no evidence of  Progression at this time.  12/07/17 PET which revealed No findings suspicious for recurrent or metastatic disease.  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 Confirmed by [place].  #4 mild thrombocytopenia likely related to Sutent- resolved with dose reduction. #5 Abnormal Liver function test - significant bump in transaminases. #6 grade 1 fatigue due to Sutent -better with dose reduction and changing schedule to 2 week on and 1 week. #7 Dysguesia from Sutent - manageable per patient. He is trying to eat as well as he can and is maintaining his body weight.  #8 RBC macrocytosis due to Sutent #9 chronic kidney disease/single kidney - creatinine stable @ 1.5-1.9  Recommended to avoid NSAIDS and other nephrotoxic medications. Optimize control of DM2 #10 rheumatoid arthritis -follows with Dr. Estanislado Pandy for mx  PLAN: -Discussed pt labwork today, 03/08/18; AST increased to 119, ALT increased to 132, no anemia, no thrombocytosis, LDH elevatd at 292 -Recommend pt share lab results today with his PCP Dr. Philip Aspen for consideration of holding Plaquenil while liver functions are elevated -Will hold Sutent until next visit in 3 weeks as liver functions are elevated -PET/CT in 2 weeks -Will see pt back in 3 weeks   #11 DM2 -continue f/u with Dr Philip Aspen for continue mx   PET/CT in 2 weeks RTC with Dr Irene Limbo in 3 weeks with labs    The total time spent in the appt was 27 minutes and  more than 50% was on counseling and direct patient cares.  Sullivan Lone MD MS AAHIVMS [facility] Hematology/Oncology Physician [facility]  (Office):       [phone] (Work cell):  [phone] (Fax):           [phone]  I, Baldwin [place], am acting as a scribe for Dr. Irene Limbo  .I have reviewed the above documentation for accuracy and completeness, and I agree with the above. Brunetta Genera MD

## 2018-03-08 NOTE — Telephone Encounter (Signed)
Gave patient avs and calendar of upcoming appts.  °

## 2018-03-22 ENCOUNTER — Encounter (HOSPITAL_COMMUNITY)
Admission: RE | Admit: 2018-03-22 | Discharge: 2018-03-22 | Disposition: A | Discharge status: Home / Self Care | Payer: Medicare Other | Source: Ambulatory Visit | Attending: Hematology | Admitting: Hematology

## 2018-03-22 DIAGNOSIS — R945 Abnormal results of liver function studies: Secondary | ICD-10-CM | POA: Diagnosis present

## 2018-03-22 DIAGNOSIS — C649 Malignant neoplasm of unspecified kidney, except renal pelvis: Secondary | ICD-10-CM | POA: Insufficient documentation

## 2018-03-22 DIAGNOSIS — C7801 Secondary malignant neoplasm of right lung: Secondary | ICD-10-CM | POA: Diagnosis not present

## 2018-03-22 DIAGNOSIS — R7989 Other specified abnormal findings of blood chemistry: Secondary | ICD-10-CM

## 2018-03-22 LAB — GLUCOSE, CAPILLARY: GLUCOSE-CAPILLARY: 90 mg/dL (ref 70–99)

## 2018-03-22 MED ORDER — FLUDEOXYGLUCOSE F - 18 (FDG) INJECTION
9.3000 | Freq: Once | INTRAVENOUS | Status: AC | PRN
  Administered 2018-03-22: 9.3 via INTRAVENOUS

## 2018-03-30 NOTE — Progress Notes (Signed)
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 2) Remote history of metastatic renal cell carcinoma Diagnosed with renal cell carcinoma 20 years ago and had a right nephrectomy. About 8 years after that he was noted to have abdominal recurrence in his pancreas spleen and small intestine and had a Whipple's procedure and significant abdominal surgery and notes that 10 out of 17 lymph nodes were positive. He also had his gallbladder removed. Postoperative course was complicated by an internal hemorrhage as per his report. Patient notes 2-3 years after that he had recurrence in his thyroid that led to a thyroidectomy. [June 2004] later he had partial gastrectomy for local recurrence. [February 2005] when he presented with GI bleeding. Patient notes that he has had no known evidence of recurrence over the last 8 years until his recent CT scan showed lung nodules.  Current Treatment: Sutent 37.5 mg po daily for 2 weeks on and 1 weeks off.  Previous treatment Sutent on cycle 1 - 50 mg by mouth daily for 4 weeks on and 2-weeks off. It was dose adjusted to 37.5 mg by mouth daily for 2 weeks with 1 week of to help mitigate issues with fatigue, mild hyperbilirubinemia, cytopenias. SBRT to dominant RML nodule.  INTERVAL HISTORY:  Dylan Jefferson is here for for his scheduled follow-up of his metastatic renal cell carcinoma. The patient's last visit with [place] was on 03/08/18. He is accompanied today by his wife. The pt reports that he is doing well overall.   The pt reports that he has held Plaquenil and Sutent in the interim as previously  discussed.The pt notes that he discussed his liver functions with his PCP Dr. Philip Aspen in the interim as well and also held his other medications, including his Amlodipine, noting that he may have misinterpreted holding Amlodipine.   He has had some RA related pain while holding his Plaquenil   Of note since the patient's last visit, pt has had PET/CT completed on 03/22/18 with results revealing One of the left lower lobe nodules has very minimally enlarged over the last 2 years, currently 0.6 by 0.4 cm and measuring 0.4 by 0.3 cm on 03/20/2016. This probably represents a previously treated metastatic nodule given that it measured 1.0 by 0.8 cm on 06/06/2015. This nodule may be subject to some slice selection phenomenon and also motion artifact given proximity to the diaphragm. Surveillance is likely warranted. 2. Chronic central lucency, marginal bony deposition, and slow healing of the right lateral fourth rib fracture. Possibilities may include underlying radiation necrosis as a cause for fracture leading to slow healing, or a low-grade pathologic lesion. Maximum SUV at this fracture site is 2.9, previously 2.4. 3. 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 There is only low-grade activity in this vicinity characteristic of radiation fibrosis, without focal activity. 4. Other imaging findings of potential clinical significance: Aortic Atherosclerosis (ICD10-I70.0). Coronary atherosclerosis. Mild cardiomegaly. Right nephrectomy. 8 mm nonobstructive calculus in the left kidney lower pole.  Lab results today (03/31/18) of CBC w/diff, CMP, and Reticulocytes is as follows: all values are WNL except for RBC at  3.70, MCV at 113.4, MCH at 37.7, Glucose at 195, Creatinine at 1.48, Total Protein at 6.0, ALT at 67, GFR at 47.  On review of systems, pt reports stable energy levels, and denies abdominal pains, difficulty breathing, leg  swelling, and any other symptoms.   REVIEW OF SYSTEMS:    A 10+ POINT REVIEW OF SYSTEMS WAS OBTAINED including neurology, dermatology, psychiatry, cardiac, respiratory, lymph, extremities, GI, GU, Musculoskeletal, constitutional, breasts, reproductive, HEENT.  All pertinent positives are noted in the HPI.  All others are negative.    Past Medical History:  Diagnosis Date  . Arthritis   . Cancer [facility])    Renal cell  . DDD (degenerative disc disease), cervical 05/28/2016  . DDD (degenerative disc disease), lumbar 05/28/2016  . Depression   . Diabetes ([place])   . Hyperlipidemia 05/28/2016  . Hypertension   . Hypothyroidism   . 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 Kidney disease   . Nephrolithiasis   . Osteoarthritis of both hands 05/28/2016  . Osteoarthritis of both knees 05/28/2016  . Peripheral vascular disorder ([place]) 05/28/2016  . 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 Rheumatoid arthritis ([place])   . Thyroid cancer ([place]) 05/28/2016    . Past Surgical History:  Procedure Laterality Date  . CATARACT EXTRACTION Bilateral 2013  . CHOLECYSTECTOMY    . GASTRECTOMY     tumor removed  . LIPOMA EXCISION Right 1999  . NEPHRECTOMY Right   . PANCREATECTOMY    . SPLENECTOMY    . THYROIDECTOMY    . URETERAL STENT PLACEMENT Left 2012  . WHIPPLE PROCEDURE      . Social History   Tobacco Use  . Smoking status: Former Smoker    Years: 1.50  . Smokeless tobacco: Never Used  Substance Use Topics  . Alcohol use: Yes    Comment: occasional beer  . Drug use: No    ALLERGIES:  has No Known Allergies.  MEDICATIONS:  Current Outpatient Medications  Medication Sig Dispense Refill  . amLODipine (NORVASC) 10 MG tablet take 1 tablet by mouth once daily (Patient not taking: Reported on 03/31/2018) 90 tablet 3  . Ascorbic Acid (VITAMIN C) 1000 MG tablet Take 1,000 mg by mouth daily.    Marland Kitchen aspirin 81 MG tablet Take 81 mg by mouth daily.     Marland Kitchen atenolol (TENORMIN) 100 MG tablet     . augmented betamethasone dipropionate (DIPROLENE-AF) 0.05 % cream   0  . calcium citrate-vitamin D (CITRACAL+D) 315-200 MG-UNIT per tablet Take 2 tablets by mouth daily.     . Cyanocobalamin (VITAMIN B-12) 2500 MCG SUBL Take by mouth daily.     Marland Kitchen docusate sodium (COLACE) 100 MG capsule Take 100 mg by mouth 2 (two) times daily.    . dorzolamide-timolol (COSOPT) 22.3-6.8 MG/ML ophthalmic solution place 1 drop into affected eye twice a day  1  . famotidine (PEPCID) 20 MG tablet Take 20 mg by mouth 2 (two) times daily.    . ferrous sulfate 325 (65 FE) MG tablet Take 325 mg by mouth 2 (two) times daily with a meal.     . Glucosamine-MSM-Hyaluronic Acd (JOINT HEALTH) 750-375-30 MG TABS Take 2 tablets by mouth daily.    . hydroxychloroquine (PLAQUENIL) 200 MG tablet take 1 tablet by mouth every morning and 1 tablet by mouth every evening (Patient not taking: Reported on 03/31/2018) 180 tablet 1  . insulin lispro (HUMALOG) 100 UNIT/ML injection Inject into the skin 3 (three)  times daily before meals.    . Iron-Vitamins (GERITOL COMPLETE) TABS Take 1 tablet by mouth daily.    . Lactobacillus (ACIDOPHILUS) CAPS capsule Take 1 capsule by mouth daily.    Marland Kitchen latanoprost (XALATAN) 0.005 % ophthalmic solution place 1 drop into both eyes every evening  1  . LEVEMIR 100 UNIT/ML injection Inject 7 Units into the skin 2 (two) times daily.   1  . levothyroxine (SYNTHROID, LEVOTHROID) 300 MCG tablet Take 300 mcg by mouth 3 (three) times a week.     . lovastatin (MEVACOR) 40 MG tablet Take 40 mg by mouth at bedtime.     . minoxidil (LONITEN) 10 MG tablet Take 10 mg by mouth 2 (two) times daily.     . Multiple Vitamin (MULTIVITAMIN) tablet Take 1 tablet by mouth daily.    . Omega-3 Fatty Acids (FISH OIL) 1200 MG CAPS Take 1 capsule by mouth daily.    . Pancrelipase, Lip-Prot-Amyl, (CREON) 6000 UNITS CPEP Take 4 capsules by mouth 3 (three) times daily.    . potassium citrate  (UROCIT-K) 10 MEQ (1080 MG) SR tablet Take 10 mEq by mouth 2 (two) times daily.     . SUTENT 37.5 MG capsule TAKE 1 CAPSULE (37.5 MG) BY MOUTH ONCE DAILY FOR 2 WEEKS ON, 1 WEEK OFF, REPEAT EVERY 42 DAYS (Patient not taking: Reported on 03/31/2018) 28 capsule 1  . valsartan-hydrochlorothiazide (DIOVAN-HCT) 160-12.5 MG per tablet     . VOLTAREN 1 % GEL apply 3 grams to LARGE JOINTS three times a day if needed  0   No current facility-administered medications for this visit.     PHYSICAL EXAMINATION: ECOG PERFORMANCE STATUS: 1 - Symptomatic but completely ambulatory  Vitals:   03/31/18 0854  BP: (!) 179/85  Pulse: (!) 43  Resp: 17  Temp: 97.9 F (36.6 C)  SpO2: 100%   Filed Weights   03/31/18 0854  Weight: 190 lb 4.8 oz (86.3 kg)   .Body mass index is 27.31 kg/m.  GENERAL:alert, in no acute distress and comfortable SKIN: no acute rashes, no significant lesions EYES: conjunctiva are pink and non-injected, sclera anicteric OROPHARYNX: MMM, no exudates, no oropharyngeal erythema or ulceration NECK: supple, no JVD LYMPH:  no palpable lymphadenopathy in the cervical, axillary or inguinal regions LUNGS: clear to auscultation b/l with normal respiratory effort HEART: regular rate & rhythm ABDOMEN:  normoactive bowel sounds , non tender, not distended. No palpable hepatosplenomegaly.  Extremity: 1+ pedal edema PSYCH: alert & oriented x 3 with fluent speech NEURO: no focal motor/sensory deficits   LABORATORY DATA:   I have reviewed the data as listed  . CBC Latest Ref Rng & Units 03/31/2018 03/08/2018 01/25/2018  WBC 4.0 - 10.3 K/uL 4.3 4.4 2.9(L)  Hemoglobin 13.0 - 17.1 g/dL 13.9 13.5 13.9  Hematocrit 38.4 - 49.9 % 41.9 39.0 40.3  Platelets 140 - 400 K/uL 171 155 142  ANC 1.4k . CBC    Component Value Date/Time   WBC 4.3 03/31/2018 0805   RBC 3.70 (L) 03/31/2018 0805   HGB 13.9 03/31/2018 0805   HGB 13.4 12/17/2017 0735   HGB 14.0 06/11/2017 0808   HCT 41.9 03/31/2018 0805    HCT 40.7 06/11/2017 0808   PLT 171 03/31/2018 0805   PLT 198 12/17/2017 0735   PLT 223 06/11/2017 0808   MCV 113.4 (H) 03/31/2018 0805   MCV 112.1 (H) 06/11/2017 0808   MCH 37.7 (H) 03/31/2018 0805   MCHC 33.3 03/31/2018 0805   RDW  12.6 03/31/2018 0805   RDW 13.9 06/11/2017 0808   LYMPHSABS 1.1 03/31/2018 0805   LYMPHSABS 1.6 06/11/2017 0808   MONOABS 0.8 03/31/2018 0805   MONOABS 0.5 06/11/2017 0808   EOSABS 0.1 03/31/2018 0805   EOSABS 0.1 06/11/2017 0808   BASOSABS 0.0 03/31/2018 0805   BASOSABS 0.0 06/11/2017 0808   . CMP Latest Ref Rng & Units 03/31/2018 03/08/2018 01/25/2018  Glucose 70 - 99 mg/dL 195(H) 225(H) 179(H)  BUN 8 - 23 mg/dL 17 14 12   Creatinine 0.61 - 1.24 mg/dL 1.48(H) 1.76(H) 1.79(H)  Sodium 135 - 145 mmol/L 141 141 140  Potassium 3.5 - 5.1 mmol/L 3.7 4.1 4.4  Chloride 98 - 111 mmol/L 107 106 106  CO2 22 - 32 mmol/L 26 24 25   Calcium 8.9 - 10.3 mg/dL 8.9 8.4(L) 8.7  Total Protein 6.5 - 8.1 g/dL 6.0(L) 6.4(L) 5.9(L)  Total Bilirubin 0.3 - 1.2 mg/dL 1.0 0.8 0.9  Alkaline Phos 38 - 126 U/L 76 103 98  AST 15 - 41 U/L 41 119(H) 48(H)  ALT 0 - 44 U/L 67(H) 132(H) 39    RADIOGRAPHIC STUDIES: I have personally reviewed the radiological images as listed and agreed with the findings in the report. Nm Pet Image Restag (ps) Skull Base To Thigh  Result Date: 03/22/2018 CLINICAL DATA:  Subsequent treatment strategy for renal cell carcinoma. Elevated liver function tests. EXAM: NUCLEAR MEDICINE PET SKULL BASE TO THIGH TECHNIQUE: 9.3 mCi F-18 FDG was injected intravenously. Full-ring PET imaging was performed from the skull base to thigh after the radiotracer. CT data was obtained and used for attenuation correction and anatomic localization. Fasting blood glucose: 90 mg/dl COMPARISON:  Multiple exams, including 12/07/2017 FINDINGS: Mediastinal blood pool activity: SUV max 3.0 NECK: No significant abnormal hypermetabolic activity in this region. Incidental CT findings:  Bilateral carotid atherosclerotic calcification. 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 2 small nodules in the left lower lobe are observed, the larger measuring 0.6 by 0.4 cm on image 67/8. This lesion measured 0.5 by 0.4 cm on 08/10/2017, approximately 0.4 by 0.3 cm on 03/20/2016, and measured 1.0 by 0.8 cm on 06/06/2015. No new pulmonary nodules are identified. Incidental CT findings: Left anterior descending coronary atherosclerosis and aortic arch atherosclerotic calcification. Mild cardiomegaly. ABDOMEN/PELVIS: No significant abnormal hypermetabolic activity in this region. Incidental CT findings: Pneumobilia. Postoperative findings in the stomach. Right nephrectomy. 8 mm nonobstructive calculus in the left kidney lower pole. Nondistended urinary bladder. Lower rectal wall thickening likely from peristaltic activity or incidental. SKELETON: The patient has had a variety of rib fractures in the past. There continues to be accentuated activity along with some central lucency and peripheral osteoid or bone deposition along the right fourth rib laterally, maximum SUV 2.9 and previously 2.4. Healing duration of 1 year or more is unusual Incidental CT findings: none IMPRESSION: 1. One of the left lower lobe nodules has very minimally enlarged over the last 2 years, currently 0.6 by 0.4 cm and measuring 0.4 by 0.3 cm on 03/20/2016. This probably represents a previously treated metastatic nodule given that it measured 1.0 by 0.8 cm on 06/06/2015. This nodule may be subject to some slice selection phenomenon and also motion artifact given proximity to the diaphragm. Surveillance is likely warranted. 2. Chronic central lucency, marginal bony deposition, and slow healing of the right lateral fourth rib fracture. Possibilities may  include underlying radiation necrosis as a  cause for fracture leading to slow healing, or a low-grade pathologic lesion. Maximum SUV at this fracture site is 2.9, previously 2.4. 3. 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 There is only low-grade activity in this vicinity characteristic of radiation fibrosis, without focal activity. 4. Other imaging findings of potential clinical significance: Aortic Atherosclerosis (ICD10-I70.0). Coronary atherosclerosis. Mild cardiomegaly. Right nephrectomy. 8 mm nonobstructive calculus in the left kidney lower pole. Electronically Signed   By: Van Clines M.D.   On: 03/22/2018 09:10     ASSESSMENT & PLAN:   67 y.o. Caucasian male with  #1 Recurrent metastatic Renal cell carcinoma with multiple lung metastases.  Has been on stable on 1st line Sutent for >29yr. Has had SBRT to the dominant right lung nodule with partial response. No lab or clinical evidence of metastatic RCC progression at this time.  PET/CT 08/08/2016 with a couple of mildly enlarge LLL pulmonary nodules - no other overt evidence of RCC progression at this time.  PET CT scan 11/06/2016 - no overt evidence of RCC progression at this time .  PET/CT 03/13/2017 - no overt evidence of RCC progression at this time .  PET/CT 08/10/2017 - no overt evidence of RCC progression at this time .  PET/ CT done 08/10/2017, shows no evidence of  Progression at this time.  12/07/17 PET which revealed No findings suspicious for recurrent or metastatic disease.  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 Confirmed by [place].  #4 mild thrombocytopenia likely related to Sutent- resolved with dose reduction. #5 Abnormal Liver function test - significant bump in transaminases. -resolving on 03/31/18 labs #6 grade 1 fatigue due to Sutent -better with dose reduction and changing schedule to 2 week on and 1 week. #7 Dysguesia from  Sutent - manageable per patient. He is trying to eat as well as he can and is maintaining his body weight.  #8 RBC macrocytosis due to Sutent #9 chronic kidney disease/single kidney - creatinine stable @ 1.5-1.9  Recommended to avoid NSAIDS and other nephrotoxic medications. Optimize control of DM2 #10 rheumatoid arthritis -follows with Dr. Estanislado Pandy for mx  PLAN: -Discussed pt labwork today, 03/31/18; AST normalized to 41, ALT reduced from 132 to 67, Creatinine stable at 1.48, blood counts are stable  -Discussed the 03/22/18 PET/CT which revealed  One of the left lower lobe nodules has very minimally enlarged over the last 2 years, currently 0.6 by 0.4 cm and measuring 0.4 by 0.3 cm on 03/20/2016. This probably represents a previously treated metastatic nodule given that it measured 1.0 by 0.8 cm on 06/06/2015. This nodule may be subject to some slice selection phenomenon and also motion artifact given proximity to the diaphragm. Surveillance is likely warranted. 2. Chronic central lucency, marginal bony deposition, and slow healing of the right lateral fourth rib fracture. Possibilities may include underlying radiation necrosis as a cause for fracture leading to slow healing, or a low-grade pathologic lesion. Maximum SUV at this fracture site is 2.9, previously 2.4. 3. 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 There is only low-grade activity in this vicinity characteristic of radiation fibrosis, without focal activity. 4. Other imaging findings of potential clinical significance: Aortic Atherosclerosis (ICD10-I70.0). Coronary atherosclerosis. Mild cardiomegaly. Right nephrectomy. 8 mm nonobstructive calculus in the  left kidney lower pole. -Discussed that pt is clear to begin Sutent again as his liver functions have normalized and in view of the most recent PET scan's findings -Restart Amlodipine and Minoxidil  -I will recheck  liver functions in 3 weeks  -Continue follow up with PCP Dr. Philip Aspen, if liver functions do not go up, PCP and Rheumatologist could evaluate how to manage his RA medications  -Will see the pt back in 6 weeks  #11 DM2 -continue f/u with Dr Philip Aspen for continue mx   Labs in 3 weeks RTC with Dr Irene Limbo with labs in 6 weeks   The total time spent in the appt was 25 minutes and more than 50% was on counseling and direct patient cares.   Sullivan Lone MD MS AAHIVMS [facility] Hematology/Oncology Physician [facility]  (Office):       [phone] (Work cell):  [phone] (Fax):           [phone]  I, Baldwin [place], am acting as a scribe for Dr. Irene Limbo  .I have reviewed the above documentation for accuracy and completeness, and I agree with the above. Brunetta Genera MD

## 2018-03-31 ENCOUNTER — Inpatient Hospital Stay (HOSPITAL_BASED_OUTPATIENT_CLINIC_OR_DEPARTMENT_OTHER): Payer: Medicare Other | Admitting: Hematology

## 2018-03-31 ENCOUNTER — Inpatient Hospital Stay: Payer: Medicare Other

## 2018-03-31 ENCOUNTER — Telehealth: Payer: Self-pay | Admitting: Hematology

## 2018-03-31 ENCOUNTER — Encounter: Payer: Self-pay | Admitting: Hematology

## 2018-03-31 VITALS — BP 179/85 | HR 43 | Temp 97.9°F | Resp 17 | Ht 70.0 in | Wt 190.3 lb

## 2018-03-31 DIAGNOSIS — R945 Abnormal results of liver function studies: Secondary | ICD-10-CM

## 2018-03-31 DIAGNOSIS — N189 Chronic kidney disease, unspecified: Secondary | ICD-10-CM

## 2018-03-31 DIAGNOSIS — C7800 Secondary malignant neoplasm of unspecified lung: Secondary | ICD-10-CM | POA: Diagnosis not present

## 2018-03-31 DIAGNOSIS — Z9049 Acquired absence of other specified parts of digestive tract: Secondary | ICD-10-CM

## 2018-03-31 DIAGNOSIS — M069 Rheumatoid arthritis, unspecified: Secondary | ICD-10-CM

## 2018-03-31 DIAGNOSIS — E119 Type 2 diabetes mellitus without complications: Secondary | ICD-10-CM

## 2018-03-31 DIAGNOSIS — R7989 Other specified abnormal findings of blood chemistry: Secondary | ICD-10-CM

## 2018-03-31 DIAGNOSIS — C7900 Secondary malignant neoplasm of unspecified kidney and renal pelvis: Secondary | ICD-10-CM

## 2018-03-31 DIAGNOSIS — C649 Malignant neoplasm of unspecified kidney, except renal pelvis: Secondary | ICD-10-CM

## 2018-03-31 DIAGNOSIS — C7801 Secondary malignant neoplasm of right lung: Secondary | ICD-10-CM

## 2018-03-31 DIAGNOSIS — Z905 Acquired absence of kidney: Secondary | ICD-10-CM

## 2018-03-31 LAB — CMP (CANCER CENTER ONLY)
ALBUMIN: 3.6 g/dL (ref 3.5–5.0)
ALT: 67 U/L — ABNORMAL HIGH (ref 0–44)
AST: 41 U/L (ref 15–41)
Alkaline Phosphatase: 76 U/L (ref 38–126)
Anion gap: 8 (ref 5–15)
BUN: 17 mg/dL (ref 8–23)
CHLORIDE: 107 mmol/L (ref 98–111)
CO2: 26 mmol/L (ref 22–32)
Calcium: 8.9 mg/dL (ref 8.9–10.3)
Creatinine: 1.48 mg/dL — ABNORMAL HIGH (ref 0.61–1.24)
GFR, Est AFR Am: 54 mL/min — ABNORMAL LOW (ref >60)
GFR, Estimated: 47 mL/min — ABNORMAL LOW (ref >60)
GLUCOSE: 195 mg/dL — AB (ref 70–99)
POTASSIUM: 3.7 mmol/L (ref 3.5–5.1)
Sodium: 141 mmol/L (ref 135–145)
Total Bilirubin: 1 mg/dL (ref 0.3–1.2)
Total Protein: 6 g/dL — ABNORMAL LOW (ref 6.5–8.1)

## 2018-03-31 LAB — CBC WITH DIFFERENTIAL/PLATELET
Basophils Absolute: 0 10*3/uL (ref 0.0–0.1)
Basophils Relative: 1 %
EOS PCT: 3 %
Eosinophils Absolute: 0.1 10*3/uL (ref 0.0–0.5)
HEMATOCRIT: 41.9 % (ref 38.4–49.9)
Hemoglobin: 13.9 g/dL (ref 13.0–17.1)
LYMPHS ABS: 1.1 10*3/uL (ref 0.9–3.3)
LYMPHS PCT: 26 %
MCH: 37.7 pg — AB (ref 27.2–33.4)
MCHC: 33.3 g/dL (ref 32.0–36.0)
MCV: 113.4 fL — AB (ref 79.3–98.0)
MONO ABS: 0.8 10*3/uL (ref 0.1–0.9)
Monocytes Relative: 19 %
NEUTROS ABS: 2.2 10*3/uL (ref 1.5–6.5)
Neutrophils Relative %: 51 %
PLATELETS: 171 10*3/uL (ref 140–400)
RBC: 3.7 MIL/uL — AB (ref 4.20–5.82)
RDW: 12.6 % (ref 11.0–14.6)
WBC: 4.3 10*3/uL (ref 4.0–10.3)

## 2018-03-31 NOTE — Telephone Encounter (Signed)
Gave Pt calendar of upcoming appts.

## 2018-04-01 ENCOUNTER — Other Ambulatory Visit: Payer: Self-pay | Admitting: Hematology

## 2018-04-01 DIAGNOSIS — C649 Malignant neoplasm of unspecified kidney, except renal pelvis: Secondary | ICD-10-CM

## 2018-04-15 MED FILL — SUTENT 37.5 MG CAPSULE: 37.5 | 42 days supply | Qty: 28 | Fill #0

## 2018-04-19 ENCOUNTER — Inpatient Hospital Stay: Payer: Medicare Other | Attending: Hematology

## 2018-04-19 DIAGNOSIS — C649 Malignant neoplasm of unspecified kidney, except renal pelvis: Secondary | ICD-10-CM | POA: Insufficient documentation

## 2018-04-19 DIAGNOSIS — C7900 Secondary malignant neoplasm of unspecified kidney and renal pelvis: Secondary | ICD-10-CM

## 2018-04-19 LAB — CMP (CANCER CENTER ONLY)
ALBUMIN: 3.4 g/dL — AB (ref 3.5–5.0)
ALT: 44 U/L (ref 0–44)
AST: 43 U/L — AB (ref 15–41)
Alkaline Phosphatase: 109 U/L (ref 38–126)
Anion gap: 8 (ref 5–15)
BUN: 19 mg/dL (ref 8–23)
CHLORIDE: 109 mmol/L (ref 98–111)
CO2: 25 mmol/L (ref 22–32)
Calcium: 8.6 mg/dL — ABNORMAL LOW (ref 8.9–10.3)
Creatinine: 1.62 mg/dL — ABNORMAL HIGH (ref 0.61–1.24)
GFR, Est AFR Am: 49 mL/min — ABNORMAL LOW (ref >60)
GFR, Estimated: 42 mL/min — ABNORMAL LOW (ref >60)
GLUCOSE: 147 mg/dL — AB (ref 70–99)
POTASSIUM: 4.5 mmol/L (ref 3.5–5.1)
Sodium: 142 mmol/L (ref 135–145)
TOTAL PROTEIN: 5.7 g/dL — AB (ref 6.5–8.1)
Total Bilirubin: 1.3 mg/dL — ABNORMAL HIGH (ref 0.3–1.2)

## 2018-05-07 NOTE — Progress Notes (Signed)
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 2) Remote history of metastatic renal cell carcinoma Diagnosed with renal cell carcinoma 20 years ago and had a right nephrectomy. About 8 years after that he was noted to have abdominal recurrence in his pancreas spleen and small intestine and had a Whipple's procedure and significant abdominal surgery and notes that 10 out of 17 lymph nodes were positive. He also had his gallbladder removed. Postoperative course was complicated by an internal hemorrhage as per his report. Patient notes 2-3 years after that he had recurrence in his thyroid that led to a thyroidectomy. [June 2004] later he had partial gastrectomy for local recurrence. [February 2005] when he presented with GI bleeding. Patient notes that he has had no known evidence of recurrence over the last 8 years until his recent CT scan showed lung nodules.  Current Treatment: Sutent 37.5 mg po daily for 2 weeks on and 1 weeks off.  Previous treatment Sutent on cycle 1 - 50 mg by mouth daily for 4 weeks on and 2-weeks off. It was dose adjusted to 37.5 mg by mouth daily for 2 weeks with 1 week of to help mitigate issues with fatigue, mild hyperbilirubinemia, cytopenias. SBRT to dominant RML nodule.  INTERVAL HISTORY:  Dylan Jefferson is here for for his scheduled follow-up of his metastatic renal cell carcinoma. The patient's last visit with [place] was on 03/31/18. He is accompanied today by his wife today. The pt reports that he is doing well overall.   His wife notes that the patient stopped taking his 81 mg ASA as being informed  by his Dr. Philip Aspen as he presumed that it was playing a factor in his liver abnormality. However, he was informed that he will retake his ASA soon. He is still off the Plaquenil and he is still having pain. He has an upcoming appointment with his PCP, Dr. Philip Aspen on 10/28 who will review his medications at this time. He is not taking anything for his pain at this time. He notes that he started on his Sutent on Sundays as well as he is taking OTC liver supplements. His blood sugars have been doing well at home.   Lab results today (05/10/18) of CBC w/diff and CMP as follows: all values are WNL except for: RBC at 3.47, HCT at 38.2, MCV at 110.0, MCH at 38.2, Neutro Abs at 1.4.  On review of systems, pt reports bilateral knee pain, bilateral ankle pain, and denies mouth pain, abdominal pain, and any other symptoms.    REVIEW OF SYSTEMS:    A 10+ POINT REVIEW OF SYSTEMS WAS OBTAINED including neurology, dermatology, psychiatry, cardiac, respiratory, lymph, extremities, GI, GU, Musculoskeletal, constitutional, breasts, reproductive, HEENT.  All pertinent positives are noted in the HPI.  All others are negative.    Past Medical History:  Diagnosis Date  . Arthritis   . Cancer [facility])    Renal cell  . DDD (degenerative disc disease), cervical 05/28/2016  . DDD (degenerative disc disease), lumbar 05/28/2016  . Depression   . Diabetes ([place])   . Hyperlipidemia 05/28/2016  . Hypertension   . Hypothyroidism   . 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 Kidney disease   . Nephrolithiasis   . Osteoarthritis of both hands 05/28/2016  . Osteoarthritis of both knees 05/28/2016  . Peripheral vascular disorder ([place]) 05/28/2016  . 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 Rheumatoid arthritis ([place])   . Thyroid cancer ([place]) 05/28/2016    . Past Surgical History:  Procedure Laterality Date  . CATARACT EXTRACTION Bilateral 2013  . CHOLECYSTECTOMY    .  GASTRECTOMY     tumor removed  . LIPOMA EXCISION Right 1999  . NEPHRECTOMY Right   . PANCREATECTOMY    . SPLENECTOMY    . THYROIDECTOMY    . URETERAL STENT PLACEMENT Left 2012  . WHIPPLE PROCEDURE      . Social History   Tobacco Use  . Smoking status: Former Smoker    Years: 1.50  . Smokeless tobacco: Never Used  Substance Use Topics  . Alcohol use: Yes    Comment: occasional beer  . Drug use: No    ALLERGIES:  has No Known Allergies.  MEDICATIONS:  Current Outpatient Medications  Medication Sig Dispense Refill  . amLODipine (NORVASC) 10 MG tablet take 1 tablet by mouth once daily (Patient not taking: Reported on 03/31/2018) 90 tablet 3  . Ascorbic Acid (VITAMIN C) 1000 MG tablet Take 1,000 mg by mouth daily.    Marland Kitchen aspirin 81 MG tablet Take 81 mg by mouth daily.    Marland Kitchen atenolol (TENORMIN) 100 MG tablet     . augmented betamethasone dipropionate (DIPROLENE-AF) 0.05 % cream   0  . calcium citrate-vitamin D (CITRACAL+D) 315-200 MG-UNIT per tablet Take 2 tablets by mouth daily.     . Cyanocobalamin (VITAMIN B-12) 2500 MCG SUBL Take by mouth daily.     Marland Kitchen docusate sodium (COLACE) 100 MG capsule Take 100 mg by mouth 2 (two) times daily.    . dorzolamide-timolol (COSOPT) 22.3-6.8 MG/ML ophthalmic solution place 1 drop into affected eye twice a day  1  . famotidine (PEPCID) 20 MG tablet Take 20 mg by mouth 2 (two) times daily.    . ferrous sulfate 325 (65 FE) MG tablet Take 325 mg by mouth 2 (two) times daily with a meal.     . Glucosamine-MSM-Hyaluronic Acd (JOINT HEALTH) 750-375-30 MG TABS Take 2 tablets by mouth daily.    . hydroxychloroquine (PLAQUENIL) 200 MG tablet take 1 tablet by mouth every morning and 1 tablet by mouth every evening (Patient not taking: Reported on 03/31/2018) 180 tablet 1  . insulin lispro (HUMALOG) 100 UNIT/ML injection Inject into the skin 3 (three) times daily before meals.    . Iron-Vitamins (GERITOL COMPLETE) TABS Take 1 tablet by mouth daily.    .  Lactobacillus (ACIDOPHILUS) CAPS capsule Take 1 capsule by mouth daily.    Marland Kitchen latanoprost (XALATAN) 0.005 % ophthalmic solution place 1 drop into both eyes every evening  1  . LEVEMIR 100 UNIT/ML injection Inject 7 Units into the skin 2 (two) times daily.   1  . levothyroxine (SYNTHROID, LEVOTHROID) 300 MCG tablet Take 300 mcg by mouth 3 (three) times a week.     . lovastatin (MEVACOR) 40 MG tablet Take 40 mg by mouth at bedtime.     . minoxidil (LONITEN) 10 MG tablet Take 10 mg by mouth 2 (two) times daily.     . Multiple Vitamin (MULTIVITAMIN) tablet Take 1 tablet by mouth daily.    . Omega-3 Fatty Acids (FISH OIL) 1200 MG CAPS Take 1 capsule by mouth  daily.    . Pancrelipase, Lip-Prot-Amyl, (CREON) 6000 UNITS CPEP Take 4 capsules by mouth 3 (three) times daily.    . potassium citrate (UROCIT-K) 10 MEQ (1080 MG) SR tablet Take 10 mEq by mouth 2 (two) times daily.     . SUTENT 37.5 MG capsule TAKE 1 CAPSULE (37.5 MG) BY MOUTH ONCE DAILY FOR 2 WEEKS ON, 1 WEEK OFF, REPEAT EVERY 42 DAYS 28 capsule 1  . valsartan-hydrochlorothiazide (DIOVAN-HCT) 160-12.5 MG per tablet     . VOLTAREN 1 % GEL apply 3 grams to LARGE JOINTS three times a day if needed  0   No current facility-administered medications for this visit.     PHYSICAL EXAMINATION: ECOG PERFORMANCE STATUS: 1 - Symptomatic but completely ambulatory  Vitals:   05/10/18 0846  BP: 121/63  Pulse: (!) 49  Resp: 17  Temp: 98.4 F (36.9 C)  SpO2: 99%   Filed Weights   05/10/18 0846  Weight: 197 lb 3.2 oz (89.4 kg)   .Body mass index is 28.3 kg/m.  GENERAL:alert, in no acute distress and comfortable SKIN: no acute rashes, no significant lesions EYES: conjunctiva are pink and non-injected, sclera anicteric OROPHARYNX: MMM, no exudates, no oropharyngeal erythema or ulceration NECK: supple, no JVD LYMPH:  no palpable lymphadenopathy in the cervical, axillary or inguinal regions LUNGS: clear to auscultation b/l with normal respiratory  effort HEART: regular rate & rhythm ABDOMEN:  normoactive bowel sounds , non tender, not distended. No palpable hepatosplenomegaly.  Extremity: 1+ pedal edema PSYCH: alert & oriented x 3 with fluent speech NEURO: no focal motor/sensory deficits   LABORATORY DATA:   I have reviewed the data as listed  . CBC Latest Ref Rng & Units 03/31/2018 03/08/2018 01/25/2018  WBC 4.0 - 10.3 K/uL 4.3 4.4 2.9(L)  Hemoglobin 13.0 - 17.1 g/dL 13.9 13.5 13.9  Hematocrit 38.4 - 49.9 % 41.9 39.0 40.3  Platelets 140 - 400 K/uL 171 155 142  ANC 1.4k . CBC    Component Value Date/Time   WBC 4.3 03/31/2018 0805   RBC 3.70 (L) 03/31/2018 0805   HGB 13.9 03/31/2018 0805   HGB 13.4 12/17/2017 0735   HGB 14.0 06/11/2017 0808   HCT 41.9 03/31/2018 0805   HCT 40.7 06/11/2017 0808   PLT 171 03/31/2018 0805   PLT 198 12/17/2017 0735   PLT 223 06/11/2017 0808   MCV 113.4 (H) 03/31/2018 0805   MCV 112.1 (H) 06/11/2017 0808   MCH 37.7 (H) 03/31/2018 0805   MCHC 33.3 03/31/2018 0805   RDW 12.6 03/31/2018 0805   RDW 13.9 06/11/2017 0808   LYMPHSABS 1.1 03/31/2018 0805   LYMPHSABS 1.6 06/11/2017 0808   MONOABS 0.8 03/31/2018 0805   MONOABS 0.5 06/11/2017 0808   EOSABS 0.1 03/31/2018 0805   EOSABS 0.1 06/11/2017 0808   BASOSABS 0.0 03/31/2018 0805   BASOSABS 0.0 06/11/2017 0808   . CMP Latest Ref Rng & Units 05/10/2018 04/19/2018 03/31/2018  Glucose 70 - 99 mg/dL 48(L) 147(H) 195(H)  BUN 8 - 23 mg/dL 27(H) 19 17  Creatinine 0.61 - 1.24 mg/dL 1.70(H) 1.62(H) 1.48(H)  Sodium 135 - 145 mmol/L 144 142 141  Potassium 3.5 - 5.1 mmol/L 3.8 4.5 3.7  Chloride 98 - 111 mmol/L 111 109 107  CO2 22 - 32 mmol/L 26 25 26   Calcium 8.9 - 10.3 mg/dL 8.8(L) 8.6(L) 8.9  Total Protein 6.5 - 8.1 g/dL 6.1(L) 5.7(L) 6.0(L)  Total Bilirubin 0.3 - 1.2 mg/dL 1.1 1.3(H) 1.0  Alkaline Phos 38 -  126 U/L 121 109 76  AST 15 - 41 U/L 33 43(H) 41  ALT 0 - 44 U/L 38 44 67(H)    RADIOGRAPHIC STUDIES: I have personally reviewed the  radiological images as listed and agreed with the findings in the report. No results found.   ASSESSMENT & PLAN:   67 y.o. Caucasian male with  #1 Recurrent metastatic Renal cell carcinoma with multiple lung metastases.  Has been on stable on 1st line Sutent for >84yr. Has had SBRT to the dominant right lung nodule with partial response. No lab or clinical evidence of metastatic RCC progression at this time.  PET/CT 08/08/2016 with a couple of mildly enlarge LLL pulmonary nodules - no other overt evidence of RCC progression at this time.  PET CT scan 11/06/2016 - no overt evidence of RCC progression at this time .  PET/CT 03/13/2017 - no overt evidence of RCC progression at this time .  PET/CT 08/10/2017 - no overt evidence of RCC progression at this time .  PET/ CT done 08/10/2017, shows no evidence of  Progression at this time.  12/07/17 PET which revealed No findings suspicious for recurrent or metastatic disease.  03/22/18 PET/CT revealed One of the left lower lobe nodules has very minimally enlarged over the last 2 years, currently 0.6 by 0.4 cm and measuring 0.4 by 0.3 cm on 03/20/2016. This probably represents a previously treated metastatic nodule given that it measured 1.0 by 0.8 cm on 06/06/2015. This nodule may be subject to some slice selection phenomenon and also motion artifact given proximity to the diaphragm. Surveillance is likely warranted. 2. Chronic central lucency, marginal bony deposition, and slow healing of the right lateral fourth rib fracture. Possibilities may include underlying radiation necrosis as a cause for fracture leading to slow healing, or a low-grade pathologic lesion. Maximum SUV at this fracture site is 2.9, previously 2.4. 3. 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 There is only low-grade activity in this vicinity characteristic of radiation fibrosis, without focal activity. 4. Other  imaging findings of potential clinical significance: Aortic Atherosclerosis. Coronary atherosclerosis. Mild cardiomegaly. Right nephrectomy. 8 mm nonobstructive calculus in the left kidney lower pole.   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 Confirmed by [place].  #4 mild thrombocytopenia likely related to Sutent- resolved with dose reduction. #5 Abnormal Liver function test - significant bump in transaminases. -resolving on 03/31/18 labs.  --Discussed pt labwork today, 05/10/18; all values are WNL except for: RBC at 3.47, HCT at 38.2, MCV at 110.0, MCH at 38.2, Neutro Abs at 1.4. CMP normal LFTs #6 grade 1 fatigue due to Sutent -better with dose reduction and changing schedule to 2 week on and 1 week. #7 Dysguesia from Sutent - manageable per patient. He is trying to eat as well as he can and is maintaining his body weight.  #8 RBC macrocytosis due to Sutent #9 chronic kidney disease/single kidney - creatinine stable @ 1.5-1.9  Recommended to avoid NSAIDS and other nephrotoxic medications. Optimize control of DM2 #10 rheumatoid arthritis -follows with Dr. Estanislado Pandy for mx #11 DM2 -continue f/u with Dr Philip Aspen for continue mx, blood sugar at home per patient.   PLAN: -Continue on Sutent.  -Continue follow up with PCP, Dr. Philip Aspen to aid with managing his RA medications along with his Rheumatologist.  -Advised that the patient is able to continue on 81 mg ASA  as per his PCP, Dr. Shon Baton recommendations.  -RTC with Dr Irene Limbo in 6 weeks with labs    The total time spent in the appt was 20 minutes and more than 50% was on counseling and direct patient cares.     Sullivan Lone MD MS AAHIVMS [facility] Hematology/Oncology Physician [facility]  (Office):       [phone] (Work cell):  [phone] (Fax):           [phone]   I, Soijett Blue am acting as scribe for Dr. Sullivan Lone   .I have reviewed the above documentation  for accuracy and completeness, and I agree with the above. Brunetta Genera MD

## 2018-05-10 ENCOUNTER — Telehealth: Payer: Self-pay | Admitting: Hematology

## 2018-05-10 ENCOUNTER — Encounter: Payer: Self-pay | Admitting: Hematology

## 2018-05-10 ENCOUNTER — Inpatient Hospital Stay: Payer: Medicare Other | Attending: Hematology

## 2018-05-10 ENCOUNTER — Inpatient Hospital Stay: Payer: Medicare Other | Admitting: Hematology

## 2018-05-10 VITALS — BP 121/63 | HR 49 | Temp 98.4°F | Resp 17 | Ht 70.0 in | Wt 197.2 lb

## 2018-05-10 DIAGNOSIS — C641 Malignant neoplasm of right kidney, except renal pelvis: Secondary | ICD-10-CM | POA: Diagnosis not present

## 2018-05-10 DIAGNOSIS — Z9049 Acquired absence of other specified parts of digestive tract: Secondary | ICD-10-CM | POA: Diagnosis not present

## 2018-05-10 DIAGNOSIS — Z794 Long term (current) use of insulin: Secondary | ICD-10-CM | POA: Diagnosis not present

## 2018-05-10 DIAGNOSIS — Z7982 Long term (current) use of aspirin: Secondary | ICD-10-CM | POA: Diagnosis not present

## 2018-05-10 DIAGNOSIS — Z87891 Personal history of nicotine dependence: Secondary | ICD-10-CM | POA: Diagnosis not present

## 2018-05-10 DIAGNOSIS — Z905 Acquired absence of kidney: Secondary | ICD-10-CM | POA: Diagnosis not present

## 2018-05-10 DIAGNOSIS — Z79899 Other long term (current) drug therapy: Secondary | ICD-10-CM

## 2018-05-10 DIAGNOSIS — E119 Type 2 diabetes mellitus without complications: Secondary | ICD-10-CM

## 2018-05-10 DIAGNOSIS — C649 Malignant neoplasm of unspecified kidney, except renal pelvis: Secondary | ICD-10-CM

## 2018-05-10 DIAGNOSIS — I10 Essential (primary) hypertension: Secondary | ICD-10-CM

## 2018-05-10 DIAGNOSIS — C7900 Secondary malignant neoplasm of unspecified kidney and renal pelvis: Secondary | ICD-10-CM

## 2018-05-10 DIAGNOSIS — C7800 Secondary malignant neoplasm of unspecified lung: Secondary | ICD-10-CM | POA: Insufficient documentation

## 2018-05-10 DIAGNOSIS — D696 Thrombocytopenia, unspecified: Secondary | ICD-10-CM

## 2018-05-10 DIAGNOSIS — C7801 Secondary malignant neoplasm of right lung: Secondary | ICD-10-CM

## 2018-05-10 LAB — CBC WITH DIFFERENTIAL/PLATELET
Basophils Absolute: 0 10*3/uL (ref 0.0–0.1)
Basophils Relative: 1 %
EOS PCT: 2 %
Eosinophils Absolute: 0.1 10*3/uL (ref 0.0–0.5)
HCT: 38.2 % — ABNORMAL LOW (ref 38.4–49.9)
Hemoglobin: 13.3 g/dL (ref 13.0–17.1)
LYMPHS PCT: 47 %
Lymphs Abs: 1.9 10*3/uL (ref 0.9–3.3)
MCH: 38.2 pg — AB (ref 27.2–33.4)
MCHC: 34.7 g/dL (ref 32.0–36.0)
MCV: 110 fL — AB (ref 79.3–98.0)
MONO ABS: 0.6 10*3/uL (ref 0.1–0.9)
MONOS PCT: 14 %
NEUTROS ABS: 1.4 10*3/uL — AB (ref 1.5–6.5)
Neutrophils Relative %: 36 %
Platelets: 175 10*3/uL (ref 140–400)
RBC: 3.47 MIL/uL — ABNORMAL LOW (ref 4.20–5.82)
RDW: 13.7 % (ref 11.0–14.6)
WBC: 4 10*3/uL (ref 4.0–10.3)

## 2018-05-10 LAB — CMP (CANCER CENTER ONLY)
ALBUMIN: 3.8 g/dL (ref 3.5–5.0)
ALT: 38 U/L (ref 0–44)
AST: 33 U/L (ref 15–41)
Alkaline Phosphatase: 121 U/L (ref 38–126)
Anion gap: 7 (ref 5–15)
BUN: 27 mg/dL — AB (ref 8–23)
CO2: 26 mmol/L (ref 22–32)
Calcium: 8.8 mg/dL — ABNORMAL LOW (ref 8.9–10.3)
Chloride: 111 mmol/L (ref 98–111)
Creatinine: 1.7 mg/dL — ABNORMAL HIGH (ref 0.61–1.24)
GFR, Est AFR Am: 46 mL/min — ABNORMAL LOW (ref >60)
GFR, Estimated: 40 mL/min — ABNORMAL LOW (ref >60)
GLUCOSE: 48 mg/dL — AB (ref 70–99)
POTASSIUM: 3.8 mmol/L (ref 3.5–5.1)
SODIUM: 144 mmol/L (ref 135–145)
Total Bilirubin: 1.1 mg/dL (ref 0.3–1.2)
Total Protein: 6.1 g/dL — ABNORMAL LOW (ref 6.5–8.1)

## 2018-05-10 NOTE — Telephone Encounter (Signed)
Scheduled appt per 10/7 los - gave patient AVS and calender per los.   

## 2018-06-14 MED FILL — SUTENT 37.5 MG CAPSULE: 37.5 | 42 days supply | Qty: 28 | Fill #1

## 2018-06-17 NOTE — Progress Notes (Signed)
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 2) Remote history of metastatic renal cell carcinoma Diagnosed with renal cell carcinoma 20 years ago and had a right nephrectomy. About 8 years after that he was noted to have abdominal recurrence in his pancreas spleen and small intestine and had a Whipple's procedure and significant abdominal surgery and notes that 10 out of 17 lymph nodes were positive. He also had his gallbladder removed. Postoperative course was complicated by an internal hemorrhage as per his report. Patient notes 2-3 years after that he had recurrence in his thyroid that led to a thyroidectomy. [June 2004] later he had partial gastrectomy for local recurrence. [February 2005] when he presented with GI bleeding. Patient notes that he has had no known evidence of recurrence over the last 8 years until his recent CT scan showed lung nodules.  Current Treatment: Sutent 37.5 mg po daily for 2 weeks on and 1 weeks off.  Previous treatment Sutent on cycle 1 - 50 mg by mouth daily for 4 weeks on and 2-weeks off. It was dose adjusted to 37.5 mg by mouth daily for 2 weeks with 1 week of to help mitigate issues with fatigue, mild hyperbilirubinemia, cytopenias. SBRT to dominant RML nodule.  INTERVAL HISTORY:  Dylan Jefferson is here for for his scheduled follow-up of his metastatic renal cell carcinoma. The patient's last visit with [place] was on 05/10/18.   The pt reports that he has stopped 200mg  Plaquenil in the interim in conversation with his PCP, and began Gabapentin for 30 days. He will continue follow up with his PCP  in 2 weeks. He notes that he has had joint pain related to his RA since stopping Plaquenil. He notes that his blood sugars have been okay, and his last A1C was 7.8, and is planning on establishing care with an endocrinologist.   The pt notes that he has not developed any other new concerns.   Lab results today (06/21/18) of CBC w/diff and CMP is as follows: all values are WNL except for RBC at 3.33, HGB at 12.5, HCT at 36.1, MCV at 108.4, MCH at 37.5, Glucose at 198, BUN at 26, Creatinine at 1.75, Calcium at 8.4, Total Protein at 5.3, Albumin at 3.4, Total Bilirubin at 1.6, GFR at 38. 06/21/18 LDH is 213  On review of systems, pt reports joint pain, eating well, staying hydrated, and denies difficulty breathing, mouth sores, abdominal pains, and any other symptoms.   REVIEW OF SYSTEMS:    A 10+ POINT REVIEW OF SYSTEMS WAS OBTAINED including neurology, dermatology, psychiatry, cardiac, respiratory, lymph, extremities, GI, GU, Musculoskeletal, constitutional, breasts, reproductive, HEENT.  All pertinent positives are noted in the HPI.  All others are negative.    Past Medical History:  Diagnosis Date  . Arthritis   . Cancer [facility])    Renal cell  . DDD (degenerative disc disease), cervical 05/28/2016  . DDD (degenerative disc disease), lumbar 05/28/2016  . Depression   . Diabetes ([place])   . Hyperlipidemia 05/28/2016  . Hypertension   . Hypothyroidism   . 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 Kidney disease   .  Nephrolithiasis   . Osteoarthritis of both hands 05/28/2016  . Osteoarthritis of both knees 05/28/2016  . Peripheral vascular disorder ([place]) 05/28/2016  . 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 Rheumatoid arthritis ([place])   . Thyroid cancer ([place]) 05/28/2016    . Past Surgical History:  Procedure Laterality Date  . CATARACT EXTRACTION Bilateral 2013  . CHOLECYSTECTOMY    . GASTRECTOMY     tumor removed  . LIPOMA EXCISION  Right 1999  . NEPHRECTOMY Right   . PANCREATECTOMY    . SPLENECTOMY    . THYROIDECTOMY    . URETERAL STENT PLACEMENT Left 2012  . WHIPPLE PROCEDURE      . Social History   Tobacco Use  . Smoking status: Former Smoker    Years: 1.50  . Smokeless tobacco: Never Used  Substance Use Topics  . Alcohol use: Yes    Comment: occasional beer  . Drug use: No    ALLERGIES:  has No Known Allergies.  MEDICATIONS:  Current Outpatient Medications  Medication Sig Dispense Refill  . amLODipine (NORVASC) 10 MG tablet take 1 tablet by mouth once daily (Patient not taking: Reported on 03/31/2018) 90 tablet 3  . Ascorbic Acid (VITAMIN C) 1000 MG tablet Take 1,000 mg by mouth daily.    Marland Kitchen aspirin 81 MG tablet Take 81 mg by mouth daily.    Marland Kitchen atenolol (TENORMIN) 100 MG tablet     . augmented betamethasone dipropionate (DIPROLENE-AF) 0.05 % cream   0  . calcium citrate-vitamin D (CITRACAL+D) 315-200 MG-UNIT per tablet Take 2 tablets by mouth daily.     . Cyanocobalamin (VITAMIN B-12) 2500 MCG SUBL Take by mouth daily.     Marland Kitchen docusate sodium (COLACE) 100 MG capsule Take 100 mg by mouth 2 (two) times daily.    . dorzolamide-timolol (COSOPT) 22.3-6.8 MG/ML ophthalmic solution place 1 drop into affected eye twice a day  1  . famotidine (PEPCID) 20 MG tablet Take 20 mg by mouth 2 (two) times daily.    . ferrous sulfate 325 (65 FE) MG tablet Take 325 mg by mouth 2 (two) times daily with a meal.     . Glucosamine-MSM-Hyaluronic Acd (JOINT HEALTH) 750-375-30 MG TABS Take 2 tablets by mouth daily.    . hydroxychloroquine (PLAQUENIL) 200 MG tablet take 1 tablet by mouth every morning and 1 tablet by mouth every evening (Patient not taking: Reported on 03/31/2018) 180 tablet 1  . insulin lispro (HUMALOG) 100 UNIT/ML injection Inject into the skin 3 (three) times daily before meals.    . Iron-Vitamins (GERITOL COMPLETE) TABS Take 1 tablet by mouth daily.    . Lactobacillus (ACIDOPHILUS) CAPS capsule Take 1 capsule  by mouth daily.    Marland Kitchen latanoprost (XALATAN) 0.005 % ophthalmic solution place 1 drop into both eyes every evening  1  . LEVEMIR 100 UNIT/ML injection Inject 7 Units into the skin 2 (two) times daily.   1  . levothyroxine (SYNTHROID, LEVOTHROID) 300 MCG tablet Take 300 mcg by mouth 3 (three) times a week.     . lovastatin (MEVACOR) 40 MG tablet Take 40 mg by mouth at bedtime.     . minoxidil (LONITEN) 10 MG tablet Take 10 mg by mouth 2 (two) times daily.     . Multiple Vitamin (MULTIVITAMIN) tablet Take 1 tablet by mouth daily.    . Omega-3 Fatty Acids (FISH OIL) 1200 MG CAPS Take 1 capsule by mouth daily.    . Pancrelipase, Lip-Prot-Amyl, (CREON) 6000 UNITS CPEP Take 4  capsules by mouth 3 (three) times daily.    . potassium citrate (UROCIT-K) 10 MEQ (1080 MG) SR tablet Take 10 mEq by mouth 2 (two) times daily.     . SUTENT 37.5 MG capsule TAKE 1 CAPSULE (37.5 MG) BY MOUTH ONCE DAILY FOR 2 WEEKS ON, 1 WEEK OFF, REPEAT EVERY 42 DAYS 28 capsule 1  . valsartan-hydrochlorothiazide (DIOVAN-HCT) 160-12.5 MG per tablet     . VOLTAREN 1 % GEL apply 3 grams to LARGE JOINTS three times a day if needed  0   No current facility-administered medications for this visit.     PHYSICAL EXAMINATION: ECOG PERFORMANCE STATUS: 1 - Symptomatic but completely ambulatory  Vitals:   06/21/18 0835  BP: (!) 142/84  Pulse: (!) 51  Resp: 18  Temp: 98 F (36.7 C)  SpO2: 98%   Filed Weights   06/21/18 0835  Weight: 200 lb 3.2 oz (90.8 kg)   .Body mass index is 28.73 kg/m.  GENERAL:alert, in no acute distress and comfortable SKIN: no acute rashes, no significant lesions EYES: conjunctiva are pink and non-injected, sclera anicteric OROPHARYNX: MMM, no exudates, no oropharyngeal erythema or ulceration NECK: supple, no JVD LYMPH:  no palpable lymphadenopathy in the cervical, axillary or inguinal regions LUNGS: clear to auscultation b/l with normal respiratory effort HEART: regular rate & rhythm ABDOMEN:   normoactive bowel sounds , non tender, not distended. No palpable hepatosplenomegaly.  Extremity: trace pedal edema PSYCH: alert & oriented x 3 with fluent speech NEURO: no focal motor/sensory deficits   LABORATORY DATA:   I have reviewed the data as listed  . CBC Latest Ref Rng & Units 06/21/2018 05/10/2018 03/31/2018  WBC 4.0 - 10.5 K/uL 4.4 4.0 4.3  Hemoglobin 13.0 - 17.0 g/dL 12.5(L) 13.3 13.9  Hematocrit 39.0 - 52.0 % 36.1(L) 38.2(L) 41.9  Platelets 150 - 400 K/uL 205 175 171  ANC 1.4k . CBC    Component Value Date/Time   WBC 4.4 06/21/2018 0754   RBC 3.33 (L) 06/21/2018 0754   HGB 12.5 (L) 06/21/2018 0754   HGB 13.4 12/17/2017 0735   HGB 14.0 06/11/2017 0808   HCT 36.1 (L) 06/21/2018 0754   HCT 40.7 06/11/2017 0808   PLT 205 06/21/2018 0754   PLT 198 12/17/2017 0735   PLT 223 06/11/2017 0808   MCV 108.4 (H) 06/21/2018 0754   MCV 112.1 (H) 06/11/2017 0808   MCH 37.5 (H) 06/21/2018 0754   MCHC 34.6 06/21/2018 0754   RDW 14.7 06/21/2018 0754   RDW 13.9 06/11/2017 0808   LYMPHSABS 1.5 06/21/2018 0754   LYMPHSABS 1.6 06/11/2017 0808   MONOABS 0.8 06/21/2018 0754   MONOABS 0.5 06/11/2017 0808   EOSABS 0.1 06/21/2018 0754   EOSABS 0.1 06/11/2017 0808   BASOSABS 0.0 06/21/2018 0754   BASOSABS 0.0 06/11/2017 0808   . CMP Latest Ref Rng & Units 06/21/2018 05/10/2018 04/19/2018  Glucose 70 - 99 mg/dL 198(H) 48(L) 147(H)  BUN 8 - 23 mg/dL 26(H) 27(H) 19  Creatinine 0.61 - 1.24 mg/dL 1.75(H) 1.70(H) 1.62(H)  Sodium 135 - 145 mmol/L 142 144 142  Potassium 3.5 - 5.1 mmol/L 4.5 3.8 4.5  Chloride 98 - 111 mmol/L 109 111 109  CO2 22 - 32 mmol/L 26 26 25   Calcium 8.9 - 10.3 mg/dL 8.4(L) 8.8(L) 8.6(L)  Total Protein 6.5 - 8.1 g/dL 5.3(L) 6.1(L) 5.7(L)  Total Bilirubin 0.3 - 1.2 mg/dL 1.6(H) 1.1 1.3(H)  Alkaline Phos 38 - 126 U/L 103 121 109  AST 15 -  41 U/L 36 33 43(H)  ALT 0 - 44 U/L 35 38 44    RADIOGRAPHIC STUDIES: I have personally reviewed the radiological images as  listed and agreed with the findings in the report. No results found.   ASSESSMENT & PLAN:   67 y.o. Caucasian male with  #1 Recurrent metastatic Renal cell carcinoma with multiple lung metastases.  Has been on stable on 1st line Sutent for >40yr. Has had SBRT to the dominant right lung nodule with partial response. No lab or clinical evidence of metastatic RCC progression at this time.  PET/CT 08/08/2016 with a couple of mildly enlarge LLL pulmonary nodules - no other overt evidence of RCC progression at this time.  PET CT scan 11/06/2016 - no overt evidence of RCC progression at this time .  PET/CT 03/13/2017 - no overt evidence of RCC progression at this time .  PET/CT 08/10/2017 - no overt evidence of RCC progression at this time .  PET/ CT done 08/10/2017, shows no evidence of  Progression at this time.  12/07/17 PET which revealed No findings suspicious for recurrent or metastatic disease.  03/22/18 PET/CT revealed One of the left lower lobe nodules has very minimally enlarged over the last 2 years, currently 0.6 by 0.4 cm and measuring 0.4 by 0.3 cm on 03/20/2016. This probably represents a previously treated metastatic nodule given that it measured 1.0 by 0.8 cm on 06/06/2015. This nodule may be subject to some slice selection phenomenon and also motion artifact given proximity to the diaphragm. Surveillance is likely warranted. 2. Chronic central lucency, marginal bony deposition, and slow healing of the right lateral fourth rib fracture. Possibilities may include underlying radiation necrosis as a cause for fracture leading to slow healing, or a low-grade pathologic lesion. Maximum SUV at this fracture site is 2.9, previously 2.4. 3. 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 There is only low-grade activity in this vicinity characteristic of radiation fibrosis, without focal activity. 4. Other imaging findings of  potential clinical significance: Aortic Atherosclerosis. Coronary atherosclerosis. Mild cardiomegaly. Right nephrectomy. 8 mm nonobstructive calculus in the left kidney lower pole.   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 Confirmed by [place].  #4 mild thrombocytopenia likely related to Sutent- resolved with dose reduction. #5 Abnormal Liver function test - significant bump in transaminases. -resolved on labs today #6 grade 1 fatigue due to Sutent -better with dose reduction and changing schedule to 2 week on and 1 week. #7 Dysguesia from Sutent - manageable per patient. He is trying to eat as well as he can and is maintaining his body weight.  #8 RBC macrocytosis due to Sutent #9 chronic kidney disease/single kidney - creatinine stable @ 1.5-1.9  Recommended to avoid NSAIDS and other nephrotoxic medications. Optimize control of DM2 #10 rheumatoid arthritis -follows with Dr. Estanislado Pandy for mx #11 DM2 -continue f/u with Dr Philip Aspen for continue mx, blood sugar at home per patient.   PLAN: -Advised that the patient is able to continue on 81 mg ASA as per his PCP, Dr. Shon Baton recommendations.  -Discussed pt labwork today, 06/21/18; liver transaminases have normalized which is reassuring that Sutent was not causative in previous liver function elevation -The pt has no prohibitive toxicities from continuing Sutent at this time.   -Will repeat PET/CT in 2 months in 2nd week of jan 2020 as per patients scheduling preferences -continue follow  up with PCP and rheumatology for RA management - appears to be having a flare off plaquenil. In light of CKD NSAIDS not appropriate, DM2 limits significant use of steroids. Would need to choose least immunosuppressive route given RCC is an immunosupression sensitive malignancy. -Will see pt back in 2 months     PET/CT in 8 weeks RTC with Dr Irene Limbo in 9 weeks with labs   The total time spent in the appt was 25  minutes and more than 50% was on counseling and direct patient cares.    Sullivan Lone MD [place] AAHIVMS [facility] Hematology/Oncology Physician [place]  (Office):       [phone] (Work cell):  [phone] (Fax):           [phone]   I, Baldwin [place], am acting as a scribe for Dr. Sullivan Lone,   .I have reviewed the above documentation for accuracy and completeness, and I agree with the above. Brunetta Genera MD

## 2018-06-21 ENCOUNTER — Telehealth: Payer: Self-pay

## 2018-06-21 ENCOUNTER — Encounter: Payer: Self-pay | Admitting: Hematology

## 2018-06-21 ENCOUNTER — Inpatient Hospital Stay: Payer: Medicare Other

## 2018-06-21 ENCOUNTER — Inpatient Hospital Stay: Payer: Medicare Other | Attending: Hematology | Admitting: Hematology

## 2018-06-21 VITALS — BP 142/84 | HR 51 | Temp 98.0°F | Resp 18 | Ht 70.0 in | Wt 200.2 lb

## 2018-06-21 DIAGNOSIS — C7801 Secondary malignant neoplasm of right lung: Secondary | ICD-10-CM

## 2018-06-21 DIAGNOSIS — I2510 Atherosclerotic heart disease of native coronary artery without angina pectoris: Secondary | ICD-10-CM | POA: Insufficient documentation

## 2018-06-21 DIAGNOSIS — R7989 Other specified abnormal findings of blood chemistry: Secondary | ICD-10-CM

## 2018-06-21 DIAGNOSIS — D696 Thrombocytopenia, unspecified: Secondary | ICD-10-CM | POA: Diagnosis not present

## 2018-06-21 DIAGNOSIS — Z7982 Long term (current) use of aspirin: Secondary | ICD-10-CM | POA: Diagnosis not present

## 2018-06-21 DIAGNOSIS — Z9049 Acquired absence of other specified parts of digestive tract: Secondary | ICD-10-CM | POA: Insufficient documentation

## 2018-06-21 DIAGNOSIS — C7800 Secondary malignant neoplasm of unspecified lung: Secondary | ICD-10-CM | POA: Diagnosis not present

## 2018-06-21 DIAGNOSIS — Z87891 Personal history of nicotine dependence: Secondary | ICD-10-CM | POA: Insufficient documentation

## 2018-06-21 DIAGNOSIS — N189 Chronic kidney disease, unspecified: Secondary | ICD-10-CM | POA: Insufficient documentation

## 2018-06-21 DIAGNOSIS — Z79899 Other long term (current) drug therapy: Secondary | ICD-10-CM | POA: Diagnosis not present

## 2018-06-21 DIAGNOSIS — E1122 Type 2 diabetes mellitus with diabetic chronic kidney disease: Secondary | ICD-10-CM | POA: Diagnosis not present

## 2018-06-21 DIAGNOSIS — C649 Malignant neoplasm of unspecified kidney, except renal pelvis: Secondary | ICD-10-CM

## 2018-06-21 DIAGNOSIS — I129 Hypertensive chronic kidney disease with stage 1 through stage 4 chronic kidney disease, or unspecified chronic kidney disease: Secondary | ICD-10-CM | POA: Diagnosis not present

## 2018-06-21 DIAGNOSIS — Z905 Acquired absence of kidney: Secondary | ICD-10-CM | POA: Insufficient documentation

## 2018-06-21 DIAGNOSIS — C641 Malignant neoplasm of right kidney, except renal pelvis: Secondary | ICD-10-CM | POA: Diagnosis not present

## 2018-06-21 DIAGNOSIS — E039 Hypothyroidism, unspecified: Secondary | ICD-10-CM | POA: Diagnosis not present

## 2018-06-21 DIAGNOSIS — E1151 Type 2 diabetes mellitus with diabetic peripheral angiopathy without gangrene: Secondary | ICD-10-CM | POA: Insufficient documentation

## 2018-06-21 DIAGNOSIS — Z794 Long term (current) use of insulin: Secondary | ICD-10-CM | POA: Diagnosis not present

## 2018-06-21 DIAGNOSIS — R945 Abnormal results of liver function studies: Secondary | ICD-10-CM

## 2018-06-21 LAB — CMP (CANCER CENTER ONLY)
ALBUMIN: 3.4 g/dL — AB (ref 3.5–5.0)
ALT: 35 U/L (ref 0–44)
AST: 36 U/L (ref 15–41)
Alkaline Phosphatase: 103 U/L (ref 38–126)
Anion gap: 7 (ref 5–15)
BUN: 26 mg/dL — ABNORMAL HIGH (ref 8–23)
CHLORIDE: 109 mmol/L (ref 98–111)
CO2: 26 mmol/L (ref 22–32)
CREATININE: 1.75 mg/dL — AB (ref 0.61–1.24)
Calcium: 8.4 mg/dL — ABNORMAL LOW (ref 8.9–10.3)
GFR, EST AFRICAN AMERICAN: 44 mL/min — AB (ref >60)
GFR, Estimated: 38 mL/min — ABNORMAL LOW (ref >60)
GLUCOSE: 198 mg/dL — AB (ref 70–99)
Potassium: 4.5 mmol/L (ref 3.5–5.1)
SODIUM: 142 mmol/L (ref 135–145)
Total Bilirubin: 1.6 mg/dL — ABNORMAL HIGH (ref 0.3–1.2)
Total Protein: 5.3 g/dL — ABNORMAL LOW (ref 6.5–8.1)

## 2018-06-21 LAB — CBC WITH DIFFERENTIAL/PLATELET
Abs Immature Granulocytes: 0 10*3/uL (ref 0.00–0.07)
BASOS ABS: 0 10*3/uL (ref 0.0–0.1)
Basophils Relative: 1 %
Eosinophils Absolute: 0.1 10*3/uL (ref 0.0–0.5)
Eosinophils Relative: 2 %
HEMATOCRIT: 36.1 % — AB (ref 39.0–52.0)
Hemoglobin: 12.5 g/dL — ABNORMAL LOW (ref 13.0–17.0)
Immature Granulocytes: 0 %
LYMPHS ABS: 1.5 10*3/uL (ref 0.7–4.0)
LYMPHS PCT: 35 %
MCH: 37.5 pg — AB (ref 26.0–34.0)
MCHC: 34.6 g/dL (ref 30.0–36.0)
MCV: 108.4 fL — AB (ref 80.0–100.0)
MONOS PCT: 18 %
Monocytes Absolute: 0.8 10*3/uL (ref 0.1–1.0)
NRBC: 0 % (ref 0.0–0.2)
Neutro Abs: 2 10*3/uL (ref 1.7–7.7)
Neutrophils Relative %: 44 %
Platelets: 205 10*3/uL (ref 150–400)
RBC: 3.33 MIL/uL — ABNORMAL LOW (ref 4.22–5.81)
RDW: 14.7 % (ref 11.5–15.5)
WBC: 4.4 10*3/uL (ref 4.0–10.5)

## 2018-06-21 LAB — LACTATE DEHYDROGENASE: LDH: 213 U/L — AB (ref 98–192)

## 2018-06-21 NOTE — Patient Instructions (Signed)
Thank you for choosing [facility] to provide your oncology and hematology care.  To afford each patient quality time with our providers, please arrive 30 minutes before your scheduled appointment time.  If you arrive late for your appointment, you may be asked to reschedule.  We strive to give you quality time with our providers, and arriving late affects you and other patients whose appointments are after yours.  If you are a no show for multiple scheduled visits, you may be dismissed from the clinic at the providers discretion.   Again, thank you for choosing [facility], our hope is that these requests will decrease the amount of time that you wait before being seen by our physicians.  ______________________________________________________________________ Should you have questions after your visit to the [facility], please contact our office at [phone] between the hours of 8:30 and 4:30 p.m.    Voicemails left after 4:30p.m will not be returned until the following business day.   For prescription refill requests, please have your pharmacy contact us directly.  Please also try to allow 48 hours for prescription requests.   Please contact the scheduling department for questions regarding scheduling.  For scheduling of procedures such as PET scans, CT scans, MRI, Ultrasound, etc please contact central scheduling at [phone].   Resources For Cancer Patients and Caregivers:  American Cancer Society:  800-227-2345  Can help patients locate various types of support and financial assistance Cancer Care: 1-800-813-HOPE (4673) Provides financial assistance, online support groups, medication/co-pay assistance.   [place] DSS:  336-641-3447 Where to apply for food stamps, Medicaid, and utility assistance Medicare Rights Center: 800-333-4114 Helps people with Medicare understand their rights and benefits, navigate the Medicare system, and secure the  quality healthcare they deserve SCAT: 336-333-6589 [place] [employer]'s shared-ride transportation service for eligible riders who have a disability that prevents them from riding the fixed route bus.   For additional information on assistance programs please contact our social worker:   Grier Hock/Abigail Elmore:  [phone] 

## 2018-06-21 NOTE — Telephone Encounter (Signed)
Printed avs and calender of upcoming appointment. Per 11/18 los 

## 2018-07-22 ENCOUNTER — Other Ambulatory Visit: Payer: Self-pay | Admitting: Hematology

## 2018-07-22 DIAGNOSIS — C649 Malignant neoplasm of unspecified kidney, except renal pelvis: Secondary | ICD-10-CM

## 2018-07-26 ENCOUNTER — Telehealth: Payer: Self-pay | Admitting: Pharmacist

## 2018-07-26 NOTE — Telephone Encounter (Signed)
Oral Oncology Pharmacist Encounter  Received notification from the [facility] long outpatient pharmacy that patient's copayment grant that was help covering the out-of-pocket cost for Sutent has expired. There are no other grant foundation monies available at this time.  I called and discussed with patient. He is currently on his week off and does have enough capsules on hand for another 2 weeks of dosing. Needs new Sutent to start 08/22/2018. He is not able to afford copayment due at the pharmacy to receive next fill of Sutent.  Patient informed that we will continue to surveill for open copayment grant foundation money and will secure him another grant as soon as we are able. Patient shared household size and stated yearly income with me. If we are unable to secure patient another copayment grant we will assist patient in the manufacturer assistance process.  Patient currently on Sutent 37.5 mg capsules taken for 2 weeks on, one-week off, and then repeated. I do have Sutent 50 mg capsule samples that I can provide to patient in January in order to prevent treatment break.  I called the [facility] long outpatient pharmacy and canceled patient's Sutent fill that was supposed to be shipped to him today.  We will coordinate continued medication acquisition with either the [facility] long outpatient pharmacy or with [facility] patient assistance program right after the first of the year.  All questions answered. Patient expressed understanding. He knows to call the office with any additional questions or concerns.  Johny Drilling, PharmD, BCPS, BCOP  07/26/2018 9:20 AM Oral Oncology Clinic [phone]

## 2018-07-30 ENCOUNTER — Telehealth: Payer: Self-pay

## 2018-07-30 NOTE — Telephone Encounter (Signed)
error 

## 2018-08-06 ENCOUNTER — Telehealth: Payer: Self-pay

## 2018-08-06 NOTE — Telephone Encounter (Addendum)
Oral Oncology Patient Advocate Encounter  I was successful at securing a grant with Cancer Care for $7,000. This will keep the out of pocket expense for Sutent at $0. The grant information is as follows and has been shared with [place].  Approval dates: 08/05/18-08/06/19 ID: [id number] Group: [id number] BIN: [id number] PCN: [id number]  I called the patient and gave him the good news. We scheduled his next fill to ship to him on 08/18/18 and I set a refill call for 09/24/18. He verbalized understanding and great appreciation   [place] Patient [place] Phone [phone] Fax [phone]

## 2018-08-13 ENCOUNTER — Telehealth: Payer: Self-pay | Admitting: *Deleted

## 2018-08-13 NOTE — Telephone Encounter (Signed)
Called by patient. Patient states he also left a message with the triage nurse. He had a PET scan scheduled on 08/16/2018. Radiology called him today. The appointment is cancelled as insurance has not authorized PET scan. He called [employer] Medicare today, and per patient, spoke with 3 people. He was advised that no request for authorization ever received from [facility] for his PET. Insurer gave patient this number: [phone]. Per patient, they told him if the cancer center called that number the PET would be authorized. Patient has concerns about delaying the PET then delaying  MD appt on 1/20.  Per [facility] rep: request for authorization sent to insurer from [facility] on 08/11/2018, case# [id number], now pending approval. Insurer will notify [facility] when approved as per usual process. [place] rep the number for insurer provided by patient as patient requested. Per [facility] rep, if patient wanted case number, patient could call insurer and ask about status of request.  Notified patient of information learned from [facility] and provided case number if he wanted to call. Patient stated he is not pleased with [facility]'s current process to seek authorization. He stated he is not angry with caller, but states there has to be a better way to do this. Patient stated he will not call insurer at this time, he will wait and see what happens. He stated that if PET was after 08/23/2018, his MD appt will need to be rescheduled.

## 2018-08-13 NOTE — Telephone Encounter (Signed)
"  Delphina Cahill [phone]).  Someone needs to figure out what's going on and call me.  Radiology called this morning saying Monday's PET scan was cancelled because of insurance.  I have insurance.  Just on Wednesday received call with co-pay information.  This does not sit well with me!  Sorry.  I'm not taking this out on you.  This is poor planning."  F/U scheduled 08-23-2018 8:40 am after lab at 8:00 am.

## 2018-08-16 ENCOUNTER — Encounter (HOSPITAL_COMMUNITY): Payer: Medicare Other

## 2018-08-18 ENCOUNTER — Other Ambulatory Visit: Payer: Self-pay | Admitting: Hematology

## 2018-08-18 DIAGNOSIS — C7900 Secondary malignant neoplasm of unspecified kidney and renal pelvis: Secondary | ICD-10-CM

## 2018-08-18 MED FILL — SUTENT 37.5 MG CAPSULE: 37.5 | 21 days supply | Qty: 14 | Fill #0

## 2018-08-19 ENCOUNTER — Telehealth: Payer: Self-pay | Admitting: Hematology

## 2018-08-19 ENCOUNTER — Telehealth: Payer: Self-pay | Admitting: *Deleted

## 2018-08-19 NOTE — Telephone Encounter (Signed)
Scheduled appt per 1/16 sch message - pt is aware of appt date and time

## 2018-08-19 NOTE — Telephone Encounter (Signed)
Patient's insurance informed [place] managed care that only 4 PETs allowed per this diagnosis and only 1 left. Per Dr. Irene Limbo, patient can choose to use the last PET or can get CTs done (ordered and authorized). Dr. Irene Limbo recommended to have CTs done for monitoring purposes and only get PET if indicated. Contacted patient who agreed with Dr. Grier Mitts recommendation. Patient will call radiology to get CTs scheduled. Current appointments for 1/20 lab/md cancelled and scheduling message sent asking for patient to be scheduled on 2/6 (his choice) at 8:00 lab and 8:40 MD.

## 2018-08-23 ENCOUNTER — Telehealth: Payer: Self-pay | Admitting: *Deleted

## 2018-08-23 ENCOUNTER — Inpatient Hospital Stay: Payer: Medicare Other

## 2018-08-23 ENCOUNTER — Inpatient Hospital Stay: Payer: Medicare Other | Admitting: Hematology

## 2018-08-23 NOTE — Telephone Encounter (Signed)
Patient asked if possible to find out who the [facility] managed care staff spoke with at [facility] when they called about getting the PET approved. Advised him that not sure that is possible, but will ask and call him with or without information. Patient verbalized understanding.

## 2018-08-23 NOTE — Telephone Encounter (Signed)
error 

## 2018-08-24 ENCOUNTER — Encounter (HOSPITAL_COMMUNITY): Payer: Self-pay

## 2018-08-24 ENCOUNTER — Ambulatory Visit (HOSPITAL_COMMUNITY)
Admission: RE | Admit: 2018-08-24 | Discharge: 2018-08-24 | Disposition: A | Discharge status: Home / Self Care | Payer: Medicare Other | Source: Ambulatory Visit | Attending: Hematology | Admitting: Hematology

## 2018-08-24 DIAGNOSIS — C7900 Secondary malignant neoplasm of unspecified kidney and renal pelvis: Secondary | ICD-10-CM | POA: Diagnosis not present

## 2018-08-25 ENCOUNTER — Telehealth: Payer: Self-pay | Admitting: *Deleted

## 2018-08-25 NOTE — Telephone Encounter (Signed)
Contacted patient to give [facility] number [phone] and case#[id number] as supplied by [facility] managed care. Patient encouraged to contact [facility] for further information on denial of PET. Patient verbalized understanding, stated he had received a letter and further information from [facility] including this contact information. Patient verbalized understanding.

## 2018-09-08 NOTE — Progress Notes (Signed)
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 2) Remote history of metastatic renal cell carcinoma Diagnosed with renal cell carcinoma 20 years ago and had a right nephrectomy. About 8 years after that he was noted to have abdominal recurrence in his pancreas spleen and small intestine and had a Whipple's procedure and significant abdominal surgery and notes that 10 out of 17 lymph nodes were positive. He also had his gallbladder removed. Postoperative course was complicated by an internal hemorrhage as per his report. Patient notes 2-3 years after that he had recurrence in his thyroid that led to a thyroidectomy. [June 2004] later he had partial gastrectomy for local recurrence. [February 2005] when he presented with GI bleeding. Patient notes that he has had no known evidence of recurrence over the last 8 years until his recent CT scan showed lung nodules.  Current Treatment: Sutent 37.5 mg po daily for 2 weeks on and 1 weeks off.  Previous treatment Sutent on cycle 1 - 50 mg by mouth daily for 4 weeks on and 2-weeks off. It was dose adjusted to 37.5 mg by mouth daily for 2 weeks with 1 week of to help mitigate issues with fatigue, mild hyperbilirubinemia, cytopenias. SBRT to dominant RML nodule.  INTERVAL HISTORY:  Dylan Jefferson is here for for his scheduled follow-up of his metastatic renal cell carcinoma. The patient's last visit with [place] was on 06/21/18. He is accompanied today by his wife. The pt reports that he is doing well overall.  The pt reports that he has continued to hold Plaquenil in the interim, and has been  taking a "mild pain medication," the name of which he cannot remember, and does endorse joint pains. He continues follow up with his PCP for management of his RA. The pt notes that he also saw an endocrinologist in the interim for further management of his DM.  The pt notes that he has not had any difficulty taking Sutent in the interim. He endorses stable energy levels. He denies mouth sores. He notes that he is doing well emotionally "most of the times."  Of note since the patient's last visit, pt has had a CT C/A/P completed on 08/24/18 with results revealing Stable small left lower lobe pulmonary nodules from recent prior studies. As previously noted, these have decreased in size from PET-CT 06/06/2015, likely treated metastases. No new or enlarging pulmonary nodules. 2. Postsurgical changes in the upper abdomen without evidence of local recurrence or metastatic disease. 3. Nonobstructing left renal calculus. 4. Stable radiation changes in the right upper lobe with probable chronic pathologic fracture of the right 4th rib laterally.  Lab results today (09/09/18) of CBC w/diff and CMP is as follows: all values are WNL except for WBC at 3.9k, RBC at 3.72, MCV at 108.3, MCH at 37.6, PLT at 131k, ANC at 1.2k, Glucose at 138, Creatinine at 1.73, Calcium at 8.5, Total Protein at 5.9, Total bilirubin at 1.4, GFR at 40. 09/09/18 LDH is pending  On review of systems, pt reports joint pains, stable energy levels, and denies mouth sores, and any other symptoms.  REVIEW OF SYSTEMS:    A 10+ POINT REVIEW OF SYSTEMS WAS OBTAINED  including neurology, dermatology, psychiatry, cardiac, respiratory, lymph, extremities, GI, GU, Musculoskeletal, constitutional, breasts, reproductive, HEENT.  All pertinent positives are noted in the HPI.  All others are negative.    Past Medical History:  Diagnosis Date  . Arthritis   . Cancer [facility])    Renal cell  . DDD (degenerative disc disease), cervical 05/28/2016  . DDD  (degenerative disc disease), lumbar 05/28/2016  . Depression   . Diabetes ([place])   . Hyperlipidemia 05/28/2016  . Hypertension   . Hypothyroidism   . 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 Kidney disease   . Nephrolithiasis   . Osteoarthritis of both hands 05/28/2016  . Osteoarthritis of both knees 05/28/2016  . Peripheral vascular disorder ([place]) 05/28/2016  . 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 Rheumatoid arthritis ([place])   . Thyroid cancer ([place]) 05/28/2016    . Past Surgical History:  Procedure Laterality Date  . CATARACT EXTRACTION Bilateral 2013  . CHOLECYSTECTOMY    . GASTRECTOMY     tumor removed  . LIPOMA EXCISION Right 1999  . NEPHRECTOMY Right   . PANCREATECTOMY    . SPLENECTOMY    . THYROIDECTOMY    . URETERAL STENT PLACEMENT Left 2012  . WHIPPLE PROCEDURE      . Social History   Tobacco Use  . Smoking status: Former Smoker    Years: 1.50  . Smokeless tobacco: Never Used  Substance Use Topics  . Alcohol use: Yes    Comment: occasional beer  . Drug use: No    ALLERGIES:  has No Known Allergies.  MEDICATIONS:  Current Outpatient Medications  Medication Sig Dispense Refill  . amLODipine (NORVASC) 10 MG tablet take 1 tablet by mouth once daily (Patient not taking: Reported on 03/31/2018) 90 tablet 3  . Ascorbic Acid (VITAMIN C) 1000 MG tablet Take 1,000 mg by mouth daily.    Marland Kitchen aspirin 81 MG tablet Take 81 mg by mouth daily.    Marland Kitchen atenolol (TENORMIN) 100 MG tablet     . augmented betamethasone dipropionate (DIPROLENE-AF) 0.05 % cream   0  . calcium citrate-vitamin D (CITRACAL+D) 315-200 MG-UNIT per tablet Take 2 tablets by mouth daily.     . Cyanocobalamin (VITAMIN B-12) 2500 MCG SUBL Take by mouth daily.     Marland Kitchen docusate sodium (COLACE) 100 MG capsule Take 100 mg by mouth 2 (two) times daily.    . dorzolamide-timolol (COSOPT) 22.3-6.8 MG/ML ophthalmic solution place 1 drop into affected eye twice a day  1    . famotidine (PEPCID) 20 MG tablet Take 20 mg by mouth 2 (two) times daily.    . ferrous sulfate 325 (65 FE) MG tablet Take 325 mg by mouth 2 (two) times daily with a meal.     . Glucosamine-MSM-Hyaluronic Acd (JOINT HEALTH) 750-375-30 MG TABS Take 2 tablets by mouth daily.    . hydroxychloroquine (PLAQUENIL) 200 MG tablet take 1 tablet by mouth every morning and 1 tablet by mouth every evening (Patient not taking: Reported on 03/31/2018) 180 tablet 1  . insulin lispro (HUMALOG) 100 UNIT/ML injection Inject into the skin 3 (three) times daily before meals.    . Iron-Vitamins (GERITOL COMPLETE) TABS Take 1 tablet by mouth daily.    . Lactobacillus (ACIDOPHILUS) CAPS capsule Take 1 capsule by mouth daily.    Marland Kitchen latanoprost (XALATAN) 0.005 % ophthalmic solution place 1 drop into both eyes every evening  1  . LEVEMIR 100 UNIT/ML injection Inject 7  Units into the skin 2 (two) times daily.   1  . levothyroxine (SYNTHROID, LEVOTHROID) 300 MCG tablet Take 300 mcg by mouth 3 (three) times a week.     . lovastatin (MEVACOR) 40 MG tablet Take 40 mg by mouth at bedtime.     . minoxidil (LONITEN) 10 MG tablet Take 10 mg by mouth 2 (two) times daily.     . Multiple Vitamin (MULTIVITAMIN) tablet Take 1 tablet by mouth daily.    . Omega-3 Fatty Acids (FISH OIL) 1200 MG CAPS Take 1 capsule by mouth daily.    . Pancrelipase, Lip-Prot-Amyl, (CREON) 6000 UNITS CPEP Take 4 capsules by mouth 3 (three) times daily.    . potassium citrate (UROCIT-K) 10 MEQ (1080 MG) SR tablet Take 10 mEq by mouth 2 (two) times daily.     . SUTENT 37.5 MG capsule TAKE 1 CAPSULE (37.5 MG) BY MOUTH ONCE DAILY FOR 2 WEEKS ON, 1 WEEK OFF, REPEAT EVERY 42 DAYS 28 capsule 1  . valsartan-hydrochlorothiazide (DIOVAN-HCT) 160-12.5 MG per tablet     . VOLTAREN 1 % GEL apply 3 grams to LARGE JOINTS three times a day if needed  0   No current facility-administered medications for this visit.     PHYSICAL EXAMINATION: ECOG PERFORMANCE STATUS: 1 -  Symptomatic but completely ambulatory  Vitals:   09/09/18 0832  BP: (!) 153/82  Pulse: (!) 44  Resp: 18  Temp: 97.9 F (36.6 C)  SpO2: 99%   Filed Weights   09/09/18 0832  Weight: 190 lb 9.6 oz (86.5 kg)   .Body mass index is 27.35 kg/m.  GENERAL:alert, in no acute distress and comfortable SKIN: no acute rashes, no significant lesions EYES: conjunctiva are pink and non-injected, sclera anicteric OROPHARYNX: MMM, no exudates, no oropharyngeal erythema or ulceration NECK: supple, no JVD LYMPH:  no palpable lymphadenopathy in the cervical, axillary or inguinal regions LUNGS: clear to auscultation b/l with normal respiratory effort HEART: regular rate & rhythm ABDOMEN:  normoactive bowel sounds , non tender, not distended. No palpable hepatosplenomegaly.  Extremity: trace pedal edema PSYCH: alert & oriented x 3 with fluent speech NEURO: no focal motor/sensory deficits   LABORATORY DATA:   I have reviewed the data as listed  . CBC Latest Ref Rng & Units 09/09/2018 06/21/2018 05/10/2018  WBC 4.0 - 10.5 K/uL 3.9(L) 4.4 4.0  Hemoglobin 13.0 - 17.0 g/dL 14.0 12.5(L) 13.3  Hematocrit 39.0 - 52.0 % 40.3 36.1(L) 38.2(L)  Platelets 150 - 400 K/uL 131(L) 205 175  ANC 1.4k . CBC    Component Value Date/Time   WBC 3.9 (L) 09/09/2018 0811   RBC 3.72 (L) 09/09/2018 0811   HGB 14.0 09/09/2018 0811   HGB 13.4 12/17/2017 0735   HGB 14.0 06/11/2017 0808   HCT 40.3 09/09/2018 0811   HCT 40.7 06/11/2017 0808   PLT 131 (L) 09/09/2018 0811   PLT 198 12/17/2017 0735   PLT 223 06/11/2017 0808   MCV 108.3 (H) 09/09/2018 0811   MCV 112.1 (H) 06/11/2017 0808   MCH 37.6 (H) 09/09/2018 0811   MCHC 34.7 09/09/2018 0811   RDW 12.7 09/09/2018 0811   RDW 13.9 06/11/2017 0808   LYMPHSABS 1.8 09/09/2018 0811   LYMPHSABS 1.6 06/11/2017 0808   MONOABS 0.5 09/09/2018 0811   MONOABS 0.5 06/11/2017 0808   EOSABS 0.3 09/09/2018 0811   EOSABS 0.1 06/11/2017 0808   BASOSABS 0.0 09/09/2018 0811    BASOSABS 0.0 06/11/2017 0808   . CMP Latest Ref Rng &  Units 09/09/2018 06/21/2018 05/10/2018  Glucose 70 - 99 mg/dL 138(H) 198(H) 48(L)  BUN 8 - 23 mg/dL 14 26(H) 27(H)  Creatinine 0.61 - 1.24 mg/dL 1.73(H) 1.75(H) 1.70(H)  Sodium 135 - 145 mmol/L 143 142 144  Potassium 3.5 - 5.1 mmol/L 3.9 4.5 3.8  Chloride 98 - 111 mmol/L 110 109 111  CO2 22 - 32 mmol/L 26 26 26   Calcium 8.9 - 10.3 mg/dL 8.5(L) 8.4(L) 8.8(L)  Total Protein 6.5 - 8.1 g/dL 5.9(L) 5.3(L) 6.1(L)  Total Bilirubin 0.3 - 1.2 mg/dL 1.4(H) 1.6(H) 1.1  Alkaline Phos 38 - 126 U/L 92 103 121  AST 15 - 41 U/L 30 36 33  ALT 0 - 44 U/L 32 35 38    RADIOGRAPHIC STUDIES: I have personally reviewed the radiological images as listed and agreed with the findings in the report. Ct Abdomen Pelvis Wo Contrast  Result Date: 08/24/2018 CLINICAL DATA:  Metastatic renal cell carcinoma (mixed clear cell/sarcomatoid histology). Multiple surgeries for abdominal and thyroid recurrence. Pulmonary metastases. EXAM: CT CHEST, ABDOMEN AND PELVIS WITHOUT CONTRAST TECHNIQUE: Multidetector CT imaging of the chest, abdomen and pelvis was performed following the standard protocol without IV contrast. COMPARISON:  PET-CT 03/22/2018 and 12/07/2017. FINDINGS: CT CHEST FINDINGS Cardiovascular: Coronary artery atherosclerosis again noted. No acute vascular findings on noncontrast imaging. The heart size is normal. There is no pericardial effusion. Mediastinum/Nodes: There are no enlarged mediastinal, hilar or axillary lymph nodes. Hilar assessment is limited by the lack of intravenous contrast, although the hilar contours appear unchanged. Previous thyroidectomy. The esophagus and trachea appear normal. Lungs/Pleura: No pleural effusion or pneumothorax. There is stable linear opacities in right upper lobe and superior segment of the right lower lobe attributed to prior radiation therapy. Stable calcified right lower lobe granuloma on image 72/6. Well-circumscribed 6 x 4  mm left lower lobe nodule on image 133/6 is stable. There is a stable 3 mm left lower lobe nodule on image 140/6. No new or enlarging nodules. Musculoskeletal/Chest wall: No chest wall mass or acute osseous findings. Delayed healing of the right 4th rib laterally again noted, probably related to previous radiation therapy. CT ABDOMEN AND PELVIS FINDINGS Hepatobiliary: No focal hepatic lesions identified on noncontrast imaging. There is stable pneumobilia post cholecystectomy and Whipple procedure. Pancreas: The remaining pancreas appears atrophied status post Whipple procedure. Spleen: Previous splenectomy. Adrenals/Urinary Tract: Both adrenal glands appear normal. Previous right nephrectomy without mass in the nephrectomy bed. Stable 9 mm calculus in the lower pole of the left kidney. Probable small cyst in the interpolar region of the left kidney. No hydronephrosis or bladder abnormality. Stomach/Bowel: Stable postsurgical changes in the stomach from previous partial gastrectomy. No bowel wall thickening, significant distention or surrounding inflammation. Moderate stool throughout the colon. Vascular/Lymphatic: There are no enlarged abdominal or pelvic lymph nodes. Aortic and branch vessel atherosclerosis. No acute vascular findings on noncontrast imaging. Reproductive: The prostate gland and seminal vesicles appear normal. Other: No evidence of abdominal wall mass or hernia. No ascites. Musculoskeletal: No acute or significant osseous findings. IMPRESSION: 1. Stable small left lower lobe pulmonary nodules from recent prior studies. As previously noted, these have decreased in size from PET-CT 06/06/2015, likely treated metastases. No new or enlarging pulmonary nodules. 2. Postsurgical changes in the upper abdomen without evidence of local recurrence or metastatic disease. 3. Nonobstructing left renal calculus. 4. Stable radiation changes in the right upper lobe with probable chronic pathologic fracture of the  right 4th rib laterally. Electronically Signed   By: Gwyndolyn Saxon  Lin Landsman M.D.   On: 08/24/2018 14:05   Ct Chest Wo Contrast  Result Date: 08/24/2018 CLINICAL DATA:  Metastatic renal cell carcinoma (mixed clear cell/sarcomatoid histology). Multiple surgeries for abdominal and thyroid recurrence. Pulmonary metastases. EXAM: CT CHEST, ABDOMEN AND PELVIS WITHOUT CONTRAST TECHNIQUE: Multidetector CT imaging of the chest, abdomen and pelvis was performed following the standard protocol without IV contrast. COMPARISON:  PET-CT 03/22/2018 and 12/07/2017. FINDINGS: CT CHEST FINDINGS Cardiovascular: Coronary artery atherosclerosis again noted. No acute vascular findings on noncontrast imaging. The heart size is normal. There is no pericardial effusion. Mediastinum/Nodes: There are no enlarged mediastinal, hilar or axillary lymph nodes. Hilar assessment is limited by the lack of intravenous contrast, although the hilar contours appear unchanged. Previous thyroidectomy. The esophagus and trachea appear normal. Lungs/Pleura: No pleural effusion or pneumothorax. There is stable linear opacities in right upper lobe and superior segment of the right lower lobe attributed to prior radiation therapy. Stable calcified right lower lobe granuloma on image 72/6. Well-circumscribed 6 x 4 mm left lower lobe nodule on image 133/6 is stable. There is a stable 3 mm left lower lobe nodule on image 140/6. No new or enlarging nodules. Musculoskeletal/Chest wall: No chest wall mass or acute osseous findings. Delayed healing of the right 4th rib laterally again noted, probably related to previous radiation therapy. CT ABDOMEN AND PELVIS FINDINGS Hepatobiliary: No focal hepatic lesions identified on noncontrast imaging. There is stable pneumobilia post cholecystectomy and Whipple procedure. Pancreas: The remaining pancreas appears atrophied status post Whipple procedure. Spleen: Previous splenectomy. Adrenals/Urinary Tract: Both adrenal glands  appear normal. Previous right nephrectomy without mass in the nephrectomy bed. Stable 9 mm calculus in the lower pole of the left kidney. Probable small cyst in the interpolar region of the left kidney. No hydronephrosis or bladder abnormality. Stomach/Bowel: Stable postsurgical changes in the stomach from previous partial gastrectomy. No bowel wall thickening, significant distention or surrounding inflammation. Moderate stool throughout the colon. Vascular/Lymphatic: There are no enlarged abdominal or pelvic lymph nodes. Aortic and branch vessel atherosclerosis. No acute vascular findings on noncontrast imaging. Reproductive: The prostate gland and seminal vesicles appear normal. Other: No evidence of abdominal wall mass or hernia. No ascites. Musculoskeletal: No acute or significant osseous findings. IMPRESSION: 1. Stable small left lower lobe pulmonary nodules from recent prior studies. As previously noted, these have decreased in size from PET-CT 06/06/2015, likely treated metastases. No new or enlarging pulmonary nodules. 2. Postsurgical changes in the upper abdomen without evidence of local recurrence or metastatic disease. 3. Nonobstructing left renal calculus. 4. Stable radiation changes in the right upper lobe with probable chronic pathologic fracture of the right 4th rib laterally. Electronically Signed   By: Richardean Sale M.D.   On: 08/24/2018 14:05     ASSESSMENT & PLAN:   68 y.o. Caucasian male with  #1 Recurrent metastatic Renal cell carcinoma with multiple lung metastases.  Has been on stable on 1st line Sutent for >37yr. Has had SBRT to the dominant right lung nodule with partial response. No lab or clinical evidence of metastatic RCC progression at this time.  PET/CT 08/08/2016 with a couple of mildly enlarge LLL pulmonary nodules - no other overt evidence of RCC progression at this time.  PET CT scan 11/06/2016 - no overt evidence of RCC progression at this time .  PET/CT 03/13/2017 -  no overt evidence of RCC progression at this time .  PET/CT 08/10/2017 - no overt evidence of RCC progression at this time .  PET/ CT  done 08/10/2017, shows no evidence of  Progression at this time.  12/07/17 PET which revealed No findings suspicious for recurrent or metastatic disease.  03/22/18 PET/CT revealed One of the left lower lobe nodules has very minimally enlarged over the last 2 years, currently 0.6 by 0.4 cm and measuring 0.4 by 0.3 cm on 03/20/2016. This probably represents a previously treated metastatic nodule given that it measured 1.0 by 0.8 cm on 06/06/2015. This nodule may be subject to some slice selection phenomenon and also motion artifact given proximity to the diaphragm. Surveillance is likely warranted. 2. Chronic central lucency, marginal bony deposition, and slow healing of the right lateral fourth rib fracture. Possibilities may include underlying radiation necrosis as a cause for fracture leading to slow healing, or a low-grade pathologic lesion. Maximum SUV at this fracture site is 2.9, previously 2.4. 3. 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 There is only low-grade activity in this vicinity characteristic of radiation fibrosis, without focal activity. 4. Other imaging findings of potential clinical significance: Aortic Atherosclerosis. Coronary atherosclerosis. Mild cardiomegaly. Right nephrectomy. 8 mm nonobstructive calculus in the left kidney lower pole.   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 Confirmed by [place].  #4 mild thrombocytopenia likely related to Sutent- resolved with dose reduction. #5 Abnormal Liver function test - significant bump in transaminases. -resolved on labs today #6 grade 1 fatigue due to Sutent -better with dose reduction and changing schedule to 2 week on and 1 week. #7 Dysguesia from Sutent - manageable per patient. He  is trying to eat as well as he can and is maintaining his body weight.  #8 RBC macrocytosis due to Sutent #9 chronic kidney disease/single kidney - creatinine stable @ 1.5-1.9  Recommended to avoid NSAIDS and other nephrotoxic medications. Optimize control of DM2 #10 rheumatoid arthritis -follows with Dr. Estanislado Pandy for mx #11 DM2 -continue f/u with Dr Philip Aspen for continue mx, blood sugar at home per patient.   PLAN: -Discussed pt labwork today, 09/09/18; HGB normalized to 14.0, blood counts and chemistries stable. LDH is . Lab Results  Component Value Date   LDH 214 (H) 09/09/2018   -Discussed the 08/24/18 CT C/A/P which revealed Stable small left lower lobe pulmonary nodules from recent prior studies. As previously noted, these have decreased in size from PET-CT 06/06/2015, likely treated metastases. No new or enlarging pulmonary nodules. 2. Postsurgical changes in the upper abdomen without evidence of local recurrence or metastatic disease. 3. Nonobstructing left renal calculus. 4. Stable radiation changes in the right upper lobe with probable chronic pathologic fracture of the right 4th rib laterally. -The pt shows no clinical, radiographic, or lab progression of RCC at this time. -The pt has no prohibitive toxicities from continuing Sutent at this time. -Advised that the patient is able to continue on 81 mg ASA as per his PCP, Dr. Shon Baton recommendations.  -Continue follow up with PCP and rheumatology for RA management - appears to be having a flare off plaquenil. In light of CKD NSAIDS not appropriate, DM2 limits significant use of steroids. Would need to choose least immunosuppressive route given RCC is an immunosupression sensitive malignancy. -Will see the pt back in 6-7 weeks   RTC with Dr Irene Limbo with labs in 6-7 weeks   The total time spent in the appt was 25 minutes and more than 50% was on counseling and direct patient cares.  Sullivan Lone MD [place] AAHIVMS [facility]  [facility] Hematology/Oncology Physician [facility]  (Office):       [phone] (Work cell):  [phone] (Fax):           [phone]  I, Baldwin [place], am acting as a scribe for Dr. Sullivan Lone.   .I have reviewed the above documentation for accuracy and completeness, and I agree with the above. Brunetta Genera MD

## 2018-09-09 ENCOUNTER — Inpatient Hospital Stay: Payer: Medicare Other | Admitting: Hematology

## 2018-09-09 ENCOUNTER — Inpatient Hospital Stay: Payer: Medicare Other | Attending: Hematology

## 2018-09-09 ENCOUNTER — Telehealth: Payer: Self-pay | Admitting: Hematology

## 2018-09-09 VITALS — BP 153/82 | HR 44 | Temp 97.9°F | Resp 18 | Ht 70.0 in | Wt 190.6 lb

## 2018-09-09 DIAGNOSIS — Z7982 Long term (current) use of aspirin: Secondary | ICD-10-CM | POA: Diagnosis not present

## 2018-09-09 DIAGNOSIS — D7589 Other specified diseases of blood and blood-forming organs: Secondary | ICD-10-CM | POA: Diagnosis not present

## 2018-09-09 DIAGNOSIS — C649 Malignant neoplasm of unspecified kidney, except renal pelvis: Secondary | ICD-10-CM

## 2018-09-09 DIAGNOSIS — C7800 Secondary malignant neoplasm of unspecified lung: Secondary | ICD-10-CM

## 2018-09-09 DIAGNOSIS — I10 Essential (primary) hypertension: Secondary | ICD-10-CM | POA: Insufficient documentation

## 2018-09-09 DIAGNOSIS — E119 Type 2 diabetes mellitus without complications: Secondary | ICD-10-CM | POA: Diagnosis not present

## 2018-09-09 DIAGNOSIS — Z87891 Personal history of nicotine dependence: Secondary | ICD-10-CM | POA: Diagnosis not present

## 2018-09-09 DIAGNOSIS — C641 Malignant neoplasm of right kidney, except renal pelvis: Secondary | ICD-10-CM | POA: Diagnosis present

## 2018-09-09 DIAGNOSIS — Z794 Long term (current) use of insulin: Secondary | ICD-10-CM | POA: Insufficient documentation

## 2018-09-09 DIAGNOSIS — Z905 Acquired absence of kidney: Secondary | ICD-10-CM | POA: Diagnosis not present

## 2018-09-09 DIAGNOSIS — D696 Thrombocytopenia, unspecified: Secondary | ICD-10-CM | POA: Insufficient documentation

## 2018-09-09 DIAGNOSIS — Z79899 Other long term (current) drug therapy: Secondary | ICD-10-CM

## 2018-09-09 DIAGNOSIS — R7989 Other specified abnormal findings of blood chemistry: Secondary | ICD-10-CM

## 2018-09-09 DIAGNOSIS — R945 Abnormal results of liver function studies: Secondary | ICD-10-CM

## 2018-09-09 DIAGNOSIS — C7900 Secondary malignant neoplasm of unspecified kidney and renal pelvis: Secondary | ICD-10-CM

## 2018-09-09 LAB — CMP (CANCER CENTER ONLY)
ALT: 32 U/L (ref 0–44)
ANION GAP: 7 (ref 5–15)
AST: 30 U/L (ref 15–41)
Albumin: 3.7 g/dL (ref 3.5–5.0)
Alkaline Phosphatase: 92 U/L (ref 38–126)
BUN: 14 mg/dL (ref 8–23)
CO2: 26 mmol/L (ref 22–32)
Calcium: 8.5 mg/dL — ABNORMAL LOW (ref 8.9–10.3)
Chloride: 110 mmol/L (ref 98–111)
Creatinine: 1.73 mg/dL — ABNORMAL HIGH (ref 0.61–1.24)
GFR, Est AFR Am: 46 mL/min — ABNORMAL LOW (ref >60)
GFR, Estimated: 40 mL/min — ABNORMAL LOW (ref >60)
Glucose, Bld: 138 mg/dL — ABNORMAL HIGH (ref 70–99)
Potassium: 3.9 mmol/L (ref 3.5–5.1)
Sodium: 143 mmol/L (ref 135–145)
Total Bilirubin: 1.4 mg/dL — ABNORMAL HIGH (ref 0.3–1.2)
Total Protein: 5.9 g/dL — ABNORMAL LOW (ref 6.5–8.1)

## 2018-09-09 LAB — CBC WITH DIFFERENTIAL/PLATELET
Abs Immature Granulocytes: 0 10*3/uL (ref 0.00–0.07)
Basophils Absolute: 0 10*3/uL (ref 0.0–0.1)
Basophils Relative: 1 %
Eosinophils Absolute: 0.3 10*3/uL (ref 0.0–0.5)
Eosinophils Relative: 9 %
HCT: 40.3 % (ref 39.0–52.0)
Hemoglobin: 14 g/dL (ref 13.0–17.0)
Immature Granulocytes: 0 %
Lymphocytes Relative: 46 %
Lymphs Abs: 1.8 10*3/uL (ref 0.7–4.0)
MCH: 37.6 pg — ABNORMAL HIGH (ref 26.0–34.0)
MCHC: 34.7 g/dL (ref 30.0–36.0)
MCV: 108.3 fL — ABNORMAL HIGH (ref 80.0–100.0)
Monocytes Absolute: 0.5 10*3/uL (ref 0.1–1.0)
Monocytes Relative: 13 %
NEUTROS ABS: 1.2 10*3/uL — AB (ref 1.7–7.7)
NEUTROS PCT: 31 %
PLATELETS: 131 10*3/uL — AB (ref 150–400)
RBC: 3.72 MIL/uL — ABNORMAL LOW (ref 4.22–5.81)
RDW: 12.7 % (ref 11.5–15.5)
WBC: 3.9 10*3/uL — ABNORMAL LOW (ref 4.0–10.5)
nRBC: 0 % (ref 0.0–0.2)

## 2018-09-09 LAB — LACTATE DEHYDROGENASE: LDH: 214 U/L — AB (ref 98–192)

## 2018-09-09 NOTE — Telephone Encounter (Signed)
Scheduled appt per 2/06 los.  Printed calendar and avs.

## 2018-09-13 ENCOUNTER — Telehealth: Payer: Self-pay | Admitting: Pharmacist

## 2018-09-13 NOTE — Telephone Encounter (Signed)
Oral Oncology Pharmacist Encounter  Received notification from the [facility] long outpatient pharmacy that they are unable to dispense entire 6-week supply of Sutent at one time as copayment grant only allows a 30 calendar day supply at a time.  I called [employer] cop[employer]ment [employer] to inquire on how to dispense Sutent for a 42-day supply, as this is how the medication is FDA indicated. I was informed that they are able to put an override on each dispense for Sutent if patient wishes to receive 42-day supply at a time. Other option is for patient to receive quantity #14 capsules every 21 days, however patient would rather receive entire quantity for each cycle.  In order to dispense 42-day supply at a time, the pharmacy will have to contact [employer] to obtain an override with each dispense.  Pharmacy should call [phone], option 3  They will need to report dispensed date, drug and strength, copayment amount, and quantity dispensed/day supply, for an override to be put in place.  I have taking care of this today. I was informed that override should be in place within an hour, and then the pharmacy will be able to dispense entire quantity needed for 42 days supply at that time. Pharmacy has been updated with above information.  Johny Drilling, PharmD, BCPS, BCOP  09/13/2018 1:06 PM Oral Oncology Clinic [phone]

## 2018-09-13 NOTE — Telephone Encounter (Signed)
Oral Oncology Pharmacist Encounter  Prior authorization for Sutent 37.5mg  renewed to [employer] on Cover [email or web address]  Key: [id number] Status is approved [id number] Effective dates: 09/13/2018-08/04/2019  This information has been shared with dispensing pharmacy.  Johny Drilling, PharmD, BCPS, BCOP  09/13/2018 11:55 AM Oral Oncology Clinic [phone]

## 2018-09-14 MED FILL — SUTENT 37.5 MG CAPSULE: 37.5 | 42 days supply | Qty: 28 | Fill #1

## 2018-09-16 ENCOUNTER — Telehealth: Payer: Self-pay

## 2018-09-16 NOTE — Telephone Encounter (Signed)
Oral Oncology Patient Advocate Encounter  I was successful at securing a grant with [employer]. This will keep the out of pocket expense for Sutent at $0. The grant information is as follows and has been shared with [place].  Approval dates: 09/16/18-10/16/18 This is a conditional approval. Once the patient gets the letter, fills out the form attached and sends it back he will be approved for the rest of this year.  ID: [id number] Group: [id number] BIN: [id number] PCN: [id number]  I called the patient to share the good news and had to leave a voicemail.   [place] Patient [place] Phone [phone] Fax [phone]

## 2018-10-12 NOTE — Telephone Encounter (Signed)
Oral Oncology Patient Advocate Encounter  I faxed the completed form today, 10/12/18  Dylan Jefferson Patient [place] Phone [phone] Fax [phone] 10/12/2018   11:18 AM

## 2018-10-12 NOTE — Telephone Encounter (Signed)
Oral Oncology Patient Advocate Encounter  I checked on TAF grant status and it still says "expires 10/16/18". I called the patient and asked him if he has received the form to fill out and send back to [employer] to extend his grant to the end of this year. The patient stated that he had received an approval letter but no form to go with it. I chatted with [employer] on their website and they sent me a link to the form, I filled the form out and the patient is coming in to sign it today.   [place] Patient [place] Phone [phone] Fax [phone] 10/12/2018   10:35 AM

## 2018-10-13 ENCOUNTER — Other Ambulatory Visit: Payer: Self-pay | Admitting: *Deleted

## 2018-10-13 DIAGNOSIS — C7900 Secondary malignant neoplasm of unspecified kidney and renal pelvis: Secondary | ICD-10-CM

## 2018-10-15 NOTE — Telephone Encounter (Signed)
Oral Oncology Patient Advocate Encounter  [employer] grant has been extended to 08/04/19.  [place] Patient [place] Phone [phone] Fax [phone] 10/15/2018   11:28 AM

## 2018-10-19 ENCOUNTER — Telehealth: Payer: Self-pay | Admitting: *Deleted

## 2018-10-19 ENCOUNTER — Telehealth: Payer: Self-pay | Admitting: Hematology

## 2018-10-19 NOTE — Telephone Encounter (Signed)
Patient would like to reschedule current lab/MD appt on 3/26 for a month out due to concerns r/t spread of covid-19. Per Dr.Kale, ok to reschedule. Patient advised to contact office for questions or concerns. Msg sent to reschedule and contact patient.

## 2018-10-19 NOTE — Telephone Encounter (Signed)
Scheduled appt per 3/17 sch message - pt is aware of appt date and time

## 2018-10-26 MED FILL — SUTENT 37.5 MG CAPSULE: 37.5 | 21 days supply | Qty: 14 | Fill #2

## 2018-10-28 ENCOUNTER — Ambulatory Visit: Payer: Medicare Other | Admitting: Hematology

## 2018-10-28 ENCOUNTER — Other Ambulatory Visit: Payer: Medicare Other

## 2018-11-11 ENCOUNTER — Other Ambulatory Visit: Payer: Self-pay | Admitting: Hematology

## 2018-11-11 DIAGNOSIS — C649 Malignant neoplasm of unspecified kidney, except renal pelvis: Secondary | ICD-10-CM

## 2018-11-16 MED FILL — SUTENT 37.5 MG CAPSULE: 37.5 | 42 days supply | Qty: 28 | Fill #0

## 2018-11-18 ENCOUNTER — Telehealth: Payer: Self-pay | Admitting: *Deleted

## 2018-11-18 NOTE — Telephone Encounter (Signed)
Patient called to reschedule in a month currently April 23.Marland Kitchen Requested a Thursday, May 21, first appt. Informed him that schedule message will be sent.

## 2018-11-19 ENCOUNTER — Telehealth: Payer: Self-pay | Admitting: Hematology

## 2018-11-19 NOTE — Telephone Encounter (Signed)
Scheduled appt for 5/21 per sch msg. Called patient. No answer, left msg. Mailed printout

## 2018-11-25 ENCOUNTER — Other Ambulatory Visit: Payer: Medicare Other

## 2018-11-25 ENCOUNTER — Ambulatory Visit: Payer: Medicare Other | Admitting: Hematology

## 2018-12-20 ENCOUNTER — Telehealth: Payer: Self-pay | Admitting: *Deleted

## 2018-12-20 ENCOUNTER — Telehealth: Payer: Self-pay | Admitting: Hematology

## 2018-12-20 NOTE — Telephone Encounter (Signed)
Have appt this Thursday. Does he need to come in for it at this time with COVID 19 concerns?  Per Dr. Irene Limbo, patient can delay appt one month if he chooses. Patient given this information. Patient wants to wait a month. For transportation reasons, needs appt on Monday or Thursday, early morning. Either June 15, 18, or 22, with 18 being first choice.  Informed patient that appts for 5/21 will be cancelled and message sent to schedulers for reschedule with his preferences. Patient verbalized understanding.

## 2018-12-20 NOTE — Telephone Encounter (Signed)
Spoke with patient re 6/22 lab/fu.

## 2018-12-23 ENCOUNTER — Inpatient Hospital Stay: Payer: Medicare Other | Admitting: Hematology

## 2018-12-23 ENCOUNTER — Inpatient Hospital Stay: Payer: Medicare Other

## 2018-12-28 MED FILL — SUTENT 37.5 MG CAPSULE: 37.5 | 42 days supply | Qty: 28 | Fill #1

## 2019-01-20 NOTE — Progress Notes (Signed)
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 2) Remote history of metastatic renal cell carcinoma Diagnosed with renal cell carcinoma 20 years ago and had a right nephrectomy. About 8 years after that he was noted to have abdominal recurrence in his pancreas spleen and small intestine and had a Whipple's procedure and significant abdominal surgery and notes that 10 out of 17 lymph nodes were positive. He also had his gallbladder removed. Postoperative course was complicated by an internal hemorrhage as per his report. Patient notes 2-3 years after that he had recurrence in his thyroid that led to a thyroidectomy. [June 2004] later he had partial gastrectomy for local recurrence. [February 2005] when he presented with GI bleeding. Patient notes that he has had no known evidence of recurrence over the last 8 years until his recent CT scan showed lung nodules.  Current Treatment: Sutent 37.5 mg po daily for 2 weeks on and 1 weeks off.  Previous treatment Sutent on cycle 1 - 50 mg by mouth daily for 4 weeks on and 2-weeks off. It was dose adjusted to 37.5 mg by mouth daily for 2 weeks with 1 week of to help mitigate issues with fatigue, mild hyperbilirubinemia, cytopenias. SBRT to dominant RML nodule.  INTERVAL HISTORY:  Dylan Jefferson is here for for his scheduled follow-up of his metastatic renal cell carcinoma. The patient's last visit with [place] was on 09/09/18. The pt reports that he is doing well overall.  The pt reports that he has been social distancing during the coronavirus pandemic and denies any concerns for infections. He  endorses stable energy levels and stable breathing. He denies chest pain or leg swelling. He notes that his RA arthritis continues to be bothersome for him. He denies any new bone pains.  Lab results today (01/21/19) of CBC w/diff and CMP is as follows: all values are WNL except for RBC at 3.40, HCT at 37.3, MCV at 109.7, MCH at 38.2, Glucose at 248, BUN at 24, Creatinine at 1.72, Calcium at 8.5, Total Protein at 5.6, Albumin at 3.4, GFR at 40. 01/24/19 TSH is pending  On review of systems, pt reports stable energy levels, stable breathing, and denies concerns for infections, leg swelling, CP, new bone pains, abdominal pains, and any other symptoms.    REVIEW OF SYSTEMS:    A 10+ POINT REVIEW OF SYSTEMS WAS OBTAINED including neurology, dermatology, psychiatry, cardiac, respiratory, lymph, extremities, GI, GU, Musculoskeletal, constitutional, breasts, reproductive, HEENT.  All pertinent positives are noted in the HPI.  All others are negative.    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) 05/28/2016    . Past Surgical History:  Procedure Laterality Date   CATARACT EXTRACTION Bilateral 2013   CHOLECYSTECTOMY     GASTRECTOMY     tumor removed   LIPOMA EXCISION Right 1999   NEPHRECTOMY Right    PANCREATECTOMY     SPLENECTOMY     THYROIDECTOMY     URETERAL STENT PLACEMENT  Left 2012   WHIPPLE PROCEDURE      . Social History   Tobacco Use   Smoking status: Former Smoker    Years: 1.50   Smokeless tobacco: Never Used  Substance Use Topics   Alcohol use: Yes    Comment: occasional beer   Drug use: No    ALLERGIES:  has No Known Allergies.  MEDICATIONS:  Current Outpatient Medications  Medication Sig Dispense Refill   amLODipine (NORVASC) 10 MG tablet take 1 tablet by mouth once daily (Patient not taking: Reported on 03/31/2018) 90 tablet 3   Ascorbic Acid (VITAMIN C) 1000 MG tablet Take 1,000 mg by mouth daily.     aspirin 81 MG tablet Take 81 mg by mouth daily.     atenolol (TENORMIN) 100 MG tablet      augmented betamethasone dipropionate (DIPROLENE-AF) 0.05 % cream   0   calcium citrate-vitamin D (CITRACAL+D) 315-200 MG-UNIT per tablet Take 2 tablets by mouth daily.      Cyanocobalamin (VITAMIN B-12) 2500 MCG SUBL Take by mouth daily.      docusate sodium (COLACE) 100 MG capsule Take 100 mg by mouth 2 (two) times daily.     dorzolamide-timolol (COSOPT) 22.3-6.8 MG/ML ophthalmic solution place 1 drop into affected eye twice a day  1   famotidine (PEPCID) 20 MG tablet Take 20 mg by mouth 2 (two) times daily.     ferrous sulfate 325 (65 FE) MG tablet Take 325 mg by mouth 2 (two) times daily with a meal.      Glucosamine-MSM-Hyaluronic Acd (JOINT HEALTH) 750-375-30 MG TABS Take 2 tablets by mouth daily.     hydroxychloroquine (PLAQUENIL) 200 MG tablet take 1 tablet by mouth every morning and 1 tablet by mouth every evening (Patient not taking: Reported on 03/31/2018) 180 tablet 1   insulin lispro (HUMALOG) 100 UNIT/ML injection Inject into the skin 3 (three) times daily before meals.     Iron-Vitamins (GERITOL COMPLETE) TABS Take 1 tablet by mouth daily.     Lactobacillus (ACIDOPHILUS) CAPS capsule Take 1 capsule by mouth daily.     latanoprost (XALATAN) 0.005 % ophthalmic solution place 1 drop into both eyes every evening  1    LEVEMIR 100 UNIT/ML injection Inject 7 Units into the skin 2 (two) times daily.   1   levothyroxine (SYNTHROID, LEVOTHROID) 300 MCG tablet Take 300 mcg by mouth 3 (three) times a week.      lovastatin (MEVACOR) 40 MG tablet Take 40 mg by mouth at bedtime.      minoxidil (LONITEN) 10 MG tablet Take 10 mg by mouth 2 (two) times daily.      Multiple Vitamin (MULTIVITAMIN) tablet Take 1 tablet by mouth daily.     Omega-3 Fatty Acids (FISH OIL) 1200 MG CAPS Take 1 capsule by mouth daily.     Pancrelipase, Lip-Prot-Amyl, (CREON) 6000 UNITS CPEP Take 4 capsules by mouth 3 (three) times daily.     potassium citrate (UROCIT-K) 10 MEQ (1080 MG) SR tablet Take 10 mEq by mouth 2 (two) times daily.  SUTENT 37.5 MG capsule TAKE 1 CAPSULE (37.5 MG) BY MOUTH ONCE DAILY FOR 2 WEEKS ON, 1 WEEK OFF, REPEAT EVERY 42 DAYS 28 capsule 1   valsartan-hydrochlorothiazide (DIOVAN-HCT) 160-12.5 MG per tablet      VOLTAREN 1 % GEL apply 3 grams to LARGE JOINTS three times a day if needed  0   No current facility-administered medications for this visit.     PHYSICAL EXAMINATION: ECOG PERFORMANCE STATUS: 1 - Symptomatic but completely ambulatory  Vitals:   01/24/19 0841  BP: 140/69  Pulse: (!) 57  Resp: 17  Temp: 98.3 F (36.8 C)  SpO2: 97%   Filed Weights   01/24/19 0841  Weight: 196 lb 3.2 oz (89 kg)   .Body mass index is 28.15 kg/m.  GENERAL:alert, in no acute distress and comfortable SKIN: no acute rashes, no significant lesions EYES: conjunctiva are pink and non-injected, sclera anicteric OROPHARYNX: MMM, no exudates, no oropharyngeal erythema or ulceration NECK: supple, no JVD LYMPH:  no palpable lymphadenopathy in the cervical, axillary or inguinal regions LUNGS: clear to auscultation b/l with normal respiratory effort HEART: regular rate & rhythm ABDOMEN:  normoactive bowel sounds , non tender, not distended. No palpable hepatosplenomegaly.  Extremity: no pedal edema PSYCH: alert &  oriented x 3 with fluent speech NEURO: no focal motor/sensory deficits   LABORATORY DATA:   I have reviewed the data as listed  . CBC Latest Ref Rng & Units 01/24/2019 09/09/2018 06/21/2018  WBC 4.0 - 10.5 K/uL 4.8 3.9(L) 4.4  Hemoglobin 13.0 - 17.0 g/dL 13.0 14.0 12.5(L)  Hematocrit 39.0 - 52.0 % 37.3(L) 40.3 36.1(L)  Platelets 150 - 400 K/uL 215 131(L) 205  ANC 1.4k . CBC    Component Value Date/Time   WBC 4.8 01/24/2019 0805   RBC 3.40 (L) 01/24/2019 0805   HGB 13.0 01/24/2019 0805   HGB 13.4 12/17/2017 0735   HGB 14.0 06/11/2017 0808   HCT 37.3 (L) 01/24/2019 0805   HCT 40.7 06/11/2017 0808   PLT 215 01/24/2019 0805   PLT 198 12/17/2017 0735   PLT 223 06/11/2017 0808   MCV 109.7 (H) 01/24/2019 0805   MCV 112.1 (H) 06/11/2017 0808   MCH 38.2 (H) 01/24/2019 0805   MCHC 34.9 01/24/2019 0805   RDW 13.0 01/24/2019 0805   RDW 13.9 06/11/2017 0808   LYMPHSABS 1.6 01/24/2019 0805   LYMPHSABS 1.6 06/11/2017 0808   MONOABS 0.6 01/24/2019 0805   MONOABS 0.5 06/11/2017 0808   EOSABS 0.1 01/24/2019 0805   EOSABS 0.1 06/11/2017 0808   BASOSABS 0.0 01/24/2019 0805   BASOSABS 0.0 06/11/2017 0808   . CMP Latest Ref Rng & Units 01/24/2019 09/09/2018 06/21/2018  Glucose 70 - 99 mg/dL 248(H) 138(H) 198(H)  BUN 8 - 23 mg/dL 24(H) 14 26(H)  Creatinine 0.61 - 1.24 mg/dL 1.72(H) 1.73(H) 1.75(H)  Sodium 135 - 145 mmol/L 140 143 142  Potassium 3.5 - 5.1 mmol/L 3.8 3.9 4.5  Chloride 98 - 111 mmol/L 108 110 109  CO2 22 - 32 mmol/L 23 26 26   Calcium 8.9 - 10.3 mg/dL 8.5(L) 8.5(L) 8.4(L)  Total Protein 6.5 - 8.1 g/dL 5.6(L) 5.9(L) 5.3(L)  Total Bilirubin 0.3 - 1.2 mg/dL 1.0 1.4(H) 1.6(H)  Alkaline Phos 38 - 126 U/L 112 92 103  AST 15 - 41 U/L 30 30 36  ALT 0 - 44 U/L 40 32 35    RADIOGRAPHIC STUDIES: I have personally reviewed the radiological images as listed and agreed with the findings in the report.  No results found.   ASSESSMENT & PLAN:   68 y.o. Caucasian male with  #1  Recurrent metastatic Renal cell carcinoma with multiple lung metastases.  Has been on stable on 1st line Sutent for >47yr. Has had SBRT to the dominant right lung nodule with partial response. No lab or clinical evidence of metastatic RCC progression at this time.  PET/CT 08/08/2016 with a couple of mildly enlarge LLL pulmonary nodules - no other overt evidence of RCC progression at this time.  PET CT scan 11/06/2016 - no overt evidence of RCC progression at this time .  PET/CT 03/13/2017 - no overt evidence of RCC progression at this time .  PET/CT 08/10/2017 - no overt evidence of RCC progression at this time .  PET/ CT done 08/10/2017, shows no evidence of  Progression at this time.  12/07/17 PET which revealed No findings suspicious for recurrent or metastatic disease.  03/22/18 PET/CT revealed One of the left lower lobe nodules has very minimally enlarged over the last 2 years, currently 0.6 by 0.4 cm and measuring 0.4 by 0.3 cm on 03/20/2016. This probably represents a previously treated metastatic nodule given that it measured 1.0 by 0.8 cm on 06/06/2015. This nodule may be subject to some slice selection phenomenon and also motion artifact given proximity to the diaphragm. Surveillance is likely warranted. 2. Chronic central lucency, marginal bony deposition, and slow healing of the right lateral fourth rib fracture. Possibilities may include underlying radiation necrosis as a cause for fracture leading to slow healing, or a low-grade pathologic lesion. Maximum SUV at this fracture site is 2.9, previously 2.4. 3. 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 There is only low-grade activity in this vicinity characteristic of radiation fibrosis, without focal activity. 4. Other imaging findings of potential clinical significance: Aortic Atherosclerosis. Coronary atherosclerosis. Mild cardiomegaly. Right nephrectomy. 8 mm  nonobstructive calculus in the left kidney lower pole.   08/24/18 CT C/A/P revealed "Stable small left lower lobe pulmonary nodules from recent prior studies. As previously noted, these have decreased in size from PET-CT 06/06/2015, likely treated metastases. No new or enlarging pulmonary nodules. 2. Postsurgical changes in the upper abdomen without evidence of local recurrence or metastatic disease. 3. Nonobstructing left renal calculus. 4. 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 Confirmed by [place].  #4 mild thrombocytopenia likely related to Sutent- resolved with dose reduction. #5 Abnormal Liver function test - significant bump in transaminases. -resolved on labs today #6 grade 1 fatigue due to Sutent -better with dose reduction and changing schedule to 2 week on and 1 week. #7 Dysguesia from Sutent - manageable per patient. He is trying to eat as well as he can and is maintaining his body weight.  #8 RBC macrocytosis due to Sutent #9 chronic kidney disease/single kidney - creatinine stable @ 1.5-1.9  Recommended to avoid NSAIDS and other nephrotoxic medications. Optimize control of DM2 #10 rheumatoid arthritis -follows with Dr. Estanislado Pandy for mx #11 DM2 -continue f/u with Dr Philip Aspen for continue mx, blood sugar at home per patient.   PLAN: -Discussed pt labwork today, 01/21/19; blood counts and chemistries are stable -The pt shows no clinical or lab progression/return of his RCC at this time.  -The pt has no prohibitive toxicities from continuing Sutent  at this time. -Will repeat imaging prior to next visit -Advised that the patient is able to continue on 81 mg ASA as per his PCP, Dr. Shon Baton recommendations.  -Continue follow up with PCP and rheumatology for RA management - appears to be having a flare off plaquenil.  In light of CKD NSAIDS not appropriate, DM2 limits significant use of steroids. Would need to choose least immunosuppressive route given RCC is an immunosupression sensitive malignancy. -Will see the pt back in 7 weeks   CT chest/abd in 6 weeks RTC with Dr Irene Limbo in 7 weeks   The total time spent in the appt was 20 minutes and more than 50% was on counseling and direct patient cares.   Sullivan Lone MD [place] AAHIVMS [facility] [place] Hematology/Oncology Physician [facility]  (Office):       [phone] (Work cell):  [phone] (Fax):           [phone]  I, Baldwin [place], am acting as a scribe for Dr. Sullivan Lone.   .I have reviewed the above documentation for accuracy and completeness, and I agree with the above. Brunetta Genera MD

## 2019-01-21 ENCOUNTER — Telehealth: Payer: Self-pay

## 2019-01-21 NOTE — Telephone Encounter (Signed)
Called and left voicemail regarding pre-screening questions for appt on 6/22  

## 2019-01-24 ENCOUNTER — Telehealth: Payer: Self-pay | Admitting: Hematology

## 2019-01-24 ENCOUNTER — Other Ambulatory Visit: Payer: Self-pay

## 2019-01-24 ENCOUNTER — Inpatient Hospital Stay: Payer: Medicare Other

## 2019-01-24 ENCOUNTER — Inpatient Hospital Stay: Payer: Medicare Other | Attending: Hematology | Admitting: Hematology

## 2019-01-24 VITALS — BP 140/69 | HR 57 | Temp 98.3°F | Resp 17 | Ht 70.0 in | Wt 196.2 lb

## 2019-01-24 DIAGNOSIS — Z794 Long term (current) use of insulin: Secondary | ICD-10-CM | POA: Insufficient documentation

## 2019-01-24 DIAGNOSIS — Z79899 Other long term (current) drug therapy: Secondary | ICD-10-CM | POA: Insufficient documentation

## 2019-01-24 DIAGNOSIS — E039 Hypothyroidism, unspecified: Secondary | ICD-10-CM

## 2019-01-24 DIAGNOSIS — D696 Thrombocytopenia, unspecified: Secondary | ICD-10-CM

## 2019-01-24 DIAGNOSIS — C649 Malignant neoplasm of unspecified kidney, except renal pelvis: Secondary | ICD-10-CM | POA: Insufficient documentation

## 2019-01-24 DIAGNOSIS — I10 Essential (primary) hypertension: Secondary | ICD-10-CM | POA: Diagnosis not present

## 2019-01-24 DIAGNOSIS — C7900 Secondary malignant neoplasm of unspecified kidney and renal pelvis: Secondary | ICD-10-CM

## 2019-01-24 DIAGNOSIS — N189 Chronic kidney disease, unspecified: Secondary | ICD-10-CM

## 2019-01-24 DIAGNOSIS — Z7982 Long term (current) use of aspirin: Secondary | ICD-10-CM

## 2019-01-24 DIAGNOSIS — E119 Type 2 diabetes mellitus without complications: Secondary | ICD-10-CM | POA: Diagnosis not present

## 2019-01-24 DIAGNOSIS — C7800 Secondary malignant neoplasm of unspecified lung: Secondary | ICD-10-CM | POA: Diagnosis not present

## 2019-01-24 DIAGNOSIS — Z905 Acquired absence of kidney: Secondary | ICD-10-CM

## 2019-01-24 LAB — CBC WITH DIFFERENTIAL/PLATELET
Abs Immature Granulocytes: 0.02 10*3/uL (ref 0.00–0.07)
Basophils Absolute: 0 10*3/uL (ref 0.0–0.1)
Basophils Relative: 0 %
Eosinophils Absolute: 0.1 10*3/uL (ref 0.0–0.5)
Eosinophils Relative: 1 %
HCT: 37.3 % — ABNORMAL LOW (ref 39.0–52.0)
Hemoglobin: 13 g/dL (ref 13.0–17.0)
Immature Granulocytes: 0 %
Lymphocytes Relative: 32 %
Lymphs Abs: 1.6 10*3/uL (ref 0.7–4.0)
MCH: 38.2 pg — ABNORMAL HIGH (ref 26.0–34.0)
MCHC: 34.9 g/dL (ref 30.0–36.0)
MCV: 109.7 fL — ABNORMAL HIGH (ref 80.0–100.0)
Monocytes Absolute: 0.6 10*3/uL (ref 0.1–1.0)
Monocytes Relative: 12 %
Neutro Abs: 2.6 10*3/uL (ref 1.7–7.7)
Neutrophils Relative %: 55 %
Platelets: 215 10*3/uL (ref 150–400)
RBC: 3.4 MIL/uL — ABNORMAL LOW (ref 4.22–5.81)
RDW: 13 % (ref 11.5–15.5)
WBC: 4.8 10*3/uL (ref 4.0–10.5)
nRBC: 0 % (ref 0.0–0.2)

## 2019-01-24 LAB — CMP (CANCER CENTER ONLY)
ALT: 40 U/L (ref 0–44)
AST: 30 U/L (ref 15–41)
Albumin: 3.4 g/dL — ABNORMAL LOW (ref 3.5–5.0)
Alkaline Phosphatase: 112 U/L (ref 38–126)
Anion gap: 9 (ref 5–15)
BUN: 24 mg/dL — ABNORMAL HIGH (ref 8–23)
CO2: 23 mmol/L (ref 22–32)
Calcium: 8.5 mg/dL — ABNORMAL LOW (ref 8.9–10.3)
Chloride: 108 mmol/L (ref 98–111)
Creatinine: 1.72 mg/dL — ABNORMAL HIGH (ref 0.61–1.24)
GFR, Est AFR Am: 46 mL/min — ABNORMAL LOW (ref >60)
GFR, Estimated: 40 mL/min — ABNORMAL LOW (ref >60)
Glucose, Bld: 248 mg/dL — ABNORMAL HIGH (ref 70–99)
Potassium: 3.8 mmol/L (ref 3.5–5.1)
Sodium: 140 mmol/L (ref 135–145)
Total Bilirubin: 1 mg/dL (ref 0.3–1.2)
Total Protein: 5.6 g/dL — ABNORMAL LOW (ref 6.5–8.1)

## 2019-01-24 LAB — TSH: TSH: 17.962 u[IU]/mL — ABNORMAL HIGH (ref 0.320–4.118)

## 2019-01-24 LAB — T4, FREE: Free T4: 0.94 ng/dL (ref 0.61–1.12)

## 2019-01-24 NOTE — Telephone Encounter (Signed)
Scheduled appt per 6/22 los. Spoke with patient and he is aware of appt date and time.  Printed and mailed calendar per patient request

## 2019-02-07 ENCOUNTER — Other Ambulatory Visit: Payer: Self-pay | Admitting: Hematology

## 2019-02-07 DIAGNOSIS — C649 Malignant neoplasm of unspecified kidney, except renal pelvis: Secondary | ICD-10-CM

## 2019-02-08 MED FILL — SUTENT 37.5 MG CAPSULE: 37.5 | 42 days supply | Qty: 28 | Fill #0

## 2019-02-22 ENCOUNTER — Other Ambulatory Visit: Payer: Self-pay | Admitting: Hematology

## 2019-02-22 DIAGNOSIS — C649 Malignant neoplasm of unspecified kidney, except renal pelvis: Secondary | ICD-10-CM

## 2019-02-24 ENCOUNTER — Telehealth: Payer: Self-pay | Admitting: Hematology

## 2019-02-24 ENCOUNTER — Other Ambulatory Visit: Payer: Self-pay | Admitting: *Deleted

## 2019-02-24 DIAGNOSIS — C649 Malignant neoplasm of unspecified kidney, except renal pelvis: Secondary | ICD-10-CM

## 2019-02-24 NOTE — Telephone Encounter (Signed)
Scheduled appt per 7/23 sch message - unable to reach pt . Left message with appt date and time

## 2019-02-28 ENCOUNTER — Inpatient Hospital Stay: Payer: Medicare Other | Attending: Hematology

## 2019-02-28 ENCOUNTER — Other Ambulatory Visit: Payer: Self-pay

## 2019-02-28 DIAGNOSIS — I10 Essential (primary) hypertension: Secondary | ICD-10-CM | POA: Insufficient documentation

## 2019-02-28 DIAGNOSIS — N189 Chronic kidney disease, unspecified: Secondary | ICD-10-CM | POA: Diagnosis not present

## 2019-02-28 DIAGNOSIS — E039 Hypothyroidism, unspecified: Secondary | ICD-10-CM | POA: Diagnosis not present

## 2019-02-28 DIAGNOSIS — Z79899 Other long term (current) drug therapy: Secondary | ICD-10-CM | POA: Diagnosis not present

## 2019-02-28 DIAGNOSIS — E119 Type 2 diabetes mellitus without complications: Secondary | ICD-10-CM | POA: Insufficient documentation

## 2019-02-28 DIAGNOSIS — Z794 Long term (current) use of insulin: Secondary | ICD-10-CM | POA: Insufficient documentation

## 2019-02-28 DIAGNOSIS — C7800 Secondary malignant neoplasm of unspecified lung: Secondary | ICD-10-CM | POA: Diagnosis present

## 2019-02-28 DIAGNOSIS — Z905 Acquired absence of kidney: Secondary | ICD-10-CM | POA: Diagnosis not present

## 2019-02-28 DIAGNOSIS — C649 Malignant neoplasm of unspecified kidney, except renal pelvis: Secondary | ICD-10-CM | POA: Diagnosis present

## 2019-02-28 DIAGNOSIS — D696 Thrombocytopenia, unspecified: Secondary | ICD-10-CM | POA: Diagnosis not present

## 2019-02-28 LAB — CMP (CANCER CENTER ONLY)
ALT: 33 U/L (ref 0–44)
AST: 27 U/L (ref 15–41)
Albumin: 3.2 g/dL — ABNORMAL LOW (ref 3.5–5.0)
Alkaline Phosphatase: 103 U/L (ref 38–126)
Anion gap: 5 (ref 5–15)
BUN: 14 mg/dL (ref 8–23)
CO2: 30 mmol/L (ref 22–32)
Calcium: 8.5 mg/dL — ABNORMAL LOW (ref 8.9–10.3)
Chloride: 104 mmol/L (ref 98–111)
Creatinine: 1.83 mg/dL — ABNORMAL HIGH (ref 0.61–1.24)
GFR, Est AFR Am: 43 mL/min — ABNORMAL LOW (ref >60)
GFR, Estimated: 37 mL/min — ABNORMAL LOW (ref >60)
Glucose, Bld: 375 mg/dL — ABNORMAL HIGH (ref 70–99)
Potassium: 4.8 mmol/L (ref 3.5–5.1)
Sodium: 139 mmol/L (ref 135–145)
Total Bilirubin: 1 mg/dL (ref 0.3–1.2)
Total Protein: 5.4 g/dL — ABNORMAL LOW (ref 6.5–8.1)

## 2019-02-28 LAB — CBC WITH DIFFERENTIAL (CANCER CENTER ONLY)
Abs Immature Granulocytes: 0 10*3/uL (ref 0.00–0.07)
Basophils Absolute: 0 10*3/uL (ref 0.0–0.1)
Basophils Relative: 1 %
Eosinophils Absolute: 0.1 10*3/uL (ref 0.0–0.5)
Eosinophils Relative: 2 %
HCT: 39.2 % (ref 39.0–52.0)
Hemoglobin: 13.3 g/dL (ref 13.0–17.0)
Immature Granulocytes: 0 %
Lymphocytes Relative: 45 %
Lymphs Abs: 1.8 10*3/uL (ref 0.7–4.0)
MCH: 38.1 pg — ABNORMAL HIGH (ref 26.0–34.0)
MCHC: 33.9 g/dL (ref 30.0–36.0)
MCV: 112.3 fL — ABNORMAL HIGH (ref 80.0–100.0)
Monocytes Absolute: 0.4 10*3/uL (ref 0.1–1.0)
Monocytes Relative: 11 %
Neutro Abs: 1.6 10*3/uL — ABNORMAL LOW (ref 1.7–7.7)
Neutrophils Relative %: 41 %
Platelet Count: 173 10*3/uL (ref 150–400)
RBC: 3.49 MIL/uL — ABNORMAL LOW (ref 4.22–5.81)
RDW: 12.9 % (ref 11.5–15.5)
WBC Count: 4 10*3/uL (ref 4.0–10.5)
nRBC: 0 % (ref 0.0–0.2)

## 2019-03-07 ENCOUNTER — Encounter (HOSPITAL_COMMUNITY): Payer: Self-pay

## 2019-03-07 ENCOUNTER — Ambulatory Visit (HOSPITAL_COMMUNITY)
Admission: RE | Admit: 2019-03-07 | Discharge: 2019-03-07 | Disposition: A | Discharge status: Home / Self Care | Payer: Medicare Other | Source: Ambulatory Visit | Attending: Hematology | Admitting: Hematology

## 2019-03-07 ENCOUNTER — Other Ambulatory Visit: Payer: Self-pay

## 2019-03-07 DIAGNOSIS — R918 Other nonspecific abnormal finding of lung field: Secondary | ICD-10-CM | POA: Diagnosis not present

## 2019-03-07 DIAGNOSIS — I700 Atherosclerosis of aorta: Secondary | ICD-10-CM | POA: Diagnosis not present

## 2019-03-07 DIAGNOSIS — C7800 Secondary malignant neoplasm of unspecified lung: Secondary | ICD-10-CM | POA: Diagnosis present

## 2019-03-07 DIAGNOSIS — N200 Calculus of kidney: Secondary | ICD-10-CM | POA: Insufficient documentation

## 2019-03-07 DIAGNOSIS — C649 Malignant neoplasm of unspecified kidney, except renal pelvis: Secondary | ICD-10-CM | POA: Diagnosis not present

## 2019-03-09 NOTE — Progress Notes (Signed)
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 2) Remote history of metastatic renal cell carcinoma Diagnosed with renal cell carcinoma 20 years ago and had a right nephrectomy. About 8 years after that he was noted to have abdominal recurrence in his pancreas spleen and small intestine and had a Whipple's procedure and significant abdominal surgery and notes that 10 out of 17 lymph nodes were positive. He also had his gallbladder removed. Postoperative course was complicated by an internal hemorrhage as per his report. Patient notes 2-3 years after that he had recurrence in his thyroid that led to a thyroidectomy. [June 2004] later he had partial gastrectomy for local recurrence. [February 2005] when he presented with GI bleeding. Patient notes that he has had no known evidence of recurrence over the last 8 years until his recent CT scan showed lung nodules.  Current Treatment: Sutent 37.5 mg po daily for 2 weeks on and 1 weeks off.  Previous treatment Sutent on cycle 1 - 50 mg by mouth daily for 4 weeks on and 2-weeks off. It was dose adjusted to 37.5 mg by mouth daily for 2 weeks with 1 week of to help mitigate issues with fatigue, mild hyperbilirubinemia, cytopenias. SBRT to dominant RML nodule.  INTERVAL HISTORY:  Dylan Jefferson is here for for his scheduled follow-up of his metastatic renal cell carcinoma. The patient's last visit with [place] was on 01/24/2019. The pt reports that he is doing well overall.  The pt reports that his rheumatoid arthritis and osteoporosis are causing him pain. He is not taking Plaquinil  anymore.  He notes that if he works in the yard for a few hours, he is fatigued for the next two days and cannot leave his bed. He reports that last time he worked in the garden, he experienced pain in his right clavicular region. He wore a sling for two days because the pain was so bad. He also used ice packs, which helped.  Of note since the patient's last visit, pt has had CT C/A wo contrast completed on 03/07/2019 with results revealing "1. Stable left lower lobe pulmonary nodules, likely treated Metastases. 2. No additional evidence of metastatic disease. 3. Left renal stone. 4. Post radiation scarring in the right hemithorax with a healed adjacent right rib fracture which is presumably pathologic in Etiology. 5.  Aortic atherosclerosis (ICD10-170.0). 6. Enlarged pulmonic trunk, indicative of pulmonary arterial Hypertension."  Most recent lab results from 02/28/2019 of CBC w/diff and CMP is as follows: all values are WNL except for RBC at 3.49, MCV at 112.3, MCH at 38.1, neutro abs at 1.6k, glucose bld at 375, Creatinine at 1.83, Calcium at 8.5, Total protein at 5.4, Albumin at 3.2, GFR at 37  On review of systems, pt reports right clavicular pain, and denies any other symptoms.   REVIEW OF SYSTEMS:    A 10+ POINT REVIEW OF SYSTEMS WAS OBTAINED including neurology, dermatology, psychiatry, cardiac, respiratory, lymph, extremities, GI, GU, Musculoskeletal, constitutional, breasts, reproductive, HEENT.  All pertinent positives are noted in the HPI.  All others are negative.     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 Past Surgical History:  Procedure Laterality Date   CATARACT EXTRACTION Bilateral 2013   CHOLECYSTECTOMY     GASTRECTOMY     tumor removed   LIPOMA EXCISION Right 1999   NEPHRECTOMY Right    PANCREATECTOMY     SPLENECTOMY     THYROIDECTOMY     URETERAL STENT PLACEMENT Left 2012   WHIPPLE PROCEDURE      . Social History   Tobacco Use   Smoking status: Former Smoker    Years: 1.50   Smokeless tobacco: Never Used  Substance Use Topics   Alcohol use: Yes    Comment: occasional beer   Drug use: No    ALLERGIES:  has No Known Allergies.  MEDICATIONS:  Current Outpatient Medications  Medication Sig Dispense Refill   amLODipine (NORVASC) 10 MG tablet take 1 tablet by mouth once daily (Patient not taking: Reported on 03/31/2018) 90 tablet 3   Ascorbic Acid (VITAMIN C) 1000 MG tablet Take 1,000 mg by mouth daily.     aspirin 81 MG tablet Take 81 mg by mouth daily.     atenolol (TENORMIN) 100 MG tablet      augmented betamethasone dipropionate (DIPROLENE-AF) 0.05 % cream   0   calcium citrate-vitamin D (CITRACAL+D) 315-200 MG-UNIT per tablet Take 2 tablets by mouth daily.      Cyanocobalamin (VITAMIN B-12) 2500 MCG SUBL Take by mouth daily.      docusate sodium (COLACE) 100 MG capsule Take 100 mg by mouth 2 (two) times daily.     dorzolamide-timolol (COSOPT) 22.3-6.8 MG/ML ophthalmic solution place 1 drop into affected eye twice a day  1   famotidine (PEPCID) 20 MG tablet Take 20 mg by mouth 2 (two) times daily.     ferrous sulfate 325 (65 FE) MG tablet Take 325 mg by mouth 2 (two) times daily with a meal.      Glucosamine-MSM-Hyaluronic Acd (JOINT HEALTH) 750-375-30 MG TABS Take 2 tablets by mouth  daily.     insulin lispro (HUMALOG) 100 UNIT/ML injection Inject into the skin 3 (three) times daily before meals.     Iron-Vitamins (GERITOL COMPLETE) TABS Take 1 tablet by mouth daily.     Lactobacillus (ACIDOPHILUS) CAPS capsule Take 1 capsule by mouth daily.     latanoprost (XALATAN) 0.005 % ophthalmic solution place 1 drop into both eyes every evening  1   LEVEMIR 100 UNIT/ML injection Inject 7 Units into the skin 2 (two) times daily.   1   levothyroxine (SYNTHROID, LEVOTHROID) 300 MCG tablet Take 300 mcg by mouth 3 (three) times a week.      lovastatin (MEVACOR) 40 MG tablet Take 40 mg by mouth at bedtime.      minoxidil (LONITEN) 10 MG tablet Take 10 mg by mouth 2 (two) times daily.      Multiple Vitamin (MULTIVITAMIN) tablet Take 1 tablet by mouth daily.  Omega-3 Fatty Acids (FISH OIL) 1200 MG CAPS Take 1 capsule by mouth daily.     Pancrelipase, Lip-Prot-Amyl, (CREON) 6000 UNITS CPEP Take 4 capsules by mouth 3 (three) times daily.     potassium citrate (UROCIT-K) 10 MEQ (1080 MG) SR tablet Take 10 mEq by mouth 2 (two) times daily.      SUTENT 37.5 MG capsule TAKE 1 CAPSULE (37.5 MG) BY MOUTH ONCE DAILY FOR 2 WEEKS ON, 1 WEEK OFF, REPEAT EVERY 42 DAYS 28 capsule 1   valsartan-hydrochlorothiazide (DIOVAN-HCT) 160-12.5 MG per tablet      VOLTAREN 1 % GEL apply 3 grams to LARGE JOINTS three times a day if needed  0   No current facility-administered medications for this visit.     PHYSICAL EXAMINATION: ECOG PERFORMANCE STATUS: 1 - Symptomatic but completely ambulatory  There were no vitals filed for this visit. There were no vitals filed for this visit. .There is no height or weight on file to calculate BMI.  GENERAL:alert, in no acute distress and comfortable SKIN: no acute rashes, no significant lesions EYES: conjunctiva are pink and non-injected, sclera anicteric OROPHARYNX: MMM, no exudates, no oropharyngeal erythema or ulceration NECK: supple, no JVD LYMPH:   no palpable lymphadenopathy in the cervical, axillary or inguinal regions LUNGS: clear to auscultation b/l with normal respiratory effort HEART: regular rate & rhythm ABDOMEN:  normoactive bowel sounds , non tender, not distended. No palpable hepatosplenomegaly.  Extremity: no pedal edema PSYCH: alert & oriented x 3 with fluent speech NEURO: no focal motor/sensory deficits   LABORATORY DATA:   I have reviewed the data as listed  . CBC Latest Ref Rng & Units 02/28/2019 01/24/2019 09/09/2018  WBC 4.0 - 10.5 K/uL 4.0 4.8 3.9(L)  Hemoglobin 13.0 - 17.0 g/dL 13.3 13.0 14.0  Hematocrit 39.0 - 52.0 % 39.2 37.3(L) 40.3  Platelets 150 - 400 K/uL 173 215 131(L)  ANC 1.4k . CBC    Component Value Date/Time   WBC 4.0 02/28/2019 1243   WBC 4.8 01/24/2019 0805   RBC 3.49 (L) 02/28/2019 1243   HGB 13.3 02/28/2019 1243   HGB 14.0 06/11/2017 0808   HCT 39.2 02/28/2019 1243   HCT 40.7 06/11/2017 0808   PLT 173 02/28/2019 1243   PLT 223 06/11/2017 0808   MCV 112.3 (H) 02/28/2019 1243   MCV 112.1 (H) 06/11/2017 0808   MCH 38.1 (H) 02/28/2019 1243   MCHC 33.9 02/28/2019 1243   RDW 12.9 02/28/2019 1243   RDW 13.9 06/11/2017 0808   LYMPHSABS 1.8 02/28/2019 1243   LYMPHSABS 1.6 06/11/2017 0808   MONOABS 0.4 02/28/2019 1243   MONOABS 0.5 06/11/2017 0808   EOSABS 0.1 02/28/2019 1243   EOSABS 0.1 06/11/2017 0808   BASOSABS 0.0 02/28/2019 1243   BASOSABS 0.0 06/11/2017 0808   . CMP Latest Ref Rng & Units 02/28/2019 01/24/2019 09/09/2018  Glucose 70 - 99 mg/dL 375(H) 248(H) 138(H)  BUN 8 - 23 mg/dL 14 24(H) 14  Creatinine 0.61 - 1.24 mg/dL 1.83(H) 1.72(H) 1.73(H)  Sodium 135 - 145 mmol/L 139 140 143  Potassium 3.5 - 5.1 mmol/L 4.8 3.8 3.9  Chloride 98 - 111 mmol/L 104 108 110  CO2 22 - 32 mmol/L 30 23 26   Calcium 8.9 - 10.3 mg/dL 8.5(L) 8.5(L) 8.5(L)  Total Protein 6.5 - 8.1 g/dL 5.4(L) 5.6(L) 5.9(L)  Total Bilirubin 0.3 - 1.2 mg/dL 1.0 1.0 1.4(H)  Alkaline Phos 38 - 126 U/L 103 112 92  AST  15 - 41 U/L 27  30 30  ALT 0 - 44 U/L 33 40 32    RADIOGRAPHIC STUDIES: I have personally reviewed the radiological images as listed and agreed with the findings in the report. Ct Abdomen Wo Contrast  Result Date: 03/07/2019 CLINICAL DATA:  Renal cell carcinoma. History of thyroid cancer. Right upper chest pain after yard work 1 month ago. EXAM: CT CHEST AND ABDOMEN WITHOUT CONTRAST TECHNIQUE: Multidetector CT imaging of the chest and abdomen was performed following the standard protocol without intravenous contrast. COMPARISON:  08/24/2018. FINDINGS: CT CHEST FINDINGS WITHOUT CONTRAST Cardiovascular: Mild atherosclerotic calcification of the aorta and coronary arteries. Pulmonic trunk is enlarged. Heart is at the upper limits of normal in size to mildly enlarged. No pericardial effusion. Mediastinum/Nodes: Thyroidectomy. No pathologically enlarged mediastinal or axillary lymph nodes. Hilar regions are difficult to evaluate without IV contrast. Esophagus is grossly unremarkable. Lungs/Pleura: Scarring along the minor fissure and right major fissure is again seen, post radiation therapy. Associated mild bronchiectasis and volume loss. Calcified granuloma in the right upper lobe. Left lower lobe nodules measure up to 4 mm (series 6, image 137), stable. No pleural fluid. Airway is unremarkable. Musculoskeletal: Healed fracture of the right fourth anterolateral right rib with underlying lucent lesion, as before. No new worrisome lytic or sclerotic lesions. CT ABDOMEN FINDINGS WITHOUT CONTRAST Hepatobiliary: Pneumobilia, as before. Liver is otherwise unremarkable. Cholecystectomy. Pancreas: Pancreaticoduodenectomy. Remaining pancreas appears atrophic. Spleen: Surgically absent. Adrenals/Urinary Tract: Adrenal glands are unremarkable. Right nephrectomy. A stone is seen in the lower pole left kidney. Subcentimeter low-attenuation lesion in the interpolar left kidney is too small to characterize. Stomach/Bowel:  Partial gastrectomy. Stomach is otherwise unremarkable. Visualized portions of the small bowel and colon are otherwise unremarkable. Vascular/Lymphatic: Atherosclerotic calcification of the aorta without aneurysm. No pathologically enlarged lymph nodes. Other: No free fluid.  Mesenteries and peritoneum are unremarkable. Musculoskeletal: No worrisome lytic or sclerotic lesions. IMPRESSION: 1. Stable left lower lobe pulmonary nodules, likely treated metastases. 2. No additional evidence of metastatic disease. 3. Left renal stone. 4. Post radiation scarring in the right hemithorax with a healed adjacent right rib fracture which is presumably pathologic in etiology. 5.  Aortic atherosclerosis (ICD10-170.0). 6. Enlarged pulmonic trunk, indicative of pulmonary arterial hypertension. Electronically Signed   By: Lorin Picket M.D.   On: 03/07/2019 08:03   Ct Chest Wo Contrast  Result Date: 03/07/2019 CLINICAL DATA:  Renal cell carcinoma. History of thyroid cancer. Right upper chest pain after yard work 1 month ago. EXAM: CT CHEST AND ABDOMEN WITHOUT CONTRAST TECHNIQUE: Multidetector CT imaging of the chest and abdomen was performed following the standard protocol without intravenous contrast. COMPARISON:  08/24/2018. FINDINGS: CT CHEST FINDINGS WITHOUT CONTRAST Cardiovascular: Mild atherosclerotic calcification of the aorta and coronary arteries. Pulmonic trunk is enlarged. Heart is at the upper limits of normal in size to mildly enlarged. No pericardial effusion. Mediastinum/Nodes: Thyroidectomy. No pathologically enlarged mediastinal or axillary lymph nodes. Hilar regions are difficult to evaluate without IV contrast. Esophagus is grossly unremarkable. Lungs/Pleura: Scarring along the minor fissure and right major fissure is again seen, post radiation therapy. Associated mild bronchiectasis and volume loss. Calcified granuloma in the right upper lobe. Left lower lobe nodules measure up to 4 mm (series 6, image 137),  stable. No pleural fluid. Airway is unremarkable. Musculoskeletal: Healed fracture of the right fourth anterolateral right rib with underlying lucent lesion, as before. No new worrisome lytic or sclerotic lesions. CT ABDOMEN FINDINGS WITHOUT CONTRAST Hepatobiliary: Pneumobilia, as before. Liver is otherwise unremarkable. Cholecystectomy. Pancreas: Pancreaticoduodenectomy.  Remaining pancreas appears atrophic. Spleen: Surgically absent. Adrenals/Urinary Tract: Adrenal glands are unremarkable. Right nephrectomy. A stone is seen in the lower pole left kidney. Subcentimeter low-attenuation lesion in the interpolar left kidney is too small to characterize. Stomach/Bowel: Partial gastrectomy. Stomach is otherwise unremarkable. Visualized portions of the small bowel and colon are otherwise unremarkable. Vascular/Lymphatic: Atherosclerotic calcification of the aorta without aneurysm. No pathologically enlarged lymph nodes. Other: No free fluid.  Mesenteries and peritoneum are unremarkable. Musculoskeletal: No worrisome lytic or sclerotic lesions. IMPRESSION: 1. Stable left lower lobe pulmonary nodules, likely treated metastases. 2. No additional evidence of metastatic disease. 3. Left renal stone. 4. Post radiation scarring in the right hemithorax with a healed adjacent right rib fracture which is presumably pathologic in etiology. 5.  Aortic atherosclerosis (ICD10-170.0). 6. Enlarged pulmonic trunk, indicative of pulmonary arterial hypertension. Electronically Signed   By: Lorin Picket M.D.   On: 03/07/2019 08:03     ASSESSMENT & PLAN:   68 y.o. Caucasian male with  #1 Recurrent metastatic Renal cell carcinoma with multiple lung metastases.  Has been on stable on 1st line Sutent for >35yr. Has had SBRT to the dominant right lung nodule with partial response. No lab or clinical evidence of metastatic RCC progression at this time.  PET/CT 08/08/2016 with a couple of mildly enlarge LLL pulmonary nodules - no other  overt evidence of RCC progression at this time.  PET CT scan 11/06/2016 - no overt evidence of RCC progression at this time .  PET/CT 03/13/2017 - no overt evidence of RCC progression at this time .  PET/CT 08/10/2017 - no overt evidence of RCC progression at this time .  PET/ CT done 08/10/2017, shows no evidence of  Progression at this time.  12/07/17 PET which revealed No findings suspicious for recurrent or metastatic disease.  03/22/18 PET/CT revealed One of the left lower lobe nodules has very minimally enlarged over the last 2 years, currently 0.6 by 0.4 cm and measuring 0.4 by 0.3 cm on 03/20/2016. This probably represents a previously treated metastatic nodule given that it measured 1.0 by 0.8 cm on 06/06/2015. This nodule may be subject to some slice selection phenomenon and also motion artifact given proximity to the diaphragm. Surveillance is likely warranted. 2. Chronic central lucency, marginal bony deposition, and slow healing of the right lateral fourth rib fracture. Possibilities may include underlying radiation necrosis as a cause for fracture leading to slow healing, or a low-grade pathologic lesion. Maximum SUV at this fracture site is 2.9, previously 2.4. 3. 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 There is only low-grade activity in this vicinity characteristic of radiation fibrosis, without focal activity. 4. Other imaging findings of potential clinical significance: Aortic Atherosclerosis. Coronary atherosclerosis. Mild cardiomegaly. Right nephrectomy. 8 mm nonobstructive calculus in the left kidney lower pole.   08/24/18 CT C/A/P revealed "Stable small left lower lobe pulmonary nodules from recent prior studies. As previously noted, these have decreased in size from PET-CT 06/06/2015, likely treated metastases. No new or enlarging pulmonary nodules. 2. Postsurgical changes in the upper abdomen without evidence  of local recurrence or metastatic disease. 3. Nonobstructing left renal calculus. 4. Stable radiation changes in the right upper lobe with probable chronic pathologic fracture of the right 4th rib laterally."  03/07/2019 CT C/A wo contrast revealed "1. Stable left lower lobe pulmonary nodules, likely treated Metastases. 2. No additional evidence of metastatic disease. 3. Left  renal stone. 4. Post radiation scarring in the right hemithorax with a healed adjacent right rib fracture which is presumably pathologic in Etiology. 5.  Aortic atherosclerosis (ICD10-170.0). 6. 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 Confirmed by [place].  #4 mild thrombocytopenia likely related to Sutent- resolved with dose reduction. #5 Abnormal Liver function test - significant bump in transaminases. -resolved on labs today #6 grade 1 fatigue due to Sutent -better with dose reduction and changing schedule to 2 week on and 1 week. #7 Dysguesia from Sutent - manageable per patient. He is trying to eat as well as he can and is maintaining his body weight.  #8 RBC macrocytosis due to Sutent #9 chronic kidney disease/single kidney - creatinine stable @ 1.5-1.9  Recommended to avoid NSAIDS and other nephrotoxic medications. Optimize control of DM2 #10 rheumatoid arthritis -follows with Dr. Estanislado Pandy for mx #11 DM2 -continue f/u with Dr Philip Aspen for continue mx, blood sugar at home per patient.   PLAN: -Discussed most recent labwork from 02/28/2019; blood counts are stable, HGB normal, PLT normal, kidney function is stable, liver enzymes back to normal, blood sugar running high -Discussed 03/07/2019 CT C/A wo contrast which revealed no new spots, stable lung spots, no evidence of active disease -The pt shows no clinical or lab progression/return of his RCC at this time.  -The pt has no prohibitive toxicities  from continuing Sutent at this time. -Advised that the patient is able to continue on 81 mg ASA as per his PCP, Dr. Shon Baton recommendations.  -Continue follow up with PCP and rheumatology for RA management - appears to be having a flare off plaquenil. In light of CKD NSAIDS not appropriate, DM2 limits significant use of steroids. Would need to choose least immunosuppressive route given RCC is an immunosupression sensitive malignancy. -Recommend monitoring high blood sugar with PCP, Dr. [name] -Continue to apply ice to right sternoclavicular joint until pain subsides -Will see the pt back in 6 weeks   RTC with Dr Irene Limbo with labs in 6 weeks   The total time spent in the appt was 20 minutes and more than 50% was on counseling and direct patient cares.  Sullivan Lone MD [place] AAHIVMS [facility] Hematology/Oncology Physician [facility]  (Office):       [phone] (Work cell):  [phone] (Fax):           [phone]  I, De Burrs, am acting as a scribe for Dr. Irene Limbo  .I have reviewed the above documentation for accuracy and completeness, and I agree with the above. Brunetta Genera MD

## 2019-03-14 ENCOUNTER — Inpatient Hospital Stay: Payer: Medicare Other | Attending: Hematology | Admitting: Hematology

## 2019-03-14 ENCOUNTER — Other Ambulatory Visit: Payer: Self-pay

## 2019-03-14 ENCOUNTER — Telehealth: Payer: Self-pay | Admitting: Hematology

## 2019-03-14 VITALS — BP 131/82 | HR 60 | Temp 98.5°F | Resp 17 | Ht 70.0 in | Wt 197.0 lb

## 2019-03-14 DIAGNOSIS — E785 Hyperlipidemia, unspecified: Secondary | ICD-10-CM | POA: Diagnosis not present

## 2019-03-14 DIAGNOSIS — C7800 Secondary malignant neoplasm of unspecified lung: Secondary | ICD-10-CM | POA: Insufficient documentation

## 2019-03-14 DIAGNOSIS — Z87891 Personal history of nicotine dependence: Secondary | ICD-10-CM | POA: Diagnosis not present

## 2019-03-14 DIAGNOSIS — Z794 Long term (current) use of insulin: Secondary | ICD-10-CM | POA: Diagnosis not present

## 2019-03-14 DIAGNOSIS — N189 Chronic kidney disease, unspecified: Secondary | ICD-10-CM | POA: Insufficient documentation

## 2019-03-14 DIAGNOSIS — Z79899 Other long term (current) drug therapy: Secondary | ICD-10-CM | POA: Insufficient documentation

## 2019-03-14 DIAGNOSIS — M069 Rheumatoid arthritis, unspecified: Secondary | ICD-10-CM | POA: Insufficient documentation

## 2019-03-14 DIAGNOSIS — C649 Malignant neoplasm of unspecified kidney, except renal pelvis: Secondary | ICD-10-CM | POA: Insufficient documentation

## 2019-03-14 DIAGNOSIS — M810 Age-related osteoporosis without current pathological fracture: Secondary | ICD-10-CM | POA: Diagnosis not present

## 2019-03-14 DIAGNOSIS — Z903 Acquired absence of stomach [part of]: Secondary | ICD-10-CM | POA: Diagnosis not present

## 2019-03-14 DIAGNOSIS — D696 Thrombocytopenia, unspecified: Secondary | ICD-10-CM | POA: Diagnosis not present

## 2019-03-14 DIAGNOSIS — F329 Major depressive disorder, single episode, unspecified: Secondary | ICD-10-CM | POA: Diagnosis not present

## 2019-03-14 DIAGNOSIS — E039 Hypothyroidism, unspecified: Secondary | ICD-10-CM | POA: Diagnosis not present

## 2019-03-14 DIAGNOSIS — Z9049 Acquired absence of other specified parts of digestive tract: Secondary | ICD-10-CM | POA: Insufficient documentation

## 2019-03-14 DIAGNOSIS — Z7982 Long term (current) use of aspirin: Secondary | ICD-10-CM | POA: Insufficient documentation

## 2019-03-14 DIAGNOSIS — E119 Type 2 diabetes mellitus without complications: Secondary | ICD-10-CM | POA: Diagnosis not present

## 2019-03-14 DIAGNOSIS — C7900 Secondary malignant neoplasm of unspecified kidney and renal pelvis: Secondary | ICD-10-CM

## 2019-03-14 DIAGNOSIS — I10 Essential (primary) hypertension: Secondary | ICD-10-CM | POA: Insufficient documentation

## 2019-03-14 DIAGNOSIS — Z905 Acquired absence of kidney: Secondary | ICD-10-CM | POA: Diagnosis not present

## 2019-03-14 DIAGNOSIS — Z85850 Personal history of malignant neoplasm of thyroid: Secondary | ICD-10-CM | POA: Diagnosis not present

## 2019-03-14 NOTE — Telephone Encounter (Signed)
Scheduled per 08/10 los, patient received avs and calender.

## 2019-03-21 MED FILL — SUTENT 37.5 MG CAPSULE: 37.5 | 42 days supply | Qty: 28 | Fill #1

## 2019-04-21 ENCOUNTER — Other Ambulatory Visit: Payer: Self-pay | Admitting: Hematology

## 2019-04-21 DIAGNOSIS — C649 Malignant neoplasm of unspecified kidney, except renal pelvis: Secondary | ICD-10-CM

## 2019-04-28 ENCOUNTER — Inpatient Hospital Stay: Payer: Medicare Other | Attending: Hematology

## 2019-04-28 ENCOUNTER — Other Ambulatory Visit: Payer: Self-pay

## 2019-04-28 ENCOUNTER — Telehealth: Payer: Self-pay | Admitting: Hematology

## 2019-04-28 ENCOUNTER — Inpatient Hospital Stay: Payer: Medicare Other | Admitting: Hematology

## 2019-04-28 VITALS — BP 164/94 | HR 56 | Temp 98.2°F | Resp 18 | Ht 70.0 in | Wt 194.2 lb

## 2019-04-28 DIAGNOSIS — E785 Hyperlipidemia, unspecified: Secondary | ICD-10-CM | POA: Diagnosis not present

## 2019-04-28 DIAGNOSIS — D7589 Other specified diseases of blood and blood-forming organs: Secondary | ICD-10-CM | POA: Diagnosis not present

## 2019-04-28 DIAGNOSIS — Z79899 Other long term (current) drug therapy: Secondary | ICD-10-CM | POA: Insufficient documentation

## 2019-04-28 DIAGNOSIS — C7801 Secondary malignant neoplasm of right lung: Secondary | ICD-10-CM | POA: Diagnosis not present

## 2019-04-28 DIAGNOSIS — C7900 Secondary malignant neoplasm of unspecified kidney and renal pelvis: Secondary | ICD-10-CM | POA: Diagnosis not present

## 2019-04-28 DIAGNOSIS — Z7982 Long term (current) use of aspirin: Secondary | ICD-10-CM | POA: Insufficient documentation

## 2019-04-28 DIAGNOSIS — C7800 Secondary malignant neoplasm of unspecified lung: Secondary | ICD-10-CM | POA: Insufficient documentation

## 2019-04-28 DIAGNOSIS — E039 Hypothyroidism, unspecified: Secondary | ICD-10-CM | POA: Insufficient documentation

## 2019-04-28 DIAGNOSIS — M069 Rheumatoid arthritis, unspecified: Secondary | ICD-10-CM | POA: Insufficient documentation

## 2019-04-28 DIAGNOSIS — D696 Thrombocytopenia, unspecified: Secondary | ICD-10-CM | POA: Diagnosis not present

## 2019-04-28 DIAGNOSIS — Z794 Long term (current) use of insulin: Secondary | ICD-10-CM | POA: Diagnosis not present

## 2019-04-28 DIAGNOSIS — I10 Essential (primary) hypertension: Secondary | ICD-10-CM | POA: Insufficient documentation

## 2019-04-28 DIAGNOSIS — Z87891 Personal history of nicotine dependence: Secondary | ICD-10-CM | POA: Diagnosis not present

## 2019-04-28 DIAGNOSIS — E119 Type 2 diabetes mellitus without complications: Secondary | ICD-10-CM | POA: Insufficient documentation

## 2019-04-28 DIAGNOSIS — Z905 Acquired absence of kidney: Secondary | ICD-10-CM | POA: Diagnosis not present

## 2019-04-28 DIAGNOSIS — C641 Malignant neoplasm of right kidney, except renal pelvis: Secondary | ICD-10-CM | POA: Insufficient documentation

## 2019-04-28 DIAGNOSIS — Z85850 Personal history of malignant neoplasm of thyroid: Secondary | ICD-10-CM | POA: Insufficient documentation

## 2019-04-28 LAB — CBC WITH DIFFERENTIAL/PLATELET
Abs Immature Granulocytes: 0.01 10*3/uL (ref 0.00–0.07)
Basophils Absolute: 0 10*3/uL (ref 0.0–0.1)
Basophils Relative: 1 %
Eosinophils Absolute: 0.1 10*3/uL (ref 0.0–0.5)
Eosinophils Relative: 3 %
HCT: 40.9 % (ref 39.0–52.0)
Hemoglobin: 14.3 g/dL (ref 13.0–17.0)
Immature Granulocytes: 0 %
Lymphocytes Relative: 47 %
Lymphs Abs: 2.2 10*3/uL (ref 0.7–4.0)
MCH: 38.1 pg — ABNORMAL HIGH (ref 26.0–34.0)
MCHC: 35 g/dL (ref 30.0–36.0)
MCV: 109.1 fL — ABNORMAL HIGH (ref 80.0–100.0)
Monocytes Absolute: 0.4 10*3/uL (ref 0.1–1.0)
Monocytes Relative: 9 %
Neutro Abs: 1.8 10*3/uL (ref 1.7–7.7)
Neutrophils Relative %: 40 %
Platelets: 172 10*3/uL (ref 150–400)
RBC: 3.75 MIL/uL — ABNORMAL LOW (ref 4.22–5.81)
RDW: 12.9 % (ref 11.5–15.5)
WBC: 4.6 10*3/uL (ref 4.0–10.5)
nRBC: 0 % (ref 0.0–0.2)

## 2019-04-28 LAB — CMP (CANCER CENTER ONLY)
ALT: 43 U/L (ref 0–44)
AST: 40 U/L (ref 15–41)
Albumin: 3.5 g/dL (ref 3.5–5.0)
Alkaline Phosphatase: 113 U/L (ref 38–126)
Anion gap: 5 (ref 5–15)
BUN: 10 mg/dL (ref 8–23)
CO2: 28 mmol/L (ref 22–32)
Calcium: 8.5 mg/dL — ABNORMAL LOW (ref 8.9–10.3)
Chloride: 106 mmol/L (ref 98–111)
Creatinine: 1.74 mg/dL — ABNORMAL HIGH (ref 0.61–1.24)
GFR, Est AFR Am: 45 mL/min — ABNORMAL LOW (ref >60)
GFR, Estimated: 39 mL/min — ABNORMAL LOW (ref >60)
Glucose, Bld: 171 mg/dL — ABNORMAL HIGH (ref 70–99)
Potassium: 4.3 mmol/L (ref 3.5–5.1)
Sodium: 139 mmol/L (ref 135–145)
Total Bilirubin: 1.5 mg/dL — ABNORMAL HIGH (ref 0.3–1.2)
Total Protein: 5.7 g/dL — ABNORMAL LOW (ref 6.5–8.1)

## 2019-04-28 NOTE — Telephone Encounter (Signed)
Scheduled appt per 9/24 los. ° °Printed calendar and avs. °

## 2019-04-28 NOTE — Progress Notes (Signed)
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 2) Remote history of metastatic renal cell carcinoma Diagnosed with renal cell carcinoma 20 years ago and had a right nephrectomy. About 8 years after that he was noted to have abdominal recurrence in his pancreas spleen and small intestine and had a Whipple's procedure and significant abdominal surgery and notes that 10 out of 17 lymph nodes were positive. He also had his gallbladder removed. Postoperative course was complicated by an internal hemorrhage as per his report. Patient notes 2-3 years after that he had recurrence in his thyroid that led to a thyroidectomy. [June 2004] later he had partial gastrectomy for local recurrence. [February 2005] when he presented with GI bleeding. Patient notes that he has had no known evidence of recurrence over the last 8 years until his recent CT scan showed lung nodules.  Current Treatment: Sutent 37.5 mg po daily for 2 weeks on and 1 weeks off.  Previous treatment Sutent on cycle 1 - 50 mg by mouth daily for 4 weeks on and 2-weeks off. It was dose adjusted to 37.5 mg by mouth daily for 2 weeks with 1 week of to help mitigate issues with fatigue, mild hyperbilirubinemia, cytopenias. SBRT to dominant RML nodule.   INTERVAL HISTORY:  Dylan Jefferson is here for for his scheduled follow-up of his metastatic renal cell carcinoma. The patient's last visit with [place] was on 03/14/2019. The pt reports that he is doing well overall.  The pt reports that he is doing okay and has been staying active by gardening. He mentioned he has appt with Dr.  Philip Aspen in two weeks.   Lab results today (04/28/19) of CBC w/diff and CMP is as follows: all values are WNL except for RBC at 3.75, MCV at 109.1, MCH at 38.1, Glucose at 171, Creatinine at 1.74, Calcium at 8.5, Total Protein at 5.7, total bilirubin at 1.5, GFR, Est Non Af Am at 39, GFR and Est AFR Am at 45.  On review of systems, pt reports slight soreness due to working in his garden yesterday and denies problems taking medications, no diarrhea, mouth sores, abdominal pain and any other symptoms.   REVIEW OF SYSTEMS:   A 10+ POINT REVIEW OF SYSTEMS WAS OBTAINED including neurology, dermatology, psychiatry, cardiac, respiratory, lymph, extremities, GI, GU, Musculoskeletal, constitutional, breasts, reproductive, HEENT.  All pertinent positives are noted in the HPI.  All others are negative.     Past Medical History:  Diagnosis Date  . Arthritis   . Cancer [facility])    Renal cell  . DDD (degenerative disc disease), cervical 05/28/2016  . DDD (degenerative disc disease), lumbar 05/28/2016  . Depression   . Diabetes ([place])   . Hyperlipidemia 05/28/2016  . Hypertension   . Hypothyroidism   . 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 Kidney disease   . Nephrolithiasis   . Osteoarthritis of both hands 05/28/2016  . Osteoarthritis of both knees 05/28/2016  . Peripheral vascular disorder ([place]) 05/28/2016  . 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 Rheumatoid arthritis ([place])   . Thyroid cancer ([place]) 05/28/2016    . Past Surgical History:  Procedure Laterality Date  .  CATARACT EXTRACTION Bilateral 2013  . CHOLECYSTECTOMY    . GASTRECTOMY     tumor removed  . LIPOMA EXCISION Right 1999  . NEPHRECTOMY Right   . PANCREATECTOMY    . SPLENECTOMY    . THYROIDECTOMY    . URETERAL STENT PLACEMENT Left 2012  . WHIPPLE PROCEDURE      . Social History   Tobacco Use  . Smoking status: Former Smoker    Years: 1.50  . Smokeless tobacco: Never  Used  Substance Use Topics  . Alcohol use: Yes    Comment: occasional beer  . Drug use: No    ALLERGIES:  has No Known Allergies.  MEDICATIONS:  Current Outpatient Medications  Medication Sig Dispense Refill  . amLODipine (NORVASC) 10 MG tablet take 1 tablet by mouth once daily (Patient not taking: Reported on 03/31/2018) 90 tablet 3  . Ascorbic Acid (VITAMIN C) 1000 MG tablet Take 1,000 mg by mouth daily.    Marland Kitchen aspirin 81 MG tablet Take 81 mg by mouth daily.    Marland Kitchen atenolol (TENORMIN) 100 MG tablet     . augmented betamethasone dipropionate (DIPROLENE-AF) 0.05 % cream   0  . calcium citrate-vitamin D (CITRACAL+D) 315-200 MG-UNIT per tablet Take 2 tablets by mouth daily.     . Cyanocobalamin (VITAMIN B-12) 2500 MCG SUBL Take by mouth daily.     Marland Kitchen docusate sodium (COLACE) 100 MG capsule Take 100 mg by mouth 2 (two) times daily.    . dorzolamide-timolol (COSOPT) 22.3-6.8 MG/ML ophthalmic solution place 1 drop into affected eye twice a day  1  . famotidine (PEPCID) 20 MG tablet Take 20 mg by mouth 2 (two) times daily.    . ferrous sulfate 325 (65 FE) MG tablet Take 325 mg by mouth 2 (two) times daily with a meal.     . Glucosamine-MSM-Hyaluronic Acd (JOINT HEALTH) 750-375-30 MG TABS Take 2 tablets by mouth daily.    . insulin lispro (HUMALOG) 100 UNIT/ML injection Inject into the skin 3 (three) times daily before meals.    . Iron-Vitamins (GERITOL COMPLETE) TABS Take 1 tablet by mouth daily.    . Lactobacillus (ACIDOPHILUS) CAPS capsule Take 1 capsule by mouth daily.    Marland Kitchen latanoprost (XALATAN) 0.005 % ophthalmic solution place 1 drop into both eyes every evening  1  . LEVEMIR 100 UNIT/ML injection Inject 7 Units into the skin 2 (two) times daily.   1  . levothyroxine (SYNTHROID, LEVOTHROID) 300 MCG tablet Take 300 mcg by mouth 3 (three) times a week.     . lovastatin (MEVACOR) 40 MG tablet Take 40 mg by mouth at bedtime.     . minoxidil (LONITEN) 10 MG tablet Take 10 mg by mouth 2 (two) times  daily.     . Multiple Vitamin (MULTIVITAMIN) tablet Take 1 tablet by mouth daily.    . Omega-3 Fatty Acids (FISH OIL) 1200 MG CAPS Take 1 capsule by mouth daily.    . Pancrelipase, Lip-Prot-Amyl, (CREON) 6000 UNITS CPEP Take 4 capsules by mouth 3 (three) times daily.    . potassium citrate (UROCIT-K) 10 MEQ (1080 MG) SR tablet Take 10 mEq by mouth 2 (two) times daily.     . SUTENT 37.5 MG capsule TAKE 1 CAPSULE (37.5 MG) BY MOUTH ONCE DAILY FOR 2 WEEKS ON, 1 WEEK OFF, REPEAT EVERY 42 DAYS 28 capsule 1  . valsartan-hydrochlorothiazide (DIOVAN-HCT) 160-12.5 MG per tablet     . VOLTAREN 1 % GEL apply 3 grams to LARGE  JOINTS three times a day if needed  0   No current facility-administered medications for this visit.     PHYSICAL EXAMINATION: ECOG FS:1 - Symptomatic but completely ambulatory  There were no vitals filed for this visit. Wt Readings from Last 3 Encounters:  03/14/19 197 lb (89.4 kg)  01/24/19 196 lb 3.2 oz (89 kg)  09/09/18 190 lb 9.6 oz (86.5 kg)   There is no height or weight on file to calculate BMI.    GENERAL:alert, in no acute distress and comfortable SKIN: no acute rashes, no significant lesions EYES: conjunctiva are pink and non-injected, sclera anicteric OROPHARYNX: MMM, no exudates, no oropharyngeal erythema or ulceration NECK: Jefferson, no JVD LYMPH:  no palpable lymphadenopathy in the cervical, axillary or inguinal regions LUNGS: clear to auscultation b/l with normal respiratory effort HEART: regular rate & rhythm ABDOMEN:  normoactive bowel sounds , non tender, not distended. Extremity: +1 pedal edema  PSYCH: alert & oriented x 3 with fluent speech NEURO: no focal motor/sensory deficits   LABORATORY DATA:   I have reviewed the data as listed  . CBC Latest Ref Rng & Units 02/28/2019 01/24/2019 09/09/2018  WBC 4.0 - 10.5 K/uL 4.0 4.8 3.9(L)  Hemoglobin 13.0 - 17.0 g/dL 13.3 13.0 14.0  Hematocrit 39.0 - 52.0 % 39.2 37.3(L) 40.3  Platelets 150 - 400 K/uL 173  215 131(L)  ANC 1.4k . CBC    Component Value Date/Time   WBC 4.0 02/28/2019 1243   WBC 4.8 01/24/2019 0805   RBC 3.49 (L) 02/28/2019 1243   HGB 13.3 02/28/2019 1243   HGB 14.0 06/11/2017 0808   HCT 39.2 02/28/2019 1243   HCT 40.7 06/11/2017 0808   PLT 173 02/28/2019 1243   PLT 223 06/11/2017 0808   MCV 112.3 (H) 02/28/2019 1243   MCV 112.1 (H) 06/11/2017 0808   MCH 38.1 (H) 02/28/2019 1243   MCHC 33.9 02/28/2019 1243   RDW 12.9 02/28/2019 1243   RDW 13.9 06/11/2017 0808   LYMPHSABS 1.8 02/28/2019 1243   LYMPHSABS 1.6 06/11/2017 0808   MONOABS 0.4 02/28/2019 1243   MONOABS 0.5 06/11/2017 0808   EOSABS 0.1 02/28/2019 1243   EOSABS 0.1 06/11/2017 0808   BASOSABS 0.0 02/28/2019 1243   BASOSABS 0.0 06/11/2017 0808   . CMP Latest Ref Rng & Units 02/28/2019 01/24/2019 09/09/2018  Glucose 70 - 99 mg/dL 375(H) 248(H) 138(H)  BUN 8 - 23 mg/dL 14 24(H) 14  Creatinine 0.61 - 1.24 mg/dL 1.83(H) 1.72(H) 1.73(H)  Sodium 135 - 145 mmol/L 139 140 143  Potassium 3.5 - 5.1 mmol/L 4.8 3.8 3.9  Chloride 98 - 111 mmol/L 104 108 110  CO2 22 - 32 mmol/L 30 23 26   Calcium 8.9 - 10.3 mg/dL 8.5(L) 8.5(L) 8.5(L)  Total Protein 6.5 - 8.1 g/dL 5.4(L) 5.6(L) 5.9(L)  Total Bilirubin 0.3 - 1.2 mg/dL 1.0 1.0 1.4(H)  Alkaline Phos 38 - 126 U/L 103 112 92  AST 15 - 41 U/L 27 30 30   ALT 0 - 44 U/L 33 40 32    RADIOGRAPHIC STUDIES: I have personally reviewed the radiological images as listed and agreed with the findings in the report. No results found.   ASSESSMENT & PLAN:   68 y.o. Caucasian male with  #1 Recurrent metastatic Renal cell carcinoma with multiple lung metastases.  Has been on stable on 1st line Sutent for >59yr. Has had SBRT to the dominant right lung nodule with partial response. No lab or clinical evidence of metastatic RCC progression  at this time.  PET/CT 08/08/2016 with a couple of mildly enlarge LLL pulmonary nodules - no other overt evidence of RCC progression at this time.   PET CT scan 11/06/2016 - no overt evidence of RCC progression at this time .  PET/CT 03/13/2017 - no overt evidence of RCC progression at this time .  PET/CT 08/10/2017 - no overt evidence of RCC progression at this time .  PET/ CT done 08/10/2017, shows no evidence of  Progression at this time.  12/07/17 PET which revealed No findings suspicious for recurrent or metastatic disease.  03/22/18 PET/CT revealed One of the left lower lobe nodules has very minimally enlarged over the last 2 years, currently 0.6 by 0.4 cm and measuring 0.4 by 0.3 cm on 03/20/2016. This probably represents a previously treated metastatic nodule given that it measured 1.0 by 0.8 cm on 06/06/2015. This nodule may be subject to some slice selection phenomenon and also motion artifact given proximity to the diaphragm. Surveillance is likely warranted. 2. Chronic central lucency, marginal bony deposition, and slow healing of the right lateral fourth rib fracture. Possibilities may include underlying radiation necrosis as a cause for fracture leading to slow healing, or a low-grade pathologic lesion. Maximum SUV at this fracture site is 2.9, previously 2.4. 3. 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 There is only low-grade activity in this vicinity characteristic of radiation fibrosis, without focal activity. 4. Other imaging findings of potential clinical significance: Aortic Atherosclerosis. Coronary atherosclerosis. Mild cardiomegaly. Right nephrectomy. 8 mm nonobstructive calculus in the left kidney lower pole.   08/24/18 CT C/A/P revealed "Stable small left lower lobe pulmonary nodules from recent prior studies. As previously noted, these have decreased in size from PET-CT 06/06/2015, likely treated metastases. No new or enlarging pulmonary nodules. 2. Postsurgical changes in the upper abdomen without evidence of local recurrence or metastatic disease. 3.  Nonobstructing left renal calculus. 4. Stable radiation changes in the right upper lobe with probable chronic pathologic fracture of the right 4th rib laterally."  03/07/2019 CT C/A wo contrast revealed "1. Stable left lower lobe pulmonary nodules, likely treated Metastases. 2. No additional evidence of metastatic disease. 3. Left renal stone. 4. Post radiation scarring in the right hemithorax with a healed adjacent right rib fracture which is presumably pathologic in Etiology. 5.  Aortic atherosclerosis (ICD10-170.0). 6. 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 Confirmed by [place].  #4 mild thrombocytopenia likely related to Sutent- resolved with dose reduction. #5 Abnormal Liver function test - significant bump in transaminases. -resolved on labs today #6 grade 1 fatigue due to Sutent -better with dose reduction and changing schedule to 2 week on and 1 week. #7 Dysguesia from Sutent - manageable per patient. He is trying to eat as well as he can and is maintaining his body weight.  #8 RBC macrocytosis due to Sutent #9 chronic kidney disease/single kidney - creatinine stable @ 1.5-1.9  Recommended to avoid NSAIDS and other nephrotoxic medications. Optimize control of DM2 #10 rheumatoid arthritis -follows with Dr. Estanislado Pandy for mx #11 DM2 -continue f/u with Dr Philip Aspen for continue mx, blood sugar at home per patient.    PLAN: -Discussed pt labwork today, 04/28/19; all values are WNL except for RBC at 3.75, MCV at 109.1, MCH at 38.1, Glucose at 171, Creatinine at 1.74, Calcium at 8.5,  Total Protein at 5.7, total bilirubin at 1.5, GFR, Est Non Af Am at 39, GFR and Est AFR Am at 45. -Discussed that blood counts are normal -no clinical or lab evidence of RCC progression at this time. -Discussed repeat scans in mid December -Will see pt back in 6 weeks  FOLLOW UP: RTC with Dr  Irene Limbo with labs in 6 weeks  The total time spent in the appt was 20 minutes and more than 50% was on counseling and direct patient cares.  All of the patient's questions were answered with apparent satisfaction. The patient knows to call the clinic with any problems, questions or concerns.    Sullivan Lone MD MS AAHIVMS [facility] Hematology/Oncology Physician [facility]  (Office):       [phone] (Work cell):  [phone] (Fax):           [phone]  I, Jacqualyn Posey, am acting as a scribe for Dr. Sullivan Lone.   .I have reviewed the above documentation for accuracy and completeness, and I agree with the above. Brunetta Genera MD

## 2019-05-02 MED FILL — SUTENT 37.5 MG CAPSULE: 37.5 | 42 days supply | Qty: 28 | Fill #0

## 2019-05-19 ENCOUNTER — Telehealth: Payer: Self-pay

## 2019-05-19 NOTE — Telephone Encounter (Signed)
Oral Oncology Patient Advocate Encounter  Was successful in securing patient a $10,000 grant from [facility] to provide copayment coverage for Sutent. This will keep the out of pocket expense at $0.   I have spoken with the patient..   The billing information is as follows and has been shared with [place].   Member ID: [id number] Group ID: [id number] RxBin: [id number] Dates of Eligibility: 04/17/19 through 04/15/20  [place] [facility] [place] Phone [phone] Fax [phone] 05/19/2019    3:00 PM

## 2019-06-08 NOTE — Progress Notes (Signed)
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 2) Remote history of metastatic renal cell carcinoma Diagnosed with renal cell carcinoma 20 years ago and had a right nephrectomy. About 8 years after that he was noted to have abdominal recurrence in his pancreas spleen and small intestine and had a Whipple's procedure and significant abdominal surgery and notes that 10 out of 17 lymph nodes were positive. He also had his gallbladder removed. Postoperative course was complicated by an internal hemorrhage as per his report. Patient notes 2-3 years after that he had recurrence in his thyroid that led to a thyroidectomy. [June 2004] later he had partial gastrectomy for local recurrence. [February 2005] when he presented with GI bleeding. Patient notes that he has had no known evidence of recurrence over the last 8 years until his recent CT scan showed lung nodules.  Current Treatment: Sutent 37.5 mg po daily for 2 weeks on and 1 weeks off.  Previous treatment Sutent on cycle 1 - 50 mg by mouth daily for 4 weeks on and 2-weeks off. It was dose adjusted to 37.5 mg by mouth daily for 2 weeks with 1 week of to help mitigate issues with fatigue, mild hyperbilirubinemia, cytopenias. SBRT to dominant RML nodule.   INTERVAL HISTORY:  Dylan Jefferson is here for for his scheduled follow-up of his metastatic renal cell carcinoma. The patient's last visit with [place] was on 04/28/2019. The pt reports that he is doing well overall.  The pt reports he is doing okay and that his medication list is up to date.  He states that he is social distancing  and staying safe from COVID-19.  He broke his 4th toe on his left foot and has been rest it to good effect.  Lab results today (06/09/19) of CBC w/diff and CMP is as follows: all values are WNL except for WBC at 3.6, RBC at 3.51, HCT at 38.4, MCV at 109.4, MCH at 38.2, Glucose Bld at 265, Creatinine at 1.88, Calcium at 8.3, Total Protein at 5.4, Albumin at 3.3, GFR Est Non Af Am at 36, GFR Est AFR AM at 41.  On review of systems, pt reports having a broken toe on the left foot and denies abdominal pain, fevers, chills and any other symptoms.     Past Medical History:  Diagnosis Date  . Arthritis   . Cancer [facility])    Renal cell  . DDD (degenerative disc disease), cervical 05/28/2016  . DDD (degenerative disc disease), lumbar 05/28/2016  . Depression   . Diabetes ([place])   . Hyperlipidemia 05/28/2016  . Hypertension   . Hypothyroidism   . 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 Kidney disease   . Nephrolithiasis   . Osteoarthritis of both hands 05/28/2016  . Osteoarthritis of both knees 05/28/2016  . Peripheral vascular disorder ([place]) 05/28/2016  . 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 Rheumatoid arthritis ([place])   . Thyroid cancer ([place]) 05/28/2016    . Past Surgical History:  Procedure Laterality Date  . CATARACT EXTRACTION Bilateral 2013  . CHOLECYSTECTOMY    . GASTRECTOMY     tumor removed  . LIPOMA EXCISION Right  [place] Right   . PANCREATECTOMY    . SPLENECTOMY    . THYROIDECTOMY    . URETERAL STENT PLACEMENT Left 2012  . WHIPPLE PROCEDURE      . Social History   Tobacco Use  . Smoking status: Former Smoker    Years: 1.50  . Smokeless tobacco: Never Used  Substance Use Topics  . Alcohol use: Yes    Comment: occasional beer  . Drug use: No    ALLERGIES:  has No Known Allergies.  MEDICATIONS:  Current Outpatient Medications  Medication Sig Dispense Refill  . amLODipine (NORVASC) 10 MG tablet  take 1 tablet by mouth once daily (Patient not taking: Reported on 03/31/2018) 90 tablet 3  . Ascorbic Acid (VITAMIN C) 1000 MG tablet Take 1,000 mg by mouth daily.    Marland Kitchen aspirin 81 MG tablet Take 81 mg by mouth daily.    Marland Kitchen atenolol (TENORMIN) 100 MG tablet     . augmented betamethasone dipropionate (DIPROLENE-AF) 0.05 % cream   0  . calcium citrate-vitamin D (CITRACAL+D) 315-200 MG-UNIT per tablet Take 2 tablets by mouth daily.     . Cyanocobalamin (VITAMIN B-12) 2500 MCG SUBL Take by mouth daily.     Marland Kitchen docusate sodium (COLACE) 100 MG capsule Take 100 mg by mouth 2 (two) times daily.    . dorzolamide-timolol (COSOPT) 22.3-6.8 MG/ML ophthalmic solution place 1 drop into affected eye twice a day  1  . famotidine (PEPCID) 20 MG tablet Take 20 mg by mouth 2 (two) times daily.    . ferrous sulfate 325 (65 FE) MG tablet Take 325 mg by mouth 2 (two) times daily with a meal.     . Glucosamine-MSM-Hyaluronic Acd (JOINT HEALTH) 750-375-30 MG TABS Take 2 tablets by mouth daily.    . insulin lispro (HUMALOG) 100 UNIT/ML injection Inject into the skin 3 (three) times daily before meals.    . Iron-Vitamins (GERITOL COMPLETE) TABS Take 1 tablet by mouth daily.    . Lactobacillus (ACIDOPHILUS) CAPS capsule Take 1 capsule by mouth daily.    Marland Kitchen latanoprost (XALATAN) 0.005 % ophthalmic solution place 1 drop into both eyes every evening  1  . LEVEMIR 100 UNIT/ML injection Inject 7 Units into the skin 2 (two) times daily.   1  . levothyroxine (SYNTHROID, LEVOTHROID) 300 MCG tablet Take 300 mcg by mouth 3 (three) times a week.     . lovastatin (MEVACOR) 40 MG tablet Take 40 mg by mouth at bedtime.     . minoxidil (LONITEN) 10 MG tablet Take 10 mg by mouth 2 (two) times daily.     . Multiple Vitamin (MULTIVITAMIN) tablet Take 1 tablet by mouth daily.    . Omega-3 Fatty Acids (FISH OIL) 1200 MG CAPS Take 1 capsule by mouth daily.    . Pancrelipase, Lip-Prot-Amyl, (CREON) 6000 UNITS CPEP Take 4 capsules by mouth 3  (three) times daily.    . potassium citrate (UROCIT-K) 10 MEQ (1080 MG) SR tablet Take 10 mEq by mouth 2 (two) times daily.     . SUTENT 37.5 MG capsule TAKE 1 CAPSULE (37.5 MG) BY MOUTH ONCE DAILY FOR 2 WEEKS ON, 1 WEEK OFF, REPEAT EVERY 42 DAYS 28 capsule 1  . valsartan-hydrochlorothiazide (DIOVAN-HCT) 160-12.5 MG per tablet     . VOLTAREN 1 % GEL apply 3 grams to LARGE JOINTS three times a day if needed  0   No current facility-administered medications for this visit.     REVIEW  OF SYSTEMS:   A 10+ POINT REVIEW OF SYSTEMS WAS OBTAINED including neurology, dermatology, psychiatry, cardiac, respiratory, lymph, extremities, GI, GU, Musculoskeletal, constitutional, breasts, reproductive, HEENT.  All pertinent positives are noted in the HPI.  All others are negative.    PHYSICAL EXAMINATION: ECOG FS:1 - Symptomatic but completely ambulatory  Vitals:   06/09/19 0858  BP: (!) 172/76  Pulse: (!) 54  Resp: 18  Temp: 98.5 F (36.9 C)  SpO2: 98%   Wt Readings from Last 3 Encounters:  06/09/19 195 lb 11.2 oz (88.8 kg)  04/28/19 194 lb 3.2 oz (88.1 kg)  03/14/19 197 lb (89.4 kg)   Body mass index is 28.08 kg/m.    GENERAL:alert, in no acute distress and comfortable SKIN: no acute rashes, no significant lesions EYES: conjunctiva are pink and non-injected, sclera anicteric OROPHARYNX: MMM, no exudates, no oropharyngeal erythema or ulceration NECK: supple, no JVD LYMPH:  no palpable lymphadenopathy in the cervical, axillary or inguinal regions LUNGS: clear to auscultation b/l with normal respiratory effort HEART: regular rate & rhythm ABDOMEN:  normoactive bowel sounds , non tender, not distended. Extremity:  +1 pedal edema  PSYCH: alert & oriented x 3 with fluent speech NEURO: no focal motor/sensory deficits   LABORATORY DATA:   I have reviewed the data as listed  . CBC Latest Ref Rng & Units 06/09/2019 04/28/2019 02/28/2019  WBC 4.0 - 10.5 K/uL 3.6(L) 4.6 4.0  Hemoglobin 13.0  - 17.0 g/dL 13.4 14.3 13.3  Hematocrit 39.0 - 52.0 % 38.4(L) 40.9 39.2  Platelets 150 - 400 K/uL 171 172 173  ANC 1.4k . CBC    Component Value Date/Time   WBC 3.6 (L) 06/09/2019 0837   RBC 3.51 (L) 06/09/2019 0837   HGB 13.4 06/09/2019 0837   HGB 13.3 02/28/2019 1243   HGB 14.0 06/11/2017 0808   HCT 38.4 (L) 06/09/2019 0837   HCT 40.7 06/11/2017 0808   PLT 171 06/09/2019 0837   PLT 173 02/28/2019 1243   PLT 223 06/11/2017 0808   MCV 109.4 (H) 06/09/2019 0837   MCV 112.1 (H) 06/11/2017 0808   MCH 38.2 (H) 06/09/2019 0837   MCHC 34.9 06/09/2019 0837   RDW 13.8 06/09/2019 0837   RDW 13.9 06/11/2017 0808   LYMPHSABS 1.3 06/09/2019 0837   LYMPHSABS 1.6 06/11/2017 0808   MONOABS 0.4 06/09/2019 0837   MONOABS 0.5 06/11/2017 0808   EOSABS 0.2 06/09/2019 0837   EOSABS 0.1 06/11/2017 0808   BASOSABS 0.0 06/09/2019 0837   BASOSABS 0.0 06/11/2017 0808   . CMP Latest Ref Rng & Units 06/09/2019 04/28/2019 02/28/2019  Glucose 70 - 99 mg/dL 265(H) 171(H) 375(H)  BUN 8 - 23 mg/dL 21 10 14   Creatinine 0.61 - 1.24 mg/dL 1.88(H) 1.74(H) 1.83(H)  Sodium 135 - 145 mmol/L 139 139 139  Potassium 3.5 - 5.1 mmol/L 4.7 4.3 4.8  Chloride 98 - 111 mmol/L 107 106 104  CO2 22 - 32 mmol/L 23 28 30   Calcium 8.9 - 10.3 mg/dL 8.3(L) 8.5(L) 8.5(L)  Total Protein 6.5 - 8.1 g/dL 5.4(L) 5.7(L) 5.4(L)  Total Bilirubin 0.3 - 1.2 mg/dL 1.2 1.5(H) 1.0  Alkaline Phos 38 - 126 U/L 107 113 103  AST 15 - 41 U/L 36 40 27  ALT 0 - 44 U/L 43 43 33    RADIOGRAPHIC STUDIES: I have personally reviewed the radiological images as listed and agreed with the findings in the report. No results found.   ASSESSMENT & PLAN:   68 y.o. Caucasian  male with  #1 Recurrent metastatic Renal cell carcinoma with multiple lung metastases.  Has been on stable on 1st line Sutent for >59yr. Has had SBRT to the dominant right lung nodule with partial response. No lab or clinical evidence of metastatic RCC progression at this time.   PET/CT 08/08/2016 with a couple of mildly enlarge LLL pulmonary nodules - no other overt evidence of RCC progression at this time.  PET CT scan 11/06/2016 - no overt evidence of RCC progression at this time .  PET/CT 03/13/2017 - no overt evidence of RCC progression at this time .  PET/CT 08/10/2017 - no overt evidence of RCC progression at this time .  PET/ CT done 08/10/2017, shows no evidence of  Progression at this time.  12/07/17 PET which revealed No findings suspicious for recurrent or metastatic disease.  03/22/18 PET/CT revealed One of the left lower lobe nodules has very minimally enlarged over the last 2 years, currently 0.6 by 0.4 cm and measuring 0.4 by 0.3 cm on 03/20/2016. This probably represents a previously treated metastatic nodule given that it measured 1.0 by 0.8 cm on 06/06/2015. This nodule may be subject to some slice selection phenomenon and also motion artifact given proximity to the diaphragm. Surveillance is likely warranted. 2. Chronic central lucency, marginal bony deposition, and slow healing of the right lateral fourth rib fracture. Possibilities may include underlying radiation necrosis as a cause for fracture leading to slow healing, or a low-grade pathologic lesion. Maximum SUV at this fracture site is 2.9, previously 2.4. 3. 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 There is only low-grade activity in this vicinity characteristic of radiation fibrosis, without focal activity. 4. Other imaging findings of potential clinical significance: Aortic Atherosclerosis. Coronary atherosclerosis. Mild cardiomegaly. Right nephrectomy. 8 mm nonobstructive calculus in the left kidney lower pole.   08/24/18 CT C/A/P revealed "Stable small left lower lobe pulmonary nodules from recent prior studies. As previously noted, these have decreased in size from PET-CT 06/06/2015, likely treated metastases. No new or  enlarging pulmonary nodules. 2. Postsurgical changes in the upper abdomen without evidence of local recurrence or metastatic disease. 3. Nonobstructing left renal calculus. 4. Stable radiation changes in the right upper lobe with probable chronic pathologic fracture of the right 4th rib laterally."  03/07/2019 CT C/A wo contrast revealed "1. Stable left lower lobe pulmonary nodules, likely treated Metastases. 2. No additional evidence of metastatic disease. 3. Left renal stone. 4. Post radiation scarring in the right hemithorax with a healed adjacent right rib fracture which is presumably pathologic in Etiology. 5.  Aortic atherosclerosis (ICD10-170.0). 6. 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 Confirmed by [place].  #4 mild thrombocytopenia likely related to Sutent- resolved with dose reduction. #5 Abnormal Liver function test - significant bump in transaminases. -resolved on labs today #6 grade 1 fatigue due to Sutent -better with dose reduction and changing schedule to 2 week on and 1 week. #7 Dysguesia from Sutent - manageable per patient. He is trying to eat as well as he can and is maintaining his body weight.  #8 RBC macrocytosis due to Sutent #9 chronic kidney disease/single kidney - creatinine stable @ 1.5-1.9  Recommended to avoid NSAIDS and other nephrotoxic medications. Optimize control of DM2 #10 rheumatoid arthritis -follows with Dr. Estanislado Pandy for mx #11 DM2 -continue f/u  with Dr Philip Aspen for continue mx, blood sugar at home per patient.    PLAN: -Discussed pt labwork today, 06/09/19; CBC w/diff and CMP is as follows: all values are WNL except for WBC at 3.6, RBC at 3.51, HCT at 38.4, MCV at 109.4, MCH at 38.2, Glucose Bld at 265, Creatinine at 1.88, Calcium at 8.3, Total Protein at 5.4, Albumin at 3.3, GFR Est Non Af Am at 36, GFR Est AFR AM at 41 -Discussed that  blood count is improving -Discussed blood sugars are stable. -Discussed that liver enzymes are stable -Discussed the difference in a PET scan and CT scan. [employer] does not want to cover PET scan and he is trying to appeal the decision. Advised that if they do not approve PET scan that a CT scan will show information needed for treatment. Pt would like to have CT scan next year  (Middle of January) and will not be filing and appeal for PET scan. - Discussed his broken toe on the left foot and applied a splint. Advised to continue to ice it and monitor for color change  -continue current dose of Sutent.37.5mg   Po 2 weeks on 1 week off. -Repeat scans in January 2021  FOLLOW UP: CT chest and abdomin in 10 wks  RTC with Dr Irene Limbo with labs in 11 wks  The total time spent in the appt was 25 minutes and more than 50% was on counseling and direct patient cares.  All of the patient's questions were answered with apparent satisfaction. The patient knows to call the clinic with any problems, questions or concerns.    Sullivan Lone MD MS AAHIVMS [facility] Hematology/Oncology Physician [facility]  (Office):       [phone] (Work cell):  [phone] (Fax):           [phone]  I, Scot Dock, am acting as a scribe for Dr. Sullivan Lone.   .I have reviewed the above documentation for accuracy and completeness, and I agree with the above. Brunetta Genera MD

## 2019-06-09 ENCOUNTER — Other Ambulatory Visit: Payer: Self-pay

## 2019-06-09 ENCOUNTER — Telehealth: Payer: Self-pay | Admitting: Hematology

## 2019-06-09 ENCOUNTER — Inpatient Hospital Stay: Payer: Medicare Other | Attending: Hematology

## 2019-06-09 ENCOUNTER — Inpatient Hospital Stay: Payer: Medicare Other | Admitting: Hematology

## 2019-06-09 VITALS — BP 172/76 | HR 54 | Temp 98.5°F | Resp 18 | Ht 70.0 in | Wt 195.7 lb

## 2019-06-09 DIAGNOSIS — C7801 Secondary malignant neoplasm of right lung: Secondary | ICD-10-CM

## 2019-06-09 DIAGNOSIS — C641 Malignant neoplasm of right kidney, except renal pelvis: Secondary | ICD-10-CM | POA: Diagnosis present

## 2019-06-09 DIAGNOSIS — C7900 Secondary malignant neoplasm of unspecified kidney and renal pelvis: Secondary | ICD-10-CM

## 2019-06-09 DIAGNOSIS — C7800 Secondary malignant neoplasm of unspecified lung: Secondary | ICD-10-CM | POA: Insufficient documentation

## 2019-06-09 LAB — CBC WITH DIFFERENTIAL/PLATELET
Abs Immature Granulocytes: 0.01 10*3/uL (ref 0.00–0.07)
Basophils Absolute: 0 10*3/uL (ref 0.0–0.1)
Basophils Relative: 1 %
Eosinophils Absolute: 0.2 10*3/uL (ref 0.0–0.5)
Eosinophils Relative: 5 %
HCT: 38.4 % — ABNORMAL LOW (ref 39.0–52.0)
Hemoglobin: 13.4 g/dL (ref 13.0–17.0)
Immature Granulocytes: 0 %
Lymphocytes Relative: 36 %
Lymphs Abs: 1.3 10*3/uL (ref 0.7–4.0)
MCH: 38.2 pg — ABNORMAL HIGH (ref 26.0–34.0)
MCHC: 34.9 g/dL (ref 30.0–36.0)
MCV: 109.4 fL — ABNORMAL HIGH (ref 80.0–100.0)
Monocytes Absolute: 0.4 10*3/uL (ref 0.1–1.0)
Monocytes Relative: 11 %
Neutro Abs: 1.7 10*3/uL (ref 1.7–7.7)
Neutrophils Relative %: 47 %
Platelets: 171 10*3/uL (ref 150–400)
RBC: 3.51 MIL/uL — ABNORMAL LOW (ref 4.22–5.81)
RDW: 13.8 % (ref 11.5–15.5)
WBC: 3.6 10*3/uL — ABNORMAL LOW (ref 4.0–10.5)
nRBC: 0 % (ref 0.0–0.2)

## 2019-06-09 LAB — CMP (CANCER CENTER ONLY)
ALT: 43 U/L (ref 0–44)
AST: 36 U/L (ref 15–41)
Albumin: 3.3 g/dL — ABNORMAL LOW (ref 3.5–5.0)
Alkaline Phosphatase: 107 U/L (ref 38–126)
Anion gap: 9 (ref 5–15)
BUN: 21 mg/dL (ref 8–23)
CO2: 23 mmol/L (ref 22–32)
Calcium: 8.3 mg/dL — ABNORMAL LOW (ref 8.9–10.3)
Chloride: 107 mmol/L (ref 98–111)
Creatinine: 1.88 mg/dL — ABNORMAL HIGH (ref 0.61–1.24)
GFR, Est AFR Am: 41 mL/min — ABNORMAL LOW (ref >60)
GFR, Estimated: 36 mL/min — ABNORMAL LOW (ref >60)
Glucose, Bld: 265 mg/dL — ABNORMAL HIGH (ref 70–99)
Potassium: 4.7 mmol/L (ref 3.5–5.1)
Sodium: 139 mmol/L (ref 135–145)
Total Bilirubin: 1.2 mg/dL (ref 0.3–1.2)
Total Protein: 5.4 g/dL — ABNORMAL LOW (ref 6.5–8.1)

## 2019-06-09 NOTE — Telephone Encounter (Signed)
Scheduled per 11/05 los, patient received after visit summary and calender.

## 2019-06-13 MED FILL — SUTENT 37.5 MG CAPSULE: 37.5 | 42 days supply | Qty: 28 | Fill #1

## 2019-07-07 ENCOUNTER — Other Ambulatory Visit: Payer: Self-pay | Admitting: Hematology

## 2019-07-07 DIAGNOSIS — C649 Malignant neoplasm of unspecified kidney, except renal pelvis: Secondary | ICD-10-CM

## 2019-07-15 ENCOUNTER — Telehealth: Payer: Self-pay | Admitting: Hematology

## 2019-07-15 NOTE — Telephone Encounter (Signed)
Returned patient's phone call regarding scheduling an appointment, left a voicemail stating that time requested is not available.

## 2019-07-15 NOTE — Telephone Encounter (Signed)
Patient returned phone call regarding voicemail that was left, lab appointment scheduled for Ct scan on 01/07.

## 2019-07-22 MED FILL — SUTENT 37.5 MG CAPSULE: 37.5 | 42 days supply | Qty: 28 | Fill #0

## 2019-08-11 ENCOUNTER — Inpatient Hospital Stay: Payer: Medicare Other | Attending: Hematology

## 2019-08-11 ENCOUNTER — Other Ambulatory Visit: Payer: Self-pay

## 2019-08-11 DIAGNOSIS — Z79899 Other long term (current) drug therapy: Secondary | ICD-10-CM | POA: Diagnosis not present

## 2019-08-11 DIAGNOSIS — C7801 Secondary malignant neoplasm of right lung: Secondary | ICD-10-CM

## 2019-08-11 DIAGNOSIS — C7800 Secondary malignant neoplasm of unspecified lung: Secondary | ICD-10-CM | POA: Insufficient documentation

## 2019-08-11 DIAGNOSIS — Z85850 Personal history of malignant neoplasm of thyroid: Secondary | ICD-10-CM | POA: Diagnosis not present

## 2019-08-11 DIAGNOSIS — I10 Essential (primary) hypertension: Secondary | ICD-10-CM | POA: Diagnosis not present

## 2019-08-11 DIAGNOSIS — M069 Rheumatoid arthritis, unspecified: Secondary | ICD-10-CM | POA: Insufficient documentation

## 2019-08-11 DIAGNOSIS — E039 Hypothyroidism, unspecified: Secondary | ICD-10-CM | POA: Insufficient documentation

## 2019-08-11 DIAGNOSIS — C7900 Secondary malignant neoplasm of unspecified kidney and renal pelvis: Secondary | ICD-10-CM

## 2019-08-11 DIAGNOSIS — C641 Malignant neoplasm of right kidney, except renal pelvis: Secondary | ICD-10-CM | POA: Diagnosis present

## 2019-08-11 DIAGNOSIS — N189 Chronic kidney disease, unspecified: Secondary | ICD-10-CM | POA: Insufficient documentation

## 2019-08-11 DIAGNOSIS — E119 Type 2 diabetes mellitus without complications: Secondary | ICD-10-CM | POA: Insufficient documentation

## 2019-08-11 DIAGNOSIS — Z7982 Long term (current) use of aspirin: Secondary | ICD-10-CM | POA: Insufficient documentation

## 2019-08-11 DIAGNOSIS — Z87891 Personal history of nicotine dependence: Secondary | ICD-10-CM | POA: Insufficient documentation

## 2019-08-11 LAB — CMP (CANCER CENTER ONLY)
ALT: 27 U/L (ref 0–44)
AST: 32 U/L (ref 15–41)
Albumin: 2.8 g/dL — ABNORMAL LOW (ref 3.5–5.0)
Alkaline Phosphatase: 103 U/L (ref 38–126)
Anion gap: 7 (ref 5–15)
BUN: 16 mg/dL (ref 8–23)
CO2: 25 mmol/L (ref 22–32)
Calcium: 7.4 mg/dL — ABNORMAL LOW (ref 8.9–10.3)
Chloride: 111 mmol/L (ref 98–111)
Creatinine: 2.13 mg/dL — ABNORMAL HIGH (ref 0.61–1.24)
GFR, Est AFR Am: 36 mL/min — ABNORMAL LOW (ref >60)
GFR, Estimated: 31 mL/min — ABNORMAL LOW (ref >60)
Glucose, Bld: 139 mg/dL — ABNORMAL HIGH (ref 70–99)
Potassium: 4.7 mmol/L (ref 3.5–5.1)
Sodium: 143 mmol/L (ref 135–145)
Total Bilirubin: 0.8 mg/dL (ref 0.3–1.2)
Total Protein: 4.8 g/dL — ABNORMAL LOW (ref 6.5–8.1)

## 2019-08-11 LAB — CBC WITH DIFFERENTIAL/PLATELET
Abs Immature Granulocytes: 0.01 10*3/uL (ref 0.00–0.07)
Basophils Absolute: 0 10*3/uL (ref 0.0–0.1)
Basophils Relative: 1 %
Eosinophils Absolute: 0.4 10*3/uL (ref 0.0–0.5)
Eosinophils Relative: 10 %
HCT: 37.5 % — ABNORMAL LOW (ref 39.0–52.0)
Hemoglobin: 12.7 g/dL — ABNORMAL LOW (ref 13.0–17.0)
Immature Granulocytes: 0 %
Lymphocytes Relative: 38 %
Lymphs Abs: 1.5 10*3/uL (ref 0.7–4.0)
MCH: 37.6 pg — ABNORMAL HIGH (ref 26.0–34.0)
MCHC: 33.9 g/dL (ref 30.0–36.0)
MCV: 110.9 fL — ABNORMAL HIGH (ref 80.0–100.0)
Monocytes Absolute: 0.5 10*3/uL (ref 0.1–1.0)
Monocytes Relative: 11 %
Neutro Abs: 1.6 10*3/uL — ABNORMAL LOW (ref 1.7–7.7)
Neutrophils Relative %: 40 %
Platelets: 158 10*3/uL (ref 150–400)
RBC: 3.38 MIL/uL — ABNORMAL LOW (ref 4.22–5.81)
RDW: 13.4 % (ref 11.5–15.5)
WBC: 4.1 10*3/uL (ref 4.0–10.5)
nRBC: 0 % (ref 0.0–0.2)

## 2019-08-18 ENCOUNTER — Other Ambulatory Visit: Payer: Self-pay

## 2019-08-18 ENCOUNTER — Ambulatory Visit (HOSPITAL_COMMUNITY)
Admission: RE | Admit: 2019-08-18 | Discharge: 2019-08-18 | Disposition: A | Discharge status: Home / Self Care | Payer: Medicare Other | Source: Ambulatory Visit | Attending: Hematology | Admitting: Hematology

## 2019-08-18 DIAGNOSIS — C7900 Secondary malignant neoplasm of unspecified kidney and renal pelvis: Secondary | ICD-10-CM | POA: Diagnosis present

## 2019-08-18 DIAGNOSIS — C7801 Secondary malignant neoplasm of right lung: Secondary | ICD-10-CM

## 2019-08-24 NOTE — Progress Notes (Signed)
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 2) Remote history of metastatic renal cell carcinoma Diagnosed with renal cell carcinoma 20 years ago and had a right nephrectomy. About 8 years after that he was noted to have abdominal recurrence in his pancreas spleen and small intestine and had a Whipple's procedure and significant abdominal surgery and notes that 10 out of 17 lymph nodes were positive. He also had his gallbladder removed. Postoperative course was complicated by an internal hemorrhage as per his report. Patient notes 2-3 years after that he had recurrence in his thyroid that led to a thyroidectomy. [June 2004] later he had partial gastrectomy for local recurrence. [February 2005] when he presented with GI bleeding. Patient notes that he has had no known evidence of recurrence over the last 8 years until his recent CT scan showed lung nodules.  Current Treatment: Sutent 37.5 mg po daily for 2 weeks on and 1 weeks off.  Previous treatment Sutent on cycle 1 - 50 mg by mouth daily for 4 weeks on and 2-weeks off. It was dose adjusted to 37.5 mg by mouth daily for 2 weeks with 1 week of to help mitigate issues with fatigue, mild hyperbilirubinemia, cytopenias. SBRT to dominant RML nodule.   INTERVAL HISTORY:  Dylan Jefferson is here for for his scheduled follow-up of his metastatic renal cell carcinoma.The patient's last visit with [place] was on 06/09/2019. The pt reports that he is doing well overall.  The pt reports he still has pain from cracked ribs  He was taking tylenol extra strength and tramadol consistently for  pain but has decreased the amount he is taking.   His A1c was 7.1 around the past 5 months His wife is helping him make sure he takes his insulin everyday before he eats.   He is having difficulty performing sexually. And is interested in any options for him. When he is taking tramadol he is unable to ejaculate.    Of note since the patient's last visit, pt has had CT Chest Wo Contrast (Accession [id number]) and  CT Abdomen Pelvis Wo Contrast (Accession [id number] on 08/18/2019 with results revealing "1. No findings of progressive malignancy. Two small left lower lobe pulmonary nodules which are likely treated metastatic lesions.Stable appearance of the right fourth rib fracture with underlying lucency suspicious for a pathologic fracture.2. Stable perifissural opacities in the right lung likely from prior radiation therapy. 3. Nondistention of the colon with diffuse wall thickening, most striking in the ascending colon and rectum, query colitis. There is some scattered air-levels in proximal loops of small bowel includingone mildly dilated loop of right upper quadrant jejunum, but no findings of obstruction.4. Other imaging findings of potential clinical significance:Coronary atherosclerosis. Stable pneumobilia. Prior Whipple procedure. Nonobstructive left nephrolithiasis. Mild prostatomegaly.Mild but chronic appearing perirectal stranding."    Lab results today (08/11/2019) of CBC w/diff and CMP is as follows: all values are WNL except for RBC at 3.38, Hemoglobin at 12.7, HCT at 37.5, MCV at 110.9, MCH at 37.6, Neutro Abs at 1.6, Glucose Bld at 139, Creatinine at 2.13, Calcium at 7.4, Total protein at 4.8, Albumin at 2.8, GFR Est Non AF Am at 31, GFR Est  AFR Am at 36 .  On review of systems, pt reports no new concerns and denies abdominal pain, diarrhea, tenderness over the chest, testicular pain or swellig  and any other symptoms.   Past Medical History:  Diagnosis Date  . Arthritis   .  Cancer [facility])    Renal cell  . DDD (degenerative disc disease), cervical 05/28/2016  . DDD (degenerative disc disease), lumbar 05/28/2016  . Depression   . Diabetes ([place])   . Hyperlipidemia 05/28/2016  . Hypertension   . Hypothyroidism   . 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 Kidney disease   . Nephrolithiasis   . Osteoarthritis of both hands 05/28/2016  . Osteoarthritis of both knees 05/28/2016  . Peripheral vascular disorder ([place]) 05/28/2016  . 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 Rheumatoid arthritis ([place])   . Thyroid cancer ([place]) 05/28/2016    . Past Surgical History:  Procedure Laterality Date  . CATARACT EXTRACTION Bilateral 2013  . CHOLECYSTECTOMY    . GASTRECTOMY     tumor removed  . LIPOMA EXCISION Right 1999  . NEPHRECTOMY Right   . PANCREATECTOMY    . SPLENECTOMY    . THYROIDECTOMY    . URETERAL STENT PLACEMENT Left 2012  . WHIPPLE PROCEDURE      . Social History   Tobacco Use  . Smoking status: Former Smoker    Years: 1.50  . Smokeless tobacco: Never Used  Substance Use Topics  . Alcohol use: Yes    Comment: occasional beer  . Drug use: No    ALLERGIES:  has No Known Allergies.  MEDICATIONS:  Current Outpatient Medications  Medication Sig Dispense Refill  . amLODipine (NORVASC) 10 MG tablet take 1 tablet by mouth once daily (Patient not taking: Reported on 03/31/2018) 90 tablet 3  . Ascorbic Acid (VITAMIN C) 1000 MG tablet Take 1,000 mg by mouth daily.    Marland Kitchen aspirin 81 MG tablet Take 81 mg by mouth daily.    Marland Kitchen atenolol (TENORMIN) 100 MG tablet     . augmented betamethasone dipropionate (DIPROLENE-AF) 0.05 % cream   0  . calcium citrate-vitamin D (CITRACAL+D) 315-200 MG-UNIT per tablet Take 2 tablets by mouth daily.     . Cyanocobalamin (VITAMIN B-12) 2500 MCG SUBL Take by mouth daily.     Marland Kitchen docusate sodium (COLACE) 100 MG capsule Take 100 mg by mouth 2 (two) times daily.    .  dorzolamide-timolol (COSOPT) 22.3-6.8 MG/ML ophthalmic solution place 1 drop into affected eye twice a day  1  . famotidine (PEPCID) 20 MG tablet Take 20 mg by mouth 2 (two) times daily.    . ferrous sulfate 325 (65 FE) MG tablet Take 325 mg by mouth 2 (two) times daily with a meal.     . Glucosamine-MSM-Hyaluronic Acd (JOINT HEALTH) 750-375-30 MG TABS Take 2 tablets by mouth daily.    . insulin lispro (HUMALOG) 100 UNIT/ML injection Inject into the skin 3 (three) times daily before meals.    . Iron-Vitamins (GERITOL COMPLETE) TABS Take 1 tablet by mouth daily.    . Lactobacillus (ACIDOPHILUS) CAPS capsule Take 1 capsule by mouth daily.    Marland Kitchen latanoprost (XALATAN) 0.005 % ophthalmic solution place 1 drop into both eyes every evening  1  . LEVEMIR 100 UNIT/ML injection Inject 7 Units into the skin 2 (two) times daily.   1  . levothyroxine (SYNTHROID, LEVOTHROID) 300 MCG tablet Take 300 mcg by mouth 3 (three) times a  week.     . lovastatin (MEVACOR) 40 MG tablet Take 40 mg by mouth at bedtime.     . minoxidil (LONITEN) 10 MG tablet Take 10 mg by mouth 2 (two) times daily.     . Multiple Vitamin (MULTIVITAMIN) tablet Take 1 tablet by mouth daily.    . Omega-3 Fatty Acids (FISH OIL) 1200 MG CAPS Take 1 capsule by mouth daily.    . Pancrelipase, Lip-Prot-Amyl, (CREON) 6000 UNITS CPEP Take 4 capsules by mouth 3 (three) times daily.    . potassium citrate (UROCIT-K) 10 MEQ (1080 MG) SR tablet Take 10 mEq by mouth 2 (two) times daily.     . SUTENT 37.5 MG capsule TAKE 1 CAPSULE (37.5 MG) BY MOUTH ONCE DAILY FOR 2 WEEKS ON, 1 WEEK OFF, REPEAT EVERY 42 DAYS 28 capsule 1  . valsartan-hydrochlorothiazide (DIOVAN-HCT) 160-12.5 MG per tablet     . VOLTAREN 1 % GEL apply 3 grams to LARGE JOINTS three times a day if needed  0   No current facility-administered medications for this visit.    REVIEW OF SYSTEMS:   A 10+ POINT REVIEW OF SYSTEMS WAS OBTAINED including neurology, dermatology, psychiatry, cardiac,  respiratory, lymph, extremities, GI, GU, Musculoskeletal, constitutional, breasts, reproductive, HEENT.  All pertinent positives are noted in the HPI.  All others are negative.    PHYSICAL EXAMINATION: ECOG FS:1 - Symptomatic but completely ambulatory  Vitals:   08/25/19 1031  BP: (!) 178/72  Pulse: (!) 52  Resp: 18  Temp: 98.3 F (36.8 C)  SpO2: 100%   Wt Readings from Last 3 Encounters:  08/25/19 190 lb 1.6 oz (86.2 kg)  06/09/19 195 lb 11.2 oz (88.8 kg)  04/28/19 194 lb 3.2 oz (88.1 kg)   Body mass index is 27.28 kg/m.    GENERAL:alert, in no acute distress and comfortable SKIN: no acute rashes, no significant lesions EYES: conjunctiva are pink and non-injected, sclera anicteric OROPHARYNX: MMM, no exudates, no oropharyngeal erythema or ulceration NECK: supple, no JVD LYMPH:  no palpable lymphadenopathy in the cervical, axillary or inguinal regions LUNGS: clear to auscultation b/l with normal respiratory effort HEART: regular rate & rhythm ABDOMEN:  normoactive bowel sounds , non tender, not distended. Extremity:  +1 pedal edema  PSYCH: alert & oriented x 3 with fluent speech NEURO: no focal motor/sensory deficits      LABORATORY DATA:   I have reviewed the data as listed  . CBC Latest Ref Rng & Units 08/11/2019 06/09/2019 04/28/2019  WBC 4.0 - 10.5 K/uL 4.1 3.6(L) 4.6  Hemoglobin 13.0 - 17.0 g/dL 12.7(L) 13.4 14.3  Hematocrit 39.0 - 52.0 % 37.5(L) 38.4(L) 40.9  Platelets 150 - 400 K/uL 158 171 172  ANC 1.4k . CBC    Component Value Date/Time   WBC 4.1 08/11/2019 0822   RBC 3.38 (L) 08/11/2019 0822   HGB 12.7 (L) 08/11/2019 0822   HGB 13.3 02/28/2019 1243   HGB 14.0 06/11/2017 0808   HCT 37.5 (L) 08/11/2019 0822   HCT 40.7 06/11/2017 0808   PLT 158 08/11/2019 0822   PLT 173 02/28/2019 1243   PLT 223 06/11/2017 0808   MCV 110.9 (H) 08/11/2019 0822   MCV 112.1 (H) 06/11/2017 0808   MCH 37.6 (H) 08/11/2019 0822   MCHC 33.9 08/11/2019 0822   RDW 13.4  08/11/2019 0822   RDW 13.9 06/11/2017 0808   LYMPHSABS 1.5 08/11/2019 0822   LYMPHSABS 1.6 06/11/2017 0808   MONOABS 0.5 08/11/2019 0822   MONOABS 0.5  06/11/2017 0808   EOSABS 0.4 08/11/2019 0822   EOSABS 0.1 06/11/2017 0808   BASOSABS 0.0 08/11/2019 0822   BASOSABS 0.0 06/11/2017 0808   . CMP Latest Ref Rng & Units 08/11/2019 06/09/2019 04/28/2019  Glucose 70 - 99 mg/dL 139(H) 265(H) 171(H)  BUN 8 - 23 mg/dL 16 21 10   Creatinine 0.61 - 1.24 mg/dL 2.13(H) 1.88(H) 1.74(H)  Sodium 135 - 145 mmol/L 143 139 139  Potassium 3.5 - 5.1 mmol/L 4.7 4.7 4.3  Chloride 98 - 111 mmol/L 111 107 106  CO2 22 - 32 mmol/L 25 23 28   Calcium 8.9 - 10.3 mg/dL 7.4(L) 8.3(L) 8.5(L)  Total Protein 6.5 - 8.1 g/dL 4.8(L) 5.4(L) 5.7(L)  Total Bilirubin 0.3 - 1.2 mg/dL 0.8 1.2 1.5(H)  Alkaline Phos 38 - 126 U/L 103 107 113  AST 15 - 41 U/L 32 36 40  ALT 0 - 44 U/L 27 43 43   08/18/2019 CT Abdomen Pelvis Wo Contrast (Accession [id number])   08/18/2019 CT Chest Wo Contrast (Accession [id number])   RADIOGRAPHIC STUDIES: I have personally reviewed the radiological images as listed and agreed with the findings in the report. CT Abdomen Pelvis Wo Contrast  Result Date: 08/18/2019 CLINICAL DATA:  Metastatic renal cell carcinoma restaging EXAM: CT CHEST, ABDOMEN AND PELVIS WITHOUT CONTRAST TECHNIQUE: Multidetector CT imaging of the chest, abdomen and pelvis was performed following the standard protocol without IV contrast. COMPARISON:  Multiple exams, including 03/07/2019 FINDINGS: CT CHEST FINDINGS Cardiovascular: Coronary, aortic arch, and branch vessel atherosclerotic vascular disease. Mediastinum/Nodes: Thyroidectomy. No pathologic adenopathy identified. Lungs/Pleura: Local airspace opacity superiorly in the right middle lobe and anteriorly in the right lower lobe along the minor and major fissures, respectively, unchanged from prior examination and likely the result of prior radiation therapy. The right middle lobe  opacity measures approximately 1.4 cm in thickness, previously the same. Two small left lower lobe pulmonary nodules are identified on images 138 and 143 of series 4, likely reflecting treated metastatic lesions. The larger nodule on image 138/4 measures 0.5 by 0.3 cm, stable. No new nodules identified. Musculoskeletal: Fracture the right fourth rib with underlying lucency suspicious for a pathologic fracture. This has a generally similar configuration to the prior exam. CT ABDOMEN PELVIS FINDINGS Hepatobiliary: Unchanged pneumobilia. Punctate calcification posteriorly in the right hepatic lobe on image 65/2 likely from old granulomatous disease. Cholecystectomy. Pancreas: Prior Whipple procedure. Atrophic remaining pancreatic segment. Spleen: Splenectomy Adrenals/Urinary Tract: Right nephrectomy. Adrenal glands unremarkable. 1.1 cm in long axis left kidney lower pole nonobstructive renal calculus, image 53/5. Nondistended urinary bladder. Stomach/Bowel: Patent gastrojejunostomy. Nondistention of the colon with diffuse wall thickening of the colon most striking in the ascending colon and rectum. Possible wall thickening in the distal ileum. Air fluid levels in mostly nondistended proximal loops of jejunum although there is a mildly dilated loop of jejunum in the right upper quadrant measuring 3.7 cm in diameter. Orally administered contrast makes its way through to the rectum, and no obstruction is identified. Vascular/Lymphatic: Aortoiliac atherosclerotic vascular disease. No pathologic adenopathy identified. Reproductive: Mild prostatomegaly. Other: Mild but chronic appearing perirectal stranding Musculoskeletal: Unremarkable IMPRESSION: 1. No findings of progressive malignancy. Two small left lower lobe pulmonary nodules which are likely treated metastatic lesions. Stable appearance of the right fourth rib fracture with underlying lucency suspicious for a pathologic fracture. 2. Stable perifissural opacities in  the right lung likely from prior radiation therapy. 3. Nondistention of the colon with diffuse wall thickening, most striking in the ascending colon and rectum,  query colitis. There is some scattered air-levels in proximal loops of small bowel including one mildly dilated loop of right upper quadrant jejunum, but no findings of obstruction. 4. Other imaging findings of potential clinical significance: Coronary atherosclerosis. Stable pneumobilia. Prior Whipple procedure. Nonobstructive left nephrolithiasis. Mild prostatomegaly. Mild but chronic appearing perirectal stranding. Aortic Atherosclerosis (ICD10-I70.0). Electronically Signed   By: Van Clines M.D.   On: 08/18/2019 08:07   CT Chest Wo Contrast  Result Date: 08/18/2019 CLINICAL DATA:  Metastatic renal cell carcinoma restaging EXAM: CT CHEST, ABDOMEN AND PELVIS WITHOUT CONTRAST TECHNIQUE: Multidetector CT imaging of the chest, abdomen and pelvis was performed following the standard protocol without IV contrast. COMPARISON:  Multiple exams, including 03/07/2019 FINDINGS: CT CHEST FINDINGS Cardiovascular: Coronary, aortic arch, and branch vessel atherosclerotic vascular disease. Mediastinum/Nodes: Thyroidectomy. No pathologic adenopathy identified. Lungs/Pleura: Local airspace opacity superiorly in the right middle lobe and anteriorly in the right lower lobe along the minor and major fissures, respectively, unchanged from prior examination and likely the result of prior radiation therapy. The right middle lobe opacity measures approximately 1.4 cm in thickness, previously the same. Two small left lower lobe pulmonary nodules are identified on images 138 and 143 of series 4, likely reflecting treated metastatic lesions. The larger nodule on image 138/4 measures 0.5 by 0.3 cm, stable. No new nodules identified. Musculoskeletal: Fracture the right fourth rib with underlying lucency suspicious for a pathologic fracture. This has a generally similar  configuration to the prior exam. CT ABDOMEN PELVIS FINDINGS Hepatobiliary: Unchanged pneumobilia. Punctate calcification posteriorly in the right hepatic lobe on image 65/2 likely from old granulomatous disease. Cholecystectomy. Pancreas: Prior Whipple procedure. Atrophic remaining pancreatic segment. Spleen: Splenectomy Adrenals/Urinary Tract: Right nephrectomy. Adrenal glands unremarkable. 1.1 cm in long axis left kidney lower pole nonobstructive renal calculus, image 53/5. Nondistended urinary bladder. Stomach/Bowel: Patent gastrojejunostomy. Nondistention of the colon with diffuse wall thickening of the colon most striking in the ascending colon and rectum. Possible wall thickening in the distal ileum. Air fluid levels in mostly nondistended proximal loops of jejunum although there is a mildly dilated loop of jejunum in the right upper quadrant measuring 3.7 cm in diameter. Orally administered contrast makes its way through to the rectum, and no obstruction is identified. Vascular/Lymphatic: Aortoiliac atherosclerotic vascular disease. No pathologic adenopathy identified. Reproductive: Mild prostatomegaly. Other: Mild but chronic appearing perirectal stranding Musculoskeletal: Unremarkable IMPRESSION: 1. No findings of progressive malignancy. Two small left lower lobe pulmonary nodules which are likely treated metastatic lesions. Stable appearance of the right fourth rib fracture with underlying lucency suspicious for a pathologic fracture. 2. Stable perifissural opacities in the right lung likely from prior radiation therapy. 3. Nondistention of the colon with diffuse wall thickening, most striking in the ascending colon and rectum, query colitis. There is some scattered air-levels in proximal loops of small bowel including one mildly dilated loop of right upper quadrant jejunum, but no findings of obstruction. 4. Other imaging findings of potential clinical significance: Coronary atherosclerosis. Stable  pneumobilia. Prior Whipple procedure. Nonobstructive left nephrolithiasis. Mild prostatomegaly. Mild but chronic appearing perirectal stranding. Aortic Atherosclerosis (ICD10-I70.0). Electronically Signed   By: Van Clines M.D.   On: 08/18/2019 08:07     ASSESSMENT & PLAN:   69 y.o. Caucasian male with  #1 Recurrent metastatic Renal cell carcinoma with multiple lung metastases.  Has been on stable on 1st line Sutent for >41yr. Has had SBRT to the dominant right lung nodule with partial response. No lab or clinical evidence of metastatic  RCC progression at this time.  PET/CT 08/08/2016 with a couple of mildly enlarge LLL pulmonary nodules - no other overt evidence of RCC progression at this time.  PET CT scan 11/06/2016 - no overt evidence of RCC progression at this time .  PET/CT 03/13/2017 - no overt evidence of RCC progression at this time .  PET/CT 08/10/2017 - no overt evidence of RCC progression at this time .  PET/ CT done 08/10/2017, shows no evidence of  Progression at this time.  12/07/17 PET which revealed No findings suspicious for recurrent or metastatic disease.  03/22/18 PET/CT revealed One of the left lower lobe nodules has very minimally enlarged over the last 2 years, currently 0.6 by 0.4 cm and measuring 0.4 by 0.3 cm on 03/20/2016. This probably represents a previously treated metastatic nodule given that it measured 1.0 by 0.8 cm on 06/06/2015. This nodule may be subject to some slice selection phenomenon and also motion artifact given proximity to the diaphragm. Surveillance is likely warranted. 2. Chronic central lucency, marginal bony deposition, and slow healing of the right lateral fourth rib fracture. Possibilities may include underlying radiation necrosis as a cause for fracture leading to slow healing, or a low-grade pathologic lesion. Maximum SUV at this fracture site is 2.9, previously 2.4. 3. Stable band of radiation fibrosis in the right lung near the minor fissure,  in the vicinity of a prior pulmonary nodule. There is only low-grade activity in this vicinity characteristic of radiation fibrosis, without focal activity. 4. Other imaging findings of potential clinical significance: Aortic Atherosclerosis. Coronary atherosclerosis. Mild cardiomegaly. Right nephrectomy. 8 mm nonobstructive calculus in the left kidney lower pole.   08/24/18 CT C/A/P revealed "Stable small left lower lobe pulmonary nodules from recent prior studies. As previously noted, these have decreased in size from PET-CT 06/06/2015, likely treated metastases. No new or enlarging pulmonary nodules. 2. Postsurgical changes in the upper abdomen without evidence of local recurrence or metastatic disease. 3. Nonobstructing left renal calculus. 4. Stable radiation changes in the right upper lobe with probable chronic pathologic fracture of the right 4th rib laterally."  03/07/2019 CT C/A wo contrast revealed "1. Stable left lower lobe pulmonary nodules, likely treated Metastases. 2. No additional evidence of metastatic disease. 3. Left renal stone. 4. Post radiation scarring in the right hemithorax with a healed adjacent right rib fracture which is presumably pathologic in Etiology. 5.  Aortic atherosclerosis (ICD10-170.0). 6. 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 Confirmed by [place].  #4 mild thrombocytopenia likely related to Sutent- resolved with dose reduction. #5 Abnormal Liver function test - significant bump in transaminases. -resolved on labs today #6 grade 1 fatigue due to Sutent -better with dose reduction and changing schedule to 2 week on and 1 week. #7 Dysguesia from Sutent - manageable per patient. He is trying to eat as well as he can and is maintaining his body weight.  #8 RBC macrocytosis due to Sutent #9 chronic kidney disease/single kidney - creatinine stable @  1.5-1.9  Recommended to avoid NSAIDS and other nephrotoxic medications. Optimize control of DM2 #10 rheumatoid arthritis -follows with Dr. Estanislado Pandy for mx #11 DM2 -continue f/u with Dr Philip Aspen for continue mx, blood sugar at home per patient.    PLAN: -Discussed pt labwork today, 08/11/2019; all values are WNL except for RBC at 3.38, Hemoglobin at 12.7, HCT at 37.5, MCV at 110.9, MCH at 37.6, Neutro  Abs at 1.6, Glucose Bld at 139, Creatinine at 2.13, Calcium at 7.4, Total protein at 4.8, Albumin at 2.8, GFR Est Non AF Am at 31, GFR Est AFR Am at 36 . -Discussed registering for COVID-19 Vaccination. He has registered for the vaccination but has not received a date. -Discussed CT Chest Wo Contrast (Accession [id number]) and  CT Abdomen Pelvis Wo Contrast (Accession [id number] on 08/18/2019 with results revealing "1. No findings of progressive malignancy. Two small left lower lobe pulmonary nodules which are likely treated metastatic lesions.Stable appearance of the right fourth rib fracture with underlying lucency suspicious for a pathologic fracture.2. Stable perifissural opacities in the right lung likely from prior radiation therapy. 3. Nondistention of the colon with diffuse wall thickening, most striking in the ascending colon and rectum, query colitis. There is some scattered air-levels in proximal loops of small bowel includingone mildly dilated loop of right upper quadrant jejunum, but no findings of obstruction.4. Other imaging findings of potential clinical significance:Coronary atherosclerosis. Stable pneumobilia. Prior Whipple procedure. Nonobstructive left nephrolithiasis. Mild prostatomegaly.Mild but chronic appearing perirectal stranding." -Discussed WBC at 4.1 -Discussed Hemoglobin 12.7 -Discussed Creatinine at 2.13 -Discussed his concerns with sexual dysfunction which is likely multifactorial -- will defer need for urology consultation to PCP. FOLLOW UP: RTC with Dr Irene Limbo with  labs in 8 weeks  The total time spent in the appt was 25 minutes and more than 50% was on counseling and direct patient cares.  All of the patient's questions were answered with apparent satisfaction. The patient knows to call the clinic with any problems, questions or concerns.     Sullivan Lone MD MS AAHIVMS [facility] Hematology/Oncology Physician [facility]  (Office):       [phone] (Work cell):  [phone] (Fax):           [phone]  I, Scot Dock, am acting as a scribe for Dr. Sullivan Lone.   .I have reviewed the above documentation for accuracy and completeness, and I agree with the above. Brunetta Genera MD

## 2019-08-25 ENCOUNTER — Inpatient Hospital Stay: Payer: Medicare Other | Admitting: Hematology

## 2019-08-25 ENCOUNTER — Other Ambulatory Visit: Payer: Self-pay

## 2019-08-25 VITALS — BP 178/72 | HR 52 | Temp 98.3°F | Resp 18 | Ht 70.0 in | Wt 190.1 lb

## 2019-08-25 DIAGNOSIS — C7801 Secondary malignant neoplasm of right lung: Secondary | ICD-10-CM | POA: Diagnosis not present

## 2019-08-25 DIAGNOSIS — C7900 Secondary malignant neoplasm of unspecified kidney and renal pelvis: Secondary | ICD-10-CM

## 2019-08-25 DIAGNOSIS — C641 Malignant neoplasm of right kidney, except renal pelvis: Secondary | ICD-10-CM | POA: Diagnosis not present

## 2019-08-26 ENCOUNTER — Telehealth: Payer: Self-pay | Admitting: Hematology

## 2019-08-26 NOTE — Telephone Encounter (Signed)
Scheduled appt per 1/21 los.  Was not able to get in contact pt.  Sent a message to HIM pool to get a calendar mailed out. 

## 2019-09-06 ENCOUNTER — Telehealth: Payer: Self-pay

## 2019-09-06 MED FILL — SUTENT 37.5 MG CAPSULE: 37.5 | 21 days supply | Qty: 14 | Fill #1

## 2019-09-06 NOTE — Telephone Encounter (Signed)
Oral Oncology Patient Advocate Encounter   Was successful in securing patient a $7000 grant from [employer] to provide copayment coverage for Sutent.  This will keep the out of pocket expense at $0.        The billing information is as follows and has been shared with [facility].   Member ID: [id number] Group ID: [id number] RxBin: [id number] PCN: [id number] Dates of Eligibility: 08/06/19 through 08/05/20  Fund name:  Renal Cell.  [place] [facility] [place] Phone [phone] Fax [phone] 09/06/2019 10:08 AM

## 2019-09-18 ENCOUNTER — Ambulatory Visit: Payer: Medicare Other | Attending: Internal Medicine

## 2019-09-18 DIAGNOSIS — Z23 Encounter for immunization: Secondary | ICD-10-CM | POA: Insufficient documentation

## 2019-09-18 NOTE — Progress Notes (Signed)
   Covid-19 Vaccination Clinic  Name:  NIKE SOUTHWELL    MRN: [id number] DOB: 05/09/1951  09/18/2019  Mr. Salminen was observed post Covid-19 immunization for 15 minutes without incidence. He was provided with Vaccine Information Sheet and instruction to access the V-Safe system.   Mr. Pellow was instructed to call 911 with any severe reactions post vaccine: Marland Kitchen Difficulty breathing  . Swelling of your face and throat  . A fast heartbeat  . A bad rash all over your body  . Dizziness and weakness    Immunizations Administered    Name Date Dose VIS Date Route   Pfizer COVID-19 Vaccine 09/18/2019  9:51 AM 0.3 mL 07/15/2019 Intramuscular   Manufacturer: [place]   Lot: [id number]   [place]: [id number]

## 2019-09-22 MED FILL — SUTENT 37.5 MG CAPSULE: 37.5 | 21 days supply | Qty: 14 | Fill #2

## 2019-10-11 ENCOUNTER — Ambulatory Visit: Payer: Medicare Other | Attending: Internal Medicine

## 2019-10-11 DIAGNOSIS — Z23 Encounter for immunization: Secondary | ICD-10-CM | POA: Insufficient documentation

## 2019-10-11 NOTE — Progress Notes (Signed)
   Covid-19 Vaccination Clinic  Name:  Dylan Jefferson    MRN: [id number] DOB: May 03, 1951  10/11/2019  Dylan Jefferson was observed post Covid-19 immunization for 15 minutes without incident. He was provided with Vaccine Information Sheet and instruction to access the V-Safe system.   Dylan Jefferson was instructed to call 911 with any severe reactions post vaccine: Marland Kitchen Difficulty breathing  . Swelling of face and throat  . A fast heartbeat  . A bad rash all over body  . Dizziness and weakness   Immunizations Administered    Name Date Dose VIS Date Route   Pfizer COVID-19 Vaccine 10/11/2019  9:20 AM 0.3 mL 07/15/2019 Intramuscular   Manufacturer: [employer]   Lot: [id number]   [place]: [id number]

## 2019-10-17 ENCOUNTER — Other Ambulatory Visit: Payer: Self-pay | Admitting: Hematology

## 2019-10-17 DIAGNOSIS — C649 Malignant neoplasm of unspecified kidney, except renal pelvis: Secondary | ICD-10-CM

## 2019-10-18 MED FILL — SUTENT 37.5 MG CAPSULE: 37.5 | 21 days supply | Qty: 14 | Fill #0

## 2019-10-19 NOTE — Progress Notes (Signed)
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 2) Remote history of metastatic renal cell carcinoma Diagnosed with renal cell carcinoma 20 years ago and had a right nephrectomy. About 8 years after that he was noted to have abdominal recurrence in his pancreas spleen and small intestine and had a Whipple's procedure and significant abdominal surgery and notes that 10 out of 17 lymph nodes were positive. He also had his gallbladder removed. Postoperative course was complicated by an internal hemorrhage as per his report. Patient notes 2-3 years after that he had recurrence in his thyroid that led to a thyroidectomy. [June 2004] later he had partial gastrectomy for local recurrence. [February 2005] when he presented with GI bleeding. Patient notes that he has had no known evidence of recurrence over the last 8 years until his recent CT scan showed lung nodules.  Current Treatment: Sutent 37.5 mg po daily for 2 weeks on and 1 weeks off.  Previous treatment Sutent on cycle 1 - 50 mg by mouth daily for 4 weeks on and 2-weeks off. It was dose adjusted to 37.5 mg by mouth daily for 2 weeks with 1 week of to help mitigate issues with fatigue, mild hyperbilirubinemia, cytopenias. SBRT to dominant RML nodule.   INTERVAL HISTORY:  Dylan Jefferson is here for for his scheduled follow-up of his metastatic renal cell carcinoma. The patient's last visit with [place] was on 08/25/2019. The pt reports that he is doing well overall.  The pt reports he is doing good and very energetic. The weather and his arthritis has been difficult for him. Pt has  gotten both COVID19 vaccination. He will be traveling in May to see his grandson Writer. Pt has been have lots of rheumatoid arthritis pain. His knees, hands, and shoulders have been hurting but hot showers and copper fit gloves have been helping. The changes in taste has been the same. He has been able to do baking for several hours but after he gets tired.  Lab results today (10/20/19) of CBC w/diff and CMP is as follows: all values are WNL except for RBC at 3.46, HCT at 38.5, MCV at 111.3, MCH at 38.7,  Glucose at 106, Creatinine at 1.88, Calcium at 8.2, Total Protein at 5.2, Albumin at 3.0, Total Bilirubin at 1.5, GFR, Est Non Af Am at 36, GFR, Est AFR Am at 41.  On review of systems, pt reports rheumatoid arthritis pain, healthy appetite, sleeping well, and denies diahrrea, changes in breathing, abdominal pain, bone pain, headaches and any other symptoms.    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 Past Surgical History:  Procedure Laterality Date   CATARACT EXTRACTION Bilateral 2013   CHOLECYSTECTOMY     GASTRECTOMY     tumor removed   LIPOMA EXCISION Right 1999   NEPHRECTOMY Right    PANCREATECTOMY     SPLENECTOMY     THYROIDECTOMY     URETERAL STENT PLACEMENT Left 2012   WHIPPLE PROCEDURE      . Social History   Tobacco Use   Smoking  status: Former Smoker    Years: 1.50   Smokeless tobacco: Never Used  Substance Use Topics   Alcohol use: Yes    Comment: occasional beer   Drug use: No    ALLERGIES:  has No Known Allergies.  MEDICATIONS:  Current Outpatient Medications  Medication Sig Dispense Refill   amLODipine (NORVASC) 10 MG tablet take 1 tablet by mouth once daily (Patient not taking: Reported on 03/31/2018) 90 tablet 3   Ascorbic Acid (VITAMIN C) 1000 MG tablet Take 1,000 mg by mouth daily.     aspirin 81 MG tablet Take 81 mg by mouth daily.     atenolol (TENORMIN) 100 MG tablet      augmented betamethasone dipropionate (DIPROLENE-AF) 0.05 % cream   0   calcium citrate-vitamin D (CITRACAL+D) 315-200 MG-UNIT per tablet Take 2 tablets by mouth daily.      Cyanocobalamin (VITAMIN B-12) 2500 MCG SUBL Take by mouth daily.      docusate sodium (COLACE) 100 MG capsule Take 100 mg by mouth 2 (two) times daily.     dorzolamide-timolol (COSOPT) 22.3-6.8 MG/ML ophthalmic solution place 1 drop into affected eye twice a day  1   famotidine (PEPCID) 20 MG tablet Take 20 mg by mouth 2 (two) times daily.     ferrous sulfate 325 (65 FE) MG tablet Take 325 mg by mouth 2 (two) times daily with a meal.      Glucosamine-MSM-Hyaluronic Acd (JOINT HEALTH) 750-375-30 MG TABS Take 2 tablets by mouth daily.     insulin lispro (HUMALOG) 100 UNIT/ML injection Inject into the skin 3 (three) times daily before meals.     Iron-Vitamins (GERITOL COMPLETE) TABS Take 1 tablet by mouth daily.     Lactobacillus (ACIDOPHILUS) CAPS capsule Take 1 capsule by mouth daily.     latanoprost (XALATAN) 0.005 % ophthalmic solution place 1 drop into both eyes every evening  1   LEVEMIR 100 UNIT/ML injection Inject 7 Units into the skin 2 (two) times daily.   1   levothyroxine (SYNTHROID, LEVOTHROID) 300 MCG tablet Take 300 mcg by mouth 3 (three) times a week.      lovastatin (MEVACOR) 40 MG tablet Take 40 mg by mouth at bedtime.       minoxidil (LONITEN) 10 MG tablet Take 10 mg by mouth 2 (two) times daily.      Multiple Vitamin (MULTIVITAMIN) tablet Take 1 tablet by mouth daily.     Omega-3 Fatty Acids (FISH OIL) 1200 MG CAPS Take 1 capsule by mouth daily.     Pancrelipase, Lip-Prot-Amyl, (CREON) 6000 UNITS CPEP Take 4 capsules by mouth 3 (three) times daily.     potassium citrate (UROCIT-K) 10 MEQ (1080 MG) SR tablet Take 10 mEq by mouth 2 (two) times daily.      SUTENT 37.5 MG capsule TAKE 1 CAPSULE (37.5 MG) BY MOUTH ONCE DAILY FOR 2 WEEKS ON, 1 WEEK OFF, REPEAT EVERY 42 DAYS 14 capsule 1  valsartan-hydrochlorothiazide (DIOVAN-HCT) 160-12.5 MG per tablet      VOLTAREN 1 % GEL apply 3 grams to LARGE JOINTS three times a day if needed  0   No current facility-administered medications for this visit.    REVIEW OF SYSTEMS:   A 10+ POINT REVIEW OF SYSTEMS WAS OBTAINED including neurology, dermatology, psychiatry, cardiac, respiratory, lymph, extremities, GI, GU, Musculoskeletal, constitutional, breasts, reproductive, HEENT.  All pertinent positives are noted in the HPI.  All others are negative.    PHYSICAL EXAMINATION: ECOG FS:1 - Symptomatic but completely ambulatory  Vitals:   10/20/19 1104  BP: (!) 147/72  Pulse: (!) 53  Resp: 18  Temp: (!) 97.4 F (36.3 C)  SpO2: 99%   Wt Readings from Last 3 Encounters:  10/20/19 193 lb 6.4 oz (87.7 kg)  08/25/19 190 lb 1.6 oz (86.2 kg)  06/09/19 195 lb 11.2 oz (88.8 kg)   Body mass index is 27.75 kg/m.      GENERAL:alert, in no acute distress and comfortable SKIN: no acute rashes, no significant lesions EYES: conjunctiva are pink and non-injected, sclera anicteric OROPHARYNX: MMM, no exudates, no oropharyngeal erythema or ulceration NECK: supple, no JVD LYMPH:  no palpable lymphadenopathy in the cervical, axillary or inguinal regions LUNGS: clear to auscultation b/l with normal respiratory effort HEART: regular rate & rhythm ABDOMEN:  normoactive bowel  sounds , non tender, not distended. Extremity: 2+ pedal edema PSYCH: alert & oriented x 3 with fluent speech NEURO: no focal motor/sensory deficits   LABORATORY DATA:   I have reviewed the data as listed  . CBC Latest Ref Rng & Units 10/20/2019 08/11/2019 06/09/2019  WBC 4.0 - 10.5 K/uL 4.4 4.1 3.6(L)  Hemoglobin 13.0 - 17.0 g/dL 13.4 12.7(L) 13.4  Hematocrit 39.0 - 52.0 % 38.5(L) 37.5(L) 38.4(L)  Platelets 150 - 400 K/uL 232 158 171  ANC 1.4k . CBC    Component Value Date/Time   WBC 4.4 10/20/2019 0951   RBC 3.46 (L) 10/20/2019 0951   HGB 13.4 10/20/2019 0951   HGB 13.3 02/28/2019 1243   HGB 14.0 06/11/2017 0808   HCT 38.5 (L) 10/20/2019 0951   HCT 40.7 06/11/2017 0808   PLT 232 10/20/2019 0951   PLT 173 02/28/2019 1243   PLT 223 06/11/2017 0808   MCV 111.3 (H) 10/20/2019 0951   MCV 112.1 (H) 06/11/2017 0808   MCH 38.7 (H) 10/20/2019 0951   MCHC 34.8 10/20/2019 0951   RDW 13.9 10/20/2019 0951   RDW 13.9 06/11/2017 0808   LYMPHSABS 1.7 10/20/2019 0951   LYMPHSABS 1.6 06/11/2017 0808   MONOABS 0.5 10/20/2019 0951   MONOABS 0.5 06/11/2017 0808   EOSABS 0.1 10/20/2019 0951   EOSABS 0.1 06/11/2017 0808   BASOSABS 0.0 10/20/2019 0951   BASOSABS 0.0 06/11/2017 0808   . CMP Latest Ref Rng & Units 10/20/2019 08/11/2019 06/09/2019  Glucose 70 - 99 mg/dL 106(H) 139(H) 265(H)  BUN 8 - 23 mg/dL 17 16 21   Creatinine 0.61 - 1.24 mg/dL 1.88(H) 2.13(H) 1.88(H)  Sodium 135 - 145 mmol/L 143 143 139  Potassium 3.5 - 5.1 mmol/L 4.5 4.7 4.7  Chloride 98 - 111 mmol/L 108 111 107  CO2 22 - 32 mmol/L 28 25 23   Calcium 8.9 - 10.3 mg/dL 8.2(L) 7.4(L) 8.3(L)  Total Protein 6.5 - 8.1 g/dL 5.2(L) 4.8(L) 5.4(L)  Total Bilirubin 0.3 - 1.2 mg/dL 1.5(H) 0.8 1.2  Alkaline Phos 38 - 126 U/L 103 103 107  AST 15 - 41 U/L 39 32 36  ALT 0 - 44 U/L 37 27 43   08/18/2019 CT Abdomen Pelvis Wo Contrast (Accession [id number])   08/18/2019 CT Chest Wo Contrast (Accession [id number])   RADIOGRAPHIC  STUDIES: I have personally reviewed the radiological images as listed and agreed with the findings in the report. No results found.   ASSESSMENT & PLAN:   69 y.o. Caucasian male with  #1 Recurrent metastatic Renal cell carcinoma with multiple lung metastases.  Has been on stable on 1st line Sutent for >52yr. Has had SBRT to the dominant right lung nodule with partial response. No lab or clinical evidence of metastatic RCC progression at this time.  PET/CT 08/08/2016 with a couple of mildly enlarge LLL pulmonary nodules - no other overt evidence of RCC progression at this time.  PET CT scan 11/06/2016 - no overt evidence of RCC progression at this time .  PET/CT 03/13/2017 - no overt evidence of RCC progression at this time .  PET/CT 08/10/2017 - no overt evidence of RCC progression at this time .  PET/ CT done 08/10/2017, shows no evidence of  Progression at this time.  12/07/17 PET which revealed No findings suspicious for recurrent or metastatic disease.  03/22/18 PET/CT revealed One of the left lower lobe nodules has very minimally enlarged over the last 2 years, currently 0.6 by 0.4 cm and measuring 0.4 by 0.3 cm on 03/20/2016. This probably represents a previously treated metastatic nodule given that it measured 1.0 by 0.8 cm on 06/06/2015. This nodule may be subject to some slice selection phenomenon and also motion artifact given proximity to the diaphragm. Surveillance is likely warranted. 2. Chronic central lucency, marginal bony deposition, and slow healing of the right lateral fourth rib fracture. Possibilities may include underlying radiation necrosis as a cause for fracture leading to slow healing, or a low-grade pathologic lesion. Maximum SUV at this fracture site is 2.9, previously 2.4. 3. 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 There is only low-grade activity in this vicinity characteristic of radiation  fibrosis, without focal activity. 4. Other imaging findings of potential clinical significance: Aortic Atherosclerosis. Coronary atherosclerosis. Mild cardiomegaly. Right nephrectomy. 8 mm nonobstructive calculus in the left kidney lower pole.   08/24/18 CT C/A/P revealed "Stable small left lower lobe pulmonary nodules from recent prior studies. As previously noted, these have decreased in size from PET-CT 06/06/2015, likely treated metastases. No new or enlarging pulmonary nodules. 2. Postsurgical changes in the upper abdomen without evidence of local recurrence or metastatic disease. 3. Nonobstructing left renal calculus. 4. Stable radiation changes in the right upper lobe with probable chronic pathologic fracture of the right 4th rib laterally."  03/07/2019 CT C/A wo contrast revealed "1. Stable left lower lobe pulmonary nodules, likely treated Metastases. 2. No additional evidence of metastatic disease. 3. Left renal stone. 4. Post radiation scarring in the right hemithorax with a healed adjacent right rib fracture which is presumably pathologic in Etiology. 5.  Aortic atherosclerosis (ICD10-170.0). 6. 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 Confirmed by [place].  #4 mild thrombocytopenia likely related to Sutent- resolved with dose reduction. #5 Abnormal Liver function test - significant bump in transaminases. -resolved on labs today #6 grade 1 fatigue due to Sutent -better with dose reduction and changing schedule to 2 week on and 1 week. #7 Dysguesia from Sutent - manageable  per patient. He is trying to eat as well as he can and is maintaining his body weight.  #8 RBC macrocytosis due to Sutent #9 chronic kidney disease/single kidney - creatinine stable @ 1.5-1.9  Recommended to avoid NSAIDS and other nephrotoxic medications. Optimize control of DM2 #10 rheumatoid arthritis  -follows with Dr. Estanislado Pandy for mx #11 DM2 -continue f/u with Dr Philip Aspen for continue mx, blood sugar at home per patient.    PLAN: -Discussed pt labwork today, 10/20/19; of CBC w/diff and CMP is as follows: all values are WNL except for RBC at 3.46, HCT at 38.5, MCV at 111.3, MCH at 38.7,  Glucose at 106, Creatinine at 1.88, Calcium at 8.2, Total Protein at 5.2, Albumin at 3.0, Total Bilirubin at 1.5, GFR, Est Non Af Am at 36, GFR, Est AFR Am at 41. - on lab or clinical evidence of progression of metastatic RCC at this time -no prohibitive toxicities from ongoing Sutent therapy at this time.  -Will see back in 8 weeks   FOLLOW UP: RTC with Dr Irene Limbo with labs in 8 weeks   The total time spent in the appt was 20 minutes and more than 50% was on counseling and direct patient cares.  All of the patient's questions were answered with apparent satisfaction. The patient knows to call the clinic with any problems, questions or concerns.  Dylan Lone MD [place] AAHIVMS [facility] Hematology/Oncology Physician [facility]  (Office):       [phone] (Work cell):  [phone] (Fax):           [phone]  I, Dylan Jefferson am acting as a scribe for Dr. Sullivan Jefferson.   .I have reviewed the above documentation for accuracy and completeness, and I agree with the above. Dylan Genera MD

## 2019-10-20 ENCOUNTER — Inpatient Hospital Stay: Payer: Medicare Other

## 2019-10-20 ENCOUNTER — Inpatient Hospital Stay: Payer: Medicare Other | Attending: Hematology | Admitting: Hematology

## 2019-10-20 ENCOUNTER — Other Ambulatory Visit: Payer: Self-pay

## 2019-10-20 VITALS — BP 147/72 | HR 53 | Temp 97.4°F | Resp 18 | Ht 70.0 in | Wt 193.4 lb

## 2019-10-20 DIAGNOSIS — Z87891 Personal history of nicotine dependence: Secondary | ICD-10-CM | POA: Diagnosis not present

## 2019-10-20 DIAGNOSIS — M069 Rheumatoid arthritis, unspecified: Secondary | ICD-10-CM | POA: Insufficient documentation

## 2019-10-20 DIAGNOSIS — Z85850 Personal history of malignant neoplasm of thyroid: Secondary | ICD-10-CM | POA: Insufficient documentation

## 2019-10-20 DIAGNOSIS — I10 Essential (primary) hypertension: Secondary | ICD-10-CM | POA: Insufficient documentation

## 2019-10-20 DIAGNOSIS — E119 Type 2 diabetes mellitus without complications: Secondary | ICD-10-CM | POA: Diagnosis not present

## 2019-10-20 DIAGNOSIS — F329 Major depressive disorder, single episode, unspecified: Secondary | ICD-10-CM | POA: Insufficient documentation

## 2019-10-20 DIAGNOSIS — E039 Hypothyroidism, unspecified: Secondary | ICD-10-CM | POA: Insufficient documentation

## 2019-10-20 DIAGNOSIS — Z7982 Long term (current) use of aspirin: Secondary | ICD-10-CM | POA: Diagnosis not present

## 2019-10-20 DIAGNOSIS — R945 Abnormal results of liver function studies: Secondary | ICD-10-CM | POA: Diagnosis not present

## 2019-10-20 DIAGNOSIS — C7800 Secondary malignant neoplasm of unspecified lung: Secondary | ICD-10-CM | POA: Insufficient documentation

## 2019-10-20 DIAGNOSIS — C641 Malignant neoplasm of right kidney, except renal pelvis: Secondary | ICD-10-CM | POA: Diagnosis not present

## 2019-10-20 DIAGNOSIS — C7900 Secondary malignant neoplasm of unspecified kidney and renal pelvis: Secondary | ICD-10-CM

## 2019-10-20 DIAGNOSIS — Z905 Acquired absence of kidney: Secondary | ICD-10-CM | POA: Diagnosis not present

## 2019-10-20 DIAGNOSIS — C7801 Secondary malignant neoplasm of right lung: Secondary | ICD-10-CM

## 2019-10-20 DIAGNOSIS — D696 Thrombocytopenia, unspecified: Secondary | ICD-10-CM | POA: Diagnosis not present

## 2019-10-20 DIAGNOSIS — Z79899 Other long term (current) drug therapy: Secondary | ICD-10-CM | POA: Insufficient documentation

## 2019-10-20 LAB — CMP (CANCER CENTER ONLY)
ALT: 37 U/L (ref 0–44)
AST: 39 U/L (ref 15–41)
Albumin: 3 g/dL — ABNORMAL LOW (ref 3.5–5.0)
Alkaline Phosphatase: 103 U/L (ref 38–126)
Anion gap: 7 (ref 5–15)
BUN: 17 mg/dL (ref 8–23)
CO2: 28 mmol/L (ref 22–32)
Calcium: 8.2 mg/dL — ABNORMAL LOW (ref 8.9–10.3)
Chloride: 108 mmol/L (ref 98–111)
Creatinine: 1.88 mg/dL — ABNORMAL HIGH (ref 0.61–1.24)
GFR, Est AFR Am: 41 mL/min — ABNORMAL LOW (ref >60)
GFR, Estimated: 36 mL/min — ABNORMAL LOW (ref >60)
Glucose, Bld: 106 mg/dL — ABNORMAL HIGH (ref 70–99)
Potassium: 4.5 mmol/L (ref 3.5–5.1)
Sodium: 143 mmol/L (ref 135–145)
Total Bilirubin: 1.5 mg/dL — ABNORMAL HIGH (ref 0.3–1.2)
Total Protein: 5.2 g/dL — ABNORMAL LOW (ref 6.5–8.1)

## 2019-10-20 LAB — CBC WITH DIFFERENTIAL/PLATELET
Abs Immature Granulocytes: 0.01 10*3/uL (ref 0.00–0.07)
Basophils Absolute: 0 10*3/uL (ref 0.0–0.1)
Basophils Relative: 1 %
Eosinophils Absolute: 0.1 10*3/uL (ref 0.0–0.5)
Eosinophils Relative: 2 %
HCT: 38.5 % — ABNORMAL LOW (ref 39.0–52.0)
Hemoglobin: 13.4 g/dL (ref 13.0–17.0)
Immature Granulocytes: 0 %
Lymphocytes Relative: 38 %
Lymphs Abs: 1.7 10*3/uL (ref 0.7–4.0)
MCH: 38.7 pg — ABNORMAL HIGH (ref 26.0–34.0)
MCHC: 34.8 g/dL (ref 30.0–36.0)
MCV: 111.3 fL — ABNORMAL HIGH (ref 80.0–100.0)
Monocytes Absolute: 0.5 10*3/uL (ref 0.1–1.0)
Monocytes Relative: 11 %
Neutro Abs: 2.1 10*3/uL (ref 1.7–7.7)
Neutrophils Relative %: 48 %
Platelets: 232 10*3/uL (ref 150–400)
RBC: 3.46 MIL/uL — ABNORMAL LOW (ref 4.22–5.81)
RDW: 13.9 % (ref 11.5–15.5)
WBC: 4.4 10*3/uL (ref 4.0–10.5)
nRBC: 0 % (ref 0.0–0.2)

## 2019-11-09 ENCOUNTER — Other Ambulatory Visit: Payer: Self-pay | Admitting: Hematology

## 2019-11-09 DIAGNOSIS — C649 Malignant neoplasm of unspecified kidney, except renal pelvis: Secondary | ICD-10-CM

## 2019-11-11 ENCOUNTER — Other Ambulatory Visit: Payer: Self-pay | Admitting: *Deleted

## 2019-11-11 DIAGNOSIS — C649 Malignant neoplasm of unspecified kidney, except renal pelvis: Secondary | ICD-10-CM

## 2019-11-11 MED ORDER — SUNITINIB MALATE 37.5 MG PO CAPS: ORAL_CAPSULE | ORAL | 0 refills | Status: DC

## 2019-11-11 MED FILL — SUTENT 37.5 MG CAPSULE: 37.5 | 21 days supply | Qty: 14 | Fill #0

## 2019-11-11 NOTE — Telephone Encounter (Signed)
Patient called. He would like to fill Sutent prescription for 28 pills. This will be a 6 week supply. He takes for 14 days on and 7 days off. Dr. Irene Limbo informed. Dr. Irene Limbo gave verbal order to refill Sutent for 28 pills.  [place] to inform of change. Einar Pheasant states the new prescription has been received and will be filled for pick up today.  Patient contacted with this information. Verbalized understanding.

## 2019-11-28 MED FILL — SUTENT 37.5 MG CAPSULE: 37.5 | 21 days supply | Qty: 14 | Fill #1

## 2019-12-20 MED FILL — SUTENT 37.5 MG CAPSULE: 37.5 | 21 days supply | Qty: 14 | Fill #2

## 2019-12-21 NOTE — Progress Notes (Signed)
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 2) Remote history of metastatic renal cell carcinoma Diagnosed with renal cell carcinoma 20 years ago and had a right nephrectomy. About 8 years after that he was noted to have abdominal recurrence in his pancreas spleen and small intestine and had a Whipple's procedure and significant abdominal surgery and notes that 10 out of 17 lymph nodes were positive. He also had his gallbladder removed. Postoperative course was complicated by an internal hemorrhage as per his report. Patient notes 2-3 years after that he had recurrence in his thyroid that led to a thyroidectomy. [June 2004] later he had partial gastrectomy for local recurrence. [February 2005] when he presented with GI bleeding. Patient notes that he has had no known evidence of recurrence over the last 8 years until his recent CT scan showed lung nodules.  Current Treatment: Sutent 37.5 mg po daily for 2 weeks on and 1 weeks off.  Previous treatment Sutent on cycle 1 - 50 mg by mouth daily for 4 weeks on and 2-weeks off. It was dose adjusted to 37.5 mg by mouth daily for 2 weeks with 1 week of to help mitigate issues with fatigue, mild hyperbilirubinemia, cytopenias. SBRT to dominant RML nodule.   INTERVAL HISTORY:  Dylan Jefferson is here for for his scheduled follow-up of his metastatic renal cell carcinoma. The patient's last visit with [place] was on 10/20/19. The pt reports that he is doing well overall.  The pt reports he is doing good. He has gotten both doses of COVID19 vaccine. His arthritis has been bothering him since  he mowed the lawn the yesterday. Pt has been taking Preservision supplement for his eyes that was added recently. He has been eating well and sleeping well. Pt is tolerating Sutent well. He does not take Ibuprofen.   Lab results today (12/22/19) of CBC w/diff and CMP is as follows: all values are WNL except for WBC at 3.5K ,RBC at 3.28, Hemoglobin at 12.3, HCT at 36.6, MCV at 111.6, MCH at 37.5, Neutro Abs at 1.6K, Glucose at 216, Creatinine at 2.13, Calcium at 8.0, total Protein at 4.7, Albumin at 2.7, Total Bilirubin at 1.3, GFR, Est Non Af Am at 30, GFR, Est AFR Am at at 35  On review of systems, pt reports healthy appetite, sleeping well and denies mouth soars, nausea, diarrhea and any other symptoms.   Past Medical History:  Diagnosis Date  . Arthritis   . Cancer [facility])    Renal cell  . DDD (degenerative disc disease), cervical 05/28/2016  . DDD (degenerative disc disease), lumbar 05/28/2016  . Depression   . Diabetes ([place])   . Hyperlipidemia 05/28/2016  . Hypertension   . Hypothyroidism   . 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 Kidney disease   . Nephrolithiasis   . Osteoarthritis of both hands 05/28/2016  . Osteoarthritis of both knees 05/28/2016  . Peripheral vascular disorder ([place]) 05/28/2016  . 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 Rheumatoid arthritis ([place])   . Thyroid cancer ([place]) 05/28/2016    . Past Surgical History:  Procedure Laterality Date  . CATARACT EXTRACTION Bilateral 2013  .  CHOLECYSTECTOMY    . GASTRECTOMY     tumor removed  . LIPOMA EXCISION Right 1999  . NEPHRECTOMY Right   . PANCREATECTOMY    . SPLENECTOMY    . THYROIDECTOMY    . URETERAL STENT PLACEMENT Left 2012  . WHIPPLE PROCEDURE      . Social History   Tobacco Use  . Smoking status: Former Smoker    Years: 1.50  . Smokeless tobacco: Never Used  Substance Use Topics  . Alcohol use: Yes    Comment: occasional beer  . Drug use: No     ALLERGIES:  has No Known Allergies.  MEDICATIONS:  Current Outpatient Medications  Medication Sig Dispense Refill  . amLODipine (NORVASC) 10 MG tablet take 1 tablet by mouth once daily (Patient not taking: Reported on 03/31/2018) 90 tablet 3  . Ascorbic Acid (VITAMIN C) 1000 MG tablet Take 1,000 mg by mouth daily.    Marland Kitchen aspirin 81 MG tablet Take 81 mg by mouth daily.    Marland Kitchen atenolol (TENORMIN) 100 MG tablet     . augmented betamethasone dipropionate (DIPROLENE-AF) 0.05 % cream   0  . calcium citrate-vitamin D (CITRACAL+D) 315-200 MG-UNIT per tablet Take 2 tablets by mouth daily.     . Cyanocobalamin (VITAMIN B-12) 2500 MCG SUBL Take by mouth daily.     Marland Kitchen docusate sodium (COLACE) 100 MG capsule Take 100 mg by mouth 2 (two) times daily.    . dorzolamide-timolol (COSOPT) 22.3-6.8 MG/ML ophthalmic solution place 1 drop into affected eye twice a day  1  . famotidine (PEPCID) 20 MG tablet Take 20 mg by mouth 2 (two) times daily.    . ferrous sulfate 325 (65 FE) MG tablet Take 325 mg by mouth 2 (two) times daily with a meal.     . Glucosamine-MSM-Hyaluronic Acd (JOINT HEALTH) 750-375-30 MG TABS Take 2 tablets by mouth daily.    . insulin lispro (HUMALOG) 100 UNIT/ML injection Inject into the skin 3 (three) times daily before meals.    . Iron-Vitamins (GERITOL COMPLETE) TABS Take 1 tablet by mouth daily.    . Lactobacillus (ACIDOPHILUS) CAPS capsule Take 1 capsule by mouth daily.    Marland Kitchen latanoprost (XALATAN) 0.005 % ophthalmic solution place 1 drop into both eyes every evening  1  . LEVEMIR 100 UNIT/ML injection Inject 7 Units into the skin 2 (two) times daily.   1  . levothyroxine (SYNTHROID, LEVOTHROID) 300 MCG tablet Take 300 mcg by mouth 3 (three) times a week.     . lovastatin (MEVACOR) 40 MG tablet Take 40 mg by mouth at bedtime.     . minoxidil (LONITEN) 10 MG tablet Take 10 mg by mouth 2 (two) times daily.     . Multiple Vitamin (MULTIVITAMIN) tablet Take 1 tablet by mouth daily.    .  Omega-3 Fatty Acids (FISH OIL) 1200 MG CAPS Take 1 capsule by mouth daily.    . Pancrelipase, Lip-Prot-Amyl, (CREON) 6000 UNITS CPEP Take 4 capsules by mouth 3 (three) times daily.    . potassium citrate (UROCIT-K) 10 MEQ (1080 MG) SR tablet Take 10 mEq by mouth 2 (two) times daily.     . SUNItinib (SUTENT) 37.5 MG capsule TAKE 1 CAPSULE (37.5 MG) BY MOUTH ONCE DAILY FOR 2 WEEKS ON, 1 WEEK OFF 42 capsule 0  . valsartan-hydrochlorothiazide (DIOVAN-HCT) 160-12.5 MG per tablet     . VOLTAREN 1 % GEL apply 3 grams to LARGE JOINTS three times a day if needed  0  No current facility-administered medications for this visit.    REVIEW OF SYSTEMS:   A 10+ POINT REVIEW OF SYSTEMS WAS OBTAINED including neurology, dermatology, psychiatry, cardiac, respiratory, lymph, extremities, GI, GU, Musculoskeletal, constitutional, breasts, reproductive, HEENT.  All pertinent positives are noted in the HPI.  All others are negative.   PHYSICAL EXAMINATION: ECOG FS:1 - Symptomatic but completely ambulatory  Vitals:   12/22/19 0950  BP: (!) 145/73  Pulse: (!) 52  Resp: 18  Temp: 97.7 F (36.5 C)  SpO2: 98%   Wt Readings from Last 3 Encounters:  12/22/19 194 lb 14.4 oz (88.4 kg)  10/20/19 193 lb 6.4 oz (87.7 kg)  08/25/19 190 lb 1.6 oz (86.2 kg)   Body mass index is 27.97 kg/m.      GENERAL:alert, in no acute distress and comfortable SKIN: no acute rashes, no significant lesions EYES: conjunctiva are pink and non-injected, sclera anicteric OROPHARYNX: MMM, no exudates, no oropharyngeal erythema or ulceration NECK: supple, no JVD LYMPH:  no palpable lymphadenopathy in the cervical, axillary or inguinal regions LUNGS: clear to auscultation b/l with normal respiratory effort HEART: regular rate & rhythm ABDOMEN:  normoactive bowel sounds , non tender, not distended. Extremity: no pedal edema PSYCH: alert & oriented x 3 with fluent speech NEURO: no focal motor/sensory deficits  LABORATORY DATA:    I have reviewed the data as listed  . CBC Latest Ref Rng & Units 12/22/2019 10/20/2019 08/11/2019  WBC 4.0 - 10.5 K/uL 3.5(L) 4.4 4.1  Hemoglobin 13.0 - 17.0 g/dL 12.3(L) 13.4 12.7(L)  Hematocrit 39.0 - 52.0 % 36.6(L) 38.5(L) 37.5(L)  Platelets 150 - 400 K/uL 203 232 158  ANC 1.4k . CBC    Component Value Date/Time   WBC 3.5 (L) 12/22/2019 0857   RBC 3.28 (L) 12/22/2019 0857   HGB 12.3 (L) 12/22/2019 0857   HGB 13.3 02/28/2019 1243   HGB 14.0 06/11/2017 0808   HCT 36.6 (L) 12/22/2019 0857   HCT 40.7 06/11/2017 0808   PLT 203 12/22/2019 0857   PLT 173 02/28/2019 1243   PLT 223 06/11/2017 0808   MCV 111.6 (H) 12/22/2019 0857   MCV 112.1 (H) 06/11/2017 0808   MCH 37.5 (H) 12/22/2019 0857   MCHC 33.6 12/22/2019 0857   RDW 13.9 12/22/2019 0857   RDW 13.9 06/11/2017 0808   LYMPHSABS 1.4 12/22/2019 0857   LYMPHSABS 1.6 06/11/2017 0808   MONOABS 0.4 12/22/2019 0857   MONOABS 0.5 06/11/2017 0808   EOSABS 0.1 12/22/2019 0857   EOSABS 0.1 06/11/2017 0808   BASOSABS 0.0 12/22/2019 0857   BASOSABS 0.0 06/11/2017 0808   . CMP Latest Ref Rng & Units 12/22/2019 10/20/2019 08/11/2019  Glucose 70 - 99 mg/dL 216(H) 106(H) 139(H)  BUN 8 - 23 mg/dL 22 17 16   Creatinine 0.61 - 1.24 mg/dL 2.13(H) 1.88(H) 2.13(H)  Sodium 135 - 145 mmol/L 141 143 143  Potassium 3.5 - 5.1 mmol/L 5.1 4.5 4.7  Chloride 98 - 111 mmol/L 109 108 111  CO2 22 - 32 mmol/L 27 28 25   Calcium 8.9 - 10.3 mg/dL 8.0(L) 8.2(L) 7.4(L)  Total Protein 6.5 - 8.1 g/dL 4.7(L) 5.2(L) 4.8(L)  Total Bilirubin 0.3 - 1.2 mg/dL 1.3(H) 1.5(H) 0.8  Alkaline Phos 38 - 126 U/L 116 103 103  AST 15 - 41 U/L 40 39 32  ALT 0 - 44 U/L 38 37 27   08/18/2019 CT Abdomen Pelvis Wo Contrast (Accession [id number])   08/18/2019 CT Chest Wo Contrast (Accession [id number])   RADIOGRAPHIC STUDIES:  I have personally reviewed the radiological images as listed and agreed with the findings in the report. No results found.   ASSESSMENT & PLAN:    69 y.o. Caucasian male with  #1 Recurrent metastatic Renal cell carcinoma with multiple lung metastases.  Has been on stable on 1st line Sutent for >85yr. Has had SBRT to the dominant right lung nodule with partial response. No lab or clinical evidence of metastatic RCC progression at this time.  PET/CT 08/08/2016 with a couple of mildly enlarge LLL pulmonary nodules - no other overt evidence of RCC progression at this time.  PET CT scan 11/06/2016 - no overt evidence of RCC progression at this time .  PET/CT 03/13/2017 - no overt evidence of RCC progression at this time .  PET/CT 08/10/2017 - no overt evidence of RCC progression at this time .  PET/ CT done 08/10/2017, shows no evidence of  Progression at this time.  12/07/17 PET which revealed No findings suspicious for recurrent or metastatic disease.  03/22/18 PET/CT revealed One of the left lower lobe nodules has very minimally enlarged over the last 2 years, currently 0.6 by 0.4 cm and measuring 0.4 by 0.3 cm on 03/20/2016. This probably represents a previously treated metastatic nodule given that it measured 1.0 by 0.8 cm on 06/06/2015. This nodule may be subject to some slice selection phenomenon and also motion artifact given proximity to the diaphragm. Surveillance is likely warranted. 2. Chronic central lucency, marginal bony deposition, and slow healing of the right lateral fourth rib fracture. Possibilities may include underlying radiation necrosis as a cause for fracture leading to slow healing, or a low-grade pathologic lesion. Maximum SUV at this fracture site is 2.9, previously 2.4. 3. 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 There is only low-grade activity in this vicinity characteristic of radiation fibrosis, without focal activity. 4. Other imaging findings of potential clinical significance: Aortic Atherosclerosis. Coronary atherosclerosis. Mild  cardiomegaly. Right nephrectomy. 8 mm nonobstructive calculus in the left kidney lower pole.   08/24/18 CT C/A/P revealed "Stable small left lower lobe pulmonary nodules from recent prior studies. As previously noted, these have decreased in size from PET-CT 06/06/2015, likely treated metastases. No new or enlarging pulmonary nodules. 2. Postsurgical changes in the upper abdomen without evidence of local recurrence or metastatic disease. 3. Nonobstructing left renal calculus. 4. Stable radiation changes in the right upper lobe with probable chronic pathologic fracture of the right 4th rib laterally."  03/07/2019 CT C/A wo contrast revealed "1. Stable left lower lobe pulmonary nodules, likely treated Metastases. 2. No additional evidence of metastatic disease. 3. Left renal stone. 4. Post radiation scarring in the right hemithorax with a healed adjacent right rib fracture which is presumably pathologic in Etiology. 5.  Aortic atherosclerosis (ICD10-170.0). 6. 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 Confirmed by [place].  #4 mild thrombocytopenia likely related to Sutent- resolved with dose reduction. #5 Abnormal Liver function test - significant bump in transaminases. -resolved on labs today #6 grade 1 fatigue due to Sutent -better with dose reduction and changing schedule to 2 week on and 1 week. #7 Dysguesia from Sutent - manageable per patient. He is trying to eat as well as he can and is maintaining his body weight.  #8 RBC macrocytosis due to Sutent #9 chronic kidney disease/single kidney -  creatinine stable @ 1.5-1.9  Recommended to avoid NSAIDS and other nephrotoxic medications. Optimize control of DM2 #10 rheumatoid arthritis -follows with Dr. Estanislado Pandy for mx #11 DM2 -continue f/u with Dr Philip Aspen for continue mx, blood sugar at home per patient.    PLAN: -Discussed pt  labwork today, 12/22/19; of CBC w/diff and CMP is as follows: all values are WNL except for WBC at 3.5K ,RBC at 3.28, Hemoglobin at 12.3, HCT at 36.6, MCV at 111.6, MCH at 37.5, Neutro Abs at 1.6K, Glucose at 216, Creatinine at 2.13, Calcium at 8.0, total Protein at 4.7, Albumin at 2.7, Total Bilirubin at 1.3, GFR, Est Non Af Am at 30, GFR, Est AFR Am at at 35 -Advised on COVID19 vaccine -possibility of needing booster, masks around crowds, traveling for family  -Advised on staying hydrated and watching glucose -Advised not taking Ibuprofen -On lab or clinical evidence of progression of metastatic RCC at this time -No prohibitive toxicities from ongoing Sutent therapy at this time.  -Will get repeat CT Chest/Abd/Pelvis scans before next visit  -Will see back in 3 months with every 2 cycles   FOLLOW UP: CT Chest/abd/pelvis in 10 weeks RTC with Dr Irene Limbo with labs in 12 weeks  The total time spent in the appt was 25 minutes and more than 50% was on counseling and direct patient cares.  All of the patient's questions were answered with apparent satisfaction. The patient knows to call the clinic with any problems, questions or concerns.  Sullivan Lone MD [place] AAHIVMS [facility] Hematology/Oncology Physician [facility]  (Office):       [phone] (Work cell):  [phone] (Fax):           [phone]  I, Dawayne Cirri am acting as a scribe for Dr. Sullivan Lone.   .I have reviewed the above documentation for accuracy and completeness, and I agree with the above. Brunetta Genera MD

## 2019-12-22 ENCOUNTER — Inpatient Hospital Stay: Payer: Medicare Other | Attending: Hematology | Admitting: Hematology

## 2019-12-22 ENCOUNTER — Other Ambulatory Visit: Payer: Self-pay

## 2019-12-22 ENCOUNTER — Inpatient Hospital Stay: Payer: Medicare Other

## 2019-12-22 VITALS — BP 145/73 | HR 52 | Temp 97.7°F | Resp 18 | Ht 70.0 in | Wt 194.9 lb

## 2019-12-22 DIAGNOSIS — Z85850 Personal history of malignant neoplasm of thyroid: Secondary | ICD-10-CM | POA: Insufficient documentation

## 2019-12-22 DIAGNOSIS — C7900 Secondary malignant neoplasm of unspecified kidney and renal pelvis: Secondary | ICD-10-CM | POA: Diagnosis not present

## 2019-12-22 DIAGNOSIS — E119 Type 2 diabetes mellitus without complications: Secondary | ICD-10-CM | POA: Insufficient documentation

## 2019-12-22 DIAGNOSIS — C7800 Secondary malignant neoplasm of unspecified lung: Secondary | ICD-10-CM | POA: Insufficient documentation

## 2019-12-22 DIAGNOSIS — C7801 Secondary malignant neoplasm of right lung: Secondary | ICD-10-CM | POA: Diagnosis not present

## 2019-12-22 DIAGNOSIS — F329 Major depressive disorder, single episode, unspecified: Secondary | ICD-10-CM | POA: Insufficient documentation

## 2019-12-22 DIAGNOSIS — Z7982 Long term (current) use of aspirin: Secondary | ICD-10-CM | POA: Diagnosis not present

## 2019-12-22 DIAGNOSIS — I10 Essential (primary) hypertension: Secondary | ICD-10-CM | POA: Diagnosis not present

## 2019-12-22 DIAGNOSIS — Z794 Long term (current) use of insulin: Secondary | ICD-10-CM | POA: Diagnosis not present

## 2019-12-22 DIAGNOSIS — Z905 Acquired absence of kidney: Secondary | ICD-10-CM | POA: Insufficient documentation

## 2019-12-22 DIAGNOSIS — Z79899 Other long term (current) drug therapy: Secondary | ICD-10-CM | POA: Insufficient documentation

## 2019-12-22 DIAGNOSIS — E039 Hypothyroidism, unspecified: Secondary | ICD-10-CM | POA: Diagnosis not present

## 2019-12-22 DIAGNOSIS — Z87891 Personal history of nicotine dependence: Secondary | ICD-10-CM | POA: Diagnosis not present

## 2019-12-22 DIAGNOSIS — C641 Malignant neoplasm of right kidney, except renal pelvis: Secondary | ICD-10-CM | POA: Insufficient documentation

## 2019-12-22 LAB — CBC WITH DIFFERENTIAL/PLATELET
Abs Immature Granulocytes: 0.01 K/uL (ref 0.00–0.07)
Basophils Absolute: 0 K/uL (ref 0.0–0.1)
Basophils Relative: 1 %
Eosinophils Absolute: 0.1 K/uL (ref 0.0–0.5)
Eosinophils Relative: 1 %
HCT: 36.6 % — ABNORMAL LOW (ref 39.0–52.0)
Hemoglobin: 12.3 g/dL — ABNORMAL LOW (ref 13.0–17.0)
Immature Granulocytes: 0 %
Lymphocytes Relative: 40 %
Lymphs Abs: 1.4 K/uL (ref 0.7–4.0)
MCH: 37.5 pg — ABNORMAL HIGH (ref 26.0–34.0)
MCHC: 33.6 g/dL (ref 30.0–36.0)
MCV: 111.6 fL — ABNORMAL HIGH (ref 80.0–100.0)
Monocytes Absolute: 0.4 K/uL (ref 0.1–1.0)
Monocytes Relative: 11 %
Neutro Abs: 1.6 K/uL — ABNORMAL LOW (ref 1.7–7.7)
Neutrophils Relative %: 47 %
Platelets: 203 K/uL (ref 150–400)
RBC: 3.28 MIL/uL — ABNORMAL LOW (ref 4.22–5.81)
RDW: 13.9 % (ref 11.5–15.5)
WBC: 3.5 K/uL — ABNORMAL LOW (ref 4.0–10.5)
nRBC: 0 % (ref 0.0–0.2)

## 2019-12-22 LAB — CMP (CANCER CENTER ONLY)
ALT: 38 U/L (ref 0–44)
AST: 40 U/L (ref 15–41)
Albumin: 2.7 g/dL — ABNORMAL LOW (ref 3.5–5.0)
Alkaline Phosphatase: 116 U/L (ref 38–126)
Anion gap: 5 (ref 5–15)
BUN: 22 mg/dL (ref 8–23)
CO2: 27 mmol/L (ref 22–32)
Calcium: 8 mg/dL — ABNORMAL LOW (ref 8.9–10.3)
Chloride: 109 mmol/L (ref 98–111)
Creatinine: 2.13 mg/dL — ABNORMAL HIGH (ref 0.61–1.24)
GFR, Est AFR Am: 35 mL/min — ABNORMAL LOW
GFR, Estimated: 30 mL/min — ABNORMAL LOW
Glucose, Bld: 216 mg/dL — ABNORMAL HIGH (ref 70–99)
Potassium: 5.1 mmol/L (ref 3.5–5.1)
Sodium: 141 mmol/L (ref 135–145)
Total Bilirubin: 1.3 mg/dL — ABNORMAL HIGH (ref 0.3–1.2)
Total Protein: 4.7 g/dL — ABNORMAL LOW (ref 6.5–8.1)

## 2020-01-03 ENCOUNTER — Other Ambulatory Visit: Payer: Self-pay | Admitting: Hematology

## 2020-01-03 DIAGNOSIS — C649 Malignant neoplasm of unspecified kidney, except renal pelvis: Secondary | ICD-10-CM

## 2020-01-03 NOTE — Telephone Encounter (Signed)
Patient request refill

## 2020-01-04 ENCOUNTER — Other Ambulatory Visit: Payer: Self-pay | Admitting: Hematology

## 2020-01-04 DIAGNOSIS — C649 Malignant neoplasm of unspecified kidney, except renal pelvis: Secondary | ICD-10-CM

## 2020-01-09 ENCOUNTER — Other Ambulatory Visit: Payer: Self-pay | Admitting: Pharmacist

## 2020-01-09 DIAGNOSIS — C649 Malignant neoplasm of unspecified kidney, except renal pelvis: Secondary | ICD-10-CM

## 2020-01-09 MED ORDER — SUNITINIB MALATE 37.5 MG PO CAPS: ORAL_CAPSULE | ORAL | 0 refills | Status: DC

## 2020-01-26 ENCOUNTER — Other Ambulatory Visit: Payer: Self-pay | Admitting: Hematology

## 2020-01-26 DIAGNOSIS — C649 Malignant neoplasm of unspecified kidney, except renal pelvis: Secondary | ICD-10-CM

## 2020-01-26 NOTE — Telephone Encounter (Signed)
Patient request refill

## 2020-01-27 MED FILL — SUTENT 37.5 MG CAPSULE: 37.5 | 21 days supply | Qty: 14 | Fill #0

## 2020-02-22 MED FILL — SUTENT 37.5 MG CAPSULE: 37.5 | 21 days supply | Qty: 14 | Fill #1

## 2020-03-06 ENCOUNTER — Other Ambulatory Visit: Payer: Self-pay

## 2020-03-06 ENCOUNTER — Encounter (HOSPITAL_COMMUNITY): Payer: Self-pay

## 2020-03-06 ENCOUNTER — Ambulatory Visit (HOSPITAL_COMMUNITY)
Admission: RE | Admit: 2020-03-06 | Discharge: 2020-03-06 | Disposition: A | Discharge status: Home / Self Care | Payer: Medicare Other | Source: Ambulatory Visit | Attending: Hematology | Admitting: Hematology

## 2020-03-06 DIAGNOSIS — C7900 Secondary malignant neoplasm of unspecified kidney and renal pelvis: Secondary | ICD-10-CM | POA: Insufficient documentation

## 2020-03-06 DIAGNOSIS — C7801 Secondary malignant neoplasm of right lung: Secondary | ICD-10-CM | POA: Diagnosis present

## 2020-03-07 ENCOUNTER — Other Ambulatory Visit: Payer: Self-pay | Admitting: Hematology

## 2020-03-07 DIAGNOSIS — C649 Malignant neoplasm of unspecified kidney, except renal pelvis: Secondary | ICD-10-CM

## 2020-03-14 MED FILL — SUTENT 37.5 MG CAPSULE: 37.5 | 21 days supply | Qty: 14 | Fill #0

## 2020-03-14 NOTE — Progress Notes (Signed)
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 2) Remote history of metastatic renal cell carcinoma Diagnosed with renal cell carcinoma 20 years ago and had a right nephrectomy. About 8 years after that he was noted to have abdominal recurrence in his pancreas spleen and small intestine and had a Whipple's procedure and significant abdominal surgery and notes that 10 out of 17 lymph nodes were positive. He also had his gallbladder removed. Postoperative course was complicated by an internal hemorrhage as per his report. Patient notes 2-3 years after that he had recurrence in his thyroid that led to a thyroidectomy. [June 2004] later he had partial gastrectomy for local recurrence. [February 2005] when he presented with GI bleeding. Patient notes that he has had no known evidence of recurrence over the last 8 years until his recent CT scan showed lung nodules.  Current Treatment: Sutent 37.5 mg po daily for 2 weeks on and 1 weeks off.  Previous treatment Sutent on cycle 1 - 50 mg by mouth daily for 4 weeks on and 2-weeks off. It was dose adjusted to 37.5 mg by mouth daily for 2 weeks with 1 week of to help mitigate issues with fatigue, mild hyperbilirubinemia, cytopenias. SBRT to dominant RML nodule.   INTERVAL HISTORY:  Dylan Jefferson is here for for his scheduled follow-up of his metastatic renal cell carcinoma. Pt is joined by his wife. The patient's last visit with [place] was on 12/22/19. The pt reports that he is doing well overall.  The pt reports he has a ton of swelling that came on suddenly. He went and saw a Dr. Sharlett Iles  and got his medications changed. The swelling is coming down. He is not seeing a nephrologist but is seeing a urologist. Pt has been in a bad mood recently due to all the changes. His joints have been bothering him. Pt has not been SOB. His fluid weight is down by about 20 pounds but he still has about 10 extra pounds from his normal. Pt has not had an [place] of his heart in the last several years. His blood sugars had been stable but he was going to get a meter but the coordination to getting the meter has been having trouble.   Of note since the patient's last visit, pt has had CT Abdomen Pelvis Wo Contrast ([id number]) completed on 03/06/20 with results revealing "1. No new or progressive metastatic disease. Tiny left lower lobe pulmonary nodules are stable. Stable postradiation change in the right mid lung. Stable ununited pathologic anterior right fourth rib fracture with underlying mixed lytic and sclerotic change. 2. Evidence of new third-spacing of fluid including small left pleural effusion, mild to moderate anasarca and small volume ascites. Mild right colonic wall thickening is nonspecific and also probably due to noninflammatory edema. 3. Chronic findings include: Coronary atherosclerosis. Nonobstructing left nephrolithiasis. Aortic Atherosclerosis (ICD10-I70.0)." Pt had CT Chest Wo Contrast ([id number]) completed on 03/06/20 with results revealing "1. No new or progressive metastatic disease. Tiny left lower lobe pulmonary nodules are stable. Stable postradiation change in the right mid lung. Stable ununited pathologic anterior right fourth rib fracture with underlying mixed lytic and sclerotic change. 2. Evidence of new  third-spacing of fluid including small left pleural effusion, mild to moderate anasarca and small volume ascites. Mild right colonic wall thickening is nonspecific and also probably due to noninflammatory edema. 3. Chronic findings include: Coronary atherosclerosis. Nonobstructing left  nephrolithiasis. Aortic Atherosclerosis (ICD10-I70.0)."  Lab results today (03/15/20) of CBC w/diff and CMP is as follows: all values are WNL except for RBC at 3.09, Hemoglobin at 12, HCT at 35.6, MCV at 115.2, MCH at 38.8, RDW at 16.3, Chloride at 112, CO2 at 19, Glucose at 240, BUN at 32, Creatinine at 2.85, Calcium at 8.1, Total protein at 4.8, Albumin at 2.5, Alkaline Phosphatase at 135, GFR, Est Non Af Am at 21, GFR, Est AFR Am at 25  On review of systems, pt reports edema, healthy appetite, back pain and denies SOB, blood in urine, and any other symptoms.   Past Medical History:  Diagnosis Date  . Arthritis   . Cancer [facility])    Renal cell  . DDD (degenerative disc disease), cervical 05/28/2016  . DDD (degenerative disc disease), lumbar 05/28/2016  . Depression   . Diabetes ([place])   . Hyperlipidemia 05/28/2016  . Hypertension   . Hypothyroidism   . 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 Kidney disease   . Nephrolithiasis   . Osteoarthritis of both hands 05/28/2016  . Osteoarthritis of both knees 05/28/2016  . Peripheral vascular disorder ([place]) 05/28/2016  . 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 Rheumatoid arthritis ([place])   . Thyroid cancer ([place]) 05/28/2016    . Past Surgical History:  Procedure Laterality Date  . CATARACT EXTRACTION Bilateral 2013  . CHOLECYSTECTOMY    . GASTRECTOMY     tumor removed  . LIPOMA EXCISION Right 1999  . NEPHRECTOMY Right   . PANCREATECTOMY    . SPLENECTOMY    . THYROIDECTOMY    . URETERAL STENT PLACEMENT Left 2012  . WHIPPLE PROCEDURE      . Social History   Tobacco Use  . Smoking status: Former Smoker    Years: 1.50  . Smokeless tobacco: Never Used  Vaping Use  . Vaping Use: Never used  Substance Use Topics  . Alcohol use: Yes    Comment: occasional beer  . Drug use: No    ALLERGIES:  has No Known Allergies.  MEDICATIONS:  Current Outpatient Medications  Medication Sig  Dispense Refill  . amLODipine (NORVASC) 10 MG tablet take 1 tablet by mouth once daily (Patient not taking: Reported on 03/31/2018) 90 tablet 3  . Ascorbic Acid (VITAMIN C) 1000 MG tablet Take 1,000 mg by mouth daily.    Marland Kitchen aspirin 81 MG tablet Take 81 mg by mouth daily.    Marland Kitchen atenolol (TENORMIN) 100 MG tablet     . augmented betamethasone dipropionate (DIPROLENE-AF) 0.05 % cream   0  . calcium citrate-vitamin D (CITRACAL+D) 315-200 MG-UNIT per tablet Take 2 tablets by mouth daily.     . Cyanocobalamin (VITAMIN B-12) 2500 MCG SUBL Take by mouth daily.     Marland Kitchen docusate sodium (COLACE) 100 MG capsule Take 100 mg by mouth 2 (two) times daily.    . dorzolamide-timolol (COSOPT) 22.3-6.8 MG/ML ophthalmic solution place 1 drop into affected eye twice a day  1  . famotidine (PEPCID) 20 MG tablet Take 20 mg by mouth 2 (two) times daily.    . ferrous sulfate 325 (65 FE) MG tablet Take 325 mg by mouth 2 (two) times daily with a meal.     .  Glucosamine-MSM-Hyaluronic Acd (JOINT HEALTH) 750-375-30 MG TABS Take 2 tablets by mouth daily.    . insulin lispro (HUMALOG) 100 UNIT/ML injection Inject into the skin 3 (three) times daily before meals.    . Iron-Vitamins (GERITOL COMPLETE) TABS Take 1 tablet by mouth daily.    . Lactobacillus (ACIDOPHILUS) CAPS capsule Take 1 capsule by mouth daily.    Marland Kitchen latanoprost (XALATAN) 0.005 % ophthalmic solution place 1 drop into both eyes every evening  1  . LEVEMIR 100 UNIT/ML injection Inject 7 Units into the skin 2 (two) times daily.   1  . levothyroxine (SYNTHROID, LEVOTHROID) 300 MCG tablet Take 300 mcg by mouth 3 (three) times a week.     . lovastatin (MEVACOR) 40 MG tablet Take 40 mg by mouth at bedtime.     . minoxidil (LONITEN) 10 MG tablet Take 10 mg by mouth 2 (two) times daily.     . Multiple Vitamin (MULTIVITAMIN) tablet Take 1 tablet by mouth daily.    . Omega-3 Fatty Acids (FISH OIL) 1200 MG CAPS Take 1 capsule by mouth daily.    . Pancrelipase, Lip-Prot-Amyl,  (CREON) 6000 UNITS CPEP Take 4 capsules by mouth 3 (three) times daily.    . potassium citrate (UROCIT-K) 10 MEQ (1080 MG) SR tablet Take 10 mEq by mouth 2 (two) times daily.     . SUTENT 37.5 MG capsule TAKE 1 CAPSULE (37.5 MG) BY MOUTH ONCE DAILY FOR 2 WEEKS ON, 1 WEEK OFF 14 capsule 0  . valsartan-hydrochlorothiazide (DIOVAN-HCT) 160-12.5 MG per tablet     . VOLTAREN 1 % GEL apply 3 grams to LARGE JOINTS three times a day if needed  0   No current facility-administered medications for this visit.    REVIEW OF SYSTEMS:   A 10+ POINT REVIEW OF SYSTEMS WAS OBTAINED including neurology, dermatology, psychiatry, cardiac, respiratory, lymph, extremities, GI, GU, Musculoskeletal, constitutional, breasts, reproductive, HEENT.  All pertinent positives are noted in the HPI.  All others are negative.   PHYSICAL EXAMINATION: ECOG FS:1 - Symptomatic but completely ambulatory  Vitals:   03/15/20 1009  BP: 123/75  Pulse: (!) 54  Resp: 18  Temp: (!) 96.6 F (35.9 C)  SpO2: 98%   Wt Readings from Last 3 Encounters:  03/15/20 205 lb 1.6 oz (93 kg)  12/22/19 194 lb 14.4 oz (88.4 kg)  10/20/19 193 lb 6.4 oz (87.7 kg)   Body mass index is 29.43 kg/m.      GENERAL:alert, in no acute distress and comfortable SKIN: no acute rashes, no significant lesions EYES: conjunctiva are pink and non-injected, sclera anicteric OROPHARYNX: MMM, no exudates, no oropharyngeal erythema or ulceration NECK: supple, no JVD LYMPH:  no palpable lymphadenopathy in the cervical, axillary or inguinal regions LUNGS: clear to auscultation b/l with normal respiratory effort HEART: regular rate & rhythm ABDOMEN:  normoactive bowel sounds , non tender, not distended. Extremity: no pedal edema PSYCH: alert & oriented x 3 with fluent speech NEURO: no focal motor/sensory deficits   LABORATORY DATA:   I have reviewed the data as listed  . CBC Latest Ref Rng & Units 03/15/2020 12/22/2019 10/20/2019  WBC 4.0 - 10.5 K/uL  4.2 3.5(L) 4.4  Hemoglobin 13.0 - 17.0 g/dL 12.0(L) 12.3(L) 13.4  Hematocrit 39 - 52 % 35.6(L) 36.6(L) 38.5(L)  Platelets 150 - 400 K/uL 261 203 232  ANC 1.4k . CBC    Component Value Date/Time   WBC 4.2 03/15/2020 0911   RBC 3.09 (L) 03/15/2020 5956  HGB 12.0 (L) 03/15/2020 0911   HGB 13.3 02/28/2019 1243   HGB 14.0 06/11/2017 0808   HCT 35.6 (L) 03/15/2020 0911   HCT 40.7 06/11/2017 0808   PLT 261 03/15/2020 0911   PLT 173 02/28/2019 1243   PLT 223 06/11/2017 0808   MCV 115.2 (H) 03/15/2020 0911   MCV 112.1 (H) 06/11/2017 0808   MCH 38.8 (H) 03/15/2020 0911   MCHC 33.7 03/15/2020 0911   RDW 16.3 (H) 03/15/2020 0911   RDW 13.9 06/11/2017 0808   LYMPHSABS 1.3 03/15/2020 0911   LYMPHSABS 1.6 06/11/2017 0808   MONOABS 0.4 03/15/2020 0911   MONOABS 0.5 06/11/2017 0808   EOSABS 0.2 03/15/2020 0911   EOSABS 0.1 06/11/2017 0808   BASOSABS 0.0 03/15/2020 0911   BASOSABS 0.0 06/11/2017 0808   . CMP Latest Ref Rng & Units 03/15/2020 12/22/2019 10/20/2019  Glucose 70 - 99 mg/dL 240(H) 216(H) 106(H)  BUN 8 - 23 mg/dL 32(H) 22 17  Creatinine 0.61 - 1.24 mg/dL 2.85(H) 2.13(H) 1.88(H)  Sodium 135 - 145 mmol/L 137 141 143  Potassium 3.5 - 5.1 mmol/L 4.9 5.1 4.5  Chloride 98 - 111 mmol/L 112(H) 109 108  CO2 22 - 32 mmol/L 19(L) 27 28  Calcium 8.9 - 10.3 mg/dL 8.1(L) 8.0(L) 8.2(L)  Total Protein 6.5 - 8.1 g/dL 4.8(L) 4.7(L) 5.2(L)  Total Bilirubin 0.3 - 1.2 mg/dL 0.9 1.3(H) 1.5(H)  Alkaline Phos 38 - 126 U/L 135(H) 116 103  AST 15 - 41 U/L 40 40 39  ALT 0 - 44 U/L 33 38 37   08/18/2019 CT Abdomen Pelvis Wo Contrast (Accession [id number])   08/18/2019 CT Chest Wo Contrast (Accession [id number])   RADIOGRAPHIC STUDIES: I have personally reviewed the radiological images as listed and agreed with the findings in the report. CT Abdomen Pelvis Wo Contrast  Result Date: 03/06/2020 CLINICAL DATA:  Metastatic renal cell carcinoma. Thyroid cancer. Restaging. EXAM: CT CHEST, ABDOMEN AND  PELVIS WITHOUT CONTRAST TECHNIQUE: Multidetector CT imaging of the chest, abdomen and pelvis was performed following the standard protocol without IV contrast. COMPARISON:  08/18/2019 CT chest, abdomen and pelvis. FINDINGS: CT CHEST FINDINGS Cardiovascular: Normal heart size. No significant pericardial effusion/thickening. Left anterior descending and right coronary atherosclerosis. Atherosclerotic nonaneurysmal thoracic aorta. Normal caliber pulmonary arteries. Mediastinum/Nodes: Total thyroidectomy. Unremarkable esophagus. No pathologically enlarged axillary, mediastinal or hilar lymph nodes, noting limited sensitivity for the detection of hilar adenopathy on this noncontrast study. Lungs/Pleura: No pneumothorax. No right pleural effusion. Small dependent left pleural effusion, new. Sharply marginated bandlike foci of consolidation in the right middle lobe and anterior right lower lobe with associated mild volume loss and distortion, unchanged, compatible with postradiation change. Two stable tiny left lower lobe pulmonary nodules, largest 4 mm (series 6/image 147). No acute consolidative airspace disease, lung masses or new significant pulmonary nodules. Musculoskeletal: Stable ununited pathologic anterior right fourth rib fracture with underlying mixed lytic and sclerotic change. No new focal osseous lesions in the chest. Minimal thoracic spondylosis. New mild anasarca. CT ABDOMEN PELVIS FINDINGS Hepatobiliary: Normal liver size. Stable granulomatous posterior right liver subcentimeter calcification. No liver masses. Cholecystectomy. Stable postsurgical changes from choledochojejunostomy with pneumobilia. Intrahepatic bile ducts are nondilated. 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 Spleen: Surgically absent. Adrenals/Urinary Tract: Normal adrenals. Right nephrectomy with no discrete mass or fluid collection in the right nephrectomy bed. No left  hydronephrosis. Nonobstructing 6 mm lower left renal stone. No contour deforming left renal mass. Normal nondistended bladder.  Stomach/Bowel: Postsurgical changes from distal gastrectomy with gastrojejunostomy with no acute gastric abnormality. No unexpected small bowel dilatation or definite small bowel wall thickening. Oral contrast transits to the right colon. Appendix not discretely visualized. Collapsed large bowel with stable mild right colonic wall thickening and no acute large bowel wall thickening or acute pericolonic fat stranding. No diverticulosis. Vascular/Lymphatic: Atherosclerotic nonaneurysmal abdominal aorta. No pathologically enlarged lymph nodes in the abdomen or pelvis. Reproductive: Top-normal size prostate. Other: No pneumoperitoneum. New small volume ascites, predominantly in the pelvis. No focal fluid collection. Mild to moderate anasarca is new. Musculoskeletal: No aggressive appearing focal osseous lesions. Mild lumbar spondylosis. IMPRESSION: 1. No new or progressive metastatic disease. Tiny left lower lobe pulmonary nodules are stable. Stable postradiation change in the right mid lung. Stable ununited pathologic anterior right fourth rib fracture with underlying mixed lytic and sclerotic change. 2. Evidence of new third-spacing of fluid including small left pleural effusion, mild to moderate anasarca and small volume ascites. Mild right colonic wall thickening is nonspecific and also probably due to noninflammatory edema. 3. Chronic findings include: Coronary atherosclerosis. Nonobstructing left nephrolithiasis. Aortic Atherosclerosis (ICD10-I70.0). Electronically Signed   By: Ilona Sorrel M.D.   On: 03/06/2020 15:28   CT Chest Wo Contrast  Result Date: 03/06/2020 CLINICAL DATA:  Metastatic renal cell carcinoma. Thyroid cancer. Restaging. EXAM: CT CHEST, ABDOMEN AND PELVIS WITHOUT CONTRAST TECHNIQUE: Multidetector CT imaging of the chest, abdomen and pelvis was performed following the  standard protocol without IV contrast. COMPARISON:  08/18/2019 CT chest, abdomen and pelvis. FINDINGS: CT CHEST FINDINGS Cardiovascular: Normal heart size. No significant pericardial effusion/thickening. Left anterior descending and right coronary atherosclerosis. Atherosclerotic nonaneurysmal thoracic aorta. Normal caliber pulmonary arteries. Mediastinum/Nodes: Total thyroidectomy. Unremarkable esophagus. No pathologically enlarged axillary, mediastinal or hilar lymph nodes, noting limited sensitivity for the detection of hilar adenopathy on this noncontrast study. Lungs/Pleura: No pneumothorax. No right pleural effusion. Small dependent left pleural effusion, new. Sharply marginated bandlike foci of consolidation in the right middle lobe and anterior right lower lobe with associated mild volume loss and distortion, unchanged, compatible with postradiation change. Two stable tiny left lower lobe pulmonary nodules, largest 4 mm (series 6/image 147). No acute consolidative airspace disease, lung masses or new significant pulmonary nodules. Musculoskeletal: Stable ununited pathologic anterior right fourth rib fracture with underlying mixed lytic and sclerotic change. No new focal osseous lesions in the chest. Minimal thoracic spondylosis. New mild anasarca. CT ABDOMEN PELVIS FINDINGS Hepatobiliary: Normal liver size. Stable granulomatous posterior right liver subcentimeter calcification. No liver masses. Cholecystectomy. Stable postsurgical changes from choledochojejunostomy with pneumobilia. Intrahepatic bile ducts are nondilated. 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 Spleen: Surgically absent. Adrenals/Urinary Tract: Normal adrenals. Right nephrectomy with no discrete mass or fluid collection in the right nephrectomy bed. No left hydronephrosis. Nonobstructing 6 mm lower left renal stone. No contour deforming left renal mass. Normal nondistended  bladder. Stomach/Bowel: Postsurgical changes from distal gastrectomy with gastrojejunostomy with no acute gastric abnormality. No unexpected small bowel dilatation or definite small bowel wall thickening. Oral contrast transits to the right colon. Appendix not discretely visualized. Collapsed large bowel with stable mild right colonic wall thickening and no acute large bowel wall thickening or acute pericolonic fat stranding. No diverticulosis. Vascular/Lymphatic: Atherosclerotic nonaneurysmal abdominal aorta. No pathologically enlarged lymph nodes in the abdomen or pelvis. Reproductive: Top-normal size prostate. Other: No pneumoperitoneum. New small volume ascites, predominantly in the pelvis. No focal fluid collection. Mild to moderate anasarca is new.  Musculoskeletal: No aggressive appearing focal osseous lesions. Mild lumbar spondylosis. IMPRESSION: 1. No new or progressive metastatic disease. Tiny left lower lobe pulmonary nodules are stable. Stable postradiation change in the right mid lung. Stable ununited pathologic anterior right fourth rib fracture with underlying mixed lytic and sclerotic change. 2. Evidence of new third-spacing of fluid including small left pleural effusion, mild to moderate anasarca and small volume ascites. Mild right colonic wall thickening is nonspecific and also probably due to noninflammatory edema. 3. Chronic findings include: Coronary atherosclerosis. Nonobstructing left nephrolithiasis. Aortic Atherosclerosis (ICD10-I70.0). Electronically Signed   By: Ilona Sorrel M.D.   On: 03/06/2020 15:28     ASSESSMENT & PLAN:   69 y.o. Caucasian male with  #1 Recurrent metastatic Renal cell carcinoma with multiple lung metastases.  Has been on stable on 1st line Sutent for >10yr. Has had SBRT to the dominant right lung nodule with partial response. No lab or clinical evidence of metastatic RCC progression at this time.  PET/CT 08/08/2016 with a couple of mildly enlarge LLL  pulmonary nodules - no other overt evidence of RCC progression at this time.  PET CT scan 11/06/2016 - no overt evidence of RCC progression at this time .  PET/CT 03/13/2017 - no overt evidence of RCC progression at this time .  PET/CT 08/10/2017 - no overt evidence of RCC progression at this time .  PET/ CT done 08/10/2017, shows no evidence of  Progression at this time.  12/07/17 PET which revealed No findings suspicious for recurrent or metastatic disease.  03/22/18 PET/CT revealed One of the left lower lobe nodules has very minimally enlarged over the last 2 years, currently 0.6 by 0.4 cm and measuring 0.4 by 0.3 cm on 03/20/2016. This probably represents a previously treated metastatic nodule given that it measured 1.0 by 0.8 cm on 06/06/2015. This nodule may be subject to some slice selection phenomenon and also motion artifact given proximity to the diaphragm. Surveillance is likely warranted. 2. Chronic central lucency, marginal bony deposition, and slow healing of the right lateral fourth rib fracture. Possibilities may include underlying radiation necrosis as a cause for fracture leading to slow healing, or a low-grade pathologic lesion. Maximum SUV at this fracture site is 2.9, previously 2.4. 3. 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 There is only low-grade activity in this vicinity characteristic of radiation fibrosis, without focal activity. 4. Other imaging findings of potential clinical significance: Aortic Atherosclerosis. Coronary atherosclerosis. Mild cardiomegaly. Right nephrectomy. 8 mm nonobstructive calculus in the left kidney lower pole.   08/24/18 CT C/A/P revealed "Stable small left lower lobe pulmonary nodules from recent prior studies. As previously noted, these have decreased in size from PET-CT 06/06/2015, likely treated metastases. No new or enlarging pulmonary nodules. 2. Postsurgical changes in the  upper abdomen without evidence of local recurrence or metastatic disease. 3. Nonobstructing left renal calculus. 4. Stable radiation changes in the right upper lobe with probable chronic pathologic fracture of the right 4th rib laterally."  03/07/2019 CT C/A wo contrast revealed "1. Stable left lower lobe pulmonary nodules, likely treated Metastases. 2. No additional evidence of metastatic disease. 3. Left renal stone. 4. Post radiation scarring in the right hemithorax with a healed adjacent right rib fracture which is presumably pathologic in Etiology. 5.  Aortic atherosclerosis (ICD10-170.0). 6. 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 Confirmed by [place].  #4 mild thrombocytopenia likely related to Sutent- resolved with dose reduction. #5 Abnormal Liver function test - significant bump in transaminases. -resolved on labs today #6 grade 1 fatigue due to Sutent -better with dose reduction and changing schedule to 2 week on and 1 week. #7 Dysguesia from Sutent - manageable per patient. He is trying to eat as well as he can and is maintaining his body weight.  #8 RBC macrocytosis due to Sutent #9 chronic kidney disease/single kidney - creatinine stable @ 1.5-1.9  Recommended to avoid NSAIDS and other nephrotoxic medications. Optimize control of DM2 #10 rheumatoid arthritis -follows with Dr. Estanislado Pandy for mx #11 DM2 -continue f/u with Dr Philip Aspen for continue mx, blood sugar at home per patient.    PLAN: -Discussed pt labwork today, 03/15/20; of CBC w/diff and CMP is as follows: all values are WNL except for RBC at 3.09, Hemoglobin at 12, HCT at 35.6, MCV at 115.2, MCH at 38.8, RDW at 16.3, Chloride at 112, CO2 at 19, Glucose at 240, BUN at 32, Creatinine at 2.85, Calcium at 8.1, Total protein at 4.8, Albumin at 2.5, Alkaline Phosphatase at 135, GFR, Est Non Af Am at 21, GFR, Est  AFR Am at 25 -Discussed 03/06/20 of CT Abdomen Pelvis Wo Contrast ([id number]) and CT Chest Wo Contrast ([id number]) -Advised on staying hydrated and watching glucose -Advised not taking Ibuprofen -On lab or clinical evidence of progression of metastatic RCC at this time -Advised on fluid overload  -Advised kidney numbers a worsening -concern due to uncontrolled rheumatoid arthritis, diabetes affecting remaining kidney, medication  -Advised on PCP Dr. Sharlett Iles helping with coordinating care  -Recommends holding Sutent therapy for 6 weeks -Recommends seeing a nephrologist  -Recommends seeing rheumatologist  -Will get labs today -Will get ECHO in 3-4 days -Will see back in 6 weeks  FOLLOW UP: Urgent nephrology referral for worsening renal function and anasarca ECHO in 3-4 days Labs today RTC with Dr Irene Limbo with labs in 6 weeks   The total time spent in the appt was 30 minutes and more than 50% was on counseling and direct patient cares.  All of the patient's questions were answered with apparent satisfaction. The patient knows to call the clinic with any problems, questions or concerns.  Sullivan Lone MD [place] AAHIVMS [facility] Hematology/Oncology Physician [facility]  (Office):       [phone] (Work cell):  [phone] (Fax):           [phone]  I, Dawayne Cirri am acting as a scribe for Dr. Sullivan Lone.   .I have reviewed the above documentation for accuracy and completeness, and I agree with the above. Brunetta Genera MD

## 2020-03-15 ENCOUNTER — Other Ambulatory Visit: Payer: Self-pay

## 2020-03-15 ENCOUNTER — Inpatient Hospital Stay: Payer: Medicare Other | Attending: Hematology

## 2020-03-15 ENCOUNTER — Inpatient Hospital Stay: Payer: Medicare Other | Admitting: Hematology

## 2020-03-15 ENCOUNTER — Inpatient Hospital Stay: Payer: Medicare Other

## 2020-03-15 VITALS — BP 123/75 | HR 54 | Temp 96.6°F | Resp 18 | Ht 70.0 in | Wt 205.1 lb

## 2020-03-15 DIAGNOSIS — C641 Malignant neoplasm of right kidney, except renal pelvis: Secondary | ICD-10-CM | POA: Insufficient documentation

## 2020-03-15 DIAGNOSIS — C649 Malignant neoplasm of unspecified kidney, except renal pelvis: Secondary | ICD-10-CM

## 2020-03-15 DIAGNOSIS — Z7982 Long term (current) use of aspirin: Secondary | ICD-10-CM | POA: Diagnosis not present

## 2020-03-15 DIAGNOSIS — M069 Rheumatoid arthritis, unspecified: Secondary | ICD-10-CM | POA: Diagnosis not present

## 2020-03-15 DIAGNOSIS — N179 Acute kidney failure, unspecified: Secondary | ICD-10-CM | POA: Diagnosis not present

## 2020-03-15 DIAGNOSIS — C7800 Secondary malignant neoplasm of unspecified lung: Secondary | ICD-10-CM | POA: Insufficient documentation

## 2020-03-15 DIAGNOSIS — R601 Generalized edema: Secondary | ICD-10-CM

## 2020-03-15 DIAGNOSIS — Z87891 Personal history of nicotine dependence: Secondary | ICD-10-CM | POA: Insufficient documentation

## 2020-03-15 DIAGNOSIS — N184 Chronic kidney disease, stage 4 (severe): Secondary | ICD-10-CM | POA: Diagnosis not present

## 2020-03-15 DIAGNOSIS — E039 Hypothyroidism, unspecified: Secondary | ICD-10-CM | POA: Insufficient documentation

## 2020-03-15 DIAGNOSIS — C7801 Secondary malignant neoplasm of right lung: Secondary | ICD-10-CM

## 2020-03-15 DIAGNOSIS — Z79899 Other long term (current) drug therapy: Secondary | ICD-10-CM | POA: Insufficient documentation

## 2020-03-15 DIAGNOSIS — I10 Essential (primary) hypertension: Secondary | ICD-10-CM | POA: Diagnosis not present

## 2020-03-15 DIAGNOSIS — C7900 Secondary malignant neoplasm of unspecified kidney and renal pelvis: Secondary | ICD-10-CM

## 2020-03-15 LAB — URINALYSIS, COMPLETE (UACMP) WITH MICROSCOPIC
Bacteria, UA: NONE SEEN
Bilirubin Urine: NEGATIVE
Glucose, UA: NEGATIVE mg/dL
Hgb urine dipstick: NEGATIVE
Ketones, ur: NEGATIVE mg/dL
Leukocytes,Ua: NEGATIVE
Nitrite: NEGATIVE
Protein, ur: 30 mg/dL — AB
Specific Gravity, Urine: 1.009 (ref 1.005–1.030)
pH: 5 (ref 5.0–8.0)

## 2020-03-15 LAB — CMP (CANCER CENTER ONLY)
ALT: 33 U/L (ref 0–44)
AST: 40 U/L (ref 15–41)
Albumin: 2.5 g/dL — ABNORMAL LOW (ref 3.5–5.0)
Alkaline Phosphatase: 135 U/L — ABNORMAL HIGH (ref 38–126)
Anion gap: 6 (ref 5–15)
BUN: 32 mg/dL — ABNORMAL HIGH (ref 8–23)
CO2: 19 mmol/L — ABNORMAL LOW (ref 22–32)
Calcium: 8.1 mg/dL — ABNORMAL LOW (ref 8.9–10.3)
Chloride: 112 mmol/L — ABNORMAL HIGH (ref 98–111)
Creatinine: 2.85 mg/dL — ABNORMAL HIGH (ref 0.61–1.24)
GFR, Est AFR Am: 25 mL/min — ABNORMAL LOW (ref >60)
GFR, Estimated: 21 mL/min — ABNORMAL LOW (ref >60)
Glucose, Bld: 240 mg/dL — ABNORMAL HIGH (ref 70–99)
Potassium: 4.9 mmol/L (ref 3.5–5.1)
Sodium: 137 mmol/L (ref 135–145)
Total Bilirubin: 0.9 mg/dL (ref 0.3–1.2)
Total Protein: 4.8 g/dL — ABNORMAL LOW (ref 6.5–8.1)

## 2020-03-15 LAB — CBC WITH DIFFERENTIAL/PLATELET
Abs Immature Granulocytes: 0.01 10*3/uL (ref 0.00–0.07)
Basophils Absolute: 0 10*3/uL (ref 0.0–0.1)
Basophils Relative: 1 %
Eosinophils Absolute: 0.2 10*3/uL (ref 0.0–0.5)
Eosinophils Relative: 6 %
HCT: 35.6 % — ABNORMAL LOW (ref 39.0–52.0)
Hemoglobin: 12 g/dL — ABNORMAL LOW (ref 13.0–17.0)
Immature Granulocytes: 0 %
Lymphocytes Relative: 32 %
Lymphs Abs: 1.3 10*3/uL (ref 0.7–4.0)
MCH: 38.8 pg — ABNORMAL HIGH (ref 26.0–34.0)
MCHC: 33.7 g/dL (ref 30.0–36.0)
MCV: 115.2 fL — ABNORMAL HIGH (ref 80.0–100.0)
Monocytes Absolute: 0.4 10*3/uL (ref 0.1–1.0)
Monocytes Relative: 10 %
Neutro Abs: 2.2 10*3/uL (ref 1.7–7.7)
Neutrophils Relative %: 51 %
Platelets: 261 10*3/uL (ref 150–400)
RBC: 3.09 MIL/uL — ABNORMAL LOW (ref 4.22–5.81)
RDW: 16.3 % — ABNORMAL HIGH (ref 11.5–15.5)
WBC: 4.2 10*3/uL (ref 4.0–10.5)
nRBC: 0 % (ref 0.0–0.2)

## 2020-03-17 ENCOUNTER — Other Ambulatory Visit: Payer: Self-pay

## 2020-03-17 ENCOUNTER — Encounter (HOSPITAL_COMMUNITY): Payer: Self-pay

## 2020-03-17 ENCOUNTER — Emergency Department (HOSPITAL_COMMUNITY)
Admission: EM | Admit: 2020-03-17 | Discharge: 2020-03-18 | Disposition: A | Discharge status: Home / Self Care | Payer: Medicare Other | Source: Home / Self Care | Attending: Emergency Medicine | Admitting: Emergency Medicine

## 2020-03-17 ENCOUNTER — Emergency Department (HOSPITAL_COMMUNITY): Payer: Medicare Other

## 2020-03-17 DIAGNOSIS — N179 Acute kidney failure, unspecified: Secondary | ICD-10-CM | POA: Diagnosis not present

## 2020-03-17 DIAGNOSIS — R0789 Other chest pain: Secondary | ICD-10-CM

## 2020-03-17 DIAGNOSIS — D539 Nutritional anemia, unspecified: Secondary | ICD-10-CM

## 2020-03-17 DIAGNOSIS — Z79899 Other long term (current) drug therapy: Secondary | ICD-10-CM | POA: Insufficient documentation

## 2020-03-17 DIAGNOSIS — Z85850 Personal history of malignant neoplasm of thyroid: Secondary | ICD-10-CM | POA: Insufficient documentation

## 2020-03-17 DIAGNOSIS — R601 Generalized edema: Secondary | ICD-10-CM | POA: Diagnosis not present

## 2020-03-17 DIAGNOSIS — Z794 Long term (current) use of insulin: Secondary | ICD-10-CM | POA: Insufficient documentation

## 2020-03-17 DIAGNOSIS — Z85528 Personal history of other malignant neoplasm of kidney: Secondary | ICD-10-CM | POA: Insufficient documentation

## 2020-03-17 DIAGNOSIS — I10 Essential (primary) hypertension: Secondary | ICD-10-CM | POA: Insufficient documentation

## 2020-03-17 DIAGNOSIS — Z85828 Personal history of other malignant neoplasm of skin: Secondary | ICD-10-CM | POA: Insufficient documentation

## 2020-03-17 DIAGNOSIS — E119 Type 2 diabetes mellitus without complications: Secondary | ICD-10-CM | POA: Insufficient documentation

## 2020-03-17 DIAGNOSIS — Z9221 Personal history of antineoplastic chemotherapy: Secondary | ICD-10-CM | POA: Insufficient documentation

## 2020-03-17 DIAGNOSIS — Z87891 Personal history of nicotine dependence: Secondary | ICD-10-CM | POA: Insufficient documentation

## 2020-03-17 DIAGNOSIS — M79659 Pain in unspecified thigh: Secondary | ICD-10-CM | POA: Insufficient documentation

## 2020-03-17 DIAGNOSIS — N289 Disorder of kidney and ureter, unspecified: Secondary | ICD-10-CM

## 2020-03-17 DIAGNOSIS — R109 Unspecified abdominal pain: Secondary | ICD-10-CM | POA: Insufficient documentation

## 2020-03-17 DIAGNOSIS — Z79890 Hormone replacement therapy (postmenopausal): Secondary | ICD-10-CM | POA: Insufficient documentation

## 2020-03-17 DIAGNOSIS — E039 Hypothyroidism, unspecified: Secondary | ICD-10-CM | POA: Insufficient documentation

## 2020-03-17 DIAGNOSIS — R778 Other specified abnormalities of plasma proteins: Secondary | ICD-10-CM

## 2020-03-17 DIAGNOSIS — Z7982 Long term (current) use of aspirin: Secondary | ICD-10-CM | POA: Insufficient documentation

## 2020-03-17 LAB — BASIC METABOLIC PANEL
Anion gap: 6 (ref 5–15)
BUN: 43 mg/dL — ABNORMAL HIGH (ref 8–23)
CO2: 17 mmol/L — ABNORMAL LOW (ref 22–32)
Calcium: 7.8 mg/dL — ABNORMAL LOW (ref 8.9–10.3)
Chloride: 113 mmol/L — ABNORMAL HIGH (ref 98–111)
Creatinine, Ser: 3.18 mg/dL — ABNORMAL HIGH (ref 0.61–1.24)
GFR calc Af Amer: 22 mL/min — ABNORMAL LOW (ref >60)
GFR calc non Af Amer: 19 mL/min — ABNORMAL LOW (ref >60)
Glucose, Bld: 94 mg/dL (ref 70–99)
Potassium: 5 mmol/L (ref 3.5–5.1)
Sodium: 136 mmol/L (ref 135–145)

## 2020-03-17 LAB — CBC
HCT: 34.4 % — ABNORMAL LOW (ref 39.0–52.0)
Hemoglobin: 11.6 g/dL — ABNORMAL LOW (ref 13.0–17.0)
MCH: 39.6 pg — ABNORMAL HIGH (ref 26.0–34.0)
MCHC: 33.7 g/dL (ref 30.0–36.0)
MCV: 117.4 fL — ABNORMAL HIGH (ref 80.0–100.0)
Platelets: 226 10*3/uL (ref 150–400)
RBC: 2.93 MIL/uL — ABNORMAL LOW (ref 4.22–5.81)
RDW: 16.4 % — ABNORMAL HIGH (ref 11.5–15.5)
WBC: 1.6 10*3/uL — ABNORMAL LOW (ref 4.0–10.5)
nRBC: 0 % (ref 0.0–0.2)

## 2020-03-17 LAB — TROPONIN I (HIGH SENSITIVITY): Troponin I (High Sensitivity): 21 ng/L — ABNORMAL HIGH (ref <18)

## 2020-03-17 NOTE — ED Provider Notes (Signed)
[name] Provider Note   CSN: [id number] Arrival date & time: 03/17/20  2058     History Chief Complaint  Patient presents with  . Chest Pain  . Weakness    Dylan Jefferson is a 69 y.o. male.  HPI He presents for evaluation of chest, abdomen and thigh pain which started last night and has been persistent throughout the day, never going away.  He states it feels like "fluid."  He is being managed by his oncologist, and PCP for weight gain with furosemide diuresis.  His kidney function has been worsening during this time.  He has been referred to nephrology, and scheduled for a cardiac echo; neither done yet.  He denies associated diaphoresis, cough, shortness of breath, fever or chills.  He has ongoing nausea and food intolerance because of vomiting and diarrhea which are both sporadic.  He is being followed closely by oncology and had a CT of the abdomen pelvis done about 10 days ago.  He has history of renal carcinoma, metastatic.  He is currently on drug holiday from his oral chemotherapeutic agent.  No prior cardiac history.  No history of COPD or other lung problems besides metastatic lung cancer.  There are no other known modifying factors.    Past Medical History:  Diagnosis Date  . Arthritis   . Cancer [facility])    Renal cell  . DDD (degenerative disc disease), cervical 05/28/2016  . DDD (degenerative disc disease), lumbar 05/28/2016  . Depression   . Diabetes ([place])   . Hyperlipidemia 05/28/2016  . Hypertension   . Hypothyroidism   . 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 Kidney disease   . Nephrolithiasis   . Osteoarthritis of both hands 05/28/2016  . Osteoarthritis of both knees 05/28/2016  . Peripheral vascular disorder ([place]) 05/28/2016  . 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 Rheumatoid arthritis ([place])   . Thyroid cancer ([place]) 05/28/2016    Patient Active Problem List    Diagnosis Date Noted  . Counseling regarding advanced care planning and goals of care 06/11/2017  . Contracture, right elbow 12/12/2016  . 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 History of diabetes mellitus 07/24/2016  . History of esophagitis 07/24/2016  . Thyroid cancer ([place]) 05/28/2016  . Hyperlipidemia 05/28/2016  . 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 DDD (degenerative disc disease), cervical 05/28/2016  . DDD (degenerative disc disease), lumbar 05/28/2016  . Osteoarthritis of both knees 05/28/2016  . Osteoarthritis of both hands 05/28/2016  . Peripheral vascular disorder ([place]) 05/28/2016  . 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 Nodule of finger of left hand 10/11/2015  . Lung metastases ([place]) 09/06/2015  . Fatigue 08/16/2015  . Hyperbilirubinemia 08/16/2015  . Renal insufficiency 08/09/2015  . Encounter for chemotherapy management 07/16/2015  . 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 ([place]) 07/12/2015  . Personal history of renal cell cancer 03/29/2014  . Special screening for malignant neoplasms, colon 03/29/2014  . Esophageal varices with bleeding ([place]) 03/29/2014  . RENAL CELL CANCER, METASTATIC 12/04/2007  . Hypothyroidism 12/04/2007  . Diabetes mellitus ([place]) 12/04/2007  . HTN (hypertension) 12/04/2007  . ESOPHAGITIS 12/04/2007  . HEMOCCULT POSITIVE STOOL 12/04/2007  . HEADACHE, CHRONIC 12/04/2007    Past Surgical History:  Procedure Laterality Date  . CATARACT EXTRACTION Bilateral 2013  . CHOLECYSTECTOMY    . GASTRECTOMY     tumor removed  . LIPOMA EXCISION Right 1999  . NEPHRECTOMY Right   . PANCREATECTOMY    .  SPLENECTOMY    . THYROIDECTOMY    . URETERAL STENT PLACEMENT Left 2012  . WHIPPLE PROCEDURE         Family History  Problem Relation Age of Onset  . Bleeding Disorder Mother        Unknown what type  . Osteoporosis Mother   . Lymphoma Father        Hodgkin's lymphoma  . Colon cancer Neg Hx     Social History   Tobacco Use    . Smoking status: Former Smoker    Years: 1.50  . Smokeless tobacco: Never Used  Vaping Use  . Vaping Use: Never used  Substance Use Topics  . Alcohol use: Yes    Comment: occasional beer  . Drug use: No    Home Medications Prior to Admission medications   Medication Sig Start Date End Date Taking? Authorizing Provider  amLODipine (NORVASC) 10 MG tablet take 1 tablet by mouth once daily Patient not taking: Reported on 03/31/2018 04/07/17   Brunetta Genera, MD  amLODipine (NORVASC) 5 MG tablet Take 10 mg by mouth daily. 12/24/19   [provider]  Ascorbic Acid (VITAMIN C) 1000 MG tablet Take 1,000 mg by mouth daily.    [provider]  aspirin 81 MG tablet Take 81 mg by mouth daily. Patient not taking: Reported on 03/15/2020    [provider]  atenolol (TENORMIN) 100 MG tablet  01/16/14   [provider]  augmented betamethasone dipropionate (DIPROLENE-AF) 0.05 % cream  03/05/15   [provider]  calcium citrate-vitamin D (CITRACAL+D) 315-200 MG-UNIT per tablet Take 2 tablets by mouth daily.     [provider]  Cyanocobalamin (VITAMIN B-12) 2500 MCG SUBL Take by mouth daily.     [provider]  diclofenac Sodium (VOLTAREN) 1 % GEL SMARTSIG:Gram(s) Topical 3 Times Daily PRN 02/09/20   [provider]  docusate sodium (COLACE) 100 MG capsule Take 100 mg by mouth 2 (two) times daily.    [provider]  Dorzolamide HCl-Timolol Mal PF 2-0.5 % SOLN Apply 1 drop to eye 2 (two) times daily. 11/16/19   [provider]  dorzolamide-timolol (COSOPT) 22.3-6.8 MG/ML ophthalmic solution place 1 drop into affected eye twice a day Patient not taking: Reported on 03/15/2020 11/08/15   [provider]  famotidine (PEPCID) 20 MG tablet Take 20 mg by mouth 2 (two) times daily.    [provider]  ferrous sulfate 325 (65 FE) MG tablet Take 325 mg by mouth 2 (two) times daily with a meal.     [provider]  furosemide (LASIX) 40 MG tablet Take 40 mg by mouth every morning. 02/28/20   [provider]  gabapentin (NEURONTIN) 300 MG capsule Take 300 mg by mouth 2 (two) times daily. 02/13/20   [provider]  Glucosamine-MSM-Hyaluronic Acd (JOINT HEALTH) 750-375-30 MG TABS Take 2 tablets by mouth daily.    [provider]  insulin lispro (HUMALOG) 100 UNIT/ML injection Inject into the skin 3 (three) times daily before meals.    [provider]  Iron-Vitamins (GERITOL COMPLETE) TABS Take 1 tablet by mouth daily.    [provider]  Lactobacillus (ACIDOPHILUS) CAPS capsule Take 1 capsule by mouth daily.    [provider]  latanoprost (XALATAN) 0.005 % ophthalmic solution place 1 drop into both eyes every evening 11/08/15   [provider]  LEVEMIR 100 UNIT/ML injection Inject 7 Units into the skin 2 (two) times  daily.  03/05/15   [provider]  levothyroxine (SYNTHROID, LEVOTHROID) 300 MCG tablet Take 300 mcg by mouth 3 (three) times a week.  01/19/14   [provider]  lovastatin (MEVACOR) 40 MG tablet Take 40 mg by mouth at bedtime.  01/18/14   [provider]  minoxidil (LONITEN) 10 MG tablet Take 10 mg by mouth 2 (two) times daily.  02/22/14   [provider]  Multiple Vitamin (MULTIVITAMIN) tablet Take 1 tablet by mouth daily.    [provider]  Omega-3 Fatty Acids (FISH OIL) 1200 MG CAPS Take 1 capsule by mouth daily.    [provider]  Pancrelipase, Lip-Prot-Amyl, (CREON) 6000 UNITS CPEP Take 4 capsules by mouth 3 (three) times daily.    [provider]  potassium chloride SA (KLOR-CON) 20 MEQ tablet Take 20 mEq by mouth daily. 02/28/20   [provider]  potassium citrate (UROCIT-K) 10 MEQ (1080 MG) SR tablet Take 10 mEq by mouth 2 (two) times daily.  Patient not taking: Reported on 03/15/2020 01/24/14   [provider]  sulindac (CLINORIL) 150 MG  tablet Take 150 mg by mouth at bedtime. 03/12/20   [provider]  SUTENT 37.5 MG capsule TAKE 1 CAPSULE (37.5 MG) BY MOUTH ONCE DAILY FOR 2 WEEKS ON, 1 WEEK OFF 03/08/20   Brunetta Genera, MD  valsartan-hydrochlorothiazide (DIOVAN-HCT) 160-12.5 MG per tablet  01/18/14   [provider]  VOLTAREN 1 % GEL apply 3 grams to LARGE JOINTS three times a day if needed 12/12/15   [provider]    Allergies    Patient has no known allergies.  Review of Systems   Review of Systems  All other systems reviewed and are negative.   Physical Exam Updated Vital Signs BP 108/63   Pulse 62   Temp 98.3 F (36.8 C) (Oral)   Resp 13   SpO2 98%   Physical Exam Vitals and nursing note reviewed.  Constitutional:      General: He is not in acute distress.    Appearance: He is well-developed. He is not ill-appearing, toxic-appearing or diaphoretic.  HENT:     Head: Normocephalic and atraumatic.     Right Ear: External ear normal.     Left Ear: External ear normal.     Mouth/Throat:     Mouth: Mucous membranes are moist.     Pharynx: No oropharyngeal exudate or posterior oropharyngeal erythema.  Eyes:     Conjunctiva/sclera: Conjunctivae normal.     Pupils: Pupils are equal, round, and reactive to light.  Neck:     Trachea: Phonation normal.  Cardiovascular:     Rate and Rhythm: Normal rate and regular rhythm.     Heart sounds: Normal heart sounds.  Pulmonary:     Effort: Pulmonary effort is normal. No respiratory distress.     Breath sounds: Normal breath sounds. No stridor. No wheezing or rhonchi.  Abdominal:     General: There is no distension.     Palpations: Abdomen is soft.     Tenderness: There is no abdominal tenderness. There is no guarding.  Musculoskeletal:        General: Normal range of motion.     Cervical back: Normal range of motion and neck supple.     Right lower leg: Edema present.     Left lower leg: Edema present.     Comments: 3+ pitting  edema bilateral legs.  Skin:    General: Skin is  warm and dry.     Comments: Mild anasarca noted  Neurological:     Mental Status: He is alert and oriented to person, place, and time.     Cranial Nerves: No cranial nerve deficit.     Sensory: No sensory deficit.     Motor: No abnormal muscle tone.     Coordination: Coordination normal.  Psychiatric:        Mood and Affect: Mood normal.        Behavior: Behavior normal.        Thought Content: Thought content normal.        Judgment: Judgment normal.     ED Results / Procedures / Treatments   Labs (all labs ordered are listed, but only abnormal results are displayed) Labs Reviewed  BASIC METABOLIC PANEL - Abnormal; Notable for the following components:      Result Value   Chloride 113 (*)    CO2 17 (*)    BUN 43 (*)    Creatinine, Ser 3.18 (*)    Calcium 7.8 (*)    GFR calc non Af Amer 19 (*)    GFR calc Af Amer 22 (*)    All other components within normal limits  CBC - Abnormal; Notable for the following components:   WBC 1.6 (*)    RBC 2.93 (*)    Hemoglobin 11.6 (*)    HCT 34.4 (*)    MCV 117.4 (*)    MCH 39.6 (*)    RDW 16.4 (*)    All other components within normal limits  TROPONIN I (HIGH SENSITIVITY) - Abnormal; Notable for the following components:   Troponin I (High Sensitivity) 21 (*)    All other components within normal limits  TROPONIN I (HIGH SENSITIVITY)    EKG EKG Interpretation  Date/Time:  Saturday March 17 2020 21:26:26 EDT Ventricular Rate:  68 PR Interval:    QRS Duration: 111 QT Interval:  404 QTC Calculation: 430 R Axis:   -65 Text Interpretation: Sinus rhythm Atrial premature complex Left anterior fascicular block Abnormal R-wave progression, late transition Borderline T abnormalities, lateral leads Baseline wander in lead(s) V2 12 Lead; [place] No significant change since last tracing Confirmed by Lacretia Leigh ([id number]) on 03/17/2020 9:50:25 PM   Radiology DG Chest 2  View  Result Date: 03/17/2020 CLINICAL DATA:  Chest pain EXAM: CHEST - 2 VIEW COMPARISON:  06/13/2015, PET-CT 03/22/2018, CT chest 03/06/2020 FINDINGS: Small left pleural effusion with mild basilar atelectasis. Scarring and post treatment change in the right mid lung as demonstrated on prior CT. Borderline cardiomegaly. No pneumothorax. IMPRESSION: Small left pleural effusion with mild basilar atelectasis. Stable scarring and post treatment changes in the right mid lung. Electronically Signed   By: Donavan Foil M.D.   On: 03/17/2020 21:49    Procedures Procedures (including critical care time)  Medications Ordered in ED Medications - No data to display  ED Course  I have reviewed the triage vital signs and the nursing notes.  Pertinent labs & imaging results that were available during my care of the patient were reviewed by me and considered in my medical decision making (see chart for details).  Clinical Course as of Mar 17 2348  Sat Mar 17, 2020  2334 Mild elevation  Troponin I (High Sensitivity)(!) [EW]  2334 Normal except white count low, hemoglobin low, MCV high, MCH high  CBC(!) [EW]  2335 Normal except chloride high, CO2 low, BUN high, creatinine high, calcium low, GFR low  Basic metabolic panel(!) [EW]  7017 ED EKG [EW]  2335 Per radiologist, small left pleural effusion otherwise no acute abnormality  DG Chest 2 View [EW]    Clinical Course User Index [EW] Daleen Bo, MD   MDM Rules/Calculators/A&P                           Patient Vitals for the past 24 hrs:  BP Temp Temp src Pulse Resp SpO2  03/17/20 2230 108/63 -- -- 62 13 98 %  03/17/20 2119 101/72 98.3 F (36.8 C) Oral 70 20 93 %    11:49 PM Reevaluation with update and discussion. After initial assessment and treatment, an updated evaluation reveals he remains comfortable has no further complaints, findings discussed with patient and wife. Daleen Bo   Medical Decision Making:  This patient is  presenting for evaluation of torso and extremity discomfort, which does require a range of treatment options, and is a complaint that involves a high risk of morbidity and mortality. The differential diagnoses include fluid overload, ACS, pneumonia, metabolic instability. I decided to review old records, and in summary elderly male, being treated for metastatic renal carcinoma, with fluid retention, possibly related to his oral chemotherapeutic agent.  Work-up in process as an outpatient, but he was sent here today because of "chest discomfort.".  I obtained additional historical information from his wife at the bedside.  Clinical Laboratory Tests Ordered, included CBC, Metabolic panel and Troponins. Review indicates initial troponin mildly elevated creatinine elevated, near baseline, remainder reassuring/normal. Radiologic Tests Ordered, included chest x-ray.  I independently Visualized: Radiographic images, which show small left pleural effusion  Cardiac Monitor Tracing which shows sinus rhythm    Critical Interventions-clinical evaluation, laboratory testing, chest x-ray, delta troponin, observation reassessment  After These Interventions, the Patient was reevaluated and was found with mild fluid overload, likely causing discomfort.  Mild renal insufficiency, near baseline, currently being followed closely with planned outpatient follow-up with nephrology, and cardiac echo for assessment of cardiac function.  Minimal troponin elevation, follow-up with delta troponin; ordered  CRITICAL CARE-no Performed by: Daleen Bo  Nursing Notes Reviewed/ Care Coordinated Applicable Imaging Reviewed Interpretation of Laboratory Data incorporated into ED treatment  Disposition per Dr. Roxanne Mins following return of second troponin.  Final Clinical Impression(s) / ED Diagnoses Final diagnoses:  Anasarca    Rx / DC Orders ED Discharge Orders    None       Daleen Bo, MD 03/17/20 2351

## 2020-03-17 NOTE — ED Notes (Signed)
Pt transported to Xray. 

## 2020-03-17 NOTE — ED Triage Notes (Signed)
Pt reports chest pain, nausea, vomiting, and weakness x2 days. Pt has hx of renal cell carcinoma. Pt recently stopped oral chemo treatment. Pt is extremely pale and appears fatigued at this time.

## 2020-03-18 LAB — TROPONIN I (HIGH SENSITIVITY): Troponin I (High Sensitivity): 20 ng/L — ABNORMAL HIGH (ref <18)

## 2020-03-18 NOTE — Discharge Instructions (Addendum)
Your evaluation today did not show any sign of any heart injury. Please follow up with your oncologist and your primary care provider. Return if symptoms are getting worse.

## 2020-03-18 NOTE — ED Provider Notes (Signed)
Care assumed from Dr. Eulis Foster, patient with somewhat atypical chest pain and mildly elevated troponin in setting of renal insufficiency.  Case was signed out pending repeat troponin.  Repeat troponin is unchanged.  He is felt to be safe for discharge.  He will need to follow-up with both his oncologist and his primary care provider.   Delora Fuel, MD 02/01/15 [phone]

## 2020-03-19 ENCOUNTER — Other Ambulatory Visit: Payer: Self-pay

## 2020-03-19 ENCOUNTER — Emergency Department (HOSPITAL_COMMUNITY): Payer: Medicare Other

## 2020-03-19 ENCOUNTER — Encounter (HOSPITAL_COMMUNITY): Payer: Self-pay | Admitting: Internal Medicine

## 2020-03-19 ENCOUNTER — Inpatient Hospital Stay (HOSPITAL_COMMUNITY)
Admission: EM | Admit: 2020-03-19 | Discharge: 2020-03-23 | DRG: 683 | Disposition: A | Discharge status: Home / Self Care | Payer: Medicare Other | Attending: Internal Medicine | Admitting: Internal Medicine

## 2020-03-19 ENCOUNTER — Telehealth: Payer: Self-pay | Admitting: *Deleted

## 2020-03-19 ENCOUNTER — Other Ambulatory Visit: Payer: Self-pay | Admitting: *Deleted

## 2020-03-19 ENCOUNTER — Observation Stay (HOSPITAL_COMMUNITY): Payer: Medicare Other

## 2020-03-19 DIAGNOSIS — E1122 Type 2 diabetes mellitus with diabetic chronic kidney disease: Secondary | ICD-10-CM | POA: Diagnosis present

## 2020-03-19 DIAGNOSIS — Z905 Acquired absence of kidney: Secondary | ICD-10-CM

## 2020-03-19 DIAGNOSIS — Z7982 Long term (current) use of aspirin: Secondary | ICD-10-CM

## 2020-03-19 DIAGNOSIS — E875 Hyperkalemia: Secondary | ICD-10-CM | POA: Diagnosis present

## 2020-03-19 DIAGNOSIS — E039 Hypothyroidism, unspecified: Secondary | ICD-10-CM | POA: Diagnosis present

## 2020-03-19 DIAGNOSIS — I509 Heart failure, unspecified: Secondary | ICD-10-CM | POA: Clinically undetermined

## 2020-03-19 DIAGNOSIS — R601 Generalized edema: Secondary | ICD-10-CM

## 2020-03-19 DIAGNOSIS — Z85528 Personal history of other malignant neoplasm of kidney: Secondary | ICD-10-CM

## 2020-03-19 DIAGNOSIS — N521 Erectile dysfunction due to diseases classified elsewhere: Secondary | ICD-10-CM | POA: Diagnosis present

## 2020-03-19 DIAGNOSIS — Z79890 Hormone replacement therapy (postmenopausal): Secondary | ICD-10-CM

## 2020-03-19 DIAGNOSIS — N1832 Chronic kidney disease, stage 3b: Secondary | ICD-10-CM | POA: Diagnosis present

## 2020-03-19 DIAGNOSIS — N289 Disorder of kidney and ureter, unspecified: Secondary | ICD-10-CM

## 2020-03-19 DIAGNOSIS — Z807 Family history of other malignant neoplasms of lymphoid, hematopoietic and related tissues: Secondary | ICD-10-CM

## 2020-03-19 DIAGNOSIS — E119 Type 2 diabetes mellitus without complications: Secondary | ICD-10-CM

## 2020-03-19 DIAGNOSIS — Z20822 Contact with and (suspected) exposure to covid-19: Secondary | ICD-10-CM | POA: Diagnosis present

## 2020-03-19 DIAGNOSIS — Z9049 Acquired absence of other specified parts of digestive tract: Secondary | ICD-10-CM

## 2020-03-19 DIAGNOSIS — I10 Essential (primary) hypertension: Secondary | ICD-10-CM | POA: Diagnosis not present

## 2020-03-19 DIAGNOSIS — N184 Chronic kidney disease, stage 4 (severe): Secondary | ICD-10-CM

## 2020-03-19 DIAGNOSIS — N179 Acute kidney failure, unspecified: Secondary | ICD-10-CM

## 2020-03-19 DIAGNOSIS — Z9841 Cataract extraction status, right eye: Secondary | ICD-10-CM

## 2020-03-19 DIAGNOSIS — C649 Malignant neoplasm of unspecified kidney, except renal pelvis: Secondary | ICD-10-CM

## 2020-03-19 DIAGNOSIS — E11649 Type 2 diabetes mellitus with hypoglycemia without coma: Secondary | ICD-10-CM | POA: Diagnosis present

## 2020-03-19 DIAGNOSIS — I491 Atrial premature depolarization: Secondary | ICD-10-CM | POA: Diagnosis present

## 2020-03-19 DIAGNOSIS — E876 Hypokalemia: Secondary | ICD-10-CM | POA: Diagnosis present

## 2020-03-19 DIAGNOSIS — N200 Calculus of kidney: Secondary | ICD-10-CM | POA: Diagnosis present

## 2020-03-19 DIAGNOSIS — I130 Hypertensive heart and chronic kidney disease with heart failure and stage 1 through stage 4 chronic kidney disease, or unspecified chronic kidney disease: Secondary | ICD-10-CM | POA: Diagnosis present

## 2020-03-19 DIAGNOSIS — C799 Secondary malignant neoplasm of unspecified site: Secondary | ICD-10-CM | POA: Diagnosis present

## 2020-03-19 DIAGNOSIS — D631 Anemia in chronic kidney disease: Secondary | ICD-10-CM | POA: Diagnosis present

## 2020-03-19 DIAGNOSIS — I959 Hypotension, unspecified: Secondary | ICD-10-CM | POA: Diagnosis present

## 2020-03-19 DIAGNOSIS — R0789 Other chest pain: Secondary | ICD-10-CM

## 2020-03-19 DIAGNOSIS — Z87891 Personal history of nicotine dependence: Secondary | ICD-10-CM

## 2020-03-19 DIAGNOSIS — D539 Nutritional anemia, unspecified: Secondary | ICD-10-CM | POA: Diagnosis present

## 2020-03-19 DIAGNOSIS — D72819 Decreased white blood cell count, unspecified: Secondary | ICD-10-CM | POA: Diagnosis present

## 2020-03-19 DIAGNOSIS — E890 Postprocedural hypothyroidism: Secondary | ICD-10-CM | POA: Diagnosis present

## 2020-03-19 DIAGNOSIS — C7900 Secondary malignant neoplasm of unspecified kidney and renal pelvis: Secondary | ICD-10-CM | POA: Diagnosis present

## 2020-03-19 DIAGNOSIS — Z79899 Other long term (current) drug therapy: Secondary | ICD-10-CM

## 2020-03-19 DIAGNOSIS — I8501 Esophageal varices with bleeding: Secondary | ICD-10-CM | POA: Diagnosis present

## 2020-03-19 DIAGNOSIS — Z923 Personal history of irradiation: Secondary | ICD-10-CM

## 2020-03-19 DIAGNOSIS — Z9842 Cataract extraction status, left eye: Secondary | ICD-10-CM

## 2020-03-19 DIAGNOSIS — M064 Inflammatory polyarthropathy: Secondary | ICD-10-CM | POA: Diagnosis present

## 2020-03-19 DIAGNOSIS — Z791 Long term (current) use of non-steroidal anti-inflammatories (NSAID): Secondary | ICD-10-CM

## 2020-03-19 DIAGNOSIS — I7090 Unspecified atherosclerosis: Secondary | ICD-10-CM | POA: Diagnosis present

## 2020-03-19 DIAGNOSIS — E8809 Other disorders of plasma-protein metabolism, not elsewhere classified: Secondary | ICD-10-CM | POA: Diagnosis present

## 2020-03-19 DIAGNOSIS — E785 Hyperlipidemia, unspecified: Secondary | ICD-10-CM | POA: Diagnosis present

## 2020-03-19 DIAGNOSIS — Z9221 Personal history of antineoplastic chemotherapy: Secondary | ICD-10-CM

## 2020-03-19 DIAGNOSIS — E872 Acidosis: Secondary | ICD-10-CM | POA: Diagnosis present

## 2020-03-19 DIAGNOSIS — R0602 Shortness of breath: Secondary | ICD-10-CM

## 2020-03-19 DIAGNOSIS — R197 Diarrhea, unspecified: Secondary | ICD-10-CM | POA: Diagnosis present

## 2020-03-19 DIAGNOSIS — Z794 Long term (current) use of insulin: Secondary | ICD-10-CM

## 2020-03-19 DIAGNOSIS — Z85850 Personal history of malignant neoplasm of thyroid: Secondary | ICD-10-CM

## 2020-03-19 LAB — CREATININE, SERUM
Creatinine, Ser: 2.76 mg/dL — ABNORMAL HIGH (ref 0.61–1.24)
GFR calc Af Amer: 26 mL/min — ABNORMAL LOW (ref >60)
GFR calc non Af Amer: 22 mL/min — ABNORMAL LOW (ref >60)

## 2020-03-19 LAB — CBG MONITORING, ED
Glucose-Capillary: 63 mg/dL — ABNORMAL LOW (ref 70–99)
Glucose-Capillary: 60 mg/dL — ABNORMAL LOW (ref 70–99)
Glucose-Capillary: 107 mg/dL — ABNORMAL HIGH (ref 70–99)
Glucose-Capillary: 132 mg/dL — ABNORMAL HIGH (ref 70–99)

## 2020-03-19 LAB — CBC WITH DIFFERENTIAL/PLATELET
Abs Immature Granulocytes: 0.01 10*3/uL (ref 0.00–0.07)
Basophils Absolute: 0 10*3/uL (ref 0.0–0.1)
Basophils Relative: 1 %
Eosinophils Absolute: 0.1 10*3/uL (ref 0.0–0.5)
Eosinophils Relative: 8 %
HCT: 34.9 % — ABNORMAL LOW (ref 39.0–52.0)
Hemoglobin: 11.6 g/dL — ABNORMAL LOW (ref 13.0–17.0)
Immature Granulocytes: 1 %
Lymphocytes Relative: 37 %
Lymphs Abs: 0.6 10*3/uL — ABNORMAL LOW (ref 0.7–4.0)
MCH: 38.8 pg — ABNORMAL HIGH (ref 26.0–34.0)
MCHC: 33.2 g/dL (ref 30.0–36.0)
MCV: 116.7 fL — ABNORMAL HIGH (ref 80.0–100.0)
Monocytes Absolute: 0.4 10*3/uL (ref 0.1–1.0)
Monocytes Relative: 25 %
Neutro Abs: 0.5 10*3/uL — ABNORMAL LOW (ref 1.7–7.7)
Neutrophils Relative %: 28 %
Platelets: 250 10*3/uL (ref 150–400)
RBC: 2.99 MIL/uL — ABNORMAL LOW (ref 4.22–5.81)
RDW: 15.8 % — ABNORMAL HIGH (ref 11.5–15.5)
WBC: 1.7 10*3/uL — ABNORMAL LOW (ref 4.0–10.5)
nRBC: 0 % (ref 0.0–0.2)

## 2020-03-19 LAB — SARS CORONAVIRUS 2 BY RT PCR (HOSPITAL ORDER, PERFORMED IN ~~LOC~~ HOSPITAL LAB): SARS Coronavirus 2: NEGATIVE

## 2020-03-19 LAB — PROTEIN / CREATININE RATIO, URINE
Creatinine, Urine: 89.89 mg/dL
Protein Creatinine Ratio: 0.53 mg/mg{Cre} — ABNORMAL HIGH (ref 0.00–0.15)
Total Protein, Urine: 48 mg/dL

## 2020-03-19 LAB — COMPREHENSIVE METABOLIC PANEL
ALT: 23 U/L (ref 0–44)
AST: 38 U/L (ref 15–41)
Albumin: 2.4 g/dL — ABNORMAL LOW (ref 3.5–5.0)
Alkaline Phosphatase: 65 U/L (ref 38–126)
Anion gap: 7 (ref 5–15)
BUN: 45 mg/dL — ABNORMAL HIGH (ref 8–23)
CO2: 20 mmol/L — ABNORMAL LOW (ref 22–32)
Calcium: 7.9 mg/dL — ABNORMAL LOW (ref 8.9–10.3)
Chloride: 111 mmol/L (ref 98–111)
Creatinine, Ser: 2.82 mg/dL — ABNORMAL HIGH (ref 0.61–1.24)
GFR calc Af Amer: 25 mL/min — ABNORMAL LOW (ref >60)
GFR calc non Af Amer: 22 mL/min — ABNORMAL LOW (ref >60)
Glucose, Bld: 105 mg/dL — ABNORMAL HIGH (ref 70–99)
Potassium: 5 mmol/L (ref 3.5–5.1)
Sodium: 138 mmol/L (ref 135–145)
Total Bilirubin: 1.2 mg/dL (ref 0.3–1.2)
Total Protein: 4.8 g/dL — ABNORMAL LOW (ref 6.5–8.1)

## 2020-03-19 LAB — NA AND K (SODIUM & POTASSIUM), RAND UR
Potassium Urine: 21 mmol/L
Sodium, Ur: 57 mmol/L

## 2020-03-19 LAB — BRAIN NATRIURETIC PEPTIDE: B Natriuretic Peptide: 394.4 pg/mL — ABNORMAL HIGH (ref 0.0–100.0)

## 2020-03-19 LAB — TROPONIN I (HIGH SENSITIVITY): Troponin I (High Sensitivity): 19 ng/L — ABNORMAL HIGH (ref <18)

## 2020-03-19 MED ORDER — TRAMADOL HCL 50 MG PO TABS
50.0000 mg | ORAL_TABLET | Freq: Two times a day (BID) | ORAL | Status: DC | PRN
  Administered 2020-03-19 – 2020-03-22 (×4): 50 mg via ORAL
  Filled 2020-03-19 (×4): qty 1

## 2020-03-19 MED ORDER — CALCIUM CARBONATE-VITAMIN D 500-200 MG-UNIT PO TABS
1.0000 | ORAL_TABLET | Freq: Two times a day (BID) | ORAL | Status: DC
  Administered 2020-03-20 – 2020-03-23 (×7): 1 via ORAL
  Filled 2020-03-19 (×7): qty 1

## 2020-03-19 MED ORDER — CALCIUM CITRATE-VITAMIN D 315-200 MG-UNIT PO TABS: 2.0000 | ORAL_TABLET | Freq: Two times a day (BID) | ORAL | Status: DC

## 2020-03-19 MED ORDER — JOINT HEALTH 750-375-30 MG PO TABS: 2.0000 | ORAL_TABLET | Freq: Every day | ORAL | Status: DC

## 2020-03-19 MED ORDER — PANCRELIPASE (LIP-PROT-AMYL) 12000-38000 UNITS PO CPEP
12000.0000 [IU] | ORAL_CAPSULE | Freq: Three times a day (TID) | ORAL | Status: DC
  Administered 2020-03-19 – 2020-03-23 (×11): 12000 [IU] via ORAL
  Filled 2020-03-19 (×15): qty 1

## 2020-03-19 MED ORDER — DICLOFENAC SODIUM 1 % EX GEL
2.0000 g | Freq: Three times a day (TID) | CUTANEOUS | Status: DC | PRN
  Administered 2020-03-23: 2 g via TOPICAL
  Filled 2020-03-19: qty 100

## 2020-03-19 MED ORDER — RISAQUAD PO CAPS
1.0000 | ORAL_CAPSULE | Freq: Every day | ORAL | Status: DC
  Administered 2020-03-20 – 2020-03-23 (×4): 1 via ORAL
  Filled 2020-03-19 (×4): qty 1

## 2020-03-19 MED ORDER — HEPARIN SODIUM (PORCINE) 5000 UNIT/ML IJ SOLN
5000.0000 [IU] | Freq: Three times a day (TID) | INTRAMUSCULAR | Status: DC
  Administered 2020-03-19 – 2020-03-23 (×12): 5000 [IU] via SUBCUTANEOUS
  Filled 2020-03-19 (×12): qty 1

## 2020-03-19 MED ORDER — ASPIRIN EC 81 MG PO TBEC
81.0000 mg | DELAYED_RELEASE_TABLET | Freq: Every day | ORAL | Status: DC
  Administered 2020-03-20 – 2020-03-23 (×4): 81 mg via ORAL
  Filled 2020-03-19 (×4): qty 1

## 2020-03-19 MED ORDER — AMLODIPINE BESYLATE 5 MG PO TABS
10.0000 mg | ORAL_TABLET | Freq: Every day | ORAL | Status: DC
  Administered 2020-03-20: 10 mg via ORAL
  Filled 2020-03-19: qty 2

## 2020-03-19 MED ORDER — ALBUTEROL SULFATE (2.5 MG/3ML) 0.083% IN NEBU: 2.5000 mg | INHALATION_SOLUTION | RESPIRATORY_TRACT | Status: DC | PRN

## 2020-03-19 MED ORDER — VITAMIN B-12 1000 MCG PO TABS
2500.0000 ug | ORAL_TABLET | Freq: Every day | ORAL | Status: DC
  Administered 2020-03-20 – 2020-03-23 (×4): 2500 ug via ORAL
  Filled 2020-03-19: qty 3
  Filled 2020-03-19: qty 2.5
  Filled 2020-03-19 (×2): qty 3

## 2020-03-19 MED ORDER — MINOXIDIL 10 MG PO TABS
10.0000 mg | ORAL_TABLET | Freq: Two times a day (BID) | ORAL | Status: DC
  Administered 2020-03-19 – 2020-03-21 (×4): 10 mg via ORAL
  Filled 2020-03-19 (×4): qty 1

## 2020-03-19 MED ORDER — DORZOLAMIDE HCL-TIMOLOL MAL 2-0.5 % OP SOLN
1.0000 [drp] | Freq: Two times a day (BID) | OPHTHALMIC | Status: DC
  Administered 2020-03-19 – 2020-03-23 (×8): 1 [drp] via OPHTHALMIC
  Filled 2020-03-19: qty 10

## 2020-03-19 MED ORDER — INSULIN ASPART 100 UNIT/ML ~~LOC~~ SOLN
0.0000 [IU] | Freq: Three times a day (TID) | SUBCUTANEOUS | Status: DC
  Administered 2020-03-20: 1 [IU] via SUBCUTANEOUS
  Administered 2020-03-20 – 2020-03-21 (×3): 2 [IU] via SUBCUTANEOUS
  Administered 2020-03-22: 3 [IU] via SUBCUTANEOUS
  Administered 2020-03-22: 1 [IU] via SUBCUTANEOUS
  Administered 2020-03-22: 2 [IU] via SUBCUTANEOUS
  Administered 2020-03-23: 3 [IU] via SUBCUTANEOUS
  Administered 2020-03-23: 2 [IU] via SUBCUTANEOUS
  Filled 2020-03-19: qty 0.09

## 2020-03-19 MED ORDER — FAMOTIDINE 20 MG PO TABS
20.0000 mg | ORAL_TABLET | Freq: Every day | ORAL | Status: DC
  Administered 2020-03-20 – 2020-03-23 (×4): 20 mg via ORAL
  Filled 2020-03-19 (×4): qty 1

## 2020-03-19 MED ORDER — ADULT MULTIVITAMIN W/MINERALS CH
1.0000 | ORAL_TABLET | Freq: Every day | ORAL | Status: DC
  Administered 2020-03-19 – 2020-03-23 (×5): 1 via ORAL
  Filled 2020-03-19 (×5): qty 1

## 2020-03-19 MED ORDER — PRAVASTATIN SODIUM 40 MG PO TABS
40.0000 mg | ORAL_TABLET | Freq: Every day | ORAL | Status: DC
  Administered 2020-03-19 – 2020-03-22 (×4): 40 mg via ORAL
  Filled 2020-03-19 (×2): qty 1
  Filled 2020-03-19: qty 2
  Filled 2020-03-19: qty 1

## 2020-03-19 MED ORDER — INSULIN DETEMIR 100 UNIT/ML ~~LOC~~ SOLN
4.0000 [IU] | Freq: Every day | SUBCUTANEOUS | Status: DC
  Administered 2020-03-20: 4 [IU] via SUBCUTANEOUS
  Filled 2020-03-19 (×2): qty 0.04

## 2020-03-19 MED ORDER — LATANOPROST 0.005 % OP SOLN
1.0000 [drp] | Freq: Every day | OPHTHALMIC | Status: DC
  Administered 2020-03-20 – 2020-03-22 (×3): 1 [drp] via OPHTHALMIC
  Filled 2020-03-19: qty 2.5

## 2020-03-19 MED ORDER — FERROUS SULFATE 325 (65 FE) MG PO TABS
325.0000 mg | ORAL_TABLET | Freq: Two times a day (BID) | ORAL | Status: DC
  Administered 2020-03-20 – 2020-03-23 (×7): 325 mg via ORAL
  Filled 2020-03-19 (×7): qty 1

## 2020-03-19 MED ORDER — LEVOTHYROXINE SODIUM 100 MCG PO TABS
300.0000 ug | ORAL_TABLET | Freq: Every day | ORAL | Status: DC
  Administered 2020-03-20 – 2020-03-23 (×4): 300 ug via ORAL
  Filled 2020-03-19 (×4): qty 3

## 2020-03-19 MED ORDER — ATENOLOL 50 MG PO TABS
100.0000 mg | ORAL_TABLET | Freq: Every day | ORAL | Status: DC
  Administered 2020-03-20 – 2020-03-23 (×4): 100 mg via ORAL
  Filled 2020-03-19 (×2): qty 2
  Filled 2020-03-19: qty 1
  Filled 2020-03-19: qty 2

## 2020-03-19 MED ORDER — ASCORBIC ACID 500 MG PO TABS
1000.0000 mg | ORAL_TABLET | Freq: Every day | ORAL | Status: DC
  Administered 2020-03-20 – 2020-03-23 (×4): 1000 mg via ORAL
  Filled 2020-03-19 (×4): qty 2

## 2020-03-19 MED ORDER — ONDANSETRON HCL 4 MG/2ML IJ SOLN: 4.0000 mg | Freq: Four times a day (QID) | INTRAMUSCULAR | Status: DC | PRN

## 2020-03-19 MED ORDER — ONDANSETRON HCL 4 MG PO TABS: 4.0000 mg | ORAL_TABLET | Freq: Four times a day (QID) | ORAL | Status: DC | PRN

## 2020-03-19 MED ORDER — FUROSEMIDE 40 MG PO TABS
40.0000 mg | ORAL_TABLET | Freq: Every morning | ORAL | Status: DC
  Administered 2020-03-20: 40 mg via ORAL
  Filled 2020-03-19: qty 1

## 2020-03-19 NOTE — Progress Notes (Signed)
PHARMACIST - PHYSICIAN ORDER COMMUNICATION  CONCERNING: P&T Medication Policy on Herbal Medications  DESCRIPTION:  This patient's order for:  Joint health tablets  has been noted.  This product(s) is classified as an "herbal" or natural product. Due to a lack of definitive safety studies or FDA approval, nonstandard manufacturing practices, plus the potential risk of unknown drug-drug interactions while on inpatient medications, the Pharmacy and Therapeutics Committee does not permit the use of "herbal" or natural products of this type within [facility].   ACTION TAKEN: The pharmacy department is unable to verify this order at this time. Please reevaluate patient's clinical condition at discharge and address if the herbal or natural product(s) should be resumed at that time.  Lenis Noon, PharmD 03/19/20 7:10 PM

## 2020-03-19 NOTE — Telephone Encounter (Signed)
Patient called - LVM - went to ED over weekend with chest pain - was sent home. Still feels really bad and has chest pain. Wife took phone and stated they called Dr. Shon Baton office this morning and were informed to call 911 for EMS to ED. They wanted to know what Dr.Kale would recommend. Dr. Irene Limbo informed. Contacted patient 10 minutes after receipt of voice mail - Left message that Dr. Irene Limbo recommended they follow Dr. Shon Baton recommendation and call 911. Asked them to contact office to verify they received this message.  Patient contacted when no call in 1 hour. Wife answered phone - stated EMS was in the home at time of call and that they were evaluating patient prior to transport. She asked if patient should be brought to [facility] due to proximity to [facility] -- and placed EMT on phone. Advised EMS that per Dr. Irene Limbo, any hospital would be acceptable as patient is not in active treatment. EMT verbalized understanding and stated they would inform wife and patient.

## 2020-03-19 NOTE — ED Provider Notes (Addendum)
[facility] DEPT Provider Note   CSN: [id number] Arrival date & time: 03/19/20  1202     History Chief Complaint  Patient presents with  . Weakness    Dylan Jefferson is a 69 y.o. male w PMHx metastatic renal cell carcinoma s/p right nephrectomy, hypothyroidism, HTN, diabetes, presenting to the ED for re-evaluation of persisting CP and edema. Patient was evaluated on 03/17/20 for similar presentation. He states his symptoms have not improved much and feels as though his difficulty breathing is worsening over the last 4-5 days. States he is taking shallow breaths because deep breaths make his chest hurt. He feels better with laying down because deep breaths don't hurt as much. Reports CP is dull, worse with deep breathing, worse on the right. Patient also endorses swelling and discomfort to BLE and BUE,  treated with 40mg  furosemide daily, though not providing much improvement. He called his oncology office today and was recommended to return to the ED for re-evaluation. He is awaiting an outpatient nephrology consultation as well as an echocardiogram for further evaluation of this worsening swelling and renal insufficiency.   The history is provided by the patient and medical records.       Past Medical History:  Diagnosis Date  . Arthritis   . Cancer [facility])    Renal cell  . DDD (degenerative disc disease), cervical 05/28/2016  . DDD (degenerative disc disease), lumbar 05/28/2016  . Depression   . Diabetes ([place])   . Hyperlipidemia 05/28/2016  . Hypertension   . Hypothyroidism   . 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 Kidney disease   . Nephrolithiasis   . Osteoarthritis of both hands 05/28/2016  . Osteoarthritis of both knees 05/28/2016  . Peripheral vascular disorder ([place]) 05/28/2016  . 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 Rheumatoid arthritis ([place])   . Thyroid cancer ([place]) 05/28/2016    Patient Active Problem List   Diagnosis Date Noted  . Congestive heart failure (CHF) ([place]) 03/19/2020  . Counseling regarding advanced care planning and goals of care 06/11/2017  . Contracture, right elbow 12/12/2016  . 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 History of diabetes mellitus 07/24/2016  . History of esophagitis 07/24/2016  . Thyroid cancer ([place]) 05/28/2016  . Hyperlipidemia 05/28/2016  . 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 DDD (degenerative disc disease), cervical 05/28/2016  . DDD (degenerative disc disease), lumbar 05/28/2016  . Osteoarthritis of both knees 05/28/2016  . Osteoarthritis of both hands 05/28/2016  . Peripheral vascular disorder ([place]) 05/28/2016  . 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 Nodule of finger of left hand 10/11/2015  . Lung metastases ([place]) 09/06/2015  . Fatigue 08/16/2015  . Hyperbilirubinemia 08/16/2015  . Renal insufficiency 08/09/2015  . Encounter for chemotherapy management 07/16/2015  . 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 ([place]) 07/12/2015  . Personal history of renal cell cancer 03/29/2014  . Special screening for malignant neoplasms, colon 03/29/2014  . Esophageal varices with bleeding ([place]) 03/29/2014  . RENAL CELL CANCER, METASTATIC 12/04/2007  . Hypothyroidism 12/04/2007  . Diabetes mellitus ([place]) 12/04/2007  . HTN (hypertension) 12/04/2007  . ESOPHAGITIS 12/04/2007  . HEMOCCULT POSITIVE STOOL 12/04/2007  . HEADACHE, CHRONIC 12/04/2007    Past Surgical History:  Procedure Laterality Date  . CATARACT EXTRACTION Bilateral 2013  . CHOLECYSTECTOMY    . GASTRECTOMY     tumor removed  . LIPOMA EXCISION Right 1999  . NEPHRECTOMY Right   . PANCREATECTOMY    . SPLENECTOMY    .  THYROIDECTOMY    . URETERAL STENT PLACEMENT Left 2012  . WHIPPLE PROCEDURE         Family History  Problem Relation Age of Onset  . Bleeding Disorder Mother        Unknown what type  . Osteoporosis Mother   . Lymphoma Father         Hodgkin's lymphoma  . Colon cancer Neg Hx     Social History   Tobacco Use  . Smoking status: Former Smoker    Years: 1.50  . Smokeless tobacco: Never Used  Vaping Use  . Vaping Use: Never used  Substance Use Topics  . Alcohol use: Yes    Comment: occasional beer  . Drug use: No    Home Medications Prior to Admission medications   Medication Sig Start Date End Date Taking? Authorizing Provider  amLODipine (NORVASC) 5 MG tablet Take 10 mg by mouth 2 (two) times daily.  12/24/19  Yes [provider]  Ascorbic Acid (VITAMIN C) 1000 MG tablet Take 1,000 mg by mouth daily.   Yes [provider]  aspirin 81 MG tablet Take 81 mg by mouth daily.    Yes [provider]  atenolol (TENORMIN) 100 MG tablet Take 100 mg by mouth daily.  01/16/14  Yes [provider]  augmented betamethasone dipropionate (DIPROLENE-AF) 0.05 % cream Apply 1 application topically daily as needed (rash).  03/05/15  Yes [provider]  calcium citrate-vitamin D (CITRACAL+D) 315-200 MG-UNIT per tablet Take 2 tablets by mouth 2 (two) times daily.    Yes [provider]  Cyanocobalamin (VITAMIN B-12) 2500 MCG SUBL Take 2,500 mcg by mouth daily.    Yes [provider]  diclofenac Sodium (VOLTAREN) 1 % GEL Apply 3 g topically 3 (three) times daily as needed (joint pain).  02/09/20  Yes [provider]  docusate sodium (COLACE) 100 MG capsule Take 100 mg by mouth 2 (two) times daily as needed for mild constipation.    Yes [provider]  dorzolamide-timolol (COSOPT) 22.3-6.8 MG/ML ophthalmic solution Place 1 drop into both eyes 2 (two) times daily.  11/08/15  Yes [provider]  famotidine (PEPCID) 20 MG tablet Take 20 mg by mouth 2 (two) times daily.   Yes [provider]  ferrous sulfate 325 (65 FE) MG tablet Take 325 mg by mouth 2 (two) times daily with a meal.    Yes [provider]  furosemide (LASIX) 40 MG tablet  Take 40 mg by mouth every morning. 02/28/20  Yes [provider]  Glucosamine-MSM-Hyaluronic Acd ([place]) 750-375-30 MG TABS Take 2 tablets by mouth daily.   Yes [provider]  insulin lispro (HUMALOG) 100 UNIT/ML injection Inject 2-4 Units into the skin 3 (three) times daily before meals. Per sliding scale   Yes [provider]  Iron-Vitamins (GERITOL COMPLETE) TABS Take 1 tablet by mouth daily.   Yes [provider]  Lactobacillus (ACIDOPHILUS) CAPS capsule Take 1 capsule by mouth daily.   Yes [provider]  latanoprost (XALATAN) 0.005 % ophthalmic solution Place 1 drop into both eyes at bedtime.  11/08/15  Yes [provider]  LEVEMIR 100 UNIT/ML injection Inject 4 Units into the skin in the morning.  03/05/15  Yes [provider]  levothyroxine (SYNTHROID, LEVOTHROID) 300 MCG tablet Take 300 mcg by mouth daily before breakfast.  01/19/14  Yes [provider]  lovastatin (MEVACOR) 40 MG tablet Take 40 mg by mouth at bedtime.  01/18/14  Yes [provider]  minoxidil (LONITEN) 10 MG tablet Take 10 mg by mouth 2 (two) times daily.  02/22/14  Yes [provider]  Omega-3 Fatty Acids (FISH OIL) 1200 MG CAPS Take 1 capsule by mouth daily.   Yes [provider]  Pancrelipase, Lip-Prot-Amyl, (CREON) 6000 UNITS CPEP Take 4 capsules by mouth 3 (three) times daily.   Yes [provider]  potassium chloride SA (KLOR-CON) 20 MEQ tablet Take 20 mEq by mouth every evening.  02/28/20  Yes [provider]  sulindac (CLINORIL) 150 MG tablet Take 150 mg by mouth daily as needed (pain).  03/12/20  Yes [provider]  amLODipine (NORVASC) 10 MG tablet take 1 tablet by mouth once daily Patient not taking: Reported on 03/31/2018 04/07/17   Brunetta Genera, MD  SUTENT 37.5 MG capsule TAKE 1 CAPSULE (37.5 MG) BY MOUTH ONCE DAILY FOR 2 WEEKS ON, 1 WEEK OFF Patient not taking: Reported on  03/19/2020 03/08/20   Brunetta Genera, MD    Allergies    Patient has no known allergies.  Review of Systems   Review of Systems  Constitutional: Positive for fatigue.  Respiratory: Positive for shortness of breath.   Cardiovascular: Positive for chest pain and leg swelling.  Allergic/Immunologic: Positive for immunocompromised state.  All other systems reviewed and are negative.   Physical Exam Updated Vital Signs BP 117/69   Pulse 69   Temp 100.2 F (37.9 C) (Oral)   Resp 12   Ht 5\' 10"  (1.778 m)   Wt 93 kg   SpO2 100%   BMI 29.42 kg/m   Physical Exam Vitals and nursing note reviewed.  Constitutional:      Appearance: He is well-developed. He is ill-appearing.     Comments: Pt is pale  HENT:     Head: Normocephalic and atraumatic.  Eyes:     Conjunctiva/sclera: Conjunctivae normal.  Cardiovascular:     Rate and Rhythm: Normal rate and regular rhythm.  Pulmonary:     Effort: Pulmonary effort is normal. No respiratory distress.     Breath sounds: Normal breath sounds.  Chest:     Chest wall: Tenderness (Right anterior upper upper chest wall tenderness) present.  Abdominal:     General: Bowel sounds are normal.     Palpations: Abdomen is soft.     Tenderness: There is no abdominal tenderness.  Musculoskeletal:     Comments: 3+pitting edema to BLE, BUE are also edematous  Skin:    General: Skin is warm.  Neurological:     Mental Status: He is alert.  Psychiatric:        Behavior: Behavior normal.     ED Results / Procedures / Treatments   Labs (all labs ordered are listed, but only abnormal results are displayed) Labs Reviewed  COMPREHENSIVE METABOLIC PANEL - Abnormal; Notable for the following components:      Result Value   CO2 20 (*)    Glucose, Bld 105 (*)    BUN 45 (*)    Creatinine, Ser 2.82 (*)    Calcium 7.9 (*)    Total Protein 4.8 (*)    Albumin 2.4 (*)    GFR calc non Af Amer 22 (*)    GFR calc Af Amer 25 (*)    All other components  within normal limits  CBC WITH DIFFERENTIAL/PLATELET - Abnormal; Notable for the following components:   WBC 1.7 (*)    RBC 2.99 (*)  Hemoglobin 11.6 (*)    HCT 34.9 (*)    MCV 116.7 (*)    MCH 38.8 (*)    RDW 15.8 (*)    Neutro Abs 0.5 (*)    Lymphs Abs 0.6 (*)    All other components within normal limits  BRAIN NATRIURETIC PEPTIDE - Abnormal; Notable for the following components:   B Natriuretic Peptide 394.4 (*)    All other components within normal limits  PROTEIN / CREATININE RATIO, URINE - Abnormal; Notable for the following components:   Protein Creatinine Ratio 0.53 (*)    All other components within normal limits  CREATININE, SERUM - Abnormal; Notable for the following components:   Creatinine, Ser 2.76 (*)    GFR calc non Af Amer 22 (*)    GFR calc Af Amer 26 (*)    All other components within normal limits  CBG MONITORING, ED - Abnormal; Notable for the following components:   Glucose-Capillary 63 (*)    All other components within normal limits  CBG MONITORING, ED - Abnormal; Notable for the following components:   Glucose-Capillary 60 (*)    All other components within normal limits  CBG MONITORING, ED - Abnormal; Notable for the following components:   Glucose-Capillary 107 (*)    All other components within normal limits  CBG MONITORING, ED - Abnormal; Notable for the following components:   Glucose-Capillary 132 (*)    All other components within normal limits  TROPONIN I (HIGH SENSITIVITY) - Abnormal; Notable for the following components:   Troponin I (High Sensitivity) 19 (*)    All other components within normal limits  SARS CORONAVIRUS 2 BY RT PCR (HOSPITAL ORDER, [facility])  NA AND K (SODIUM & POTASSIUM), RAND UR  OSMOLALITY, URINE  HIV ANTIBODY (ROUTINE TESTING W REFLEX)  HEMOGLOBIN O2U  BASIC METABOLIC PANEL  CBC WITH DIFFERENTIAL/PLATELET    EKG EKG Interpretation  Date/Time:  Monday March 19 2020 14:01:49  EDT Ventricular Rate:  70 PR Interval:    QRS Duration: 120 QT Interval:  409 QTC Calculation: 442 R Axis:   -61 Text Interpretation: Sinus rhythm Atrial premature complex Left anterior fascicular block No significant change since last tracing Confirmed by Dorie Rank [phone]) on 03/19/2020 2:06:28 PM   Radiology DG Chest 2 View  Result Date: 03/19/2020 CLINICAL DATA:  69 year old male with history of chest pain and shortness of breath. EXAM: CHEST - 2 VIEW COMPARISON:  Chest x-ray 03/17/2020. FINDINGS: Linear areas of architectural distortion in the right mid to lower lung. Left lung is clear. No pleural effusions. No pneumothorax. No evidence of pulmonary edema. Heart size is normal. Upper mediastinal contours are within normal limits. IMPRESSION: 1. Linear areas of architectural distortion in the right mid to lower lung likely to reflect areas of evolving postradiation change. No radiographic evidence of acute cardiopulmonary disease. Electronically Signed   By: Vinnie Langton M.D.   On: 03/19/2020 14:02   DG Chest 2 View  Result Date: 03/17/2020 CLINICAL DATA:  Chest pain EXAM: CHEST - 2 VIEW COMPARISON:  06/13/2015, PET-CT 03/22/2018, CT chest 03/06/2020 FINDINGS: Small left pleural effusion with mild basilar atelectasis. Scarring and post treatment change in the right mid lung as demonstrated on prior CT. Borderline cardiomegaly. No pneumothorax. IMPRESSION: Small left pleural effusion with mild basilar atelectasis. Stable scarring and post treatment changes in the right mid lung. Electronically Signed   By: Donavan Foil M.D.   On: 03/17/2020 21:49   US RENAL  Result Date: 03/19/2020 CLINICAL DATA:  69 year old male with acute renal insufficiency. Prior right nephrectomy. EXAM: RENAL / URINARY TRACT ULTRASOUND COMPLETE COMPARISON:  Renal ultrasound dated 12/17/2010. FINDINGS: Right Kidney: Nephrectomy. Left Kidney: Renal measurements: 11.6 x 7.0 x 8.3 cm = volume: 336 mL. Normal  echogenicity. There is an 11 mm echogenic focus in the inferior pole of the left kidney, likely a nonobstructing calculus. No hydronephrosis. Bladder: Appears normal for degree of bladder distention. Other: Small free fluid noted within the pelvis. IMPRESSION: 1. Status post prior right nephrectomy. 2. A nonobstructing left renal inferior pole calculus. No hydronephrosis. 3. Small ascites. Electronically Signed   By: Anner Crete M.D.   On: 03/19/2020 18:46    Procedures Procedures (including critical care time)  Medications Ordered in ED Medications  heparin injection 5,000 Units (5,000 Units Subcutaneous Given 03/19/20 2048)  ondansetron (ZOFRAN) tablet 4 mg (has no administration in time range)    Or  ondansetron (ZOFRAN) injection 4 mg (has no administration in time range)  albuterol (PROVENTIL) (2.5 MG/3ML) 0.083% nebulizer solution 2.5 mg (has no administration in time range)  insulin aspart (novoLOG) injection 0-9 Units (0 Units Subcutaneous Refused 03/19/20 2023)  traMADol (ULTRAM) tablet 50 mg (50 mg Oral Given 03/19/20 2032)  amLODipine (NORVASC) tablet 10 mg (has no administration in time range)  aspirin EC tablet 81 mg (has no administration in time range)  ascorbic acid (VITAMIN C) tablet 1,000 mg (has no administration in time range)  atenolol (TENORMIN) tablet 100 mg (has no administration in time range)  vitamin B-12 (CYANOCOBALAMIN) tablet 2,500 mcg (has no administration in time range)  diclofenac Sodium (VOLTAREN) 1 % topical gel 2 g (has no administration in time range)  dorzolamide-timolol (COSOPT) 22.3-6.8 MG/ML ophthalmic solution 1 drop (has no administration in time range)  famotidine (PEPCID) tablet 20 mg (has no administration in time range)  ferrous sulfate tablet 325 mg (has no administration in time range)  furosemide (LASIX) tablet 40 mg (has no administration in time range)  multivitamin with minerals tablet 1 tablet (1 tablet Oral Given 03/19/20 2032)   acidophilus (RISAQUAD) capsule 1 capsule (has no administration in time range)  latanoprost (XALATAN) 0.005 % ophthalmic solution 1 drop (has no administration in time range)  insulin detemir (LEVEMIR) injection 4 Units (has no administration in time range)  levothyroxine (SYNTHROID) tablet 300 mcg (has no administration in time range)  pravastatin (PRAVACHOL) tablet 40 mg (40 mg Oral Given 03/19/20 2031)  minoxidil (LONITEN) tablet 10 mg (10 mg Oral Given 03/19/20 2048)  lipase/protease/amylase (CREON) capsule 12,000 Units (12,000 Units Oral Given 03/19/20 2031)  calcium-vitamin D (OSCAL WITH D) 500-200 MG-UNIT per tablet 1 tablet (has no administration in time range)    ED Course  I have reviewed the triage vital signs and the nursing notes.  Pertinent labs & imaging results that were available during my care of the patient were reviewed by me and considered in my medical decision making (see chart for details).    MDM Rules/Calculators/A&P                          Patient with metastatic renal cell carcinoma status post prior right nephrectomy, presenting to the ED for reevaluation of worsening anasarca, chest pain, shortness of breath.  He was evaluated a few days ago in the ED with negative work-up and sent home.  He has been having worsening symptoms and having poor functioning at home, therefore oncology recommended  he report back to the ED for re-evaluation.  He is pending outpatient echocardiogram and nephrology consultation for worsening renal function over the last 6 months, as well as this new worsening anasarca with shortness of breath and chest pain.  Of note, he does have chronic right anterior fourth rib fracture, and patient has reproducible tenderness on exam in this area.  He states this is where his chest pain is localized to.  patient is ill-appearing, pale.  Anasarca is present with pitting edema to bilateral lower extremities as well as upper extremities.  Repeat cardiac  work-up today reveals stable troponin, BNP is 400, creatinine is 2.4, normal LFTs, leukopenia of 1.7.  Albumin is low at 2.8 Covid test is negative.  Chest x-ray without large effusion or infiltrate.  Consulted with Dr. Irene Limbo, patient's oncologist.  He recommends admission for more urgent work-up for presenting symptoms.  He does not believe this is due to patient's cancer.  Considered PE, however cannot perform CTA here in the ED due to renal insufficiency.  Chest pain is reproducible with palpation of the location of his chronic right rib fracture.  May benefit from VQ scan during admission, more urgent echocardiogram to evaluate for new onset CHF, IV diuresis.  Dr. Lupita Leash accepting admission for further management.    Final Clinical Impression(s) / ED Diagnoses Final diagnoses:  Renal insufficiency  Anasarca  SOB (shortness of breath)  Chest wall pain    Rx / DC Orders ED Discharge Orders    None       Bowen Kia, [place] N, PA-C 03/19/20 2138    Ralonda Tartt, [place] N, PA-C 03/19/20 2139    Dorie Rank, MD 03/20/20 1036

## 2020-03-19 NOTE — ED Notes (Signed)
Pt provided with 2- 4 oz apple juice for cbg of 63.  Pt AOx4.  EDP [place] made aware.

## 2020-03-19 NOTE — H&P (Signed)
History and Physical    Dylan Jefferson YKD:[id number] DOB: [birth year] DOA: 03/19/2020  PCP: [name], MD   Patient coming from:   Chief Complaint  Patient presents with  . Weakness      HPI: Dylan Jefferson is a 69 y.o. male with medical history significant for metastatic renal carcinoma with right nephrectomy, hypothyroidism, hypertension, diabetes mellitus, renal insufficiency, history of thyroid cancer, hypertension, hyperlipidemia, diabetes mellitus, depression, arthritis comes to the ED for evaluation of chest pain not feeling well. He reports he started to have swelling in legs since end of May and was seeing pcp and was told to increase his protein for low albumin. He was supposed to have nephrology eval and echo as outpatient. He had gained substantial weight, he was placed on lasix 40 mg since July 26 ( stopped his hctz and increased amlodipine to 10 mg bid and he has been peeing and lasix has  helped some. He started having shortness of breath DOE since last 2 days. He is having pain all over and on rt mid chest where he has rib " crack" for few months. He was seen in on 8/14 and back again due to same issues. He is having diarrehea with greenish loose stool, since 3 days and is on imodium. Patient otherwise denies any nausea,vomiting,fever, chills, headache, focal weakness, numbness tingling, speech difficulties.   ED Course:Vitals are stable afebrile, heart rate in 60s to 70s not hypoxic.  Work-up shows slightly positive troponin but borderline and elevated BNP in 390s, elevated creatinine,, blood sugar in 60.  Due to uncontrolled pain and given that patient has had recurrent elevated admission was requested to have him undergo further work-up for his renal dysfunction and possible CHF and fluid overload issues.  Review of Systems: All systems were reviewed and were negative except as mentioned in HPI above. Negative for fever. Negative for focal weakness Negative for  abdomen pain  Past Medical History:  Diagnosis Date  . Arthritis   . Cancer [facility])    Renal cell  . DDD (degenerative disc disease), cervical 05/28/2016  . DDD (degenerative disc disease), lumbar 05/28/2016  . Depression   . Diabetes ([place])   . Hyperlipidemia 05/28/2016  . Hypertension   . Hypothyroidism   . 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 Kidney disease   . Nephrolithiasis   . Osteoarthritis of both hands 05/28/2016  . Osteoarthritis of both knees 05/28/2016  . Peripheral vascular disorder ([place]) 05/28/2016  . 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 Rheumatoid arthritis ([place])   . Thyroid cancer ([place]) 05/28/2016    Past Surgical History:  Procedure Laterality Date  . CATARACT EXTRACTION Bilateral 2013  . CHOLECYSTECTOMY    . GASTRECTOMY     tumor removed  . LIPOMA EXCISION Right 1999  . NEPHRECTOMY Right   . PANCREATECTOMY    . SPLENECTOMY    . THYROIDECTOMY    . URETERAL STENT PLACEMENT Left 2012  . WHIPPLE PROCEDURE       reports that he has quit smoking. He quit after 1.50 years of use. He has never used smokeless tobacco. He reports current alcohol use. He reports that he does not use drugs.  No Known Allergies  Family History  Problem Relation Age of Onset  . Bleeding Disorder Mother        Unknown what type  . Osteoporosis Mother   . Lymphoma Father        Hodgkin's lymphoma  .  Colon cancer Neg Hx      Prior to Admission medications   Medication Sig Start Date End Date Taking? Authorizing Provider  amLODipine (NORVASC) 5 MG tablet Take 10 mg by mouth 2 (two) times daily.  12/24/19  Yes [provider]  Ascorbic Acid (VITAMIN C) 1000 MG tablet Take 1,000 mg by mouth daily.   Yes [provider]  aspirin 81 MG tablet Take 81 mg by mouth daily.    Yes [provider]  atenolol (TENORMIN) 100 MG tablet Take 100 mg by mouth daily.  01/16/14  Yes [provider]    augmented betamethasone dipropionate (DIPROLENE-AF) 0.05 % cream Apply 1 application topically daily as needed (rash).  03/05/15  Yes [provider]  calcium citrate-vitamin D (CITRACAL+D) 315-200 MG-UNIT per tablet Take 2 tablets by mouth 2 (two) times daily.    Yes [provider]  Cyanocobalamin (VITAMIN B-12) 2500 MCG SUBL Take 2,500 mcg by mouth daily.    Yes [provider]  diclofenac Sodium (VOLTAREN) 1 % GEL Apply 3 g topically 3 (three) times daily as needed (joint pain).  02/09/20  Yes [provider]  docusate sodium (COLACE) 100 MG capsule Take 100 mg by mouth 2 (two) times daily as needed for mild constipation.    Yes [provider]  dorzolamide-timolol (COSOPT) 22.3-6.8 MG/ML ophthalmic solution Place 1 drop into both eyes 2 (two) times daily.  11/08/15  Yes [provider]  famotidine (PEPCID) 20 MG tablet Take 20 mg by mouth 2 (two) times daily.   Yes [provider]  ferrous sulfate 325 (65 FE) MG tablet Take 325 mg by mouth 2 (two) times daily with a meal.    Yes [provider]  furosemide (LASIX) 40 MG tablet Take 40 mg by mouth every morning. 02/28/20  Yes [provider]  Glucosamine-MSM-Hyaluronic Acd ([place]) 750-375-30 MG TABS Take 2 tablets by mouth daily.   Yes [provider]  insulin lispro (HUMALOG) 100 UNIT/ML injection Inject 2-4 Units into the skin 3 (three) times daily before meals. Per sliding scale   Yes [provider]  Iron-Vitamins (GERITOL COMPLETE) TABS Take 1 tablet by mouth daily.   Yes [provider]  Lactobacillus (ACIDOPHILUS) CAPS capsule Take 1 capsule by mouth daily.   Yes [provider]  latanoprost (XALATAN) 0.005 % ophthalmic solution Place 1 drop into both eyes at bedtime.  11/08/15  Yes [provider]  LEVEMIR 100 UNIT/ML injection Inject 4 Units into the skin in the morning.  03/05/15  Yes [provider]   levothyroxine (SYNTHROID, LEVOTHROID) 300 MCG tablet Take 300 mcg by mouth daily before breakfast.  01/19/14  Yes [provider]  lovastatin (MEVACOR) 40 MG tablet Take 40 mg by mouth at bedtime.  01/18/14  Yes [provider]  minoxidil (LONITEN) 10 MG tablet Take 10 mg by mouth 2 (two) times daily.  02/22/14  Yes [provider]  Omega-3 Fatty Acids (FISH OIL) 1200 MG CAPS Take 1 capsule by mouth daily.   Yes [provider]  Pancrelipase, Lip-Prot-Amyl, (CREON) 6000 UNITS CPEP Take 4 capsules by mouth 3 (three) times daily.   Yes [provider]  potassium chloride SA (KLOR-CON) 20 MEQ tablet Take 20 mEq by mouth every evening.  02/28/20  Yes [provider]  sulindac (CLINORIL) 150 MG tablet Take 150 mg by mouth daily as needed (pain).  03/12/20  Yes [provider]  amLODipine (NORVASC) 10 MG  tablet take 1 tablet by mouth once daily Patient not taking: Reported on 03/31/2018 04/07/17   Brunetta Genera, MD  SUTENT 37.5 MG capsule TAKE 1 CAPSULE (37.5 MG) BY MOUTH ONCE DAILY FOR 2 WEEKS ON, 1 WEEK OFF Patient not taking: Reported on 03/19/2020 03/08/20   Brunetta Genera, MD    Physical Exam: Vitals:   03/19/20 1231 03/19/20 1300 03/19/20 1500 03/19/20 1538  BP:  123/69 123/71 (!) 135/116  Pulse:  69 70 72  Resp:  (!) 21  (!) 26  Temp:      TempSrc:      SpO2:  96%  100%  Weight: 93 kg     Height: 5\' 10"  (1.778 m)       General exam: AAOx3,NAD, weak appearing. HEENT:Oral mucosa moist, Ear/Nose WNL grossly, dentition normal. Respiratory system: bilaterally diminished, tender on rt chest, no wheezing or crackles,no use of accessory muscle Cardiovascular system: S1 & S2 +, No JVD. Gastrointestinal system: Abdomen soft, NT,ND, BS+ Nervous System:Alert, awake, moving extremities and grossly nonfocal Extremities: b/l LE edema, distal peripheral pulses palpable.  Skin: No rashes,no icterus. MSK: Normal muscle bulk,tone,  power.  Labs on Admission: I have personally reviewed following labs and imaging studies  CBC: Recent Labs  Lab 03/15/20 0911 03/17/20 2129 03/19/20 1313  WBC 4.2 1.6* 1.7*  NEUTROABS 2.2  --  0.5*  HGB 12.0* 11.6* 11.6*  HCT 35.6* 34.4* 34.9*  MCV 115.2* 117.4* 116.7*  PLT 261 226 245   Basic Metabolic Panel: Recent Labs  Lab 03/15/20 0911 03/17/20 2129 03/19/20 1313  NA 137 136 138  K 4.9 5.0 5.0  CL 112* 113* 111  CO2 19* 17* 20*  GLUCOSE 240* 94 105*  BUN 32* 43* 45*  CREATININE 2.85* 3.18* 2.82*  CALCIUM 8.1* 7.8* 7.9*   GFR: Estimated Creatinine Clearance: 27.9 mL/min (A) (by C-G formula based on SCr of 2.82 mg/dL (H)). Liver Function Tests: Recent Labs  Lab 03/15/20 0911 03/19/20 1313  AST 40 38  ALT 33 23  ALKPHOS 135* 65  BILITOT 0.9 1.2  PROT 4.8* 4.8*  ALBUMIN 2.5* 2.4*   No results for input(s): LIPASE, AMYLASE in the last 168 hours. No results for input(s): AMMONIA in the last 168 hours. Coagulation Profile: No results for input(s): INR, PROTIME in the last 168 hours. Cardiac Enzymes: No results for input(s): CKTOTAL, CKMB, CKMBINDEX, TROPONINI in the last 168 hours. BNP (last 3 results) No results for input(s): PROBNP in the last 8760 hours. HbA1C: No results for input(s): HGBA1C in the last 72 hours. CBG: Recent Labs  Lab 03/19/20 1520 03/19/20 1552 03/19/20 1634  GLUCAP 63* 60* 107*   Lipid Profile: No results for input(s): CHOL, HDL, LDLCALC, TRIG, CHOLHDL, LDLDIRECT in the last 72 hours. Thyroid Function Tests: No results for input(s): TSH, T4TOTAL, FREET4, T3FREE, THYROIDAB in the last 72 hours. Anemia Panel: No results for input(s): VITAMINB12, FOLATE, FERRITIN, TIBC, IRON, RETICCTPCT in the last 72 hours. Urine analysis:    Component Value Date/Time   COLORURINE YELLOW 03/15/2020 [place] 03/15/2020 1112   LABSPEC 1.009 03/15/2020 1112   LABSPEC 1.020 08/16/2015 0906   PHURINE 5.0 03/15/2020 1112    GLUCOSEU NEGATIVE 03/15/2020 1112   GLUCOSEU Negative 08/16/2015 0906   HGBUR NEGATIVE 03/15/2020 1112   BILIRUBINUR NEGATIVE 03/15/2020 1112   BILIRUBINUR Negative 08/16/2015 0906   KETONESUR NEGATIVE 03/15/2020 1112   PROTEINUR 30 (A) 03/15/2020 1112   UROBILINOGEN 0.2 08/16/2015 0906  NITRITE NEGATIVE 03/15/2020 1112   LEUKOCYTESUR NEGATIVE 03/15/2020 1112   LEUKOCYTESUR Negative 08/16/2015 0906    Radiological Exams on Admission: DG Chest 2 View  Result Date: 03/19/2020 CLINICAL DATA:  69 year old male with history of chest pain and shortness of breath. EXAM: CHEST - 2 VIEW COMPARISON:  Chest x-ray 03/17/2020. FINDINGS: Linear areas of architectural distortion in the right mid to lower lung. Left lung is clear. No pleural effusions. No pneumothorax. No evidence of pulmonary edema. Heart size is normal. Upper mediastinal contours are within normal limits. IMPRESSION: 1. Linear areas of architectural distortion in the right mid to lower lung likely to reflect areas of evolving postradiation change. No radiographic evidence of acute cardiopulmonary disease. Electronically Signed   By: Vinnie Langton M.D.   On: 03/19/2020 14:02   DG Chest 2 View  Result Date: 03/17/2020 CLINICAL DATA:  Chest pain EXAM: CHEST - 2 VIEW COMPARISON:  06/13/2015, PET-CT 03/22/2018, CT chest 03/06/2020 FINDINGS: Small left pleural effusion with mild basilar atelectasis. Scarring and post treatment change in the right mid lung as demonstrated on prior CT. Borderline cardiomegaly. No pneumothorax. IMPRESSION: Small left pleural effusion with mild basilar atelectasis. Stable scarring and post treatment changes in the right mid lung. Electronically Signed   By: Donavan Foil M.D.   On: 03/17/2020 21:49     Assessment/Plan  Fluid overload generalized swelling, BNP slightly elevated in 300s, rule out congestive heart failure versus renal etiology-nephrotic syndrome.He was supposed to get echocardiogram as  outpatient, will order echocardiogram here.  Troponin is borderline positive.  Continue his home Lasix and monitor BMP closely and obtain nephrology consultation tomorrow  Pleuritic chest pain on the right ant rib-on the CT chest on 8/3 he does have ununited pathological anterior right fourth rib fractures with underlying mixed lytic and sclerotic changes/history of radiation-with postradiation changes with consolidation in the right middle lobe and anterior right lower lobe on that CT chest.  He is not hypoxic not tachycardic, doubt PE. add Tramadol for pain management.will get his oncology team to consult him.  Renal insufficiency/metabolic acidosis creatinine at 3.82 days ago today 3.18, mild hyperkalemia at 5.0.  Creatinine has been high ranging 1.4-1.8 in 2020 and since January of this year he has been ranging in 1.8-2.1.  He does have outpatient nephrology evaluation scheduled but has not gotten a chance to see yet.  Question etiology renal dysfunction versus progressive CKD.  T abdomen few days ago no hydronephrosis, abdominal ultrasound, look for proteinuria, urinary lites.  Will benefit w/ nephro eval tomorrow while here.  Metastatic renal cell carcinoma history of right nephrectomy.  He did have his metastatic disease is, he was [place] po at home and currently on hold by his oncologist till his renal/cardiac issues sorted out  Leukopenia WBC count 1600.  [place] 500 Keep neutropenic precaution,Monitor CBC. Will ask his oncology to see him in am.  Hypothyroidism-check tsh.  Resume Synthroid  Diabetes mellitus with hypoglycemia in the ED.  We will just keep him on sliding scale insulin monitor.  Patient reports that he does drop sugar by 100 during night - keep q 4-hour's cbg check  HTN : On atenolol, amlodipine 10 mg twice daily and minoxidil.  Keep his home meds but change amlodipine to daily, wonder if swelling is associated/worsened by CCB.  Body mass index is 29.42 kg/m.   Severity of  Illness: The appropriate patient status for this patient is OBSERVATION. Observation status is judged to be reasonable and necessary in order  to provide the required intensity of service to ensure the patient's safety. The patient's presenting symptoms, physical exam findings, and initial radiographic and laboratory data in the context of their medical condition is felt to place them at decreased risk for further clinical deterioration.    DVT prophylaxis: heparin injection 5,000 Units Start: 03/19/20 2200 SCDs Start: 03/19/20 1811 Code Status:   Code Status: Full Code  Family Communication: Admission, patients condition and plan of care including tests being ordered have been discussed with the patient and his wife  who indicate understanding and agree with the plan and Code Status.  Consults called:  None  Antonieta Pert MD [facility]  If 7PM-7AM, please contact night-coverage [email or web address]  03/19/2020, 6:30 PM

## 2020-03-19 NOTE — ED Notes (Signed)
Pts CBG checked at pts spouse request.

## 2020-03-19 NOTE — ED Triage Notes (Signed)
Pt BIBA from home-  Per EMS- Pt seen here Saturday.  Today, pt c/o pain from shoulders to knees x2 weeks, progressively worsening.  R sided chest wall pain, worse with breathing/movement.   SHOB due to pain with inspiration. Edema to 4 extremities x1.5 weeks.   Intermittent dizziness.   AOx4, ambulatory with assistance.

## 2020-03-20 ENCOUNTER — Observation Stay (HOSPITAL_BASED_OUTPATIENT_CLINIC_OR_DEPARTMENT_OTHER): Payer: Medicare Other

## 2020-03-20 DIAGNOSIS — R079 Chest pain, unspecified: Secondary | ICD-10-CM

## 2020-03-20 DIAGNOSIS — R0602 Shortness of breath: Secondary | ICD-10-CM

## 2020-03-20 DIAGNOSIS — I10 Essential (primary) hypertension: Secondary | ICD-10-CM | POA: Diagnosis not present

## 2020-03-20 LAB — CBC WITH DIFFERENTIAL/PLATELET
Abs Immature Granulocytes: 0.13 10*3/uL — ABNORMAL HIGH (ref 0.00–0.07)
Basophils Absolute: 0 10*3/uL (ref 0.0–0.1)
Basophils Relative: 0 %
Eosinophils Absolute: 0.2 10*3/uL (ref 0.0–0.5)
Eosinophils Relative: 4 %
HCT: 29.9 % — ABNORMAL LOW (ref 39.0–52.0)
Hemoglobin: 10.1 g/dL — ABNORMAL LOW (ref 13.0–17.0)
Immature Granulocytes: 3 %
Lymphocytes Relative: 29 %
Lymphs Abs: 1.1 10*3/uL (ref 0.7–4.0)
MCH: 39.9 pg — ABNORMAL HIGH (ref 26.0–34.0)
MCHC: 33.8 g/dL (ref 30.0–36.0)
MCV: 118.2 fL — ABNORMAL HIGH (ref 80.0–100.0)
Monocytes Absolute: 1.1 10*3/uL — ABNORMAL HIGH (ref 0.1–1.0)
Monocytes Relative: 29 %
Neutro Abs: 1.4 10*3/uL — ABNORMAL LOW (ref 1.7–7.7)
Neutrophils Relative %: 35 %
Platelets: 196 10*3/uL (ref 150–400)
RBC: 2.53 MIL/uL — ABNORMAL LOW (ref 4.22–5.81)
RDW: 16 % — ABNORMAL HIGH (ref 11.5–15.5)
WBC: 4 10*3/uL (ref 4.0–10.5)
nRBC: 0 % (ref 0.0–0.2)

## 2020-03-20 LAB — GLUCOSE, CAPILLARY
Glucose-Capillary: 136 mg/dL — ABNORMAL HIGH (ref 70–99)
Glucose-Capillary: 42 mg/dL — CL (ref 70–99)
Glucose-Capillary: 67 mg/dL — ABNORMAL LOW (ref 70–99)
Glucose-Capillary: 74 mg/dL (ref 70–99)

## 2020-03-20 LAB — BASIC METABOLIC PANEL
Anion gap: 10 (ref 5–15)
BUN: 41 mg/dL — ABNORMAL HIGH (ref 8–23)
CO2: 14 mmol/L — ABNORMAL LOW (ref 22–32)
Calcium: 6.7 mg/dL — ABNORMAL LOW (ref 8.9–10.3)
Chloride: 120 mmol/L — ABNORMAL HIGH (ref 98–111)
Creatinine, Ser: 2.36 mg/dL — ABNORMAL HIGH (ref 0.61–1.24)
GFR calc Af Amer: 31 mL/min — ABNORMAL LOW (ref >60)
GFR calc non Af Amer: 27 mL/min — ABNORMAL LOW (ref >60)
Glucose, Bld: 129 mg/dL — ABNORMAL HIGH (ref 70–99)
Potassium: 3.8 mmol/L (ref 3.5–5.1)
Sodium: 144 mmol/L (ref 135–145)

## 2020-03-20 LAB — UPEP/UIFE/LIGHT CHAINS/TP, 24-HR UR
% BETA, Urine: 12.8 %
ALPHA 1 URINE: 2.7 %
Albumin, U: 71.7 %
Alpha 2, Urine: 3.1 %
Free Kappa Lt Chains,Ur: 23.74 mg/L (ref 0.63–113.79)
Free Kappa/Lambda Ratio: 7.63 (ref 1.03–31.76)
Free Lambda Lt Chains,Ur: 3.11 mg/L (ref 0.47–11.77)
GAMMA GLOBULIN URINE: 9.7 %
Total Protein, Urine-Ur/day: 614 mg/24 hr — ABNORMAL HIGH (ref 30–150)
Total Protein, Urine: 33.2 mg/dL
Total Volume: 1850

## 2020-03-20 LAB — ECHOCARDIOGRAM COMPLETE
Area-P 1/2: 2.36 cm2
Calc EF: 63.8 %
Height: 70 in
S' Lateral: 2.8 cm
Single Plane A2C EF: 67.6 %
Single Plane A4C EF: 55.6 %
Weight: 3280.44 oz

## 2020-03-20 LAB — CBG MONITORING, ED
Glucose-Capillary: 70 mg/dL (ref 70–99)
Glucose-Capillary: 87 mg/dL (ref 70–99)
Glucose-Capillary: 66 mg/dL — ABNORMAL LOW (ref 70–99)
Glucose-Capillary: 102 mg/dL — ABNORMAL HIGH (ref 70–99)
Glucose-Capillary: 165 mg/dL — ABNORMAL HIGH (ref 70–99)
Glucose-Capillary: 127 mg/dL — ABNORMAL HIGH (ref 70–99)

## 2020-03-20 LAB — HIV ANTIBODY (ROUTINE TESTING W REFLEX): HIV Screen 4th Generation wRfx: NONREACTIVE

## 2020-03-20 LAB — TSH: TSH: 7.945 u[IU]/mL — ABNORMAL HIGH (ref 0.350–4.500)

## 2020-03-20 LAB — HEMOGLOBIN A1C
Hgb A1c MFr Bld: 6.4 % — ABNORMAL HIGH (ref 4.8–5.6)
Mean Plasma Glucose: 136.98 mg/dL

## 2020-03-20 LAB — OSMOLALITY, URINE: Osmolality, Ur: 352 mOsm/kg (ref 300–900)

## 2020-03-20 MED ORDER — TORSEMIDE 20 MG PO TABS
20.0000 mg | ORAL_TABLET | Freq: Every day | ORAL | Status: DC
  Administered 2020-03-20 – 2020-03-23 (×4): 20 mg via ORAL
  Filled 2020-03-20 (×5): qty 1

## 2020-03-20 MED ORDER — SODIUM BICARBONATE 650 MG PO TABS
650.0000 mg | ORAL_TABLET | Freq: Three times a day (TID) | ORAL | Status: DC
  Administered 2020-03-20 – 2020-03-23 (×10): 650 mg via ORAL
  Filled 2020-03-20 (×11): qty 1

## 2020-03-20 NOTE — ED Notes (Signed)
Pt ambulatory to bathroom with minimal assistance  

## 2020-03-20 NOTE — Consult Note (Signed)
Nephrology Consult   Requesting provider: Antonieta Pert, MD Reason for consult: aki   Assessment/Recommendations: Dylan Jefferson is a/an 69 y.o. male with a past medical history notable for AKI    AKI on CKD3: -Baseline creatinine around 1.9-2.1.  CKD etiology likely related to arteriosclerosis along with the fact that he has a solitary kidney from a right nephrectomy -Etiology of his AKI may be related to decreased effective arterial blood volume in the setting of cardiorenal syndrome?  He did have an episode of hypotension on admission unsure if this is a false reading -Urine protein creatinine ratio is 0.5 g -Repeat urinalysis with sediment exam -Renal ultrasound without hydronephrosis does have a nonobstructing kidney stone -Currently no indication for HD -Continue to monitor daily Cr, Dose meds for GFR<15 -Monitor Daily I/Os, Daily weight  -Maintain MAP>65 for optimal renal perfusion.  -Agree with holding ACE-I, avoid further nephrotoxins including NSAIDS, Morphine.  Unless absolutely necessary, avoid CT with contrast and/or MRI with gadolinium.     Hypervolemia -echo, r/o HFrEF -will change to torsemide 20mg  daily to start with, will likely   Metastatic RCC, h/o right nephrectomy -agree with holding sunitinib especially if concern for CHF and increase in serum Cr -onc on board  Anion gap metabolic acidosis -Likely secondary to diarrhea -If worsening bicarb and recommend checking a blood gas -agree with sodium bicarbonate tablets for now given worsening bicarb expect this to get better as his diarrhea improves  Hypertension, now with relative hypotension: Will d/c norvasc for now  Leukocytosis/neutropenia -on precautions, mgmt per primary  Macrocytic Anemia: -check b12, folate -Transfuse for Hgb<7 g/dL  Diabetes Mellitus -mgmt per primary service  Diarrhea -would recommend ruling out cdiff for now   [facility] 03/20/2020 12:13  PM   _____________________________________________________________________________________   History of Present Illness: Dylan Jefferson is a/an 69 y.o. male with a past medical history of CKD 3, metastatic renal cell carcinoma with right nephrectomy, hypothyroidism, hypertension, diabetes mellitus, history of thyroid cancer, hypertension, hyperlipidemia, h/o of Whipples, diabetes mellitus type 2 (or 1 as per patient?), depression, arthritis who presents to [facility] with swelling in his legs since May and chest pain.  He is also noted to have a low albumin as well and his PCP was managing that.  He was open to have an appointment with nephrology and had an echo as an outpatient has been waiting on that.  She also been gaining substantial mount of weight and has been on Lasix.  He also started having shortness of breath along with dyspnea on exertion 2 days prior to admission.  Regards to his chest pain he does report having a rib crack few months ago.  Has been having diarrhea for about 3 days. Does report orthopnea as well. Diarrhea is still ongoing. Otherwise denies any fevers, chills, chest pain, dizziness, decreased urinary frequency. No NSAID use.   Medications:  Current Facility-Administered Medications  Medication Dose Route Frequency Provider Last Rate Last Admin   acidophilus (RISAQUAD) capsule 1 capsule  1 capsule Oral Daily Kc, Ramesh, MD   1 capsule at 03/20/20 1039   albuterol (PROVENTIL) (2.5 MG/3ML) 0.083% nebulizer solution 2.5 mg  2.5 mg Nebulization Q2H PRN Kc, Ramesh, MD       amLODipine (NORVASC) tablet 10 mg  10 mg Oral Daily Kc, Ramesh, MD   10 mg at 03/20/20 1039   ascorbic acid (VITAMIN C) tablet 1,000 mg  1,000 mg Oral Daily Antonieta Pert, MD   1,000  mg at 03/20/20 1039   aspirin EC tablet 81 mg  81 mg Oral Daily Kc, Ramesh, MD   81 mg at 03/20/20 1039   atenolol (TENORMIN) tablet 100 mg  100 mg Oral Daily Kc, Ramesh, MD   100 mg at 03/20/20 1040   calcium-vitamin D  (OSCAL WITH D) 500-200 MG-UNIT per tablet 1 tablet  1 tablet Oral BID Lenis Noon, [facility]   1 tablet at 03/20/20 1040   diclofenac Sodium (VOLTAREN) 1 % topical gel 2 g  2 g Topical TID PRN Kc, Maren Beach, MD       dorzolamide-timolol (COSOPT) 22.3-6.8 MG/ML ophthalmic solution 1 drop  1 drop Both Eyes BID Kc, Ramesh, MD   1 drop at 03/20/20 1044   famotidine (PEPCID) tablet 20 mg  20 mg Oral Daily Kc, Ramesh, MD   20 mg at 03/20/20 1042   ferrous sulfate tablet 325 mg  325 mg Oral BID WC Kc, Ramesh, MD   325 mg at 03/20/20 0802   furosemide (LASIX) tablet 40 mg  40 mg Oral q morning - 10a Kc, Ramesh, MD   40 mg at 03/20/20 1041   heparin injection 5,000 Units  5,000 Units Subcutaneous Q8H Kc, Ramesh, MD   5,000 Units at 03/20/20 0539   insulin aspart (novoLOG) injection 0-9 Units  0-9 Units Subcutaneous TID WC Kc, Ramesh, MD       insulin detemir (LEVEMIR) injection 4 Units  4 Units Subcutaneous Daily Kc, Ramesh, MD       latanoprost (XALATAN) 0.005 % ophthalmic solution 1 drop  1 drop Both Eyes QHS Kc, Ramesh, MD       levothyroxine (SYNTHROID) tablet 300 mcg  300 mcg Oral QAC breakfast Kc, Ramesh, MD   300 mcg at 03/20/20 0540   lipase/protease/amylase (CREON) capsule 12,000 Units  12,000 Units Oral TID with meals Kc, Maren Beach, MD   12,000 Units at 03/20/20 0802   minoxidil (LONITEN) tablet 10 mg  10 mg Oral BID Kc, Maren Beach, MD   10 mg at 03/20/20 1040   multivitamin with minerals tablet 1 tablet  1 tablet Oral Daily Kc, Ramesh, MD   1 tablet at 03/20/20 1039   ondansetron (ZOFRAN) tablet 4 mg  4 mg Oral Q6H PRN Kc, Ramesh, MD       Or   ondansetron (ZOFRAN) injection 4 mg  4 mg Intravenous Q6H PRN Kc, Ramesh, MD       pravastatin (PRAVACHOL) tablet 40 mg  40 mg Oral q1800 Kc, Ramesh, MD   40 mg at 03/19/20 2031   sodium bicarbonate tablet 650 mg  650 mg Oral TID Antonieta Pert, MD       traMADol (ULTRAM) tablet 50 mg  50 mg Oral Q12H PRN Antonieta Pert, MD   50 mg at 03/19/20 2032    vitamin B-12 (CYANOCOBALAMIN) tablet 2,500 mcg  2,500 mcg Oral Daily Kc, Ramesh, MD   2,500 mcg at 03/20/20 1040   Current Outpatient Medications  Medication Sig Dispense Refill   amLODipine (NORVASC) 5 MG tablet Take 10 mg by mouth 2 (two) times daily.      Ascorbic Acid (VITAMIN C) 1000 MG tablet Take 1,000 mg by mouth daily.     aspirin 81 MG tablet Take 81 mg by mouth daily.      atenolol (TENORMIN) 100 MG tablet Take 100 mg by mouth daily.      augmented betamethasone dipropionate (DIPROLENE-AF) 0.05 % cream Apply 1 application topically daily as needed (rash).  0   calcium citrate-vitamin D (CITRACAL+D) 315-200 MG-UNIT per tablet Take 2 tablets by mouth 2 (two) times daily.      Cyanocobalamin (VITAMIN B-12) 2500 MCG SUBL Take 2,500 mcg by mouth daily.      diclofenac Sodium (VOLTAREN) 1 % GEL Apply 3 g topically 3 (three) times daily as needed (joint pain).      docusate sodium (COLACE) 100 MG capsule Take 100 mg by mouth 2 (two) times daily as needed for mild constipation.      dorzolamide-timolol (COSOPT) 22.3-6.8 MG/ML ophthalmic solution Place 1 drop into both eyes 2 (two) times daily.   1   famotidine (PEPCID) 20 MG tablet Take 20 mg by mouth 2 (two) times daily.     ferrous sulfate 325 (65 FE) MG tablet Take 325 mg by mouth 2 (two) times daily with a meal.      furosemide (LASIX) 40 MG tablet Take 40 mg by mouth every morning.     Glucosamine-MSM-Hyaluronic Acd (JOINT HEALTH) 750-375-30 MG TABS Take 2 tablets by mouth daily.     insulin lispro (HUMALOG) 100 UNIT/ML injection Inject 2-4 Units into the skin 3 (three) times daily before meals. Per sliding scale     Iron-Vitamins (GERITOL COMPLETE) TABS Take 1 tablet by mouth daily.     Lactobacillus (ACIDOPHILUS) CAPS capsule Take 1 capsule by mouth daily.     latanoprost (XALATAN) 0.005 % ophthalmic solution Place 1 drop into both eyes at bedtime.   1   LEVEMIR 100 UNIT/ML injection Inject 4 Units into the skin in  the morning.   1   levothyroxine (SYNTHROID, LEVOTHROID) 300 MCG tablet Take 300 mcg by mouth daily before breakfast.      lovastatin (MEVACOR) 40 MG tablet Take 40 mg by mouth at bedtime.      minoxidil (LONITEN) 10 MG tablet Take 10 mg by mouth 2 (two) times daily.      Omega-3 Fatty Acids (FISH OIL) 1200 MG CAPS Take 1 capsule by mouth daily.     Pancrelipase, Lip-Prot-Amyl, (CREON) 6000 UNITS CPEP Take 4 capsules by mouth 3 (three) times daily.     potassium chloride SA (KLOR-CON) 20 MEQ tablet Take 20 mEq by mouth every evening.      sulindac (CLINORIL) 150 MG tablet Take 150 mg by mouth daily as needed (pain).      amLODipine (NORVASC) 10 MG tablet take 1 tablet by mouth once daily (Patient not taking: Reported on 03/31/2018) 90 tablet 3   SUTENT 37.5 MG capsule TAKE 1 CAPSULE (37.5 MG) BY MOUTH ONCE DAILY FOR 2 WEEKS ON, 1 WEEK OFF (Patient not taking: Reported on 03/19/2020) 14 capsule 0     ALLERGIES Patient has no known allergies.  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   Sero Negative, Ultrasound positive synovitis    Kidney disease    Nephrolithiasis    Osteoarthritis of both hands 05/28/2016   Osteoarthritis of both knees 05/28/2016   Peripheral vascular disorder ([place]) 05/28/2016   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   Rheumatoid arthritis ([place])    Thyroid cancer ([place]) 05/28/2016     SOCIAL HISTORY Social History   Socioeconomic  History   Marital status: Married    Spouse name: Not on file   Number of children: 1   Years of education: Not on file   Highest education level: Not on file  Occupational History   Occupation: Disabled   Tobacco Use   Smoking status: Former Smoker    Years: 1.50   Smokeless tobacco: Never Used  Scientific laboratory technician Use: Never used  Substance and Sexual Activity   Alcohol use: Yes    Comment: occasional beer   Drug use: No   Sexual activity: Not on file  Other Topics Concern   Not on file  Social History Narrative   Not on file   Social Determinants of Health   Financial Resource Strain:    Difficulty of Paying Living Expenses:   Food Insecurity:    Worried About Charity fundraiser in the Last Year:    Arboriculturist in the Last Year:   Transportation Needs:    Film/video editor (Medical):    Lack of Transportation (Non-Medical):   Physical Activity:    Days of Exercise per Week:    Minutes of Exercise per Session:   Stress:    Feeling of Stress :   Social Connections:    Frequency of Communication with Friends and Family:    Frequency of Social Gatherings with Friends and Family:    Attends Religious Services:    Active Member of Clubs or Organizations:    Attends Music therapist:    Marital Status:   Intimate Partner Violence:    Fear of Current or Ex-Partner:    Emotionally Abused:    Physically Abused:    Sexually Abused:      FAMILY HISTORY Family History  Problem Relation Age of Onset   Bleeding Disorder Mother        Unknown what type   Osteoporosis Mother    Lymphoma Father        Hodgkin's lymphoma   Colon cancer Neg Hx      Review of Systems: 12 systems reviewed Otherwise as per HPI, all other systems reviewed and negative  Physical Exam: Vitals:   03/20/20 0730 03/20/20 1038  BP: 110/63 110/66  Pulse: 63 72  Resp: 17 16  Temp:    SpO2: 95% 96%   No intake/output data recorded. No intake or output data in the 24 hours ending 03/20/20 1213 General: well-appearing, no acute distress, sitting up in bed HEENT: anicteric sclera, oropharynx clear without lesions CV: regular rate, normal  rhythm, no murmurs, no gallops, no rubs Lungs: diminished air entry bibasilar, no overt w/r/r/c, unable to complete some full sentences, bl chest expansion Abd: soft, non-tender, non-distended Skin: no visible lesions or rashes Psych: alert, engaged, appropriate mood and affect Musculoskeletal: 3+ pitting edema up to upper thighs Neuro: normal speech, no gross focal deficits   Test Results Reviewed Lab Results  Component Value Date   NA 144 03/20/2020   K 3.8 03/20/2020   CL 120 (H) 03/20/2020   CO2 14 (L) 03/20/2020   BUN 41 (H) 03/20/2020   CREATININE 2.36 (H) 03/20/2020   CALCIUM 6.7 (L) 03/20/2020   ALBUMIN 2.4 (L) 03/19/2020   PHOS 1.8 (L) 10/29/2010     I have reviewed all relevant outside healthcare records related to the patient's kidney injury.

## 2020-03-20 NOTE — Progress Notes (Signed)
PROGRESS NOTE    Dylan Jefferson  FFM:[id number] DOB: [birth year] DOA: 03/19/2020 PCP: [name], MD   Chief Complaint  Patient presents with  . Weakness   Brief Narrative: 69 y.o. male with medical history significant for metastatic renal carcinoma with right nephrectomy, hypothyroidism, hypertension, diabetes mellitus, renal insufficiency, history of thyroid cancer, hypertension, hyperlipidemia, diabetes mellitus, depression, arthritis comes to the ED for evaluation of chest pain not feeling well. He reports he started to have swelling in legs since end of May and was seeing pcp and was told to increase his protein for low albumin. He was supposed to have nephrology eval and echo as outpatient. He had gained substantial weight, he was placed on lasix 40 mg since July 26 ( stopped his hctz and increased amlodipine to 10 mg bid and he has been peeing and lasix has  helped some. He started having shortness of breath DOE since last 2 days. He is having pain all over and on rt mid chest where he has rib " crack" for few months. He was seen in on 8/14 and back again due to same issues. He is having diarrehea with greenish loose stool, since 3 days and is on imodium. Patient otherwise denies any nausea,vomiting,fever, chills, headache, focal weakness, numbness tingling, speech difficulties.   ED Course:Vitals are stable afebrile, heart rate in 60s to 70s not hypoxic.  Work-up shows slightly positive troponin but borderline and elevated BNP in 390s, elevated creatinine,, blood sugar in 60.  Due to uncontrolled pain and given that patient has had recurrent elevated admission was requested to have him undergo further work-up for his renal dysfunction and possible CHF and fluid overload issues  Subjective: Patient feels much better this morning, complains of bilateral lower extremity pain chest pain is better.  On room air.  Still has significant edematous leg.  Assessment & Plan:  Fluid overload  generalized swelling, BNP slightly elevated in 300s.  Echo pending to rule out congestive heart failure, mild proteinuria but with elevated AKI CKD consulting nephrology to manage his fluid status in the setting of AKI solitary kidney.  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  He is not hypoxic not tachycardic, doubt PE.  Pain is controlled on tramadol.   AKI on CKD stage IIIb; recent creat 3.82>3.18>  2.7. Creatinine has been high ranging 1.4-1.8 in 2020 and since January of this year he has been ranging in 1.8-2.1.  He does have outpatient nephrology evaluation scheduled but has not gotten a chance to see yet.    Unclear etiology.  Has significant leg edema has been taking oral Lasix without much improvement.  Renal ultrasound no hydronephrosis on the left, status post right nephrectomy.  Consulted nephrology this morning.  Metabolic acidosis 2/2 aki/ckd- bicarb at 14.  Add p.o. bicarb.  Metastatic renal cell carcinoma history of right nephrectomy.  He did have his metastatic disease is, he was Sutenet po at home and currently on hold by his oncologist till his renal/cardiac issues sorted out  Leukopenia WBC count 1600.  Ila 500 Keep neutropenic precaution,Monitor CBC pendind today.Dr Irene Limbo consulted.  Hypothyroidism-checked tsh up at 7.9. Cont home Synthroid if still high in 3 wk will need to up his synthroid.  Diabetes mellitus with hypoglycemia in 60s in the ED 8/16: Blood sugar overall stable-low. Hold lantus and cont ssi.Patient reports that he does drop sugar by 100 during night ( he usually has an alarm to wake up during the night to check his sugar/he is to have hypoglycemia alarm that would wake him up  but is workign his pcp to refill his sensors) keep q 4-hour's cbg check  HTN : On atenolol, amlodipine 10 mg twice daily and minoxidil.  Keep his home meds but changed amlodipine to daily, wonder if swelling is associated/worsened by CCB.  DVT prophylaxis: heparin injection 5,000 Units Start: 03/19/20 2200 SCDs Start: 03/19/20 1811 Code Status:   Code Status: Full Code Family Communication: plan of care discussed with patient at bedside.  Status is: Observation Admitted under observation but patient continues to remain hospitalized for ongoing work-up of his renal dysfunction fluid overload/fluid management nephrology consultation, cardiac work up.  Dispo: The patient is from: Home              Anticipated d/c is to: Home obtain PT OT evaluation for disposition.              Anticipated d/c date is: 2 days              Patient currently is not medically stable to d/c.   Nutrition: Diet Order            Diet 2 gram sodium Room service appropriate? Yes; Fluid consistency: Thin  Diet effective now                Body mass index is 29.42 kg/m.  Consultants:see note  Procedures:see note Microbiology:see note Blood Culture No results found for: SDES, SPECREQUEST, CULT, REPTSTATUS  Other culture-see note  Medications: Scheduled Meds: . acidophilus  1 capsule Oral Daily  . amLODipine  10 mg Oral Daily  . vitamin C  1,000 mg Oral Daily  . aspirin EC  81 mg Oral Daily  . atenolol  100 mg Oral Daily  . calcium-vitamin D  1 tablet Oral BID  . dorzolamide-timolol  1 drop Both Eyes BID  . famotidine  20 mg Oral Daily  . ferrous sulfate  325 mg Oral BID WC  . furosemide  40 mg Oral q morning - 10a  . heparin  5,000 Units Subcutaneous Q8H  . insulin aspart  0-9 Units Subcutaneous TID WC  . insulin detemir  4 Units Subcutaneous Daily  . latanoprost  1 drop Both Eyes QHS  . levothyroxine  300 mcg Oral QAC breakfast  . lipase/protease/amylase  12,000 Units Oral TID with meals  .  minoxidil  10 mg Oral BID  . multivitamin with minerals  1 tablet Oral Daily  . pravastatin  40 mg Oral q1800  . vitamin B-12  2,500 mcg Oral Daily   Continuous Infusions:  Antimicrobials: Anti-infectives (From admission, onward)   None       Objective: Vitals: Today's Vitals   03/20/20 0300 03/20/20 0330 03/20/20 0400 03/20/20 0543  BP: (!) 100/48 (!) 105/53 (!) 106/52 115/61  Pulse: 66 66 68 72  Resp: 16 16 16 18   Temp:    99.1 F (37.3 C)  TempSrc:    Oral  SpO2: 93% 94% 96% 96%  Weight:      Height:      PainSc:    0-No pain   No intake or output data in the 24 hours ending 03/20/20 0753 Filed Weights   03/19/20  1231  Weight: 93 kg   Weight change:    Intake/Output from previous day: No intake/output data recorded. Intake/Output this shift: No intake/output data recorded.  Examination:  General exam: AAOx3 ,NAD, weak appearing. HEENT:Oral mucosa moist, Ear/Nose WNL grossly,dentition normal. Respiratory system: bilaterally diminished,no wheezing or crackles,no use of accessory muscle, non tender. Cardiovascular system: S1 & S2 +, regular, No JVD. Gastrointestinal system: Abdomen soft, NT,ND, BS+. Nervous System:Alert, awake, moving extremities and grossly nonfocal Extremities: b/l pitting edema, distal peripheral pulses palpable.  Skin: No rashes,no icterus. MSK: Normal muscle bulk,tone, power  Data Reviewed: I have personally reviewed following labs and imaging studies CBC: Recent Labs  Lab 03/15/20 0911 03/17/20 2129 03/19/20 1313  WBC 4.2 1.6* 1.7*  NEUTROABS 2.2  --  0.5*  HGB 12.0* 11.6* 11.6*  HCT 35.6* 34.4* 34.9*  MCV 115.2* 117.4* 116.7*  PLT 261 226 063   Basic Metabolic Panel: Recent Labs  Lab 03/15/20 0911 03/17/20 2129 03/19/20 1313 03/19/20 2003  NA 137 136 138  --   K 4.9 5.0 5.0  --   CL 112* 113* 111  --   CO2 19* 17* 20*  --   GLUCOSE 240* 94 105*  --   BUN 32* 43* 45*  --   CREATININE 2.85* 3.18* 2.82* 2.76*    CALCIUM 8.1* 7.8* 7.9*  --    GFR: Estimated Creatinine Clearance: 28.5 mL/min (A) (by C-G formula based on SCr of 2.76 mg/dL (H)). Liver Function Tests: Recent Labs  Lab 03/15/20 0911 03/19/20 1313  AST 40 38  ALT 33 23  ALKPHOS 135* 65  BILITOT 0.9 1.2  PROT 4.8* 4.8*  ALBUMIN 2.5* 2.4*   No results for input(s): LIPASE, AMYLASE in the last 168 hours. No results for input(s): AMMONIA in the last 168 hours. Coagulation Profile: No results for input(s): INR, PROTIME in the last 168 hours. Cardiac Enzymes: No results for input(s): CKTOTAL, CKMB, CKMBINDEX, TROPONINI in the last 168 hours. BNP (last 3 results) No results for input(s): PROBNP in the last 8760 hours. HbA1C: Recent Labs    03/19/20 2003  HGBA1C 6.4*   CBG: Recent Labs  Lab 03/19/20 1552 03/19/20 1634 03/19/20 2020 03/20/20 0151 03/20/20 0531  GLUCAP 60* 107* 132* 70 87   Lipid Profile: No results for input(s): CHOL, HDL, LDLCALC, TRIG, CHOLHDL, LDLDIRECT in the last 72 hours. Thyroid Function Tests: No results for input(s): TSH, T4TOTAL, FREET4, T3FREE, THYROIDAB in the last 72 hours. Anemia Panel: No results for input(s): VITAMINB12, FOLATE, FERRITIN, TIBC, IRON, RETICCTPCT in the last 72 hours. Sepsis Labs: No results for input(s): PROCALCITON, LATICACIDVEN in the last 168 hours.  Recent Results (from the past 240 hour(s))  SARS Coronavirus 2 by RT PCR (hospital order, performed in [facility] hospital lab) Nasopharyngeal Nasopharyngeal Swab     Status: None   Collection Time: 03/19/20  1:53 PM   Specimen: Nasopharyngeal Swab  Result Value Ref Range Status   SARS Coronavirus 2 NEGATIVE NEGATIVE Final    Comment: (NOTE) SARS-CoV-2 target nucleic acids are NOT DETECTED.  The SARS-CoV-2 RNA is generally detectable in upper and lower respiratory specimens during the acute phase of infection. The lowest concentration of SARS-CoV-2 viral copies this assay can detect is 250 copies / mL. A negative  result does not preclude SARS-CoV-2 infection and should not be used as the sole basis for treatment or other patient management decisions.  A negative result may occur with improper specimen collection / handling, submission of specimen other  than nasopharyngeal swab, presence of viral mutation(s) within the areas targeted by this assay, and inadequate number of viral copies (<250 copies / mL). A negative result must be combined with clinical observations, patient history, and epidemiological information.  Fact Sheet for Patients:   StrictlyIdeas.no  Fact Sheet for Healthcare Providers: BankingDealers.co.za  This test is not yet approved or  cleared by the [place] FDA and has been authorized for detection and/or diagnosis of SARS-CoV-2 by FDA under an Emergency Use Authorization (EUA).  This EUA will remain in effect (meaning this test can be used) for the duration of the COVID-19 declaration under Section 564(b)(1) of the Act, 21 U.S.C. section 360bbb-3(b)(1), unless the authorization is terminated or revoked sooner.  Performed at [facility], [place] [address], [place], [place] [address]       Radiology Studies: DG Chest 2 View  Result Date: 03/19/2020 CLINICAL DATA:  69 year old male with history of chest pain and shortness of breath. EXAM: CHEST - 2 VIEW COMPARISON:  Chest x-ray 03/17/2020. FINDINGS: Linear areas of architectural distortion in the right mid to lower lung. Left lung is clear. No pleural effusions. No pneumothorax. No evidence of pulmonary edema. Heart size is normal. Upper mediastinal contours are within normal limits. IMPRESSION: 1. Linear areas of architectural distortion in the right mid to lower lung likely to reflect areas of evolving postradiation change. No radiographic evidence of acute cardiopulmonary disease. Electronically Signed   By: Vinnie Langton M.D.   On: 03/19/2020 14:02    US RENAL  Result Date: 03/19/2020 CLINICAL DATA:  69 year old male with acute renal insufficiency. Prior right nephrectomy. EXAM: RENAL / URINARY TRACT ULTRASOUND COMPLETE COMPARISON:  Renal ultrasound dated 12/17/2010. FINDINGS: Right Kidney: Nephrectomy. Left Kidney: Renal measurements: 11.6 x 7.0 x 8.3 cm = volume: 336 mL. Normal echogenicity. There is an 11 mm echogenic focus in the inferior pole of the left kidney, likely a nonobstructing calculus. No hydronephrosis. Bladder: Appears normal for degree of bladder distention. Other: Small free fluid noted within the pelvis. IMPRESSION: 1. Status post prior right nephrectomy. 2. A nonobstructing left renal inferior pole calculus. No hydronephrosis. 3. Small ascites. Electronically Signed   By: Anner Crete M.D.   On: 03/19/2020 18:46     LOS: 0 days   Antonieta Pert, MD [facility]  03/20/2020, 7:53 AM

## 2020-03-20 NOTE — ED Notes (Signed)
Pt resting in stretcher, offers no complaints. Pt SO at bedside. Echo at bedside.

## 2020-03-20 NOTE — ED Notes (Signed)
Pt aox4, states he is "feeling better" than before. CBG improving.

## 2020-03-20 NOTE — Progress Notes (Signed)
Hypoglycemic Event  CBG: 42  Treatment: 8 oz juice/soda  Symptoms: Pale  Follow-up CBG: Time:2115 CBG Result:67   Possible Reasons for Event: inadequate meal intake  Comments/MD notified:nurse driven hypoglycemic protocol initiated     Ivana Nicastro, Sherryll Burger

## 2020-03-20 NOTE — ED Notes (Signed)
Pt ambulatory to restroom with significant other.

## 2020-03-20 NOTE — Progress Notes (Signed)
  Echocardiogram 2D Echocardiogram has been performed.  Bobbye Charleston 03/20/2020, 3:08 PM

## 2020-03-20 NOTE — ED Notes (Signed)
CBG 66, pt given orange juice and breakfast at this time. Will continue to monitor and recheck CBG.

## 2020-03-21 DIAGNOSIS — Z20822 Contact with and (suspected) exposure to covid-19: Secondary | ICD-10-CM | POA: Diagnosis present

## 2020-03-21 DIAGNOSIS — Z905 Acquired absence of kidney: Secondary | ICD-10-CM | POA: Diagnosis not present

## 2020-03-21 DIAGNOSIS — I959 Hypotension, unspecified: Secondary | ICD-10-CM | POA: Diagnosis present

## 2020-03-21 DIAGNOSIS — N521 Erectile dysfunction due to diseases classified elsewhere: Secondary | ICD-10-CM | POA: Diagnosis present

## 2020-03-21 DIAGNOSIS — E11649 Type 2 diabetes mellitus with hypoglycemia without coma: Secondary | ICD-10-CM | POA: Diagnosis present

## 2020-03-21 DIAGNOSIS — Z87891 Personal history of nicotine dependence: Secondary | ICD-10-CM | POA: Diagnosis not present

## 2020-03-21 DIAGNOSIS — N289 Disorder of kidney and ureter, unspecified: Secondary | ICD-10-CM | POA: Diagnosis not present

## 2020-03-21 DIAGNOSIS — Z9842 Cataract extraction status, left eye: Secondary | ICD-10-CM | POA: Diagnosis not present

## 2020-03-21 DIAGNOSIS — I130 Hypertensive heart and chronic kidney disease with heart failure and stage 1 through stage 4 chronic kidney disease, or unspecified chronic kidney disease: Secondary | ICD-10-CM | POA: Diagnosis present

## 2020-03-21 DIAGNOSIS — Z85850 Personal history of malignant neoplasm of thyroid: Secondary | ICD-10-CM | POA: Diagnosis not present

## 2020-03-21 DIAGNOSIS — Z85528 Personal history of other malignant neoplasm of kidney: Secondary | ICD-10-CM | POA: Diagnosis not present

## 2020-03-21 DIAGNOSIS — Z794 Long term (current) use of insulin: Secondary | ICD-10-CM | POA: Diagnosis not present

## 2020-03-21 DIAGNOSIS — N1832 Chronic kidney disease, stage 3b: Secondary | ICD-10-CM | POA: Diagnosis present

## 2020-03-21 DIAGNOSIS — E785 Hyperlipidemia, unspecified: Secondary | ICD-10-CM | POA: Diagnosis present

## 2020-03-21 DIAGNOSIS — D631 Anemia in chronic kidney disease: Secondary | ICD-10-CM | POA: Diagnosis present

## 2020-03-21 DIAGNOSIS — N179 Acute kidney failure, unspecified: Secondary | ICD-10-CM | POA: Diagnosis present

## 2020-03-21 DIAGNOSIS — E876 Hypokalemia: Secondary | ICD-10-CM | POA: Diagnosis present

## 2020-03-21 DIAGNOSIS — R197 Diarrhea, unspecified: Secondary | ICD-10-CM | POA: Diagnosis present

## 2020-03-21 DIAGNOSIS — E1122 Type 2 diabetes mellitus with diabetic chronic kidney disease: Secondary | ICD-10-CM | POA: Diagnosis present

## 2020-03-21 DIAGNOSIS — E872 Acidosis: Secondary | ICD-10-CM | POA: Diagnosis present

## 2020-03-21 DIAGNOSIS — I7090 Unspecified atherosclerosis: Secondary | ICD-10-CM | POA: Diagnosis present

## 2020-03-21 DIAGNOSIS — R601 Generalized edema: Secondary | ICD-10-CM | POA: Diagnosis present

## 2020-03-21 DIAGNOSIS — Z9049 Acquired absence of other specified parts of digestive tract: Secondary | ICD-10-CM | POA: Diagnosis not present

## 2020-03-21 DIAGNOSIS — C799 Secondary malignant neoplasm of unspecified site: Secondary | ICD-10-CM | POA: Diagnosis present

## 2020-03-21 DIAGNOSIS — Z9841 Cataract extraction status, right eye: Secondary | ICD-10-CM | POA: Diagnosis not present

## 2020-03-21 DIAGNOSIS — Z807 Family history of other malignant neoplasms of lymphoid, hematopoietic and related tissues: Secondary | ICD-10-CM | POA: Diagnosis not present

## 2020-03-21 DIAGNOSIS — I10 Essential (primary) hypertension: Secondary | ICD-10-CM | POA: Diagnosis not present

## 2020-03-21 DIAGNOSIS — E8770 Fluid overload, unspecified: Secondary | ICD-10-CM | POA: Diagnosis not present

## 2020-03-21 LAB — URINALYSIS, ROUTINE W REFLEX MICROSCOPIC
Bacteria, UA: NONE SEEN
Bilirubin Urine: NEGATIVE
Glucose, UA: NEGATIVE mg/dL
Hgb urine dipstick: NEGATIVE
Ketones, ur: NEGATIVE mg/dL
Leukocytes,Ua: NEGATIVE
Nitrite: NEGATIVE
Protein, ur: 30 mg/dL — AB
Specific Gravity, Urine: 1.01 (ref 1.005–1.030)
pH: 5 (ref 5.0–8.0)

## 2020-03-21 LAB — CBC WITH DIFFERENTIAL/PLATELET
Abs Immature Granulocytes: 0.03 10*3/uL (ref 0.00–0.07)
Basophils Absolute: 0 10*3/uL (ref 0.0–0.1)
Basophils Relative: 0 %
Eosinophils Absolute: 0.3 10*3/uL (ref 0.0–0.5)
Eosinophils Relative: 6 %
HCT: 29.7 % — ABNORMAL LOW (ref 39.0–52.0)
Hemoglobin: 9.8 g/dL — ABNORMAL LOW (ref 13.0–17.0)
Immature Granulocytes: 1 %
Lymphocytes Relative: 29 %
Lymphs Abs: 1.5 10*3/uL (ref 0.7–4.0)
MCH: 39 pg — ABNORMAL HIGH (ref 26.0–34.0)
MCHC: 33 g/dL (ref 30.0–36.0)
MCV: 118.3 fL — ABNORMAL HIGH (ref 80.0–100.0)
Monocytes Absolute: 1.3 10*3/uL — ABNORMAL HIGH (ref 0.1–1.0)
Monocytes Relative: 25 %
Neutro Abs: 2 10*3/uL (ref 1.7–7.7)
Neutrophils Relative %: 39 %
Platelets: 209 10*3/uL (ref 150–400)
RBC: 2.51 MIL/uL — ABNORMAL LOW (ref 4.22–5.81)
RDW: 15.9 % — ABNORMAL HIGH (ref 11.5–15.5)
WBC: 5 10*3/uL (ref 4.0–10.5)
nRBC: 0 % (ref 0.0–0.2)

## 2020-03-21 LAB — BASIC METABOLIC PANEL
Anion gap: 9 (ref 5–15)
BUN: 45 mg/dL — ABNORMAL HIGH (ref 8–23)
CO2: 16 mmol/L — ABNORMAL LOW (ref 22–32)
Calcium: 7.7 mg/dL — ABNORMAL LOW (ref 8.9–10.3)
Chloride: 115 mmol/L — ABNORMAL HIGH (ref 98–111)
Creatinine, Ser: 3.14 mg/dL — ABNORMAL HIGH (ref 0.61–1.24)
GFR calc Af Amer: 22 mL/min — ABNORMAL LOW (ref >60)
GFR calc non Af Amer: 19 mL/min — ABNORMAL LOW (ref >60)
Glucose, Bld: 76 mg/dL (ref 70–99)
Potassium: 3.5 mmol/L (ref 3.5–5.1)
Sodium: 140 mmol/L (ref 135–145)

## 2020-03-21 LAB — GLUCOSE, CAPILLARY
Glucose-Capillary: 171 mg/dL — ABNORMAL HIGH (ref 70–99)
Glucose-Capillary: 184 mg/dL — ABNORMAL HIGH (ref 70–99)
Glucose-Capillary: 198 mg/dL — ABNORMAL HIGH (ref 70–99)
Glucose-Capillary: 58 mg/dL — ABNORMAL LOW (ref 70–99)
Glucose-Capillary: 62 mg/dL — ABNORMAL LOW (ref 70–99)
Glucose-Capillary: 76 mg/dL (ref 70–99)
Glucose-Capillary: 89 mg/dL (ref 70–99)
Glucose-Capillary: 95 mg/dL (ref 70–99)

## 2020-03-21 LAB — C DIFFICILE QUICK SCREEN W PCR REFLEX
C Diff antigen: NEGATIVE
C Diff interpretation: NOT DETECTED
C Diff toxin: NEGATIVE

## 2020-03-21 MED ORDER — KATE FARMS STANDARD 1.4 PO LIQD
325.0000 mL | Freq: Every day | ORAL | Status: DC
  Administered 2020-03-21 – 2020-03-23 (×3): 325 mL via ORAL
  Filled 2020-03-21 (×3): qty 325

## 2020-03-21 MED ORDER — PROSOURCE PLUS PO LIQD
30.0000 mL | Freq: Two times a day (BID) | ORAL | Status: DC
  Administered 2020-03-22 – 2020-03-23 (×2): 30 mL via ORAL
  Filled 2020-03-21 (×2): qty 30

## 2020-03-21 MED ORDER — MINOXIDIL 2.5 MG PO TABS
5.0000 mg | ORAL_TABLET | Freq: Two times a day (BID) | ORAL | Status: DC
  Administered 2020-03-21 – 2020-03-22 (×2): 5 mg via ORAL
  Filled 2020-03-21 (×2): qty 2

## 2020-03-21 MED ORDER — GLUCOSE 40 % PO GEL
1.0000 | ORAL | Status: AC
  Administered 2020-03-21: 37.5 g via ORAL
  Filled 2020-03-21: qty 1

## 2020-03-21 NOTE — Progress Notes (Signed)
[facility] Progress Note    Assessment/ Plan:   AKI on CKD3: -Baseline creatinine around 1.9-2.1.  CKD etiology likely related to arteriosclerosis along with the fact that he has a solitary kidney from a right nephrectomy -Etiology of his AKI may be related to decreased effective arterial blood volume in the setting of cardiorenal syndrome?  seems that he may have a perfusion issue in the setting of relative hypotension. Diarrhea is also contributing to a pre-renal picture which I suspect is the cause of his Cr going up today to 3.1. Regardless, he needs to offload volume so will continue him on a low dose torsemide -Urine protein creatinine ratio is 0.5 g -Repeat urinalysis with sediment exam, reordered -Renal ultrasound without hydronephrosis does have a nonobstructing kidney stone -Currently no indication for HD -Continue to monitor daily Cr, Dose meds for GFR<15 -Monitor Daily I/Os, Daily weight  -Maintain MAP>65 for optimal renal perfusion.  -Agree with holding ACE-I, avoid further nephrotoxins including NSAIDS, Morphine.  Unless absolutely necessary, avoid CT with contrast and/or MRI with gadolinium.     Hypervolemia -echo pending, r/o HFrEF -torsemide 20mg  daily -sodium restriction  Metastatic RCC, h/o right nephrectomy -agree with holding sunitinib especially if concern for CHF and increase in serum Cr -onc on board  Anion gap metabolic acidosis, improved -Likely secondary to diarrhea -If worsening bicarb and recommend checking a blood gas -agree with sodium bicarbonate tablets for now  Hypoalbuminemia -encouraged protein intake, encouraged supplementation with ensure vs boost  Hypertension, now with relative hypotension: d/c'ed norvasc for now. Cut back on minoxidil to 5mg  bid (ideally want to get him off of this)  Leukopenia/neutropenia -total wbc improved, anc improved  Macrocytic Anemia: -check b12, folate -Transfuse for Hgb<7  g/dL  Diabetes Mellitus -mgmt per primary service  Diarrhea -ruling out cdiff  Gean Quint, MD [place] 03/21/2020, 3:33 PM   Subjective:   Episodic hypoglycemia. Patient reports that his breathing is better and he feels like his legs aren't as heavy. Wants a better diet to prevent hypoglycemia.   Objective:   BP (!) 115/54 (BP Location: Left Arm)    Pulse 62    Temp 98.2 F (36.8 C) (Oral)    Resp 18    Ht 5\' 10"  (1.778 m)    Wt 93 kg    SpO2 96%    BMI 29.42 kg/m   Intake/Output Summary (Last 24 hours) at 03/21/2020 1533 Last data filed at 03/21/2020 1440 Gross per 24 hour  Intake 1100 ml  Output 150 ml  Net 950 ml   Weight change:   Physical Exam: Gen:nad, sitting up in bed CVS:s1s2, rrr, no m/r/g Resp:cta bl, no w/r/r/c, unlabored, bl chest expansion WUJ:WJXB, nt/nd Ext:3+ pitting edema bl le's up to lower thighs Neuro: speech clear and coherent, moves all extremities spontaneously  Imaging: US RENAL  Result Date: 03/19/2020 CLINICAL DATA:  69 year old male with acute renal insufficiency. Prior right nephrectomy. EXAM: RENAL / URINARY TRACT ULTRASOUND COMPLETE COMPARISON:  Renal ultrasound dated 12/17/2010. FINDINGS: Right Kidney: Nephrectomy. Left Kidney: Renal measurements: 11.6 x 7.0 x 8.3 cm = volume: 336 mL. Normal echogenicity. There is an 11 mm echogenic focus in the inferior pole of the left kidney, likely a nonobstructing calculus. No hydronephrosis. Bladder: Appears normal for degree of bladder distention. Other: Small free fluid noted within the pelvis. IMPRESSION: 1. Status post prior right nephrectomy. 2. A nonobstructing left renal inferior pole calculus. No hydronephrosis. 3. Small ascites. Electronically Signed   By:  Anner Crete M.D.   On: 03/19/2020 18:46   ECHOCARDIOGRAM COMPLETE  Result Date: 03/20/2020    ECHOCARDIOGRAM REPORT   Patient Name:   Dylan Jefferson Date of Exam: 03/20/2020 Medical Rec #:  [id number]         Height:        70.0 in Accession #:    [id number]        Weight:       205.0 lb Date of Birth:  1950-09-06         BSA:          2.109 m Patient Age:    69 years          BP:           122/68 mmHg Patient Gender: M                 HR:           68 bpm. Exam Location:  Inpatient Procedure: 2D Echo, Cardiac Doppler and Color Doppler Indications:    I50.9* Heart failure (unspecified)  History:        Patient has no prior history of Echocardiogram examinations.                 CHF, Signs/Symptoms:Chest Pain and Dyspnea; Risk                 Factors:Hypertension, Diabetes and Dyslipidemia. Edema.                 Chemotherapy. Metastatic cancer.  Sonographer:    Roseanna Rainbow RDCS Referring Phys: [id number] [facility]  Sonographer Comments: Technically difficult study due to poor echo windows. Limited mobility. IMPRESSIONS  1. Left ventricular ejection fraction, by estimation, is 60 to 65%. The left ventricle has normal function. The left ventricle has no regional wall motion abnormalities. There is moderate left ventricular hypertrophy. Left ventricular diastolic parameters are indeterminate.  2. Right ventricular systolic function is normal. The right ventricular size is normal. Tricuspid regurgitation signal is inadequate for assessing PA pressure.  3. The mitral valve is normal in structure. Trivial mitral valve regurgitation.  4. The aortic valve is tricuspid. Aortic valve regurgitation is not visualized. Mild aortic valve sclerosis is present, with no evidence of aortic valve stenosis. FINDINGS  Left Ventricle: Left ventricular ejection fraction, by estimation, is 60 to 65%. The left ventricle has normal function. The left ventricle has no regional wall motion abnormalities. The left ventricular internal cavity size was normal in size. There is  moderate left ventricular hypertrophy. Left ventricular diastolic parameters are indeterminate. Right Ventricle: The right ventricular size is normal. No increase in right ventricular wall  thickness. Right ventricular systolic function is normal. Tricuspid regurgitation signal is inadequate for assessing PA pressure. Left Atrium: Left atrial size was normal in size. Right Atrium: Right atrial size was normal in size. Pericardium: Trivial pericardial effusion is present. Mitral Valve: The mitral valve is normal in structure. Trivial mitral valve regurgitation. Tricuspid Valve: The tricuspid valve is normal in structure. Tricuspid valve regurgitation is trivial. Aortic Valve: The aortic valve is tricuspid. Aortic valve regurgitation is not visualized. Mild aortic valve sclerosis is present, with no evidence of aortic valve stenosis. Pulmonic Valve: The pulmonic valve was grossly normal. Pulmonic valve regurgitation is trivial. Aorta: The aortic root is normal in size and structure. IAS/Shunts: The interatrial septum was not well visualized.  LEFT VENTRICLE PLAX 2D LVIDd:         4.40 cm  Diastology LVIDs:         2.80 cm      LV e' lateral:   12.00 cm/s LV PW:         1.60 cm      LV E/e' lateral: 4.3 LV IVS:        1.70 cm      LV e' medial:    5.11 cm/s LVOT diam:     2.30 cm      LV E/e' medial:  10.2 LV SV:         112 LV SV Index:   53 LVOT Area:     4.15 cm  LV Volumes (MOD) LV vol d, MOD A2C: 102.0 ml LV vol d, MOD A4C: 86.4 ml LV vol s, MOD A2C: 33.0 ml LV vol s, MOD A4C: 38.4 ml LV SV MOD A2C:     69.0 ml LV SV MOD A4C:     86.4 ml LV SV MOD BP:      64.9 ml RIGHT VENTRICLE RV S prime:     16.30 cm/s TAPSE (M-mode): 2.4 cm LEFT ATRIUM             Index       RIGHT ATRIUM           Index LA diam:        4.20 cm 1.99 cm/m  RA Area:     12.40 cm LA Vol (A2C):   45.4 ml 21.52 ml/m RA Volume:   22.40 ml  10.62 ml/m LA Vol (A4C):   29.3 ml 13.89 ml/m LA Biplane Vol: 38.9 ml 18.44 ml/m  AORTIC VALVE LVOT Vmax:   145.00 cm/s LVOT Vmean:  92.400 cm/s LVOT VTI:    0.269 m  AORTA Ao Root diam: 3.50 cm MITRAL VALVE MV Area (PHT): 2.36 cm    SHUNTS MV Decel Time: 322 msec    Systemic VTI:  0.27  m MV E velocity: 52.00 cm/s  Systemic Diam: 2.30 cm MV A velocity: 55.85 cm/s MV E/A ratio:  0.93 Oswaldo Milian MD Electronically signed by Oswaldo Milian MD Signature Date/Time: 03/20/2020/9:00:18 PM    Final     Labs: BMET Recent Labs  Lab 03/15/20 0911 03/17/20 2129 03/19/20 1313 03/19/20 2003 03/20/20 1059 03/21/20 0527  NA 137 136 138  --  144 140  K 4.9 5.0 5.0  --  3.8 3.5  CL 112* 113* 111  --  120* 115*  CO2 19* 17* 20*  --  14* 16*  GLUCOSE 240* 94 105*  --  129* 76  BUN 32* 43* 45*  --  41* 45*  CREATININE 2.85* 3.18* 2.82* 2.76* 2.36* 3.14*  CALCIUM 8.1* 7.8* 7.9*  --  6.7* 7.7*   CBC Recent Labs  Lab 03/15/20 0911 03/15/20 0911 03/17/20 2129 03/19/20 1313 03/20/20 1642 03/21/20 0527  WBC 4.2   < > 1.6* 1.7* 4.0 5.0  NEUTROABS 2.2  --   --  0.5* 1.4* 2.0  HGB 12.0*   < > 11.6* 11.6* 10.1* 9.8*  HCT 35.6*   < > 34.4* 34.9* 29.9* 29.7*  MCV 115.2*   < > 117.4* 116.7* 118.2* 118.3*  PLT 261   < > 226 250 196 209   < > = values in this interval not displayed.    Medications:     (feeding supplement) PROSource Plus  30 mL Oral BID BM   acidophilus  1 capsule Oral Daily   vitamin C  1,000 mg  Oral Daily   aspirin EC  81 mg Oral Daily   atenolol  100 mg Oral Daily   calcium-vitamin D  1 tablet Oral BID   dorzolamide-timolol  1 drop Both Eyes BID   famotidine  20 mg Oral Daily   feeding supplement (K[employer] STANDARD 1.4)  325 mL Oral Daily   ferrous sulfate  325 mg Oral BID WC   heparin  5,000 Units Subcutaneous Q8H   insulin aspart  0-9 Units Subcutaneous TID WC   latanoprost  1 drop Both Eyes QHS   levothyroxine  300 mcg Oral QAC breakfast   lipase/protease/amylase  12,000 Units Oral TID with meals   minoxidil  5 mg Oral BID   multivitamin with minerals  1 tablet Oral Daily   pravastatin  40 mg Oral q1800   sodium bicarbonate  650 mg Oral TID   torsemide  20 mg Oral Daily   vitamin B-12  2,500 mcg Oral Daily

## 2020-03-21 NOTE — Progress Notes (Addendum)
Hypoglycemic Event  CBG: 62  Treatment: 1 tube glucose gel  Symptoms: Pale  Follow-up CBG: Time: 58 CBG Result:0515  Possible Reasons for Event: Unknown  Comments/MD notified:nurse driven protocol initiated   CBG 89 after administration of glucose gel   Dylan Jefferson

## 2020-03-21 NOTE — Evaluation (Signed)
Occupational Therapy Evaluation Patient Details Name: Dylan Jefferson MRN: [id number] DOB: 26-Dec-1950 Today's Date: 03/21/2020    History of Present Illness ADIEN KIMMEL is a 69 y.o. male with medical history significant for metastatic renal carcinoma with right nephrectomy, hypothyroidism, hypertension, diabetes mellitus, renal insufficiency, history of thyroid cancer, hypertension, hyperlipidemia, diabetes mellitus, depression, and arthritis. Patient presents with complaints of generalized weakness and diarrhea.   Clinical Impression   Mr. Viraaj Vorndran is a 69 year old man who demonstrates the ability to perform lower body dressing, toileting, standing grooming task and ability to ambulate in room without DME and/or complaints of fatigue. Patient exhibits the functional abilities to return home with spouse. No further OT needs at this time.    Follow Up Recommendations       Equipment Recommendations       Recommendations for Other Services       Precautions / Restrictions Precautions Precautions: None Restrictions Weight Bearing Restrictions: No      Mobility Bed Mobility Overal bed mobility: Modified Independent             General bed mobility comments: Increased time.  Transfers Overall transfer level: Needs assistance Equipment used: None Transfers: Sit to/from Stand Sit to Stand: Supervision         General transfer comment: Supervision for all ambulation in room without assistance.    Balance Overall balance assessment: No apparent balance deficits (not formally assessed)                                         ADL either performed or assessed with clinical judgement   ADL Overall ADL's : Independent                                       General ADL Comments: Patient demonstrated ability to donn lower body clothing, ambulate to bathroom, perform toileting and stand for grooming. Therapist in room  supervising but no assistance.     Vision   Vision Assessment?: No apparent visual deficits     Perception     Praxis      Pertinent Vitals/Pain Pain Assessment: No/denies pain     Hand Dominance Right   Extremity/Trunk Assessment Upper Extremity Assessment Upper Extremity Assessment: Overall WFL for tasks assessed   Lower Extremity Assessment Lower Extremity Assessment: Defer to PT evaluation   Cervical / Trunk Assessment Cervical / Trunk Assessment: Normal   Communication Communication Communication: No difficulties   Cognition Arousal/Alertness: Awake/alert Behavior During Therapy: WFL for tasks assessed/performed Overall Cognitive Status: Within Functional Limits for tasks assessed                                     General Comments       Exercises     Shoulder Instructions      Home Living Family/patient expects to be discharged to:: Private residence Living Arrangements: Spouse/significant other Available Help at Discharge: Available 24 hours/day Type of Home: House Home Access: Stairs to enter [employer] of Steps: 2 Entrance Stairs-Rails: None Home Layout: Other (Comment) (split level)     Bathroom Shower/Tub: Teacher, early years/pre: Standard     Home Equipment: Building services engineer  Comments: 4 steps up, 5 steps down.      Prior Functioning/Environment Level of Independence: Independent                 OT Problem List:        OT Treatment/Interventions:      OT Goals(Current goals can be found in the care plan section) Acute Rehab OT Goals OT Goal Formulation: All assessment and education complete, DC therapy  OT Frequency:     Barriers to D/C:            Co-evaluation              AM-PAC OT "6 Clicks" Daily Activity     Outcome Measure Help from another person eating meals?: None Help from another person taking care of personal grooming?: None Help from another person  toileting, which includes using toliet, bedpan, or urinal?: None Help from another person bathing (including washing, rinsing, drying)?: None Help from another person to put on and taking off regular upper body clothing?: None Help from another person to put on and taking off regular lower body clothing?: None 6 Click Score: 24   End of Session Nurse Communication: Mobility status  Activity Tolerance: Patient tolerated treatment well Patient left: in bed;with call bell/phone within reach  OT Visit Diagnosis: Muscle weakness (generalized) (M62.81)                Time: 5093-2671 OT Time Calculation (min): 18 min Charges:  OT General Charges $OT Visit: 1 Visit OT Evaluation $OT Eval Low Complexity: 1 Low  Parish Dubose, OTR/L [place]  Office [phone] Pager: [phone]   Lenward Chancellor 03/21/2020, 12:28 PM

## 2020-03-21 NOTE — Progress Notes (Signed)
PROGRESS NOTE    Dylan Jefferson  SVX:[id number] DOB: [birth year] DOA: 03/19/2020 PCP: [name], MD   Chief Complaint  Patient presents with  . Weakness   Brief Narrative: 69 y.o. male with medical history significant for metastatic renal carcinoma with right nephrectomy, hypothyroidism, hypertension, diabetes mellitus, renal insufficiency, history of thyroid cancer, hypertension, hyperlipidemia, diabetes mellitus, depression, arthritis comes to the ED for evaluation of chest pain not feeling well. He reports he started to have swelling in legs since end of May and was seeing pcp and was told to increase his protein for low albumin. He was supposed to have nephrology eval and echo as outpatient. He had gained substantial weight, he was placed on lasix 40 mg since July 26 ( stopped his hctz and increased amlodipine to 10 mg bid and he has been peeing and lasix has  helped some. He started having shortness of breath DOE since last 2 days. He is having pain all over and on rt mid chest where he has rib " crack" for few months. He was seen in on 8/14 and back again due to same issues. He is having diarrehea with greenish loose stool, since 3 days and is on imodium. Patient otherwise denies any nausea,vomiting,fever, chills, headache, focal weakness, numbness tingling, speech difficulties.   ED Course:Vitals are stable afebrile, heart rate in 60s to 70s not hypoxic.  Work-up shows slightly positive troponin but borderline and elevated BNP in 390s, elevated creatinine,, blood sugar in 60.  Due to uncontrolled pain and given that patient has had recurrent elevated admission was requested to have him undergo further work-up for his renal dysfunction and possible CHF and fluid overload issues  Subjective: Sugar low last night- upset abt " low sugar diet". C/o diarrhea- not green anymore- it is yellow. Send for c diff. He has had 3 times during night and one this am. Creat up.  Assessment &  Plan:  Fluid overload generalized swelling, BNP slightly elevated in 300s.  Unclear etiology secondary to CKD/AKI, echo shows normal EF with EF 60 to 65%, no regional wall motion maladies, moderate LVH diastolic parameters are indeterminate, ? Diastolic dysfunction. His chemo is on hold.  He has a left solitary kidney.  On Lasix and not changed to torsemide 8/17 per nephro-due to ongoing swelling which has not improved.  We will continue diuresis as per nephrology.  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  He is not hypoxic not tachycardic, doubt PE.  His pain is controlled on tramadol and continue the same.   AKI on CKD stage IIIb; recent creat 3.82>3.18>  2.7. Creatinine has been high ranging 1.4-1.8 in 2020 and since January of this year he has been ranging in 1.8-2.1.  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 Renal ultrasound no hydronephrosis on the left, status post right nephrectomy.  Nephrology on board, noted bump in creatinine, patient is now on torsemide for his lower leg edema.  Monitor infection closely.  Continue to avoid nephrotoxic medication ACE inhibitor NSAIDs. Recent Labs  Lab 03/15/20 0911 03/17/20 2129 03/19/20 1313 03/19/20 2003 03/20/20 1059 03/21/20 0527  BUN 32* 43* 45*  --  41* 45*  CREATININE 2.85*  3.18* 2.82* 2.76* 2.36* 3.14*   Anion Gap Metabolic acidosis 2/2 aki/ckd- bicarb at 14. Level up at 16. Monitor  Diarrhea- not green anymore- it is yellow. Send for c diff. He has had 3 times during night and one this am.  Metastatic renal cell carcinoma history of right nephrectomy. He is on  Sutent at home  and currently on hold by his oncologist till his renal/cardiac issues sorted out  Leukopenia WBC count 1600 on admission has improved significantly. ?etiology.  No obvious infection noted.  Hypothyroidism-checked tsh up at 7.9. Cont home Synthroid if still high in 3 wk will need to up his synthroid.    Diabetes mellitus with hypoglycemia in 81s- 60s 8/17 overnight-I will stop Lantus continue sliding scale insulin.Patient reports that he does drop sugar by 100 during night ( he usually has an alarm to wake up during the night to check his sugar/he is to have hypoglycemia alarm that would wake him up but is working his pcp to refill his sensors) keep q 4-hour's cbg check  HTN : On atenolol, amlodipine 10 mg twice daily and minoxidil.  Keep his home meds but changed amlodipine to daily, wonder if swelling is associated/worsened by CCB, less likely.  Anemia of chronic disease hemoglobin at 9.8 g.  Monitor.  Is macrocytic,check B12 and folate.   DVT prophylaxis: heparin injection 5,000 Units Start: 03/19/20 2200 SCDs Start: 03/19/20 1811 Code Status:   Code Status: Full Code Family Communication: plan of care discussed with patient at bedside.  Status is: admitted as Observation Admitted under observation but patient continues to remain hospitalized for ongoing work-up of his renal dysfunction fluid overload/fluid management and and recurrent hypoglycemia needing close monitoring.  Dispo: The patient is from: Home              Anticipated d/c is to: Home  Cont PT/OT  eval              Anticipated d/c date is: 2 days              Patient currently is not medically stable to d/c.   Nutrition: Diet Order            Diet Heart Room service appropriate? Yes; Fluid consistency: Thin  Diet effective now                Body mass index is 29.42 kg/m.  Consultants:see note  Procedures:see note Microbiology:see note Blood Culture No results found for: SDES, SPECREQUEST, CULT, REPTSTATUS   Other culture-see note  Medications: Scheduled Meds: . acidophilus  1 capsule Oral Daily  . vitamin C  1,000 mg Oral Daily  . aspirin EC  81 mg Oral Daily  . atenolol  100 mg Oral Daily  . calcium-vitamin D  1 tablet Oral BID  . dorzolamide-timolol  1 drop Both Eyes BID  . famotidine  20 mg Oral Daily  . ferrous sulfate  325 mg Oral BID WC  . heparin  5,000 Units Subcutaneous Q8H  . insulin aspart  0-9 Units Subcutaneous TID WC  . latanoprost  1 drop Both Eyes QHS  . levothyroxine  300 mcg Oral QAC breakfast  . lipase/protease/amylase  12,000 Units Oral TID with meals  . minoxidil  10 mg Oral BID  . multivitamin with minerals  1 tablet Oral Daily  . pravastatin  40 mg Oral q1800  . sodium bicarbonate  650 mg Oral TID  . torsemide  20 mg Oral Daily  .  vitamin B-12  2,500 mcg Oral Daily   Continuous Infusions:  Antimicrobials: Anti-infectives (From admission, onward)   None       Objective: Vitals: Today's Vitals   03/21/20 0434 03/21/20 0439 03/21/20 0539 03/21/20 0900  BP: (!) 108/53     Pulse: 67     Resp: 18     Temp: 98.6 F (37 C)     TempSrc: Oral     SpO2: 95%     Weight:      Height:      PainSc:  5  3  2      Intake/Output Summary (Last 24 hours) at 03/21/2020 0955 Last data filed at 03/21/2020 0905 Gross per 24 hour  Intake 720 ml  Output --  Net 720 ml   Filed Weights   03/19/20 1231  Weight: 93 kg   Weight change:    Intake/Output from previous day: 08/17 0701 - 08/18 0700 In: 480 [P.O.:480] Out: -  Intake/Output this shift: Total I/O In: 240 [P.O.:240] Out: -   Examination:  General exam: AAOx3, ill,frail ,mildly heard of hearin, NAD, weak appearing. HEENT:Oral mucosa moist, Ear/Nose WNL grossly, dentition normal. Respiratory system: bilaterally clear,no wheezing or crackles,no use of accessory muscle Cardiovascular system: S1 & S2 +, No JVD,. Gastrointestinal system: Abdomen soft, NT,ND, BS+ Nervous System:Alert, awake, moving  extremities and grossly nonfocal Extremities: b/l pitting edema up to lower thigh, upper knee, distal peripheral pulses palpable.  Skin: No rashes,no icterus. MSK: Normal muscle bulk,tone, power Data Reviewed: I have personally reviewed following labs and imaging studies CBC: Recent Labs  Lab 03/15/20 0911 03/17/20 2129 03/19/20 1313 03/20/20 1642 03/21/20 0527  WBC 4.2 1.6* 1.7* 4.0 5.0  NEUTROABS 2.2  --  0.5* 1.4* 2.0  HGB 12.0* 11.6* 11.6* 10.1* 9.8*  HCT 35.6* 34.4* 34.9* 29.9* 29.7*  MCV 115.2* 117.4* 116.7* 118.2* 118.3*  PLT 261 226 250 196 768   Basic Metabolic Panel: Recent Labs  Lab 03/15/20 0911 03/17/20 2129 03/19/20 1313 03/19/20 2003 03/20/20 1059 03/21/20 0527  NA 137 136 138  --  144 140  K 4.9 5.0 5.0  --  3.8 3.5  CL 112* 113* 111  --  120* 115*  CO2 19* 17* 20*  --  14* 16*  GLUCOSE 240* 94 105*  --  129* 76  BUN 32* 43* 45*  --  41* 45*  CREATININE 2.85* 3.18* 2.82* 2.76* 2.36* 3.14*  CALCIUM 8.1* 7.8* 7.9*  --  6.7* 7.7*   GFR: Estimated Creatinine Clearance: 25.1 mL/min (A) (by C-G formula based on SCr of 3.14 mg/dL (H)). Liver Function Tests: Recent Labs  Lab 03/15/20 0911 03/19/20 1313  AST 40 38  ALT 33 23  ALKPHOS 135* 65  BILITOT 0.9 1.2  PROT 4.8* 4.8*  ALBUMIN 2.5* 2.4*   No results for input(s): LIPASE, AMYLASE in the last 168 hours. No results for input(s): AMMONIA in the last 168 hours. Coagulation Profile: No results for input(s): INR, PROTIME in the last 168 hours. Cardiac Enzymes: No results for input(s): CKTOTAL, CKMB, CKMBINDEX, TROPONINI in the last 168 hours. BNP (last 3 results) No results for input(s): PROBNP in the last 8760 hours. HbA1C: Recent Labs    03/19/20 2003  HGBA1C 6.4*   CBG: Recent Labs  Lab 03/21/20 0004 03/21/20 0432 03/21/20 0512 03/21/20 0606 03/21/20 0800  GLUCAP 76 62* 58* 89 95   Lipid Profile: No results for input(s): CHOL, HDL, LDLCALC, TRIG, CHOLHDL, LDLDIRECT in the last 72  hours. Thyroid Function Tests: Recent Labs    03/20/20 1100  TSH 7.945*   Anemia Panel: No results for input(s): VITAMINB12, FOLATE, FERRITIN, TIBC, IRON, RETICCTPCT in the last 72 hours. Sepsis Labs: No results for input(s): PROCALCITON, LATICACIDVEN in the last 168 hours.  Recent Results (from the past 240 hour(s))  SARS Coronavirus 2 by RT PCR (hospital order, performed in [facility] hospital lab) Nasopharyngeal Nasopharyngeal Swab     Status: None   Collection Time: 03/19/20  1:53 PM   Specimen: Nasopharyngeal Swab  Result Value Ref Range Status   SARS Coronavirus 2 NEGATIVE NEGATIVE Final    Comment: (NOTE) SARS-CoV-2 target nucleic acids are NOT DETECTED.  The SARS-CoV-2 RNA is generally detectable in upper and lower respiratory specimens during the acute phase of infection. The lowest concentration of SARS-CoV-2 viral copies this assay can detect is 250 copies / mL. A negative result does not preclude SARS-CoV-2 infection and should not be used as the sole basis for treatment or other patient management decisions.  A negative result may occur with improper specimen collection / handling, submission of specimen other than nasopharyngeal swab, presence of viral mutation(s) within the areas targeted by this assay, and inadequate number of viral copies (<250 copies / mL). A negative result must be combined with clinical observations, patient history, and epidemiological information.  Fact Sheet for Patients:   StrictlyIdeas.no  Fact Sheet for Healthcare Providers: BankingDealers.co.za  This test is not yet approved or  cleared by the [place] FDA and has been authorized for detection and/or diagnosis of SARS-CoV-2 by FDA under an Emergency Use Authorization (EUA).  This EUA will remain in effect (meaning this test can be used) for the duration of the COVID-19 declaration under Section 564(b)(1) of the Act, 21  U.S.C. section 360bbb-3(b)(1), unless the authorization is terminated or revoked sooner.  Performed at [facility], [place] [address] [place], [place] [address]       Radiology Studies: DG Chest 2 View  Result Date: 03/19/2020 CLINICAL DATA:  69 year old male with history of chest pain and shortness of breath. EXAM: CHEST - 2 VIEW COMPARISON:  Chest x-ray 03/17/2020. FINDINGS: Linear areas of architectural distortion in the right mid to lower lung. Left lung is clear. No pleural effusions. No pneumothorax. No evidence of pulmonary edema. Heart size is normal. Upper mediastinal contours are within normal limits. IMPRESSION: 1. Linear areas of architectural distortion in the right mid to lower lung likely to reflect areas of evolving postradiation change. No radiographic evidence of acute cardiopulmonary disease. Electronically Signed   By: Vinnie Langton M.D.   On: 03/19/2020 14:02   US RENAL  Result Date: 03/19/2020 CLINICAL DATA:  69 year old male with acute renal insufficiency. Prior right nephrectomy. EXAM: RENAL / URINARY TRACT ULTRASOUND COMPLETE COMPARISON:  Renal ultrasound dated 12/17/2010. FINDINGS: Right Kidney: Nephrectomy. Left Kidney: Renal measurements: 11.6 x 7.0 x 8.3 cm = volume: 336 mL. Normal echogenicity. There is an 11 mm echogenic focus in the inferior pole of the left kidney, likely a nonobstructing calculus. No hydronephrosis. Bladder: Appears normal for degree of bladder distention. Other: Small free fluid noted within the pelvis. IMPRESSION: 1. Status post prior right nephrectomy. 2. A nonobstructing left renal inferior pole calculus. No hydronephrosis. 3. Small ascites. Electronically Signed   By: Anner Crete M.D.   On: 03/19/2020 18:46   ECHOCARDIOGRAM COMPLETE  Result Date: 03/20/2020    ECHOCARDIOGRAM REPORT   Patient Name:   MERICK KELLEHER Date of  Exam: 03/20/2020 Medical Rec #:  [id number]         Height:       70.0 in Accession #:     [id number]        Weight:       205.0 lb Date of Birth:  04-24-1951         BSA:          2.109 m Patient Age:    47 years          BP:           122/68 mmHg Patient Gender: M                 HR:           68 bpm. Exam Location:  Inpatient Procedure: 2D Echo, Cardiac Doppler and Color Doppler Indications:    I50.9* Heart failure (unspecified)  History:        Patient has no prior history of Echocardiogram examinations.                 CHF, Signs/Symptoms:Chest Pain and Dyspnea; Risk                 Factors:Hypertension, Diabetes and Dyslipidemia. Edema.                 Chemotherapy. Metastatic cancer.  Sonographer:    Roseanna Rainbow RDCS Referring Phys: [id number] [facility]  Sonographer Comments: Technically difficult study due to poor echo windows. Limited mobility. IMPRESSIONS  1. Left ventricular ejection fraction, by estimation, is 60 to 65%. The left ventricle has normal function. The left ventricle has no regional wall motion abnormalities. There is moderate left ventricular hypertrophy. Left ventricular diastolic parameters are indeterminate.  2. Right ventricular systolic function is normal. The right ventricular size is normal. Tricuspid regurgitation signal is inadequate for assessing PA pressure.  3. The mitral valve is normal in structure. Trivial mitral valve regurgitation.  4. The aortic valve is tricuspid. Aortic valve regurgitation is not visualized. Mild aortic valve sclerosis is present, with no evidence of aortic valve stenosis. FINDINGS  Left Ventricle: Left ventricular ejection fraction, by estimation, is 60 to 65%. The left ventricle has normal function. The left ventricle has no regional wall motion abnormalities. The left ventricular internal cavity size was normal in size. There is  moderate left ventricular hypertrophy. Left ventricular diastolic parameters are indeterminate. Right Ventricle: The right ventricular size is normal. No increase in right ventricular wall thickness. Right ventricular  systolic function is normal. Tricuspid regurgitation signal is inadequate for assessing PA pressure. Left Atrium: Left atrial size was normal in size. Right Atrium: Right atrial size was normal in size. Pericardium: Trivial pericardial effusion is present. Mitral Valve: The mitral valve is normal in structure. Trivial mitral valve regurgitation. Tricuspid Valve: The tricuspid valve is normal in structure. Tricuspid valve regurgitation is trivial. Aortic Valve: The aortic valve is tricuspid. Aortic valve regurgitation is not visualized. Mild aortic valve sclerosis is present, with no evidence of aortic valve stenosis. Pulmonic Valve: The pulmonic valve was grossly normal. Pulmonic valve regurgitation is trivial. Aorta: The aortic root is normal in size and structure. IAS/Shunts: The interatrial septum was not well visualized.  LEFT VENTRICLE PLAX 2D LVIDd:         4.40 cm      Diastology LVIDs:         2.80 cm      LV e' lateral:   12.00 cm/s LV PW:  1.60 cm      LV E/e' lateral: 4.3 LV IVS:        1.70 cm      LV e' medial:    5.11 cm/s LVOT diam:     2.30 cm      LV E/e' medial:  10.2 LV SV:         112 LV SV Index:   53 LVOT Area:     4.15 cm  LV Volumes (MOD) LV vol d, MOD A2C: 102.0 ml LV vol d, MOD A4C: 86.4 ml LV vol s, MOD A2C: 33.0 ml LV vol s, MOD A4C: 38.4 ml LV SV MOD A2C:     69.0 ml LV SV MOD A4C:     86.4 ml LV SV MOD BP:      64.9 ml RIGHT VENTRICLE RV S prime:     16.30 cm/s TAPSE (M-mode): 2.4 cm LEFT ATRIUM             Index       RIGHT ATRIUM           Index LA diam:        4.20 cm 1.99 cm/m  RA Area:     12.40 cm LA Vol (A2C):   45.4 ml 21.52 ml/m RA Volume:   22.40 ml  10.62 ml/m LA Vol (A4C):   29.3 ml 13.89 ml/m LA Biplane Vol: 38.9 ml 18.44 ml/m  AORTIC VALVE LVOT Vmax:   145.00 cm/s LVOT Vmean:  92.400 cm/s LVOT VTI:    0.269 m  AORTA Ao Root diam: 3.50 cm MITRAL VALVE MV Area (PHT): 2.36 cm    SHUNTS MV Decel Time: 322 msec    Systemic VTI:  0.27 m MV E velocity: 52.00 cm/s   Systemic Diam: 2.30 cm MV A velocity: 55.85 cm/s MV E/A ratio:  0.93 Oswaldo Milian MD Electronically signed by Oswaldo Milian MD Signature Date/Time: 03/20/2020/9:00:18 PM    Final      LOS: 0 days   Antonieta Pert, MD [facility]  03/21/2020, 9:55 AM

## 2020-03-21 NOTE — Progress Notes (Signed)
Initial Nutrition Assessment  DOCUMENTATION CODES:   Not applicable  INTERVENTION:  - will order [employer] 1.4 po once/day, each supplement provides 455 kcal and 20 grams protein. - will order 30 mL Prosource Plus BID, each supplement provides 100 kcal and 15 grams of protein.  NUTRITION DIAGNOSIS:   Increased nutrient needs related to acute illness, chronic illness as evidenced by estimated needs.  GOAL:   Patient will meet greater than or equal to 90% of their needs  MONITOR:   PO intake, Supplement acceptance, Labs, Weight trends  REASON FOR ASSESSMENT:   Malnutrition Screening Tool  ASSESSMENT:   69 y.o. male with medical history of metastatic renal carcinoma s/p R nephrectomy, hypothyroidism, HTN, DM, renal insufficiency, thyroid cancer, HLD, depression, and arthritis. He presented to the ED due to chest pain and feeling unwell. He was having significant BLE swelling and was started on Lasix on 7/26.  Patient ate 100% of breakfast today (665 kcal, 17 grams protein). Unable to talk with patient at this time.   Weight on 8/16 was 205 lb which is the same as on 8/12. Weight had previously been stable at 190-195 lb from 09/2018-5/201. Flow sheet documentation indicates moderate pitting edema to BLE.   Per notes: - fluid overload - AKI on stage 3 CKD - ongoing diarrhea with plan for c.diff testing - metastatic renal cell carcinoma s/p R nephrectomy PTA   Labs reviewed; CBGs: 198-58 mg/dl, Cl: 115 mmol/l, BUN: 45 mg/dl, creatinine: 3.14 mg/dl, Ca: 7.7 mg/dl, GFR: 19 ml/min. Medications reviewed; 1 capsule risaquad/day, 1000 mg ascorbic acid/day, 1 tablet oscal-D BID, 325 mg ferrous sulfate BID, sliding scale novolog, 300 mcg oral synthroid/day, 1200 units creon TID, 1 tablet multivitamin with minerals/day, 650 mg sodium bicarb TID, 2500 mcg oral cyanocobalamin/day.    Diet Order:   Diet Order            Diet Heart Room service appropriate? Yes; Fluid consistency: Thin   Diet effective now                 EDUCATION NEEDS:   No education needs have been identified at this time  Skin:  Skin Assessment: Reviewed RN Assessment  Last BM:  8/18 x2  Height:   Ht Readings from Last 1 Encounters:  03/19/20 5\' 10"  (1.778 m)    Weight:   Wt Readings from Last 1 Encounters:  03/19/20 93 kg     Estimated Nutritional Needs:  Kcal:  2000-2200 kcal Protein:  105-115 grams Fluid:  >/= 1.5 L/day     Jarome Matin, MS, RD, LDN, CNSC Inpatient Clinical Dietitian RD pager # available in [facility]  After hours/weekend pager # available in [facility]

## 2020-03-21 NOTE — Evaluation (Signed)
Physical Therapy Evaluation Patient Details Name: Dylan Jefferson MRN: [id number] DOB: May 06, 1951 Today's Date: 03/21/2020   History of Present Illness  Dylan Jefferson is a 69 y.o. male with medical history significant for metastatic renal carcinoma with right nephrectomy, hypothyroidism, hypertension, diabetes mellitus, renal insufficiency, history of thyroid cancer, hypertension, hyperlipidemia, diabetes mellitus, depression, and arthritis. Patient presents with complaints of generalized weakness and diarrhea.  Clinical Impression  The patient is ambulating in room without assistance nor AD. Patient does not require acute PT so PT will sign off.    Follow Up Recommendations No PT follow up    Equipment Recommendations  None recommended by PT    Recommendations for Other Services       Precautions / Restrictions Precautions Precautions: None Restrictions Weight Bearing Restrictions: No      Mobility  Bed Mobility Overal bed mobility: Independent             General bed mobility comments: Increased time.  Transfers Overall transfer level: Needs assistance Equipment used: None Transfers: Sit to/from Stand Sit to Stand: Supervision         General transfer comment: Supervision for all ambulation in room without assistance.  Ambulation/Gait Ambulation/Gait assistance: Supervision Gait Distance (Feet): 80 Feet Assistive device: None Gait Pattern/deviations: WFL(Within Functional Limits) Gait velocity: decreased.   General Gait Details: gait  is slow,  Stairs            Wheelchair Mobility    Modified Rankin (Stroke Patients Only)       Balance Overall balance assessment: No apparent balance deficits (not formally assessed)                                           Pertinent Vitals/Pain Pain Assessment: Faces Faces Pain Scale: Hurts little more Pain Location: both legs.    Home Living Family/patient expects to be  discharged to:: Private residence Living Arrangements: Spouse/significant other Available Help at Discharge: Available 24 hours/day Type of Home: House Home Access: Stairs to enter Entrance Stairs-Rails: None Entrance Stairs-Number of Steps: 2 Home Layout: Other (Comment) (split level) Home Equipment: Shower seat Additional Comments: 4 steps up, 5 steps down.    Prior Function Level of Independence: Independent               Hand Dominance   Dominant Hand: Right    Extremity/Trunk Assessment   Upper Extremity Assessment Upper Extremity Assessment: Overall WFL for tasks assessed    Lower Extremity Assessment Lower Extremity Assessment: Generalized weakness    Cervical / Trunk Assessment Cervical / Trunk Assessment: Normal  Communication   Communication: No difficulties  Cognition Arousal/Alertness: Awake/alert Behavior During Therapy: WFL for tasks assessed/performed Overall Cognitive Status: Within Functional Limits for tasks assessed                                        General Comments      Exercises     Assessment/Plan    PT Assessment Patent does not need any further PT services  PT Problem List         PT Treatment Interventions      PT Goals (Current goals can be found in the Care Plan section)  Acute Rehab PT Goals Patient Stated Goal: go home PT Goal Formulation:  All assessment and education complete, DC therapy    Frequency     Barriers to discharge        Co-evaluation               AM-PAC PT "6 Clicks" Mobility  Outcome Measure Help needed turning from your back to your side while in a flat bed without using bedrails?: None Help needed moving from lying on your back to sitting on the side of a flat bed without using bedrails?: None Help needed moving to and from a bed to a chair (including a wheelchair)?: A Little Help needed standing up from a chair using your arms (e.g., wheelchair or bedside chair)?: A  Little Help needed to walk in hospital room?: A Little Help needed climbing 3-5 steps with a railing? : A Little 6 Click Score: 20    End of Session   Activity Tolerance: Patient tolerated treatment well Patient left: in chair;with call bell/phone within reach;with chair alarm set Nurse Communication: Mobility status PT Visit Diagnosis: Unsteadiness on feet (R26.81);Difficulty in walking, not elsewhere classified (R26.2)    Time: 1117-3567 PT Time Calculation (min) (ACUTE ONLY): 13 min   Charges:   PT Evaluation $PT Eval Low Complexity: [place] [facility] Acute Rehabilitation Services Pager [phone] Office [phone]   Claretha Cooper 03/21/2020, 1:40 PM

## 2020-03-21 NOTE — Plan of Care (Signed)
  Problem: Education: Goal: Knowledge of General Education information will improve Description: Including pain rating scale, medication(s)/side effects and non-pharmacologic comfort measures Outcome: Completed/Met   Problem: Activity: Goal: Risk for activity intolerance will decrease Outcome: Completed/Met   Problem: Pain Managment: Goal: General experience of comfort will improve Outcome: Completed/Met   Problem: Safety: Goal: Ability to remain free from injury will improve Outcome: Completed/Met   

## 2020-03-21 NOTE — Plan of Care (Signed)
  Problem: Education: Goal: Knowledge of General Education information will improve Description: Including pain rating scale, medication(s)/side effects and non-pharmacologic comfort measures Outcome: Progressing   Problem: Activity: Goal: Risk for activity intolerance will decrease Outcome: Progressing   

## 2020-03-22 DIAGNOSIS — N289 Disorder of kidney and ureter, unspecified: Secondary | ICD-10-CM

## 2020-03-22 LAB — CBC WITH DIFFERENTIAL/PLATELET
Abs Immature Granulocytes: 0.05 10*3/uL (ref 0.00–0.07)
Basophils Absolute: 0 10*3/uL (ref 0.0–0.1)
Basophils Relative: 0 %
Eosinophils Absolute: 0.3 10*3/uL (ref 0.0–0.5)
Eosinophils Relative: 6 %
HCT: 28.2 % — ABNORMAL LOW (ref 39.0–52.0)
Hemoglobin: 9.3 g/dL — ABNORMAL LOW (ref 13.0–17.0)
Immature Granulocytes: 1 %
Lymphocytes Relative: 36 %
Lymphs Abs: 1.8 10*3/uL (ref 0.7–4.0)
MCH: 38.8 pg — ABNORMAL HIGH (ref 26.0–34.0)
MCHC: 33 g/dL (ref 30.0–36.0)
MCV: 117.5 fL — ABNORMAL HIGH (ref 80.0–100.0)
Monocytes Absolute: 0.8 10*3/uL (ref 0.1–1.0)
Monocytes Relative: 17 %
Neutro Abs: 1.9 10*3/uL (ref 1.7–7.7)
Neutrophils Relative %: 40 %
Platelets: 206 10*3/uL (ref 150–400)
RBC: 2.4 MIL/uL — ABNORMAL LOW (ref 4.22–5.81)
RDW: 15.6 % — ABNORMAL HIGH (ref 11.5–15.5)
WBC: 4.9 10*3/uL (ref 4.0–10.5)
nRBC: 0 % (ref 0.0–0.2)

## 2020-03-22 LAB — GLUCOSE, CAPILLARY
Glucose-Capillary: 126 mg/dL — ABNORMAL HIGH (ref 70–99)
Glucose-Capillary: 139 mg/dL — ABNORMAL HIGH (ref 70–99)
Glucose-Capillary: 140 mg/dL — ABNORMAL HIGH (ref 70–99)
Glucose-Capillary: 189 mg/dL — ABNORMAL HIGH (ref 70–99)
Glucose-Capillary: 200 mg/dL — ABNORMAL HIGH (ref 70–99)
Glucose-Capillary: 232 mg/dL — ABNORMAL HIGH (ref 70–99)

## 2020-03-22 LAB — BASIC METABOLIC PANEL
Anion gap: 7 (ref 5–15)
BUN: 44 mg/dL — ABNORMAL HIGH (ref 8–23)
CO2: 17 mmol/L — ABNORMAL LOW (ref 22–32)
Calcium: 7.5 mg/dL — ABNORMAL LOW (ref 8.9–10.3)
Chloride: 115 mmol/L — ABNORMAL HIGH (ref 98–111)
Creatinine, Ser: 3.09 mg/dL — ABNORMAL HIGH (ref 0.61–1.24)
GFR calc Af Amer: 22 mL/min — ABNORMAL LOW (ref >60)
GFR calc non Af Amer: 19 mL/min — ABNORMAL LOW (ref >60)
Glucose, Bld: 143 mg/dL — ABNORMAL HIGH (ref 70–99)
Potassium: 3.4 mmol/L — ABNORMAL LOW (ref 3.5–5.1)
Sodium: 139 mmol/L (ref 135–145)

## 2020-03-22 LAB — T4, FREE: Free T4: 0.98 ng/dL (ref 0.61–1.12)

## 2020-03-22 LAB — VITAMIN B12: Vitamin B-12: 3200 pg/mL — ABNORMAL HIGH (ref 180–914)

## 2020-03-22 LAB — FOLATE: Folate: 23 ng/mL (ref >5.9)

## 2020-03-22 MED ORDER — POTASSIUM CHLORIDE CRYS ER 20 MEQ PO TBCR
20.0000 meq | EXTENDED_RELEASE_TABLET | Freq: Once | ORAL | Status: AC
  Administered 2020-03-22: 20 meq via ORAL
  Filled 2020-03-22: qty 1

## 2020-03-22 MED ORDER — MINOXIDIL 2.5 MG PO TABS
2.5000 mg | ORAL_TABLET | Freq: Two times a day (BID) | ORAL | Status: DC
  Administered 2020-03-22 – 2020-03-23 (×2): 2.5 mg via ORAL
  Filled 2020-03-22 (×2): qty 1

## 2020-03-22 NOTE — Progress Notes (Signed)
[facility] Progress Note    Assessment/ Plan:   AKI on CKD3: -Baseline creatinine around 1.9-2.1.  CKD etiology likely related to arteriosclerosis along with the fact that he has a solitary kidney from a right nephrectomy -Etiology of his AKI may be related to decreased effective arterial blood volume in the setting of cardiorenal syndrome?  seems that he may have a perfusion issue in the setting of relative hypotension. Diarrhea is also contributing to a pre-renal picture which I suspect is the cause of his Cr going up today to 3.1. Regardless, he needs to offload volume so will continue him on a low dose torsemide -Urine protein creatinine ratio is 0.5 g -Repeat urinalysis with sediment exam, reordered -Renal ultrasound without hydronephrosis does have a nonobstructing kidney stone -Currently no indication for HD -Continue to monitor daily Cr, Dose meds for GFR<15 -Monitor Daily I/Os, Daily weight  -Maintain MAP>65 for optimal renal perfusion.  -Agree with holding ACE-I, avoid further nephrotoxins including NSAIDS, Morphine.  Unless absolutely necessary, avoid CT with contrast and/or MRI with gadolinium.     Hypervolemia -echo pending, r/o HFrEF -torsemide 20mg  daily--> If BP comes up with decrease of minoxidil to 2.5 mg BID would increase to 40 mg daily -sodium restriction - have added daily weights, strict I/O, and fluid restriction  Metastatic RCC, h/o right nephrectomy -agree with holding sunitinib especially if concern for CHF and increase in serum Cr -onc on board  Anion gap metabolic acidosis, improved -Likely secondary to diarrhea -If worsening bicarb and recommend checking a blood gas -agree with sodium bicarbonate tablets for now  Hypoalbuminemia -encouraged protein intake, encouraged supplementation with ensure vs boost  Hypertension, now with relative hypotension: d/c'ed norvasc for now. - decrease minoxidil to 2.5 mg  BID  Leukopenia/neutropenia -total wbc improved, anc improved  Macrocytic Anemia: -check b12, folate -Transfuse for Hgb<7 g/dL  Diabetes Mellitus -mgmt per primary service  Diarrhea -ruling out cdiff  Madelon Lips MD [facility]  Subjective:    Doing well with    Objective:   BP (!) 101/59 (BP Location: Left Arm)   Pulse 68   Temp 97.9 F (36.6 C) (Oral)   Resp 18   Ht 5\' 10"  (1.778 m)   Wt 93 kg   SpO2 97%   BMI 29.42 kg/m   Intake/Output Summary (Last 24 hours) at 03/22/2020 1610 Last data filed at 03/22/2020 1350 Gross per 24 hour  Intake 480 ml  Output 1200 ml  Net -720 ml   Weight change:   Physical Exam: Gen:nad, sitting up in bed CVS:s1s2, rrr, no m/r/g Resp:cta bl, no w/r/r/c, unlabored, bl chest expansion XFG:HWEX, nt/nd Ext:3+ pitting edema bl le's up to lower thighs Neuro: speech clear and coherent, moves all extremities spontaneously  Imaging: No results found.  Labs: BMET Recent Labs  Lab 03/17/20 2129 03/19/20 1313 03/19/20 2003 03/20/20 1059 03/21/20 0527 03/22/20 0532  NA 136 138  --  144 140 139  K 5.0 5.0  --  3.8 3.5 3.4*  CL 113* 111  --  120* 115* 115*  CO2 17* 20*  --  14* 16* 17*  GLUCOSE 94 105*  --  129* 76 143*  BUN 43* 45*  --  41* 45* 44*  CREATININE 3.18* 2.82* 2.76* 2.36* 3.14* 3.09*  CALCIUM 7.8* 7.9*  --  6.7* 7.7* 7.5*   CBC Recent Labs  Lab 03/19/20 1313 03/20/20 1642 03/21/20 0527 03/22/20 0532  WBC 1.7* 4.0 5.0 4.9  NEUTROABS 0.5*  1.4* 2.0 1.9  HGB 11.6* 10.1* 9.8* 9.3*  HCT 34.9* 29.9* 29.7* 28.2*  MCV 116.7* 118.2* 118.3* 117.5*  PLT 250 196 209 206    Medications:    . (feeding supplement) PROSource Plus  30 mL Oral BID BM  . acidophilus  1 capsule Oral Daily  . vitamin C  1,000 mg Oral Daily  . aspirin EC  81 mg Oral Daily  . atenolol  100 mg Oral Daily  . calcium-vitamin D  1 tablet Oral BID  . dorzolamide-timolol  1 drop Both Eyes BID  . famotidine  20 mg Oral  Daily  . feeding supplement ([employer] STANDARD 1.4)  325 mL Oral Daily  . ferrous sulfate  325 mg Oral BID WC  . heparin  5,000 Units Subcutaneous Q8H  . insulin aspart  0-9 Units Subcutaneous TID WC  . latanoprost  1 drop Both Eyes QHS  . levothyroxine  300 mcg Oral QAC breakfast  . lipase/protease/amylase  12,000 Units Oral TID with meals  . minoxidil  2.5 mg Oral BID  . multivitamin with minerals  1 tablet Oral Daily  . pravastatin  40 mg Oral q1800  . sodium bicarbonate  650 mg Oral TID  . torsemide  20 mg Oral Daily  . vitamin B-12  2,500 mcg Oral Daily

## 2020-03-22 NOTE — Progress Notes (Signed)
PROGRESS NOTE    Dylan Jefferson  GUR:[id number] DOB: [birth year] DOA: 03/19/2020 PCP: [name], MD   Chief Complaint  Patient presents with  . Weakness   Brief Narrative: 69 y.o. male with medical history significant for metastatic renal carcinoma with right nephrectomy, hypothyroidism, hypertension, diabetes mellitus, renal insufficiency, history of thyroid cancer, hypertension, hyperlipidemia, diabetes mellitus, depression, arthritis comes to the ED for evaluation of chest pain not feeling well. He reports he started to have swelling in legs since end of May and was seeing pcp and was told to increase his protein for low albumin. He was supposed to have nephrology eval and echo as outpatient. He had gained substantial weight, he was placed on lasix 40 mg since July 26 ( stopped his hctz and increased amlodipine to 10 mg bid and he has been peeing and lasix has  helped some. He started having shortness of breath DOE since last 2 days. He is having pain all over and on rt mid chest where he has rib " crack" for few months. He was seen in on 8/14 and back again due to same issues. He is having diarrehea with greenish loose stool, since 3 days and is on imodium. Patient otherwise denies any nausea,vomiting,fever, chills, headache, focal weakness, numbness tingling, speech difficulties.   ED Course:Vitals are stable afebrile, heart rate in 60s to 70s not hypoxic.  Work-up shows slightly positive troponin but borderline and elevated BNP in 390s, elevated creatinine,, blood sugar in 60.  Due to uncontrolled pain and given that patient has had recurrent elevated admission was requested to have him undergo further work-up for his renal dysfunction and possible CHF and fluid overload issues  Subjective:  Creatinine stable, leg swelling significantly better today.  On torsemide.  Overall feels well, no nausea vomiting fever chills.  Assessment & Plan:  Fluid overload generalized swelling, BNP  slightly elevated in 300s. Suspect secondary to CKD/AKI,hypoalbuminemia Echo shows normal EF with EF 60 to 65%, no regional wall motion maladies, moderate LVH diastolic parameters are indeterminate. His chemo is on hold.He has a left solitary kidney. Now on torsemide  since 8/17 leg swelling is much better today, will need to optimize his fluid status and his creatinine will remain up.  Appreciate nephrology input.  Continue diuresis and monitor bmp  Pleuritic chest pain on the right ant rib-on the CT chest on 8/3 he does have ununited pathological anterior right fourth rib fractures with underlying mixed lytic and sclerotic changes/history of radiation-with postradiation changes with consolidation in the right middle lobe and anterior right lower lobe on that CT chest.   No more pain.  Continue tramadol.  AKI on CKD stage IIIb; recent creat 3.82>3.18>  2.7.> 3.14> 3.0. Creatinine has been high ranging 1.4-1.8 in 2020 and since January of this year he has been ranging in 1.8-2.1.  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 Renal ultrasound no hydronephrosis on the left, status post right nephrectomy.  Appreciate nephrology input, tolerating torsemide monitor BMP in a.m.  Minimize nephrotoxic medication including ACE/ARBs or NSAIDs Recent Labs  Lab 03/17/20 2129 03/17/20 2129 03/19/20 1313 03/19/20 2003 03/20/20 1059 03/21/20 0527 03/22/20 0532  BUN 43*  --  45*  --  41* 45* 44*  CREATININE 3.18*   < > 2.82* 2.76* 2.36* 3.14* 3.09*   < > = values in this interval not displayed.   Anion Gap Metabolic acidosis 2/2 aki/ckd- bicarb at 14> 17.continue  to monitor renal function.  Hypokalemia replete.  Diarrhea- Neg C Diff.  Continue supportive care.  Metastatic renal cell carcinoma history of right nephrectomy. He is on  Sutent at home and currently on hold by his oncologist till his renal/cardiac issues sorted  out  Leukopenia:Has resolved.   Hypothyroidism- FT4 nl. TSH 7.9 slightly up. Cont home Synthroid if still high in 3 wk will need to up his synthroid.    Diabetes mellitus with hypoglycemia in 50s- 60s 8/17 overnight per glycemia resolved.  Lantus has been on hold.  Continue sliding scale insulin.Patient reports that he does drop sugar by 100 during night ( he usually has an alarm to wake up during the night to check his sugar/he is to have hypoglycemia alarm that would wake him up but is working his pcp to refill his sensors) keep q 4-hour's cbg check  HTN : On atenolol, amlodipine 10 mg twice daily and minoxidil.  Keep his home meds but changed amlodipine to daily, wonder if swelling is associated/worsened by CCB, less likely.  Anemia of chronic disease hemoglobin at 9.8-> 9.3 g.  Monitor.  Is macrocytic,high B12 and  Nl folate.  DVT prophylaxis: heparin injection 5,000 Units Start: 03/19/20 2200 SCDs Start: 03/19/20 1811 Code Status:   Code Status: Full Code Family Communication: plan of care discussed with patient at bedside.  Status is: admitted as Observation Admitted under observation but patient continues to remain hospitalized for ongoing work-up of his renal dysfunction fluid overload/fluid management and and recurrent hypoglycemia needing close monitoring.  Dispo: The patient is from: Home              Anticipated d/c is to: Home  Cont PT/OT  eval              Anticipated d/c date is: 1 day              Patient currently is not medically stable to d/c.  Discharge home tomorrow if swelling improves and renal function is stable and if okay with nephrology   Nutrition: Diet Order            Diet Heart Room service appropriate? Yes; Fluid consistency: Thin  Diet effective now                Body mass index is 29.42 kg/m.  Consultants:see note  Procedures:see note Microbiology:see note Blood Culture No results found for: SDES, SPECREQUEST, CULT, REPTSTATUS  Other  culture-see note  Medications: Scheduled Meds: . (feeding supplement) PROSource Plus  30 mL Oral BID BM  . acidophilus  1 capsule Oral Daily  . vitamin C  1,000 mg Oral Daily  . aspirin EC  81 mg Oral Daily  . atenolol  100 mg Oral Daily  . calcium-vitamin D  1 tablet Oral BID  . dorzolamide-timolol  1 drop Both Eyes BID  . famotidine  20 mg Oral Daily  . feeding supplement (KATE FARMS STANDARD 1.4)  325 mL Oral Daily  . ferrous sulfate  325 mg Oral BID WC  . heparin  5,000 Units Subcutaneous Q8H  . insulin aspart  0-9 Units Subcutaneous TID WC  . latanoprost  1 drop Both Eyes QHS  . levothyroxine  300 mcg Oral QAC breakfast  . lipase/protease/amylase  12,000 Units Oral TID with meals  . minoxidil  5 mg Oral BID  . multivitamin with minerals  1 tablet Oral Daily  . pravastatin  40 mg Oral q1800  . sodium bicarbonate  650  mg Oral TID  . torsemide  20 mg Oral Daily  . vitamin B-12  2,500 mcg Oral Daily   Continuous Infusions:  Antimicrobials: Anti-infectives (From admission, onward)   None       Objective: Vitals: Today's Vitals   03/21/20 2128 03/22/20 0414 03/22/20 0830 03/22/20 1142  BP:  (!) 99/58  (!) 101/59  Pulse:  63  68  Resp:  18  18  Temp:  98.4 F (36.9 C)  97.9 F (36.6 C)  TempSrc:  Oral  Oral  SpO2:  97%  97%  Weight:      Height:      PainSc: 0-No pain  0-No pain     Intake/Output Summary (Last 24 hours) at 03/22/2020 1517 Last data filed at 03/22/2020 0958 Gross per 24 hour  Intake 240 ml  Output 1200 ml  Net -960 ml   Filed Weights   03/19/20 1231  Weight: 93 kg   Weight change:    Intake/Output from previous day: 08/18 0701 - 08/19 0700 In: 620 [P.O.:620] Out: 850 [Urine:700; Stool:150] Intake/Output this shift: Total I/O In: 240 [P.O.:240] Out: 500 [Urine:500]  Examination:  General exam: AAOx3 , NAD, weak appearing. HEENT:Oral mucosa moist, Ear/Nose WNL grossly, dentition normal. Respiratory system: bilaterally clear,no  wheezing or crackles,no use of accessory muscle Cardiovascular system: S1 & S2 +, No JVD,. Gastrointestinal system: Abdomen soft, NT,ND, BS+ Nervous System:Alert, awake, moving extremities and grossly nonfocal Extremities: Mild ankle edema, distal peripheral pulses palpable.  Skin: No rashes,no icterus. MSK: Normal muscle bulk,tone, power Data Reviewed: I have personally reviewed following labs and imaging studies CBC: Recent Labs  Lab 03/17/20 2129 03/19/20 1313 03/20/20 1642 03/21/20 0527 03/22/20 0532  WBC 1.6* 1.7* 4.0 5.0 4.9  NEUTROABS  --  0.5* 1.4* 2.0 1.9  HGB 11.6* 11.6* 10.1* 9.8* 9.3*  HCT 34.4* 34.9* 29.9* 29.7* 28.2*  MCV 117.4* 116.7* 118.2* 118.3* 117.5*  PLT 226 250 196 209 811   Basic Metabolic Panel: Recent Labs  Lab 03/17/20 2129 03/17/20 2129 03/19/20 1313 03/19/20 2003 03/20/20 1059 03/21/20 0527 03/22/20 0532  NA 136  --  138  --  144 140 139  K 5.0  --  5.0  --  3.8 3.5 3.4*  CL 113*  --  111  --  120* 115* 115*  CO2 17*  --  20*  --  14* 16* 17*  GLUCOSE 94  --  105*  --  129* 76 143*  BUN 43*  --  45*  --  41* 45* 44*  CREATININE 3.18*   < > 2.82* 2.76* 2.36* 3.14* 3.09*  CALCIUM 7.8*  --  7.9*  --  6.7* 7.7* 7.5*   < > = values in this interval not displayed.   GFR: Estimated Creatinine Clearance: 25.5 mL/min (A) (by C-G formula based on SCr of 3.09 mg/dL (H)). Liver Function Tests: Recent Labs  Lab 03/19/20 1313  AST 38  ALT 23  ALKPHOS 65  BILITOT 1.2  PROT 4.8*  ALBUMIN 2.4*   No results for input(s): LIPASE, AMYLASE in the last 168 hours. No results for input(s): AMMONIA in the last 168 hours. Coagulation Profile: No results for input(s): INR, PROTIME in the last 168 hours. Cardiac Enzymes: No results for input(s): CKTOTAL, CKMB, CKMBINDEX, TROPONINI in the last 168 hours. BNP (last 3 results) No results for input(s): PROBNP in the last 8760 hours. HbA1C: Recent Labs    03/19/20 2003  HGBA1C 6.4*   CBG: Recent Labs  Lab 03/21/20 2029 03/22/20 0006 03/22/20 0412 03/22/20 0741 03/22/20 1140  GLUCAP 171* 139* 126* 140* 200*   Lipid Profile: No results for input(s): CHOL, HDL, LDLCALC, TRIG, CHOLHDL, LDLDIRECT in the last 72 hours. Thyroid Function Tests: Recent Labs    03/20/20 1100 03/22/20 0532  TSH 7.945*  --   FREET4  --  0.98   Anemia Panel: Recent Labs    03/22/20 0532  VITAMINB12 3,200*  FOLATE 23.0   Sepsis Labs: No results for input(s): PROCALCITON, LATICACIDVEN in the last 168 hours.  Recent Results (from the past 240 hour(s))  SARS Coronavirus 2 by RT PCR (hospital order, performed in [facility] hospital lab) Nasopharyngeal Nasopharyngeal Swab     Status: None   Collection Time: 03/19/20  1:53 PM   Specimen: Nasopharyngeal Swab  Result Value Ref Range Status   SARS Coronavirus 2 NEGATIVE NEGATIVE Final    Comment: (NOTE) SARS-CoV-2 target nucleic acids are NOT DETECTED.  The SARS-CoV-2 RNA is generally detectable in upper and lower respiratory specimens during the acute phase of infection. The lowest concentration of SARS-CoV-2 viral copies this assay can detect is 250 copies / mL. A negative result does not preclude SARS-CoV-2 infection and should not be used as the sole basis for treatment or other patient management decisions.  A negative result may occur with improper specimen collection / handling, submission of specimen other than nasopharyngeal swab, presence of viral mutation(s) within the areas targeted by this assay, and inadequate number of viral copies (<250 copies / mL). A negative result must be combined with clinical observations, patient history, and epidemiological information.  Fact Sheet for Patients:   StrictlyIdeas.no  Fact Sheet for Healthcare Providers: BankingDealers.co.za  This test is not yet approved or  cleared by the [place] FDA and has been authorized for detection and/or diagnosis  of SARS-CoV-2 by FDA under an Emergency Use Authorization (EUA).  This EUA will remain in effect (meaning this test can be used) for the duration of the COVID-19 declaration under Section 564(b)(1) of the Act, 21 U.S.C. section 360bbb-3(b)(1), unless the authorization is terminated or revoked sooner.  Performed at [facility], [place] [address] [place], [place] [address]   C Difficile Quick Screen w PCR reflex     Status: None   Collection Time: 03/21/20  2:48 PM   Specimen: STOOL  Result Value Ref Range Status   C Diff antigen NEGATIVE NEGATIVE Final   C Diff toxin NEGATIVE NEGATIVE Final   C Diff interpretation No C. difficile detected.  Final    Comment: Performed at [facility], [place] [address] [place], [place] [address]      Radiology Studies: No results found.   LOS: 1 day   Antonieta Pert, MD [facility]  03/22/2020, 3:17 PM

## 2020-03-22 NOTE — Plan of Care (Signed)
  Problem: Health Behavior/Discharge Planning: Goal: Ability to manage health-related needs will improve Outcome: Completed/Met   Problem: Nutrition: Goal: Adequate nutrition will be maintained Outcome: Completed/Met   Problem: Coping: Goal: Level of anxiety will decrease Outcome: Completed/Met   Problem: Skin Integrity: Goal: Risk for impaired skin integrity will decrease Outcome: Completed/Met

## 2020-03-23 DIAGNOSIS — E8770 Fluid overload, unspecified: Secondary | ICD-10-CM

## 2020-03-23 LAB — BASIC METABOLIC PANEL
Anion gap: 9 (ref 5–15)
BUN: 42 mg/dL — ABNORMAL HIGH (ref 8–23)
CO2: 19 mmol/L — ABNORMAL LOW (ref 22–32)
Calcium: 7.7 mg/dL — ABNORMAL LOW (ref 8.9–10.3)
Chloride: 112 mmol/L — ABNORMAL HIGH (ref 98–111)
Creatinine, Ser: 3 mg/dL — ABNORMAL HIGH (ref 0.61–1.24)
GFR calc Af Amer: 23 mL/min — ABNORMAL LOW (ref >60)
GFR calc non Af Amer: 20 mL/min — ABNORMAL LOW (ref >60)
Glucose, Bld: 202 mg/dL — ABNORMAL HIGH (ref 70–99)
Potassium: 4.1 mmol/L (ref 3.5–5.1)
Sodium: 140 mmol/L (ref 135–145)

## 2020-03-23 LAB — GLUCOSE, CAPILLARY
Glucose-Capillary: 162 mg/dL — ABNORMAL HIGH (ref 70–99)
Glucose-Capillary: 182 mg/dL — ABNORMAL HIGH (ref 70–99)
Glucose-Capillary: 199 mg/dL — ABNORMAL HIGH (ref 70–99)
Glucose-Capillary: 218 mg/dL — ABNORMAL HIGH (ref 70–99)

## 2020-03-23 LAB — CBC WITH DIFFERENTIAL/PLATELET
Abs Immature Granulocytes: 0.04 10*3/uL (ref 0.00–0.07)
Basophils Absolute: 0 10*3/uL (ref 0.0–0.1)
Basophils Relative: 0 %
Eosinophils Absolute: 0.3 10*3/uL (ref 0.0–0.5)
Eosinophils Relative: 6 %
HCT: 27.9 % — ABNORMAL LOW (ref 39.0–52.0)
Hemoglobin: 9.3 g/dL — ABNORMAL LOW (ref 13.0–17.0)
Immature Granulocytes: 1 %
Lymphocytes Relative: 45 %
Lymphs Abs: 2.1 10*3/uL (ref 0.7–4.0)
MCH: 39.1 pg — ABNORMAL HIGH (ref 26.0–34.0)
MCHC: 33.3 g/dL (ref 30.0–36.0)
MCV: 117.2 fL — ABNORMAL HIGH (ref 80.0–100.0)
Monocytes Absolute: 0.7 10*3/uL (ref 0.1–1.0)
Monocytes Relative: 15 %
Neutro Abs: 1.5 10*3/uL — ABNORMAL LOW (ref 1.7–7.7)
Neutrophils Relative %: 33 %
Platelets: 243 10*3/uL (ref 150–400)
RBC: 2.38 MIL/uL — ABNORMAL LOW (ref 4.22–5.81)
RDW: 15.8 % — ABNORMAL HIGH (ref 11.5–15.5)
WBC: 4.5 10*3/uL (ref 4.0–10.5)
nRBC: 0 % (ref 0.0–0.2)

## 2020-03-23 LAB — T3, FREE: T3, Free: 1.3 pg/mL — ABNORMAL LOW (ref 2.0–4.4)

## 2020-03-23 MED ORDER — SODIUM BICARBONATE 650 MG PO TABS: 650.0000 mg | ORAL_TABLET | Freq: Three times a day (TID) | ORAL | 0 refills | Status: AC

## 2020-03-23 MED ORDER — MINOXIDIL 2.5 MG PO TABS: 2.5000 mg | ORAL_TABLET | Freq: Two times a day (BID) | ORAL | 0 refills | Status: DC

## 2020-03-23 MED ORDER — TORSEMIDE 20 MG PO TABS: 20.0000 mg | ORAL_TABLET | Freq: Every day | ORAL | 0 refills | Status: DC

## 2020-03-23 NOTE — Progress Notes (Signed)
[facility] Progress Note    Assessment/ Plan:   AKI on CKD3, stable: -Baseline creatinine around 1.9-2.1.  CKD etiology likely related to arteriosclerosis along with the fact that he has a solitary kidney from a right nephrectomy -Etiology of his AKI may be related to decreased effective arterial blood volume in the setting of cardiorenal syndrome?  seems that he may have a perfusion issue in the setting of relative hypotension as well as a prerenal picture secondary to diarrhea which has resolved. Regardless, he needs to offload volume so will continue him on a low dose torsemide moving forward. -Urine protein creatinine ratio is 0.5 g -Renal ultrasound without hydronephrosis does have a nonobstructing kidney stone. Has an appt with urology as an outpatient. -Currently no indication for HD -Continue to monitor daily Cr, Dose meds for GFR<15 -Monitor Daily I/Os, Daily weight  -Maintain MAP>65 for optimal renal perfusion.  -Agree with holding ACE-I, avoid further nephrotoxins including NSAIDS, Morphine.  Unless absolutely necessary, avoid CT with contrast and/or MRI with gadolinium.    -ok from nephrology perspective for discharge, will let our office know to set him up with a follow up appointment  Hypervolemia -echo without hfref -torsemide 20mg  daily -sodium restriction - have added daily weights, strict I/O, and fluid restriction  Metastatic RCC, h/o right nephrectomy -agree with holding sunitinib especially if concern for CHF and increase in serum Cr -onc on board  Anion gap metabolic acidosis, improved -Likely secondary to diarrhea -If worsening bicarb and recommend checking a blood gas -agree with sodium bicarbonate tablets for now  Hypoalbuminemia -encouraged protein intake, encouraged supplementation with ensure vs boost  Hypertension, now with relative hypotension: d/c'ed norvasc for now. - decreased minoxidil to 2.5 mg BID, plan to stop this as an  outpatient  Leukopenia/neutropenia -total wbc improved, anc improved  Macrocytic Anemia: -check b12, folate -Transfuse for Hgb<7 g/dL  Diabetes Mellitus -mgmt per primary service  Diarrhea -ruling out cdiff  Gean Quint, MD [facility]  Subjective:    Doing well, no complaints. swelilng better. Breathing is better as well. Diarrhea resolved.   Objective:   BP 116/72 (BP Location: Left Arm)   Pulse 61   Temp 97.8 F (36.6 C) (Oral)   Resp 15   Ht 5\' 10"  (1.778 m)   Wt 85.1 kg   SpO2 98%   BMI 26.92 kg/m   Intake/Output Summary (Last 24 hours) at 03/23/2020 1625 Last data filed at 03/23/2020 1016 Gross per 24 hour  Intake 660 ml  Output 1040 ml  Net -380 ml   Weight change:   Physical Exam: Gen:nad, sitting up in bed CVS:s1s2, rrr, no m/r/g Resp:cta bl, no w/r/r/c, unlabored, bl chest expansion, speaking in full sentences YHC:WCBJ, nt/nd Ext:2+ pitting edema bl le's up to knees Neuro: speech clear and coherent, moves all extremities spontaneously  Imaging: No results found.  Labs: BMET Recent Labs  Lab 03/17/20 2129 03/19/20 1313 03/19/20 2003 03/20/20 1059 03/21/20 0527 03/22/20 0532 03/23/20 0516  NA 136 138  --  144 140 139 140  K 5.0 5.0  --  3.8 3.5 3.4* 4.1  CL 113* 111  --  120* 115* 115* 112*  CO2 17* 20*  --  14* 16* 17* 19*  GLUCOSE 94 105*  --  129* 76 143* 202*  BUN 43* 45*  --  41* 45* 44* 42*  CREATININE 3.18* 2.82* 2.76* 2.36* 3.14* 3.09* 3.00*  CALCIUM 7.8* 7.9*  --  6.7* 7.7* 7.5* 7.7*  CBC Recent Labs  Lab 03/20/20 1642 03/21/20 0527 03/22/20 0532 03/23/20 0516  WBC 4.0 5.0 4.9 4.5  NEUTROABS 1.4* 2.0 1.9 1.5*  HGB 10.1* 9.8* 9.3* 9.3*  HCT 29.9* 29.7* 28.2* 27.9*  MCV 118.2* 118.3* 117.5* 117.2*  PLT 196 209 206 243    Medications:    . (feeding supplement) PROSource Plus  30 mL Oral BID BM  . acidophilus  1 capsule Oral Daily  . vitamin C  1,000 mg Oral Daily  . aspirin EC  81 mg Oral  Daily  . atenolol  100 mg Oral Daily  . calcium-vitamin D  1 tablet Oral BID  . dorzolamide-timolol  1 drop Both Eyes BID  . famotidine  20 mg Oral Daily  . feeding supplement (KATE FARMS STANDARD 1.4)  325 mL Oral Daily  . ferrous sulfate  325 mg Oral BID WC  . heparin  5,000 Units Subcutaneous Q8H  . insulin aspart  0-9 Units Subcutaneous TID WC  . latanoprost  1 drop Both Eyes QHS  . levothyroxine  300 mcg Oral QAC breakfast  . lipase/protease/amylase  12,000 Units Oral TID with meals  . minoxidil  2.5 mg Oral BID  . multivitamin with minerals  1 tablet Oral Daily  . pravastatin  40 mg Oral q1800  . sodium bicarbonate  650 mg Oral TID  . torsemide  20 mg Oral Daily  . vitamin B-12  2,500 mcg Oral Daily

## 2020-03-23 NOTE — Discharge Summary (Signed)
Physician Discharge Summary  Dylan Jefferson WJX:[id number] DOB: [birth year] DOA: 03/19/2020  PCP: [name], MD  Admit date: 03/19/2020 Discharge date: 03/23/2020  Admitted From: home Disposition:  home  Recommendations for Outpatient Follow-up:  1. Follow up with PCP in 1-2 weeks 2. Please obtain BMP/CBC in one week 3. Please follow up on the following pending results:  Home Health:no  Equipment/Devices: none  Discharge Condition: Stable Code Status:   Code Status: Full Code Diet recommendation:  Diet Order            Diet - low sodium heart healthy           Diet Heart Room service appropriate? Yes; Fluid consistency: Thin; Fluid restriction: 1500 mL Fluid  Diet effective now                  Brief/Interim Summary:69 y.o.malewith medical history significant formetastatic renal carcinoma with right nephrectomy, hypothyroidism, hypertension, diabetes mellitus, renal insufficiency, history of thyroid cancer, hypertension, hyperlipidemia, diabetes mellitus, depression, arthritis comes to the ED for evaluation of chest pain not feeling well. He reports he started to have swelling in legs since end of May and was seeing pcp and was told to increase his protein for low albumin. He was supposed to have nephrology eval and echo as outpatient. He had gained substantial weight, he was placed on lasix 40 mg since July 26 ( stopped his hctz and increased amlodipine to 10 mg bid and he has been peeing and lasix has helped some. He started having shortness of breath DOE since last 2 days. He is having pain all over and on rt mid chest where he has rib " crack" for few months. He was seen in on 8/14 and back again due to same issues. He is having diarrehea with greenish loose stool, since 3 days and is on imodium. Patient otherwise denies any nausea,vomiting,fever, chills, headache, focal weakness, numbness tingling, speech difficulties.  ED Course:Vitals are stable afebrile, heart  rate in 60s to 70s not hypoxic. Work-up shows slightly positive troponin but borderline and elevated BNP in 390s,elevated creatinine,, blood sugarin60.Due to uncontrolled pain and given that patient has had recurrent elevated admission was requested to have him undergo further work-up for renal failure fluid overload. Renal dysfunction and possible CHF and fluid overload issues Patient was admitted underwent further work-up for his renal failure and fluid overload. Hyperkalemia likely multifactorial with hypoalbuminemia, renal dysfunction, cardiorenal syndrome. Echo was done systolic function normal, indeterminate diastolic function. Moderate LVH. Seen by nephrology was diuresed with torsemide with improvement in his leg swelling and creatinine at this time has remained flat. Minoxidil is cut down due to relative hypotension at this time patient is medically stable and is being discharged home,he feels well-he has no pain issues and is ambulatory and did well with PT/OT.  Discharge Diagnoses:  Hypervolemia leg swelling much better continue torsemide renal function tolerating follow-up with PCP and nephrology.Echo shows normal EF with EF 60 to 65%, no regional wall motion maladies, moderate LVH diastolic parameters are indeterminate. His chemo is on hold.He has a left solitary kidney.   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  No more pain.  Continue tramadol.  AKI on CKD stage IIIb; recent creat3.82>3.18>  2.7.> 3.14>  3.0.Creatinine has remained flat likely multifactorial creatinine has been highranging 1.4-1.8 in 2020 and since January of this year he has been ranging in 1.8-2.1. CKD in the setting of  arteriosclerosis, solitary left kidney and AKI likely from cardiorenal syndrome relative hypotension. Renal ultrasound no hydronephrosis on the left, status post right nephrectomy.  Appreciate nephrology input, tolerating torsemide monitor BMP in a.m.  Minimize nephrotoxic medication including ACE/ARBs or NSAIDs. Anion Gap Metabolic acidosis 2/2 aki/ckd- bicarb improving continue bicarb p.o. and  Hypokalemia resolved  Diarrhea- Neg C Diff.  Continue supportive care. No issues.  Metastatic renal cell carcinoma history of right nephrectomy.He is on  Sutent at home andcurrently on hold by his oncologist till his renal/cardiac issues sorted out . He'll up with oncology further recommendation  Leukopenia:Has resolved.   Hypothyroidism- FT4 nl. TSH 7.9 slightly up. Cont home Synthroid if still high in 3 wk will need to up his synthroid.    Diabetes mellituswith hypoglycemia in 50s- 60s 8/17 overnight per glycemia resolved. Advised to follow-up of his PCP and continue home insulin regimen and hold Lantus if blood sugar low. Patient reports that he does drop sugar by 100 during night ( he usually has an alarm to wake up during the night to check his sugar/he is to have hypoglycemia alarm that would wake him up but is working his pcp to refill his sensors). Recent Labs  Lab 03/22/20 2024 03/23/20 0037 03/23/20 0502 03/23/20 0738 03/23/20 1206  GLUCAP 189* 162* 182* 199* 218*   HTN:On atenolol, amlodipine 10 mg twice daily and minoxidil. Minoxidil dose has been cut down amlodipine has been switched to once a day. Follow-up with PCP and nephrologist for blood pressure management  Anemia of chronic disease hemoglobin stable at 9 g range.  Is macrocytic,high B12 and  Nl folate  Consults:  Nephrology, hematology  Subjective: Alert awake oriented neck swelling is much better, no shortness of breath no chest pain. He feels well. He feels ready for discharge home today.  Discharge  Exam: Vitals:   03/23/20 0500 03/23/20 1210  BP: 123/68 116/72  Pulse: (!) 57 61  Resp: 17 15  Temp: 97.7 F (36.5 C) 97.8 F (36.6 C)  SpO2: 97% 98%   General: Pt is alert, awake, not in acute distress Cardiovascular: RRR, S1/S2 +, no rubs, no gallops Respiratory: CTA bilaterally, no wheezing, no rhonchi Abdominal: Soft, NT, ND, bowel sounds + Extremities: no edema, no cyanosis  Discharge Instructions  Discharge Instructions    Diet - low sodium heart healthy   Complete by: As directed    Discharge instructions   Complete by: As directed    Check your kidney function BMP in 1 week from PCP or oncology office.  Follow-up with oncology regarding your chemotherapy resumption.  Please call call MD or return to ER for similar or worsening recurring problem that brought you to hospital or if any fever,nausea/vomiting,abdominal pain, uncontrolled pain, chest pain,  shortness of breath or any other alarming symptoms.  Please follow-up your doctor as instructed in a week time and call the office for appointment.  Please avoid alcohol, smoking, or any other illicit substance and maintain healthy habits including taking your regular medications as prescribed.  You were cared for by a hospitalist during your hospital stay. If you have any questions about your discharge medications or the care you received while you were in the hospital after you are discharged, you can call the unit and ask to speak with the hospitalist on  call if the hospitalist that took care of you is not available.  Once you are discharged, your primary care physician will handle any further medical issues. Please note that NO REFILLS for any discharge medications will be authorized once you are discharged, as it is imperative that you return to your primary care physician (or establish a relationship with a primary care physician if you do not have one) for your aftercare needs so that they can reassess your need for  medications and monitor your lab values   Increase activity slowly   Complete by: As directed      Allergies as of 03/23/2020   No Known Allergies     Medication List    STOP taking these medications   furosemide 40 MG tablet Commonly known as: LASIX   sulindac 150 MG tablet Commonly known as: CLINORIL   Sutent 37.5 MG capsule Generic drug: SUNItinib     TAKE these medications   Acidophilus Caps capsule Take 1 capsule by mouth daily.   amLODipine 10 MG tablet Commonly known as: NORVASC take 1 tablet by mouth once daily What changed: Another medication with the same name was removed. Continue taking this medication, and follow the directions you see here.   aspirin 81 MG tablet Take 81 mg by mouth daily.   atenolol 100 MG tablet Commonly known as: TENORMIN Take 100 mg by mouth daily.   augmented betamethasone dipropionate 0.05 % cream Commonly known as: DIPROLENE-AF Apply 1 application topically daily as needed (rash).   calcium citrate-vitamin D 315-200 MG-UNIT tablet Commonly known as: CITRACAL+D Take 2 tablets by mouth 2 (two) times daily.   Creon 6000-19000 units Cpep Generic drug: Pancrelipase (Lip-Prot-Amyl) Take 4 capsules by mouth 3 (three) times daily.   diclofenac Sodium 1 % Gel Commonly known as: VOLTAREN Apply 3 g topically 3 (three) times daily as needed (joint pain).   docusate sodium 100 MG capsule Commonly known as: COLACE Take 100 mg by mouth 2 (two) times daily as needed for mild constipation.   dorzolamide-timolol 22.3-6.8 MG/ML ophthalmic solution Commonly known as: COSOPT Place 1 drop into both eyes 2 (two) times daily.   famotidine 20 MG tablet Commonly known as: PEPCID Take 20 mg by mouth 2 (two) times daily.   ferrous sulfate 325 (65 FE) MG tablet Take 325 mg by mouth 2 (two) times daily with a meal.   Fish Oil 1200 MG Caps Take 1 capsule by mouth daily.   Geritol Complete Tabs Take 1 tablet by mouth daily.   insulin  lispro 100 UNIT/ML injection Commonly known as: HUMALOG Inject 2-4 Units into the skin 3 (three) times daily before meals. Per sliding scale   Joint Health 750-375-30 MG Tabs Generic drug: Glucosamine-MSM-Hyaluronic Acd Take 2 tablets by mouth daily.   latanoprost 0.005 % ophthalmic solution Commonly known as: XALATAN Place 1 drop into both eyes at bedtime.   Levemir 100 UNIT/ML injection Generic drug: insulin detemir Inject 4 Units into the skin in the morning.   levothyroxine 300 MCG tablet Commonly known as: SYNTHROID Take 300 mcg by mouth daily before breakfast.   lovastatin 40 MG tablet Commonly known as: MEVACOR Take 40 mg by mouth at bedtime.   minoxidil 2.5 MG tablet Commonly known as: LONITEN Take 1 tablet (2.5 mg total) by mouth 2 (two) times daily. What changed:   medication strength  how much to take   potassium chloride SA 20 MEQ tablet Commonly known as: KLOR-CON Take 20 mEq by mouth  every evening.   sodium bicarbonate 650 MG tablet Take 1 tablet (650 mg total) by mouth 3 (three) times daily.   torsemide 20 MG tablet Commonly known as: DEMADEX Take 1 tablet (20 mg total) by mouth daily. Start taking on: March 24, 2020   Vitamin B-12 2500 MCG Subl Take 2,500 mcg by mouth daily.   vitamin C 1000 MG tablet Take 1,000 mg by mouth daily.       Follow-up Information    [name], MD Follow up in 1 week(s).   Specialty: Internal Medicine Why: BMP check Contact information: [address] [place] [address] [phone]              No Known Allergies  The results of significant diagnostics from this hospitalization (including imaging, microbiology, ancillary and laboratory) are listed below for reference.    Microbiology: Recent Results (from the past 240 hour(s))  SARS Coronavirus 2 by RT PCR (hospital order, performed in [facility] hospital lab) Nasopharyngeal Nasopharyngeal Swab     Status: None   Collection Time:  03/19/20  1:53 PM   Specimen: Nasopharyngeal Swab  Result Value Ref Range Status   SARS Coronavirus 2 NEGATIVE NEGATIVE Final    Comment: (NOTE) SARS-CoV-2 target nucleic acids are NOT DETECTED.  The SARS-CoV-2 RNA is generally detectable in upper and lower respiratory specimens during the acute phase of infection. The lowest concentration of SARS-CoV-2 viral copies this assay can detect is 250 copies / mL. A negative result does not preclude SARS-CoV-2 infection and should not be used as the sole basis for treatment or other patient management decisions.  A negative result may occur with improper specimen collection / handling, submission of specimen other than nasopharyngeal swab, presence of viral mutation(s) within the areas targeted by this assay, and inadequate number of viral copies (<250 copies / mL). A negative result must be combined with clinical observations, patient history, and epidemiological information.  Fact Sheet for Patients:   StrictlyIdeas.no  Fact Sheet for Healthcare Providers: BankingDealers.co.za  This test is not yet approved or  cleared by the [place] FDA and has been authorized for detection and/or diagnosis of SARS-CoV-2 by FDA under an Emergency Use Authorization (EUA).  This EUA will remain in effect (meaning this test can be used) for the duration of the COVID-19 declaration under Section 564(b)(1) of the Act, 21 U.S.C. section 360bbb-3(b)(1), unless the authorization is terminated or revoked sooner.  Performed at [facility], [place] [address], [place], [place] [address]   C Difficile Quick Screen w PCR reflex     Status: None   Collection Time: 03/21/20  2:48 PM   Specimen: STOOL  Result Value Ref Range Status   C Diff antigen NEGATIVE NEGATIVE Final   C Diff toxin NEGATIVE NEGATIVE Final   C Diff interpretation No C. difficile detected.  Final    Comment: Performed at  [facility], [place] [address] [place], [place] [address]    Procedures/Studies: CT Abdomen Pelvis Wo Contrast  Result Date: 03/06/2020 CLINICAL DATA:  Metastatic renal cell carcinoma. Thyroid cancer. Restaging. EXAM: CT CHEST, ABDOMEN AND PELVIS WITHOUT CONTRAST TECHNIQUE: Multidetector CT imaging of the chest, abdomen and pelvis was performed following the standard protocol without IV contrast. COMPARISON:  08/18/2019 CT chest, abdomen and pelvis. FINDINGS: CT CHEST FINDINGS Cardiovascular: Normal heart size. No significant pericardial effusion/thickening. Left anterior descending and right coronary atherosclerosis. Atherosclerotic nonaneurysmal thoracic aorta. Normal caliber pulmonary arteries. Mediastinum/Nodes: Total thyroidectomy. Unremarkable esophagus.  No pathologically enlarged axillary, mediastinal or hilar lymph nodes, noting limited sensitivity for the detection of hilar adenopathy on this noncontrast study. Lungs/Pleura: No pneumothorax. No right pleural effusion. Small dependent left pleural effusion, new. Sharply marginated bandlike foci of consolidation in the right middle lobe and anterior right lower lobe with associated mild volume loss and distortion, unchanged, compatible with postradiation change. Two stable tiny left lower lobe pulmonary nodules, largest 4 mm (series 6/image 147). No acute consolidative airspace disease, lung masses or new significant pulmonary nodules. Musculoskeletal: Stable ununited pathologic anterior right fourth rib fracture with underlying mixed lytic and sclerotic change. No new focal osseous lesions in the chest. Minimal thoracic spondylosis. New mild anasarca. CT ABDOMEN PELVIS FINDINGS Hepatobiliary: Normal liver size. Stable granulomatous posterior right liver subcentimeter calcification. No liver masses. Cholecystectomy. Stable postsurgical changes from choledochojejunostomy with pneumobilia. Intrahepatic bile ducts are nondilated. 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 Spleen: Surgically absent. Adrenals/Urinary Tract: Normal adrenals. Right nephrectomy with no discrete mass or fluid collection in the right nephrectomy bed. No left hydronephrosis. Nonobstructing 6 mm lower left renal stone. No contour deforming left renal mass. Normal nondistended bladder. Stomach/Bowel: Postsurgical changes from distal gastrectomy with gastrojejunostomy with no acute gastric abnormality. No unexpected small bowel dilatation or definite small bowel wall thickening. Oral contrast transits to the right colon. Appendix not discretely visualized. Collapsed large bowel with stable mild right colonic wall thickening and no acute large bowel wall thickening or acute pericolonic fat stranding. No diverticulosis. Vascular/Lymphatic: Atherosclerotic nonaneurysmal abdominal aorta. No pathologically enlarged lymph nodes in the abdomen or pelvis. Reproductive: Top-normal size prostate. Other: No pneumoperitoneum. New small volume ascites, predominantly in the pelvis. No focal fluid collection. Mild to moderate anasarca is new. Musculoskeletal: No aggressive appearing focal osseous lesions. Mild lumbar spondylosis. IMPRESSION: 1. No new or progressive metastatic disease. Tiny left lower lobe pulmonary nodules are stable. Stable postradiation change in the right mid lung. Stable ununited pathologic anterior right fourth rib fracture with underlying mixed lytic and sclerotic change. 2. Evidence of new third-spacing of fluid including small left pleural effusion, mild to moderate anasarca and small volume ascites. Mild right colonic wall thickening is nonspecific and also probably due to noninflammatory edema. 3. Chronic findings include: Coronary atherosclerosis. Nonobstructing left nephrolithiasis. Aortic Atherosclerosis (ICD10-I70.0). Electronically Signed   By: Ilona Sorrel M.D.   On: 03/06/2020 15:28   DG Chest  2 View  Result Date: 03/19/2020 CLINICAL DATA:  69 year old male with history of chest pain and shortness of breath. EXAM: CHEST - 2 VIEW COMPARISON:  Chest x-ray 03/17/2020. FINDINGS: Linear areas of architectural distortion in the right mid to lower lung. Left lung is clear. No pleural effusions. No pneumothorax. No evidence of pulmonary edema. Heart size is normal. Upper mediastinal contours are within normal limits. IMPRESSION: 1. Linear areas of architectural distortion in the right mid to lower lung likely to reflect areas of evolving postradiation change. No radiographic evidence of acute cardiopulmonary disease. Electronically Signed   By: Vinnie Langton M.D.   On: 03/19/2020 14:02   DG Chest 2 View  Result Date: 03/17/2020 CLINICAL DATA:  Chest pain EXAM: CHEST - 2 VIEW COMPARISON:  06/13/2015, PET-CT 03/22/2018, CT chest 03/06/2020 FINDINGS: Small left pleural effusion with mild basilar atelectasis. Scarring and post treatment change in the right mid lung as demonstrated on prior CT. Borderline cardiomegaly. No pneumothorax. IMPRESSION: Small left pleural effusion with mild basilar atelectasis. Stable scarring and post treatment changes  in the right mid lung. Electronically Signed   By: Donavan Foil M.D.   On: 03/17/2020 21:49   CT Chest Wo Contrast  Result Date: 03/06/2020 CLINICAL DATA:  Metastatic renal cell carcinoma. Thyroid cancer. Restaging. EXAM: CT CHEST, ABDOMEN AND PELVIS WITHOUT CONTRAST TECHNIQUE: Multidetector CT imaging of the chest, abdomen and pelvis was performed following the standard protocol without IV contrast. COMPARISON:  08/18/2019 CT chest, abdomen and pelvis. FINDINGS: CT CHEST FINDINGS Cardiovascular: Normal heart size. No significant pericardial effusion/thickening. Left anterior descending and right coronary atherosclerosis. Atherosclerotic nonaneurysmal thoracic aorta. Normal caliber pulmonary arteries. Mediastinum/Nodes: Total thyroidectomy. Unremarkable  esophagus. No pathologically enlarged axillary, mediastinal or hilar lymph nodes, noting limited sensitivity for the detection of hilar adenopathy on this noncontrast study. Lungs/Pleura: No pneumothorax. No right pleural effusion. Small dependent left pleural effusion, new. Sharply marginated bandlike foci of consolidation in the right middle lobe and anterior right lower lobe with associated mild volume loss and distortion, unchanged, compatible with postradiation change. Two stable tiny left lower lobe pulmonary nodules, largest 4 mm (series 6/image 147). No acute consolidative airspace disease, lung masses or new significant pulmonary nodules. Musculoskeletal: Stable ununited pathologic anterior right fourth rib fracture with underlying mixed lytic and sclerotic change. No new focal osseous lesions in the chest. Minimal thoracic spondylosis. New mild anasarca. CT ABDOMEN PELVIS FINDINGS Hepatobiliary: Normal liver size. Stable granulomatous posterior right liver subcentimeter calcification. No liver masses. Cholecystectomy. Stable postsurgical changes from choledochojejunostomy with pneumobilia. Intrahepatic bile ducts are nondilated. 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 Spleen: Surgically absent. Adrenals/Urinary Tract: Normal adrenals. Right nephrectomy with no discrete mass or fluid collection in the right nephrectomy bed. No left hydronephrosis. Nonobstructing 6 mm lower left renal stone. No contour deforming left renal mass. Normal nondistended bladder. Stomach/Bowel: Postsurgical changes from distal gastrectomy with gastrojejunostomy with no acute gastric abnormality. No unexpected small bowel dilatation or definite small bowel wall thickening. Oral contrast transits to the right colon. Appendix not discretely visualized. Collapsed large bowel with stable mild right colonic wall thickening and no acute large bowel wall thickening or  acute pericolonic fat stranding. No diverticulosis. Vascular/Lymphatic: Atherosclerotic nonaneurysmal abdominal aorta. No pathologically enlarged lymph nodes in the abdomen or pelvis. Reproductive: Top-normal size prostate. Other: No pneumoperitoneum. New small volume ascites, predominantly in the pelvis. No focal fluid collection. Mild to moderate anasarca is new. Musculoskeletal: No aggressive appearing focal osseous lesions. Mild lumbar spondylosis. IMPRESSION: 1. No new or progressive metastatic disease. Tiny left lower lobe pulmonary nodules are stable. Stable postradiation change in the right mid lung. Stable ununited pathologic anterior right fourth rib fracture with underlying mixed lytic and sclerotic change. 2. Evidence of new third-spacing of fluid including small left pleural effusion, mild to moderate anasarca and small volume ascites. Mild right colonic wall thickening is nonspecific and also probably due to noninflammatory edema. 3. Chronic findings include: Coronary atherosclerosis. Nonobstructing left nephrolithiasis. Aortic Atherosclerosis (ICD10-I70.0). Electronically Signed   By: Ilona Sorrel M.D.   On: 03/06/2020 15:28   US RENAL  Result Date: 03/19/2020 CLINICAL DATA:  69 year old male with acute renal insufficiency. Prior right nephrectomy. EXAM: RENAL / URINARY TRACT ULTRASOUND COMPLETE COMPARISON:  Renal ultrasound dated 12/17/2010. FINDINGS: Right Kidney: Nephrectomy. Left Kidney: Renal measurements: 11.6 x 7.0 x 8.3 cm = volume: 336 mL. Normal echogenicity. There is an 11 mm echogenic focus in the inferior pole of the left kidney, likely a nonobstructing calculus. No hydronephrosis. Bladder: Appears normal for degree of  bladder distention. Other: Small free fluid noted within the pelvis. IMPRESSION: 1. Status post prior right nephrectomy. 2. A nonobstructing left renal inferior pole calculus. No hydronephrosis. 3. Small ascites. Electronically Signed   By: Anner Crete M.D.   On:  03/19/2020 18:46   ECHOCARDIOGRAM COMPLETE  Result Date: 03/20/2020    ECHOCARDIOGRAM REPORT   Patient Name:   RODGER GIANGREGORIO Date of Exam: 03/20/2020 Medical Rec #:  [id number]         Height:       70.0 in Accession #:    [id number]        Weight:       205.0 lb Date of Birth:  1951/04/11         BSA:          2.109 m Patient Age:    82 years          BP:           122/68 mmHg Patient Gender: M                 HR:           68 bpm. Exam Location:  Inpatient Procedure: 2D Echo, Cardiac Doppler and Color Doppler Indications:    I50.9* Heart failure (unspecified)  History:        Patient has no prior history of Echocardiogram examinations.                 CHF, Signs/Symptoms:Chest Pain and Dyspnea; Risk                 Factors:Hypertension, Diabetes and Dyslipidemia. Edema.                 Chemotherapy. Metastatic cancer.  Sonographer:    Roseanna Rainbow RDCS Referring Phys: [id number] [facility]  Sonographer Comments: Technically difficult study due to poor echo windows. Limited mobility. IMPRESSIONS  1. Left ventricular ejection fraction, by estimation, is 60 to 65%. The left ventricle has normal function. The left ventricle has no regional wall motion abnormalities. There is moderate left ventricular hypertrophy. Left ventricular diastolic parameters are indeterminate.  2. Right ventricular systolic function is normal. The right ventricular size is normal. Tricuspid regurgitation signal is inadequate for assessing PA pressure.  3. The mitral valve is normal in structure. Trivial mitral valve regurgitation.  4. The aortic valve is tricuspid. Aortic valve regurgitation is not visualized. Mild aortic valve sclerosis is present, with no evidence of aortic valve stenosis. FINDINGS  Left Ventricle: Left ventricular ejection fraction, by estimation, is 60 to 65%. The left ventricle has normal function. The left ventricle has no regional wall motion abnormalities. The left ventricular internal cavity size was normal in size.  There is  moderate left ventricular hypertrophy. Left ventricular diastolic parameters are indeterminate. Right Ventricle: The right ventricular size is normal. No increase in right ventricular wall thickness. Right ventricular systolic function is normal. Tricuspid regurgitation signal is inadequate for assessing PA pressure. Left Atrium: Left atrial size was normal in size. Right Atrium: Right atrial size was normal in size. Pericardium: Trivial pericardial effusion is present. Mitral Valve: The mitral valve is normal in structure. Trivial mitral valve regurgitation. Tricuspid Valve: The tricuspid valve is normal in structure. Tricuspid valve regurgitation is trivial. Aortic Valve: The aortic valve is tricuspid. Aortic valve regurgitation is not visualized. Mild aortic valve sclerosis is present, with no evidence of aortic valve stenosis. Pulmonic Valve: The pulmonic valve was grossly normal. Pulmonic valve regurgitation is  trivial. Aorta: The aortic root is normal in size and structure. IAS/Shunts: The interatrial septum was not well visualized.  LEFT VENTRICLE PLAX 2D LVIDd:         4.40 cm      Diastology LVIDs:         2.80 cm      LV e' lateral:   12.00 cm/s LV PW:         1.60 cm      LV E/e' lateral: 4.3 LV IVS:        1.70 cm      LV e' medial:    5.11 cm/s LVOT diam:     2.30 cm      LV E/e' medial:  10.2 LV SV:         112 LV SV Index:   53 LVOT Area:     4.15 cm  LV Volumes (MOD) LV vol d, MOD A2C: 102.0 ml LV vol d, MOD A4C: 86.4 ml LV vol s, MOD A2C: 33.0 ml LV vol s, MOD A4C: 38.4 ml LV SV MOD A2C:     69.0 ml LV SV MOD A4C:     86.4 ml LV SV MOD BP:      64.9 ml RIGHT VENTRICLE RV S prime:     16.30 cm/s TAPSE (M-mode): 2.4 cm LEFT ATRIUM             Index       RIGHT ATRIUM           Index LA diam:        4.20 cm 1.99 cm/m  RA Area:     12.40 cm LA Vol (A2C):   45.4 ml 21.52 ml/m RA Volume:   22.40 ml  10.62 ml/m LA Vol (A4C):   29.3 ml 13.89 ml/m LA Biplane Vol: 38.9 ml 18.44 ml/m  AORTIC  VALVE LVOT Vmax:   145.00 cm/s LVOT Vmean:  92.400 cm/s LVOT VTI:    0.269 m  AORTA Ao Root diam: 3.50 cm MITRAL VALVE MV Area (PHT): 2.36 cm    SHUNTS MV Decel Time: 322 msec    Systemic VTI:  0.27 m MV E velocity: 52.00 cm/s  Systemic Diam: 2.30 cm MV A velocity: 55.85 cm/s MV E/A ratio:  0.93 Oswaldo Milian MD Electronically signed by Oswaldo Milian MD Signature Date/Time: 03/20/2020/9:00:18 PM    Final     Labs: BNP (last 3 results) Recent Labs    03/19/20 1313  BNP 962.2*   Basic Metabolic Panel: Recent Labs  Lab 03/19/20 1313 03/19/20 1313 03/19/20 2003 03/20/20 1059 03/21/20 0527 03/22/20 0532 03/23/20 0516  NA 138  --   --  144 140 139 140  K 5.0  --   --  3.8 3.5 3.4* 4.1  CL 111  --   --  120* 115* 115* 112*  CO2 20*  --   --  14* 16* 17* 19*  GLUCOSE 105*  --   --  129* 76 143* 202*  BUN 45*  --   --  41* 45* 44* 42*  CREATININE 2.82*   < > 2.76* 2.36* 3.14* 3.09* 3.00*  CALCIUM 7.9*  --   --  6.7* 7.7* 7.5* 7.7*   < > = values in this interval not displayed.   Liver Function Tests: Recent Labs  Lab 03/19/20 1313  AST 38  ALT 23  ALKPHOS 65  BILITOT 1.2  PROT 4.8*  ALBUMIN 2.4*   No results for  input(s): LIPASE, AMYLASE in the last 168 hours. No results for input(s): AMMONIA in the last 168 hours. CBC: Recent Labs  Lab 03/19/20 1313 03/20/20 1642 03/21/20 0527 03/22/20 0532 03/23/20 0516  WBC 1.7* 4.0 5.0 4.9 4.5  NEUTROABS 0.5* 1.4* 2.0 1.9 1.5*  HGB 11.6* 10.1* 9.8* 9.3* 9.3*  HCT 34.9* 29.9* 29.7* 28.2* 27.9*  MCV 116.7* 118.2* 118.3* 117.5* 117.2*  PLT 250 196 209 206 243   Cardiac Enzymes: No results for input(s): CKTOTAL, CKMB, CKMBINDEX, TROPONINI in the last 168 hours. BNP: Invalid input(s): POCBNP CBG: Recent Labs  Lab 03/22/20 2024 03/23/20 0037 03/23/20 0502 03/23/20 0738 03/23/20 1206  GLUCAP 189* 162* 182* 199* 218*   D-Dimer No results for input(s): DDIMER in the last 72 hours. Hgb A1c No results for  input(s): HGBA1C in the last 72 hours. Lipid Profile No results for input(s): CHOL, HDL, LDLCALC, TRIG, CHOLHDL, LDLDIRECT in the last 72 hours. Thyroid function studies Recent Labs    03/22/20 0532  T3FREE 1.3*   Anemia work up Recent Labs    03/22/20 0532  VITAMINB12 3,200*  FOLATE 23.0   Urinalysis    Component Value Date/Time   COLORURINE YELLOW 03/21/2020 [place] 03/21/2020 1712   LABSPEC 1.010 03/21/2020 1712   LABSPEC 1.020 08/16/2015 0906   PHURINE 5.0 03/21/2020 1712   GLUCOSEU NEGATIVE 03/21/2020 1712   GLUCOSEU Negative 08/16/2015 0906   HGBUR NEGATIVE 03/21/2020 1712   BILIRUBINUR NEGATIVE 03/21/2020 1712   BILIRUBINUR Negative 08/16/2015 0906   KETONESUR NEGATIVE 03/21/2020 1712   PROTEINUR 30 (A) 03/21/2020 1712   UROBILINOGEN 0.2 08/16/2015 0906   NITRITE NEGATIVE 03/21/2020 1712   LEUKOCYTESUR NEGATIVE 03/21/2020 1712   LEUKOCYTESUR Negative 08/16/2015 0906   Sepsis Labs Invalid input(s): PROCALCITONIN,  WBC,  LACTICIDVEN Microbiology Recent Results (from the past 240 hour(s))  SARS Coronavirus 2 by RT PCR (hospital order, performed in [place] hospital lab) Nasopharyngeal Nasopharyngeal Swab     Status: None   Collection Time: 03/19/20  1:53 PM   Specimen: Nasopharyngeal Swab  Result Value Ref Range Status   SARS Coronavirus 2 NEGATIVE NEGATIVE Final    Comment: (NOTE) SARS-CoV-2 target nucleic acids are NOT DETECTED.  The SARS-CoV-2 RNA is generally detectable in upper and lower respiratory specimens during the acute phase of infection. The lowest concentration of SARS-CoV-2 viral copies this assay can detect is 250 copies / mL. A negative result does not preclude SARS-CoV-2 infection and should not be used as the sole basis for treatment or other patient management decisions.  A negative result may occur with improper specimen collection / handling, submission of specimen other than nasopharyngeal swab, presence of viral  mutation(s) within the areas targeted by this assay, and inadequate number of viral copies (<250 copies / mL). A negative result must be combined with clinical observations, patient history, and epidemiological information.  Fact Sheet for Patients:   StrictlyIdeas.no  Fact Sheet for Healthcare Providers: BankingDealers.co.za  This test is not yet approved or  cleared by the [place] FDA and has been authorized for detection and/or diagnosis of SARS-CoV-2 by FDA under an Emergency Use Authorization (EUA).  This EUA will remain in effect (meaning this test can be used) for the duration of the COVID-19 declaration under Section 564(b)(1) of the Act, 21 U.S.C. section 360bbb-3(b)(1), unless the authorization is terminated or revoked sooner.  Performed at [facility], [place] [address], [place], [place] [address]   C Difficile Quick Screen  w PCR reflex     Status: None   Collection Time: 03/21/20  2:48 PM   Specimen: STOOL  Result Value Ref Range Status   C Diff antigen NEGATIVE NEGATIVE Final   C Diff toxin NEGATIVE NEGATIVE Final   C Diff interpretation No C. difficile detected.  Final    Comment: Performed at [facility], [place] [address], [place], [place] [address]     Time coordinating discharge: 25 minutes  SIGNED: Antonieta Pert, MD  [facility] 03/23/2020, 2:49 PM  If 7PM-7AM, please contact night-coverage [email or web address]

## 2020-03-23 NOTE — Care Management Important Message (Signed)
Important Message  Patient Details IM Letter given to the Patient Name: Dylan Jefferson MRN: [id number] Date of Birth: Dec 24, 1950   Medicare Important Message Given:  Yes     Kerin Salen 03/23/2020, 11:19 AM

## 2020-03-26 ENCOUNTER — Telehealth: Payer: Self-pay

## 2020-03-26 NOTE — Telephone Encounter (Signed)
TC from above Pt's wife stating Pt was discharged from the hospital and would like to know when he is to start taking Sutent again. Informed Pt's wife that per Dr. Irene Limbo chemo medication is on hold for now. Pt.'s wife verbalized understanding, No further problems or concerns noted.

## 2020-04-25 NOTE — Progress Notes (Signed)
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 2) Remote history of metastatic renal cell carcinoma Diagnosed with renal cell carcinoma 20 years ago and had a right nephrectomy. About 8 years after that he was noted to have abdominal recurrence in his pancreas spleen and small intestine and had a Whipple's procedure and significant abdominal surgery and notes that 10 out of 17 lymph nodes were positive. He also had his gallbladder removed. Postoperative course was complicated by an internal hemorrhage as per his report. Patient notes 2-3 years after that he had recurrence in his thyroid that led to a thyroidectomy. [June 2004] later he had partial gastrectomy for local recurrence. [February 2005] when he presented with GI bleeding. Patient notes that he has had no known evidence of recurrence over the last 8 years until his recent CT scan showed lung nodules.  Current Treatment: Sutent 37.5 mg po daily for 2 weeks on and 1 weeks off.  Previous treatment Sutent on cycle 1 - 50 mg by mouth daily for 4 weeks on and 2-weeks off. It was dose adjusted to 37.5 mg by mouth daily for 2 weeks with 1 week of to help mitigate issues with fatigue, mild hyperbilirubinemia, cytopenias. SBRT to dominant RML nodule.   INTERVAL HISTORY: Dylan Jefferson is here for for his scheduled follow-up of his metastatic renal cell carcinoma. Pt is joined by his wife. The patient's last visit with [place] was on 03/15/2020. The pt reports that he is doing well overall.  The pt reports that his Rheumatoid arthritis has become more bothersome. He is not currently on  any medications to control his RA. Pt is taking more Tramadol to control the discomfort from his arthritis. Pt is also using a significant amount of Voltaren gel. His shoulders are very stiff resulting in a low range of mobility. His next next appointment with his Rheumatologist is in February.   Pt has a testicular lump and plans to have this evaluated with Dr. Alinda Money, Urologist, at his appointment in November.   His leg swelling has improved. Pt is eating and drinking relatively well. He is drinking 1/2 gallon of water and two Boost supplements per day. His blood glucose is well controlled during the day, but drops then spikes at night.   Of note since the patient's last visit, pt has had US Renal ([id number]) completed on 03/19/2020 with results revealing "1. Status post prior right nephrectomy. 2. A nonobstructing left renal inferior pole calculus. No hydronephrosis. 3. Small ascites."  Pt has had ECHO completed on 03/20/2020 with results revealing "1. Left ventricular ejection fraction, by estimation, is 60 to 65%. The left ventricle has normal function. The left ventricle has no regional wall motion abnormalities. There is moderate left ventricular hypertrophy. Left ventricular diastolic parameters are indeterminate. 2. Right ventricular systolic function is normal. The right ventricular size is normal. Tricuspid regurgitation signal is inadequate for assessing PA pressure. 3. The mitral valve is normal in structure. Trivial mitral valve regurgitation. 4. The aortic valve is tricuspid. Aortic valve regurgitation is not visualized. Mild aortic valve sclerosis is present, with no evidence of aortic valve stenosis."  Lab results today (04/26/20) of CBC w/diff and CMP  is as follows: all values are WNL except for RBC at 3.31, Hgb at 12.4, HCT at 37.7, MCV at 113.9, MCH at 37.5, Mono Abs at 1.2K, Glucose at 201, BUN at 38, Creatinine at 1.97, Total Protein at 6.3, Albumin at 2.7, GFR Est Non Af  Am. 04/26/2020 Sed Rate at 51 04/26/2020 Rheumatoid factor <10 04/26/2020 Prothrombin time at 12.6, INR at 1.0  On review of systems, pt reports joint pain, improved leg swelling, eating well and denies abdominal pain and any other symptoms.   Past Medical History:  Diagnosis Date  . Arthritis   . Cancer [facility])    Renal cell  . DDD (degenerative disc disease), cervical 05/28/2016  . DDD (degenerative disc disease), lumbar 05/28/2016  . Depression   . Diabetes ([place])   . Hyperlipidemia 05/28/2016  . Hypertension   . Hypothyroidism   . 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 Kidney disease   . Nephrolithiasis   . Osteoarthritis of both hands 05/28/2016  . Osteoarthritis of both knees 05/28/2016  . Peripheral vascular disorder ([place]) 05/28/2016  . 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 Rheumatoid arthritis ([place])   . Thyroid cancer ([place]) 05/28/2016    . Past Surgical History:  Procedure Laterality Date  . CATARACT EXTRACTION Bilateral 2013  . CHOLECYSTECTOMY    . GASTRECTOMY     tumor removed  . LIPOMA EXCISION Right 1999  . NEPHRECTOMY Right   . PANCREATECTOMY    . SPLENECTOMY    . THYROIDECTOMY    . URETERAL STENT PLACEMENT Left 2012  . WHIPPLE PROCEDURE      . Social History   Tobacco Use  . Smoking status: Former Smoker    Years: 1.50  . Smokeless tobacco: Never Used  Vaping Use  . Vaping Use: Never used  Substance Use Topics  . Alcohol use: Yes    Comment: occasional beer  . Drug use: No    ALLERGIES:  has No Known Allergies.  MEDICATIONS:  Current Outpatient Medications  Medication Sig Dispense Refill  . Multiple Vitamins-Minerals (PRESERVISION AREDS 2+MULTI VIT PO) Take 1 tablet by mouth in the morning and at bedtime.    Marland Kitchen amLODipine (NORVASC) 10 MG tablet take 1 tablet by mouth once daily (Patient not taking: Reported on 03/31/2018) 90 tablet 3  . Ascorbic Acid (VITAMIN C) 1000 MG tablet Take 1,000 mg  by mouth daily.    Marland Kitchen aspirin 81 MG tablet Take 81 mg by mouth daily.     Marland Kitchen atenolol (TENORMIN) 100 MG tablet Take 100 mg by mouth daily.     Marland Kitchen augmented betamethasone dipropionate (DIPROLENE-AF) 0.05 % cream Apply 1 application topically daily as needed (rash).   0  . calcium citrate-vitamin D (CITRACAL+D) 315-200 MG-UNIT per tablet Take 2 tablets by mouth 2 (two) times daily.     . Cyanocobalamin (VITAMIN B-12) 2500 MCG SUBL Take 2,500 mcg by mouth daily.     . diclofenac Sodium (VOLTAREN) 1 % GEL Apply 3 g topically 3 (three) times daily as needed (joint pain).     Marland Kitchen docusate sodium (COLACE) 100 MG capsule Take 100 mg by mouth 2 (two) times daily as needed for mild constipation.     . dorzolamide-timolol (COSOPT) 22.3-6.8 MG/ML ophthalmic solution Place 1 drop into both eyes 2 (two) times daily.   1  . famotidine (PEPCID) 20 MG tablet Take 20 mg by mouth 2 (two) times daily.    . ferrous sulfate 325 (65  FE) MG tablet Take 325 mg by mouth 2 (two) times daily with a meal.     . Glucosamine-MSM-Hyaluronic Acd (JOINT HEALTH) 750-375-30 MG TABS Take 2 tablets by mouth daily.    . insulin lispro (HUMALOG) 100 UNIT/ML injection Inject 2-4 Units into the skin 3 (three) times daily before meals. Per sliding scale    . Iron-Vitamins (GERITOL COMPLETE) TABS Take 1 tablet by mouth daily.    . Lactobacillus (ACIDOPHILUS) CAPS capsule Take 1 capsule by mouth daily.    Marland Kitchen latanoprost (XALATAN) 0.005 % ophthalmic solution Place 1 drop into both eyes at bedtime.   1  . LEVEMIR 100 UNIT/ML injection Inject 4 Units into the skin in the morning.   1  . levothyroxine (SYNTHROID, LEVOTHROID) 300 MCG tablet Take 300 mcg by mouth daily before breakfast.     . lovastatin (MEVACOR) 40 MG tablet Take 40 mg by mouth at bedtime.     . minoxidil (LONITEN) 2.5 MG tablet Take 1 tablet (2.5 mg total) by mouth 2 (two) times daily. 60 tablet 0  . Omega-3 Fatty Acids (FISH OIL) 1200 MG CAPS Take 1 capsule by mouth daily.    .  Pancrelipase, Lip-Prot-Amyl, (CREON) 6000 UNITS CPEP Take 4 capsules by mouth 3 (three) times daily.    Marland Kitchen torsemide (DEMADEX) 20 MG tablet Take 1 tablet (20 mg total) by mouth daily. 30 tablet 0   No current facility-administered medications for this visit.    REVIEW OF SYSTEMS:   A 10+ POINT REVIEW OF SYSTEMS WAS OBTAINED including neurology, dermatology, psychiatry, cardiac, respiratory, lymph, extremities, GI, GU, Musculoskeletal, constitutional, breasts, reproductive, HEENT.  All pertinent positives are noted in the HPI.  All others are negative.   PHYSICAL EXAMINATION: ECOG FS:1 - Symptomatic but completely ambulatory  Vitals:   04/26/20 0902  BP: (!) 154/76  Pulse: (!) 51  Resp: 18  Temp: (!) 95.9 F (35.5 C)  SpO2: 97%   Wt Readings from Last 3 Encounters:  04/26/20 183 lb 14.4 oz (83.4 kg)  03/23/20 187 lb 9.8 oz (85.1 kg)  03/15/20 205 lb 1.6 oz (93 kg)   Body mass index is 26.39 kg/m.      GENERAL:alert, in no acute distress and comfortable SKIN: no acute rashes, no significant lesions EYES: conjunctiva are pink and non-injected, sclera anicteric OROPHARYNX: MMM, no exudates, no oropharyngeal erythema or ulceration NECK: supple, no JVD LYMPH:  no palpable lymphadenopathy in the cervical, axillary or inguinal regions LUNGS: clear to auscultation b/l with normal respiratory effort HEART: regular rate & rhythm ABDOMEN:  normoactive bowel sounds , non tender, not distended. No palpable hepatosplenomegaly.  Extremity: no pedal edema PSYCH: alert & oriented x 3 with fluent speech NEURO: no focal motor/sensory deficits  LABORATORY DATA:   I have reviewed the data as listed  . CBC Latest Ref Rng & Units 04/26/2020 03/23/2020 03/22/2020  WBC 4.0 - 10.5 K/uL 9.0 4.5 4.9  Hemoglobin 13.0 - 17.0 g/dL 12.4(L) 9.3(L) 9.3(L)  Hematocrit 39 - 52 % 37.7(L) 27.9(L) 28.2(L)  Platelets 150 - 400 K/uL 398 243 206  ANC 1.4k . CBC    Component Value Date/Time   WBC 9.0  04/26/2020 0817   RBC 3.31 (L) 04/26/2020 0817   HGB 12.4 (L) 04/26/2020 0817   HGB 13.3 02/28/2019 1243   HGB 14.0 06/11/2017 0808   HCT 37.7 (L) 04/26/2020 0817   HCT 40.7 06/11/2017 0808   PLT 398 04/26/2020 0817   PLT 173 02/28/2019 1243  PLT 223 06/11/2017 0808   MCV 113.9 (H) 04/26/2020 0817   MCV 112.1 (H) 06/11/2017 0808   MCH 37.5 (H) 04/26/2020 0817   MCHC 32.9 04/26/2020 0817   RDW 12.4 04/26/2020 0817   RDW 13.9 06/11/2017 0808   LYMPHSABS 1.2 04/26/2020 0817   LYMPHSABS 1.6 06/11/2017 0808   MONOABS 1.2 (H) 04/26/2020 0817   MONOABS 0.5 06/11/2017 0808   EOSABS 0.3 04/26/2020 0817   EOSABS 0.1 06/11/2017 0808   BASOSABS 0.1 04/26/2020 0817   BASOSABS 0.0 06/11/2017 0808   . CMP Latest Ref Rng & Units 04/26/2020 03/23/2020 03/22/2020  Glucose 70 - 99 mg/dL 201(H) 202(H) 143(H)  BUN 8 - 23 mg/dL 38(H) 42(H) 44(H)  Creatinine 0.61 - 1.24 mg/dL 1.97(H) 3.00(H) 3.09(H)  Sodium 135 - 145 mmol/L 139 140 139  Potassium 3.5 - 5.1 mmol/L 4.0 4.1 3.4(L)  Chloride 98 - 111 mmol/L 105 112(H) 115(H)  CO2 22 - 32 mmol/L 28 19(L) 17(L)  Calcium 8.9 - 10.3 mg/dL 9.1 7.7(L) 7.5(L)  Total Protein 6.5 - 8.1 g/dL 6.3(L) - -  Total Bilirubin 0.3 - 1.2 mg/dL 0.6 - -  Alkaline Phos 38 - 126 U/L 115 - -  AST 15 - 41 U/L 30 - -  ALT 0 - 44 U/L 34 - -   08/18/2019 CT Abdomen Pelvis Wo Contrast (Accession [id number])   08/18/2019 CT Chest Wo Contrast (Accession [id number])   RADIOGRAPHIC STUDIES: I have personally reviewed the radiological images as listed and agreed with the findings in the report. No results found.   ASSESSMENT & PLAN:   69 y.o. Caucasian male with  #1 Recurrent metastatic Renal cell carcinoma with multiple lung metastases.  Has been on stable on 1st line Sutent for >58yr. Has had SBRT to the dominant right lung nodule with partial response. No lab or clinical evidence of metastatic RCC progression at this time.  PET/CT 08/08/2016 with a couple of mildly  enlarge LLL pulmonary nodules - no other overt evidence of RCC progression at this time.  PET CT scan 11/06/2016 - no overt evidence of RCC progression at this time .  PET/CT 03/13/2017 - no overt evidence of RCC progression at this time .  PET/CT 08/10/2017 - no overt evidence of RCC progression at this time .  PET/ CT done 08/10/2017, shows no evidence of  Progression at this time.  12/07/17 PET which revealed No findings suspicious for recurrent or metastatic disease.  03/22/18 PET/CT revealed One of the left lower lobe nodules has very minimally enlarged over the last 2 years, currently 0.6 by 0.4 cm and measuring 0.4 by 0.3 cm on 03/20/2016. This probably represents a previously treated metastatic nodule given that it measured 1.0 by 0.8 cm on 06/06/2015. This nodule may be subject to some slice selection phenomenon and also motion artifact given proximity to the diaphragm. Surveillance is likely warranted. 2. Chronic central lucency, marginal bony deposition, and slow healing of the right lateral fourth rib fracture. Possibilities may include underlying radiation necrosis as a cause for fracture leading to slow healing, or a low-grade pathologic lesion. Maximum SUV at this fracture site is 2.9, previously 2.4. 3. 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 There is only low-grade activity in this vicinity characteristic of radiation fibrosis, without focal activity. 4. Other imaging findings of potential clinical significance: Aortic Atherosclerosis. Coronary atherosclerosis. Mild cardiomegaly. Right nephrectomy. 8 mm nonobstructive calculus in the  left kidney lower pole.   08/24/18 CT C/A/P revealed "Stable small left lower lobe pulmonary nodules from recent prior studies. As previously noted, these have decreased in size from PET-CT 06/06/2015, likely treated metastases. No new or enlarging pulmonary nodules. 2. Postsurgical  changes in the upper abdomen without evidence of local recurrence or metastatic disease. 3. Nonobstructing left renal calculus. 4. Stable radiation changes in the right upper lobe with probable chronic pathologic fracture of the right 4th rib laterally."  03/07/2019 CT C/A wo contrast revealed "1. Stable left lower lobe pulmonary nodules, likely treated Metastases. 2. No additional evidence of metastatic disease. 3. Left renal stone. 4. Post radiation scarring in the right hemithorax with a healed adjacent right rib fracture which is presumably pathologic in Etiology. 5.  Aortic atherosclerosis (ICD10-170.0). 6. 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 Confirmed by [place].  #4 mild thrombocytopenia likely related to Sutent- resolved with dose reduction. #5 Abnormal Liver function test - significant bump in transaminases. -resolved on labs today #6 grade 1 fatigue due to Sutent -better with dose reduction and changing schedule to 2 week on and 1 week. #7 Dysguesia from Sutent - manageable per patient. He is trying to eat as well as he can and is maintaining his body weight.  #8 RBC macrocytosis due to Sutent #9 chronic kidney disease/single kidney - creatinine stable @ 1.5-1.9  Recommended to avoid NSAIDS and other nephrotoxic medications. Optimize control of DM2 #10 rheumatoid arthritis -follows with Dr. Estanislado Pandy for mx #11 DM2 -continue f/u with Dr Philip Aspen for continue mx, blood sugar at home per patient.    PLAN: -Discussed pt labwork today, 04/26/20; blood counts and chemistries have improved.  -Discussed 04/26/2020 Sed Rate is significantly elevated -Discussed 04/26/2020 Rheumatoid factor is in progress -Discussed 04/26/2020 Prothrombin time at 12.6, INR at 1.0 -Discussed 03/20/2020 ECHO revealed nml ejection fraction.   -No lab or clinical evidence of renal cell  carcinoma recurrence at this time. Will continue watchful observation.  -Advised pt that excessive use of Voltaren cream can increase absorption, affecting his kidneys and liver. -Advised pt that he had proteinuria, but was not in nephrotic-range, according to 03/19/20 24 hr UPEP.  -Advised pt that Diabetes, Rheumatoid Arthritis, and to a smaller extent certain medications, can cause leaky kidneys.  -Advised pt that a short-course of Prednisone may be needed to control RA temporarily, but blood sugars would need to be controlled.  -Will continue to hold Sutent until other medical conditions are stabilized.  -Recommend pt f/u with Dr. Candiss Norse for diuretic management and kidney monitoring. -Recommend pt f/u with Dr. Philip Aspen for Diabetes management. -Recommend pt f/u with Dr. Estanislado Pandy for RA management. -Recommend pt f/u with Dr. Alinda Money as scheduled -Recommend begin 2000 IU Vitamin D daily   -Will see back in 2 months with labs     FOLLOW UP: RTC with Dr Irene Limbo with labs in 2 months   The total time spent in the appt was 30 minutes and more than 50% was on counseling and direct patient cares.  All of the patient's questions were answered with apparent satisfaction. The patient knows to call the clinic with any problems, questions or concerns.   Sullivan Lone MD [place] AAHIVMS [facility] Hematology/Oncology Physician [facility]  (Office):       [phone] (Work cell):  [phone] (Fax):           [phone]  I, Yevette Edwards, am acting as a Education administrator for Dr. Sullivan Lone.   .I have reviewed the above documentation for accuracy and completeness, and I agree with the above. Brunetta Genera MD

## 2020-04-26 ENCOUNTER — Other Ambulatory Visit: Payer: Self-pay

## 2020-04-26 ENCOUNTER — Inpatient Hospital Stay: Payer: Medicare Other

## 2020-04-26 ENCOUNTER — Inpatient Hospital Stay: Payer: Medicare Other | Attending: Hematology | Admitting: Hematology

## 2020-04-26 VITALS — BP 154/76 | HR 51 | Temp 95.9°F | Resp 18 | Ht 70.0 in | Wt 183.9 lb

## 2020-04-26 DIAGNOSIS — I10 Essential (primary) hypertension: Secondary | ICD-10-CM | POA: Diagnosis not present

## 2020-04-26 DIAGNOSIS — C641 Malignant neoplasm of right kidney, except renal pelvis: Secondary | ICD-10-CM | POA: Insufficient documentation

## 2020-04-26 DIAGNOSIS — Z905 Acquired absence of kidney: Secondary | ICD-10-CM | POA: Insufficient documentation

## 2020-04-26 DIAGNOSIS — N5089 Other specified disorders of the male genital organs: Secondary | ICD-10-CM | POA: Insufficient documentation

## 2020-04-26 DIAGNOSIS — C649 Malignant neoplasm of unspecified kidney, except renal pelvis: Secondary | ICD-10-CM

## 2020-04-26 DIAGNOSIS — C7800 Secondary malignant neoplasm of unspecified lung: Secondary | ICD-10-CM | POA: Diagnosis present

## 2020-04-26 DIAGNOSIS — R601 Generalized edema: Secondary | ICD-10-CM

## 2020-04-26 DIAGNOSIS — Z7982 Long term (current) use of aspirin: Secondary | ICD-10-CM | POA: Diagnosis not present

## 2020-04-26 DIAGNOSIS — E119 Type 2 diabetes mellitus without complications: Secondary | ICD-10-CM | POA: Diagnosis not present

## 2020-04-26 DIAGNOSIS — M069 Rheumatoid arthritis, unspecified: Secondary | ICD-10-CM | POA: Diagnosis not present

## 2020-04-26 DIAGNOSIS — Z79899 Other long term (current) drug therapy: Secondary | ICD-10-CM | POA: Insufficient documentation

## 2020-04-26 DIAGNOSIS — N184 Chronic kidney disease, stage 4 (severe): Secondary | ICD-10-CM

## 2020-04-26 DIAGNOSIS — E539 Vitamin B deficiency, unspecified: Secondary | ICD-10-CM | POA: Insufficient documentation

## 2020-04-26 DIAGNOSIS — Z87891 Personal history of nicotine dependence: Secondary | ICD-10-CM | POA: Diagnosis not present

## 2020-04-26 LAB — CBC WITH DIFFERENTIAL/PLATELET
Abs Immature Granulocytes: 0.04 10*3/uL (ref 0.00–0.07)
Basophils Absolute: 0.1 10*3/uL (ref 0.0–0.1)
Basophils Relative: 1 %
Eosinophils Absolute: 0.3 10*3/uL (ref 0.0–0.5)
Eosinophils Relative: 3 %
HCT: 37.7 % — ABNORMAL LOW (ref 39.0–52.0)
Hemoglobin: 12.4 g/dL — ABNORMAL LOW (ref 13.0–17.0)
Immature Granulocytes: 0 %
Lymphocytes Relative: 13 %
Lymphs Abs: 1.2 10*3/uL (ref 0.7–4.0)
MCH: 37.5 pg — ABNORMAL HIGH (ref 26.0–34.0)
MCHC: 32.9 g/dL (ref 30.0–36.0)
MCV: 113.9 fL — ABNORMAL HIGH (ref 80.0–100.0)
Monocytes Absolute: 1.2 10*3/uL — ABNORMAL HIGH (ref 0.1–1.0)
Monocytes Relative: 13 %
Neutro Abs: 6.1 10*3/uL (ref 1.7–7.7)
Neutrophils Relative %: 70 %
Platelets: 398 10*3/uL (ref 150–400)
RBC: 3.31 MIL/uL — ABNORMAL LOW (ref 4.22–5.81)
RDW: 12.4 % (ref 11.5–15.5)
WBC: 9 10*3/uL (ref 4.0–10.5)
nRBC: 0 % (ref 0.0–0.2)

## 2020-04-26 LAB — CMP (CANCER CENTER ONLY)
ALT: 34 U/L (ref 0–44)
AST: 30 U/L (ref 15–41)
Albumin: 2.7 g/dL — ABNORMAL LOW (ref 3.5–5.0)
Alkaline Phosphatase: 115 U/L (ref 38–126)
Anion gap: 6 (ref 5–15)
BUN: 38 mg/dL — ABNORMAL HIGH (ref 8–23)
CO2: 28 mmol/L (ref 22–32)
Calcium: 9.1 mg/dL (ref 8.9–10.3)
Chloride: 105 mmol/L (ref 98–111)
Creatinine: 1.97 mg/dL — ABNORMAL HIGH (ref 0.61–1.24)
GFR, Est AFR Am: 39 mL/min — ABNORMAL LOW (ref >60)
GFR, Estimated: 33 mL/min — ABNORMAL LOW (ref >60)
Glucose, Bld: 201 mg/dL — ABNORMAL HIGH (ref 70–99)
Potassium: 4 mmol/L (ref 3.5–5.1)
Sodium: 139 mmol/L (ref 135–145)
Total Bilirubin: 0.6 mg/dL (ref 0.3–1.2)
Total Protein: 6.3 g/dL — ABNORMAL LOW (ref 6.5–8.1)

## 2020-04-26 LAB — PROTIME-INR
INR: 1 (ref 0.8–1.2)
Prothrombin Time: 12.6 seconds (ref 11.4–15.2)

## 2020-04-26 LAB — SEDIMENTATION RATE: Sed Rate: 51 mm/hr — ABNORMAL HIGH (ref 0–16)

## 2020-04-27 LAB — RHEUMATOID FACTOR: Rheumatoid fact SerPl-aCnc: <10 IU/mL (ref 0.0–13.9)

## 2020-06-26 ENCOUNTER — Other Ambulatory Visit: Payer: Self-pay

## 2020-06-26 ENCOUNTER — Inpatient Hospital Stay: Payer: Medicare Other | Attending: Hematology

## 2020-06-26 ENCOUNTER — Inpatient Hospital Stay: Payer: Medicare Other

## 2020-06-26 ENCOUNTER — Inpatient Hospital Stay: Payer: Medicare Other | Admitting: Hematology

## 2020-06-26 VITALS — BP 121/68 | HR 45 | Temp 97.0°F | Resp 18 | Ht 70.0 in | Wt 179.0 lb

## 2020-06-26 DIAGNOSIS — Z23 Encounter for immunization: Secondary | ICD-10-CM | POA: Insufficient documentation

## 2020-06-26 DIAGNOSIS — Z905 Acquired absence of kidney: Secondary | ICD-10-CM | POA: Diagnosis not present

## 2020-06-26 DIAGNOSIS — C649 Malignant neoplasm of unspecified kidney, except renal pelvis: Secondary | ICD-10-CM | POA: Diagnosis not present

## 2020-06-26 DIAGNOSIS — E785 Hyperlipidemia, unspecified: Secondary | ICD-10-CM | POA: Diagnosis not present

## 2020-06-26 DIAGNOSIS — Z87891 Personal history of nicotine dependence: Secondary | ICD-10-CM | POA: Insufficient documentation

## 2020-06-26 DIAGNOSIS — E039 Hypothyroidism, unspecified: Secondary | ICD-10-CM | POA: Insufficient documentation

## 2020-06-26 DIAGNOSIS — Z79899 Other long term (current) drug therapy: Secondary | ICD-10-CM | POA: Insufficient documentation

## 2020-06-26 DIAGNOSIS — C7800 Secondary malignant neoplasm of unspecified lung: Secondary | ICD-10-CM | POA: Insufficient documentation

## 2020-06-26 DIAGNOSIS — E119 Type 2 diabetes mellitus without complications: Secondary | ICD-10-CM | POA: Diagnosis not present

## 2020-06-26 DIAGNOSIS — C641 Malignant neoplasm of right kidney, except renal pelvis: Secondary | ICD-10-CM | POA: Insufficient documentation

## 2020-06-26 DIAGNOSIS — M7989 Other specified soft tissue disorders: Secondary | ICD-10-CM

## 2020-06-26 DIAGNOSIS — M069 Rheumatoid arthritis, unspecified: Secondary | ICD-10-CM | POA: Diagnosis not present

## 2020-06-26 DIAGNOSIS — I10 Essential (primary) hypertension: Secondary | ICD-10-CM | POA: Insufficient documentation

## 2020-06-26 DIAGNOSIS — F32A Depression, unspecified: Secondary | ICD-10-CM | POA: Insufficient documentation

## 2020-06-26 DIAGNOSIS — Z7982 Long term (current) use of aspirin: Secondary | ICD-10-CM | POA: Diagnosis not present

## 2020-06-26 DIAGNOSIS — Z85850 Personal history of malignant neoplasm of thyroid: Secondary | ICD-10-CM | POA: Insufficient documentation

## 2020-06-26 LAB — CMP (CANCER CENTER ONLY)
ALT: 59 U/L — ABNORMAL HIGH (ref 0–44)
AST: 42 U/L — ABNORMAL HIGH (ref 15–41)
Albumin: 3.2 g/dL — ABNORMAL LOW (ref 3.5–5.0)
Alkaline Phosphatase: 133 U/L — ABNORMAL HIGH (ref 38–126)
Anion gap: 6 (ref 5–15)
BUN: 25 mg/dL — ABNORMAL HIGH (ref 8–23)
CO2: 25 mmol/L (ref 22–32)
Calcium: 9 mg/dL (ref 8.9–10.3)
Chloride: 113 mmol/L — ABNORMAL HIGH (ref 98–111)
Creatinine: 1.85 mg/dL — ABNORMAL HIGH (ref 0.61–1.24)
GFR, Estimated: 39 mL/min — ABNORMAL LOW (ref >60)
Glucose, Bld: 109 mg/dL — ABNORMAL HIGH (ref 70–99)
Potassium: 4 mmol/L (ref 3.5–5.1)
Sodium: 144 mmol/L (ref 135–145)
Total Bilirubin: 0.6 mg/dL (ref 0.3–1.2)
Total Protein: 6.2 g/dL — ABNORMAL LOW (ref 6.5–8.1)

## 2020-06-26 LAB — LACTATE DEHYDROGENASE: LDH: 221 U/L — ABNORMAL HIGH (ref 98–192)

## 2020-06-26 LAB — CBC WITH DIFFERENTIAL/PLATELET
Abs Immature Granulocytes: 0.02 10*3/uL (ref 0.00–0.07)
Basophils Absolute: 0.1 10*3/uL (ref 0.0–0.1)
Basophils Relative: 1 %
Eosinophils Absolute: 0.4 10*3/uL (ref 0.0–0.5)
Eosinophils Relative: 6 %
HCT: 44.6 % (ref 39.0–52.0)
Hemoglobin: 14.7 g/dL (ref 13.0–17.0)
Immature Granulocytes: 0 %
Lymphocytes Relative: 24 %
Lymphs Abs: 1.7 10*3/uL (ref 0.7–4.0)
MCH: 34 pg (ref 26.0–34.0)
MCHC: 33 g/dL (ref 30.0–36.0)
MCV: 103.2 fL — ABNORMAL HIGH (ref 80.0–100.0)
Monocytes Absolute: 1 10*3/uL (ref 0.1–1.0)
Monocytes Relative: 14 %
Neutro Abs: 3.8 10*3/uL (ref 1.7–7.7)
Neutrophils Relative %: 55 %
Platelets: 261 10*3/uL (ref 150–400)
RBC: 4.32 MIL/uL (ref 4.22–5.81)
RDW: 13.1 % (ref 11.5–15.5)
WBC: 7 10*3/uL (ref 4.0–10.5)
nRBC: 0 % (ref 0.0–0.2)

## 2020-06-26 NOTE — Progress Notes (Signed)
   Covid-19 Vaccination Clinic  Name:  [name]    MRN: [id number] DOB: 1951/07/25  06/26/2020  [name] was observed post Covid-19 immunization for 15 minutes without incident. He was provided with Vaccine Information Sheet and instruction to access the V-Safe system.   [name] was instructed to call 911 with any severe reactions post vaccine: Marland Kitchen Difficulty breathing  . Swelling of face and throat  . A fast heartbeat  . A bad rash all over body  . Dizziness and weakness   Immunizations Administered    Name Date Dose VIS Date Route   Pfizer COVID-19 Vaccine 06/26/2020 12:33 PM 0.3 mL 05/23/2020 Intramuscular   Manufacturer: [place]   Lot: [id number]   [place]: [id number]

## 2020-06-26 NOTE — Progress Notes (Signed)
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 2) Remote history of metastatic renal cell carcinoma Diagnosed with renal cell carcinoma 20 years ago and had a right nephrectomy. About 8 years after that he was noted to have abdominal recurrence in his pancreas spleen and small intestine and had a Whipple's procedure and significant abdominal surgery and notes that 10 out of 17 lymph nodes were positive. He also had his gallbladder removed. Postoperative course was complicated by an internal hemorrhage as per his report. Patient notes 2-3 years after that he had recurrence in his thyroid that led to a thyroidectomy. [June 2004] later he had partial gastrectomy for local recurrence. [February 2005] when he presented with GI bleeding. Patient notes that he has had no known evidence of recurrence over the last 8 years until his recent CT scan showed lung nodules.  Current Treatment: Sutent 37.5 mg po daily for 2 weeks on and 1 weeks off.  Previous treatment Sutent on cycle 1 - 50 mg by mouth daily for 4 weeks on and 2-weeks off. It was dose adjusted to 37.5 mg by mouth daily for 2 weeks with 1 week of to help mitigate issues with fatigue, mild hyperbilirubinemia, cytopenias. SBRT to dominant RML nodule.   INTERVAL HISTORY:  Dylan Jefferson is here for for his scheduled follow-up of his metastatic renal cell carcinoma. Pt is joined by his wife. The patient's last visit with [place] was on 04/26/2020. The pt reports that he is doing well overall.  The pt reports that he was raking leaves outside when he began to experience pain in his left  wrist. Pt then began having swelling in in his left wrist, hand, and lower arm. Pt was sent to [facility] where he was diagnosed with synovitis and given a Cortizone injection in his wrist.   He has received his annual flu vaccine and two COVID19 vaccines, but has not received his COVID19 booster yet. Pt has been seen by Dr. Alinda Money who did a scrotal US to evaluate his varicocele. Dr. Alinda Money thought that it was benign. Pt has been holding Sutent as recommended and has noticed and improvement in his leg swelling. Dr. Candiss Norse feels that it is okay to restart Sutent at this time. The pt has been monitoring his dietary sodium closely. He feels that his RA is currently well-controlled and is set to see Dr. Estanislado Pandy in February. He has minimized his topical Voltaren use.   Lab results today (06/26/20) of CBC w/diff and CMP is as follows: all values are WNL except for MCV at 103.2, Chloride at 113, Glucose at 109, BUN at 25, Creatinine at 1.85, Total Protein at 6.2, Albumin at 3.2, AST at 42, ALT at 59, ALP at 133, GFR Est at 39. 06/26/2020 LDH at 221  On review of systems, pt reports left wrist tenderness, improving leg swelling and denies abdominal pain and any other symptoms.   Past Medical History:  Diagnosis Date  . Arthritis   . Cancer [facility])    Renal cell  . DDD (degenerative disc disease), cervical 05/28/2016  . DDD (degenerative disc disease), lumbar 05/28/2016  . Depression   . Diabetes ([place])   . Hyperlipidemia  05/28/2016  . Hypertension   . Hypothyroidism   . 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 Kidney disease   . Nephrolithiasis   . Osteoarthritis of both hands 05/28/2016  . Osteoarthritis of both knees 05/28/2016  . Peripheral vascular disorder ([place]) 05/28/2016  . 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 Rheumatoid arthritis ([place])   . Thyroid cancer ([place]) 05/28/2016    . Past Surgical History:  Procedure Laterality Date  .  CATARACT EXTRACTION Bilateral 2013  . CHOLECYSTECTOMY    . GASTRECTOMY     tumor removed  . LIPOMA EXCISION Right 1999  . NEPHRECTOMY Right   . PANCREATECTOMY    . SPLENECTOMY    . THYROIDECTOMY    . URETERAL STENT PLACEMENT Left 2012  . WHIPPLE PROCEDURE      . Social History   Tobacco Use  . Smoking status: Former Smoker    Years: 1.50  . Smokeless tobacco: Never Used  Vaping Use  . Vaping Use: Never used  Substance Use Topics  . Alcohol use: Yes    Comment: occasional beer  . Drug use: No    ALLERGIES:  has No Known Allergies.  MEDICATIONS:  Current Outpatient Medications  Medication Sig Dispense Refill  . amLODipine (NORVASC) 10 MG tablet take 1 tablet by mouth once daily (Patient not taking: Reported on 03/31/2018) 90 tablet 3  . Ascorbic Acid (VITAMIN C) 1000 MG tablet Take 1,000 mg by mouth daily.    Marland Kitchen aspirin 81 MG tablet Take 81 mg by mouth daily.     Marland Kitchen atenolol (TENORMIN) 100 MG tablet Take 100 mg by mouth daily.     Marland Kitchen augmented betamethasone dipropionate (DIPROLENE-AF) 0.05 % cream Apply 1 application topically daily as needed (rash).   0  . calcium citrate-vitamin D (CITRACAL+D) 315-200 MG-UNIT per tablet Take 2 tablets by mouth 2 (two) times daily.     . Cyanocobalamin (VITAMIN B-12) 2500 MCG SUBL Take 2,500 mcg by mouth daily.     . diclofenac Sodium (VOLTAREN) 1 % GEL Apply 3 g topically 3 (three) times daily as needed (joint pain).     Marland Kitchen docusate sodium (COLACE) 100 MG capsule Take 100 mg by mouth 2 (two) times daily as needed for mild constipation.     . dorzolamide-timolol (COSOPT) 22.3-6.8 MG/ML ophthalmic solution Place 1 drop into both eyes 2 (two) times daily.   1  . famotidine (PEPCID) 20 MG tablet Take 20 mg by mouth 2 (two) times daily.    . ferrous sulfate 325 (65 FE) MG tablet Take 325 mg by mouth 2 (two) times daily with a meal.     . Glucosamine-MSM-Hyaluronic Acd (JOINT HEALTH) 750-375-30 MG TABS Take 2 tablets by mouth daily.    . insulin  lispro (HUMALOG) 100 UNIT/ML injection Inject 2-4 Units into the skin 3 (three) times daily before meals. Per sliding scale    . Iron-Vitamins (GERITOL COMPLETE) TABS Take 1 tablet by mouth daily.    . Lactobacillus (ACIDOPHILUS) CAPS capsule Take 1 capsule by mouth daily.    Marland Kitchen latanoprost (XALATAN) 0.005 % ophthalmic solution Place 1 drop into both eyes at bedtime.   1  . LEVEMIR 100 UNIT/ML injection Inject 4 Units into the skin in the morning.   1  . levothyroxine (SYNTHROID, LEVOTHROID) 300 MCG tablet Take 300 mcg by mouth daily before breakfast.     . lovastatin (MEVACOR) 40 MG tablet Take 40 mg by mouth at bedtime.     Marland Kitchen  minoxidil (LONITEN) 2.5 MG tablet Take 1 tablet (2.5 mg total) by mouth 2 (two) times daily. 60 tablet 0  . Multiple Vitamins-Minerals (PRESERVISION AREDS 2+MULTI VIT PO) Take 1 tablet by mouth in the morning and at bedtime.    . Omega-3 Fatty Acids (FISH OIL) 1200 MG CAPS Take 1 capsule by mouth daily.    . Pancrelipase, Lip-Prot-Amyl, (CREON) 6000 UNITS CPEP Take 4 capsules by mouth 3 (three) times daily.    Marland Kitchen torsemide (DEMADEX) 20 MG tablet Take 1 tablet (20 mg total) by mouth daily. 30 tablet 0   No current facility-administered medications for this visit.    REVIEW OF SYSTEMS:   A 10+ POINT REVIEW OF SYSTEMS WAS OBTAINED including neurology, dermatology, psychiatry, cardiac, respiratory, lymph, extremities, GI, GU, Musculoskeletal, constitutional, breasts, reproductive, HEENT.  All pertinent positives are noted in the HPI.  All others are negative.   PHYSICAL EXAMINATION: ECOG FS:1 - Symptomatic but completely ambulatory  Vitals:   06/26/20 1033  BP: 121/68  Pulse: (!) 45  Resp: 18  Temp: (!) 97 F (36.1 C)  SpO2: 100%   Wt Readings from Last 3 Encounters:  06/26/20 179 lb (81.2 kg)  04/26/20 183 lb 14.4 oz (83.4 kg)  03/23/20 187 lb 9.8 oz (85.1 kg)   Body mass index is 25.68 kg/m.      GENERAL:alert, in no acute distress and comfortable SKIN: no  acute rashes, no significant lesions EYES: conjunctiva are pink and non-injected, sclera anicteric OROPHARYNX: MMM, no exudates, no oropharyngeal erythema or ulceration NECK: supple, no JVD LYMPH:  no palpable lymphadenopathy in the cervical, axillary or inguinal regions LUNGS: clear to auscultation b/l with normal respiratory effort HEART: regular rate & rhythm ABDOMEN:  normoactive bowel sounds , non tender, not distended. No palpable hepatosplenomegaly.  Extremity: no pedal edema PSYCH: alert & oriented x 3 with fluent speech NEURO: no focal motor/sensory deficits  LABORATORY DATA:   I have reviewed the data as listed  . CBC Latest Ref Rng & Units 06/26/2020 04/26/2020 03/23/2020  WBC 4.0 - 10.5 K/uL 7.0 9.0 4.5  Hemoglobin 13.0 - 17.0 g/dL 14.7 12.4(L) 9.3(L)  Hematocrit 39 - 52 % 44.6 37.7(L) 27.9(L)  Platelets 150 - 400 K/uL 261 398 243  ANC 1.4k . CBC    Component Value Date/Time   WBC 7.0 06/26/2020 0951   RBC 4.32 06/26/2020 0951   HGB 14.7 06/26/2020 0951   HGB 13.3 02/28/2019 1243   HGB 14.0 06/11/2017 0808   HCT 44.6 06/26/2020 0951   HCT 40.7 06/11/2017 0808   PLT 261 06/26/2020 0951   PLT 173 02/28/2019 1243   PLT 223 06/11/2017 0808   MCV 103.2 (H) 06/26/2020 0951   MCV 112.1 (H) 06/11/2017 0808   MCH 34.0 06/26/2020 0951   MCHC 33.0 06/26/2020 0951   RDW 13.1 06/26/2020 0951   RDW 13.9 06/11/2017 0808   LYMPHSABS 1.7 06/26/2020 0951   LYMPHSABS 1.6 06/11/2017 0808   MONOABS 1.0 06/26/2020 0951   MONOABS 0.5 06/11/2017 0808   EOSABS 0.4 06/26/2020 0951   EOSABS 0.1 06/11/2017 0808   BASOSABS 0.1 06/26/2020 0951   BASOSABS 0.0 06/11/2017 0808   . CMP Latest Ref Rng & Units 06/26/2020 04/26/2020 03/23/2020  Glucose 70 - 99 mg/dL 109(H) 201(H) 202(H)  BUN 8 - 23 mg/dL 25(H) 38(H) 42(H)  Creatinine 0.61 - 1.24 mg/dL 1.85(H) 1.97(H) 3.00(H)  Sodium 135 - 145 mmol/L 144 139 140  Potassium 3.5 - 5.1 mmol/L 4.0 4.0 4.1  Chloride 98 - 111 mmol/L 113(H) 105  112(H)  CO2 22 - 32 mmol/L 25 28 19(L)  Calcium 8.9 - 10.3 mg/dL 9.0 9.1 7.7(L)  Total Protein 6.5 - 8.1 g/dL 6.2(L) 6.3(L) -  Total Bilirubin 0.3 - 1.2 mg/dL 0.6 0.6 -  Alkaline Phos 38 - 126 U/L 133(H) 115 -  AST 15 - 41 U/L 42(H) 30 -  ALT 0 - 44 U/L 59(H) 34 -   08/18/2019 CT Abdomen Pelvis Wo Contrast (Accession [id number])   08/18/2019 CT Chest Wo Contrast (Accession [id number])   RADIOGRAPHIC STUDIES: I have personally reviewed the radiological images as listed and agreed with the findings in the report. No results found.   ASSESSMENT & PLAN:   69 y.o. Caucasian male with  #1 Recurrent metastatic Renal cell carcinoma with multiple lung metastases.  Has been on stable on 1st line Sutent for >49yr. Has had SBRT to the dominant right lung nodule with partial response. No lab or clinical evidence of metastatic RCC progression at this time.  PET/CT 08/08/2016 with a couple of mildly enlarge LLL pulmonary nodules - no other overt evidence of RCC progression at this time.  PET CT scan 11/06/2016 - no overt evidence of RCC progression at this time .  PET/CT 03/13/2017 - no overt evidence of RCC progression at this time .  PET/CT 08/10/2017 - no overt evidence of RCC progression at this time .  PET/ CT done 08/10/2017, shows no evidence of  Progression at this time.  12/07/17 PET which revealed No findings suspicious for recurrent or metastatic disease.  03/22/18 PET/CT revealed One of the left lower lobe nodules has very minimally enlarged over the last 2 years, currently 0.6 by 0.4 cm and measuring 0.4 by 0.3 cm on 03/20/2016. This probably represents a previously treated metastatic nodule given that it measured 1.0 by 0.8 cm on 06/06/2015. This nodule may be subject to some slice selection phenomenon and also motion artifact given proximity to the diaphragm. Surveillance is likely warranted. 2. Chronic central lucency, marginal bony deposition, and slow healing of the right lateral  fourth rib fracture. Possibilities may include underlying radiation necrosis as a cause for fracture leading to slow healing, or a low-grade pathologic lesion. Maximum SUV at this fracture site is 2.9, previously 2.4. 3. 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 There is only low-grade activity in this vicinity characteristic of radiation fibrosis, without focal activity. 4. Other imaging findings of potential clinical significance: Aortic Atherosclerosis. Coronary atherosclerosis. Mild cardiomegaly. Right nephrectomy. 8 mm nonobstructive calculus in the left kidney lower pole.   08/24/18 CT C/A/P revealed "Stable small left lower lobe pulmonary nodules from recent prior studies. As previously noted, these have decreased in size from PET-CT 06/06/2015, likely treated metastases. No new or enlarging pulmonary nodules. 2. Postsurgical changes in the upper abdomen without evidence of local recurrence or metastatic disease. 3. Nonobstructing left renal calculus. 4. Stable radiation changes in the right upper lobe with probable chronic pathologic fracture of the right 4th rib laterally."  03/07/2019 CT C/A wo contrast revealed "1. Stable left lower lobe pulmonary nodules, likely treated Metastases. 2. No additional evidence of metastatic disease. 3. Left renal stone. 4. Post radiation scarring in the right hemithorax with a healed adjacent right rib fracture which is presumably pathologic in Etiology. 5.  Aortic atherosclerosis (ICD10-170.0). 6. 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 Confirmed by [place].  #4 mild thrombocytopenia likely related to Sutent- resolved with dose reduction. #5 Abnormal Liver function test - significant bump in transaminases. -resolved on labs today #6 grade 1 fatigue due to Sutent  -better with dose reduction and changing schedule to 2 week on and 1 week. #7 Dysguesia from Sutent - manageable per patient. He is trying to eat as well as he can and is maintaining his body weight.  #8 RBC macrocytosis due to Sutent #9 chronic kidney disease/single kidney - creatinine stable @ 1.5-1.9  Recommended to avoid NSAIDS and other nephrotoxic medications. Optimize control of DM2 #10 rheumatoid arthritis -follows with Dr. Estanislado Pandy for mx #11 DM2 -continue f/u with Dr Philip Aspen for continue mx, blood sugar at home per patient.    PLAN: -Discussed pt labwork today, 06/26/20; blood counts look good, liver enzymes are borderline elevated, LDH is elevated but stable.  -No lab or clinical evidence of renal cell carcinoma recurrence at this time. Will continue watchful observation. -Advised pt to restart Sutent as prescribed 37.5mg  2 weeks on 1 week off. No prohibitive toxicities at this time.  -Discussed CDC guidelines regarding the COVID19 booster. Will give in clinic today.  -Recommend pt wear compression socks and elevate legs to improve leg swelling.-- given prescription for these. -Recommend pt track his weight at home to monitor fluid status.  -Recommend pt continue to use Voltaren gel only as needed. -Recommend pt f/u with Dr. Estanislado Pandy as scheduled. -Will see back in 3-4 weeks with labs    FOLLOW UP: Covid booster vaccine for patient and his wife today RTC with Dr Irene Limbo with labs in about 3-4 weeks   The total time spent in the appt was 30 minutes and more than 50% was on counseling and direct patient cares.  All of the patient's questions were answered with apparent satisfaction. The patient knows to call the clinic with any problems, questions or concerns.   Sullivan Lone MD [place] AAHIVMS [facility] Hematology/Oncology Physician [facility]  (Office):       [phone] (Work cell):  [phone] (Fax):           [phone]  I, Yevette Edwards, am acting as  a scribe for Dr. Sullivan Lone.   .I have reviewed the above documentation for accuracy and completeness, and I agree with the above. Brunetta Genera MD

## 2020-07-03 ENCOUNTER — Other Ambulatory Visit: Payer: Self-pay | Admitting: Hematology

## 2020-07-03 ENCOUNTER — Telehealth: Payer: Self-pay

## 2020-07-03 DIAGNOSIS — C649 Malignant neoplasm of unspecified kidney, except renal pelvis: Secondary | ICD-10-CM

## 2020-07-03 NOTE — Telephone Encounter (Signed)
Oral Oncology Patient Advocate Encounter  Was successful in securing patient a $10000 grant from [facility] to provide copayment coverage for Sutent.  This will keep the out of pocket expense at $0.     [employer] ID: [id number]   I have spoken with the patient.   The billing information is as follows and has been shared with [place]: [id number] PCN: [id number] Member ID: [id number] Group ID: [id number] Dates of Eligibility: 06/03/20 through 06/02/21  Fund:  Renal cell  [place] [facility] [place] Phone [phone] Fax [phone] 07/03/2020 11:16 AM

## 2020-07-04 ENCOUNTER — Other Ambulatory Visit: Payer: Self-pay | Admitting: Hematology

## 2020-07-16 MED FILL — SUTENT 37.5 MG CAPSULE: 37.5 | 21 days supply | Qty: 14 | Fill #0

## 2020-07-24 ENCOUNTER — Inpatient Hospital Stay: Payer: Medicare Other | Admitting: Hematology

## 2020-07-24 ENCOUNTER — Other Ambulatory Visit: Payer: Self-pay

## 2020-07-24 ENCOUNTER — Inpatient Hospital Stay: Payer: Medicare Other | Attending: Hematology

## 2020-07-24 VITALS — BP 137/78 | HR 41 | Temp 96.9°F | Resp 18 | Ht 70.0 in | Wt 176.7 lb

## 2020-07-24 DIAGNOSIS — C649 Malignant neoplasm of unspecified kidney, except renal pelvis: Secondary | ICD-10-CM

## 2020-07-24 DIAGNOSIS — D696 Thrombocytopenia, unspecified: Secondary | ICD-10-CM | POA: Insufficient documentation

## 2020-07-24 DIAGNOSIS — M19042 Primary osteoarthritis, left hand: Secondary | ICD-10-CM | POA: Diagnosis not present

## 2020-07-24 DIAGNOSIS — Z923 Personal history of irradiation: Secondary | ICD-10-CM | POA: Insufficient documentation

## 2020-07-24 DIAGNOSIS — Z7982 Long term (current) use of aspirin: Secondary | ICD-10-CM | POA: Diagnosis not present

## 2020-07-24 DIAGNOSIS — C7900 Secondary malignant neoplasm of unspecified kidney and renal pelvis: Secondary | ICD-10-CM

## 2020-07-24 DIAGNOSIS — C641 Malignant neoplasm of right kidney, except renal pelvis: Secondary | ICD-10-CM | POA: Diagnosis not present

## 2020-07-24 DIAGNOSIS — Z79899 Other long term (current) drug therapy: Secondary | ICD-10-CM | POA: Insufficient documentation

## 2020-07-24 DIAGNOSIS — M069 Rheumatoid arthritis, unspecified: Secondary | ICD-10-CM | POA: Diagnosis not present

## 2020-07-24 DIAGNOSIS — E039 Hypothyroidism, unspecified: Secondary | ICD-10-CM | POA: Diagnosis not present

## 2020-07-24 DIAGNOSIS — E119 Type 2 diabetes mellitus without complications: Secondary | ICD-10-CM | POA: Diagnosis not present

## 2020-07-24 DIAGNOSIS — I10 Essential (primary) hypertension: Secondary | ICD-10-CM | POA: Diagnosis not present

## 2020-07-24 DIAGNOSIS — C7800 Secondary malignant neoplasm of unspecified lung: Secondary | ICD-10-CM | POA: Insufficient documentation

## 2020-07-24 DIAGNOSIS — M19041 Primary osteoarthritis, right hand: Secondary | ICD-10-CM | POA: Insufficient documentation

## 2020-07-24 DIAGNOSIS — Z905 Acquired absence of kidney: Secondary | ICD-10-CM | POA: Insufficient documentation

## 2020-07-24 DIAGNOSIS — M170 Bilateral primary osteoarthritis of knee: Secondary | ICD-10-CM | POA: Diagnosis present

## 2020-07-24 DIAGNOSIS — R7989 Other specified abnormal findings of blood chemistry: Secondary | ICD-10-CM | POA: Insufficient documentation

## 2020-07-24 DIAGNOSIS — Z87891 Personal history of nicotine dependence: Secondary | ICD-10-CM | POA: Insufficient documentation

## 2020-07-24 DIAGNOSIS — Z85850 Personal history of malignant neoplasm of thyroid: Secondary | ICD-10-CM | POA: Insufficient documentation

## 2020-07-24 DIAGNOSIS — Z794 Long term (current) use of insulin: Secondary | ICD-10-CM | POA: Diagnosis not present

## 2020-07-24 LAB — CBC WITH DIFFERENTIAL/PLATELET
Abs Immature Granulocytes: 0.01 10*3/uL (ref 0.00–0.07)
Basophils Absolute: 0 10*3/uL (ref 0.0–0.1)
Basophils Relative: 1 %
Eosinophils Absolute: 0.4 10*3/uL (ref 0.0–0.5)
Eosinophils Relative: 6 %
HCT: 43.7 % (ref 39.0–52.0)
Hemoglobin: 14.8 g/dL (ref 13.0–17.0)
Immature Granulocytes: 0 %
Lymphocytes Relative: 30 %
Lymphs Abs: 1.7 10*3/uL (ref 0.7–4.0)
MCH: 33.3 pg (ref 26.0–34.0)
MCHC: 33.9 g/dL (ref 30.0–36.0)
MCV: 98.2 fL (ref 80.0–100.0)
Monocytes Absolute: 0.8 10*3/uL (ref 0.1–1.0)
Monocytes Relative: 13 %
Neutro Abs: 2.8 10*3/uL (ref 1.7–7.7)
Neutrophils Relative %: 50 %
Platelets: 161 10*3/uL (ref 150–400)
RBC: 4.45 MIL/uL (ref 4.22–5.81)
RDW: 14.1 % (ref 11.5–15.5)
WBC: 5.7 10*3/uL (ref 4.0–10.5)
nRBC: 0 % (ref 0.0–0.2)

## 2020-07-24 LAB — CMP (CANCER CENTER ONLY)
ALT: 43 U/L (ref 0–44)
AST: 39 U/L (ref 15–41)
Albumin: 3 g/dL — ABNORMAL LOW (ref 3.5–5.0)
Alkaline Phosphatase: 162 U/L — ABNORMAL HIGH (ref 38–126)
Anion gap: 7 (ref 5–15)
BUN: 19 mg/dL (ref 8–23)
CO2: 25 mmol/L (ref 22–32)
Calcium: 8.6 mg/dL — ABNORMAL LOW (ref 8.9–10.3)
Chloride: 113 mmol/L — ABNORMAL HIGH (ref 98–111)
Creatinine: 1.95 mg/dL — ABNORMAL HIGH (ref 0.61–1.24)
GFR, Estimated: 36 mL/min — ABNORMAL LOW (ref >60)
Glucose, Bld: 85 mg/dL (ref 70–99)
Potassium: 3.6 mmol/L (ref 3.5–5.1)
Sodium: 145 mmol/L (ref 135–145)
Total Bilirubin: 0.7 mg/dL (ref 0.3–1.2)
Total Protein: 6 g/dL — ABNORMAL LOW (ref 6.5–8.1)

## 2020-07-24 LAB — LACTATE DEHYDROGENASE: LDH: 207 U/L — ABNORMAL HIGH (ref 98–192)

## 2020-07-24 NOTE — Progress Notes (Signed)
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 2) Remote history of metastatic renal cell carcinoma Diagnosed with renal cell carcinoma 20 years ago and had a right nephrectomy. About 8 years after that he was noted to have abdominal recurrence in his pancreas spleen and small intestine and had a Whipple's procedure and significant abdominal surgery and notes that 10 out of 17 lymph nodes were positive. He also had his gallbladder removed. Postoperative course was complicated by an internal hemorrhage as per his report. Patient notes 2-3 years after that he had recurrence in his thyroid that led to a thyroidectomy. [June 2004] later he had partial gastrectomy for local recurrence. [February 2005] when he presented with GI bleeding. Patient notes that he has had no known evidence of recurrence over the last 8 years until his recent CT scan showed lung nodules.  Current Treatment: Sutent 37.5 mg po daily for 2 weeks on and 1 weeks off.  Previous treatment Sutent on cycle 1 - 50 mg by mouth daily for 4 weeks on and 2-weeks off. It was dose adjusted to 37.5 mg by mouth daily for 2 weeks with 1 week of to help mitigate issues with fatigue, mild hyperbilirubinemia, cytopenias. SBRT to dominant RML nodule.   INTERVAL HISTORY:  Dylan Jefferson is here for for his scheduled follow-up of his metastatic renal cell carcinoma. Pt is joined by his wife. The patient's last visit with [place] was on 06/26/2020. The pt reports that he is doing well overall.  The pt reports that he is not currently experiencing any leg swelling. He has noticed an  increase in blood pressure since starting Sutent. Pt had some dizziness when restarting Sutent but this has resolved. He is still using Voltaren topically for joint pain.   Lab results today (07/24/20) of CBC w/diff and CMP is as follows: all values are WNL except for Glucose at 113, Creatinine at 1.95, Calcium at 8.6, Total Protein at 6.0, Albumin at 3.0, ALP at 162, GFR Est at 36. 07/24/2020 LDH at 207  On review of systems, pt reports joint pain and denies dizziness, leg swelling, low appetite and any other symptoms.   Past Medical History:  Diagnosis Date  . Arthritis   . Cancer [facility])    Renal cell  . DDD (degenerative disc disease), cervical 05/28/2016  . DDD (degenerative disc disease), lumbar 05/28/2016  . Depression   . Diabetes ([place])   . Hyperlipidemia 05/28/2016  . Hypertension   . Hypothyroidism   . 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 Kidney disease   . Nephrolithiasis   . Osteoarthritis of both hands 05/28/2016  . Osteoarthritis of both knees 05/28/2016  . Peripheral vascular disorder ([place]) 05/28/2016  . 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 Rheumatoid arthritis ([place])   . Thyroid cancer ([place]) 05/28/2016    . Past Surgical History:  Procedure Laterality Date  . CATARACT EXTRACTION Bilateral 2013  . CHOLECYSTECTOMY    . GASTRECTOMY     tumor removed  . LIPOMA EXCISION Right 1999  . NEPHRECTOMY Right   . PANCREATECTOMY    . SPLENECTOMY    . THYROIDECTOMY    .  URETERAL STENT PLACEMENT Left 2012  . WHIPPLE PROCEDURE      . Social History   Tobacco Use  . Smoking status: Former Smoker    Years: 1.50  . Smokeless tobacco: Never Used  Vaping Use  . Vaping Use: Never used  Substance Use Topics  . Alcohol use: Yes    Comment: occasional beer  . Drug use: No    ALLERGIES:  is allergic to other.  MEDICATIONS:  Current Outpatient Medications  Medication Sig Dispense Refill  . amLODipine (NORVASC) 10  MG tablet take 1 tablet by mouth once daily 90 tablet 3  . aspirin 81 MG tablet Take 81 mg by mouth daily.     Marland Kitchen atenolol (TENORMIN) 100 MG tablet Take 100 mg by mouth daily.     Marland Kitchen augmented betamethasone dipropionate (DIPROLENE-AF) 0.05 % cream Apply 1 application topically daily as needed (rash).   0  . betamethasone dipropionate 0.05 % cream Apply topically 2 (two) times daily.    . calcium citrate-vitamin D (CITRACAL+D) 315-200 MG-UNIT per tablet Take 2 tablets by mouth 2 (two) times daily.     . Cholecalciferol (VITAMIN D-3 PO) Take 1,000 Units by mouth daily.    . Cyanocobalamin (VITAMIN B-12) 2500 MCG SUBL Take 2,500 mcg by mouth daily.     . diclofenac Sodium (VOLTAREN) 1 % GEL Apply 3 g topically 3 (three) times daily as needed (joint pain).     Marland Kitchen docusate sodium (COLACE) 100 MG capsule Take 100 mg by mouth 2 (two) times daily as needed for mild constipation.     . dorzolamide-timolol (COSOPT) 22.3-6.8 MG/ML ophthalmic solution Place 1 drop into both eyes 2 (two) times daily.   1  . doxazosin (CARDURA) 2 MG tablet Take 2 mg by mouth daily.    . famotidine (PEPCID) 20 MG tablet Take 20 mg by mouth 2 (two) times daily.    . ferrous sulfate 325 (65 FE) MG tablet Take 325 mg by mouth 2 (two) times daily with a meal.     . Glucosamine-MSM-Hyaluronic Acd 750-375-30 MG TABS Take 2 tablets by mouth daily.    . insulin lispro (HUMALOG) 100 UNIT/ML injection Inject 2-4 Units into the skin 3 (three) times daily before meals. Per sliding scale    . Iron-Vitamins (GERITOL COMPLETE) TABS Take 1 tablet by mouth daily.    . Lactobacillus (ACIDOPHILUS) CAPS capsule Take 1 capsule by mouth daily.    Marland Kitchen latanoprost (XALATAN) 0.005 % ophthalmic solution Place 1 drop into both eyes at bedtime.   1  . LEVEMIR 100 UNIT/ML injection Inject 4 Units into the skin 2 (two) times daily.   1  . levothyroxine (SYNTHROID, LEVOTHROID) 300 MCG tablet Take 300 mcg by mouth daily before breakfast.     . lovastatin  (MEVACOR) 40 MG tablet Take 40 mg by mouth at bedtime.     . Multiple Vitamins-Minerals (PRESERVISION AREDS 2+MULTI VIT PO) Take 1 tablet by mouth in the morning and at bedtime.    . Omega-3 Fatty Acids (FISH OIL) 1200 MG CAPS Take 1 capsule by mouth daily.    . Pancrelipase, Lip-Prot-Amyl, 6000-19000 units CPEP Take 4 capsules by mouth 3 (three) times daily.    . potassium citrate (UROCIT-K) 10 MEQ (1080 MG) SR tablet Take 10 mEq by mouth 3 (three) times daily.    . sulindac (CLINORIL) 150 MG tablet Take 150 mg by mouth as needed.    . SUTENT 37.5 MG capsule TAKE 1 CAPSULE (37.5 MG)  BY MOUTH ONCE DAILY FOR 2 WEEKS ON, 1 WEEK OFF 14 capsule 0  . traMADol (ULTRAM) 50 MG tablet Take 50 mg by mouth 2 (two) times daily as needed.    . gabapentin (NEURONTIN) 300 MG capsule Take 300 mg by mouth at bedtime.     No current facility-administered medications for this visit.    REVIEW OF SYSTEMS:   A 10+ POINT REVIEW OF SYSTEMS WAS OBTAINED including neurology, dermatology, psychiatry, cardiac, respiratory, lymph, extremities, GI, GU, Musculoskeletal, constitutional, breasts, reproductive, HEENT.  All pertinent positives are noted in the HPI.  All others are negative.   PHYSICAL EXAMINATION: ECOG FS:1 - Symptomatic but completely ambulatory  Vitals:   07/24/20 1033  BP: 137/78  Pulse: (!) 41  Resp: 18  Temp: (!) 96.9 F (36.1 C)  SpO2: 100%   Wt Readings from Last 3 Encounters:  07/24/20 176 lb 11.2 oz (80.2 kg)  06/26/20 179 lb (81.2 kg)  04/26/20 183 lb 14.4 oz (83.4 kg)   Body mass index is 25.35 kg/m.      GENERAL:alert, in no acute distress and comfortable SKIN: no acute rashes, no significant lesions EYES: conjunctiva are pink and non-injected, sclera anicteric OROPHARYNX: MMM, no exudates, no oropharyngeal erythema or ulceration NECK: supple, no JVD LYMPH:  no palpable lymphadenopathy in the cervical, axillary or inguinal regions LUNGS: clear to auscultation b/l with normal  respiratory effort HEART: regular rate & rhythm ABDOMEN:  normoactive bowel sounds , non tender, not distended. No palpable hepatosplenomegaly.  Extremity: no pedal edema PSYCH: alert & oriented x 3 with fluent speech NEURO: no focal motor/sensory deficits  LABORATORY DATA:   I have reviewed the data as listed  . CBC Latest Ref Rng & Units 07/24/2020 06/26/2020 04/26/2020  WBC 4.0 - 10.5 K/uL 5.7 7.0 9.0  Hemoglobin 13.0 - 17.0 g/dL 14.8 14.7 12.4(L)  Hematocrit 39.0 - 52.0 % 43.7 44.6 37.7(L)  Platelets 150 - 400 K/uL 161 261 398  ANC 1.4k . CBC    Component Value Date/Time   WBC 5.7 07/24/2020 0916   RBC 4.45 07/24/2020 0916   HGB 14.8 07/24/2020 0916   HGB 13.3 02/28/2019 1243   HGB 14.0 06/11/2017 0808   HCT 43.7 07/24/2020 0916   HCT 40.7 06/11/2017 0808   PLT 161 07/24/2020 0916   PLT 173 02/28/2019 1243   PLT 223 06/11/2017 0808   MCV 98.2 07/24/2020 0916   MCV 112.1 (H) 06/11/2017 0808   MCH 33.3 07/24/2020 0916   MCHC 33.9 07/24/2020 0916   RDW 14.1 07/24/2020 0916   RDW 13.9 06/11/2017 0808   LYMPHSABS 1.7 07/24/2020 0916   LYMPHSABS 1.6 06/11/2017 0808   MONOABS 0.8 07/24/2020 0916   MONOABS 0.5 06/11/2017 0808   EOSABS 0.4 07/24/2020 0916   EOSABS 0.1 06/11/2017 0808   BASOSABS 0.0 07/24/2020 0916   BASOSABS 0.0 06/11/2017 0808   . CMP Latest Ref Rng & Units 07/24/2020 06/26/2020 04/26/2020  Glucose 70 - 99 mg/dL 85 109(H) 201(H)  BUN 8 - 23 mg/dL 19 25(H) 38(H)  Creatinine 0.61 - 1.24 mg/dL 1.95(H) 1.85(H) 1.97(H)  Sodium 135 - 145 mmol/L 145 144 139  Potassium 3.5 - 5.1 mmol/L 3.6 4.0 4.0  Chloride 98 - 111 mmol/L 113(H) 113(H) 105  CO2 22 - 32 mmol/L 25 25 28   Calcium 8.9 - 10.3 mg/dL 8.6(L) 9.0 9.1  Total Protein 6.5 - 8.1 g/dL 6.0(L) 6.2(L) 6.3(L)  Total Bilirubin 0.3 - 1.2 mg/dL 0.7 0.6 0.6  Alkaline Phos  38 - 126 U/L 162(H) 133(H) 115  AST 15 - 41 U/L 39 42(H) 30  ALT 0 - 44 U/L 43 59(H) 34   08/18/2019 CT Abdomen Pelvis Wo Contrast  (Accession [id number])   08/18/2019 CT Chest Wo Contrast (Accession [id number])   RADIOGRAPHIC STUDIES: I have personally reviewed the radiological images as listed and agreed with the findings in the report. No results found.   ASSESSMENT & PLAN:   69 y.o. Caucasian male with  #1 Recurrent metastatic Renal cell carcinoma with multiple lung metastases.  Has been on stable on 1st line Sutent for >30yr. Has had SBRT to the dominant right lung nodule with partial response. No lab or clinical evidence of metastatic RCC progression at this time.  PET/CT 08/08/2016 with a couple of mildly enlarge LLL pulmonary nodules - no other overt evidence of RCC progression at this time.  PET CT scan 11/06/2016 - no overt evidence of RCC progression at this time .  PET/CT 03/13/2017 - no overt evidence of RCC progression at this time .  PET/CT 08/10/2017 - no overt evidence of RCC progression at this time .  PET/ CT done 08/10/2017, shows no evidence of  Progression at this time.  12/07/17 PET which revealed No findings suspicious for recurrent or metastatic disease.  03/22/18 PET/CT revealed One of the left lower lobe nodules has very minimally enlarged over the last 2 years, currently 0.6 by 0.4 cm and measuring 0.4 by 0.3 cm on 03/20/2016. This probably represents a previously treated metastatic nodule given that it measured 1.0 by 0.8 cm on 06/06/2015. This nodule may be subject to some slice selection phenomenon and also motion artifact given proximity to the diaphragm. Surveillance is likely warranted. 2. Chronic central lucency, marginal bony deposition, and slow healing of the right lateral fourth rib fracture. Possibilities may include underlying radiation necrosis as a cause for fracture leading to slow healing, or a low-grade pathologic lesion. Maximum SUV at this fracture site is 2.9, previously 2.4. 3. 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 There is only low-grade activity in this vicinity characteristic of radiation fibrosis, without focal activity. 4. Other imaging findings of potential clinical significance: Aortic Atherosclerosis. Coronary atherosclerosis. Mild cardiomegaly. Right nephrectomy. 8 mm nonobstructive calculus in the left kidney lower pole.   08/24/18 CT C/A/P revealed "Stable small left lower lobe pulmonary nodules from recent prior studies. As previously noted, these have decreased in size from PET-CT 06/06/2015, likely treated metastases. No new or enlarging pulmonary nodules. 2. Postsurgical changes in the upper abdomen without evidence of local recurrence or metastatic disease. 3. Nonobstructing left renal calculus. 4. Stable radiation changes in the right upper lobe with probable chronic pathologic fracture of the right 4th rib laterally."  03/07/2019 CT C/A wo contrast revealed "1. Stable left lower lobe pulmonary nodules, likely treated Metastases. 2. No additional evidence of metastatic disease. 3. Left renal stone. 4. Post radiation scarring in the right hemithorax with a healed adjacent right rib fracture which is presumably pathologic in Etiology. 5.  Aortic atherosclerosis (ICD10-170.0). 6. 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 Confirmed by [place].  #4 mild thrombocytopenia likely related to Sutent- resolved with dose reduction. #5 Abnormal Liver function test - significant bump in transaminases. -resolved on labs today #6 grade 1 fatigue due to Sutent -better with dose  reduction and changing schedule to 2 week on and 1 week. #7 Dysguesia from Sutent - manageable per patient. He is trying to eat as well as he can and is maintaining his body weight.  #8 RBC macrocytosis due to Sutent #9 chronic kidney disease/single kidney - creatinine stable @ 1.5-1.9  Recommended to  avoid NSAIDS and other nephrotoxic medications. Optimize control of DM2 #10 rheumatoid arthritis -follows with Dr. Estanislado Pandy for mx #11 DM2 -continue f/u with Dr Philip Aspen for continue mx, blood sugar at home per patient.    PLAN: -Discussed pt labwork today, 07/24/20; blood counts are nml, renal function is stable, LDH is elevated but trending down. -No lab or clinical evidence of renal cell carcinoma recurrence at this time. Will continue watchful observation. -Advised pt to continue 37.5 mg Sutent 2 weeks on 1 week off. No prohibitive toxicities at this time.  -Recommend pt continue to use Voltaren gel only as needed. -Recommend pt continue f/u with Dr. Candiss Norse for mx of fluid overload and renal dysfunction -Will see back in 2 months with labs   FOLLOW UP: RTC with Dr Irene Limbo with labs in about 8 weeks   The total time spent in the appt was 20 minutes and more than 50% was on counseling and direct patient cares.  All of the patient's questions were answered with apparent satisfaction. The patient knows to call the clinic with any problems, questions or concerns.   Sullivan Lone MD [place] AAHIVMS [facility] Hematology/Oncology Physician [facility]  (Office):       [phone] (Work cell):  [phone] (Fax):           [phone]  I, Yevette Edwards, am acting as a scribe for Dr. Sullivan Lone.   .I have reviewed the above documentation for accuracy and completeness, and I agree with the above. Brunetta Genera MD

## 2020-08-01 ENCOUNTER — Other Ambulatory Visit: Payer: Self-pay | Admitting: Hematology

## 2020-08-01 DIAGNOSIS — C649 Malignant neoplasm of unspecified kidney, except renal pelvis: Secondary | ICD-10-CM

## 2020-08-07 MED FILL — SUTENT 37.5 MG CAPSULE: 37.5 | 21 days supply | Qty: 14 | Fill #0

## 2020-08-08 ENCOUNTER — Telehealth: Payer: Self-pay

## 2020-08-08 NOTE — Telephone Encounter (Signed)
Oral Oncology Patient Advocate Encounter  Patient has been approved for copay assistance with [employer] (TAF).  [employer] [place] will cover all copayment expenses for Sutent for the remainder of the calendar year.    The billing information is as follows and has been shared with [place].   Member ID: [id number] Group ID: [id number] PCN: [id number] BIN: [id number] Eligibility Dates: 08/04/20 to 08/03/21  Fund: Renal cell   [place] Patient [place] Phone [phone] Fax [phone] 08/08/2020 10:54 AM

## 2020-08-22 NOTE — Progress Notes (Signed)
Office Visit Note  Patient: Dylan Jefferson             Date of Birth: Dec 11, 1950           MRN: [id number]             PCP: [name], MD Referring: [name], MD Visit Date: 09/05/2020 Occupation: @GUAROCC @  Subjective:  Pain in multiple joints.   History of Present Illness: Dylan Jefferson is a 70 y.o. male patient returns today after his last visit in December 2018.  At that time he was taking Plaquenil for inflammatory arthritis.  He has been off Plaquenil for many years now.  He states he has been getting treatment for metastatic renal cell cancer.  He has pain and discomfort in his neck, lower back, shoulders, hands and his feet.  He states all of his joints pop.  One time he fell he was gardening he developed some swelling in his left wrist joint which responded to cortisone injection.  None of the joints are swollen.  He states he has been seeing Dr. Candiss Norse for proteinuria.  His GFR is low.  He does not want to take any medications which can lower his GFR further.  He states he has been told by his oncologist, PCP and nephrologist to schedule an appointment with me for the treatment of arthritis.  He has not seen any swelling and does not see the need for taking any medications.  Activities of Daily Living:  Patient reports morning stiffness for 24 hours.   Patient Reports nocturnal pain.  Difficulty dressing/grooming: Denies Difficulty climbing stairs: Denies Difficulty getting out of chair: Denies Difficulty using hands for taps, buttons, cutlery, and/or writing: Denies  Review of Systems  Constitutional: Negative for fatigue.  HENT: Negative for mouth sores, mouth dryness and nose dryness.   Eyes: Negative for pain, itching, visual disturbance and dryness.  Respiratory: Negative for cough, hemoptysis, shortness of breath and difficulty breathing.   Cardiovascular: Negative for chest pain, palpitations and swelling in legs/feet.  Gastrointestinal: Negative  for abdominal pain, blood in stool, constipation and diarrhea.  Endocrine: Negative for increased urination.  Genitourinary: Negative for painful urination.  Musculoskeletal: Positive for arthralgias, joint pain, myalgias, muscle weakness, morning stiffness, muscle tenderness and myalgias. Negative for joint swelling.  Skin: Negative for color change, rash and redness.  Allergic/Immunologic: Negative for susceptible to infections.  Neurological: Negative for dizziness, numbness, headaches, memory loss and weakness.  Hematological: Negative for swollen glands.  Psychiatric/Behavioral: Positive for sleep disturbance. Negative for confusion.    PMFS History:  Patient Active Problem List   Diagnosis Date Noted  . AKI (acute kidney injury) ([place]) 03/21/2020  . Congestive heart failure (CHF) ([place]) 03/19/2020  . Counseling regarding advanced care planning and goals of care 06/11/2017  . Contracture, right elbow 12/12/2016  . 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 History of diabetes mellitus 07/24/2016  . History of esophagitis 07/24/2016  . Thyroid cancer ([place]) 05/28/2016  . Hyperlipidemia 05/28/2016  . Inflammatory polyarthritis ([place]) 05/28/2016  . DDD (degenerative disc disease), cervical 05/28/2016  . DDD (degenerative disc disease), lumbar 05/28/2016  . Osteoarthritis of both knees 05/28/2016  . Osteoarthritis of both hands 05/28/2016  . Peripheral vascular disorder ([place]) 05/28/2016  . 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 Nodule of finger of left hand 10/11/2015  . Lung metastases ([place]) 09/06/2015  . Fatigue 08/16/2015  . Hyperbilirubinemia 08/16/2015  . Renal insufficiency 08/09/2015  . Encounter for chemotherapy management 07/16/2015  .  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 ([place]) 07/12/2015  . Personal history of renal cell cancer 03/29/2014  . Special screening for malignant neoplasms, colon 03/29/2014  . Esophageal varices with bleeding ([place]) 03/29/2014  . RENAL CELL  CANCER, METASTATIC 12/04/2007  . Hypothyroidism 12/04/2007  . Diabetes mellitus ([place]) 12/04/2007  . HTN (hypertension) 12/04/2007  . ESOPHAGITIS 12/04/2007  . HEMOCCULT POSITIVE STOOL 12/04/2007  . HEADACHE, CHRONIC 12/04/2007    Past Medical History:  Diagnosis Date  . Arthritis   . Cancer [facility])    Renal cell  . DDD (degenerative disc disease), cervical 05/28/2016  . DDD (degenerative disc disease), lumbar 05/28/2016  . Depression   . Diabetes ([place])   . Hyperlipidemia 05/28/2016  . Hypertension   . Hypothyroidism   . Inflammatory polyarthritis ([place]) 05/28/2016   Sero Negative, Ultrasound positive synovitis   . Kidney disease   . Macular degeneration    Bilateral eyes  . Nephrolithiasis   . Osteoarthritis of both hands 05/28/2016  . Osteoarthritis of both knees 05/28/2016  . Peripheral vascular disorder ([place]) 05/28/2016  . 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 Rheumatoid arthritis ([place])   . Thyroid cancer ([place]) 05/28/2016    Family History  Problem Relation Age of Onset  . Bleeding Disorder Mother        Unknown what type  . Osteoporosis Mother   . Lymphoma Father        Hodgkin's lymphoma  . Colon cancer Neg Hx    Past Surgical History:  Procedure Laterality Date  . CATARACT EXTRACTION Bilateral 2013  . CHOLECYSTECTOMY    . GASTRECTOMY     tumor removed  . LIPOMA EXCISION Right 1999  . NEPHRECTOMY Right   . PANCREATECTOMY    . SPLENECTOMY    . THYROIDECTOMY    . URETERAL STENT PLACEMENT Left 2012  . WHIPPLE PROCEDURE     Social History   Social History Narrative  . Not on file   Immunization History  Administered Date(s) Administered  . PFIZER(Purple Top)SARS-COV-2 Vaccination 09/18/2019, 10/11/2019, 06/26/2020     Objective: Vital Signs: BP (!) 159/99 (BP Location: Right Arm, Patient Position: Sitting, Cuff Size: Normal)   Pulse (!) 46   Ht 5' 9.75" (1.772 m)   Wt 181 lb (82.1 kg)   BMI 26.16 kg/m    Physical Exam Vitals and nursing note reviewed.   Constitutional:      Appearance: He is well-developed and well-nourished.  HENT:     Head: Normocephalic and atraumatic.  Eyes:     Extraocular Movements: EOM normal.     Conjunctiva/sclera: Conjunctivae normal.     Pupils: Pupils are equal, round, and reactive to light.  Cardiovascular:     Rate and Rhythm: Normal rate and regular rhythm.     Heart sounds: Normal heart sounds.  Pulmonary:     Effort: Pulmonary effort is normal.     Breath sounds: Normal breath sounds.  Abdominal:     General: Bowel sounds are normal.     Palpations: Abdomen is soft.  Musculoskeletal:     Cervical back: Normal range of motion and neck supple.  Skin:    General: Skin is warm and dry.     Capillary Refill: Capillary refill takes less than 2 seconds.  Neurological:     Mental Status: He is alert and oriented to person, place, and time.  Psychiatric:        Mood and Affect: Mood and affect normal.        Behavior:  Behavior normal.      Musculoskeletal Exam: C-spine thoracic and lumbar spine were in good range of motion with discomfort.  Shoulder joints were in good range of motion with no discomfort.  He had contracture in his right elbow joint which is old.  He had no synovitis over MCPs PIPs or DIPs.  He had tenderness over some of his PIPs and DIPs with no synovitis.  Hip joints and knee joints with good range of motion.  No warmth swelling effusion was noted.  He had no tenderness or swelling over ankle joints or MTPs.  CDAI Exam: CDAI Score: -- Patient Global: --; Provider Global: -- Swollen: --; Tender: -- Joint Exam 09/05/2020   No joint exam has been documented for this visit   There is currently no information documented on the homunculus. Go to the Rheumatology activity and complete the homunculus joint exam.  Investigation: No additional findings.  Imaging: No results found.  Recent Labs: Lab Results  Component Value Date   WBC 5.7 07/24/2020   HGB 14.8 07/24/2020   PLT  161 07/24/2020   NA 145 07/24/2020   K 3.6 07/24/2020   CL 113 (H) 07/24/2020   CO2 25 07/24/2020   GLUCOSE 85 07/24/2020   BUN 19 07/24/2020   CREATININE 1.95 (H) 07/24/2020   BILITOT 0.7 07/24/2020   ALKPHOS 162 (H) 07/24/2020   AST 39 07/24/2020   ALT 43 07/24/2020   PROT 6.0 (L) 07/24/2020   ALBUMIN 3.0 (L) 07/24/2020   CALCIUM 8.6 (L) 07/24/2020   GFRAA 39 (L) 04/26/2020    Speciality Comments: No specialty comments available.  Procedures:  No procedures performed Allergies: Iodine and Other   Assessment / Plan:     Visit Diagnoses: Inflammatory arthritis -patient is seronegative chronic inflammatory arthritis in the past which was treated with Plaquenil.  It was discontinued after the diagnosis of renal cell cancer.  He has been off medication for several years.  He denies any joint swelling.  He had only one episode of left wrist swelling which responded to cortisone injection.  He had no synovitis on my examination today.  He complains of discomfort in his cervical spine, lumbar spine, hands and his feet.  I offered x-rays which he declined.  He stated that he is not interested in taking the medications which can affect his kidney function.  I offered obtaining labs which he declined.  I advised him to contact me in case he develops any increased swelling.  High risk medication use - Previously on PLQ  Primary osteoarthritis of both knees-he denies any discomfort on examination today.  He had no warmth swelling or effusion.  Primary osteoarthritis of both hands-he has DIP and PIP thickening consistent with osteoarthritis.  No synovitis was noted.  Joint protection was discussed.  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  DDD (degenerative disc disease), lumbar-he complains of chronic discomfort in his lower back.  Chronic pain-he will benefit from referral to pain management  where he can get medications to manage chronic discomfort.  I explained to him that the medications do not necessarily have to be narcotics.  Renal cell carcinoma, unspecified laterality (HCC)-followed by oncology.  Primary hypertension-his blood pressure was elevated today but he was upset about today's visit as well.  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  No orders of the defined types were placed in this encounter.    Follow-Up Instructions: Return if symptoms worsen or fail to improve, for Osteoarthritis.   Bo Merino, MD  Note - This record has been created using Editor, commissioning.  Chart creation errors have been sought, but may not always  have been located. Such creation errors do not reflect on  the standard of medical care.

## 2020-08-23 ENCOUNTER — Other Ambulatory Visit: Payer: Self-pay | Admitting: Hematology

## 2020-08-23 DIAGNOSIS — C649 Malignant neoplasm of unspecified kidney, except renal pelvis: Secondary | ICD-10-CM

## 2020-08-24 NOTE — Telephone Encounter (Signed)
Patient request refill

## 2020-08-29 ENCOUNTER — Other Ambulatory Visit: Payer: Self-pay | Admitting: Hematology

## 2020-08-29 MED ORDER — SUNITINIB MALATE 37.5 MG PO CAPS
ORAL_CAPSULE | ORAL | 11 refills | Status: DC
Start: 2020-08-29 — End: 2020-08-29

## 2020-08-29 MED FILL — SUNITINIB MALATE 37.5 MG CA: 37.5 | 21 days supply | Qty: 14 | Fill #0

## 2020-09-05 ENCOUNTER — Ambulatory Visit: Payer: Medicare Other | Admitting: Rheumatology

## 2020-09-05 ENCOUNTER — Other Ambulatory Visit: Payer: Self-pay

## 2020-09-05 ENCOUNTER — Encounter: Payer: Self-pay | Admitting: Rheumatology

## 2020-09-05 VITALS — BP 159/99 | HR 46 | Ht 69.75 in | Wt 181.0 lb

## 2020-09-05 DIAGNOSIS — Z8679 Personal history of other diseases of the circulatory system: Secondary | ICD-10-CM

## 2020-09-05 DIAGNOSIS — Z79899 Other long term (current) drug therapy: Secondary | ICD-10-CM

## 2020-09-05 DIAGNOSIS — M19041 Primary osteoarthritis, right hand: Secondary | ICD-10-CM

## 2020-09-05 DIAGNOSIS — R5383 Other fatigue: Secondary | ICD-10-CM

## 2020-09-05 DIAGNOSIS — M24521 Contracture, right elbow: Secondary | ICD-10-CM

## 2020-09-05 DIAGNOSIS — M19042 Primary osteoarthritis, left hand: Secondary | ICD-10-CM

## 2020-09-05 DIAGNOSIS — C649 Malignant neoplasm of unspecified kidney, except renal pelvis: Secondary | ICD-10-CM

## 2020-09-05 DIAGNOSIS — I10 Essential (primary) hypertension: Secondary | ICD-10-CM | POA: Diagnosis not present

## 2020-09-05 DIAGNOSIS — I8501 Esophageal varices with bleeding: Secondary | ICD-10-CM

## 2020-09-05 DIAGNOSIS — Z8639 Personal history of other endocrine, nutritional and metabolic disease: Secondary | ICD-10-CM

## 2020-09-05 DIAGNOSIS — E890 Postprocedural hypothyroidism: Secondary | ICD-10-CM

## 2020-09-05 DIAGNOSIS — M0600 Rheumatoid arthritis without rheumatoid factor, unspecified site: Secondary | ICD-10-CM

## 2020-09-05 DIAGNOSIS — M51369 Other intervertebral disc degeneration, lumbar region without mention of lumbar back pain or lower extremity pain: Secondary | ICD-10-CM

## 2020-09-05 DIAGNOSIS — I739 Peripheral vascular disease, unspecified: Secondary | ICD-10-CM

## 2020-09-05 DIAGNOSIS — M5030 Other cervical disc degeneration, unspecified cervical region: Secondary | ICD-10-CM

## 2020-09-05 DIAGNOSIS — C73 Malignant neoplasm of thyroid gland: Secondary | ICD-10-CM

## 2020-09-05 DIAGNOSIS — M1990 Unspecified osteoarthritis, unspecified site: Secondary | ICD-10-CM

## 2020-09-05 DIAGNOSIS — M5136 Other intervertebral disc degeneration, lumbar region: Secondary | ICD-10-CM

## 2020-09-05 DIAGNOSIS — M1380 Other specified arthritis, unspecified site: Secondary | ICD-10-CM

## 2020-09-05 DIAGNOSIS — R7612 Nonspecific reaction to cell mediated immunity measurement of gamma interferon antigen response without active tuberculosis: Secondary | ICD-10-CM

## 2020-09-05 DIAGNOSIS — N289 Disorder of kidney and ureter, unspecified: Secondary | ICD-10-CM

## 2020-09-05 DIAGNOSIS — M170 Bilateral primary osteoarthritis of knee: Secondary | ICD-10-CM

## 2020-09-05 DIAGNOSIS — E782 Mixed hyperlipidemia: Secondary | ICD-10-CM

## 2020-09-05 DIAGNOSIS — C7801 Secondary malignant neoplasm of right lung: Secondary | ICD-10-CM

## 2020-09-17 NOTE — Progress Notes (Signed)
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 2) Remote history of metastatic renal cell carcinoma Diagnosed with renal cell carcinoma 20 years ago and had a right nephrectomy. About 8 years after that he was noted to have abdominal recurrence in his pancreas spleen and small intestine and had a Whipple's procedure and significant abdominal surgery and notes that 10 out of 17 lymph nodes were positive. He also had his gallbladder removed. Postoperative course was complicated by an internal hemorrhage as per his report. Patient notes 2-3 years after that he had recurrence in his thyroid that led to a thyroidectomy. [June 2004] later he had partial gastrectomy for local recurrence. [February 2005] when he presented with GI bleeding. Patient notes that he has had no known evidence of recurrence over the last 8 years until his recent CT scan showed lung nodules.  Current Treatment: Sutent 37.5 mg po daily for 2 weeks on and 1 weeks off.  Previous treatment Sutent on cycle 1 - 50 mg by mouth daily for 4 weeks on and 2-weeks off. It was dose adjusted to 37.5 mg by mouth daily for 2 weeks with 1 week of to help mitigate issues with fatigue, mild hyperbilirubinemia, cytopenias. SBRT to dominant RML nodule.   INTERVAL HISTORY:  Dylan Jefferson is here for for his scheduled follow-up of his metastatic renal cell carcinoma. Pt is joined by his wife. The patient's last visit with [place] was on 07/24/2020. The pt reports that he is doing well overall.  The pt reports that he went to Dr. Estanislado Pandy and she told him that he has bone against bone.  She felt it was more degenerative arthritis and not inflammatory. She suggested that Dr. Sharlett Iles and Dr. Irene Limbo manage this, as there is not much she can do at this time. Other than that, the pt has noted some changes in medication from his list. The pt notes no new concerns or symptoms. The pt notes that he keeps a daily BP journal and this has been stable. The pt notes he had a Urine test on 09/04/2020 and was told he had 5-6 g of protein in the urine.  Lab results today 09/18/2020 of CBC w/diff and CMP is as follows: all values are WNL except for RBC of 3.78, MCV of 103.7, MCH of 35.7, RDW of 17.0, Glucose of 218, Creatinine of 2.54, Calcium of 7.5, Total Protein of 4.5, Albumin of 2.1, AST of 52, ALT of 52, Alkaline Phosphatase of 212, GFR est of 26, Anion gap of 4.  On review of systems, pt reports leg swelling, and denies SOB, abdominal pain, back pain and any other symptoms.  Past Medical History:  Diagnosis Date  . Arthritis   . Cancer [facility])    Renal cell  . DDD (degenerative disc disease), cervical 05/28/2016  . DDD (degenerative disc disease), lumbar 05/28/2016  . Depression   . Diabetes ([place])   . Hyperlipidemia 05/28/2016  . Hypertension   . Hypothyroidism   . 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 Kidney disease   . Macular degeneration    Bilateral eyes  . Nephrolithiasis   . Osteoarthritis of  both hands 05/28/2016  . Osteoarthritis of both knees 05/28/2016  . Peripheral vascular disorder ([place]) 05/28/2016  . 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 Rheumatoid arthritis ([place])   . Thyroid cancer ([place]) 05/28/2016    . Past Surgical History:  Procedure Laterality Date  . CATARACT EXTRACTION Bilateral 2013  . CHOLECYSTECTOMY    . GASTRECTOMY     tumor removed  . LIPOMA EXCISION Right 1999  . NEPHRECTOMY Right   . PANCREATECTOMY    . SPLENECTOMY    . THYROIDECTOMY    . URETERAL STENT PLACEMENT Left 2012  . WHIPPLE  PROCEDURE      . Social History   Tobacco Use  . Smoking status: Former Smoker    Years: 1.50  . Smokeless tobacco: Never Used  Vaping Use  . Vaping Use: Never used  Substance Use Topics  . Alcohol use: Yes    Comment: occasional beer  . Drug use: No    ALLERGIES:  is allergic to iodine and other.  MEDICATIONS:  Current Outpatient Medications  Medication Sig Dispense Refill  . amLODipine (NORVASC) 10 MG tablet take 1 tablet by mouth once daily 90 tablet 3  . aspirin 81 MG tablet Take 81 mg by mouth daily.     Marland Kitchen atenolol (TENORMIN) 100 MG tablet Take 100 mg by mouth daily.     Marland Kitchen augmented betamethasone dipropionate (DIPROLENE-AF) 0.05 % cream Apply 1 application topically daily as needed (rash).   0  . betamethasone dipropionate 0.05 % cream Apply topically 2 (two) times daily.    . calcium citrate-vitamin D (CITRACAL+D) 315-200 MG-UNIT per tablet Take 2 tablets by mouth 2 (two) times daily.     . Cholecalciferol (VITAMIN D-3 PO) Take 1,000 Units by mouth daily.    . Cyanocobalamin (VITAMIN B-12) 2500 MCG SUBL Take 2,500 mcg by mouth daily.     . diclofenac Sodium (VOLTAREN) 1 % GEL Apply 3 g topically 3 (three) times daily as needed (joint pain).     Marland Kitchen docusate sodium (COLACE) 100 MG capsule Take 100 mg by mouth 2 (two) times daily as needed for mild constipation.     . dorzolamide-timolol (COSOPT) 22.3-6.8 MG/ML ophthalmic solution Place 1 drop into both eyes 2 (two) times daily.   1  . doxazosin (CARDURA) 2 MG tablet Take 2 mg by mouth daily. (Patient not taking: Reported on 09/05/2020)    . famotidine (PEPCID) 20 MG tablet Take 20 mg by mouth 3 (three) times daily.    . ferrous sulfate 325 (65 FE) MG tablet Take 325 mg by mouth 2 (two) times daily with a meal.     . gabapentin (NEURONTIN) 300 MG capsule Take 300 mg by mouth at bedtime.    . Glucosamine-MSM-Hyaluronic Acd 750-375-30 MG TABS Take 2 tablets by mouth daily.    . insulin lispro (HUMALOG) 100 UNIT/ML injection  Inject 2-4 Units into the skin 3 (three) times daily before meals. Per sliding scale    . Iron-Vitamins (GERITOL COMPLETE) TABS Take 1 tablet by mouth daily.    . Lactobacillus (ACIDOPHILUS) CAPS capsule Take 1 capsule by mouth daily.    Marland Kitchen latanoprost (XALATAN) 0.005 % ophthalmic solution Place 1 drop into both eyes at bedtime.   1  . LEVEMIR 100 UNIT/ML injection Inject 4 Units into the skin 2 (two) times daily.   1  . levothyroxine (SYNTHROID, LEVOTHROID) 300 MCG tablet Take 300 mcg by mouth daily before breakfast.     . losartan (COZAAR) 25 MG tablet  Take 25 mg by mouth daily.    Marland Kitchen lovastatin (MEVACOR) 40 MG tablet Take 40 mg by mouth at bedtime.     . Multiple Vitamins-Minerals (PRESERVISION AREDS 2+MULTI VIT PO) Take 1 tablet by mouth in the morning and at bedtime.    . Omega-3 Fatty Acids (FISH OIL) 1200 MG CAPS Take 1 capsule by mouth daily.    . Pancrelipase, Lip-Prot-Amyl, 6000-19000 units CPEP Take 4 capsules by mouth 3 (three) times daily.    . SUNItinib (SUTENT) 37.5 MG capsule TAKE 1 CAPSULE (37.5 MG) BY MOUTH ONCE DAILY FOR 2 WEEKS ON, 1 WEEK OFF 28 capsule 11  . traMADol (ULTRAM) 50 MG tablet Take 50 mg by mouth 2 (two) times daily as needed.     No current facility-administered medications for this visit.    REVIEW OF SYSTEMS:   10 Point review of Systems was done is negative except as noted above.  PHYSICAL EXAMINATION: ECOG FS:1 - Symptomatic but completely ambulatory  Vitals:   09/18/20 1142  BP: 139/76  Pulse: (!) 51  Resp: 18  Temp: (!) 97 F (36.1 C)  SpO2: 98%   Wt Readings from Last 3 Encounters:  09/18/20 186 lb 4.8 oz (84.5 kg)  09/05/20 181 lb (82.1 kg)  07/24/20 176 lb 11.2 oz (80.2 kg)   Body mass index is 26.92 kg/m.       GENERAL:alert, in no acute distress and comfortable SKIN: no acute rashes, no significant lesions EYES: conjunctiva are pink and non-injected, sclera anicteric OROPHARYNX: MMM, no exudates, no oropharyngeal erythema or  ulceration NECK: supple, no JVD LYMPH:  no palpable lymphadenopathy in the cervical, axillary or inguinal regions LUNGS: clear to auscultation b/l with normal respiratory effort HEART: regular rate & rhythm ABDOMEN:  normoactive bowel sounds , non tender, not distended. Extremity: no pedal edema PSYCH: alert & oriented x 3 with fluent speech NEURO: no focal motor/sensory deficits  LABORATORY DATA:   I have reviewed the data as listed  . CBC Latest Ref Rng & Units 09/18/2020 07/24/2020 06/26/2020  WBC 4.0 - 10.5 K/uL 5.2 5.7 7.0  Hemoglobin 13.0 - 17.0 g/dL 13.5 14.8 14.7  Hematocrit 39.0 - 52.0 % 39.2 43.7 44.6  Platelets 150 - 400 K/uL 169 161 261  ANC 1.4k . CBC    Component Value Date/Time   WBC 5.2 09/18/2020 1112   RBC 3.78 (L) 09/18/2020 1112   HGB 13.5 09/18/2020 1112   HGB 13.3 02/28/2019 1243   HGB 14.0 06/11/2017 0808   HCT 39.2 09/18/2020 1112   HCT 40.7 06/11/2017 0808   PLT 169 09/18/2020 1112   PLT 173 02/28/2019 1243   PLT 223 06/11/2017 0808   MCV 103.7 (H) 09/18/2020 1112   MCV 112.1 (H) 06/11/2017 0808   MCH 35.7 (H) 09/18/2020 1112   MCHC 34.4 09/18/2020 1112   RDW 17.0 (H) 09/18/2020 1112   RDW 13.9 06/11/2017 0808   LYMPHSABS 2.4 09/18/2020 1112   LYMPHSABS 1.6 06/11/2017 0808   MONOABS 0.4 09/18/2020 1112   MONOABS 0.5 06/11/2017 0808   EOSABS 0.3 09/18/2020 1112   EOSABS 0.1 06/11/2017 0808   BASOSABS 0.1 09/18/2020 1112   BASOSABS 0.0 06/11/2017 0808   . CMP Latest Ref Rng & Units 09/18/2020 07/24/2020 06/26/2020  Glucose 70 - 99 mg/dL 218(H) 85 109(H)  BUN 8 - 23 mg/dL 19 19 25(H)  Creatinine 0.61 - 1.24 mg/dL 2.54(H) 1.95(H) 1.85(H)  Sodium 135 - 145 mmol/L 141 145 144  Potassium 3.5 -  5.1 mmol/L 4.0 3.6 4.0  Chloride 98 - 111 mmol/L 111 113(H) 113(H)  CO2 22 - 32 mmol/L 26 25 25   Calcium 8.9 - 10.3 mg/dL 7.5(L) 8.6(L) 9.0  Total Protein 6.5 - 8.1 g/dL 4.5(L) 6.0(L) 6.2(L)  Total Bilirubin 0.3 - 1.2 mg/dL 0.6 0.7 0.6  Alkaline Phos  38 - 126 U/L 212(H) 162(H) 133(H)  AST 15 - 41 U/L 52(H) 39 42(H)  ALT 0 - 44 U/L 52(H) 43 59(H)   08/18/2019 CT Abdomen Pelvis Wo Contrast (Accession [id number])   08/18/2019 CT Chest Wo Contrast (Accession [id number])   RADIOGRAPHIC STUDIES: I have personally reviewed the radiological images as listed and agreed with the findings in the report. No results found.   ASSESSMENT & PLAN:   70 y.o. Caucasian male with  #1 Recurrent metastatic Renal cell carcinoma with multiple lung metastases.  Has been on stable on 1st line Sutent for >67yr. Has had SBRT to the dominant right lung nodule with partial response. No lab or clinical evidence of metastatic RCC progression at this time.  PET/CT 08/08/2016 with a couple of mildly enlarge LLL pulmonary nodules - no other overt evidence of RCC progression at this time.  PET CT scan 11/06/2016 - no overt evidence of RCC progression at this time .  PET/CT 03/13/2017 - no overt evidence of RCC progression at this time .  PET/CT 08/10/2017 - no overt evidence of RCC progression at this time .  PET/ CT done 08/10/2017, shows no evidence of  Progression at this time.  12/07/17 PET which revealed No findings suspicious for recurrent or metastatic disease.  03/22/18 PET/CT revealed One of the left lower lobe nodules has very minimally enlarged over the last 2 years, currently 0.6 by 0.4 cm and measuring 0.4 by 0.3 cm on 03/20/2016. This probably represents a previously treated metastatic nodule given that it measured 1.0 by 0.8 cm on 06/06/2015. This nodule may be subject to some slice selection phenomenon and also motion artifact given proximity to the diaphragm. Surveillance is likely warranted. 2. Chronic central lucency, marginal bony deposition, and slow healing of the right lateral fourth rib fracture. Possibilities may include underlying radiation necrosis as a cause for fracture leading to slow healing, or a low-grade pathologic lesion. Maximum SUV at  this fracture site is 2.9, previously 2.4. 3. 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 There is only low-grade activity in this vicinity characteristic of radiation fibrosis, without focal activity. 4. Other imaging findings of potential clinical significance: Aortic Atherosclerosis. Coronary atherosclerosis. Mild cardiomegaly. Right nephrectomy. 8 mm nonobstructive calculus in the left kidney lower pole.   08/24/18 CT C/A/P revealed "Stable small left lower lobe pulmonary nodules from recent prior studies. As previously noted, these have decreased in size from PET-CT 06/06/2015, likely treated metastases. No new or enlarging pulmonary nodules. 2. Postsurgical changes in the upper abdomen without evidence of local recurrence or metastatic disease. 3. Nonobstructing left renal calculus. 4. Stable radiation changes in the right upper lobe with probable chronic pathologic fracture of the right 4th rib laterally."  03/07/2019 CT C/A wo contrast revealed "1. Stable left lower lobe pulmonary nodules, likely treated Metastases. 2. No additional evidence of metastatic disease. 3. Left renal stone. 4. Post radiation scarring in the right hemithorax with a healed adjacent right rib fracture which is presumably pathologic in Etiology. 5.  Aortic atherosclerosis (ICD10-170.0). 6. 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 Confirmed by [place].  #4 mild thrombocytopenia likely related to Sutent- resolved with dose reduction. #5 Abnormal Liver function test - significant bump in transaminases. -resolved on labs today #6 grade 1 fatigue due to Sutent -better with dose reduction and changing schedule to 2 week on and 1 week. #7 Dysguesia from Sutent - manageable per patient. He is trying to eat as well as he can and is  maintaining his body weight.  #8 RBC macrocytosis due to Sutent #9 chronic kidney disease/single kidney - creatinine stable @ 1.5-1.9  Recommended to avoid NSAIDS and other nephrotoxic medications. Optimize control of DM2 #10 rheumatoid arthritis -follows with Dr. Estanislado Pandy for mx #11 DM2 -continue f/u with Dr Philip Aspen for continue mx, blood sugar at home per patient.    PLAN: -Discussed pt labwork today, 09/18/2020; stable blood counts, Creatinine elevated, Albumin decreased. Hgb, WBC, Plt normal. Blood sugars elevated. Liver functions borderline. -Discussed Rheumatoid Arthritis's role in vasculitis. This would be role for kidney biopsy. -Advised pt leaking kidneys are priority at this time. This is given results of CT scan. -Advised pt that decreased albumin means leaking protein in the urine or holding onto more fluid.  -Advised pt that Sutent can cause fluid retention and leakiness of kidneys. Diabetes is much more likely to be the cause of leaking. -Recommended pt wear compression socks to minimize swelling in legs. -Discussed COVID19 and asymptomatic carriers even when boosted. -No lab or clinical evidence of renal cell carcinoma recurrence at this time. Will continue watchful observation. -Advised pt to HOLD 37.5 mg Sutent 2 weeks on 1 week off. -Will get CT Chest/Abd/Pel wo contrast in 1 week. Will hold Sutent if stable and no progression on results. -Will see back in 2 weeks via phone.   FOLLOW UP: CT chest/abd/pelvis in 1 week Phone visit in 2 weeks  The total time spent in the appointment was 20 minutes and more than 50% was on counseling and direct patient cares.  All of the patient's questions were answered with apparent satisfaction. The patient knows to call the clinic with any problems, questions or concerns.   Sullivan Lone MD [place] AAHIVMS [facility] Hematology/Oncology Physician [facility]  (Office):       [phone] (Work cell):  [phone] (Fax):            [phone]  I, Reinaldo Raddle, am acting as scribe for Dr. Sullivan Lone, MD.      .I have reviewed the above documentation for accuracy and completeness, and I agree with the above. Brunetta Genera MD

## 2020-09-18 ENCOUNTER — Other Ambulatory Visit: Payer: Self-pay

## 2020-09-18 ENCOUNTER — Inpatient Hospital Stay: Payer: Medicare Other | Admitting: Hematology

## 2020-09-18 ENCOUNTER — Inpatient Hospital Stay: Payer: Medicare Other | Attending: Hematology

## 2020-09-18 VITALS — BP 139/76 | HR 51 | Temp 97.0°F | Resp 18 | Ht 69.75 in | Wt 186.3 lb

## 2020-09-18 DIAGNOSIS — C649 Malignant neoplasm of unspecified kidney, except renal pelvis: Secondary | ICD-10-CM | POA: Diagnosis not present

## 2020-09-18 DIAGNOSIS — C641 Malignant neoplasm of right kidney, except renal pelvis: Secondary | ICD-10-CM | POA: Diagnosis present

## 2020-09-18 DIAGNOSIS — M069 Rheumatoid arthritis, unspecified: Secondary | ICD-10-CM | POA: Insufficient documentation

## 2020-09-18 DIAGNOSIS — Z794 Long term (current) use of insulin: Secondary | ICD-10-CM | POA: Insufficient documentation

## 2020-09-18 DIAGNOSIS — I10 Essential (primary) hypertension: Secondary | ICD-10-CM | POA: Insufficient documentation

## 2020-09-18 DIAGNOSIS — C7800 Secondary malignant neoplasm of unspecified lung: Secondary | ICD-10-CM | POA: Diagnosis present

## 2020-09-18 DIAGNOSIS — Z79899 Other long term (current) drug therapy: Secondary | ICD-10-CM | POA: Insufficient documentation

## 2020-09-18 DIAGNOSIS — E039 Hypothyroidism, unspecified: Secondary | ICD-10-CM | POA: Diagnosis not present

## 2020-09-18 DIAGNOSIS — E119 Type 2 diabetes mellitus without complications: Secondary | ICD-10-CM | POA: Insufficient documentation

## 2020-09-18 DIAGNOSIS — C7900 Secondary malignant neoplasm of unspecified kidney and renal pelvis: Secondary | ICD-10-CM

## 2020-09-18 DIAGNOSIS — Z7982 Long term (current) use of aspirin: Secondary | ICD-10-CM | POA: Diagnosis not present

## 2020-09-18 DIAGNOSIS — Z87891 Personal history of nicotine dependence: Secondary | ICD-10-CM | POA: Insufficient documentation

## 2020-09-18 DIAGNOSIS — M170 Bilateral primary osteoarthritis of knee: Secondary | ICD-10-CM | POA: Diagnosis present

## 2020-09-18 LAB — CMP (CANCER CENTER ONLY)
ALT: 52 U/L — ABNORMAL HIGH (ref 0–44)
AST: 52 U/L — ABNORMAL HIGH (ref 15–41)
Albumin: 2.1 g/dL — ABNORMAL LOW (ref 3.5–5.0)
Alkaline Phosphatase: 212 U/L — ABNORMAL HIGH (ref 38–126)
Anion gap: 4 — ABNORMAL LOW (ref 5–15)
BUN: 19 mg/dL (ref 8–23)
CO2: 26 mmol/L (ref 22–32)
Calcium: 7.5 mg/dL — ABNORMAL LOW (ref 8.9–10.3)
Chloride: 111 mmol/L (ref 98–111)
Creatinine: 2.54 mg/dL — ABNORMAL HIGH (ref 0.61–1.24)
GFR, Estimated: 26 mL/min — ABNORMAL LOW (ref >60)
Glucose, Bld: 218 mg/dL — ABNORMAL HIGH (ref 70–99)
Potassium: 4 mmol/L (ref 3.5–5.1)
Sodium: 141 mmol/L (ref 135–145)
Total Bilirubin: 0.6 mg/dL (ref 0.3–1.2)
Total Protein: 4.5 g/dL — ABNORMAL LOW (ref 6.5–8.1)

## 2020-09-18 LAB — CBC WITH DIFFERENTIAL/PLATELET
Abs Immature Granulocytes: 0 10*3/uL (ref 0.00–0.07)
Basophils Absolute: 0.1 10*3/uL (ref 0.0–0.1)
Basophils Relative: 1 %
Eosinophils Absolute: 0.3 10*3/uL (ref 0.0–0.5)
Eosinophils Relative: 5 %
HCT: 39.2 % (ref 39.0–52.0)
Hemoglobin: 13.5 g/dL (ref 13.0–17.0)
Immature Granulocytes: 0 %
Lymphocytes Relative: 47 %
Lymphs Abs: 2.4 10*3/uL (ref 0.7–4.0)
MCH: 35.7 pg — ABNORMAL HIGH (ref 26.0–34.0)
MCHC: 34.4 g/dL (ref 30.0–36.0)
MCV: 103.7 fL — ABNORMAL HIGH (ref 80.0–100.0)
Monocytes Absolute: 0.4 10*3/uL (ref 0.1–1.0)
Monocytes Relative: 8 %
Neutro Abs: 2 10*3/uL (ref 1.7–7.7)
Neutrophils Relative %: 39 %
Platelets: 169 10*3/uL (ref 150–400)
RBC: 3.78 MIL/uL — ABNORMAL LOW (ref 4.22–5.81)
RDW: 17 % — ABNORMAL HIGH (ref 11.5–15.5)
WBC: 5.2 10*3/uL (ref 4.0–10.5)
nRBC: 0 % (ref 0.0–0.2)

## 2020-09-18 MED FILL — SUNITINIB MALATE 37.5 MG CA: 37.5 | 21 days supply | Qty: 14 | Fill #1

## 2020-10-03 ENCOUNTER — Encounter (HOSPITAL_COMMUNITY): Payer: Self-pay

## 2020-10-03 ENCOUNTER — Other Ambulatory Visit: Payer: Self-pay

## 2020-10-03 ENCOUNTER — Ambulatory Visit (HOSPITAL_COMMUNITY)
Admission: RE | Admit: 2020-10-03 | Discharge: 2020-10-03 | Disposition: A | Discharge status: Home / Self Care | Payer: Medicare Other | Source: Ambulatory Visit | Attending: Hematology | Admitting: Hematology

## 2020-10-03 DIAGNOSIS — X58XXXA Exposure to other specified factors, initial encounter: Secondary | ICD-10-CM | POA: Diagnosis not present

## 2020-10-03 DIAGNOSIS — R918 Other nonspecific abnormal finding of lung field: Secondary | ICD-10-CM | POA: Diagnosis not present

## 2020-10-03 DIAGNOSIS — C649 Malignant neoplasm of unspecified kidney, except renal pelvis: Secondary | ICD-10-CM

## 2020-10-03 DIAGNOSIS — S2231XA Fracture of one rib, right side, initial encounter for closed fracture: Secondary | ICD-10-CM | POA: Diagnosis not present

## 2020-10-03 DIAGNOSIS — Z923 Personal history of irradiation: Secondary | ICD-10-CM | POA: Diagnosis not present

## 2020-10-03 DIAGNOSIS — N200 Calculus of kidney: Secondary | ICD-10-CM | POA: Diagnosis not present

## 2020-10-03 DIAGNOSIS — I700 Atherosclerosis of aorta: Secondary | ICD-10-CM | POA: Diagnosis not present

## 2020-10-03 DIAGNOSIS — Z905 Acquired absence of kidney: Secondary | ICD-10-CM | POA: Diagnosis not present

## 2020-10-03 NOTE — Progress Notes (Signed)
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 2) Remote history of metastatic renal cell carcinoma Diagnosed with renal cell carcinoma 20 years ago and had a right nephrectomy. About 8 years after that he was noted to have abdominal recurrence in his pancreas spleen and small intestine and had a Whipple's procedure and significant abdominal surgery and notes that 10 out of 17 lymph nodes were positive. He also had his gallbladder removed. Postoperative course was complicated by an internal hemorrhage as per his report. Patient notes 2-3 years after that he had recurrence in his thyroid that led to a thyroidectomy. [June 2004] later he had partial gastrectomy for local recurrence. [February 2005] when he presented with GI bleeding. Patient notes that he has had no known evidence of recurrence over the last 8 years until his recent CT scan showed lung nodules.  Current Treatment: Sutent 37.5 mg po daily for 2 weeks on and 1 weeks off. (on hold now from 10/2020) Previous treatment Sutent on cycle 1 - 50 mg by mouth daily for 4 weeks on and 2-weeks off. It was dose adjusted to 37.5 mg by mouth daily for 2 weeks with 1 week of to help mitigate issues with fatigue, mild hyperbilirubinemia, cytopenias. SBRT to dominant RML nodule.   INTERVAL HISTORY: I connected with Delphina Cahill  on 10/04/2020 by telephone and verified that I am speaking with the correct person using two identifiers.   I discussed the limitations of evaluation and management by telemedicine. The patient expressed understanding and agreed to proceed.   Other persons  participating in the visit and their role in the encounter:                                                         - Reinaldo Raddle, [place], Pt's Wife   Patient's location: Home Provider's location: [place] [facility] is here for for his scheduled follow-up of his metastatic renal cell carcinoma.The patient's last visit with [place] was on 09/18/2020. The pt reports that he is doing well overall.  The pt reports no new symptoms or concerns. He notes that his right rib intermittently causes pain, but is not bothersome. The pt notes the majority of his pain comes from his hip and shoulders. The pt notes he is more concerned with his kidney and liver regarding the leaking protein than receiving another colonoscopy. The pt notes he may be getting a biopsy of his kidney soon, but Dr. Candiss Norse is closely monitoring this.  Of note since the patient's last visit, pt has had CT Chest/Abd/Pel ([id number]) on 10/03/2020, which revealed "1. Prior RIGHT nephrectomy without soft tissue nodularity in the surgical bed. No new or progressive metastatic disease in the chest abdomen or pelvis. 2. Stable tiny left lower lobe pulmonary nodules and post radiation change in the right lung. 3. No significant change in the ununited pathologic anterior right fourth rib fracture  with underlying mixed lytic and sclerotic change. 4. Similar right colonic wall thickening, which is nonspecific and for which further evaluation with colonoscopy may be useful if clinically indicated and not previously performed. 5. Nonobstructive left nephrolithiasis. 6. Aortic atherosclerosis.  Aortic Atherosclerosis (ICD10-I70.0)."  On review of systems, pt denies changes in bowel function, bowel symptoms, bothersome rib pain, and any other symptoms.  Past Medical History:  Diagnosis Date  . Arthritis   . Cancer [facility])    Renal cell  . DDD (degenerative disc disease), cervical 05/28/2016  . DDD (degenerative  disc disease), lumbar 05/28/2016  . Depression   . Diabetes ([place])   . Hyperlipidemia 05/28/2016  . Hypertension   . Hypothyroidism   . 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 Kidney disease   . Macular degeneration    Bilateral eyes  . Nephrolithiasis   . Osteoarthritis of both hands 05/28/2016  . Osteoarthritis of both knees 05/28/2016  . Peripheral vascular disorder ([place]) 05/28/2016  . 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 Rheumatoid arthritis ([place])   . Thyroid cancer ([place]) 05/28/2016    . Past Surgical History:  Procedure Laterality Date  . CATARACT EXTRACTION Bilateral 2013  . CHOLECYSTECTOMY    . GASTRECTOMY     tumor removed  . LIPOMA EXCISION Right 1999  . NEPHRECTOMY Right   . PANCREATECTOMY    . SPLENECTOMY    . THYROIDECTOMY    . URETERAL STENT PLACEMENT Left 2012  . WHIPPLE PROCEDURE      . Social History   Tobacco Use  . Smoking status: Former Smoker    Years: 1.50  . Smokeless tobacco: Never Used  Vaping Use  . Vaping Use: Never used  Substance Use Topics  . Alcohol use: Yes    Comment: occasional beer  . Drug use: No    ALLERGIES:  is allergic to iodine and other.  MEDICATIONS:  Current Outpatient Medications  Medication Sig Dispense Refill  . amLODipine (NORVASC) 10 MG tablet take 1 tablet by mouth once daily 90 tablet 3  . aspirin 81 MG tablet Take 81 mg by mouth daily.     Marland Kitchen atenolol (TENORMIN) 100 MG tablet Take 100 mg by mouth daily.     Marland Kitchen augmented betamethasone dipropionate (DIPROLENE-AF) 0.05 % cream Apply 1 application topically daily as needed (rash).   0  . calcium citrate-vitamin D (CITRACAL+D) 315-200 MG-UNIT per tablet Take 2 tablets by mouth 2 (two) times daily.     . Cholecalciferol (VITAMIN D-3 PO) Take 1,000 Units by mouth daily.    . Cyanocobalamin (VITAMIN B-12) 2500 MCG SUBL Take 2,500 mcg by mouth daily.     . diclofenac Sodium (VOLTAREN) 1 % GEL Apply 3 g topically 3  (three) times daily as needed (joint pain).     Marland Kitchen docusate sodium (COLACE) 100 MG capsule Take 100 mg by mouth 2 (two) times daily as needed for mild constipation.     . dorzolamide-timolol (COSOPT) 22.3-6.8 MG/ML ophthalmic solution Place 1 drop into both eyes 2 (two) times daily.   1  . doxazosin (CARDURA) 2 MG tablet Take 2 mg by mouth daily. (Patient not taking: No sig reported)    . famotidine (PEPCID) 20 MG tablet Take 20 mg by mouth 3 (three) times daily.    . ferrous sulfate 325 (65 FE) MG tablet Take 325 mg by mouth 2 (two) times daily with a meal.     . gabapentin (NEURONTIN) 300 MG  capsule Take 300 mg by mouth at bedtime.    . Glucosamine-MSM-Hyaluronic Acd 750-375-30 MG TABS Take 2 tablets by mouth daily.    . insulin lispro (HUMALOG) 100 UNIT/ML injection Inject 2-4 Units into the skin 3 (three) times daily before meals. Per sliding scale    . Iron-Vitamins (GERITOL COMPLETE) TABS Take 1 tablet by mouth daily.    . Lactobacillus (ACIDOPHILUS) CAPS capsule Take 1 capsule by mouth daily.    Marland Kitchen latanoprost (XALATAN) 0.005 % ophthalmic solution Place 1 drop into both eyes at bedtime.   1  . LEVEMIR 100 UNIT/ML injection Inject 4 Units into the skin 2 (two) times daily.   1  . levothyroxine (SYNTHROID, LEVOTHROID) 300 MCG tablet Take 300 mcg by mouth daily before breakfast.     . losartan (COZAAR) 25 MG tablet Take 25 mg by mouth daily.    Marland Kitchen lovastatin (MEVACOR) 40 MG tablet Take 40 mg by mouth at bedtime.     . Multiple Vitamins-Minerals (PRESERVISION AREDS 2+MULTI VIT PO) Take 1 tablet by mouth in the morning and at bedtime.    . Omega-3 Fatty Acids (FISH OIL) 1200 MG CAPS Take 1 capsule by mouth daily.    . Pancrelipase, Lip-Prot-Amyl, 6000-19000 units CPEP Take 4 capsules by mouth 3 (three) times daily.    . SUNItinib (SUTENT) 37.5 MG capsule TAKE 1 CAPSULE (37.5 MG) BY MOUTH ONCE DAILY FOR 2 WEEKS ON, 1 WEEK OFF 28 capsule 11  . traMADol (ULTRAM) 50 MG tablet Take 50 mg by mouth 2  (two) times daily as needed.     No current facility-administered medications for this visit.    REVIEW OF SYSTEMS:   10 Point review of Systems was done is negative except as noted above.  PHYSICAL EXAMINATION: ECOG FS:1 - Symptomatic but completely ambulatory  There were no vitals filed for this visit. Wt Readings from Last 3 Encounters:  09/18/20 186 lb 4.8 oz (84.5 kg)  09/05/20 181 lb (82.1 kg)  07/24/20 176 lb 11.2 oz (80.2 kg)   There is no height or weight on file to calculate BMI.      Telehealth Visit.  LABORATORY DATA:   I have reviewed the data as listed  . CBC Latest Ref Rng & Units 09/18/2020 07/24/2020 06/26/2020  WBC 4.0 - 10.5 K/uL 5.2 5.7 7.0  Hemoglobin 13.0 - 17.0 g/dL 13.5 14.8 14.7  Hematocrit 39.0 - 52.0 % 39.2 43.7 44.6  Platelets 150 - 400 K/uL 169 161 261  ANC 1.4k . CBC    Component Value Date/Time   WBC 5.2 09/18/2020 1112   RBC 3.78 (L) 09/18/2020 1112   HGB 13.5 09/18/2020 1112   HGB 13.3 02/28/2019 1243   HGB 14.0 06/11/2017 0808   HCT 39.2 09/18/2020 1112   HCT 40.7 06/11/2017 0808   PLT 169 09/18/2020 1112   PLT 173 02/28/2019 1243   PLT 223 06/11/2017 0808   MCV 103.7 (H) 09/18/2020 1112   MCV 112.1 (H) 06/11/2017 0808   MCH 35.7 (H) 09/18/2020 1112   MCHC 34.4 09/18/2020 1112   RDW 17.0 (H) 09/18/2020 1112   RDW 13.9 06/11/2017 0808   LYMPHSABS 2.4 09/18/2020 1112   LYMPHSABS 1.6 06/11/2017 0808   MONOABS 0.4 09/18/2020 1112   MONOABS 0.5 06/11/2017 0808   EOSABS 0.3 09/18/2020 1112   EOSABS 0.1 06/11/2017 0808   BASOSABS 0.1 09/18/2020 1112   BASOSABS 0.0 06/11/2017 0808   . CMP Latest Ref Rng & Units 09/18/2020 07/24/2020 06/26/2020  Glucose 70 - 99 mg/dL 218(H) 85 109(H)  BUN 8 - 23 mg/dL 19 19 25(H)  Creatinine 0.61 - 1.24 mg/dL 2.54(H) 1.95(H) 1.85(H)  Sodium 135 - 145 mmol/L 141 145 144  Potassium 3.5 - 5.1 mmol/L 4.0 3.6 4.0  Chloride 98 - 111 mmol/L 111 113(H) 113(H)  CO2 22 - 32 mmol/L 26 25 25   Calcium  8.9 - 10.3 mg/dL 7.5(L) 8.6(L) 9.0  Total Protein 6.5 - 8.1 g/dL 4.5(L) 6.0(L) 6.2(L)  Total Bilirubin 0.3 - 1.2 mg/dL 0.6 0.7 0.6  Alkaline Phos 38 - 126 U/L 212(H) 162(H) 133(H)  AST 15 - 41 U/L 52(H) 39 42(H)  ALT 0 - 44 U/L 52(H) 43 59(H)   08/18/2019 CT Abdomen Pelvis Wo Contrast (Accession [id number])   08/18/2019 CT Chest Wo Contrast (Accession [id number])   RADIOGRAPHIC STUDIES: I have personally reviewed the radiological images as listed and agreed with the findings in the report. No results found.   ASSESSMENT & PLAN:   70 y.o. Caucasian male with  #1 Recurrent metastatic Renal cell carcinoma with multiple lung metastases.  Has been on stable on 1st line Sutent for >95yr. Has had SBRT to the dominant right lung nodule with partial response. No lab or clinical evidence of metastatic RCC progression at this time.  PET/CT 08/08/2016 with a couple of mildly enlarge LLL pulmonary nodules - no other overt evidence of RCC progression at this time.  PET CT scan 11/06/2016 - no overt evidence of RCC progression at this time .  PET/CT 03/13/2017 - no overt evidence of RCC progression at this time .  PET/CT 08/10/2017 - no overt evidence of RCC progression at this time .  PET/ CT done 08/10/2017, shows no evidence of  Progression at this time.  12/07/17 PET which revealed No findings suspicious for recurrent or metastatic disease.  03/22/18 PET/CT revealed One of the left lower lobe nodules has very minimally enlarged over the last 2 years, currently 0.6 by 0.4 cm and measuring 0.4 by 0.3 cm on 03/20/2016. This probably represents a previously treated metastatic nodule given that it measured 1.0 by 0.8 cm on 06/06/2015. This nodule may be subject to some slice selection phenomenon and also motion artifact given proximity to the diaphragm. Surveillance is likely warranted. 2. Chronic central lucency, marginal bony deposition, and slow healing of the right lateral fourth rib fracture.  Possibilities may include underlying radiation necrosis as a cause for fracture leading to slow healing, or a low-grade pathologic lesion. Maximum SUV at this fracture site is 2.9, previously 2.4. 3. 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 There is only low-grade activity in this vicinity characteristic of radiation fibrosis, without focal activity. 4. Other imaging findings of potential clinical significance: Aortic Atherosclerosis. Coronary atherosclerosis. Mild cardiomegaly. Right nephrectomy. 8 mm nonobstructive calculus in the left kidney lower pole.   08/24/18 CT C/A/P revealed "Stable small left lower lobe pulmonary nodules from recent prior studies. As previously noted, these have decreased in size from PET-CT 06/06/2015, likely treated metastases. No new or enlarging pulmonary nodules. 2. Postsurgical changes in the upper abdomen without evidence of local recurrence or metastatic disease. 3. Nonobstructing left renal calculus. 4. Stable radiation changes in the right upper lobe with probable chronic pathologic fracture of the right 4th rib laterally."  03/07/2019 CT C/A wo contrast revealed "1. Stable left lower lobe pulmonary nodules, likely treated Metastases. 2. No additional evidence of metastatic  disease. 3. Left renal stone. 4. Post radiation scarring in the right hemithorax with a healed adjacent right rib fracture which is presumably pathologic in Etiology. 5.  Aortic atherosclerosis (ICD10-170.0). 6. 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 Confirmed by [place].  #4 mild thrombocytopenia likely related to Sutent- resolved with dose reduction. #5 Abnormal Liver function test - significant bump in transaminases. -resolved on labs today #6 grade 1 fatigue due to Sutent -better with dose  reduction and changing schedule to 2 week on and 1 week. #7 Dysguesia from Sutent - manageable per patient. He is trying to eat as well as he can and is maintaining his body weight.  #8 RBC macrocytosis due to Sutent #9 chronic kidney disease/single kidney - creatinine stable @ 1.5-1.9  Recommended to avoid NSAIDS and other nephrotoxic medications. Optimize control of DM2 #10 rheumatoid arthritis -follows with Dr. Estanislado Pandy for mx #11 DM2 -continue f/u with Dr Philip Aspen for continue mx, blood sugar at home per patient.    PLAN: -Discussed pt CT Chest/Abd/Pel ([id number]) on 10/03/2020; no new or progressive metastatic disease.  -Advised pt that there are lung nodules that show scarring from previous radiation and left kidney stones unchanged. Still a fourth rib fracture that shows old bone lesions.  -Advised pt that hip and shoulder pain is most likely caused by RA +/- OA -Discussed thickening of wall of pt's colon. Advised pt is unchanged from prior scan, but recommended pt receive colonoscopy.  -No lab or clinical evidence of renal cell carcinoma recurrence at this time. Will continue watchful observation. -Continue to hold the 37.5 mg Sutent at this time-- in the context of NED status from [place] and significant issues with anasarca and nephrotic syndrome. -Will see back in 10 weeks with labs.   FOLLOW UP: RTC with Dr Irene Limbo with labs in 10 weeks  The total time spent in the appointment was 20 minutes and more than 50% was on counseling and direct patient cares.  All of the patient's questions were answered with apparent satisfaction. The patient knows to call the clinic with any problems, questions or concerns.   Sullivan Lone MD [place] AAHIVMS [facility] Hematology/Oncology Physician [facility]  (Office):       [phone] (Work cell):  [phone] (Fax):           [phone]  I, Reinaldo Raddle, am acting as scribe for Dr. Sullivan Lone, MD.   .I have reviewed the above  documentation for accuracy and completeness, and I agree with the above. Brunetta Genera MD

## 2020-10-04 ENCOUNTER — Inpatient Hospital Stay: Payer: Medicare Other | Attending: Hematology | Admitting: Hematology

## 2020-10-04 DIAGNOSIS — C649 Malignant neoplasm of unspecified kidney, except renal pelvis: Secondary | ICD-10-CM | POA: Diagnosis not present

## 2020-10-04 DIAGNOSIS — R601 Generalized edema: Secondary | ICD-10-CM

## 2020-10-12 ENCOUNTER — Telehealth: Payer: Self-pay | Admitting: Hematology

## 2020-10-12 NOTE — Telephone Encounter (Signed)
Scheduled appt per 3/11 sch msg. Spoke to pt who is aware of appt date and time.  

## 2020-11-01 ENCOUNTER — Other Ambulatory Visit (HOSPITAL_COMMUNITY): Payer: Self-pay

## 2020-11-29 ENCOUNTER — Telehealth: Payer: Self-pay | Admitting: *Deleted

## 2020-11-29 NOTE — Telephone Encounter (Signed)
Patient/wife called to report he saw Dr. Shayne Alken Kidney today. Per patient - his kidney function is a little better. Dr. Candiss Norse is also  making changes in his b/p meds. Patient has been experiencing left upper abdominal pain - severe at times. Also having occ episodes of diarrhea. Per patient, Dr. Candiss Norse recommended/referred him to Dr. Philip Aspen for referral for colonoscopy.  Dr. Candiss Norse asked that Dr. Irene Limbo be informed of today's visit. Advised Dylan Jefferson that Dr. Irene Limbo will receive this message. Call information routed to Dr. Irene Limbo.

## 2020-12-07 ENCOUNTER — Telehealth: Payer: Self-pay | Admitting: Gastroenterology

## 2020-12-07 NOTE — Telephone Encounter (Signed)
Good afternoon Dr. Tarri Glenn, we received an urgent referral for abdominal pain for patient. PCP also called requesting for patient to be seen next week if possible. He was previously Dr. Kelby Fam patient, sending to DOD for today.  Please let us know if you need his records.

## 2020-12-10 NOTE — Telephone Encounter (Signed)
My apologizes for the delay. I was not doc of day Friday afternoon. In looking at the schedule, I see openings this week. You may schedule this patient with me or any APP this week.  Thank you.

## 2020-12-10 NOTE — Telephone Encounter (Signed)
Scheduled patient for 12/13/20 with an APP.

## 2020-12-11 NOTE — Progress Notes (Signed)
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 2) Remote history of metastatic renal cell carcinoma Diagnosed with renal cell carcinoma 20 years ago and had a right nephrectomy. About 8 years after that he was noted to have abdominal recurrence in his pancreas spleen and small intestine and had a Whipple's procedure and significant abdominal surgery and notes that 10 out of 17 lymph nodes were positive. He also had his gallbladder removed. Postoperative course was complicated by an internal hemorrhage as per his report. Patient notes 2-3 years after that he had recurrence in his thyroid that led to a thyroidectomy. [June 2004] later he had partial gastrectomy for local recurrence. [February 2005] when he presented with GI bleeding. Patient notes that he has had no known evidence of recurrence over the last 8 years until his recent CT scan showed lung nodules.  Current Treatment: Sutent 37.5 mg po daily for 2 weeks on and 1 weeks off. (on hold now from 10/2020) Previous treatment Sutent on cycle 1 - 50 mg by mouth daily for 4 weeks on and 2-weeks off. It was dose adjusted to 37.5 mg by mouth daily for 2 weeks with 1 week of to help mitigate issues with fatigue, mild hyperbilirubinemia, cytopenias. SBRT to dominant RML nodule.   INTERVAL HISTORY:  Dylan Jefferson is here for for his scheduled follow-up of his metastatic renal cell carcinoma.The patient's last visit with [place] was on 10/04/2020. The pt reports that he is doing well overall.  The pt reports that he will be seeing Amy Esterwood tomorrow (GI) for the pain in his left  abdominal pain and nodule he has. The pt notes he takes a Tramadol prior to bedtime and this still helps, but he still wakes up with "twinging" and having to go to the bathroom. The pt denies having any constipation and notes this spot is tender to the touch. The pt notes this pain is is intermittent and comes around morning time. The pt notes he would have to get up and move, but then would have cold sweats. The pain changed with some food intake. The pt notes he has some intermittent diarrhea in the morning time that has been going on for a few weeks. The pt notes that due to these symptoms, he is very drained, weak, and tired. The pt notes that he has not had the kidney biopsy, as Dr. Candiss Norse wanted to hold off on this due to improvement in his GFR.  The pt notes his last colonoscopy was in 2015.  Lab results today 12/12/2020 of CBC w/diff and CMP is as follows: all values are WNL except for RBC of 3.90, MCV of 104.4, MCH of 34.4, Monocytes Abs of 1.1K, Glucose of 235, BUN of 29, Creatinine of 1.93, Calcium of 8.2, Total protein of 4.7, Albumin of 2.2, AST of 55, ALT of 74, Alkaline Phosphatase of 183, GFR est of 37. 12/12/2020 LDH of 214.  On review of systems, pt reports right abdominal pain, cold sweats, weakness, intermittent diarrhea and denies leg swelling, fevers, worsening fatigue, rib pain, and any other symptoms.  Past Medical History:  Diagnosis Date  . Arthritis   . Cancer ([place])  Renal cell  . DDD (degenerative disc disease), cervical 05/28/2016  . DDD (degenerative disc disease), lumbar 05/28/2016  . Depression   . Diabetes ([place])   . Hyperlipidemia 05/28/2016  . Hypertension   . Hypothyroidism   . 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 Kidney disease   . Macular degeneration    Bilateral eyes  . Nephrolithiasis   . Osteoarthritis of both hands 05/28/2016  . Osteoarthritis of both knees 05/28/2016  . Peripheral  vascular disorder ([place]) 05/28/2016  . 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 Rheumatoid arthritis ([place])   . Thyroid cancer ([place]) 05/28/2016    . Past Surgical History:  Procedure Laterality Date  . CATARACT EXTRACTION Bilateral 2013  . CHOLECYSTECTOMY    . GASTRECTOMY     tumor removed  . LIPOMA EXCISION Right 1999  . NEPHRECTOMY Right   . PANCREATECTOMY    . SPLENECTOMY    . THYROIDECTOMY    . URETERAL STENT PLACEMENT Left 2012  . WHIPPLE PROCEDURE      . Social History   Tobacco Use  . Smoking status: Former Smoker    Years: 1.50  . Smokeless tobacco: Never Used  Vaping Use  . Vaping Use: Never used  Substance Use Topics  . Alcohol use: Yes    Comment: occasional beer  . Drug use: No    ALLERGIES:  is allergic to iodine and other.  MEDICATIONS:  Current Outpatient Medications  Medication Sig Dispense Refill  . aspirin 81 MG tablet Take 81 mg by mouth daily.     Marland Kitchen atenolol (TENORMIN) 100 MG tablet Take 100 mg by mouth daily.     Marland Kitchen augmented betamethasone dipropionate (DIPROLENE-AF) 0.05 % cream Apply 1 application topically daily as needed (rash).   0  . calcium citrate-vitamin D (CITRACAL+D) 315-200 MG-UNIT per tablet Take 2 tablets by mouth 2 (two) times daily.     . Cholecalciferol (VITAMIN D-3 PO) Take 1,000 Units by mouth daily.    . Cyanocobalamin (VITAMIN B-12) 2500 MCG SUBL Take 2,500 mcg by mouth daily.     . diclofenac Sodium (VOLTAREN) 1 % GEL Apply 3 g topically 3 (three) times daily as needed (joint pain).     Marland Kitchen docusate sodium (COLACE) 100 MG capsule Take 100 mg by mouth 2 (two) times daily as needed for mild constipation.     . dorzolamide-timolol (COSOPT) 22.3-6.8 MG/ML ophthalmic solution Place 1 drop into both eyes 2 (two) times daily.   1  . famotidine (PEPCID) 20 MG tablet Take 20 mg by mouth 3 (three) times daily.    . ferrous sulfate 325 (65 FE) MG tablet Take 325 mg by mouth 2 (two) times daily with a meal.     . gabapentin (NEURONTIN) 300 MG  capsule Take 300 mg by mouth at bedtime.    . Glucosamine-MSM-Hyaluronic Acd 750-375-30 MG TABS Take 2 tablets by mouth daily.    . insulin lispro (HUMALOG) 100 UNIT/ML injection Inject 2-4 Units into the skin 3 (three) times daily before meals. Per sliding scale    . Iron-Vitamins (GERITOL COMPLETE) TABS Take 1 tablet by mouth daily.    . Lactobacillus (ACIDOPHILUS) CAPS capsule Take 1 capsule by mouth daily.    Marland Kitchen latanoprost (XALATAN) 0.005 % ophthalmic solution Place 1 drop into both eyes at bedtime.   1  . LEVEMIR 100 UNIT/ML injection Inject 4 Units into the skin 2 (two) times daily.   1  . levothyroxine (SYNTHROID,  LEVOTHROID) 300 MCG tablet Take 300 mcg by mouth daily before breakfast.     . losartan (COZAAR) 25 MG tablet Take 25 mg by mouth daily.    Marland Kitchen lovastatin (MEVACOR) 40 MG tablet Take 40 mg by mouth at bedtime.     . Omega-3 Fatty Acids (FISH OIL) 1200 MG CAPS Take 1 capsule by mouth daily.    . Pancrelipase, Lip-Prot-Amyl, 6000-19000 units CPEP Take 4 capsules by mouth 3 (three) times daily.    . traMADol (ULTRAM) 50 MG tablet Take 50 mg by mouth 2 (two) times daily as needed.    Marland Kitchen amLODipine (NORVASC) 10 MG tablet take 1 tablet by mouth once daily (Patient not taking: Reported on 12/12/2020) 90 tablet 3  . doxazosin (CARDURA) 2 MG tablet Take 2 mg by mouth daily. (Patient not taking: No sig reported)    . HYDROcodone-acetaminophen (NORCO/VICODIN) 5-325 MG tablet Take 1 tablet by mouth every 6 (six) hours as needed.    . Multiple Vitamins-Minerals (PRESERVISION AREDS 2+MULTI VIT PO) Take 1 tablet by mouth in the morning and at bedtime. (Patient not taking: Reported on 12/12/2020)    . SUNItinib (SUTENT) 37.5 MG capsule TAKE 1 CAPSULE BY MOUTH ONCE DAILY FOR 2 WEEKS ON, 1 WEEK OFF (Patient not taking: Reported on 12/12/2020) 28 capsule 11   No current facility-administered medications for this visit.    REVIEW OF SYSTEMS:   10 Point review of Systems was done is negative except as  noted above.  PHYSICAL EXAMINATION: ECOG FS:1 - Symptomatic but completely ambulatory  Vitals:   12/12/20 0912  BP: (!) 148/76  Pulse: (!) 54  Resp: 18  Temp: 97.7 F (36.5 C)  SpO2: 99%   Wt Readings from Last 3 Encounters:  12/12/20 177 lb 8 oz (80.5 kg)  09/18/20 186 lb 4.8 oz (84.5 kg)  09/05/20 181 lb (82.1 kg)   Body mass index is 25.65 kg/m.       GENERAL:alert, in no acute distress and comfortable SKIN: no acute rashes, no significant lesions EYES: conjunctiva are pink and non-injected, sclera anicteric OROPHARYNX: MMM, no exudates, no oropharyngeal erythema or ulceration NECK: supple, no JVD LYMPH:  no palpable lymphadenopathy in the cervical, axillary or inguinal regions LUNGS: clear to auscultation b/l with normal respiratory effort HEART: regular rate & rhythm ABDOMEN:  normoactive bowel sounds , non tender, not distended. Extremity: no pedal edema PSYCH: alert & oriented x 3 with fluent speech NEURO: no focal motor/sensory deficits  LABORATORY DATA:   I have reviewed the data as listed  . CBC Latest Ref Rng & Units 12/12/2020 09/18/2020 07/24/2020  WBC 4.0 - 10.5 K/uL 9.0 5.2 5.7  Hemoglobin 13.0 - 17.0 g/dL 13.4 13.5 14.8  Hematocrit 39.0 - 52.0 % 40.7 39.2 43.7  Platelets 150 - 400 K/uL 233 169 161  ANC 1.4k . CBC    Component Value Date/Time   WBC 9.0 12/12/2020 0844   WBC 5.2 09/18/2020 1112   RBC 3.90 (L) 12/12/2020 0844   HGB 13.4 12/12/2020 0844   HGB 14.0 06/11/2017 0808   HCT 40.7 12/12/2020 0844   HCT 40.7 06/11/2017 0808   PLT 233 12/12/2020 0844   PLT 223 06/11/2017 0808   MCV 104.4 (H) 12/12/2020 0844   MCV 112.1 (H) 06/11/2017 0808   MCH 34.4 (H) 12/12/2020 0844   MCHC 32.9 12/12/2020 0844   RDW 12.9 12/12/2020 0844   RDW 13.9 06/11/2017 0808   LYMPHSABS 1.8 12/12/2020 0844   LYMPHSABS 1.6 06/11/2017  7893   MONOABS 1.1 (H) 12/12/2020 0844   MONOABS 0.5 06/11/2017 0808   EOSABS 0.2 12/12/2020 0844   EOSABS 0.1 06/11/2017  0808   BASOSABS 0.1 12/12/2020 0844   BASOSABS 0.0 06/11/2017 0808   . CMP Latest Ref Rng & Units 12/12/2020 09/18/2020 07/24/2020  Glucose 70 - 99 mg/dL 235(H) 218(H) 85  BUN 8 - 23 mg/dL 29(H) 19 19  Creatinine 0.61 - 1.24 mg/dL 1.93(H) 2.54(H) 1.95(H)  Sodium 135 - 145 mmol/L 141 141 145  Potassium 3.5 - 5.1 mmol/L 4.0 4.0 3.6  Chloride 98 - 111 mmol/L 109 111 113(H)  CO2 22 - 32 mmol/L 24 26 25   Calcium 8.9 - 10.3 mg/dL 8.2(L) 7.5(L) 8.6(L)  Total Protein 6.5 - 8.1 g/dL 4.7(L) 4.5(L) 6.0(L)  Total Bilirubin 0.3 - 1.2 mg/dL 0.7 0.6 0.7  Alkaline Phos 38 - 126 U/L 183(H) 212(H) 162(H)  AST 15 - 41 U/L 55(H) 52(H) 39  ALT 0 - 44 U/L 74(H) 52(H) 43   08/18/2019 CT Abdomen Pelvis Wo Contrast (Accession [id number])   08/18/2019 CT Chest Wo Contrast (Accession [id number])   RADIOGRAPHIC STUDIES: I have personally reviewed the radiological images as listed and agreed with the findings in the report. No results found.   ASSESSMENT & PLAN:   70 y.o. Caucasian male with  #1 Recurrent metastatic Renal cell carcinoma with multiple lung metastases.  Has been on stable on 1st line Sutent for >41yr. Has had SBRT to the dominant right lung nodule with partial response. No lab or clinical evidence of metastatic RCC progression at this time.  PET/CT 08/08/2016 with a couple of mildly enlarge LLL pulmonary nodules - no other overt evidence of RCC progression at this time.  PET CT scan 11/06/2016 - no overt evidence of RCC progression at this time .  PET/CT 03/13/2017 - no overt evidence of RCC progression at this time .  PET/CT 08/10/2017 - no overt evidence of RCC progression at this time .  PET/ CT done 08/10/2017, shows no evidence of  Progression at this time.  12/07/17 PET which revealed No findings suspicious for recurrent or metastatic disease.  03/22/18 PET/CT revealed One of the left lower lobe nodules has very minimally enlarged over the last 2 years, currently 0.6 by 0.4 cm and  measuring 0.4 by 0.3 cm on 03/20/2016. This probably represents a previously treated metastatic nodule given that it measured 1.0 by 0.8 cm on 06/06/2015. This nodule may be subject to some slice selection phenomenon and also motion artifact given proximity to the diaphragm. Surveillance is likely warranted. 2. Chronic central lucency, marginal bony deposition, and slow healing of the right lateral fourth rib fracture. Possibilities may include underlying radiation necrosis as a cause for fracture leading to slow healing, or a low-grade pathologic lesion. Maximum SUV at this fracture site is 2.9, previously 2.4. 3. 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 There is only low-grade activity in this vicinity characteristic of radiation fibrosis, without focal activity. 4. Other imaging findings of potential clinical significance: Aortic Atherosclerosis. Coronary atherosclerosis. Mild cardiomegaly. Right nephrectomy. 8 mm nonobstructive calculus in the left kidney lower pole.   08/24/18 CT C/A/P revealed "Stable small left lower lobe pulmonary nodules from recent prior studies. As previously noted, these have decreased in size from PET-CT 06/06/2015, likely treated metastases. No new or enlarging pulmonary nodules. 2. Postsurgical changes in the upper abdomen without evidence of local recurrence  or metastatic disease. 3. Nonobstructing left renal calculus. 4. Stable radiation changes in the right upper lobe with probable chronic pathologic fracture of the right 4th rib laterally."  03/07/2019 CT C/A wo contrast revealed "1. Stable left lower lobe pulmonary nodules, likely treated Metastases. 2. No additional evidence of metastatic disease. 3. Left renal stone. 4. Post radiation scarring in the right hemithorax with a healed adjacent right rib fracture which is presumably pathologic in Etiology. 5.  Aortic atherosclerosis (ICD10-170.0). 6.  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 Confirmed by [place].  #4 mild thrombocytopenia likely related to Sutent- resolved with dose reduction. #5 Abnormal Liver function test - significant bump in transaminases. -resolved on labs today #6 grade 1 fatigue due to Sutent -better with dose reduction and changing schedule to 2 week on and 1 week. #7 Dysguesia from Sutent - manageable per patient. He is trying to eat as well as he can and is maintaining his body weight.  #8 chronic kidney disease/single kidney -following with Dr Candiss Norse for mx with Nephrotic syndrome #10 rheumatoid arthritis -follows with Dr. Estanislado Pandy for mx #11 DM2 -continue f/u with Dr Philip Aspen for continue mx, blood sugar at home per patient.    PLAN: -Discussed pt labwork today, 12/12/2020; blood counts stable, blood sugars elevated, albumin still low, liver enzymes elevated, kidney numbers improved. LDH stable. -Advised pt his elevated liver numbers could be due to an element of fatty liver or one of his medications. -Advised pt that his pain may not be very typical and could be referred pain due to his hx of stomach surgeries, spleen removal, etc. The pt will need a repeat scan most likely. -Will get CT Abd in 1 week. Can cancel this if GI orders a different scan. -No lab or clinical evidence of renal cell carcinoma recurrence at this time. Will continue watchful observation. -Continue to hold the 37.5 mg Sutent at this time-- in the context of NED status from [place] and significant issues with anasarca and nephrotic syndrome. -Will see back in 2 weeks via phone to review imaging results.   FOLLOW UP: CT abd/pelvis without contrast in 1 week Phone visit with Dr Irene Limbo in 2 weeks    The total time spent in the appointment was 30 minutes and more than 50% was on counseling and direct patient cares.  All of  the patient's questions were answered with apparent satisfaction. The patient knows to call the clinic with any problems, questions or concerns.   Sullivan Lone MD [place] AAHIVMS [facility] Hematology/Oncology Physician [facility]  (Office):       [phone] (Work cell):  [phone] (Fax):           [phone]  I, Reinaldo Raddle, am acting as scribe for Dr. Sullivan Lone, MD.   .I have reviewed the above documentation for accuracy and completeness, and I agree with the above. Brunetta Genera MD

## 2020-12-12 ENCOUNTER — Inpatient Hospital Stay: Payer: Medicare Other | Admitting: Hematology

## 2020-12-12 ENCOUNTER — Other Ambulatory Visit: Payer: Self-pay

## 2020-12-12 ENCOUNTER — Other Ambulatory Visit: Payer: Self-pay | Admitting: *Deleted

## 2020-12-12 ENCOUNTER — Other Ambulatory Visit (HOSPITAL_COMMUNITY): Payer: Self-pay

## 2020-12-12 ENCOUNTER — Inpatient Hospital Stay: Payer: Medicare Other | Attending: Hematology

## 2020-12-12 VITALS — BP 148/76 | HR 54 | Temp 97.7°F | Resp 18 | Ht 69.75 in | Wt 177.5 lb

## 2020-12-12 DIAGNOSIS — Z905 Acquired absence of kidney: Secondary | ICD-10-CM | POA: Diagnosis not present

## 2020-12-12 DIAGNOSIS — C7800 Secondary malignant neoplasm of unspecified lung: Secondary | ICD-10-CM | POA: Insufficient documentation

## 2020-12-12 DIAGNOSIS — E119 Type 2 diabetes mellitus without complications: Secondary | ICD-10-CM | POA: Insufficient documentation

## 2020-12-12 DIAGNOSIS — Z7982 Long term (current) use of aspirin: Secondary | ICD-10-CM | POA: Insufficient documentation

## 2020-12-12 DIAGNOSIS — E039 Hypothyroidism, unspecified: Secondary | ICD-10-CM | POA: Diagnosis not present

## 2020-12-12 DIAGNOSIS — I10 Essential (primary) hypertension: Secondary | ICD-10-CM | POA: Insufficient documentation

## 2020-12-12 DIAGNOSIS — C649 Malignant neoplasm of unspecified kidney, except renal pelvis: Secondary | ICD-10-CM

## 2020-12-12 DIAGNOSIS — Z85850 Personal history of malignant neoplasm of thyroid: Secondary | ICD-10-CM | POA: Diagnosis not present

## 2020-12-12 DIAGNOSIS — C641 Malignant neoplasm of right kidney, except renal pelvis: Secondary | ICD-10-CM | POA: Insufficient documentation

## 2020-12-12 DIAGNOSIS — Z87891 Personal history of nicotine dependence: Secondary | ICD-10-CM | POA: Insufficient documentation

## 2020-12-12 DIAGNOSIS — M069 Rheumatoid arthritis, unspecified: Secondary | ICD-10-CM | POA: Diagnosis not present

## 2020-12-12 DIAGNOSIS — R1012 Left upper quadrant pain: Secondary | ICD-10-CM | POA: Diagnosis not present

## 2020-12-12 DIAGNOSIS — Z79899 Other long term (current) drug therapy: Secondary | ICD-10-CM | POA: Diagnosis not present

## 2020-12-12 DIAGNOSIS — C7900 Secondary malignant neoplasm of unspecified kidney and renal pelvis: Secondary | ICD-10-CM

## 2020-12-12 LAB — CBC WITH DIFFERENTIAL (CANCER CENTER ONLY)
Abs Immature Granulocytes: 0.03 10*3/uL (ref 0.00–0.07)
Basophils Absolute: 0.1 10*3/uL (ref 0.0–0.1)
Basophils Relative: 1 %
Eosinophils Absolute: 0.2 10*3/uL (ref 0.0–0.5)
Eosinophils Relative: 2 %
HCT: 40.7 % (ref 39.0–52.0)
Hemoglobin: 13.4 g/dL (ref 13.0–17.0)
Immature Granulocytes: 0 %
Lymphocytes Relative: 20 %
Lymphs Abs: 1.8 10*3/uL (ref 0.7–4.0)
MCH: 34.4 pg — ABNORMAL HIGH (ref 26.0–34.0)
MCHC: 32.9 g/dL (ref 30.0–36.0)
MCV: 104.4 fL — ABNORMAL HIGH (ref 80.0–100.0)
Monocytes Absolute: 1.1 10*3/uL — ABNORMAL HIGH (ref 0.1–1.0)
Monocytes Relative: 12 %
Neutro Abs: 5.8 10*3/uL (ref 1.7–7.7)
Neutrophils Relative %: 65 %
Platelet Count: 233 10*3/uL (ref 150–400)
RBC: 3.9 MIL/uL — ABNORMAL LOW (ref 4.22–5.81)
RDW: 12.9 % (ref 11.5–15.5)
WBC Count: 9 10*3/uL (ref 4.0–10.5)
nRBC: 0 % (ref 0.0–0.2)

## 2020-12-12 LAB — CMP (CANCER CENTER ONLY)
ALT: 74 U/L — ABNORMAL HIGH (ref 0–44)
AST: 55 U/L — ABNORMAL HIGH (ref 15–41)
Albumin: 2.2 g/dL — ABNORMAL LOW (ref 3.5–5.0)
Alkaline Phosphatase: 183 U/L — ABNORMAL HIGH (ref 38–126)
Anion gap: 8 (ref 5–15)
BUN: 29 mg/dL — ABNORMAL HIGH (ref 8–23)
CO2: 24 mmol/L (ref 22–32)
Calcium: 8.2 mg/dL — ABNORMAL LOW (ref 8.9–10.3)
Chloride: 109 mmol/L (ref 98–111)
Creatinine: 1.93 mg/dL — ABNORMAL HIGH (ref 0.61–1.24)
GFR, Estimated: 37 mL/min — ABNORMAL LOW (ref >60)
Glucose, Bld: 235 mg/dL — ABNORMAL HIGH (ref 70–99)
Potassium: 4 mmol/L (ref 3.5–5.1)
Sodium: 141 mmol/L (ref 135–145)
Total Bilirubin: 0.7 mg/dL (ref 0.3–1.2)
Total Protein: 4.7 g/dL — ABNORMAL LOW (ref 6.5–8.1)

## 2020-12-12 LAB — LACTATE DEHYDROGENASE: LDH: 214 U/L — ABNORMAL HIGH (ref 98–192)

## 2020-12-13 ENCOUNTER — Telehealth: Payer: Self-pay | Admitting: Gastroenterology

## 2020-12-13 ENCOUNTER — Ambulatory Visit: Payer: Medicare Other | Admitting: Gastroenterology

## 2020-12-13 ENCOUNTER — Other Ambulatory Visit (INDEPENDENT_AMBULATORY_CARE_PROVIDER_SITE_OTHER): Payer: Medicare Other

## 2020-12-13 ENCOUNTER — Other Ambulatory Visit: Payer: Medicare Other

## 2020-12-13 ENCOUNTER — Ambulatory Visit: Payer: Medicare Other | Admitting: Hematology

## 2020-12-13 ENCOUNTER — Other Ambulatory Visit: Payer: Self-pay

## 2020-12-13 ENCOUNTER — Ambulatory Visit: Payer: Medicare Other | Admitting: Physician Assistant

## 2020-12-13 ENCOUNTER — Telehealth: Payer: Self-pay

## 2020-12-13 ENCOUNTER — Encounter: Payer: Self-pay | Admitting: Gastroenterology

## 2020-12-13 VITALS — BP 138/70 | HR 52 | Ht 69.5 in | Wt 177.4 lb

## 2020-12-13 DIAGNOSIS — C649 Malignant neoplasm of unspecified kidney, except renal pelvis: Secondary | ICD-10-CM | POA: Diagnosis not present

## 2020-12-13 DIAGNOSIS — R1012 Left upper quadrant pain: Secondary | ICD-10-CM

## 2020-12-13 DIAGNOSIS — R748 Abnormal levels of other serum enzymes: Secondary | ICD-10-CM

## 2020-12-13 DIAGNOSIS — R933 Abnormal findings on diagnostic imaging of other parts of digestive tract: Secondary | ICD-10-CM

## 2020-12-13 DIAGNOSIS — Z90410 Acquired total absence of pancreas: Secondary | ICD-10-CM

## 2020-12-13 DIAGNOSIS — Z9049 Acquired absence of other specified parts of digestive tract: Secondary | ICD-10-CM

## 2020-12-13 DIAGNOSIS — R194 Change in bowel habit: Secondary | ICD-10-CM | POA: Diagnosis not present

## 2020-12-13 LAB — IBC + FERRITIN
Ferritin: >1500 ng/mL — ABNORMAL HIGH (ref 22.0–322.0)
Iron: 136 ug/dL (ref 42–165)
Saturation Ratios: 64.3 % — ABNORMAL HIGH (ref 20.0–50.0)
Transferrin: 151 mg/dL — ABNORMAL LOW (ref 212.0–360.0)

## 2020-12-13 MED ORDER — SUTAB 1479-225-188 MG PO TABS: 1.0000 | ORAL_TABLET | Freq: Once | ORAL | 0 refills | Status: AC

## 2020-12-13 MED ORDER — DICYCLOMINE HCL 10 MG PO CAPS: 10.0000 mg | ORAL_CAPSULE | Freq: Three times a day (TID) | ORAL | 1 refills | Status: AC | PRN

## 2020-12-13 NOTE — Telephone Encounter (Signed)
Pt's wife called to let Dr Irene Limbo know that pt is scheduled for endoscopy and colonoscopy on May 18 th. Will update Dr Irene Limbo.

## 2020-12-13 NOTE — Patient Instructions (Addendum)
If you are age 71 or older, your body mass index should be between 23-30. Your Body mass index is 25.82 kg/m. If this is out of the aforementioned range listed, please consider follow up with your Primary Care Provider.  If you are age 34 or younger, your body mass index should be between 19-25. Your Body mass index is 25.82 kg/m. If this is out of the aformentioned range listed, please consider follow up with your Primary Care Provider.    You have been scheduled for an endoscopy and colonoscopy. Please follow the written instructions given to you at your visit today. Please pick up your prep supplies at the pharmacy within the next 1-3 days. If you use inhalers (even only as needed), please bring them with you on the day of your procedure.  Please go to the lab in the basement of our building to have lab work done as you leave today. Hit "B" for basement when you get on the elevator.  When the doors open the lab is on your left.  We will call you with the results. Thank you.  Due to recent changes in healthcare laws, you may see the results of your imaging and laboratory studies on MyChart before your provider has had a chance to review them.  We understand that in some cases there may be results that are confusing or concerning to you. Not all laboratory results come back in the same time frame and the provider may be waiting for multiple results in order to interpret others.  Please give [place] 48 hours in order for your provider to thoroughly review all the results before contacting the office for clarification of your results.    Please ask your nephrologist about taking Prilosec.   Thank you for entrusting me with your care and for choosing [facility], Dr. [place] Cellar    .

## 2020-12-13 NOTE — Progress Notes (Signed)
sutab with Medicare coupon codes sent to pharmacy

## 2020-12-13 NOTE — Progress Notes (Signed)
HPI :  70 year old male with a history of metastatic renal cell carcinoma, history of Whipple / gastric resection, diabetes, referred here by Bevelyn Buckles MD for abdominal pain, abnormal CT scan.   The patient is accompanied by his wife today.  He has an extensive medical history to include renal cell carcinoma diagnosed over 20 years ago.  He had a right nephrectomy initially, years later had recurrence of disease in his abdomen including his pancreas, spleen and small intestine.  He had a resection of his pancreas, Whipple procedure, with part of his stomach and intestine/spleen removed.  Also cholecystectomy.  He is also had recurrence in his thyroid that led to thyroidectomy.  He has been on chemotherapy with Dr. Irene Limbo, namely Sunitinib for the past 5 to 6 years.  He has been off this drug for the past 3 months however due to elevated liver enzymes.  He states he has developed discomfort in his left upper quadrant, feels it underneath his left rib.  He has been in pain in this area for the past few weeks, seems to be there all the time.  He will wake up in the morning when the pain is the worst.  He tends to have multiple bowel movements in the morning he states having a bowel movement can make this better.  He will have roughly 3-4 bowel movements in the morning, some that are loose in nature, and then another bowel movement later in the day.  The pain is not as bad later in the day but it never goes away.  He denies any blood in his stool.  He thinks also eating can make the pain worse at times.  He has been using famotidine as needed which she states does help.  He denies any NSAID use.  No nausea or vomiting but he does have some belching that is unusual for him.  No weight loss.  He is compliant with his pancreatic enzymes since he has had his Whipple completed.  He has not been on any PPIs in the past.  He has 1 kidney that has been monitored closely by nephrology. He denies any trauma to the  area.   The patient has had elevation in his liver enzymes since November or so.  Alkaline phosphatase has ranged from 130s to 200s, ALT 50s to 70s, AST 40s to 50s.  Total bilirubin normal.  He has an occasional beer but no routine alcohol use.  It was thought that the sunitinib may be related to his liver enzyme elevation and it was stopped few months ago.  Unfortunately his liver enzymes yesterday continue to show persistent elevation.  He had a CT scan of his chest abdomen pelvis this past March.  There appears to be stable disease without any progression of his malignancy.  He does have some right colonic wall thickening on that CT scan which has been seen previously. His last colonoscopy was in 2015 which was normal.  He denies any cardiopulmonary symptoms at this time.    Echocardiogram 03/20/20 - EF 60-65%, moderate LVH,   Colonoscopy 04/12/2014 - Dr. Deatra Ina -  good prep, normal exam     Past Medical History:  Diagnosis Date  . Arthritis   . Cancer [facility])    Renal cell  . DDD (degenerative disc disease), cervical 05/28/2016  . DDD (degenerative disc disease), lumbar 05/28/2016  . Depression   . Diabetes ([place])   . Hyperlipidemia 05/28/2016  . Hypertension   . Hypothyroidism   .  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 Kidney disease   . Macular degeneration    Bilateral eyes  . Nephrolithiasis   . Osteoarthritis of both hands 05/28/2016  . Osteoarthritis of both knees 05/28/2016  . Peripheral vascular disorder ([place]) 05/28/2016  . 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 Rheumatoid arthritis ([place])   . Thyroid cancer ([place]) 05/28/2016     Past Surgical History:  Procedure Laterality Date  . CATARACT EXTRACTION Bilateral 2013  . CHOLECYSTECTOMY    . GASTRECTOMY     tumor removed  . LIPOMA EXCISION Right 1999  . NEPHRECTOMY Right   . PANCREATECTOMY    . SPLENECTOMY    . THYROIDECTOMY    . URETERAL STENT PLACEMENT Left 2012   . WHIPPLE PROCEDURE     Family History  Problem Relation Age of Onset  . Bleeding Disorder Mother        Unknown what type  . Osteoporosis Mother   . Heart disease Mother   . Diabetes Mother   . Hypertension Mother   . Lymphoma Father        Hodgkin's lymphoma  . Other Daughter        Brain stent  . Colon cancer Neg Hx    Social History   Tobacco Use  . Smoking status: Former Smoker    Years: 1.50  . Smokeless tobacco: Never Used  Vaping Use  . Vaping Use: Never used  Substance Use Topics  . Alcohol use: Yes    Comment: occasional beer  . Drug use: No   Current Outpatient Medications  Medication Sig Dispense Refill  . amLODipine (NORVASC) 10 MG tablet take 1 tablet by mouth once daily 90 tablet 3  . aspirin 81 MG tablet Take 81 mg by mouth daily.     Marland Kitchen atenolol (TENORMIN) 100 MG tablet Take 100 mg by mouth daily.     Marland Kitchen augmented betamethasone dipropionate (DIPROLENE-AF) 0.05 % cream Apply 1 application topically daily as needed (rash).   0  . azelastine (OPTIVAR) 0.05 % ophthalmic solution Apply 1 drop to eye in the morning and at bedtime.    . calcium citrate-vitamin D (CITRACAL+D) 315-200 MG-UNIT per tablet Take 2 tablets by mouth 2 (two) times daily.     . Cholecalciferol (VITAMIN D-3 PO) Take 1,000 Units by mouth daily.    . Cyanocobalamin (VITAMIN B-12) 2500 MCG SUBL Take 2,500 mcg by mouth daily.     . diclofenac Sodium (VOLTAREN) 1 % GEL Apply 3 g topically 3 (three) times daily as needed (joint pain).     Marland Kitchen dicyclomine (BENTYL) 10 MG capsule Take 1 capsule (10 mg total) by mouth every 8 (eight) hours as needed for spasms. 30 capsule 1  . docusate sodium (COLACE) 100 MG capsule Take 100 mg by mouth 2 (two) times daily as needed for mild constipation.     . dorzolamide-timolol (COSOPT) 22.3-6.8 MG/ML ophthalmic solution Place 1 drop into both eyes 2 (two) times daily.   1  . famotidine (PEPCID) 20 MG tablet Take 20 mg by mouth 3 (three) times daily.    . ferrous  sulfate 325 (65 FE) MG tablet Take 325 mg by mouth 2 (two) times daily with a meal.     . gabapentin (NEURONTIN) 300 MG capsule Take 300 mg by mouth at bedtime.    . Glucosamine-MSM-Hyaluronic Acd 750-375-30 MG TABS Take 2 tablets by mouth daily.    Marland Kitchen HYDROcodone-acetaminophen (NORCO/VICODIN) 5-325 MG tablet  Take 1 tablet by mouth every 6 (six) hours as needed.    . insulin lispro (HUMALOG) 100 UNIT/ML injection Inject 2-4 Units into the skin 3 (three) times daily before meals. Per sliding scale    . Iron-Vitamins (GERITOL COMPLETE) TABS Take 1 tablet by mouth daily.    . Lactobacillus (ACIDOPHILUS) CAPS capsule Take 1 capsule by mouth daily.    Marland Kitchen latanoprost (XALATAN) 0.005 % ophthalmic solution Place 1 drop into both eyes at bedtime.   1  . LEVEMIR 100 UNIT/ML injection Inject 4 Units into the skin 2 (two) times daily.   1  . levothyroxine (SYNTHROID) 200 MCG tablet Take 200 mcg by mouth every morning.    Marland Kitchen losartan (COZAAR) 25 MG tablet Take 25 mg by mouth daily.    Marland Kitchen lovastatin (MEVACOR) 40 MG tablet Take 40 mg by mouth at bedtime.     . Multiple Vitamins-Minerals (PRESERVISION AREDS 2+MULTI VIT PO) Take 1 tablet by mouth in the morning and at bedtime.    . Omega-3 Fatty Acids (FISH OIL) 1200 MG CAPS Take 1 capsule by mouth daily.    . Pancrelipase, Lip-Prot-Amyl, 6000-19000 units CPEP Take 4 capsules by mouth 3 (three) times daily.    Marland Kitchen torsemide (DEMADEX) 20 MG tablet Take 20 mg by mouth daily.    . traMADol (ULTRAM) 50 MG tablet Take 50 mg by mouth 2 (two) times daily as needed.    . SUNItinib (SUTENT) 37.5 MG capsule TAKE 1 CAPSULE BY MOUTH ONCE DAILY FOR 2 WEEKS ON, 1 WEEK OFF (Patient not taking: No sig reported) 28 capsule 11   No current facility-administered medications for this visit.   Allergies  Allergen Reactions  . Iodine      Review of Systems: All systems reviewed and negative except where noted in HPI.   Lab Results  Component Value Date   WBC 9.0 12/12/2020    HGB 13.4 12/12/2020   HCT 40.7 12/12/2020   MCV 104.4 (H) 12/12/2020   PLT 233 12/12/2020    Lab Results  Component Value Date   CREATININE 1.93 (H) 12/12/2020   BUN 29 (H) 12/12/2020   NA 141 12/12/2020   K 4.0 12/12/2020   CL 109 12/12/2020   CO2 24 12/12/2020    Lab Results  Component Value Date   ALT 74 (H) 12/12/2020   AST 55 (H) 12/12/2020   ALKPHOS 183 (H) 12/12/2020   BILITOT 0.7 12/12/2020     Physical Exam: BP 138/70 (BP Location: Left Arm, Patient Position: Sitting, Cuff Size: Normal)   Pulse (!) 52   Ht 5' 9.5" (1.765 m)   Wt 177 lb 6 oz (80.5 kg)   BMI 25.82 kg/m  Constitutional: Pleasant,well-developed, male in no acute distress. HEENT: Normocephalic and atraumatic. Conjunctivae are normal. No scleral icterus. Neck supple.  Cardiovascular: Normal rate, regular rhythm.  Pulmonary/chest: Effort normal and breath sounds normal. No wheezing, rales or rhonchi. Abdominal: Soft, nondistended, some LUQ tenderness. Midline scar. There are no masses palpable. No hepatomegaly. Extremities: no edema Lymphadenopathy: No cervical adenopathy noted. Neurological: Alert and oriented to person place and time. Skin: Skin is warm and dry. No rashes noted. Psychiatric: Normal mood and affect. Behavior is normal.   ASSESSMENT AND PLAN: 70 year old male here for new patient assessment the following:  Left upper quadrant abdominal pain Altered bowel habits Renal cell carcinoma Abnormal CT scan GI tract History of Whipple Elevated liver enzymes  70 year old male with history as outlined above.  Persistent left upper  abdominal pain over the past few weeks.  There is some association with both his bowel habits and eating.  Pepcid has provided some benefit but symptoms have persisted.  We discussed differential diagnosis.  He has a history of metastatic renal cell cancer and multiple surgeries, his anatomy is altered.  He has a CT scan scheduled with oncology in the near  future and I agree with having that done to reassess the status of his malignancy.  Given his prandial association of symptoms, I offered him an upper endoscopy to clear his upper tract to make sure no PUD etc.  Likewise in light of the thickening of his colon noted on CT scan and changes in bowel habits with this I also offered him a colonoscopy.  I discussed anesthesia and risk benefits of both, he wishes to proceed.  He will continue his Pepcid as needed.  I think short-term use of trial of omeprazole is reasonable, but would like him to ask his nephrologist if they are okay with that.  I would hold off on empiric Carafate right now given his CKD.  Hopefully we can clarify source of his symptoms with CT scan, EGD, and colonoscopy.  I will give him some empiric Bentyl in the interim to see if that may help him.  Otherwise we discussed his elevated liver enzymes, sunitinib can certainly cause this however it has been held and enzyme elevation persists.  We will await the CT scan to look at his liver and I will send serologies to rule out other causes of liver disease.  If his liver enzymes remain persistently elevated as soon and then remains held and no clear etiology otherwise, we may need to consider liver biopsy.  We briefly discussed this possibility, he and his wife are agreeable if needed.  Plan: - agree with CT A/P per Oncology - EGD and colonoscopy to be scheduled for next week - 18th - continue pepcid, he will ask nephrology if okay with trial of prilosec, or can await results of EGD first - labs for LFTs, await CT, may need liver biopsy pending his course. Sunitinib is being held - trial of bentyl 10mg  q 8 hours PRN  Further recommendations pending results of his exams and his course   Cellar, MD [facility]  CC: [name], MD

## 2020-12-13 NOTE — Telephone Encounter (Signed)
Patients wife called to advise that Dr. Merita Norton has approved the temp usage of Prilosec and they need to know what the dosage would be and orders. Please advise.

## 2020-12-14 ENCOUNTER — Telehealth: Payer: Self-pay | Admitting: Gastroenterology

## 2020-12-14 NOTE — Telephone Encounter (Signed)
Called and spoke to patient hand his wife. All questions have been asked. Medications have been updated

## 2020-12-14 NOTE — Telephone Encounter (Signed)
Pls call pt's wife Dylan Jefferson. She has some questions about instructions for procedure. She is also requesting to correct some information on the pt's medication list as Dr. Candiss Norse recently made some changes:  Lozartan has been increased to 50 mg  Pt no longer taking Amlodipine 10mg    Gabapentin is  Prn [place] dose is: 7 units in the morning and 5 units at night.

## 2020-12-14 NOTE — Telephone Encounter (Signed)
Thanks. It would be prilosec 20mg  OTC once daily. He can get this OTC or we can prescribe for him, whichever his preference. Thanks

## 2020-12-17 ENCOUNTER — Other Ambulatory Visit: Payer: Self-pay

## 2020-12-17 LAB — HEPATITIS B SURFACE ANTIBODY,QUALITATIVE: Hep B S Ab: NONREACTIVE

## 2020-12-17 LAB — ANA: Anti Nuclear Antibody (ANA): POSITIVE — AB

## 2020-12-17 LAB — HEPATITIS C ANTIBODY
Hepatitis C Ab: NONREACTIVE
SIGNAL TO CUT-OFF: 0.01 (ref <1.00)

## 2020-12-17 LAB — ANTI-NUCLEAR AB-TITER (ANA TITER): ANA Titer 1: 1:40 {titer} — ABNORMAL HIGH

## 2020-12-17 LAB — IGG: IgG (Immunoglobin G), Serum: 501 mg/dL — ABNORMAL LOW (ref 600–1540)

## 2020-12-17 LAB — HEPATITIS A ANTIBODY, TOTAL: Hepatitis A AB,Total: NONREACTIVE

## 2020-12-17 LAB — MITOCHONDRIAL ANTIBODIES: Mitochondrial M2 Ab, IgG: <=20 U

## 2020-12-17 LAB — HEPATITIS B SURFACE ANTIGEN: Hepatitis B Surface Ag: NONREACTIVE

## 2020-12-17 LAB — ANTI-SMOOTH MUSCLE ANTIBODY, IGG: Actin (Smooth Muscle) Antibody (IGG): <20 U (ref <20)

## 2020-12-17 MED ORDER — OMEPRAZOLE 20 MG PO CPDR
20.0000 mg | DELAYED_RELEASE_CAPSULE | Freq: Every day | ORAL | 3 refills | Status: DC
Start: 2020-12-17 — End: 2021-01-01

## 2020-12-17 NOTE — Progress Notes (Signed)
Error

## 2020-12-17 NOTE — Telephone Encounter (Signed)
tpatient Medication Detail   Disp Refills Start End   omeprazole (PRILOSEC) 20 MG capsule 90 capsule 3 12/17/2020    Sig - Route: Take 1 capsule (20 mg total) by mouth daily. May purchase OTC if cost effective - Oral   Sent to pharmacy as: omeprazole (PRILOSEC) 20 MG capsule   E-Prescribing Status: Receipt confirmed by pharmacy (12/17/2020 10:56 AM EDT)

## 2020-12-18 ENCOUNTER — Other Ambulatory Visit: Payer: Self-pay

## 2020-12-18 DIAGNOSIS — Z23 Encounter for immunization: Secondary | ICD-10-CM

## 2020-12-19 ENCOUNTER — Other Ambulatory Visit: Payer: Medicare Other

## 2020-12-19 ENCOUNTER — Ambulatory Visit (AMBULATORY_SURGERY_CENTER): Payer: Medicare Other | Admitting: Gastroenterology

## 2020-12-19 ENCOUNTER — Encounter: Payer: Self-pay | Admitting: Gastroenterology

## 2020-12-19 ENCOUNTER — Other Ambulatory Visit: Payer: Self-pay

## 2020-12-19 VITALS — BP 139/85 | HR 45 | Temp 96.9°F | Resp 13 | Ht 69.0 in | Wt 177.0 lb

## 2020-12-19 DIAGNOSIS — K2950 Unspecified chronic gastritis without bleeding: Secondary | ICD-10-CM | POA: Diagnosis not present

## 2020-12-19 DIAGNOSIS — K648 Other hemorrhoids: Secondary | ICD-10-CM

## 2020-12-19 DIAGNOSIS — K2970 Gastritis, unspecified, without bleeding: Secondary | ICD-10-CM

## 2020-12-19 DIAGNOSIS — R935 Abnormal findings on diagnostic imaging of other abdominal regions, including retroperitoneum: Secondary | ICD-10-CM | POA: Diagnosis not present

## 2020-12-19 DIAGNOSIS — R1012 Left upper quadrant pain: Secondary | ICD-10-CM | POA: Diagnosis not present

## 2020-12-19 DIAGNOSIS — D123 Benign neoplasm of transverse colon: Secondary | ICD-10-CM

## 2020-12-19 DIAGNOSIS — D125 Benign neoplasm of sigmoid colon: Secondary | ICD-10-CM

## 2020-12-19 DIAGNOSIS — D124 Benign neoplasm of descending colon: Secondary | ICD-10-CM

## 2020-12-19 DIAGNOSIS — K639 Disease of intestine, unspecified: Secondary | ICD-10-CM | POA: Diagnosis not present

## 2020-12-19 DIAGNOSIS — R194 Change in bowel habit: Secondary | ICD-10-CM | POA: Diagnosis not present

## 2020-12-19 DIAGNOSIS — Z23 Encounter for immunization: Secondary | ICD-10-CM

## 2020-12-19 MED ORDER — SODIUM CHLORIDE 0.9 % IV SOLN: 500.0000 mL | Freq: Once | INTRAVENOUS | Status: DC

## 2020-12-19 MED ORDER — OMEPRAZOLE 20 MG PO CPDR: 20.0000 mg | DELAYED_RELEASE_CAPSULE | Freq: Every day | ORAL | 3 refills | Status: DC

## 2020-12-19 NOTE — Progress Notes (Signed)
To pacu, VSS. Report to [email or web address] 

## 2020-12-19 NOTE — Op Note (Signed)
[place] Patient Name: Dylan Jefferson Procedure Date: 12/19/2020 9:01 AM MRN: [id number] Endoscopist: Remo Lipps P. Havery Moros , MD Age: 70 Referring MD:  Date of Birth: 1950-11-06 Gender: Male Account #: [id number] Procedure:                Upper GI endoscopy Indications:              Abdominal pain in the left upper quadrant, history                            of gastrectomy / Whipple procedure for metastatic                            renal cell carcinoma Medicines:                Monitored Anesthesia Care Procedure:                Pre-Anesthesia Assessment:                           - Prior to the procedure, a History and Physical                            was performed, and patient medications and                            allergies were reviewed. The patient's tolerance of                            previous anesthesia was also reviewed. The risks                            and benefits of the procedure and the sedation                            options and risks were discussed with the patient.                            All questions were answered, and informed consent                            was obtained. Prior Anticoagulants: The patient has                            taken no previous anticoagulant or antiplatelet                            agents. ASA Grade Assessment: III - A patient with                            severe systemic disease. After reviewing the risks                            and benefits, the patient was deemed in  satisfactory condition to undergo the procedure.                           After obtaining informed consent, the endoscope was                            passed under direct vision. Throughout the                            procedure, the patient's blood pressure, pulse, and                            oxygen saturations were monitored continuously. The                            Endoscope was introduced  through the mouth, and                            advanced to the jejunum. The upper GI endoscopy was                            accomplished without difficulty. The patient                            tolerated the procedure well. Scope In: Scope Out: Findings:                 Esophagogastric landmarks were identified: the                            Z-line was found at 35 cm, the gastroesophageal                            junction was found at 35 cm and the upper extent of                            the gastric folds was found at 35 cm from the                            incisors.                           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 They were small in size without any                            high risk stigmata for bleeding.  The exam of the esophagus was otherwise normal.                           Evidence of a gastroenterostomy was found in the                            gastric body. Anastomosis was characterized by                            healthy appearing mucosa.                           Diffuse erythematous mucosa was found in the                            gastric pouch. Biopsies were taken with a cold                            forceps for Helicobacter pylori testing.                           The exam of the stomach remnant was otherwise                            normal. Gastric remnant is small.                           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 I could not traverse it with the upper                            endoscope but could see through it and it appeared                            to be normal appearing bowel.                           Exam of the small bowel was otherwise normal, it                            was deeply  intubated. Complications:            No immediate complications. Estimated blood loss:                            Minimal. Estimated Blood Loss:     Estimated blood loss was minimal. Impression:               - Esophagogastric landmarks identified.                           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                           - A gastroenterostomy was found, characterized by                            healthy appearing mucosa.                           - Erythematous mucosa in the gastric remnant.                            Biopsied to rule out H pylori.                           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                           - Otherwise normal small bowel.                           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                            Awaiting CT scan with oral contrast next week. May                            need small bowel series as well pending findings. Recommendation:           - Patient has a contact number available for                            emergencies. The signs and symptoms of potential                            delayed complications were discussed with the                            patient. Return to normal activities tomorrow.                            Written discharge instructions were provided to the                            patient.                           - Resume previous diet.                           -  Continue present medications.                           - Start trial of omeprazole 20mg  / day as                            previously recommended (cleared per nephrology) for                            mild gastritis                           - Await pathology results.                           -  Await CT scan with further recommendations Remo Lipps P. Adelai Achey, MD 12/19/2020 10:02:04 AM This report has been signed electronically.

## 2020-12-19 NOTE — Progress Notes (Signed)
[place] vital signs. 

## 2020-12-19 NOTE — Patient Instructions (Signed)
Please read handouts provided. Continue present medications. Await pathology results. Start trial of omeprazole 20mg /day. Await pathology results.     YOU HAD AN ENDOSCOPIC PROCEDURE TODAY AT [facility]:   Refer to the procedure report that was given to you for any specific questions about what was found during the examination.  If the procedure report does not answer your questions, please call your gastroenterologist to clarify.  If you requested that your care partner not be given the details of your procedure findings, then the procedure report has been included in a sealed envelope for you to review at your convenience later.  YOU SHOULD EXPECT: Some feelings of bloating in the abdomen. Passage of more gas than usual.  Walking can help get rid of the air that was put into your GI tract during the procedure and reduce the bloating. If you had a lower endoscopy (such as a colonoscopy or flexible sigmoidoscopy) you may notice spotting of blood in your stool or on the toilet paper. If you underwent a bowel prep for your procedure, you may not have a normal bowel movement for a few days.  Please Note:  You might notice some irritation and congestion in your nose or some drainage.  This is from the oxygen used during your procedure.  There is no need for concern and it should clear up in a day or so.  SYMPTOMS TO REPORT IMMEDIATELY:   Following lower endoscopy (colonoscopy or flexible sigmoidoscopy):  Excessive amounts of blood in the stool  Significant tenderness or worsening of abdominal pains  Swelling of the abdomen that is new, acute  Fever of 100F or higher   Following upper endoscopy (EGD)  Vomiting of blood or coffee ground material  New chest pain or pain under the shoulder blades  Painful or persistently difficult swallowing  New shortness of breath  Fever of 100F or higher  Black, tarry-looking stools  For urgent or emergent issues, a gastroenterologist  can be reached at any hour by calling [phone]. Do not use MyChart messaging for urgent concerns.    DIET:  We do recommend a small meal at first, but then you may proceed to your regular diet.  Drink plenty of fluids but you should avoid alcoholic beverages for 24 hours.  ACTIVITY:  You should plan to take it easy for the rest of today and you should NOT DRIVE or use heavy machinery until tomorrow (because of the sedation medicines used during the test).    FOLLOW UP: Our staff will call the number listed on your records 48-72 hours following your procedure to check on you and address any questions or concerns that you may have regarding the information given to you following your procedure. If we do not reach you, we will leave a message.  We will attempt to reach you two times.  During this call, we will ask if you have developed any symptoms of COVID 19. If you develop any symptoms (ie: fever, flu-like symptoms, shortness of breath, cough etc.) before then, please call [phone].  If you test positive for Covid 19 in the 2 weeks post procedure, please call and report this information to [place].    If any biopsies were taken you will be contacted by phone or by letter within the next 1-3 weeks.  Please call us at [phone] if you have not heard about the biopsies in 3 weeks.    SIGNATURES/CONFIDENTIALITY: You and/or your care partner have signed paperwork  which will be entered into your electronic medical record.  These signatures attest to the fact that that the information above on your After Visit Summary has been reviewed and is understood.  Full responsibility of the confidentiality of this discharge information lies with you and/or your care-partner.

## 2020-12-19 NOTE — Op Note (Signed)
[facility] Patient Name: Dylan Jefferson Procedure Date: 12/19/2020 9:00 AM MRN: [id number] Endoscopist: Remo Lipps P. Havery Moros , MD Age: 70 Referring MD:  Date of Birth: 1950/11/10 Gender: Male Account #: [id number] Procedure:                Colonoscopy Indications:              unexplained abdominal pain in the left upper                            quadrant, Abnormal CT of the GI tract with RIGHT                            colon thickening, Change in bowel habits, history                            of metastatic renal cell carcinoma Medicines:                Monitored Anesthesia Care Procedure:                Pre-Anesthesia Assessment:                           - Prior to the procedure, a History and Physical                            was performed, and patient medications and                            allergies were reviewed. The patient's tolerance of                            previous anesthesia was also reviewed. The risks                            and benefits of the procedure and the sedation                            options and risks were discussed with the patient.                            All questions were answered, and informed consent                            was obtained. Prior Anticoagulants: The patient has                            taken no previous anticoagulant or antiplatelet                            agents. ASA Grade Assessment: III - A patient with                            severe systemic disease. After reviewing the risks  and benefits, the patient was deemed in                            satisfactory condition to undergo the procedure.                           After obtaining informed consent, the colonoscope                            was passed under direct vision. Throughout the                            procedure, the patient's blood pressure, pulse, and                            oxygen saturations were  monitored continuously. The                            Olympus PFC-H190DL [id number]) Colonoscope was                            introduced through the anus and advanced to the the                            terminal ileum, with identification of the                            appendiceal orifice and IC valve. The colonoscopy                            was performed without difficulty. The patient                            tolerated the procedure well. The quality of the                            bowel preparation was adequate. The terminal ileum,                            ileocecal valve, appendiceal orifice, and rectum                            were photographed. Scope In: 9:21:44 AM Scope Out: 9:45:40 AM Scope Withdrawal Time: 0 hours 16 minutes 48 seconds  Total Procedure Duration: 0 hours 23 minutes 56 seconds  Findings:                 The perianal and digital rectal examinations were                            normal.                           The terminal ileum appeared normal.  The mucosa of the cecum was slightly altered with                            textural change. Clear borders not identified. I                            suspect normal variant but biopsies were taken with                            a cold forceps for histology to ensure no flat                            adenoma.                           Two sessile polyps were found in the transverse                            colon. The polyps were 3 to 5 mm in size. These                            polyps were removed with a cold snare. Resection                            and retrieval were complete.                           A 4 mm polyp was found in the descending colon. The                            polyp was sessile. The polyp was removed with a                            cold snare. Resection and retrieval were complete.                           Four sessile polyps were found in the  sigmoid                            colon. The polyps were 4 to 5 mm in size. These                            polyps were removed with a cold snare. Resection                            and retrieval were complete.                           Internal hemorrhoids were found during retroflexion.                           The colon was spastic and tortous. The exam was  otherwise without abnormality. Complications:            No immediate complications. Estimated blood loss:                            Minimal. Estimated Blood Loss:     Estimated blood loss was minimal. Impression:               - The examined portion of the ileum was normal.                           - Altered mucosa in the cecum, suspect normal                            variant but biopsied to ensure no flat adenoma.                           - Two 3 to 5 mm polyps in the transverse colon,                            removed with a cold snare. Resected and retrieved.                           - One 4 mm polyp in the descending colon, removed                            with a cold snare. Resected and retrieved.                           - Four 4 to 5 mm polyps in the sigmoid colon,                            removed with a cold snare. Resected and retrieved.                           - Internal hemorrhoids.                           - The examination was otherwise normal.                           No clear cause for abdominal pain on this exam. Can                            continue bentyl PRN if that helps, otherwise await                            pending CT scan Recommendation:           - Patient has a contact number available for                            emergencies. The signs and symptoms of potential  delayed complications were discussed with the                            patient. Return to normal activities tomorrow.                            Written discharge  instructions were provided to the                            patient.                           - Resume previous diet.                           - Continue present medications.                           - Await pathology results. Remo Lipps P. Taksh Hjort, MD 12/19/2020 9:53:10 AM This report has been signed electronically.

## 2020-12-19 NOTE — Progress Notes (Signed)
Pt's states no medical or surgical changes since previsit or office visit. 

## 2020-12-19 NOTE — Progress Notes (Signed)
Called to room to assist during endoscopic procedure.  Patient ID and intended procedure confirmed with present staff. Received instructions for my participation in the procedure from the performing physician.  

## 2020-12-21 ENCOUNTER — Telehealth: Payer: Self-pay | Admitting: *Deleted

## 2020-12-21 NOTE — Telephone Encounter (Signed)
  Follow up Call-  Call back number 12/19/2020  Post procedure Call Back phone  # [phone]  Permission to leave phone message Yes  Some recent data might be hidden     Patient questions:  Do you have a fever, pain , or abdominal swelling? No. Pain Score  0 *  Have you tolerated food without any problems? Yes.    Have you been able to return to your normal activities? Yes.    Do you have any questions about your discharge instructions: Diet   No. Medications  No. Follow up visit  No.  Do you have questions or concerns about your Care? No.  Actions: * If pain score is 4 or above: 1. No action needed, pain <4.Have you developed a fever since your procedure? no  2.   Have you had an respiratory symptoms (SOB or cough) since your procedure? no  3.   Have you tested positive for COVID 19 since your procedure no  4.   Have you had any family members/close contacts diagnosed with the COVID 19 since your procedure?  no   If yes to any of these questions please route to Joylene John, RN and Joella Prince, RN

## 2020-12-24 ENCOUNTER — Ambulatory Visit (HOSPITAL_COMMUNITY)
Admission: RE | Admit: 2020-12-24 | Discharge: 2020-12-24 | Disposition: A | Discharge status: Home / Self Care | Payer: Medicare Other | Source: Ambulatory Visit | Attending: Hematology | Admitting: Hematology

## 2020-12-24 ENCOUNTER — Other Ambulatory Visit: Payer: Self-pay

## 2020-12-24 ENCOUNTER — Ambulatory Visit (INDEPENDENT_AMBULATORY_CARE_PROVIDER_SITE_OTHER): Payer: Medicare Other | Admitting: Gastroenterology

## 2020-12-24 DIAGNOSIS — R1012 Left upper quadrant pain: Secondary | ICD-10-CM | POA: Insufficient documentation

## 2020-12-24 DIAGNOSIS — Z23 Encounter for immunization: Secondary | ICD-10-CM

## 2020-12-24 DIAGNOSIS — C649 Malignant neoplasm of unspecified kidney, except renal pelvis: Secondary | ICD-10-CM | POA: Insufficient documentation

## 2020-12-25 ENCOUNTER — Telehealth: Payer: Self-pay | Admitting: Gastroenterology

## 2020-12-25 NOTE — Progress Notes (Signed)
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 2) Remote history of metastatic renal cell carcinoma Diagnosed with renal cell carcinoma 20 years ago and had a right nephrectomy. About 8 years after that he was noted to have abdominal recurrence in his pancreas spleen and small intestine and had a Whipple's procedure and significant abdominal surgery and notes that 10 out of 17 lymph nodes were positive. He also had his gallbladder removed. Postoperative course was complicated by an internal hemorrhage as per his report. Patient notes 2-3 years after that he had recurrence in his thyroid that led to a thyroidectomy. [June 2004] later he had partial gastrectomy for local recurrence. [February 2005] when he presented with GI bleeding. Patient notes that he has had no known evidence of recurrence over the last 8 years until his recent CT scan showed lung nodules.  Current Treatment: Sutent 37.5 mg po daily for 2 weeks on and 1 weeks off. (on hold now from 10/2020) Previous treatment Sutent on cycle 1 - 50 mg by mouth daily for 4 weeks on and 2-weeks off. It was dose adjusted to 37.5 mg by mouth daily for 2 weeks with 1 week of to help mitigate issues with fatigue, mild hyperbilirubinemia, cytopenias. SBRT to dominant RML nodule.   INTERVAL HISTORY: I connected with Delphina Cahill on 12/26/2020 by telephone and verified that I am speaking with the correct person using two identifiers.   I discussed the limitations of evaluation and management by telemedicine. The patient expressed understanding and agreed to proceed.   Other persons  participating in the visit and their role in the encounter:                                                         - Reinaldo Raddle, Medical Scribe     Patient's location: Home Provider's location: [place] at [employer] is here for for his scheduled follow-up of his metastatic renal cell carcinoma.The patient's last visit with [place] was on 12/12/2020. The pt reports that he is doing well overall.   The pt reports that he recently had an endoscopy and colonoscopy that found some inflammation. He is now on Prilosec. He also received a hepatis B and C vaccination recently.  He does note some decreased appetite due to the procedures recently, but his appetite is slowly returning to his baseline. He is no longer taking Ferrous Sulfate and Geritol Complete.  Of note since the patient's last visit, pt has had CT A/P ([id number]) on 12/24/2020, which revealed "1. Stable left lower lobe nodules. No evidence of new or progressive metastatic disease. 2. Left nephrolithiasis. 3. Distal gastrectomy with similar proximal gastric wall thickening. 4.  Aortic atherosclerosis (ICD10-I70.0)."  On review of systems, pt denies any acute symptoms.  Past Medical History:  Diagnosis Date  . Arthritis   . Cancer [facility])    Renal cell  . DDD (degenerative disc disease), cervical 05/28/2016  . DDD (degenerative disc disease), lumbar 05/28/2016  . Depression   .  Diabetes ([place])   . Hyperlipidemia 05/28/2016  . Hypertension   . Hypothyroidism   . 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 Kidney disease   . Macular degeneration    Bilateral eyes  . Nephrolithiasis   . Osteoarthritis of both hands 05/28/2016  . Osteoarthritis of both knees 05/28/2016  . Peripheral vascular disorder ([place]) 05/28/2016  . 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 Rheumatoid arthritis ([place])   . Thyroid cancer ([place]) 05/28/2016    . Past Surgical History:  Procedure Laterality  Date  . CATARACT EXTRACTION Bilateral 2013  . CHOLECYSTECTOMY    . GASTRECTOMY     tumor removed  . LIPOMA EXCISION Right 1999  . NEPHRECTOMY Right   . PANCREATECTOMY    . SPLENECTOMY    . THYROIDECTOMY    . URETERAL STENT PLACEMENT Left 2012  . WHIPPLE PROCEDURE      . Social History   Tobacco Use  . Smoking status: Former Smoker    Years: 1.50  . Smokeless tobacco: Never Used  Vaping Use  . Vaping Use: Never used  Substance Use Topics  . Alcohol use: Yes    Comment: occasional beer  . Drug use: No    ALLERGIES:  is allergic to iodine.  MEDICATIONS:  Current Outpatient Medications  Medication Sig Dispense Refill  . aspirin 81 MG tablet Take 81 mg by mouth daily.     Marland Kitchen atenolol (TENORMIN) 100 MG tablet Take 100 mg by mouth daily.     Marland Kitchen augmented betamethasone dipropionate (DIPROLENE-AF) 0.05 % cream Apply 1 application topically daily as needed (rash).   0  . azelastine (OPTIVAR) 0.05 % ophthalmic solution Apply 1 drop to eye in the morning and at bedtime.    . calcium citrate-vitamin D (CITRACAL+D) 315-200 MG-UNIT per tablet Take 2 tablets by mouth 2 (two) times daily.     . Cholecalciferol (VITAMIN D-3 PO) Take 1,000 Units by mouth daily.    . Cyanocobalamin (VITAMIN B-12) 2500 MCG SUBL Take 2,500 mcg by mouth daily.     . diclofenac Sodium (VOLTAREN) 1 % GEL Apply 3 g topically 3 (three) times daily as needed (joint pain).     Marland Kitchen dicyclomine (BENTYL) 10 MG capsule Take 1 capsule (10 mg total) by mouth every 8 (eight) hours as needed for spasms. 30 capsule 1  . docusate sodium (COLACE) 100 MG capsule Take 100 mg by mouth 2 (two) times daily as needed for mild constipation.     . dorzolamide-timolol (COSOPT) 22.3-6.8 MG/ML ophthalmic solution Place 1 drop into both eyes 2 (two) times daily.   1  . famotidine (PEPCID) 20 MG tablet Take 20 mg by mouth 3 (three) times daily.    . ferrous sulfate 325 (65 FE) MG tablet Take 325 mg by mouth 2 (two) times daily with a meal.      . gabapentin (NEURONTIN) 300 MG capsule Take 300 mg by mouth as needed.    . Glucosamine-MSM-Hyaluronic Acd 750-375-30 MG TABS Take 2 tablets by mouth daily.    Marland Kitchen HYDROcodone-acetaminophen (NORCO/VICODIN) 5-325 MG tablet Take 1 tablet by mouth every 6 (six) hours as needed.    . insulin lispro (HUMALOG) 100 UNIT/ML injection Inject 2-4 Units into the skin 3 (three) times daily before meals. Per sliding scale    . Iron-Vitamins (GERITOL COMPLETE) TABS Take 1 tablet by mouth daily.    . Lactobacillus (ACIDOPHILUS) CAPS capsule Take 1 capsule by mouth daily.    Marland Kitchen  latanoprost (XALATAN) 0.005 % ophthalmic solution Place 1 drop into both eyes at bedtime.   1  . LEVEMIR 100 UNIT/ML injection Inject 7 Units into the skin 2 (two) times daily.  1  . levothyroxine (SYNTHROID) 200 MCG tablet Take 200 mcg by mouth every morning.    Marland Kitchen losartan (COZAAR) 25 MG tablet Take 50 mg by mouth daily.    Marland Kitchen lovastatin (MEVACOR) 40 MG tablet Take 40 mg by mouth at bedtime.     . Multiple Vitamins-Minerals (PRESERVISION AREDS 2+MULTI VIT PO) Take 1 tablet by mouth in the morning and at bedtime.    . Omega-3 Fatty Acids (FISH OIL) 1200 MG CAPS Take 1 capsule by mouth daily.    Marland Kitchen omeprazole (PRILOSEC) 20 MG capsule Take 1 capsule (20 mg total) by mouth daily. May purchase OTC if cost effective 90 capsule 3  . omeprazole (PRILOSEC) 20 MG capsule Take 1 capsule (20 mg total) by mouth daily. 30 capsule 3  . Pancrelipase, Lip-Prot-Amyl, 6000-19000 units CPEP Take 4 capsules by mouth 3 (three) times daily.    . SUNItinib (SUTENT) 37.5 MG capsule TAKE 1 CAPSULE BY MOUTH ONCE DAILY FOR 2 WEEKS ON, 1 WEEK OFF (Patient not taking: No sig reported) 28 capsule 11  . torsemide (DEMADEX) 20 MG tablet Take 20 mg by mouth daily.    . traMADol (ULTRAM) 50 MG tablet Take 50 mg by mouth 2 (two) times daily as needed.     No current facility-administered medications for this visit.    REVIEW OF SYSTEMS:   10 Point review of Systems was  done is negative except as noted above.  PHYSICAL EXAMINATION: ECOG FS:1 - Symptomatic but completely ambulatory  There were no vitals filed for this visit. Wt Readings from Last 3 Encounters:  12/19/20 177 lb (80.3 kg)  12/13/20 177 lb 6 oz (80.5 kg)  12/12/20 177 lb 8 oz (80.5 kg)   There is no height or weight on file to calculate BMI.      Telehealth Visit.  LABORATORY DATA:   I have reviewed the data as listed  . CBC Latest Ref Rng & Units 12/12/2020 09/18/2020 07/24/2020  WBC 4.0 - 10.5 K/uL 9.0 5.2 5.7  Hemoglobin 13.0 - 17.0 g/dL 13.4 13.5 14.8  Hematocrit 39.0 - 52.0 % 40.7 39.2 43.7  Platelets 150 - 400 K/uL 233 169 161  ANC 1.4k . CBC    Component Value Date/Time   WBC 9.0 12/12/2020 0844   WBC 5.2 09/18/2020 1112   RBC 3.90 (L) 12/12/2020 0844   HGB 13.4 12/12/2020 0844   HGB 14.0 06/11/2017 0808   HCT 40.7 12/12/2020 0844   HCT 40.7 06/11/2017 0808   PLT 233 12/12/2020 0844   PLT 223 06/11/2017 0808   MCV 104.4 (H) 12/12/2020 0844   MCV 112.1 (H) 06/11/2017 0808   MCH 34.4 (H) 12/12/2020 0844   MCHC 32.9 12/12/2020 0844   RDW 12.9 12/12/2020 0844   RDW 13.9 06/11/2017 0808   LYMPHSABS 1.8 12/12/2020 0844   LYMPHSABS 1.6 06/11/2017 0808   MONOABS 1.1 (H) 12/12/2020 0844   MONOABS 0.5 06/11/2017 0808   EOSABS 0.2 12/12/2020 0844   EOSABS 0.1 06/11/2017 0808   BASOSABS 0.1 12/12/2020 0844   BASOSABS 0.0 06/11/2017 0808   . CMP Latest Ref Rng & Units 12/12/2020 09/18/2020 07/24/2020  Glucose 70 - 99 mg/dL 235(H) 218(H) 85  BUN 8 - 23 mg/dL 29(H) 19 19  Creatinine 0.61 - 1.24 mg/dL 1.93(H) 2.54(H) 1.95(H)  Sodium 135 - 145 mmol/L 141 141 145  Potassium 3.5 - 5.1 mmol/L 4.0 4.0 3.6  Chloride 98 - 111 mmol/L 109 111 113(H)  CO2 22 - 32 mmol/L 24 26 25   Calcium 8.9 - 10.3 mg/dL 8.2(L) 7.5(L) 8.6(L)  Total Protein 6.5 - 8.1 g/dL 4.7(L) 4.5(L) 6.0(L)  Total Bilirubin 0.3 - 1.2 mg/dL 0.7 0.6 0.7  Alkaline Phos 38 - 126 U/L 183(H) 212(H) 162(H)  AST 15  - 41 U/L 55(H) 52(H) 39  ALT 0 - 44 U/L 74(H) 52(H) 43   08/18/2019 CT Abdomen Pelvis Wo Contrast (Accession [id number])   08/18/2019 CT Chest Wo Contrast (Accession [id number])   RADIOGRAPHIC STUDIES: I have personally reviewed the radiological images as listed and agreed with the findings in the report. CT Abdomen Pelvis Wo Contrast  Result Date: 12/24/2020 CLINICAL DATA:  Left upper quadrant pain, metastatic renal cell carcinoma. EXAM: CT ABDOMEN AND PELVIS WITHOUT CONTRAST TECHNIQUE: Multidetector CT imaging of the abdomen and pelvis was performed following the standard protocol without IV contrast. COMPARISON:  10/03/2020. FINDINGS: Lower chest: Patchy consolidation and volume loss in the anterior right lower lobe, similar to 10/03/2020 and possibly treatment related. Left lower lobe nodules measure up to 5 mm (4/54), unchanged. Heart is at the upper limits of normal in size. No pericardial or pleural effusion. Distal esophagus is grossly unremarkable. Hepatobiliary: Pneumobilia. Liver is otherwise unremarkable. Cholecystectomy. No biliary ductal dilatation. Pancreas: Surgically absent. Spleen: Surgically absent. Adrenals/Urinary Tract: Adrenal glands are unremarkable. Right nephrectomy. Subcentimeter low-attenuation lesions in the left kidney are too small to characterize. Left renal stone. Left ureter is decompressed. Slight ventral bladder wall thickening. Stomach/Bowel: Proximal gastric wall thickening, distal gastrectomy. Small bowel and colon are otherwise unremarkable. Appendix is not visualized. Vascular/Lymphatic: Atherosclerotic calcification of the aorta. No pathologically enlarged lymph nodes. Reproductive: Prostate is visualized. Other: No free fluid.  Mesenteries and peritoneum are unremarkable. Musculoskeletal: No worrisome lytic or sclerotic lesions. IMPRESSION: 1. Stable left lower lobe nodules. No evidence of new or progressive metastatic disease. 2. Left nephrolithiasis. 3.  Distal gastrectomy with similar proximal gastric wall thickening. 4.  Aortic atherosclerosis (ICD10-I70.0). Electronically Signed   By: Lorin Picket M.D.   On: 12/24/2020 15:17     ASSESSMENT & PLAN:   70 y.o. Caucasian male with  #1 Recurrent metastatic Renal cell carcinoma with multiple lung metastases.  Has been on stable on 1st line Sutent for >3yr. Has had SBRT to the dominant right lung nodule with partial response. No lab or clinical evidence of metastatic RCC progression at this time.  PET/CT 08/08/2016 with a couple of mildly enlarge LLL pulmonary nodules - no other overt evidence of RCC progression at this time.  PET CT scan 11/06/2016 - no overt evidence of RCC progression at this time .  PET/CT 03/13/2017 - no overt evidence of RCC progression at this time .  PET/CT 08/10/2017 - no overt evidence of RCC progression at this time .  PET/ CT done 08/10/2017, shows no evidence of  Progression at this time.  12/07/17 PET which revealed No findings suspicious for recurrent or metastatic disease.  03/22/18 PET/CT revealed One of the left lower lobe nodules has very minimally enlarged over the last 2 years, currently 0.6 by 0.4 cm and measuring 0.4 by 0.3 cm on 03/20/2016. This probably represents a previously treated metastatic nodule given that it measured 1.0 by 0.8 cm on 06/06/2015. This nodule may be subject to some slice selection phenomenon and also motion artifact given proximity  to the diaphragm. Surveillance is likely warranted. 2. Chronic central lucency, marginal bony deposition, and slow healing of the right lateral fourth rib fracture. Possibilities may include underlying radiation necrosis as a cause for fracture leading to slow healing, or a low-grade pathologic lesion. Maximum SUV at this fracture site is 2.9, previously 2.4. 3. 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 There is only low-grade activity  in this vicinity characteristic of radiation fibrosis, without focal activity. 4. Other imaging findings of potential clinical significance: Aortic Atherosclerosis. Coronary atherosclerosis. Mild cardiomegaly. Right nephrectomy. 8 mm nonobstructive calculus in the left kidney lower pole.   08/24/18 CT C/A/P revealed "Stable small left lower lobe pulmonary nodules from recent prior studies. As previously noted, these have decreased in size from PET-CT 06/06/2015, likely treated metastases. No new or enlarging pulmonary nodules. 2. Postsurgical changes in the upper abdomen without evidence of local recurrence or metastatic disease. 3. Nonobstructing left renal calculus. 4. Stable radiation changes in the right upper lobe with probable chronic pathologic fracture of the right 4th rib laterally."  03/07/2019 CT C/A wo contrast revealed "1. Stable left lower lobe pulmonary nodules, likely treated Metastases. 2. No additional evidence of metastatic disease. 3. Left renal stone. 4. Post radiation scarring in the right hemithorax with a healed adjacent right rib fracture which is presumably pathologic in Etiology. 5.  Aortic atherosclerosis (ICD10-170.0). 6. 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 Confirmed by [place].  #4 mild thrombocytopenia likely related to Sutent- resolved with dose reduction. #5 Abnormal Liver function test - significant bump in transaminases. -resolved on labs today #6 grade 1 fatigue due to Sutent -better with dose reduction and changing schedule to 2 week on and 1 week. #7 Dysguesia from Sutent - manageable per patient. He is trying to eat as well as he can and is maintaining his body weight.  #8 chronic kidney disease/single kidney -following with Dr Candiss Norse for mx with Nephrotic syndrome #10 rheumatoid arthritis -follows with Dr. Estanislado Pandy for mx #11 DM2 -continue  f/u with Dr Philip Aspen for continue mx, blood sugar at home per patient.    PLAN: -Discussed pt CT A/P ([id number]) on 12/24/2020; no evidence of new metastases.  -Advised pt that his pain sounds more muscular and not due to the kidney stones. -Advised pt the biopsy of endoscopy showed some inflammation in the stomach and this could be causing some pain. -Advised pt that liver inflammation and elevation in transaminases can cause some element of elevated ferritin. It is unlikely to be due to a true iron excess. Would hold po iron replacement at this time. GI has sent HFE mutation evaluation. -No lab or clinical evidence of renal cell carcinoma recurrence at this time. Will continue watchful observation. -Continue to hold the 37.5 mg Sutent at this time-- in the context of NED status from [place] and significant issues with anasarca and nephrotic syndrome. -Will see back in 3 month with labs.   FOLLOW UP: RTC with Dr Irene Limbo with labs in 3 months    The total time spent in the appointment was 20 minutes and more than 50% was on counseling and direct patient cares.  All of the patient's questions were answered with apparent satisfaction. The patient knows to call the clinic with any problems, questions or concerns.   Sullivan Lone MD [place] AAHIVMS [facility]  [facility] Hematology/Oncology Physician [place]  (Office):       [phone] (Work cell):  [phone] (Fax):           [phone]  I, Reinaldo Raddle, am acting as scribe for Dr. Sullivan Lone, MD. .I have reviewed the above documentation for accuracy and completeness, and I agree with the above. Brunetta Genera MD

## 2020-12-25 NOTE — Telephone Encounter (Signed)
Inbound call from patient's wife requesting a call from a nurse please.  States patient may e having a reaction after his Hep injections he got yesterday; has no energy and a headache but no fever.  Please advise.

## 2020-12-25 NOTE — Telephone Encounter (Signed)
Spoke with patient, advised that these are normal side effects of the vaccine. Patient denies any fever. Advised that this is his body's natural response to the vaccine. Advised that he needs to stay hydrated and can take Tylenol. Advised patient to let us know if symptoms do not improve by the end of the week or if he develops a fever. Patient verbalized understanding and had no concerns at the end of the call.

## 2020-12-26 ENCOUNTER — Inpatient Hospital Stay (HOSPITAL_BASED_OUTPATIENT_CLINIC_OR_DEPARTMENT_OTHER): Payer: Medicare Other | Admitting: Hematology

## 2020-12-26 ENCOUNTER — Telehealth: Payer: Self-pay | Admitting: Hematology

## 2020-12-26 ENCOUNTER — Other Ambulatory Visit: Payer: Self-pay

## 2020-12-26 DIAGNOSIS — C649 Malignant neoplasm of unspecified kidney, except renal pelvis: Secondary | ICD-10-CM

## 2020-12-26 DIAGNOSIS — C641 Malignant neoplasm of right kidney, except renal pelvis: Secondary | ICD-10-CM | POA: Diagnosis not present

## 2020-12-26 LAB — HEMOCHROMATOSIS DNA-PCR(C282Y,H63D)

## 2020-12-26 NOTE — Telephone Encounter (Signed)
Scheduled follow-up appointment per 5/25 los. Patient is aware.

## 2020-12-27 ENCOUNTER — Other Ambulatory Visit: Payer: Self-pay

## 2020-12-27 ENCOUNTER — Telehealth: Payer: Self-pay | Admitting: Gastroenterology

## 2020-12-27 DIAGNOSIS — R933 Abnormal findings on diagnostic imaging of other parts of digestive tract: Secondary | ICD-10-CM

## 2020-12-27 NOTE — Addendum Note (Signed)
Addended by: Yevette Edwards on: 12/27/2020 12:29 PM   Modules accepted: Orders

## 2020-12-27 NOTE — Telephone Encounter (Signed)
Spoke with patient and his wife in regards to pathology results - see result note for more information.  Records have been faxed to Dr. Keturah Barre office.

## 2020-12-27 NOTE — Telephone Encounter (Signed)
Inbound call from patient wife, Dylan Jefferson. States Dr. Gean Quint at [facility] would like the latest procedures and notes that may be helpful faxed to his office at [phone]. He is being treated for kidney disease. Phone number for Dr. Is [phone]

## 2020-12-28 ENCOUNTER — Other Ambulatory Visit: Payer: Self-pay

## 2020-12-28 DIAGNOSIS — R933 Abnormal findings on diagnostic imaging of other parts of digestive tract: Secondary | ICD-10-CM

## 2020-12-28 DIAGNOSIS — R748 Abnormal levels of other serum enzymes: Secondary | ICD-10-CM

## 2021-01-01 ENCOUNTER — Telehealth: Payer: Self-pay | Admitting: Gastroenterology

## 2021-01-01 NOTE — Telephone Encounter (Signed)
Inbound call from patient wife, MaryJane. Calling in to confirm how long he is to take omeprazole 20mg . Best contact number [phone]

## 2021-01-01 NOTE — Telephone Encounter (Signed)
Called and spoke to patient and his wife. Indicated Dr. Havery Moros would like for him to continue to take omeprazole 20 mg once daily for now. He wants to make sure Dr. Havery Moros knows that he heard that is not good for his kidneys as he has kidney disease. Dr. Havery Moros, please advise. They also had questions about when they are supposed to arrive at [facility] for the Small Bowel Follow through on 6-13.   I have left a message for Vivien Rota at Radiology to clarify how long before test patient should arrive (15 mins? And NPO after midnight?)  Appointment note says arrive at 9:15am.

## 2021-01-02 NOTE — Telephone Encounter (Signed)
Thanks Jan. We had approval to use this from his nephrologist for a short course, 30 days. Will see if it helps him. If he finds no benefit after that trial he can stop it. If he finds it helpful will have to weigh risks / benefits of if he wants to continue this or not. Thanks

## 2021-01-02 NOTE — Telephone Encounter (Signed)
Called and spoke to patient re: omeprazole. He still has under-rib pain that has not improved but the belching and the upset stomach have improved on PPI.  He will let us know how he is doing after 30 days.   Confirmed that Radiology had mistakenly told him to arrive at 9:15am (confirmed via [place]) whereas he only needs to arrive 15 mins prior to Small bowel follow through procedure.  We discussed that since he is diabetic and his procedure is not until 11am it would be ok for him to have a light snack prior to 6am. He indicated he will come for blood work prior to the procedure. He understands to be well hydrated

## 2021-01-08 ENCOUNTER — Telehealth: Payer: Self-pay

## 2021-01-08 NOTE — Telephone Encounter (Signed)
Spoke with patient to remind him that he is due for lab work , pt states that he would like to complete lab work on 6/13 before he has his small bowel follow through. Patient states that his gas has improved on the Omeprazole. Pt states that the rib pain that he is having is where he had surgery. Pt states that he thinks the pain may be coming from scar tissue. Pt states that his surgeon, Dr. Delana Meyer from [place] retired. Patient would like to see what the small bowel follow through shows before reaching out to [facility]. Patient had no concerns at the end of the call.

## 2021-01-08 NOTE — Telephone Encounter (Signed)
-----   Message from Yevette Edwards, RN sent at 12/18/2020 11:05 AM EDT ----- Regarding: Labs Hepatic function panel, need to enter order.

## 2021-01-14 ENCOUNTER — Other Ambulatory Visit: Payer: Medicare Other

## 2021-01-14 ENCOUNTER — Ambulatory Visit (HOSPITAL_COMMUNITY)
Admission: RE | Admit: 2021-01-14 | Discharge: 2021-01-14 | Disposition: A | Discharge status: Home / Self Care | Payer: Medicare Other | Source: Ambulatory Visit | Attending: Gastroenterology | Admitting: Gastroenterology

## 2021-01-14 ENCOUNTER — Other Ambulatory Visit: Payer: Self-pay

## 2021-01-14 ENCOUNTER — Telehealth: Payer: Self-pay

## 2021-01-14 DIAGNOSIS — R933 Abnormal findings on diagnostic imaging of other parts of digestive tract: Secondary | ICD-10-CM | POA: Diagnosis not present

## 2021-01-14 DIAGNOSIS — R748 Abnormal levels of other serum enzymes: Secondary | ICD-10-CM

## 2021-01-14 LAB — HEPATIC FUNCTION PANEL
ALT: 51 U/L (ref 0–53)
AST: 44 U/L — ABNORMAL HIGH (ref 0–37)
Albumin: 2.6 g/dL — ABNORMAL LOW (ref 3.5–5.2)
Alkaline Phosphatase: 186 U/L — ABNORMAL HIGH (ref 39–117)
Bilirubin, Direct: 0.1 mg/dL (ref 0.0–0.3)
Total Bilirubin: 0.7 mg/dL (ref 0.2–1.2)
Total Protein: 5.1 g/dL — ABNORMAL LOW (ref 6.0–8.3)

## 2021-01-14 NOTE — Telephone Encounter (Signed)
Patient came in for labs today. 

## 2021-01-14 NOTE — Telephone Encounter (Signed)
-----   Message from Yevette Edwards, RN sent at 01/08/2021 10:23 AM EDT ----- Regarding: Labs Repeat hepatic function panel on 6/13, order in epic

## 2021-01-24 ENCOUNTER — Ambulatory Visit (INDEPENDENT_AMBULATORY_CARE_PROVIDER_SITE_OTHER): Payer: Medicare Other | Admitting: Gastroenterology

## 2021-01-24 DIAGNOSIS — Z23 Encounter for immunization: Secondary | ICD-10-CM | POA: Diagnosis not present

## 2021-01-25 ENCOUNTER — Telehealth: Payer: Self-pay | Admitting: *Deleted

## 2021-01-25 NOTE — Telephone Encounter (Signed)
Patient came for Twinrix injection #2 and wanted to inform Dr Havery Moros that since beginning omeprazole 20 mg, his abdominal pain has completely subsided.

## 2021-02-13 ENCOUNTER — Ambulatory Visit: Payer: Medicare Other | Admitting: Gastroenterology

## 2021-03-28 ENCOUNTER — Other Ambulatory Visit: Payer: Self-pay

## 2021-03-28 ENCOUNTER — Inpatient Hospital Stay: Payer: Medicare Other

## 2021-03-28 ENCOUNTER — Inpatient Hospital Stay: Payer: Medicare Other | Attending: Hematology | Admitting: Hematology

## 2021-03-28 VITALS — BP 171/71 | HR 50 | Temp 97.8°F | Resp 20 | Wt 193.9 lb

## 2021-03-28 DIAGNOSIS — E118 Type 2 diabetes mellitus with unspecified complications: Secondary | ICD-10-CM | POA: Diagnosis not present

## 2021-03-28 DIAGNOSIS — C7800 Secondary malignant neoplasm of unspecified lung: Secondary | ICD-10-CM | POA: Diagnosis present

## 2021-03-28 DIAGNOSIS — N189 Chronic kidney disease, unspecified: Secondary | ICD-10-CM | POA: Insufficient documentation

## 2021-03-28 DIAGNOSIS — Z79899 Other long term (current) drug therapy: Secondary | ICD-10-CM | POA: Diagnosis not present

## 2021-03-28 DIAGNOSIS — C649 Malignant neoplasm of unspecified kidney, except renal pelvis: Secondary | ICD-10-CM

## 2021-03-28 DIAGNOSIS — M069 Rheumatoid arthritis, unspecified: Secondary | ICD-10-CM | POA: Insufficient documentation

## 2021-03-28 DIAGNOSIS — Z794 Long term (current) use of insulin: Secondary | ICD-10-CM | POA: Insufficient documentation

## 2021-03-28 DIAGNOSIS — D696 Thrombocytopenia, unspecified: Secondary | ICD-10-CM | POA: Insufficient documentation

## 2021-03-28 LAB — CBC WITH DIFFERENTIAL (CANCER CENTER ONLY)
Abs Immature Granulocytes: 0.01 10*3/uL (ref 0.00–0.07)
Basophils Absolute: 0.1 10*3/uL (ref 0.0–0.1)
Basophils Relative: 1 %
Eosinophils Absolute: 0.2 10*3/uL (ref 0.0–0.5)
Eosinophils Relative: 2 %
HCT: 42.7 % (ref 39.0–52.0)
Hemoglobin: 14.2 g/dL (ref 13.0–17.0)
Immature Granulocytes: 0 %
Lymphocytes Relative: 28 %
Lymphs Abs: 2.1 10*3/uL (ref 0.7–4.0)
MCH: 35.1 pg — ABNORMAL HIGH (ref 26.0–34.0)
MCHC: 33.3 g/dL (ref 30.0–36.0)
MCV: 105.7 fL — ABNORMAL HIGH (ref 80.0–100.0)
Monocytes Absolute: 1.1 10*3/uL — ABNORMAL HIGH (ref 0.1–1.0)
Monocytes Relative: 14 %
Neutro Abs: 4.1 10*3/uL (ref 1.7–7.7)
Neutrophils Relative %: 55 %
Platelet Count: 209 10*3/uL (ref 150–400)
RBC: 4.04 MIL/uL — ABNORMAL LOW (ref 4.22–5.81)
RDW: 13.3 % (ref 11.5–15.5)
WBC Count: 7.4 10*3/uL (ref 4.0–10.5)
nRBC: 0 % (ref 0.0–0.2)

## 2021-03-28 LAB — CMP (CANCER CENTER ONLY)
ALT: 41 U/L (ref 0–44)
AST: 45 U/L — ABNORMAL HIGH (ref 15–41)
Albumin: 2.3 g/dL — ABNORMAL LOW (ref 3.5–5.0)
Alkaline Phosphatase: 181 U/L — ABNORMAL HIGH (ref 38–126)
Anion gap: 6 (ref 5–15)
BUN: 26 mg/dL — ABNORMAL HIGH (ref 8–23)
CO2: 27 mmol/L (ref 22–32)
Calcium: 8.2 mg/dL — ABNORMAL LOW (ref 8.9–10.3)
Chloride: 105 mmol/L (ref 98–111)
Creatinine: 2.32 mg/dL — ABNORMAL HIGH (ref 0.61–1.24)
GFR, Estimated: 29 mL/min — ABNORMAL LOW (ref >60)
Glucose, Bld: 244 mg/dL — ABNORMAL HIGH (ref 70–99)
Potassium: 4.8 mmol/L (ref 3.5–5.1)
Sodium: 138 mmol/L (ref 135–145)
Total Bilirubin: 0.8 mg/dL (ref 0.3–1.2)
Total Protein: 5.1 g/dL — ABNORMAL LOW (ref 6.5–8.1)

## 2021-03-28 LAB — LACTATE DEHYDROGENASE: LDH: 293 U/L — ABNORMAL HIGH (ref 98–192)

## 2021-04-01 ENCOUNTER — Telehealth: Payer: Self-pay | Admitting: Gastroenterology

## 2021-04-01 ENCOUNTER — Encounter: Payer: Self-pay | Admitting: Gastroenterology

## 2021-04-01 ENCOUNTER — Ambulatory Visit: Payer: Medicare Other | Admitting: Gastroenterology

## 2021-04-01 VITALS — BP 160/84 | HR 61 | Ht 69.0 in | Wt 191.0 lb

## 2021-04-01 DIAGNOSIS — R748 Abnormal levels of other serum enzymes: Secondary | ICD-10-CM

## 2021-04-01 DIAGNOSIS — C649 Malignant neoplasm of unspecified kidney, except renal pelvis: Secondary | ICD-10-CM

## 2021-04-01 DIAGNOSIS — I8500 Esophageal varices without bleeding: Secondary | ICD-10-CM | POA: Diagnosis not present

## 2021-04-01 DIAGNOSIS — L988 Other specified disorders of the skin and subcutaneous tissue: Secondary | ICD-10-CM

## 2021-04-01 DIAGNOSIS — R1010 Upper abdominal pain, unspecified: Secondary | ICD-10-CM

## 2021-04-01 NOTE — Telephone Encounter (Signed)
Spoke with patient, he has been advised that 07/08/21 will be too soon to get his 3rd Twinrix and insurance may not cover. Pt would like to move his follow up appt so that he can have the follow up and last Twinrix on the same day. His appt has been moved to Thursday, 07/25/21 at 8:30 am. Pt verbalized understanding and had no concerns at the end of the call.

## 2021-04-01 NOTE — Telephone Encounter (Signed)
Pt wants to know if it is fine to take his last Hep vaccine the day of his f/u appt on 07/08/21 since he had the past two doses on the 23rd of the month. Pls call him.

## 2021-04-01 NOTE — Telephone Encounter (Signed)
Pt returned call and was transferred to nurse vm.

## 2021-04-01 NOTE — Telephone Encounter (Signed)
Left message on vm for patient to return call. 

## 2021-04-01 NOTE — Progress Notes (Signed)
HPI :  70 year old male with a history of metastatic renal cell carcinoma, history of Whipple / gastric resection, diabetes, for abdominal pain, elevated LAEs.  See prior notes for full history. He has an extensive medical history to include renal cell carcinoma diagnosed over 20 years ago.  He had a right nephrectomy initially, years later had recurrence of disease in his abdomen including his pancreas, spleen and small intestine.  He had a resection of his pancreas, Whipple procedure, with part of his stomach and intestine/spleen removed.  Also cholecystectomy.  He is also had recurrence in his thyroid that led to thyroidectomy.  He has been on chemotherapy with Dr. Irene Limbo, namely Sunitinib for the past 5 to 6 years.  He had been off this drug for the past few months however due to elevated liver enzymes.  At our last visit he had a lot of discomfort in his left upper quadrant underneath his left rib that was really bothering him.  He had a work-up to include CT scan abdomen pelvis, EGD, and colonoscopy as outlined below.  CT scan in May showed stable changes of metastatic disease with some thickening in his stomach.  EGD incidentally showed some very small esophageal varices.  He had some postsurgical anatomy appreciated with some erythema in his stomach.  He also had a suspected fistula coming off the small bowel limb that cannot be intubated with the endoscope.  Colonoscopy showed a few polyps in his colon but there was no abnormality in the right colon that was previously seen on a CT scan (previously had right-sided colonic wall thickening).  I had placed him on a trial of PPI and he states within a week or 2 of starting that his symptoms of upper abdominal pain resolved.  He has not had any abdominal pain for few months now.  He is eating okay.  No nausea or vomiting.  His weight is stable, in fact he is increased on weight since of last seen him.  He is generally doing much better. The small bowel  fistula was not seen on prior CT scan.  He underwent an upper GI series which also did not show any evidence of a fistula.  Otherwise recall that the patient has had elevation in his liver enzymes since November 2021 or so.  Alkaline phosphatase has ranged from 130s to 200s, ALT 50s to 70s, AST 40s to 50s.  Total bilirubin normal.  He has an occasional beer but no routine alcohol use.  It was thought that the sunitinib may be related to his liver enzyme elevation and it was stopped few months ago.  Unfortunately his liver enzymes have continuee to show persistent elevation.  CT scan again showed a normal-appearing liver.  No obvious evidence of cirrhosis.  His spleen has been removed.  We did a serologic work-up to evaluate his liver enzymes.  Remarkable findings included an undetectably high ferritin and an iron saturation of 64%.  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  We had discussed the role of liver biopsy moving forward.  He has seen Dr. Linwood Dibbles recently.  They have plans on doing surveillance CT scan in November.  Patient is otherwise feeling well and wants to avoid liver biopsy if possible.   Prior work-up: Echocardiogram 03/20/20 - EF 60-65%, moderate LVH,    Colonoscopy 04/12/2014 - Dr. Deatra Ina -  good prep, normal exam     CT C/A/P - 12/24/20 - IMPRESSION: 1. Stable left lower lobe nodules.  No evidence of new or progressive metastatic disease. 2. Left nephrolithiasis. 3. Distal gastrectomy with similar proximal gastric wall thickening. 4.  Aortic atherosclerosis (ICD10-I70.0).   EGD 12/19/20 -  - Suspected small varices were found in the middle third of the esophagus and in the lower third of the esophagus. They were small in size without any high risk stigmata for bleeding. - The exam of the esophagus was otherwise normal. - Evidence of a gastroenterostomy was found in the gastric body. Anastomosis was characterized by healthy appearing mucosa. - Diffuse erythematous mucosa  was found in the gastric pouch. Biopsies were taken with a cold forceps for Helicobacter pylori testing. - The exam of the stomach remnant was otherwise normal. Gastric remnant is small. 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 I could not traverse it with the upper endoscope but could see through it and it appeared to be normal appearing bowel. - Exam of the small bowel was otherwise normal, it was deeply intubated.  Colonoscopy 12/19/20 - The perianal and digital rectal examinations were normal. - The terminal ileum appeared normal. - The mucosa of the cecum was slightly altered with textural change. Clear borders not identified. I suspect normal variant but biopsies were taken with a cold forceps for histology to ensure no flat adenoma. - Two sessile polyps were found in the transverse colon. The polyps were 3 to 5 mm in size. These polyps were removed with a cold snare. Resection and retrieval were complete. - A 4 mm polyp was found in the descending colon. The polyp was sessile. The polyp was removed with a cold snare. Resection and retrieval were complete. - Four sessile polyps were found in the sigmoid colon. The polyps were 4 to 5 mm in size. These polyps were removed with a cold snare. Resection and retrieval were complete. - Internal hemorrhoids were found during retroflexion. - The colon was spastic and tortous. The exam was otherwise without abnormality.   No clear cause for abdominal pain on this exam. Can continue bentyl PRN if that helps, otherwise await pending CT scan   1. Surgical [P], gastric pouch bxs - MILD CHRONIC GASTRITIS. - WARTHIN-STARRY STAIN IS NEGATIVE FOR HELICOBACTER PYLORI. 2. Surgical [P], colon, transverse x2, sigmoid x3, descending x1, polyp (6) - TUBULAR ADENOMA (X MULTIPLE). - NEGATIVE FOR HIGH GRADE DYSPLASIA. 3. Surgical [P], colon, cecum (atypical mucosa) biopsies - COLONIC MUCOSA WITH EDEMA. - NEGATIVE FOR DYSPLASIA.  UGI  series 01/14/21 - IMPRESSION: Gastric fold thickening, also present on the prior CT of 12/24/2020. Findings are nonspecific, but may reflect gastritis. Correlate with findings on recent biopsies.   The terminal ileum is somewhat poorly delineated due to overlapping small bowel loops (despite compression). Within this limitation, otherwise unremarkable upper GI series and small-bowel follow-through in this patient status post remote Whipple procedure. 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   Sero Negative, Ultrasound positive synovitis    Kidney disease    Macular degeneration    Bilateral eyes   Nephrolithiasis    Osteoarthritis of both hands 05/28/2016   Osteoarthritis of both knees 05/28/2016  Peripheral vascular disorder ([place]) 05/28/2016   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   Rheumatoid arthritis ([place])    Thyroid cancer ([place]) 05/28/2016     Past Surgical History:  Procedure Laterality Date   CATARACT EXTRACTION Bilateral 2013   CHOLECYSTECTOMY     GASTRECTOMY     tumor removed   LIPOMA EXCISION Right 1999   NEPHRECTOMY Right    PANCREATECTOMY     SPLENECTOMY     THYROIDECTOMY     URETERAL STENT PLACEMENT Left 2012   WHIPPLE PROCEDURE     Family History  Problem Relation Age of Onset   Bleeding Disorder Mother        Unknown what type   Osteoporosis Mother    Heart disease Mother    Diabetes Mother    Hypertension Mother    Lymphoma Father        Hodgkin's lymphoma   Other Daughter        Brain stent   Colon cancer Neg Hx    Social History   Tobacco Use   Smoking status: Former    Years: 1.50    Types: Cigarettes    Smokeless tobacco: Never  Vaping Use   Vaping Use: Never used  Substance Use Topics   Alcohol use: Yes    Comment: occasional beer   Drug use: No   Current Outpatient Medications  Medication Sig Dispense Refill   aspirin 81 MG tablet Take 81 mg by mouth daily.      atenolol (TENORMIN) 100 MG tablet Take 100 mg by mouth daily.      augmented betamethasone dipropionate (DIPROLENE-AF) 0.05 % cream Apply 1 application topically daily as needed (rash).   0   azelastine (OPTIVAR) 0.05 % ophthalmic solution Apply 1 drop to eye in the morning and at bedtime.     calcium citrate-vitamin D (CITRACAL+D) 315-200 MG-UNIT per tablet Take 2 tablets by mouth 2 (two) times daily.      Cholecalciferol (VITAMIN D-3 PO) Take 1,000 Units by mouth daily.     Cyanocobalamin (VITAMIN B-12) 2500 MCG SUBL Take 2,500 mcg by mouth daily.      diclofenac Sodium (VOLTAREN) 1 % GEL Apply 3 g topically 3 (three) times daily as needed (joint pain).      dicyclomine (BENTYL) 10 MG capsule Take 1 capsule (10 mg total) by mouth every 8 (eight) hours as needed for spasms. 30 capsule 1   docusate sodium (COLACE) 100 MG capsule Take 100 mg by mouth 2 (two) times daily as needed for mild constipation.      dorzolamide-timolol (COSOPT) 22.3-6.8 MG/ML ophthalmic solution Place 1 drop into both eyes 2 (two) times daily.   1   famotidine (PEPCID) 20 MG tablet Take 20 mg by mouth 3 (three) times daily.     gabapentin (NEURONTIN) 300 MG capsule Take 300 mg by mouth as needed.     Glucosamine-MSM-Hyaluronic Acd 750-375-30 MG TABS Take 2 tablets by mouth daily.     HYDROcodone-acetaminophen (NORCO/VICODIN) 5-325 MG tablet Take 1 tablet by mouth every 6 (six) hours as needed.     insulin lispro (HUMALOG) 100 UNIT/ML injection Inject 2-4 Units into the skin 3 (three) times daily before meals. Per sliding scale     Lactobacillus (ACIDOPHILUS) CAPS capsule Take 1 capsule by mouth daily.     latanoprost (XALATAN) 0.005 % ophthalmic solution  Place 1 drop into both eyes at bedtime.   1   LEVEMIR 100 UNIT/ML injection Inject 7 Units into the skin 2 (  two) times daily.  1   levothyroxine (SYNTHROID) 200 MCG tablet Take 250 mcg by mouth every morning.     losartan (COZAAR) 25 MG tablet Take 100 mg by mouth daily.     lovastatin (MEVACOR) 40 MG tablet Take 40 mg by mouth at bedtime.      Multiple Vitamins-Minerals (PRESERVISION AREDS 2 PO) Take by mouth in the morning and at bedtime.     Omega-3 Fatty Acids (FISH OIL) 1200 MG CAPS Take 1 capsule by mouth daily.     omeprazole (PRILOSEC) 20 MG capsule Take 1 capsule (20 mg total) by mouth daily. 30 capsule 3   Pancrelipase, Lip-Prot-Amyl, 6000-19000 units CPEP Take 4 capsules by mouth 3 (three) times daily.     torsemide (DEMADEX) 20 MG tablet Take 20 mg by mouth daily.     traMADol (ULTRAM) 50 MG tablet Take 50 mg by mouth 2 (two) times daily as needed.     No current facility-administered medications for this visit.   Allergies  Allergen Reactions   Iodine      Review of Systems: All systems reviewed and negative except where noted in HPI.   Lab Results  Component Value Date   WBC 7.4 03/28/2021   HGB 14.2 03/28/2021   HCT 42.7 03/28/2021   MCV 105.7 (H) 03/28/2021   PLT 209 03/28/2021    Lab Results  Component Value Date   ALT 41 03/28/2021   AST 45 (H) 03/28/2021   ALKPHOS 181 (H) 03/28/2021   BILITOT 0.8 03/28/2021    Lab Results  Component Value Date   CREATININE 2.32 (H) 03/28/2021   BUN 26 (H) 03/28/2021   NA 138 03/28/2021   K 4.8 03/28/2021   CL 105 03/28/2021   CO2 27 03/28/2021     Physical Exam: BP (!) 160/84   Pulse 61   Ht 5\' 9"  (1.753 m)   Wt 191 lb (86.6 kg)   SpO2 96%   BMI 28.21 kg/m  Constitutional: Pleasant,well-developed, male in no acute distress. Neurological: Alert and oriented to person place and time. Psychiatric: Normal mood and affect. Behavior is normal.   ASSESSMENT AND PLAN: 70 year old male here for reassessment  of following:  Upper abdominal pain Small bowel fistula Elevated liver enzymes Esophageal varices Renal cell carcinoma  As above, patient has undergone extensive evaluation.  Unclear what was causing his pain but completely resolved with PPI.  He did have some gastric erythema without ulceration on EGD.  Biopsies negative for H. pylori.  He also had a suspected small bowel fistula on EGD however this has not been seen on CT scan or a follow-up small bowel follow-through.  Given his abdominal pain has resolved we will continue to monitor for now.  We discussed ongoing PPI use, he will try to taper off over time and see how he does.  He does have a surveillance CT scan in November for his renal cell carcinoma and we will await that result.  Otherwise we discussed his liver enzymes which remain elevated although slightly improved.  Unclear what is causing this.  Iron studies were elevated, his hemochromatosis genetic testing would be less likely the cause liver enzyme elevation or cirrhosis.  He is noted to have some small varices on endoscopy and there is some chronicity to that finding based off prior endoscopic evaluation.  We discussed that is possible he had a medication or drug-induced liver injury, only way to tell would be to do a liver biopsy at this  point time to assess for active inflammation, try to clarify cause of liver injury, and assess for fibrotic change especially given small varices noted on endoscopy.  We discussed risk benefits of the liver biopsy.  He is really hoping to avoid this if possible.  He would prefer to continue to trend his liver enzymes and monitor if slowly improving although will be more agreeable if there is persistence over time or rise.  He wants to await his CT scan in November as well.  We have stopped his iron supplementation and other supplements and he is otherwise clinically feeling well.  I will see him again in December for a follow-up visit.  Of note he was not  immune to hepatitis A and B and vaccinate to those.  Plan: - continue omeprazole but will try to taper off over time - trend LAEs, holding off on liver biopsy for now per patient preference - await CT scan in November - stop iron supplementation - follow up in the office in December - call in the interim with recurrent pain or questions.  Jolly Mango, MD [facility]

## 2021-04-01 NOTE — Patient Instructions (Addendum)
If you are age 71 or older, your body mass index should be between 23-30. Your Body mass index is 28.21 kg/m. If this is out of the aforementioned range listed, please consider follow up with your Primary Care Provider.  If you are age 91 or younger, your body mass index should be between 19-25. Your Body mass index is 28.21 kg/m. If this is out of the aformentioned range listed, please consider follow up with your Primary Care Provider.   __________________________________________________________  The [facility] providers would like to encourage you to use [facility] to communicate with providers for non-urgent requests or questions.  Due to long hold times on the telephone, sending your provider a message by [facility] may be a faster and more efficient way to get a response.  Please allow 48 business hours for a response.  Please remember that this is for non-urgent requests.   Continue omeprazole.  Taper as tolerated.  We have scheduled you for a follow up appointment on Monday, 12-5 at 8:30 am.   Thank you for entrusting me with your care and for choosing [facility], Dr. Napoleon Cellar

## 2021-04-03 NOTE — Progress Notes (Signed)
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 2) Remote history of metastatic renal cell carcinoma Diagnosed with renal cell carcinoma 20 years ago and had a right nephrectomy. About 8 years after that he was noted to have abdominal recurrence in his pancreas spleen and small intestine and had a Whipple's procedure and significant abdominal surgery and notes that 10 out of 17 lymph nodes were positive. He also had his gallbladder removed. Postoperative course was complicated by an internal hemorrhage as per his report. Patient notes 2-3 years after that he had recurrence in his thyroid that led to a thyroidectomy. [June 2004] later he had partial gastrectomy for local recurrence. [February 2005] when he presented with GI bleeding. Patient notes that he has had no known evidence of recurrence over the last 8 years until his recent CT scan showed lung nodules.  Current Treatment: Sutent 37.5 mg po daily for 2 weeks on and 1 weeks off. (on hold now from 10/2020) Previous treatment Sutent on cycle 1 - 50 mg by mouth daily for 4 weeks on and 2-weeks off. It was dose adjusted to 37.5 mg by mouth daily for 2 weeks with 1 week of to help mitigate issues with fatigue, mild hyperbilirubinemia, cytopenias. SBRT to dominant RML nodule.   INTERVAL HISTORY:  Mr. Streeper was seen in follow-up for his renal cell carcinoma after his last clinic visit on 12/26/2020. He remains off Sutent and has been following with his primary care physician and nephrologist for management of his other medical issues. Still having some issues with lower extremity  swelling. No new focal symptoms.  No shortness of breath.  No new chest pain.  No new abdominal pain.  No change in bowel habits or urinary habits. Some increase in creatinine in the context of adding/dose increasing ARB. No other acute new symptoms.  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 Past Surgical History:  Procedure Laterality Date   CATARACT EXTRACTION Bilateral 2013   CHOLECYSTECTOMY     GASTRECTOMY     tumor removed   LIPOMA EXCISION Right 1999   NEPHRECTOMY Right    PANCREATECTOMY     SPLENECTOMY     THYROIDECTOMY     URETERAL STENT PLACEMENT Left 2012   WHIPPLE PROCEDURE      . Social History   Tobacco Use   Smoking status: Former    Years: 1.50    Types: Cigarettes   Smokeless tobacco: Never  Vaping Use   Vaping  Use: Never used  Substance Use Topics   Alcohol use: Yes    Comment: occasional beer   Drug use: No    ALLERGIES:  is allergic to iodine.  MEDICATIONS:  Current Outpatient Medications  Medication Sig Dispense Refill   aspirin 81 MG tablet Take 81 mg by mouth daily.      atenolol (TENORMIN) 100 MG tablet Take 100 mg by mouth daily.      augmented betamethasone dipropionate (DIPROLENE-AF) 0.05 % cream Apply 1 application topically daily as needed (rash).   0   azelastine (OPTIVAR) 0.05 %  ophthalmic solution Apply 1 drop to eye in the morning and at bedtime. As needed     calcium citrate-vitamin D (CITRACAL+D) 315-200 MG-UNIT per tablet Take 2 tablets by mouth 2 (two) times daily.      Cholecalciferol (VITAMIN D-3 PO) Take 1,000 Units by mouth daily.     Cyanocobalamin (VITAMIN B-12) 2500 MCG SUBL Take 2,500 mcg by mouth daily.      diclofenac Sodium (VOLTAREN) 1 % GEL Apply 3 g topically 3 (three) times daily as needed (joint pain).      dicyclomine (BENTYL) 10 MG capsule Take 1 capsule (10 mg total) by mouth every 8 (eight) hours as needed for spasms. 30 capsule 1   docusate sodium (COLACE) 100 MG capsule Take 100 mg by mouth 2 (two) times daily as needed for mild constipation.      dorzolamide-timolol (COSOPT) 22.3-6.8 MG/ML ophthalmic solution Place 1 drop into both eyes 2 (two) times daily.   1   famotidine (PEPCID) 20 MG tablet Take 20 mg by mouth 3 (three) times daily.     gabapentin (NEURONTIN) 300 MG capsule Take 300 mg by mouth as needed.     Glucosamine-MSM-Hyaluronic Acd 750-375-30 MG TABS Take 2 tablets by mouth daily.     HYDROcodone-acetaminophen (NORCO/VICODIN) 5-325 MG tablet Take 1 tablet by mouth every 6 (six) hours as needed.     insulin lispro (HUMALOG) 100 UNIT/ML injection Inject 2-4 Units into the skin 3 (three) times daily before meals. Per sliding scale     Lactobacillus (ACIDOPHILUS) CAPS capsule Take 1 capsule by mouth daily.     latanoprost (XALATAN) 0.005 % ophthalmic solution Place 1 drop into both eyes at bedtime.   1   LEVEMIR 100 UNIT/ML injection Inject 7 Units into the skin 2 (two) times daily.  1   levothyroxine (SYNTHROID) 200 MCG tablet Take 250 mcg by mouth every morning.     losartan (COZAAR) 25 MG tablet Take 100 mg by mouth daily.     lovastatin (MEVACOR) 40 MG tablet Take 40 mg by mouth at bedtime.      Omega-3 Fatty Acids (FISH OIL) 1200 MG CAPS Take 1 capsule by mouth daily.     omeprazole (PRILOSEC) 20 MG capsule Take 1 capsule (20 mg  total) by mouth daily. 30 capsule 3   Pancrelipase, Lip-Prot-Amyl, 6000-19000 units CPEP Take 4 capsules by mouth 3 (three) times daily.     torsemide (DEMADEX) 20 MG tablet Take 20 mg by mouth daily.     traMADol (ULTRAM) 50 MG tablet Take 50 mg by mouth 2 (two) times daily as needed.     Multiple Vitamins-Minerals (PRESERVISION AREDS 2 PO) Take by mouth in the morning and at bedtime.     No current facility-administered medications for this visit.    REVIEW OF SYSTEMS:   .10 Point review of Systems was done is negative except as noted above.  PHYSICAL EXAMINATION: ECOG FS:1 - Symptomatic but completely ambulatory  Vitals:   03/28/21 1015  BP: (!) 171/71  Pulse: (!) 50  Resp: 20  Temp: 97.8 F (36.6 C)  SpO2: 97%   Wt Readings from Last 3 Encounters:  04/01/21 191 lb (86.6 kg)  03/28/21 193 lb 14.4 oz (88 kg)  12/19/20 177 lb (80.3 kg)   Body mass index is 28.63 kg/m.      Telehealth Visit.  LABORATORY DATA:   I have reviewed the data as listed  . CBC Latest Ref Rng & Units 03/28/2021 12/12/2020 09/18/2020  WBC 4.0 - 10.5 K/uL 7.4 9.0 5.2  Hemoglobin 13.0 - 17.0 g/dL 14.2 13.4 13.5  Hematocrit 39.0 - 52.0 % 42.7 40.7 39.2  Platelets 150 - 400 K/uL 209 233 169  ANC 1.4k . CBC    Component Value Date/Time   WBC 7.4 03/28/2021 0939   WBC 5.2 09/18/2020 1112   RBC 4.04 (L) 03/28/2021 0939   HGB 14.2 03/28/2021 0939   HGB 14.0 06/11/2017 0808   HCT 42.7 03/28/2021 0939   HCT 40.7 06/11/2017 0808   PLT 209 03/28/2021 0939   PLT 223 06/11/2017 0808   MCV 105.7 (H) 03/28/2021 0939   MCV 112.1 (H) 06/11/2017 0808   MCH 35.1 (H) 03/28/2021 0939   MCHC 33.3 03/28/2021 0939   RDW 13.3 03/28/2021 0939   RDW 13.9 06/11/2017 0808   LYMPHSABS 2.1 03/28/2021 0939   LYMPHSABS 1.6 06/11/2017 0808   MONOABS 1.1 (H) 03/28/2021 0939   MONOABS 0.5 06/11/2017 0808   EOSABS 0.2 03/28/2021 0939   EOSABS 0.1 06/11/2017 0808   BASOSABS 0.1 03/28/2021 0939   BASOSABS 0.0  06/11/2017 0808   . CMP Latest Ref Rng & Units 03/28/2021 01/14/2021 12/12/2020  Glucose 70 - 99 mg/dL 244(H) - 235(H)  BUN 8 - 23 mg/dL 26(H) - 29(H)  Creatinine 0.61 - 1.24 mg/dL 2.32(H) - 1.93(H)  Sodium 135 - 145 mmol/L 138 - 141  Potassium 3.5 - 5.1 mmol/L 4.8 - 4.0  Chloride 98 - 111 mmol/L 105 - 109  CO2 22 - 32 mmol/L 27 - 24  Calcium 8.9 - 10.3 mg/dL 8.2(L) - 8.2(L)  Total Protein 6.5 - 8.1 g/dL 5.1(L) 5.1(L) 4.7(L)  Total Bilirubin 0.3 - 1.2 mg/dL 0.8 0.7 0.7  Alkaline Phos 38 - 126 U/L 181(H) 186(H) 183(H)  AST 15 - 41 U/L 45(H) 44(H) 55(H)  ALT 0 - 44 U/L 41 51 74(H)   08/18/2019 CT Abdomen Pelvis Wo Contrast (Accession [id number])   08/18/2019 CT Chest Wo Contrast (Accession [id number])   RADIOGRAPHIC STUDIES: I have personally reviewed the radiological images as listed and agreed with the findings in the report. No results found.    ASSESSMENT & PLAN:   70 y.o. Caucasian male with  #1 Recurrent metastatic Renal cell carcinoma with multiple lung metastases.  Has been on stable on 1st line Sutent for >6yr. Has had SBRT to the dominant right lung nodule with partial response. No lab or clinical evidence of metastatic RCC progression at this time.  PET/CT 08/08/2016 with a couple of mildly enlarge LLL pulmonary nodules - no other overt evidence of RCC progression at this time.  PET CT scan 11/06/2016 - no overt evidence of RCC progression at this time .  PET/CT 03/13/2017 - no overt evidence of RCC progression at this time .  PET/CT 08/10/2017 - no overt evidence of RCC progression at this time .  PET/ CT done 08/10/2017, shows no  evidence of  Progression at this time.  12/07/17 PET which revealed No findings suspicious for recurrent or metastatic disease.  03/22/18 PET/CT revealed One of the left lower lobe nodules has very minimally enlarged over the last 2 years, currently 0.6 by 0.4 cm and measuring 0.4 by 0.3 cm on 03/20/2016. This probably represents a previously  treated metastatic nodule given that it measured 1.0 by 0.8 cm on 06/06/2015. This nodule may be subject to some slice selection phenomenon and also motion artifact given proximity to the diaphragm. Surveillance is likely warranted. 2. Chronic central lucency, marginal bony deposition, and slow healing of the right lateral fourth rib fracture. Possibilities may include underlying radiation necrosis as a cause for fracture leading to slow healing, or a low-grade pathologic lesion. Maximum SUV at this fracture site is 2.9, previously 2.4. 3. 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 There is only low-grade activity in this vicinity characteristic of radiation fibrosis, without focal activity. 4. Other imaging findings of potential clinical significance: Aortic Atherosclerosis. Coronary atherosclerosis. Mild cardiomegaly. Right nephrectomy. 8 mm nonobstructive calculus in the left kidney lower pole.   08/24/18 CT C/A/P revealed "Stable small left lower lobe pulmonary nodules from recent prior studies. As previously noted, these have decreased in size from PET-CT 06/06/2015, likely treated metastases. No new or enlarging pulmonary nodules. 2. Postsurgical changes in the upper abdomen without evidence of local recurrence or metastatic disease. 3. Nonobstructing left renal calculus. 4. Stable radiation changes in the right upper lobe with probable chronic pathologic fracture of the right 4th rib laterally."  03/07/2019 CT C/A wo contrast revealed "1. Stable left lower lobe pulmonary nodules, likely treated Metastases. 2. No additional evidence of metastatic disease. 3. Left renal stone. 4. Post radiation scarring in the right hemithorax with a healed adjacent right rib fracture which is presumably pathologic in Etiology. 5.  Aortic atherosclerosis (ICD10-170.0). 6. 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 Confirmed by [place].  #4 mild thrombocytopenia likely related to Sutent- resolved with dose reduction. #5 Abnormal Liver function test - significant bump in transaminases. -resolved on labs today #6 grade 1 fatigue due to Sutent -better with dose reduction and changing schedule to 2 week on and 1 week. #7 Dysguesia from Sutent - manageable per patient. He is trying to eat as well as he can and is maintaining his body weight.  #8 chronic kidney disease/single kidney -following with Dr Candiss Norse for mx with Nephrotic syndrome #10 rheumatoid arthritis -follows with Dr. Estanislado Pandy for mx #11 DM2 -continue f/u with Dr Philip Aspen for continue mx, blood sugar at home per patient.    PLAN: -Patients labs were reviewed with him today. -CBC within normal limits -CMP creatinine slightly higher at 2.3 up from 1.9. -LDH slightly elevated at 293. -No lab or clinical evidence of renal cell carcinoma recurrence at this time. Will continue watchful observation. -Continue to hold the 37.5 mg Sutent at this time-- in the context of NED status from [place] and significant issues with anasarca and nephrotic syndrome. -Will see back in 3 month with labs.   FOLLOW UP: CT chest /abd/pelvis in 11 weeks RTC with Dr Irene Limbo with labs in 12 weeks    The total time spent in the appointment was 20 minutes and more than 50% was on counseling and direct patient cares.  All  of the patient's questions were answered with apparent satisfaction. The patient knows to call the clinic with any problems, questions or concerns.   Sullivan Lone MD [place] AAHIVMS [facility] Hematology/Oncology Physician [facility]  (Office):       [phone] (Work cell):  [phone] (Fax):           [phone]

## 2021-04-18 ENCOUNTER — Telehealth: Payer: Self-pay | Admitting: Gastroenterology

## 2021-04-18 NOTE — Telephone Encounter (Signed)
He can come in the week before or after his vacation to get his vaccine. Thanks

## 2021-04-18 NOTE — Telephone Encounter (Signed)
Inbound call from pt's spouse requesting a call back stating that she wanted ask a question regarding there pt's appt coming up in December. Please advise. Thank you.

## 2021-04-18 NOTE — Telephone Encounter (Signed)
Spoke with patient and his wife in regards to information. They prefer to come in the week after. We do not have January schedule at this time.Pt and his wife have been advised to call us back in late November so we can get his appts rescheduled. They had no concerns at the end of the call.

## 2021-04-18 NOTE — Telephone Encounter (Signed)
Spoke with patient, he states that his currently scheduled follow up appt is conflicting with his travel schedule for the holidays. Pt will be due for his 3rd Twinrix vaccine around 07/26/21 but patient will be out of town 12/22-12/29. We do not have the January schedule at this time. Advised that we do not want to wait too long to get his 3rd Twinrix. Dr. Havery Moros please advise on OK timing for 3rd Twinrix, how early/late can he come in for this? Please advise, thanks.

## 2021-06-12 ENCOUNTER — Other Ambulatory Visit: Payer: Self-pay

## 2021-06-12 ENCOUNTER — Inpatient Hospital Stay: Payer: Medicare Other | Attending: Hematology

## 2021-06-12 DIAGNOSIS — Z87891 Personal history of nicotine dependence: Secondary | ICD-10-CM | POA: Diagnosis not present

## 2021-06-12 DIAGNOSIS — E1122 Type 2 diabetes mellitus with diabetic chronic kidney disease: Secondary | ICD-10-CM | POA: Insufficient documentation

## 2021-06-12 DIAGNOSIS — I129 Hypertensive chronic kidney disease with stage 1 through stage 4 chronic kidney disease, or unspecified chronic kidney disease: Secondary | ICD-10-CM | POA: Insufficient documentation

## 2021-06-12 DIAGNOSIS — M069 Rheumatoid arthritis, unspecified: Secondary | ICD-10-CM | POA: Diagnosis not present

## 2021-06-12 DIAGNOSIS — N189 Chronic kidney disease, unspecified: Secondary | ICD-10-CM | POA: Diagnosis not present

## 2021-06-12 DIAGNOSIS — C7802 Secondary malignant neoplasm of left lung: Secondary | ICD-10-CM | POA: Insufficient documentation

## 2021-06-12 DIAGNOSIS — C649 Malignant neoplasm of unspecified kidney, except renal pelvis: Secondary | ICD-10-CM | POA: Diagnosis present

## 2021-06-12 LAB — CMP (CANCER CENTER ONLY)
ALT: 62 U/L — ABNORMAL HIGH (ref 0–44)
AST: 62 U/L — ABNORMAL HIGH (ref 15–41)
Albumin: 2.3 g/dL — ABNORMAL LOW (ref 3.5–5.0)
Alkaline Phosphatase: 197 U/L — ABNORMAL HIGH (ref 38–126)
Anion gap: 7 (ref 5–15)
BUN: 36 mg/dL — ABNORMAL HIGH (ref 8–23)
CO2: 24 mmol/L (ref 22–32)
Calcium: 7.7 mg/dL — ABNORMAL LOW (ref 8.9–10.3)
Chloride: 107 mmol/L (ref 98–111)
Creatinine: 2.61 mg/dL — ABNORMAL HIGH (ref 0.61–1.24)
GFR, Estimated: 25 mL/min — ABNORMAL LOW (ref >60)
Glucose, Bld: 213 mg/dL — ABNORMAL HIGH (ref 70–99)
Potassium: 3.7 mmol/L (ref 3.5–5.1)
Sodium: 138 mmol/L (ref 135–145)
Total Bilirubin: 0.6 mg/dL (ref 0.3–1.2)
Total Protein: 5 g/dL — ABNORMAL LOW (ref 6.5–8.1)

## 2021-06-12 LAB — CBC WITH DIFFERENTIAL/PLATELET
Abs Immature Granulocytes: 0.01 10*3/uL (ref 0.00–0.07)
Basophils Absolute: 0.1 10*3/uL (ref 0.0–0.1)
Basophils Relative: 1 %
Eosinophils Absolute: 0.2 10*3/uL (ref 0.0–0.5)
Eosinophils Relative: 3 %
HCT: 40.5 % (ref 39.0–52.0)
Hemoglobin: 13.3 g/dL (ref 13.0–17.0)
Immature Granulocytes: 0 %
Lymphocytes Relative: 31 %
Lymphs Abs: 1.9 10*3/uL (ref 0.7–4.0)
MCH: 33.7 pg (ref 26.0–34.0)
MCHC: 32.8 g/dL (ref 30.0–36.0)
MCV: 102.5 fL — ABNORMAL HIGH (ref 80.0–100.0)
Monocytes Absolute: 1 10*3/uL (ref 0.1–1.0)
Monocytes Relative: 17 %
Neutro Abs: 2.9 10*3/uL (ref 1.7–7.7)
Neutrophils Relative %: 48 %
Platelets: 187 10*3/uL (ref 150–400)
RBC: 3.95 MIL/uL — ABNORMAL LOW (ref 4.22–5.81)
RDW: 12.6 % (ref 11.5–15.5)
WBC: 6 10*3/uL (ref 4.0–10.5)
nRBC: 0 % (ref 0.0–0.2)

## 2021-06-12 LAB — LACTATE DEHYDROGENASE: LDH: 264 U/L — ABNORMAL HIGH (ref 98–192)

## 2021-06-13 ENCOUNTER — Ambulatory Visit (HOSPITAL_COMMUNITY)
Admission: RE | Admit: 2021-06-13 | Discharge: 2021-06-13 | Disposition: A | Discharge status: Home / Self Care | Payer: Medicare Other | Source: Ambulatory Visit | Attending: Hematology | Admitting: Hematology

## 2021-06-13 DIAGNOSIS — I700 Atherosclerosis of aorta: Secondary | ICD-10-CM | POA: Diagnosis not present

## 2021-06-13 DIAGNOSIS — S2231XA Fracture of one rib, right side, initial encounter for closed fracture: Secondary | ICD-10-CM | POA: Diagnosis not present

## 2021-06-13 DIAGNOSIS — I2510 Atherosclerotic heart disease of native coronary artery without angina pectoris: Secondary | ICD-10-CM | POA: Insufficient documentation

## 2021-06-13 DIAGNOSIS — Z9081 Acquired absence of spleen: Secondary | ICD-10-CM | POA: Diagnosis not present

## 2021-06-13 DIAGNOSIS — X58XXXA Exposure to other specified factors, initial encounter: Secondary | ICD-10-CM | POA: Diagnosis not present

## 2021-06-13 DIAGNOSIS — Z923 Personal history of irradiation: Secondary | ICD-10-CM | POA: Diagnosis not present

## 2021-06-13 DIAGNOSIS — N189 Chronic kidney disease, unspecified: Secondary | ICD-10-CM | POA: Diagnosis not present

## 2021-06-13 DIAGNOSIS — C649 Malignant neoplasm of unspecified kidney, except renal pelvis: Secondary | ICD-10-CM | POA: Insufficient documentation

## 2021-06-13 DIAGNOSIS — Z905 Acquired absence of kidney: Secondary | ICD-10-CM | POA: Diagnosis not present

## 2021-06-20 ENCOUNTER — Inpatient Hospital Stay: Payer: Medicare Other | Admitting: Hematology

## 2021-06-20 ENCOUNTER — Other Ambulatory Visit: Payer: Medicare Other

## 2021-06-20 ENCOUNTER — Other Ambulatory Visit: Payer: Self-pay

## 2021-06-20 VITALS — BP 177/88 | HR 41 | Temp 97.2°F | Resp 18 | Wt 175.5 lb

## 2021-06-20 DIAGNOSIS — C649 Malignant neoplasm of unspecified kidney, except renal pelvis: Secondary | ICD-10-CM

## 2021-06-20 NOTE — Progress Notes (Signed)
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 2) Remote history of metastatic renal cell carcinoma Diagnosed with renal cell carcinoma 20 years ago and had a right nephrectomy. About 8 years after that he was noted to have abdominal recurrence in his pancreas spleen and small intestine and had a Whipple's procedure and significant abdominal surgery and notes that 10 out of 17 lymph nodes were positive. He also had his gallbladder removed. Postoperative course was complicated by an internal hemorrhage as per his report. Patient notes 2-3 years after that he had recurrence in his thyroid that led to a thyroidectomy. [June 2004] later he had partial gastrectomy for local recurrence. [February 2005] when he presented with GI bleeding. Patient notes that he has had no known evidence of recurrence over the last 8 years until his recent CT scan showed lung nodules.  Current Treatment: Sutent 37.5 mg po daily for 2 weeks on and 1 weeks off. (on hold now from 10/2020) Previous treatment Sutent on cycle 1 - 50 mg by mouth daily for 4 weeks on and 2-weeks off. It was dose adjusted to 37.5 mg by mouth daily for 2 weeks with 1 week of to help mitigate issues with fatigue, mild hyperbilirubinemia, cytopenias. SBRT to dominant RML nodule.   INTERVAL HISTORY:  Dylan Jefferson was seen in follow-up for his renal cell carcinoma after his last clinic visit on 03/28/2021. He remains off Sutent and has been following with his primary care physician and nephrologist for management of his other medical issues. Still having some issues with lower extremity  swelling. No new focal symptoms.  No shortness of breath.  No new chest pain.  No new abdominal pain.  No change in bowel habits or urinary habits. Some increase in creatinine in the context of adding/dose increasing ARB. No other acute new symptoms.  Past Medical History:  Diagnosis Date   Arthritis    Cancer ([place])    Renal cell   DDD (degenerative disc disease), cervical 05/28/2016   DDD (degenerative disc disease), lumbar 05/28/2016   Depression    Diabetes ([place])    Hyperlipidemia 05/28/2016   Hypertension    Hypothyroidism    Inflammatory polyarthritis ([place]) 05/28/2016   Sero Negative, Ultrasound positive synovitis    Kidney disease    Macular degeneration    Bilateral eyes   Nephrolithiasis    Osteoarthritis of both hands 05/28/2016   Osteoarthritis of both knees 05/28/2016   Peripheral vascular disorder ([place]) 05/28/2016   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   Rheumatoid arthritis ([place])    Thyroid cancer ([place]) 05/28/2016    . Past Surgical History:  Procedure Laterality Date   CATARACT EXTRACTION Bilateral 2013   CHOLECYSTECTOMY     GASTRECTOMY     tumor removed   LIPOMA EXCISION Right 1999   NEPHRECTOMY Right    PANCREATECTOMY     SPLENECTOMY     THYROIDECTOMY     URETERAL STENT PLACEMENT Left 2012   WHIPPLE PROCEDURE      . Social History   Tobacco Use   Smoking status: Former    Years: 1.50    Types: Cigarettes   Smokeless tobacco: Never  Vaping Use   Vaping  Use: Never used  Substance Use Topics   Alcohol use: Yes    Comment: occasional beer   Drug use: No    ALLERGIES:  is allergic to iodine.  MEDICATIONS:  Current Outpatient Medications  Medication Sig Dispense Refill   aspirin 81 MG tablet Take 81 mg by mouth daily.      atenolol (TENORMIN) 100 MG tablet Take 100 mg by mouth daily.      augmented betamethasone dipropionate (DIPROLENE-AF) 0.05 % cream Apply 1 application topically daily as needed (rash).   0   azelastine (OPTIVAR) 0.05 %  ophthalmic solution Apply 1 drop to eye in the morning and at bedtime. As needed     calcium citrate-vitamin D (CITRACAL+D) 315-200 MG-UNIT per tablet Take 2 tablets by mouth 2 (two) times daily.      Cholecalciferol (VITAMIN D-3 PO) Take 1,000 Units by mouth daily.     Cyanocobalamin (VITAMIN B-12) 2500 MCG SUBL Take 2,500 mcg by mouth daily.      diclofenac Sodium (VOLTAREN) 1 % GEL Apply 3 g topically 3 (three) times daily as needed (joint pain).      dicyclomine (BENTYL) 10 MG capsule Take 1 capsule (10 mg total) by mouth every 8 (eight) hours as needed for spasms. 30 capsule 1   docusate sodium (COLACE) 100 MG capsule Take 100 mg by mouth 2 (two) times daily as needed for mild constipation.      dorzolamide-timolol (COSOPT) 22.3-6.8 MG/ML ophthalmic solution Place 1 drop into both eyes 2 (two) times daily.   1   famotidine (PEPCID) 20 MG tablet Take 20 mg by mouth 3 (three) times daily.     gabapentin (NEURONTIN) 300 MG capsule Take 300 mg by mouth as needed.     Glucosamine-MSM-Hyaluronic Acd 750-375-30 MG TABS Take 2 tablets by mouth daily.     HYDROcodone-acetaminophen (NORCO/VICODIN) 5-325 MG tablet Take 1 tablet by mouth every 6 (six) hours as needed.     insulin lispro (HUMALOG) 100 UNIT/ML injection Inject 2-4 Units into the skin 3 (three) times daily before meals. Per sliding scale     Lactobacillus (ACIDOPHILUS) CAPS capsule Take 1 capsule by mouth daily.     latanoprost (XALATAN) 0.005 % ophthalmic solution Place 1 drop into both eyes at bedtime.   1   LEVEMIR 100 UNIT/ML injection Inject 7 Units into the skin 2 (two) times daily.  1   levothyroxine (SYNTHROID) 200 MCG tablet Take 250 mcg by mouth every morning.     losartan (COZAAR) 25 MG tablet Take 100 mg by mouth daily.     lovastatin (MEVACOR) 40 MG tablet Take 40 mg by mouth at bedtime.      Multiple Vitamins-Minerals (PRESERVISION AREDS 2 PO) Take by mouth in the morning and at bedtime.     Omega-3 Fatty Acids (FISH OIL)  1200 MG CAPS Take 1 capsule by mouth daily.     omeprazole (PRILOSEC) 20 MG capsule Take 1 capsule (20 mg total) by mouth daily. 30 capsule 3   Pancrelipase, Lip-Prot-Amyl, 6000-19000 units CPEP Take 4 capsules by mouth 3 (three) times daily.     torsemide (DEMADEX) 20 MG tablet Take 20 mg by mouth daily.     traMADol (ULTRAM) 50 MG tablet Take 50 mg by mouth 2 (two) times daily as needed.     No current facility-administered medications for this visit.    REVIEW OF SYSTEMS:   .10 Point review of Systems was done is negative except as noted above.  PHYSICAL EXAMINATION: ECOG FS:1 - Symptomatic but completely ambulatory  Vitals:   06/20/21 1058  BP: (!) 177/88  Pulse: (!) 41  Resp: 18  Temp: (!) 97.2 F (36.2 C)  SpO2: 98%   Wt Readings from Last 3 Encounters:  06/20/21 175 lb 8 oz (79.6 kg)  04/01/21 191 lb (86.6 kg)  03/28/21 193 lb 14.4 oz (88 kg)   Body mass index is 25.92 kg/m.      Telehealth Visit.  LABORATORY DATA:   I have reviewed the data as listed  . CBC Latest Ref Rng & Units 06/12/2021 03/28/2021 12/12/2020  WBC 4.0 - 10.5 K/uL 6.0 7.4 9.0  Hemoglobin 13.0 - 17.0 g/dL 13.3 14.2 13.4  Hematocrit 39.0 - 52.0 % 40.5 42.7 40.7  Platelets 150 - 400 K/uL 187 209 233  ANC 1.4k . CBC    Component Value Date/Time   WBC 6.0 06/12/2021 0944   RBC 3.95 (L) 06/12/2021 0944   HGB 13.3 06/12/2021 0944   HGB 14.2 03/28/2021 0939   HGB 14.0 06/11/2017 0808   HCT 40.5 06/12/2021 0944   HCT 40.7 06/11/2017 0808   PLT 187 06/12/2021 0944   PLT 209 03/28/2021 0939   PLT 223 06/11/2017 0808   MCV 102.5 (H) 06/12/2021 0944   MCV 112.1 (H) 06/11/2017 0808   MCH 33.7 06/12/2021 0944   MCHC 32.8 06/12/2021 0944   RDW 12.6 06/12/2021 0944   RDW 13.9 06/11/2017 0808   LYMPHSABS 1.9 06/12/2021 0944   LYMPHSABS 1.6 06/11/2017 0808   MONOABS 1.0 06/12/2021 0944   MONOABS 0.5 06/11/2017 0808   EOSABS 0.2 06/12/2021 0944   EOSABS 0.1 06/11/2017 0808   BASOSABS 0.1  06/12/2021 0944   BASOSABS 0.0 06/11/2017 0808   . CMP Latest Ref Rng & Units 06/12/2021 03/28/2021 01/14/2021  Glucose 70 - 99 mg/dL 213(H) 244(H) -  BUN 8 - 23 mg/dL 36(H) 26(H) -  Creatinine 0.61 - 1.24 mg/dL 2.61(H) 2.32(H) -  Sodium 135 - 145 mmol/L 138 138 -  Potassium 3.5 - 5.1 mmol/L 3.7 4.8 -  Chloride 98 - 111 mmol/L 107 105 -  CO2 22 - 32 mmol/L 24 27 -  Calcium 8.9 - 10.3 mg/dL 7.7(L) 8.2(L) -  Total Protein 6.5 - 8.1 g/dL 5.0(L) 5.1(L) 5.1(L)  Total Bilirubin 0.3 - 1.2 mg/dL 0.6 0.8 0.7  Alkaline Phos 38 - 126 U/L 197(H) 181(H) 186(H)  AST 15 - 41 U/L 62(H) 45(H) 44(H)  ALT 0 - 44 U/L 62(H) 41 51   08/18/2019 CT Abdomen Pelvis Wo Contrast (Accession [id number])   08/18/2019 CT Chest Wo Contrast (Accession [id number])   RADIOGRAPHIC STUDIES: I have personally reviewed the radiological images as listed and agreed with the findings in the report. CT CHEST ABDOMEN PELVIS WO CONTRAST  Result Date: 06/14/2021 CLINICAL DATA:  Renal cell cancer EXAM: CT CHEST, ABDOMEN AND PELVIS WITHOUT CONTRAST TECHNIQUE: Multidetector CT imaging of the chest, abdomen and pelvis was performed following the standard protocol without IV contrast. COMPARISON:  CT abdomen/pelvis dated 12/24/2020. CT chest abdomen pelvis dated 10/03/2020. FINDINGS: CT CHEST FINDINGS Cardiovascular: The heart is normal in size. No pericardial effusion. No evidence of thoracic aortic aneurysm. Mild atherosclerotic calcifications of the aortic arch. Coronary atherosclerosis of the LAD and right coronary artery. Mediastinum/Nodes: No suspicious mediastinal lymphadenopathy. Status post thyroidectomy. Lungs/Pleura: Stable 3.6 x 1.3 cm irregular bandlike opacity along the right minor fissure (series 4/image 85) with suspected surrounding radiation changes. Additional patchy opacity/radiation changes in the anterior right  lower lobe (series 4/image 94). Two 4 mm left lower lobe nodules (series 4/images 144 and 149), unchanged,  benign. Calcified granuloma in the lateral right upper lobe (series 4/image 69), benign. No new/suspicious pulmonary nodules. Mild centrilobular and paraseptal emphysematous changes, upper lung predominant. Musculoskeletal: Stable pathologic fracture involving the right lateral 4th rib (series 4/image 34), favored to be secondary to radiation change. Thoracic spine is within normal limits. CT ABDOMEN PELVIS FINDINGS Hepatobiliary: Unenhanced liver is unremarkable. Calcified granuloma in the posterior right hepatic lobe. Status post cholecystectomy. No intrahepatic or extrahepatic ductal dilatation. Pancreas: Surgically absent. Spleen: Surgically absent. Adrenals/Urinary Tract: Adrenal glands are within normal limits. Status post right nephrectomy. No abnormal soft tissue in the surgical bed. 6 mm nonobstructing left lower pole renal calculus (series 2/image 81). No ureteral or bladder calculi. No hydronephrosis. Bladder is mildly thick-walled although underdistended. Stomach/Bowel: Status post partial gastrectomy with gastrojejunostomy. No evidence of bowel obstruction. Normal appendix (series 2/image 100). No colonic wall thickening or inflammatory changes. Vascular/Lymphatic: No evidence of abdominal aortic aneurysm. Atherosclerotic calcifications of the abdominal aorta and branch vessels. No suspicious abdominopelvic lymphadenopathy. Reproductive: Prostatomegaly, with enlargement of the central gland, indenting the base of the bladder. Other: No abdominopelvic ascites. Musculoskeletal: Mild degenerative changes at L5-S1. Mild superior endplate changes at L3 (sagittal image 119), new/progressive. No retropulsion. No focal osseous lesions. IMPRESSION: Status post right nephrectomy. No evidence of recurrent or metastatic disease. Radiation changes in the right lung. Associated pathologic fracture of the right 4th rib. Postsurgical changes related to Whipple procedure with pancreatectomy and splenectomy. Mild  superior endplate changes at L3, new/progressive. No retropulsion. Additional ancillary findings as above. Electronically Signed   By: Julian Hy M.D.   On: 06/14/2021 03:39      ASSESSMENT & PLAN:   70 y.o. Caucasian male with  #1 Recurrent metastatic Renal cell carcinoma with multiple lung metastases.  Has been on stable on 1st line Sutent for >42yr. Has had SBRT to the dominant right lung nodule with partial response. No lab or clinical evidence of metastatic RCC progression at this time.  PET/CT 08/08/2016 with a couple of mildly enlarge LLL pulmonary nodules - no other overt evidence of RCC progression at this time.  PET CT scan 11/06/2016 - no overt evidence of RCC progression at this time .  PET/CT 03/13/2017 - no overt evidence of RCC progression at this time .  PET/CT 08/10/2017 - no overt evidence of RCC progression at this time .  PET/ CT done 08/10/2017, shows no evidence of  Progression at this time.  12/07/17 PET which revealed No findings suspicious for recurrent or metastatic disease.  03/22/18 PET/CT revealed One of the left lower lobe nodules has very minimally enlarged over the last 2 years, currently 0.6 by 0.4 cm and measuring 0.4 by 0.3 cm on 03/20/2016. This probably represents a previously treated metastatic nodule given that it measured 1.0 by 0.8 cm on 06/06/2015. This nodule may be subject to some slice selection phenomenon and also motion artifact given proximity to the diaphragm. Surveillance is likely warranted. 2. Chronic central lucency, marginal bony deposition, and slow healing of the right lateral fourth rib fracture. Possibilities may include underlying radiation necrosis as a cause for fracture leading to slow healing, or a low-grade pathologic lesion. Maximum SUV at this fracture site is 2.9, previously 2.4. 3. 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 There is only low-grade  activity  in this vicinity characteristic of radiation fibrosis, without focal activity. 4. Other imaging findings of potential clinical significance: Aortic Atherosclerosis. Coronary atherosclerosis. Mild cardiomegaly. Right nephrectomy. 8 mm nonobstructive calculus in the left kidney lower pole.   08/24/18 CT C/A/P revealed "Stable small left lower lobe pulmonary nodules from recent prior studies. As previously noted, these have decreased in size from PET-CT 06/06/2015, likely treated metastases. No new or enlarging pulmonary nodules. 2. Postsurgical changes in the upper abdomen without evidence of local recurrence or metastatic disease. 3. Nonobstructing left renal calculus. 4. Stable radiation changes in the right upper lobe with probable chronic pathologic fracture of the right 4th rib laterally."  03/07/2019 CT C/A wo contrast revealed "1. Stable left lower lobe pulmonary nodules, likely treated Metastases. 2. No additional evidence of metastatic disease. 3. Left renal stone. 4. Post radiation scarring in the right hemithorax with a healed adjacent right rib fracture which is presumably pathologic in Etiology. 5.  Aortic atherosclerosis (ICD10-170.0). 6. 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 Confirmed by [place].  #4 mild thrombocytopenia likely related to Sutent- resolved with dose reduction. #5 Abnormal Liver function test - significant bump in transaminases. -resolved on labs today #6 grade 1 fatigue due to Sutent -better with dose reduction and changing schedule to 2 week on and 1 week. #7 Dysguesia from Sutent - manageable per patient. He is trying to eat as well as he can and is maintaining his body weight.  #8 chronic kidney disease/single kidney -following with Dr Candiss Norse for mx with Nephrotic syndrome #10 rheumatoid arthritis -follows with Dr. Estanislado Pandy for mx #11 DM2  -continue f/u with Dr Philip Aspen for continue mx, blood sugar at home per patient.  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 -CBC within normal limits -CMP creatinine slightly higher at 2.3 up from 1.9. -LDH slightly elevated at 293. -No lab or clinical evidence of renal cell carcinoma recurrence at this time. Will continue watchful observation. -Continue to hold the 37.5 mg Sutent at this time-- in the context of NED status from [place] and significant issues with anasarca and nephrotic syndrome. -Will see back in 3 month with labs.   FOLLOW UP: CT chest /abd/pelvis in 11 weeks RTC with Dr Irene Limbo with labs in 12 weeks    The total time spent in the appointment was 20 minutes and more than 50% was on counseling and direct patient cares.  All of the patient's questions were answered with apparent satisfaction. The patient knows to call the clinic with any problems, questions or concerns.   Sullivan Lone MD [place] AAHIVMS [facility] Hematology/Oncology Physician [facility]  (Office):       [phone] (Work cell):  [phone] (Fax):           [phone]

## 2021-07-08 ENCOUNTER — Ambulatory Visit: Payer: Medicare Other | Admitting: Gastroenterology

## 2021-07-15 ENCOUNTER — Telehealth: Payer: Self-pay

## 2021-07-15 ENCOUNTER — Other Ambulatory Visit: Payer: Self-pay

## 2021-07-15 NOTE — Progress Notes (Signed)
Patient needs 3rd and final Twinrix: Hep A and Hep B vaccine to complete series started in May 2022.  Patient needed the 3rd shot in November 2022 (6 months from the first).  We can no longer get Twinrix so patient will need 1 Hep A and 1 Hep B. He will come in on Friday, 07-19-21 at 9:00am

## 2021-07-15 NOTE — Telephone Encounter (Signed)
Patient needs 3rd and final Twinrix: Hep A and Hep B vaccine to complete series started in May 2022.  Patient needed the 3rd shot on June 26, 2021 (6 months from the first).  We can no longer get Twinrix so patient will need 1 Hep A and 1 Hep B. He will come in on Friday, 07-19-21 at 9:00am

## 2021-07-19 ENCOUNTER — Ambulatory Visit (INDEPENDENT_AMBULATORY_CARE_PROVIDER_SITE_OTHER): Payer: Medicare Other | Admitting: Gastroenterology

## 2021-07-19 DIAGNOSIS — Z23 Encounter for immunization: Secondary | ICD-10-CM

## 2021-07-25 ENCOUNTER — Ambulatory Visit: Payer: Medicare Other | Admitting: Gastroenterology

## 2021-08-01 ENCOUNTER — Telehealth: Payer: Self-pay | Admitting: Gastroenterology

## 2021-08-01 NOTE — Telephone Encounter (Signed)
Patients wife called stated that she wanted to inform you on some Dr. Thomasene Lot that patient had been to in the last couple of months just in case [place] needed to know. Please advise.

## 2021-08-01 NOTE — Telephone Encounter (Signed)
Thanks for the note. I will keep an eye out for the records to review for his upcoming appointment.

## 2021-08-01 NOTE — Telephone Encounter (Signed)
Called and spoke with patient and his wife. They wanted to inform Dr. Havery Moros that pt saw his PCP on 06/17/21 (records in care everywhere). They reported that patient has seen Dr. Candiss Norse at [facility] as well and had labs drawn. Pt also had a CT scan on 06/23/21, those results are available in epic. They wanted to make sure you had all of the updated information prior to pt's appt on 1/6.  I called and spoke with Lattie Haw at [facility], she is faxing over the latest records for pt. This information will be placed in your IN box for review when you return to the office. Thanks

## 2021-08-02 NOTE — Progress Notes (Signed)
Patient needs follow-up CT chest abdomen pelvis without contrast for continued follow-up for his metastatic renal cell carcinoma.  IV dye cannot be used due to presence of single kidney with chronic kidney disease.

## 2021-08-09 ENCOUNTER — Encounter: Payer: Self-pay | Admitting: Gastroenterology

## 2021-08-09 ENCOUNTER — Ambulatory Visit: Payer: Medicare Other | Admitting: Gastroenterology

## 2021-08-09 VITALS — BP 172/94 | HR 48 | Ht 69.0 in | Wt 178.1 lb

## 2021-08-09 DIAGNOSIS — I8500 Esophageal varices without bleeding: Secondary | ICD-10-CM | POA: Diagnosis not present

## 2021-08-09 DIAGNOSIS — R748 Abnormal levels of other serum enzymes: Secondary | ICD-10-CM | POA: Diagnosis not present

## 2021-08-09 DIAGNOSIS — C649 Malignant neoplasm of unspecified kidney, except renal pelvis: Secondary | ICD-10-CM

## 2021-08-09 NOTE — Patient Instructions (Addendum)
If you are age 72 or older, your body mass index should be between 23-30. Your Body mass index is 26.3 kg/m. If this is out of the aforementioned range listed, please consider follow up with your Primary Care Provider.  If you are age 61 or younger, your body mass index should be between 19-25. Your Body mass index is 26.3 kg/m. If this is out of the aformentioned range listed, please consider follow up with your Primary Care Provider.   ________________________________________________________  The [place] [facility] providers would like to encourage you to use [facility] to communicate with providers for non-urgent requests or questions.  Due to long hold times on the telephone, sending your provider a message by [facility] may be a faster and more efficient way to get a response.  Please allow 48 business hours for a response.  Please remember that this is for non-urgent requests.  _______________________________________________________  We will refer you to IR for Transjugular Liver Biopsy.  They will be in contact you to schedule the procedure and instruct for preparations.  Thank you for entrusting me with your care and for choosing [facility], Dr. [place] Cellar

## 2021-08-09 NOTE — Progress Notes (Signed)
HPI :  71 year old male here for reassessment of abnormal liver enzymes. Recall he has a history of metastatic renal cell carcinoma, history of Whipple / gastric resection, diabetes.  See prior notes for full history. He has an extensive medical history to include renal cell carcinoma diagnosed over 20 years ago.  He had a right nephrectomy initially, years later had recurrence of disease in his abdomen including his pancreas, spleen and small intestine.  He had a resection of his pancreas, Whipple procedure, with part of his stomach and intestine/spleen removed.  Also cholecystectomy.  He is also had recurrence in his thyroid that led to thyroidectomy.  He has been on chemotherapy with Dr. Irene Limbo, namely Sunitinib for the past 5 to 6 years.  He had been off this drug for the past several months however due to elevated liver enzymes.   Previously he was complaining of left upper side abdominal pain that led to a work-up with CT scan, EGD, colonoscopy without clear cause.  Empiric PPI resolve the symptoms for him and he has not had any recurrence since.  He continues to take the PPI as he feels well on it.  Recall that EGD incidentally showed some very small esophageal varices.  He had some postsurgical anatomy appreciated with some erythema in his stomach.  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  Colonoscopy showed a few polyps in his colon but there was no abnormality in the right colon that was previously seen on a CT scan (previously had right-sided colonic wall thickening). The small bowel fistula was not seen on prior CT scan.  He underwent an upper GI series which also did not show any evidence of a fistula.   Otherwise recall that the patient has had elevation in his liver enzymes since November 2021 or so.  Alkaline phosphatase has ranged from 130s to 200s, ALT 50s to 70s, AST 40s to 50s.  Total bilirubin normal.  He has an  occasional beer but no routine alcohol use.  It was thought that this initially was related to sunitinib use, and it was held but his liver enzymes have unfortunately persisted to this degree of elevation.  He recently had a another CT scan of his chest abdomen pelvis which showed no abnormalities in his liver, although the exam was without IV contrast.  He has no evidence of cirrhosis.  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  At her last visit he wanted to hold off on liver biopsy and monitor.  His liver enzymes remain about the same.  He was here to discuss possible liver biopsy.   Prior work-up: Echocardiogram 03/20/20 - EF 60-65%, moderate LVH,    Colonoscopy 04/12/2014 - Dr. Deatra Ina -  good prep, normal exam    EGD 12/19/20 -  - Suspected small varices were found in the middle third of the esophagus and in the lower third of the esophagus. They were small in size without any high risk stigmata for bleeding. - The exam of the esophagus was otherwise normal. - Evidence of a gastroenterostomy was found in the gastric body. Anastomosis was characterized by healthy appearing mucosa. - Diffuse erythematous mucosa was found in the gastric pouch. Biopsies were taken with a cold forceps for Helicobacter pylori testing. - The exam of the stomach remnant was  otherwise normal. Gastric remnant is small. 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 I could not traverse it with the upper endoscope but could see through it and it appeared to be normal appearing bowel. - Exam of the small bowel was otherwise normal, it was deeply intubated.   Colonoscopy 12/19/20 - The perianal and digital rectal examinations were normal. - The terminal ileum appeared normal. - The mucosa of the cecum was slightly altered with textural change. Clear borders not identified. I suspect normal variant  but biopsies were taken with a cold forceps for histology to ensure no flat adenoma. - Two sessile polyps were found in the transverse colon. The polyps were 3 to 5 mm in size. These polyps were removed with a cold snare. Resection and retrieval were complete. - A 4 mm polyp was found in the descending colon. The polyp was sessile. The polyp was removed with a cold snare. Resection and retrieval were complete. - Four sessile polyps were found in the sigmoid colon. The polyps were 4 to 5 mm in size. These polyps were removed with a cold snare. Resection and retrieval were complete. - Internal hemorrhoids were found during retroflexion. - The colon was spastic and tortous. The exam was otherwise without abnormality.    1. Surgical [P], gastric pouch bxs - MILD CHRONIC GASTRITIS. - WARTHIN-STARRY STAIN IS NEGATIVE FOR HELICOBACTER PYLORI. 2. Surgical [P], colon, transverse x2, sigmoid x3, descending x1, polyp (6) - TUBULAR ADENOMA (X MULTIPLE). - NEGATIVE FOR HIGH GRADE DYSPLASIA. 3. Surgical [P], colon, cecum (atypical mucosa) biopsies - COLONIC MUCOSA WITH EDEMA. - NEGATIVE FOR DYSPLASIA.   UGI series 01/14/21 - IMPRESSION: Gastric fold thickening, also present on the prior CT of 12/24/2020. Findings are nonspecific, but may reflect gastritis. Correlate with findings on recent biopsies. The terminal ileum is somewhat poorly delineated due to overlapping small bowel loops (despite compression). Within this limitation, otherwise unremarkable upper GI series and small-bowel follow-through in this patient status post remote Whipple procedure. No definite small bowel fistula is identified  CT chest / abdomen / pelvis without contrast 06/14/21: Hepatobiliary: Unenhanced liver is unremarkable. Calcified granuloma in the posterior right hepatic lobe. Status post cholecystectomy. No intrahepatic or extrahepatic ductal dilatation. Pancreas: Surgically absent. Spleen: Surgically  absent.  IMPRESSION: Status post right nephrectomy. No evidence of recurrent or metastatic disease.   Radiation changes in the right lung. Associated pathologic fracture of the right 4th rib.   Postsurgical changes related to Whipple procedure with pancreatectomy and splenectomy.   Mild superior endplate changes at L3, new/progressive. No retropulsion.   Additional ancillary findings as above.   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   Sero Negative, Ultrasound positive synovitis    Kidney disease    Macular degeneration    Bilateral eyes   Nephrolithiasis    Osteoarthritis of both hands 05/28/2016   Osteoarthritis of both knees 05/28/2016   Peripheral vascular disorder ([place]) 05/28/2016   Renal  calcinosis 05/28/2016   Rheumatoid arthritis ([place])    Thyroid cancer ([place]) 05/28/2016     Past Surgical History:  Procedure Laterality Date   CATARACT EXTRACTION Bilateral 2013   CHOLECYSTECTOMY     GASTRECTOMY     tumor removed   GLAUCOMA SURGERY     Laser surgery   LIPOMA EXCISION Right 1999   NEPHRECTOMY Right    PANCREATECTOMY     SPLENECTOMY     THYROIDECTOMY     URETERAL STENT PLACEMENT Left 2012   WHIPPLE PROCEDURE     Family History  Problem Relation Age of Onset   Bleeding Disorder Mother        Unknown what type   Osteoporosis Mother    Heart disease Mother    Diabetes Mother    Hypertension Mother    Lymphoma Father        Hodgkin's lymphoma   Other Daughter        Brain stent   Colon cancer Neg Hx     Social History   Tobacco Use   Smoking status: Former    Years: 1.50    Types: Cigarettes   Smokeless tobacco: Never  Vaping Use   Vaping Use: Never used  Substance Use Topics   Alcohol use: Yes    Comment: occasional beer   Drug use: No   Current Outpatient Medications  Medication Sig Dispense Refill   aspirin 81 MG tablet Take 81 mg by mouth daily.      atenolol (TENORMIN) 100 MG tablet Take 100 mg by mouth daily.      augmented betamethasone dipropionate (DIPROLENE-AF) 0.05 % cream Apply 1 application topically daily as needed (rash).   0   azelastine (OPTIVAR) 0.05 % ophthalmic solution Apply 1 drop to eye in the morning and at bedtime. As needed     calcium citrate-vitamin D (CITRACAL+D) 315-200 MG-UNIT per tablet Take 2 tablets by mouth 2 (two) times daily.      Cholecalciferol (VITAMIN D3) 50 MCG (2000 UT) TABS Take 1 tablet by mouth daily.     Cyanocobalamin (VITAMIN B-12) 2500 MCG SUBL Take 2,500 mcg by mouth daily.      diclofenac Sodium (VOLTAREN) 1 % GEL Apply 3 g topically 3 (three) times daily as needed (joint pain).      dicyclomine (BENTYL) 10 MG capsule Take 1 capsule (10 mg total) by mouth every 8 (eight) hours as needed for spasms. 30 capsule 1   docusate sodium (COLACE) 100 MG capsule Take 100 mg by mouth 2 (two) times daily as needed for mild constipation.      dorzolamide-timolol (COSOPT) 22.3-6.8 MG/ML ophthalmic solution Place 1 drop into both eyes 2 (two) times daily.   1   famotidine (PEPCID) 20 MG tablet Take 20 mg by mouth 3 (three) times daily.     gabapentin (NEURONTIN) 300 MG capsule Take 300 mg by mouth as needed.     Glucosamine-MSM-Hyaluronic Acd 750-375-30 MG TABS Take 2 tablets by mouth daily.     hydrALAZINE (APRESOLINE) 25 MG tablet Take 25 mg by mouth 3 (three) times daily.     HYDROcodone-acetaminophen (NORCO/VICODIN) 5-325 MG tablet Take 1 tablet by mouth every 6 (six) hours as needed.     insulin lispro (HUMALOG) 100 UNIT/ML injection  Inject 2-4 Units into the skin 3 (three) times daily before meals. Per sliding scale     Lactobacillus (ACIDOPHILUS) CAPS capsule Take 1 capsule by mouth daily.     latanoprost (XALATAN) 0.005 %  ophthalmic solution Place 1 drop into both eyes at bedtime.   1   LEVEMIR 100 UNIT/ML injection Inject 7 Units into the skin 2 (two) times daily.  1   levothyroxine (SYNTHROID) 200 MCG tablet Take 200 mcg by mouth daily before breakfast.     levothyroxine (SYNTHROID) 50 MCG tablet Take 50 mcg by mouth daily before breakfast.     losartan (COZAAR) 25 MG tablet Take 25 mg by mouth daily.     lovastatin (MEVACOR) 40 MG tablet Take 40 mg by mouth at bedtime.      Multiple Vitamins-Minerals (ICAPS) TABS Take 1 tablet by mouth 2 (two) times daily.     Omega-3 Fatty Acids (FISH OIL) 1200 MG CAPS Take 1 capsule by mouth daily.     omeprazole (PRILOSEC) 20 MG capsule Take 1 capsule (20 mg total) by mouth daily. 30 capsule 3   Pancrelipase, Lip-Prot-Amyl, 6000-19000 units CPEP Take 4 capsules by mouth 3 (three) times daily.     sildenafil (VIAGRA) 100 MG tablet Take 100 mg by mouth daily as needed.     torsemide (DEMADEX) 20 MG tablet Take 20 mg by mouth 2 (two) times daily.     traMADol (ULTRAM) 50 MG tablet Take 50 mg by mouth 2 (two) times daily as needed.     No current facility-administered medications for this visit.   Allergies  Allergen Reactions   Iodine    Prozac [Fluoxetine Hcl]     Other reaction(s): diarrhea     Review of Systems: All systems reviewed and negative except where noted in HPI.   Labs per HPI  Physical Exam: BP (!) 172/94 (BP Location: Left Arm, Patient Position: Sitting, Cuff Size: Normal)    Pulse (!) 48    Ht 5\' 9"  (1.753 m) Comment: height measured without shoes   Wt 178 lb 2 oz (80.8 kg)    BMI 26.30 kg/m  Constitutional: Pleasant,well-developed, male in no acute distress. Neurological: Alert and oriented to person place and time. Psychiatric: Normal mood and affect.  Behavior is normal.   ASSESSMENT AND PLAN: 71 year old male here for reassessment of following:  Elevated liver enzymes Esophageal varices History of renal cell carcinoma  Initially as above it was thought that his elevated liver enzymes could have been due to his sunitinib use which is not uncommon.  However despite stopping this his liver enzyme elevation has persisted.  His imaging on CT scan has not shown any abnormality of his liver to account for this.  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  Serologic evaluation for this has otherwise been unremarkable.  We reviewed his medication list.  He is taking hydralazine which can cause drug-induced liver injury however this was only started within the past month or so.  He does not take any other obvious medicine to cause a DILI routine basis.  We discussed options.  He does not have any obvious cirrhosis on his imaging although it appeared to have esophageal varices on his EGD, raising question of noncirrhotic portal hypertension.  His liver enzymes have persisted, the only way to really sort this out at this point time would be with a liver biopsy.  I discussed what liver biopsy is and ways to do this, and associated risks.  I think a transjugular liver biopsy would be able to clarify if he has portal hypertension or not, potentially clarify etiology of  his elevated liver enzymes, and to determine if he has developed any fibrotic change from this and to ensure no cirrhosis.  Main risk is that of bleeding.  After discussion of these issues with the patient and his wife, he wants to proceed with a transjugular liver biopsy.  I will refer him to interventional radiology for this and contact him with the results.  If he has evidence of iron overload we will need to discuss this with Dr. Irene Limbo his oncologist  Jolly Mango, MD [facility]

## 2021-08-16 ENCOUNTER — Telehealth: Payer: Self-pay | Admitting: Rheumatology

## 2021-08-16 NOTE — Telephone Encounter (Signed)
Patients wife called the office stating the patient had a pelvis and abdominal CT w/contrast on November 10th and the patient requests that those be reviewed by Dr. Estanislado Pandy before his next appointment.

## 2021-08-16 NOTE — Telephone Encounter (Signed)
Last visit: 09/05/2020 Next visit: 09/13/2021  Dx: Inflammatory arthritis

## 2021-08-21 ENCOUNTER — Other Ambulatory Visit: Payer: Self-pay | Admitting: Radiology

## 2021-08-22 ENCOUNTER — Other Ambulatory Visit: Payer: Self-pay | Admitting: Gastroenterology

## 2021-08-22 ENCOUNTER — Other Ambulatory Visit: Payer: Self-pay

## 2021-08-22 ENCOUNTER — Ambulatory Visit (HOSPITAL_COMMUNITY)
Admission: RE | Admit: 2021-08-22 | Discharge: 2021-08-22 | Disposition: A | Discharge status: Home / Self Care | Payer: Medicare Other | Source: Ambulatory Visit | Attending: Gastroenterology | Admitting: Gastroenterology

## 2021-08-22 ENCOUNTER — Encounter (HOSPITAL_COMMUNITY): Payer: Self-pay

## 2021-08-22 DIAGNOSIS — Z905 Acquired absence of kidney: Secondary | ICD-10-CM | POA: Insufficient documentation

## 2021-08-22 DIAGNOSIS — R748 Abnormal levels of other serum enzymes: Secondary | ICD-10-CM

## 2021-08-22 DIAGNOSIS — M069 Rheumatoid arthritis, unspecified: Secondary | ICD-10-CM | POA: Diagnosis not present

## 2021-08-22 DIAGNOSIS — C649 Malignant neoplasm of unspecified kidney, except renal pelvis: Secondary | ICD-10-CM

## 2021-08-22 DIAGNOSIS — E1122 Type 2 diabetes mellitus with diabetic chronic kidney disease: Secondary | ICD-10-CM | POA: Diagnosis not present

## 2021-08-22 DIAGNOSIS — I129 Hypertensive chronic kidney disease with stage 1 through stage 4 chronic kidney disease, or unspecified chronic kidney disease: Secondary | ICD-10-CM | POA: Diagnosis not present

## 2021-08-22 DIAGNOSIS — N189 Chronic kidney disease, unspecified: Secondary | ICD-10-CM | POA: Insufficient documentation

## 2021-08-22 DIAGNOSIS — I8500 Esophageal varices without bleeding: Secondary | ICD-10-CM

## 2021-08-22 DIAGNOSIS — E785 Hyperlipidemia, unspecified: Secondary | ICD-10-CM | POA: Diagnosis not present

## 2021-08-22 DIAGNOSIS — Z85528 Personal history of other malignant neoplasm of kidney: Secondary | ICD-10-CM | POA: Diagnosis not present

## 2021-08-22 DIAGNOSIS — E1151 Type 2 diabetes mellitus with diabetic peripheral angiopathy without gangrene: Secondary | ICD-10-CM | POA: Diagnosis not present

## 2021-08-22 DIAGNOSIS — K219 Gastro-esophageal reflux disease without esophagitis: Secondary | ICD-10-CM | POA: Insufficient documentation

## 2021-08-22 HISTORY — PX: IR VENOGRAM HEPATIC W HEMODYNAMIC EVALUATION: IMG692

## 2021-08-22 HISTORY — PX: IR US GUIDE VASC ACCESS RIGHT: IMG2390

## 2021-08-22 LAB — CBC WITH DIFFERENTIAL/PLATELET
Abs Immature Granulocytes: 0.02 10*3/uL (ref 0.00–0.07)
Basophils Absolute: 0.1 10*3/uL (ref 0.0–0.1)
Basophils Relative: 1 %
Eosinophils Absolute: 1.1 10*3/uL — ABNORMAL HIGH (ref 0.0–0.5)
Eosinophils Relative: 12 %
HCT: 40 % (ref 39.0–52.0)
Hemoglobin: 13 g/dL (ref 13.0–17.0)
Immature Granulocytes: 0 %
Lymphocytes Relative: 25 %
Lymphs Abs: 2.3 10*3/uL (ref 0.7–4.0)
MCH: 34.7 pg — ABNORMAL HIGH (ref 26.0–34.0)
MCHC: 32.5 g/dL (ref 30.0–36.0)
MCV: 106.7 fL — ABNORMAL HIGH (ref 80.0–100.0)
Monocytes Absolute: 1.1 10*3/uL — ABNORMAL HIGH (ref 0.1–1.0)
Monocytes Relative: 13 %
Neutro Abs: 4.4 10*3/uL (ref 1.7–7.7)
Neutrophils Relative %: 49 %
Platelets: 231 10*3/uL (ref 150–400)
RBC: 3.75 MIL/uL — ABNORMAL LOW (ref 4.22–5.81)
RDW: 13.4 % (ref 11.5–15.5)
WBC: 9 10*3/uL (ref 4.0–10.5)
nRBC: 0 % (ref 0.0–0.2)

## 2021-08-22 LAB — COMPREHENSIVE METABOLIC PANEL
ALT: 58 U/L — ABNORMAL HIGH (ref 0–44)
AST: 71 U/L — ABNORMAL HIGH (ref 15–41)
Albumin: 2.7 g/dL — ABNORMAL LOW (ref 3.5–5.0)
Alkaline Phosphatase: 140 U/L — ABNORMAL HIGH (ref 38–126)
Anion gap: 9 (ref 5–15)
BUN: 29 mg/dL — ABNORMAL HIGH (ref 8–23)
CO2: 22 mmol/L (ref 22–32)
Calcium: 7.6 mg/dL — ABNORMAL LOW (ref 8.9–10.3)
Chloride: 107 mmol/L (ref 98–111)
Creatinine, Ser: 2.61 mg/dL — ABNORMAL HIGH (ref 0.61–1.24)
GFR, Estimated: 25 mL/min — ABNORMAL LOW (ref >60)
Glucose, Bld: 201 mg/dL — ABNORMAL HIGH (ref 70–99)
Potassium: 4.6 mmol/L (ref 3.5–5.1)
Sodium: 138 mmol/L (ref 135–145)
Total Bilirubin: 1.1 mg/dL (ref 0.3–1.2)
Total Protein: 5.3 g/dL — ABNORMAL LOW (ref 6.5–8.1)

## 2021-08-22 LAB — GLUCOSE, CAPILLARY: Glucose-Capillary: 170 mg/dL — ABNORMAL HIGH (ref 70–99)

## 2021-08-22 LAB — PROTIME-INR
INR: 0.9 (ref 0.8–1.2)
Prothrombin Time: 12.2 seconds (ref 11.4–15.2)

## 2021-08-22 MED ORDER — FENTANYL CITRATE (PF) 100 MCG/2ML IJ SOLN
INTRAMUSCULAR | Status: AC
  Filled 2021-08-22: qty 2

## 2021-08-22 MED ORDER — SODIUM CHLORIDE 0.9 % IV SOLN: INTRAVENOUS | Status: DC

## 2021-08-22 MED ORDER — MIDAZOLAM HCL 2 MG/2ML IJ SOLN
INTRAMUSCULAR | Status: AC | PRN
Start: 2021-08-22 — End: 2021-08-22
  Administered 2021-08-22 (×2): 1 mg via INTRAVENOUS

## 2021-08-22 MED ORDER — LIDOCAINE HCL 1 % IJ SOLN
INTRAMUSCULAR | Status: AC
  Administered 2021-08-22: 5 mL
  Filled 2021-08-22: qty 20

## 2021-08-22 MED ORDER — MIDAZOLAM HCL 2 MG/2ML IJ SOLN
INTRAMUSCULAR | Status: AC
  Filled 2021-08-22: qty 4

## 2021-08-22 MED ORDER — FENTANYL CITRATE (PF) 100 MCG/2ML IJ SOLN
INTRAMUSCULAR | Status: AC | PRN
  Administered 2021-08-22 (×2): 50 ug via INTRAVENOUS

## 2021-08-22 NOTE — Procedures (Signed)
Interventional Radiology Procedure:   Indications: Elevated liver enzymes and concern for portal hypertension  Procedure: Transjugular venogram with pressures  Findings: Multiple attempts made to get accurate wedged and free hepatic pressures but pressure waveforms were poor and pressures were not reliable.  Technically challenging exam due to anatomy and inability to use iodinated contrast due to kidney disease.  Unable to perform transjugular biopsy because of difficulty getting adequate hepatic vein access.   Complications: No immediate complications noted.     EBL: Minimal  Plan: Bedrest 2 hours and then discharge to home.  Will discuss with Dr. Havery Moros about re-scheduling another TJ biopsy with pressures vs. Percutaneous liver biopsy only.    Dylan Jean R. Anselm Pancoast, MD  Pager: [phone]

## 2021-08-22 NOTE — H&P (Signed)
Referring Physician(s): Armbruster,Steven P  Supervising Physician: Markus Daft  Patient Status:  [facility] OP  Chief Complaint:  "I'm having a liver biopsy"  Subjective: Patient familiar to IR service from right lung nodule biopsy in 2016.  He has a significant past medical history including metastatic right renal cell carcinoma with prior right nephrectomy, Whipple (gastric resection, splenectomy , pancreatectomy ), cholecystectomy, diabetes, thyroidectomy due to metastatic disease, renal insufficiency, depression, hyperlipidemia, hypertension, hypothyroidism, for lithiasis, osteoarthritis, peripheral vascular disease, rheumatoid arthritis, GERD, colon polyps, esophageal varices.  He now presents with persistently elevated liver function tests in addition  to high ferritin with high iron saturation.  He presents today for image guided transjugular liver biopsy with hepatic/portal venous pressure measurements to exclude cirrhosis/portal hypertension.  Patient did have a calcified granuloma in the posterior right hepatic lobe on recent CT.  He denies fever, headache, chest pain, dyspnea, cough, abdominal/back pain, nausea, vomiting or bleeding.  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   Sero Negative, Ultrasound positive synovitis    Kidney disease    Macular degeneration    Bilateral eyes   Nephrolithiasis    Osteoarthritis of both hands 05/28/2016   Osteoarthritis of both knees 05/28/2016   Peripheral vascular disorder ([place]) 05/28/2016   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   Rheumatoid arthritis ([place])    Thyroid cancer ([place]) 05/28/2016   Past Surgical History:  Procedure Laterality Date   CATARACT  EXTRACTION Bilateral 2013   CHOLECYSTECTOMY     GASTRECTOMY     tumor removed   GLAUCOMA SURGERY     Laser surgery   LIPOMA EXCISION Right 1999   NEPHRECTOMY Right    PANCREATECTOMY     SPLENECTOMY     THYROIDECTOMY     URETERAL STENT PLACEMENT Left 2012   WHIPPLE PROCEDURE        Allergies: Iodine and Prozac [fluoxetine hcl]  Medications: Prior to Admission medications   Medication Sig Start Date End Date Taking? Authorizing Provider  atenolol (TENORMIN) 100 MG tablet Take 100 mg by mouth daily.  01/16/14  Yes [provider]  augmented betamethasone dipropionate (DIPROLENE-AF) 0.05 % cream Apply 1 application topically daily as needed (rash).  03/05/15  Yes [provider]  azelastine (OPTIVAR) 0.05 % ophthalmic solution Apply 1 drop to eye in the morning and at bedtime. As needed 12/11/20  Yes [provider]  calcium citrate-vitamin D (CITRACAL+D) 315-200 MG-UNIT per tablet Take 2 tablets by mouth 2 (two) times daily.    Yes [provider]  Cholecalciferol (VITAMIN D3) 50 MCG (2000 UT) TABS Take 1 tablet by mouth daily.   Yes [provider]  Cyanocobalamin (VITAMIN B-12) 2500 MCG SUBL Take 2,500 mcg by mouth daily.    Yes [provider]  diclofenac Sodium (VOLTAREN) 1 % GEL Apply 3 g topically 3 (three) times daily as needed (joint pain).  02/09/20  Yes [provider]  dicyclomine (BENTYL) 10 MG capsule Take 1 capsule (10 mg total) by mouth every 8 (eight) hours as needed for spasms. 12/13/20  Yes Armbruster, Carlota Raspberry, MD  docusate sodium (COLACE) 100 MG capsule Take 100 mg by mouth 2 (two) times daily  as needed for mild constipation.    Yes [provider]  dorzolamide-timolol (COSOPT) 22.3-6.8 MG/ML ophthalmic solution Place 1 drop into both eyes 2 (two) times daily.  11/08/15  Yes [provider]  famotidine (PEPCID) 20 MG tablet Take 20 mg by mouth 3 (three) times daily.   Yes [provider]   hydrALAZINE (APRESOLINE) 25 MG tablet Take 25 mg by mouth 3 (three) times daily. 04/25/21  Yes [provider]  Lactobacillus (ACIDOPHILUS) CAPS capsule Take 1 capsule by mouth daily.   Yes [provider]  latanoprost (XALATAN) 0.005 % ophthalmic solution Place 1 drop into both eyes at bedtime.  11/08/15  Yes [provider]  LEVEMIR 100 UNIT/ML injection Inject 7 Units into the skin 2 (two) times daily. 03/05/15  Yes [provider]  levothyroxine (SYNTHROID) 200 MCG tablet Take 200 mcg by mouth daily before breakfast. 11/02/20  Yes [provider]  levothyroxine (SYNTHROID) 50 MCG tablet Take 50 mcg by mouth daily before breakfast.   Yes [provider]  losartan (COZAAR) 25 MG tablet Take 25 mg by mouth daily. 08/28/20  Yes [provider]  lovastatin (MEVACOR) 40 MG tablet Take 40 mg by mouth at bedtime.  01/18/14  Yes [provider]  Multiple Vitamins-Minerals (ICAPS) TABS Take 1 tablet by mouth 2 (two) times daily. 11/23/19  Yes [provider]  Omega-3 Fatty Acids (FISH OIL) 1200 MG CAPS Take 1 capsule by mouth daily.   Yes [provider]  omeprazole (PRILOSEC) 20 MG capsule Take 1 capsule (20 mg total) by mouth daily. 12/19/20  Yes Armbruster, Carlota Raspberry, MD  Pancrelipase, Lip-Prot-Amyl, 6000-19000 units CPEP Take 4 capsules by mouth 3 (three) times daily.   Yes [provider]  sildenafil (VIAGRA) 100 MG tablet Take 100 mg by mouth daily as needed. 06/02/21  Yes [provider]  torsemide (DEMADEX) 20 MG tablet Take 20 mg by mouth 2 (two) times daily. 10/02/20  Yes [provider]  aspirin 81 MG tablet Take 81 mg by mouth daily.     [provider]  gabapentin (NEURONTIN) 300 MG capsule Take 300 mg by mouth as needed. 05/06/20   [provider]  Glucosamine-MSM-Hyaluronic Acd 750-375-30 MG TABS Take 2 tablets by mouth daily.    [provider]   HYDROcodone-acetaminophen (NORCO/VICODIN) 5-325 MG tablet Take 1 tablet by mouth every 6 (six) hours as needed. 10/15/20   [provider]  insulin lispro (HUMALOG) 100 UNIT/ML injection Inject 2-4 Units into the skin 3 (three) times daily before meals. Per sliding scale    [provider]  traMADol (ULTRAM) 50 MG tablet Take 50 mg by mouth 2 (two) times daily as needed. 03/30/20   [provider]     Vital Signs: BP (!) 161/95    Pulse (!) 42    Temp 97.7 F (36.5 C) (Oral)    Resp 16    SpO2 98%   Physical Exam awake, alert.  Chest-clear to auscultation bilaterally.  Heart with bradycardic but regular rhythm.  Abdomen soft, positive bowel sounds, nontender.  Trace pretibial edema bilaterally  Imaging: No results found.  Labs:  CBC: Recent Labs    12/12/20 0844 03/28/21 0939 06/12/21 0944 08/22/21 0930  WBC 9.0 7.4 6.0 9.0  HGB 13.4 14.2 13.3 13.0  HCT 40.7 42.7 40.5 40.0  PLT 233 209 187 231    COAGS: Recent Labs    08/22/21 0930  INR 0.9    BMP:  Recent Labs    12/12/20 0844 03/28/21 0939 06/12/21 0944 08/22/21 0930  NA 141 138 138 138  K 4.0 4.8 3.7 4.6  CL 109 105 107 107  CO2 24 27 24 22   GLUCOSE 235* 244* 213* 201*  BUN 29* 26* 36* 29*  CALCIUM 8.2* 8.2* 7.7* 7.6*  CREATININE 1.93* 2.32* 2.61* 2.61*  GFRNONAA 37* 29* 25* 25*    LIVER FUNCTION TESTS: Recent Labs    01/14/21 0958 03/28/21 0939 06/12/21 0944 08/22/21 0930  BILITOT 0.7 0.8 0.6 1.1  AST 44* 45* 62* 71*  ALT 51 41 62* 58*  ALKPHOS 186* 181* 197* 140*  PROT 5.1* 5.1* 5.0* 5.3*  ALBUMIN 2.6* 2.3* 2.3* 2.7*    Assessment and Plan: Patient familiar to IR service from right lung nodule biopsy in 2016.  He has a significant past medical history including metastatic right renal cell carcinoma with prior right nephrectomy, Whipple (gastric resection, splenectomy , pancreatectomy ), cholecystectomy, diabetes, thyroidectomy due to metastatic disease, renal  insufficiency, depression, hyperlipidemia, hypertension, hypothyroidism, for lithiasis, osteoarthritis, peripheral vascular disease, rheumatoid arthritis, GERD, colon polyps, esophageal varices.  He now presents with persistently elevated liver function tests in addition  to high ferritin with high iron saturation.  He presents today for image guided transjugular liver biopsy with hepatic/portal venous pressure measurements to exclude cirrhosis/portal hypertension.  Patient did have a calcified granuloma in the posterior right hepatic lobe on recent CT. Risks and benefits of procedure was discussed with the patient  including, but not limited to bleeding, infection, damage to adjacent structures or low yield requiring additional tests.  All of the questions were answered and there is agreement to proceed.  Consent signed and in chart.    Electronically Signed: D. Rowe Robert, PA-C 08/22/2021, 10:17 AM   I spent a total of 25 Minutes at the the patient's bedside AND on the patient's hospital floor or unit, greater than 50% of which was counseling/coordinating care for image guided transjugular liver biopsy with hepatic/portal venous pressure measurements

## 2021-08-22 NOTE — Discharge Instructions (Signed)
Please call [facility] clinic [phone] with any questions or concerns.  You may remove your dressing and shower tomorrow.  Moderate Conscious Sedation, Adult, Care After This sheet gives you information about how to care for yourself after your procedure. Your health care provider may also give you more specific instructions. If you have problems or questions, contact your health care provider. What can I expect after the procedure? After the procedure, it is common to have: Sleepiness for several hours. Impaired judgment for several hours. Difficulty with balance. Vomiting if you eat too soon. Follow these instructions at home: For the time period you were told by your health care provider: Rest. Do not participate in activities where you could fall or become injured. Do not drive or use machinery. Do not drink alcohol. Do not take sleeping pills or medicines that cause drowsiness. Do not make important decisions or sign legal documents. Do not take care of children on your own.      Eating and drinking Follow the diet recommended by your health care provider. Drink enough fluid to keep your urine pale yellow. If you vomit: Drink water, juice, or soup when you can drink without vomiting. Make sure you have little or no nausea before eating solid foods.   General instructions Take over-the-counter and prescription medicines only as told by your health care provider. Have a responsible adult stay with you for the time you are told. It is important to have someone help care for you until you are awake and alert. Do not smoke. Keep all follow-up visits as told by your health care provider. This is important. Contact a health care provider if: You are still sleepy or having trouble with balance after 24 hours. You feel light-headed. You keep feeling nauseous or you keep vomiting. You develop a rash. You have a fever. You have redness or swelling around the IV  site. Get help right away if: You have trouble breathing. You have new-onset confusion at home. Summary After the procedure, it is common to feel sleepy, have impaired judgment, or feel nauseous if you eat too soon. Rest after you get home. Know the things you should not do after the procedure. Follow the diet recommended by your health care provider and drink enough fluid to keep your urine pale yellow. Get help right away if you have trouble breathing or new-onset confusion at home. This information is not intended to replace advice given to you by your health care provider. Make sure you discuss any questions you have with your health care provider. Document Revised: 11/18/2019 Document Reviewed: 06/16/2019 Elsevier Patient Education  2021 [place], Adult An incision is a cut that a doctor makes in your skin for surgery. Most times, these cuts are closed after surgery. Your cut from surgery may be closed with: Stitches (sutures). Staples. Skin glue. Skin tape (adhesive) strips. You may need to go back to your doctor to have stitches or staples taken out. This may happen many days or many weeks after your surgery. You need to take good care of your cut so it does not get infected. Follow instructions from your doctor about how to care for your cut. Supplies needed: Soap and water. A clean hand towel. Wound cleanser. A clean bandage (dressing), if needed. Cream or ointment, if told by your doctor. Clean gauze. How to care for your cut from surgery Cleaning your cut Ask your doctor how to clean your cut. You may need to:  Wear medical gloves. Use mild soap and water, or a wound cleanser. Use a clean gauze to pat your cut dry after you clean it. Changing your bandage Wash your hands with soap and water for at least 20 seconds before and after you change your bandage. If you cannot use soap and water, use hand sanitizer. Do not usedisinfectants or antiseptics,  such as rubbing alcohol, to clean your wound unless told by your doctor. Change your bandage as told by your doctor. Leavestitches or skin glue in place for at least 2 weeks. Leave tape strips alone unless you are told to take them off. You may trim the edges of the tape strips if they curl up. Put a cream or ointment on your cut. Do this only as told. Cover your cut with a clean bandage. Ask your doctor when you can leave your cut uncovered. Checking for infection Two stitched incisions. One is normal. The other is red with pus and infected.  Check your cut area every day for signs of infection. Check for: More redness, swelling, or pain. More fluid or blood. New warmth. Hardness or a new rash around the incision. Pus or a bad smell.   Follow these instructions at home Medicines Take over-the-counter and prescription medicines only as told by your doctor. If you were prescribed an antibiotic medicine, cream, or ointment, use it as told by your doctor. Do not stop using the antibiotic even if you start to feel better. Eating and drinking Eat foods that have a lot of certain nutrients, such as protein, vitamin A, and vitamin C. These foods help your cut heal. Foods rich in protein include meat, fish, eggs, dairy, beans, nuts, and protein drinks. Foods rich in vitamin A include carrots and dark green, leafy vegetables. Foods rich in vitamin C include citrus fruits, tomatoes, broccoli, and peppers. Drink enough fluid to keep your pee (urine) pale yellow. General instructions A sign showing that a person should not take a bath.   Do not take baths, swim, or use a hot tub. Ask your doctor about taking showers or sponge baths. Limit movement around your cut. This helps with healing. Try not to strain, lift, or exercise for the first 2 weeks, or for as long as told by your doctor. Return to your normal activities as told by your doctor. Ask your doctor what activities are safe for you. Do  not scratch, scrub, or pick at your cut. Keep it covered as told by your doctor. Protect your cut from the sun when you are outside for the first 6 months, or for as long as told by your doctor. Cover up the scar area or put on sunscreen that has an SPF of at least 30. Do not use any products that contain nicotine or tobacco, such as cigarettes, e-cigarettes, and chewing tobacco. These can delay cut healing. If you need help quitting, ask your doctor. Keep all follow-up visits. Contact a doctor if: You have any of these signs of infection around your cut: More redness, swelling, or pain. More fluid or blood. New warmth or hardness. Pus or a bad smell. A new rash. You have a fever. You feel like you may vomit (nauseous). You vomit. You are dizzy. Your stitches, staples, skin glue, or tape strips come undone. Your cut gets bigger. You have a fever. Get help right away if: Your cut bleeds through your bandage, and bleeding does not stop with gentle pressure. Your cut opens up and comes apart. These  symptoms may be an emergency. Do not wait to see if the symptoms will go away. Get medical help right away. Call your local emergency services (911 in the [place] Do not drive yourself to the hospital. Summary Follow instructions from your doctor about how to care for your cut. Wash your hands with soap and water for at least 20 seconds before and after you change your bandage. If you cannot use soap and water, use hand sanitizer. Check your cut area every day for signs of infection. Keep all follow-up visits. This information is not intended to replace advice given to you by your health care provider. Make sure you discuss any questions you have with your health care provider. Document Revised: 10/22/2020 Document Reviewed: 10/22/2020 Elsevier Patient Education  [place].

## 2021-08-26 ENCOUNTER — Telehealth: Payer: Self-pay | Admitting: Gastroenterology

## 2021-08-26 NOTE — Telephone Encounter (Signed)
Spoke with pt and let him know what Dr. Havery Moros had said. Pt stated that he missed a call from Radiology on Friday. Told pt that may have been an attempt to reschedule the biopsy and that they will probably be calling back to reschedule him. Pt verbalized understanding and stated that if he didn't hear from radiology scheduling by Wednesday he would call them.

## 2021-08-26 NOTE — Telephone Encounter (Signed)
Pt called stating that he was unable to have transjugular liver biopsy because they were unable to advance the catheter in his vein. Pt wants to know what will happen now and if there another way he can have the liver biopsy. Pt also stated he wanted to wait 2-3 weeks to schedule a new liver biopsy to give himself time to heal from attempted liver biopsy. Pt feels like he was left in the dark about what to do next and radiology told him to call [facility]. Pt also wanted to let Dr. Havery Moros know that he feels like he is getting better but is still weak.

## 2021-08-26 NOTE — Telephone Encounter (Signed)
Thanks for letting me know. Dr. Anselm Pancoast of IR had called me about his case.  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  They decided to abort his case and come back at a later time to do a percutaneous liver biopsy which they should be able to get a good tissue sample to clarify the liver issue.  I thought Dr. Anselm Pancoast had discussed this with him and I am sorry if he is unclear what the plan was.  They should have rescheduled him for another liver biopsy, and once I have that result I will let him know.  It is very rare that they are not able to complete the transjugular liver biopsy, I am sorry that occurred with his case.

## 2021-08-26 NOTE — Telephone Encounter (Signed)
Inbound call from patient with questions about liver biopsy

## 2021-08-30 NOTE — Progress Notes (Signed)
Office Visit Note  Patient: Dylan Jefferson             Date of Birth: 1950/09/23           MRN: [id number]             PCP: [name], MD Referring: [name], MD Visit Date: 09/13/2021 Occupation: @GUAROCC @  Subjective:  Pain and stiffness in multiple joints  History of Present Illness: HOYT LEANOS is a 71 y.o. male with history of osteoarthritis.  He returns today after his last visit in February 2022.  At the time he presented with history of seronegative chronic inflammatory arthritis.  No synovitis was seen.  He had mostly osteoarthritis on the examination.  He states he continues to have pain and discomfort in his bilateral hands, neck and lower back.  He has not noticed any joint swelling.  He states he has been using Voltaren gel on his hands and also wearing arthritis gloves.  His lower back pain this very bothersome and has been having difficulty sleeping at night.  He has difficulty rotating his head.  He is concerned that elevation is liver functions and renal functions coming from arthritis.  Activities of Daily Living:  Patient reports morning stiffness for 1-2 hours.   Patient Reports nocturnal pain.  Difficulty dressing/grooming: Denies Difficulty climbing stairs: Denies Difficulty getting out of chair: Denies Difficulty using hands for taps, buttons, cutlery, and/or writing: Reports  Review of Systems  Constitutional:  Positive for fatigue.  HENT:  Positive for mouth dryness. Negative for mouth sores and nose dryness.   Eyes:  Positive for dryness. Negative for pain and itching.  Respiratory:  Negative for shortness of breath and difficulty breathing.   Cardiovascular:  Negative for chest pain and palpitations.  Gastrointestinal:  Negative for blood in stool, constipation and diarrhea.  Endocrine: Negative for increased urination.  Genitourinary:  Negative for difficulty urinating.  Musculoskeletal:  Positive for joint pain, joint pain,  joint swelling, myalgias, morning stiffness, muscle tenderness and myalgias.  Skin:  Negative for color change, rash and redness.  Allergic/Immunologic: Negative for susceptible to infections.  Neurological:  Positive for numbness. Negative for dizziness, headaches, memory loss and weakness.  Hematological:  Negative for bruising/bleeding tendency.  Psychiatric/Behavioral:  Positive for sleep disturbance. Negative for confusion.    PMFS History:  Patient Active Problem List   Diagnosis Date Noted   AKI (acute kidney injury) ([place]) 03/21/2020   Congestive heart failure (CHF) ([place]) 03/19/2020   Counseling regarding advanced care planning and goals of care 06/11/2017   Contracture, right elbow 12/12/2016   Positive QuantiFERON-TB Gold test 07/24/2016   History of diabetes mellitus 07/24/2016   History of esophagitis 07/24/2016   Thyroid cancer ([place]) 05/28/2016   Hyperlipidemia 05/28/2016   Inflammatory polyarthritis ([place]) 05/28/2016   DDD (degenerative disc disease), cervical 05/28/2016   DDD (degenerative disc disease), lumbar 05/28/2016   Osteoarthritis of both knees 05/28/2016   Osteoarthritis of both hands 05/28/2016   Peripheral vascular disorder ([place]) 05/28/2016   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   Nodule of finger of left hand 10/11/2015   Lung metastases ([place]) 09/06/2015   Fatigue 08/16/2015   Hyperbilirubinemia 08/16/2015   Renal insufficiency 08/09/2015   Encounter for chemotherapy management 07/16/2015   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 ([place]) 07/12/2015   Personal history of renal cell cancer 03/29/2014   Special screening for malignant neoplasms, colon 03/29/2014   Esophageal varices with bleeding ([place]) 03/29/2014   RENAL CELL  CANCER, METASTATIC 12/04/2007   Hypothyroidism 12/04/2007   Diabetes mellitus ([place]) 12/04/2007   HTN (hypertension) 12/04/2007   ESOPHAGITIS 12/04/2007   HEMOCCULT POSITIVE STOOL 12/04/2007   HEADACHE, CHRONIC 12/04/2007    Past Medical  History:  Diagnosis Date   Arthritis    Cancer ([place])    Renal cell   DDD (degenerative disc disease), cervical 05/28/2016   DDD (degenerative disc disease), lumbar 05/28/2016   Depression    Diabetes ([place])    Hyperlipidemia 05/28/2016   Hypertension    Hypothyroidism    Inflammatory polyarthritis ([place]) 05/28/2016   Sero Negative, Ultrasound positive synovitis    Kidney disease    Macular degeneration    Bilateral eyes   Nephrolithiasis    Osteoarthritis of both hands 05/28/2016   Osteoarthritis of both knees 05/28/2016   Peripheral vascular disorder ([place]) 05/28/2016   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   Rheumatoid arthritis ([place])    Thyroid cancer ([place]) 05/28/2016    Family History  Problem Relation Age of Onset   Bleeding Disorder Mother        Unknown what type   Osteoporosis Mother    Heart disease Mother    Diabetes Mother    Hypertension Mother    Lymphoma Father        Hodgkin's lymphoma   Other Daughter        Brain stent   Colon cancer Neg Hx    Past Surgical History:  Procedure Laterality Date   CATARACT EXTRACTION Bilateral 2013   CHOLECYSTECTOMY     GASTRECTOMY     tumor removed   GLAUCOMA SURGERY     Laser surgery   IR US GUIDE VASC ACCESS RIGHT  08/22/2021   IR VENOGRAM HEPATIC W HEMODYNAMIC EVALUATION  08/22/2021   LIPOMA EXCISION Right 1999   NEPHRECTOMY Right    PANCREATECTOMY     SPLENECTOMY     THYROIDECTOMY     URETERAL STENT PLACEMENT Left 2012   WHIPPLE PROCEDURE     Social History   Social History Narrative   Not on file   Immunization History  Administered Date(s) Administered   Hep A / Hep B 12/24/2020, 01/24/2021   Hepatitis A, Adult 07/19/2021   Hepatitis B, ped/adol 07/19/2021, 07/19/2021   PFIZER(Purple Top)SARS-COV-2 Vaccination 09/18/2019, 10/11/2019, 06/26/2020     Objective: Vital Signs: BP (!) 194/86 (BP Location: Left Arm, Patient Position: Sitting, Cuff Size: Large)    Pulse (!) 54    Ht 5\' 9"  (1.753 m)    Wt 181 lb 9.6  oz (82.4 kg)    BMI 26.82 kg/m    Physical Exam Vitals and nursing note reviewed.  Constitutional:      Appearance: He is well-developed.  HENT:     Head: Normocephalic and atraumatic.  Eyes:     Conjunctiva/sclera: Conjunctivae normal.     Pupils: Pupils are equal, round, and reactive to light.  Cardiovascular:     Rate and Rhythm: Normal rate and regular rhythm.     Heart sounds: Normal heart sounds.  Pulmonary:     Effort: Pulmonary effort is normal.     Breath sounds: Normal breath sounds.  Abdominal:     General: Bowel sounds are normal.     Palpations: Abdomen is soft.  Musculoskeletal:     Cervical back: Normal range of motion and neck supple.  Skin:    General: Skin is warm and dry.     Capillary Refill: Capillary refill takes less than 2 seconds.  Neurological:  Mental Status: He is alert and oriented to person, place, and time.  Psychiatric:        Behavior: Behavior normal.     Musculoskeletal Exam: He had limited painful range of motion of the cervical spine.  He had limited painful range of motion of the lumbar spine.  Shoulder joints, left elbow joints, wrist joints with good range of motion.  Right elbow contracture was noted.  He had bilateral PIP and DIP thickening with no synovitis.  Hip joints, knee joints, ankles, MTPs and PIPs with good range of motion with no synovitis.  CDAI Exam: CDAI Score: -- Patient Global: --; Provider Global: -- Swollen: --; Tender: -- Joint Exam 09/13/2021   No joint exam has been documented for this visit   There is currently no information documented on the homunculus. Go to the Rheumatology activity and complete the homunculus joint exam.  Investigation: No additional findings.  Imaging: IR Venogram Hepatic W Hemodynamic Evaluation  Result Date: 08/22/2021 INDICATION: 71 year old with elevated liver enzymes. Esophageal varices without bleeding. Patient has chronic renal insufficiency with a solitary left kidney.  Patient is also status post Whipple procedure. Request for transjugular liver biopsy with hepatic vein pressures. EXAM: 1. Ultrasound guidance for vascular access 2. Hepatic venography with carbon dioxide 3. Hepatic venous pressures MEDICATIONS: Moderate sedation ANESTHESIA/SEDATION: Moderate (conscious) sedation was employed during this procedure. A total of Versed 2.0mg  and fentanyl 100 mcg was administered intravenously at the order of the provider performing the procedure. Total intra-service moderate sedation time: 74 minutes minutes. Patient's level of consciousness and vital signs were monitored continuously by radiology nurse throughout the procedure under the supervision of the provider performing the procedure. FLUOROSCOPY TIME:  Fluoroscopy Time: 22 minutes, 24 seconds, 696 mGy COMPLICATIONS: None immediate. PROCEDURE: Informed written consent was obtained from the patient after a thorough discussion of the procedural risks, benefits and alternatives. All questions were addressed. Maximal Sterile Barrier Technique was utilized including caps, mask, sterile gowns, sterile gloves, sterile drape, hand hygiene and skin antiseptic. A timeout was performed prior to the initiation of the procedure. Ultrasound confirmed a patent right internal jugular vein. Ultrasound image was saved for documentation. Right neck was prepped and draped in sterile fashion. Skin was anesthetized with 1% lidocaine. Small incision was made. 21 gauge needle was directed into the right internal jugular vein with ultrasound guidance. Micropuncture dilator set was placed. A Kumpe catheter was advanced into the SVC and eventually the IVC using a Bentson wire. It was difficult to access the IVC. Superstiff Amplatz wire was placed and the tract was dilated to accommodate a 10 [place] sheath. A Kumpe catheter was used to cannulate the right hepatic vein and venograms were performed. Due to patient's renal insufficiency, iodinated contrast was  not used for this procedure. Venograms were performed with carbon dioxide. Multiple wedged and free hepatic vein pressures were taken. Wedge position was confirmed using carbon dioxide. However, the waveforms in the hepatic veins were very poor including the free hepatic pressures. These pressures were not felt to be reliable. It was difficult to aspirate and flush the catheter. Catheter became occluded and was completely removed to be flushed. After the catheter was completely removed, unable to successfully cannulate a hepatic vein again. At this point, the procedure was stopped and we were unable to perform the transjugular liver biopsy. The 10 French sheath was removed with manual compression. FINDINGS: Technically challenging to cannulate the hepatic veins. Eventually, 5 French catheter was advanced into the hepatic venous  system and a series of venograms performed using carbon dioxide. Wedged and free hepatic venograms were performed. There was opacification of the portal system during at least 1 of the wedge venograms. Hepatic vein pressures: 1. Free hepatic 25, wedge 13 2. Free hepatic 25, wedge 8 3. Free hepatic 16, wedge 11 IMPRESSION: 1. Technically challenging examination due to patient's anatomy and inability to use iodinated contrast. Series of hepatic venous pressures were obtained but there were poor waveforms and do not feel like these hepatic venous pressures are reliable. 2. Transjugular liver biopsy was not attempted. Procedure findings were discussed with the referring physician, Dr. Havery Moros. Patient will be scheduled for an ultrasound-guided percutaneous random liver biopsy. Electronically Signed   By: Markus Daft M.D.   On: 08/22/2021 18:53   IR US Guide Vasc Access Right  Result Date: 08/22/2021 INDICATION: 71 year old with elevated liver enzymes. Esophageal varices without bleeding. Patient has chronic renal insufficiency with a solitary left kidney. Patient is also status post Whipple  procedure. Request for transjugular liver biopsy with hepatic vein pressures. EXAM: 1. Ultrasound guidance for vascular access 2. Hepatic venography with carbon dioxide 3. Hepatic venous pressures MEDICATIONS: Moderate sedation ANESTHESIA/SEDATION: Moderate (conscious) sedation was employed during this procedure. A total of Versed 2.0mg  and fentanyl 100 mcg was administered intravenously at the order of the provider performing the procedure. Total intra-service moderate sedation time: 74 minutes minutes. Patient's level of consciousness and vital signs were monitored continuously by radiology nurse throughout the procedure under the supervision of the provider performing the procedure. FLUOROSCOPY TIME:  Fluoroscopy Time: 22 minutes, 24 seconds, 284 mGy COMPLICATIONS: None immediate. PROCEDURE: Informed written consent was obtained from the patient after a thorough discussion of the procedural risks, benefits and alternatives. All questions were addressed. Maximal Sterile Barrier Technique was utilized including caps, mask, sterile gowns, sterile gloves, sterile drape, hand hygiene and skin antiseptic. A timeout was performed prior to the initiation of the procedure. Ultrasound confirmed a patent right internal jugular vein. Ultrasound image was saved for documentation. Right neck was prepped and draped in sterile fashion. Skin was anesthetized with 1% lidocaine. Small incision was made. 21 gauge needle was directed into the right internal jugular vein with ultrasound guidance. Micropuncture dilator set was placed. A Kumpe catheter was advanced into the SVC and eventually the IVC using a Bentson wire. It was difficult to access the IVC. Superstiff Amplatz wire was placed and the tract was dilated to accommodate a 10 [place] sheath. A Kumpe catheter was used to cannulate the right hepatic vein and venograms were performed. Due to patient's renal insufficiency, iodinated contrast was not used for this procedure.  Venograms were performed with carbon dioxide. Multiple wedged and free hepatic vein pressures were taken. Wedge position was confirmed using carbon dioxide. However, the waveforms in the hepatic veins were very poor including the free hepatic pressures. These pressures were not felt to be reliable. It was difficult to aspirate and flush the catheter. Catheter became occluded and was completely removed to be flushed. After the catheter was completely removed, unable to successfully cannulate a hepatic vein again. At this point, the procedure was stopped and we were unable to perform the transjugular liver biopsy. The 10 French sheath was removed with manual compression. FINDINGS: Technically challenging to cannulate the hepatic veins. Eventually, 5 French catheter was advanced into the hepatic venous system and a series of venograms performed using carbon dioxide. Wedged and free hepatic venograms were performed. There was opacification of the portal system  during at least 1 of the wedge venograms. Hepatic vein pressures: 1. Free hepatic 25, wedge 13 2. Free hepatic 25, wedge 8 3. Free hepatic 16, wedge 11 IMPRESSION: 1. Technically challenging examination due to patient's anatomy and inability to use iodinated contrast. Series of hepatic venous pressures were obtained but there were poor waveforms and do not feel like these hepatic venous pressures are reliable. 2. Transjugular liver biopsy was not attempted. Procedure findings were discussed with the referring physician, Dr. Havery Moros. Patient will be scheduled for an ultrasound-guided percutaneous random liver biopsy. Electronically Signed   By: Markus Daft M.D.   On: 08/22/2021 18:53   XR Cervical Spine 2 or 3 views  Result Date: 09/13/2021 Multilevel spondylosis of cervical spine was noted with anterior and posterior osteophytes.  Most significant narrowing was noted between C5-C6 and C6-C7.  Facet joint arthropathy was noted. Impression: These findings are  consistent with multilevel spondylosis and facet joint arthropathy.  XR Hand 2 View Left  Result Date: 09/13/2021 Severe CMC PIP and DIP narrowing was noted.  Third MCP narrowing was noted.  No intercarpal or radiocarpal joint space narrowing was noted. Impression: These findings are consistent with osteoarthritis of the hand.  XR Hand 2 View Right  Result Date: 09/13/2021 CMC, PIP and DIP narrowing was noted.  Third MCP narrowing was noted.  No intercarpal or radiocarpal joint space narrowing was noted.  Cystic changes were noted in the first DIP and ulnar styloid. Impression: These findings are consistent with osteoarthritis of the hand.  XR Lumbar Spine 2-3 Views  Result Date: 09/13/2021 No significant disc space narrowing was noted.  Facet joint arthropathy with foraminal narrowing was noted.  Anterior osteophytes were noted. Impression: These findings are consistent with mild spondylosis and facet joint arthropathy.   Recent Labs: Lab Results  Component Value Date   WBC 8.8 09/12/2021   HGB 13.0 09/12/2021   PLT 230 09/12/2021   NA 139 09/12/2021   K 4.0 09/12/2021   CL 105 09/12/2021   CO2 29 09/12/2021   GLUCOSE 111 (H) 09/12/2021   BUN 39 (H) 09/12/2021   CREATININE 3.20 (HH) 09/12/2021   BILITOT 0.5 09/12/2021   ALKPHOS 166 (H) 09/12/2021   AST 36 09/12/2021   ALT 31 09/12/2021   PROT 5.6 (L) 09/12/2021   ALBUMIN 3.1 (L) 09/12/2021   CALCIUM 8.0 (L) 09/12/2021   GFRAA 39 (L) 04/26/2020    Speciality Comments: No specialty comments available.  Procedures:  No procedures performed Allergies: Iodine and Prozac [fluoxetine hcl]   Assessment / Plan:     Visit Diagnoses: Inflammatory arthritis-patient had inflammatory arthritis in the past.  No synovitis was noted on the examination today.  High risk medication use - Previously on PLQ  Primary osteoarthritis of both hands-he complains of pain and discomfort in his bilateral hands.  He has been using Voltaren gel and  also using compression gloves.  No synovitis was noted.  X-rays of bilateral hands were obtained today which were consistent with severe osteoarthritis involving bilateral CMC PIP and DIP joints.  Bilateral third MCP narrowing was also noted.  No erosive changes were noted.  X-ray findings were discussed with the patient.  Primary osteoarthritis of both knees-he has known history of osteoarthritis in his knee joints.  He is off-and-on discomfort.  Contracture, right elbow-old and unchanged.  DDD (degenerative disc disease), cervical-he complains of increased discomfort and pain in his cervical spine.  X-rays of the cervical spine 2 views were obtained today.  X-rays showed multilevel spondylosis with most significant narrowing between C5-C6 and C6-C7.  Facet joint arthropathy were noted.  X-ray findings were discussed with the patient.  I offered physical therapy which she declined.  I gave him a handout on exercises.  DDD (degenerative disc disease), lumbar-he has been experiencing increased lower back pain.  He had good mobility of the spine with some discomfort.  X-ray showed facet joint arthropathy.  Mild spondylosis was also noted.  X-ray findings were discussed with the patient.  He declined physical therapy.  Handout on back exercises was given.  Other chronic pain-he has chronic pain due to underlying osteoarthritis.  Other medical problems are listed as follows:  Renal cell carcinoma, unspecified laterality ([place]) - followed by oncology.  Primary hypertension  History of type 1 diabetes mellitus  Malignant neoplasm metastatic to right lung [facility])  Peripheral vascular disorder ([facility])  Bleeding esophageal varices, unspecified esophageal varices type ([facility])  Thyroid cancer ([facility])  History of CHF (congestive heart failure)  Postoperative hypothyroidism  Renal insufficiency-patient is concerned that his renal insufficiency is coming from arthritis.  I assured him that arthritis is not  the cause of the renal insufficiency.  Other fatigue  Mixed hyperlipidemia  Positive QuantiFERON-TB Gold test  Hyperbilirubinemia  Orders: Orders Placed This Encounter  Procedures   XR Cervical Spine 2 or 3 views   XR Lumbar Spine 2-3 Views   XR Hand 2 View Right   XR Hand 2 View Left   No orders of the defined types were placed in this encounter.   Face-to-face time spent with patient was 45 minutes. Greater than 50% of time was spent in counseling and coordination of care.  Follow-Up Instructions: Return if symptoms worsen or fail to improve, for Osteoarthritis.   Bo Merino, MD  Note - This record has been created using Editor, commissioning.  Chart creation errors have been sought, but may not always  have been located. Such creation errors do not reflect on  the standard of medical care.

## 2021-09-11 ENCOUNTER — Other Ambulatory Visit: Payer: Self-pay

## 2021-09-11 DIAGNOSIS — C649 Malignant neoplasm of unspecified kidney, except renal pelvis: Secondary | ICD-10-CM

## 2021-09-12 ENCOUNTER — Inpatient Hospital Stay: Payer: Medicare Other | Attending: Hematology

## 2021-09-12 ENCOUNTER — Telehealth: Payer: Self-pay | Admitting: *Deleted

## 2021-09-12 ENCOUNTER — Inpatient Hospital Stay: Payer: Medicare Other | Admitting: Hematology

## 2021-09-12 ENCOUNTER — Other Ambulatory Visit: Payer: Self-pay

## 2021-09-12 VITALS — BP 158/76 | HR 57 | Temp 97.5°F | Resp 20 | Wt 177.7 lb

## 2021-09-12 DIAGNOSIS — I129 Hypertensive chronic kidney disease with stage 1 through stage 4 chronic kidney disease, or unspecified chronic kidney disease: Secondary | ICD-10-CM | POA: Diagnosis not present

## 2021-09-12 DIAGNOSIS — C649 Malignant neoplasm of unspecified kidney, except renal pelvis: Secondary | ICD-10-CM

## 2021-09-12 DIAGNOSIS — E1122 Type 2 diabetes mellitus with diabetic chronic kidney disease: Secondary | ICD-10-CM | POA: Insufficient documentation

## 2021-09-12 DIAGNOSIS — N189 Chronic kidney disease, unspecified: Secondary | ICD-10-CM | POA: Diagnosis not present

## 2021-09-12 DIAGNOSIS — C7800 Secondary malignant neoplasm of unspecified lung: Secondary | ICD-10-CM | POA: Diagnosis not present

## 2021-09-12 DIAGNOSIS — M069 Rheumatoid arthritis, unspecified: Secondary | ICD-10-CM | POA: Diagnosis not present

## 2021-09-12 DIAGNOSIS — Z87891 Personal history of nicotine dependence: Secondary | ICD-10-CM | POA: Insufficient documentation

## 2021-09-12 LAB — CMP (CANCER CENTER ONLY)
ALT: 31 U/L (ref 0–44)
AST: 36 U/L (ref 15–41)
Albumin: 3.1 g/dL — ABNORMAL LOW (ref 3.5–5.0)
Alkaline Phosphatase: 166 U/L — ABNORMAL HIGH (ref 38–126)
Anion gap: 5 (ref 5–15)
BUN: 39 mg/dL — ABNORMAL HIGH (ref 8–23)
CO2: 29 mmol/L (ref 22–32)
Calcium: 8 mg/dL — ABNORMAL LOW (ref 8.9–10.3)
Chloride: 105 mmol/L (ref 98–111)
Creatinine: 3.2 mg/dL (ref 0.61–1.24)
GFR, Estimated: 20 mL/min — ABNORMAL LOW (ref >60)
Glucose, Bld: 111 mg/dL — ABNORMAL HIGH (ref 70–99)
Potassium: 4 mmol/L (ref 3.5–5.1)
Sodium: 139 mmol/L (ref 135–145)
Total Bilirubin: 0.5 mg/dL (ref 0.3–1.2)
Total Protein: 5.6 g/dL — ABNORMAL LOW (ref 6.5–8.1)

## 2021-09-12 LAB — CBC WITH DIFFERENTIAL (CANCER CENTER ONLY)
Abs Immature Granulocytes: 0.02 10*3/uL (ref 0.00–0.07)
Basophils Absolute: 0.1 10*3/uL (ref 0.0–0.1)
Basophils Relative: 1 %
Eosinophils Absolute: 0.9 10*3/uL — ABNORMAL HIGH (ref 0.0–0.5)
Eosinophils Relative: 10 %
HCT: 40.3 % (ref 39.0–52.0)
Hemoglobin: 13 g/dL (ref 13.0–17.0)
Immature Granulocytes: 0 %
Lymphocytes Relative: 24 %
Lymphs Abs: 2.1 10*3/uL (ref 0.7–4.0)
MCH: 34.2 pg — ABNORMAL HIGH (ref 26.0–34.0)
MCHC: 32.3 g/dL (ref 30.0–36.0)
MCV: 106.1 fL — ABNORMAL HIGH (ref 80.0–100.0)
Monocytes Absolute: 1.3 10*3/uL — ABNORMAL HIGH (ref 0.1–1.0)
Monocytes Relative: 15 %
Neutro Abs: 4.3 10*3/uL (ref 1.7–7.7)
Neutrophils Relative %: 50 %
Platelet Count: 230 10*3/uL (ref 150–400)
RBC: 3.8 MIL/uL — ABNORMAL LOW (ref 4.22–5.81)
RDW: 13.2 % (ref 11.5–15.5)
WBC Count: 8.8 10*3/uL (ref 4.0–10.5)
nRBC: 0 % (ref 0.0–0.2)

## 2021-09-12 LAB — LACTATE DEHYDROGENASE: LDH: 260 U/L — ABNORMAL HIGH (ref 98–192)

## 2021-09-12 NOTE — Progress Notes (Cosign Needed Addendum)
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 2) Remote history of metastatic renal cell carcinoma Diagnosed with renal cell carcinoma 20 years ago and had a right nephrectomy. About 8 years after that he was noted to have abdominal recurrence in his pancreas spleen and small intestine and had a Whipple's procedure and significant abdominal surgery and notes that 10 out of 17 lymph nodes were positive. He also had his gallbladder removed. Postoperative course was complicated by an internal hemorrhage as per his report. Patient notes 2-3 years after that he had recurrence in his thyroid that led to a thyroidectomy. [June 2004] later he had partial gastrectomy for local recurrence. [February 2005] when he presented with GI bleeding. Patient notes that he has had no known evidence of recurrence over the last 8 years until his recent CT scan showed lung nodules.  Current Treatment: Active surveillance  Previous treatment Sutent on cycle 1 - 50 mg by mouth daily for 4 weeks on and 2-weeks off. It was dose adjusted to 37.5 mg by mouth daily for 2 weeks with 1 week of to help mitigate issues with fatigue, mild hyperbilirubinemia, cytopenias. SBRT to dominant RML nodule. Sutent 37.5 mg po daily for 2 weeks on and 1 weeks off. (on hold now from 10/2020)  INTERVAL HISTORY:  [name] is here for continued evaluation and management of his metastatic renal cell carcinoma. He notes continued joint pain and swelling due to his rheumatoid arthritis and osteoarthritis. He notes his leg swelling has nearly resolved and he continues to  follow-up with Dr. Candiss Norse for his chronic kidney issues. No fevers no chills no night sweats.  No unexpected sudden weight loss. No new lumps or bumps. No shortness of breath or chest pain.  No abdominal pain or distention. No other acute new focal symptoms. Labs done today reviewed in details.   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 Past Surgical History:  Procedure Laterality Date   CATARACT EXTRACTION Bilateral 2013   CHOLECYSTECTOMY     GASTRECTOMY     tumor removed   GLAUCOMA SURGERY     Laser surgery   IR US GUIDE VASC ACCESS RIGHT  08/22/2021   IR VENOGRAM HEPATIC W HEMODYNAMIC EVALUATION  08/22/2021   LIPOMA EXCISION Right 1999   NEPHRECTOMY Right    PANCREATECTOMY     SPLENECTOMY     THYROIDECTOMY     URETERAL STENT PLACEMENT Left 2012   WHIPPLE PROCEDURE      .  Social History   Tobacco Use   Smoking status: Former    Years: 1.50    Types: Cigarettes   Smokeless tobacco: Never  Vaping Use   Vaping Use: Never used  Substance Use Topics   Alcohol use: Yes    Comment: occasional beer   Drug use: No    ALLERGIES:  is allergic to iodine and prozac [fluoxetine hcl].  MEDICATIONS:  Current Outpatient Medications  Medication Sig Dispense Refill   aspirin 81 MG tablet Take 81 mg by mouth daily.      atenolol  (TENORMIN) 100 MG tablet Take 100 mg by mouth daily.      augmented betamethasone dipropionate (DIPROLENE-AF) 0.05 % cream Apply 1 application topically daily as needed (rash).   0   azelastine (OPTIVAR) 0.05 % ophthalmic solution Apply 1 drop to eye in the morning and at bedtime. As needed     calcium citrate-vitamin D (CITRACAL+D) 315-200 MG-UNIT per tablet Take 2 tablets by mouth 2 (two) times daily.      Cholecalciferol (VITAMIN D3) 50 MCG (2000 UT) TABS Take 1 tablet by mouth daily.     Cyanocobalamin (VITAMIN B-12) 2500 MCG SUBL Take 2,500 mcg by mouth daily.      diclofenac Sodium (VOLTAREN) 1 % GEL Apply 3 g topically 3 (three) times daily as needed (joint pain).      dicyclomine (BENTYL) 10 MG capsule Take 1 capsule (10 mg total) by mouth every 8 (eight) hours as needed for spasms. 30 capsule 1   docusate sodium (COLACE) 100 MG capsule Take 100 mg by mouth 2 (two) times daily as needed for mild constipation.      dorzolamide-timolol (COSOPT) 22.3-6.8 MG/ML ophthalmic solution Place 1 drop into both eyes 2 (two) times daily.   1   famotidine (PEPCID) 20 MG tablet Take 20 mg by mouth 3 (three) times daily.     gabapentin (NEURONTIN) 300 MG capsule Take 300 mg by mouth as needed.     Glucosamine-MSM-Hyaluronic Acd 750-375-30 MG TABS Take 2 tablets by mouth daily.     hydrALAZINE (APRESOLINE) 25 MG tablet Take 25 mg by mouth 3 (three) times daily.     HYDROcodone-acetaminophen (NORCO/VICODIN) 5-325 MG tablet Take 1 tablet by mouth every 6 (six) hours as needed.     insulin lispro (HUMALOG) 100 UNIT/ML injection Inject 2-4 Units into the skin 3 (three) times daily before meals. Per sliding scale     Lactobacillus (ACIDOPHILUS) CAPS capsule Take 1 capsule by mouth daily.     latanoprost (XALATAN) 0.005 % ophthalmic solution Place 1 drop into both eyes at bedtime.   1   LEVEMIR 100 UNIT/ML injection Inject 7 Units into the skin 2 (two) times daily.  1   levothyroxine (SYNTHROID) 200 MCG tablet  Take 200 mcg by mouth daily before breakfast.     levothyroxine (SYNTHROID) 50 MCG tablet Take 50 mcg by mouth daily before breakfast.     losartan (COZAAR) 25 MG tablet Take 25 mg by mouth daily.     lovastatin (MEVACOR) 40 MG tablet Take 40 mg by mouth at bedtime.      Multiple Vitamins-Minerals (ICAPS) TABS Take 1 tablet by mouth 2 (two) times daily.     Omega-3 Fatty Acids (FISH OIL) 1200 MG CAPS Take 1 capsule by mouth daily.     omeprazole (PRILOSEC) 20 MG capsule Take 1 capsule (20 mg total) by mouth daily. 30 capsule 3   Pancrelipase, Lip-Prot-Amyl, 6000-19000 units CPEP Take 4 capsules  by mouth 3 (three) times daily.     sildenafil (VIAGRA) 100 MG tablet Take 100 mg by mouth daily as needed.     torsemide (DEMADEX) 20 MG tablet Take 20 mg by mouth 2 (two) times daily.     traMADol (ULTRAM) 50 MG tablet Take 50 mg by mouth 2 (two) times daily as needed.     No current facility-administered medications for this visit.    REVIEW OF SYSTEMS:   .10 Point review of Systems was done is negative except as noted above.  PHYSICAL EXAMINATION: ECOG FS:1 - Symptomatic but completely ambulatory  There were no vitals filed for this visit.  Wt Readings from Last 3 Encounters:  08/09/21 178 lb 2 oz (80.8 kg)  06/20/21 175 lb 8 oz (79.6 kg)  04/01/21 191 lb (86.6 kg)   There is no height or weight on file to calculate BMI.      NAD GENERAL:alert, in no acute distress and comfortable SKIN: no acute rashes, no significant lesions EYES: conjunctiva are pink and non-injected, sclera anicteric OROPHARYNX: MMM, no exudates, no oropharyngeal erythema or ulceration NECK: supple, no JVD LYMPH:  no palpable lymphadenopathy in the cervical, axillary or inguinal regions LUNGS: clear to auscultation b/l with normal respiratory effort HEART: regular rate & rhythm ABDOMEN:  normoactive bowel sounds , non tender, not distended.  No palpable hepatosplenomegaly. Extremity: no pedal edema PSYCH: alert  & oriented x 3 with fluent speech NEURO: no focal motor/sensory deficits  LABORATORY DATA:   I have reviewed the data as listed  . CBC Latest Ref Rng & Units 09/12/2021 08/22/2021  WBC 4.0 - 10.5 K/uL 8.8 9.0  Hemoglobin 13.0 - 17.0 g/dL 13.0 13.0  Hematocrit 39.0 - 52.0 % 40.3 40.0  Platelets 150 - 400 K/uL 230 231  ANC 1.4k . CBC    Component Value Date/Time   WBC 8.4 09/16/2021 1151   RBC 3.93 (L) 09/16/2021 1151   HGB 13.4 09/16/2021 1151   HGB 13.0 09/12/2021 0919   HGB 14.0 06/11/2017 0808   HCT 41.7 09/16/2021 1151   HCT 40.7 06/11/2017 0808   PLT 234 09/16/2021 1151   PLT 230 09/12/2021 0919   PLT 223 06/11/2017 0808   MCV 106.1 (H) 09/16/2021 1151   MCV 112.1 (H) 06/11/2017 0808   MCH 34.1 (H) 09/16/2021 1151   MCHC 32.1 09/16/2021 1151   RDW 12.9 09/16/2021 1151   RDW 13.9 06/11/2017 0808   LYMPHSABS 2.4 09/16/2021 1151   LYMPHSABS 1.6 06/11/2017 0808   MONOABS 1.2 (H) 09/16/2021 1151   MONOABS 0.5 06/11/2017 0808   EOSABS 0.9 (H) 09/16/2021 1151   EOSABS 0.1 06/11/2017 0808   BASOSABS 0.1 09/16/2021 1151   BASOSABS 0.0 06/11/2017 0808   . CMP Latest Ref Rng & Units 09/12/2021 08/22/2021  Glucose 70 - 99 mg/dL 111(H) 201(H)  BUN 8 - 23 mg/dL 39(H) 29(H)  Creatinine 0.61 - 1.24 mg/dL 3.20(HH) 2.61(H)  Sodium 135 - 145 mmol/L 139 138  Potassium 3.5 - 5.1 mmol/L 4.0 4.6  Chloride 98 - 111 mmol/L 105 107  CO2 22 - 32 mmol/L 29 22  Calcium 8.9 - 10.3 mg/dL 8.0(L) 7.6(L)  Total Protein 6.5 - 8.1 g/dL 5.6(L) 5.3(L)  Total Bilirubin 0.3 - 1.2 mg/dL 0.5 1.1  Alkaline Phos 38 - 126 U/L 166(H) 140(H)  AST 15 - 41 U/L 36 71(H)  ALT 0 - 44 U/L 31 58(H)   08/18/2019 CT Abdomen Pelvis Wo Contrast (Accession [id number])   08/18/2019  CT Chest Wo Contrast (Accession [id number])   RADIOGRAPHIC STUDIES: I have personally reviewed the radiological images as listed and agreed with the findings in the report. IR Venogram Hepatic W Hemodynamic Evaluation  Result Date:  08/22/2021 INDICATION: 71 year old with elevated liver enzymes. Esophageal varices without bleeding. Patient has chronic renal insufficiency with a solitary left kidney. Patient is also status post Whipple procedure. Request for transjugular liver biopsy with hepatic vein pressures. EXAM: 1. Ultrasound guidance for vascular access 2. Hepatic venography with carbon dioxide 3. Hepatic venous pressures MEDICATIONS: Moderate sedation ANESTHESIA/SEDATION: Moderate (conscious) sedation was employed during this procedure. A total of Versed 2.0mg  and fentanyl 100 mcg was administered intravenously at the order of the provider performing the procedure. Total intra-service moderate sedation time: 74 minutes minutes. Patient's level of consciousness and vital signs were monitored continuously by radiology nurse throughout the procedure under the supervision of the provider performing the procedure. FLUOROSCOPY TIME:  Fluoroscopy Time: 22 minutes, 24 seconds, 676 mGy COMPLICATIONS: None immediate. PROCEDURE: Informed written consent was obtained from the patient after a thorough discussion of the procedural risks, benefits and alternatives. All questions were addressed. Maximal Sterile Barrier Technique was utilized including caps, mask, sterile gowns, sterile gloves, sterile drape, hand hygiene and skin antiseptic. A timeout was performed prior to the initiation of the procedure. Ultrasound confirmed a patent right internal jugular vein. Ultrasound image was saved for documentation. Right neck was prepped and draped in sterile fashion. Skin was anesthetized with 1% lidocaine. Small incision was made. 21 gauge needle was directed into the right internal jugular vein with ultrasound guidance. Micropuncture dilator set was placed. A Kumpe catheter was advanced into the SVC and eventually the IVC using a Bentson wire. It was difficult to access the IVC. Superstiff Amplatz wire was placed and the tract was dilated to accommodate a  10 [place] sheath. A Kumpe catheter was used to cannulate the right hepatic vein and venograms were performed. Due to patient's renal insufficiency, iodinated contrast was not used for this procedure. Venograms were performed with carbon dioxide. Multiple wedged and free hepatic vein pressures were taken. Wedge position was confirmed using carbon dioxide. However, the waveforms in the hepatic veins were very poor including the free hepatic pressures. These pressures were not felt to be reliable. It was difficult to aspirate and flush the catheter. Catheter became occluded and was completely removed to be flushed. After the catheter was completely removed, unable to successfully cannulate a hepatic vein again. At this point, the procedure was stopped and we were unable to perform the transjugular liver biopsy. The 10 French sheath was removed with manual compression. FINDINGS: Technically challenging to cannulate the hepatic veins. Eventually, 5 French catheter was advanced into the hepatic venous system and a series of venograms performed using carbon dioxide. Wedged and free hepatic venograms were performed. There was opacification of the portal system during at least 1 of the wedge venograms. Hepatic vein pressures: 1. Free hepatic 25, wedge 13 2. Free hepatic 25, wedge 8 3. Free hepatic 16, wedge 11 IMPRESSION: 1. Technically challenging examination due to patient's anatomy and inability to use iodinated contrast. Series of hepatic venous pressures were obtained but there were poor waveforms and do not feel like these hepatic venous pressures are reliable. 2. Transjugular liver biopsy was not attempted. Procedure findings were discussed with the referring physician, Dr. Havery Moros. Patient will be scheduled for an ultrasound-guided percutaneous random liver biopsy. Electronically Signed   By: Markus Daft M.D.   On: 08/22/2021  18:53   IR US Guide Vasc Access Right  Result Date: 08/22/2021 INDICATION: 71 year old  with elevated liver enzymes. Esophageal varices without bleeding. Patient has chronic renal insufficiency with a solitary left kidney. Patient is also status post Whipple procedure. Request for transjugular liver biopsy with hepatic vein pressures. EXAM: 1. Ultrasound guidance for vascular access 2. Hepatic venography with carbon dioxide 3. Hepatic venous pressures MEDICATIONS: Moderate sedation ANESTHESIA/SEDATION: Moderate (conscious) sedation was employed during this procedure. A total of Versed 2.0mg  and fentanyl 100 mcg was administered intravenously at the order of the provider performing the procedure. Total intra-service moderate sedation time: 74 minutes minutes. Patient's level of consciousness and vital signs were monitored continuously by radiology nurse throughout the procedure under the supervision of the provider performing the procedure. FLUOROSCOPY TIME:  Fluoroscopy Time: 22 minutes, 24 seconds, 237 mGy COMPLICATIONS: None immediate. PROCEDURE: Informed written consent was obtained from the patient after a thorough discussion of the procedural risks, benefits and alternatives. All questions were addressed. Maximal Sterile Barrier Technique was utilized including caps, mask, sterile gowns, sterile gloves, sterile drape, hand hygiene and skin antiseptic. A timeout was performed prior to the initiation of the procedure. Ultrasound confirmed a patent right internal jugular vein. Ultrasound image was saved for documentation. Right neck was prepped and draped in sterile fashion. Skin was anesthetized with 1% lidocaine. Small incision was made. 21 gauge needle was directed into the right internal jugular vein with ultrasound guidance. Micropuncture dilator set was placed. A Kumpe catheter was advanced into the SVC and eventually the IVC using a Bentson wire. It was difficult to access the IVC. Superstiff Amplatz wire was placed and the tract was dilated to accommodate a 10 [place] sheath. A Kumpe catheter  was used to cannulate the right hepatic vein and venograms were performed. Due to patient's renal insufficiency, iodinated contrast was not used for this procedure. Venograms were performed with carbon dioxide. Multiple wedged and free hepatic vein pressures were taken. Wedge position was confirmed using carbon dioxide. However, the waveforms in the hepatic veins were very poor including the free hepatic pressures. These pressures were not felt to be reliable. It was difficult to aspirate and flush the catheter. Catheter became occluded and was completely removed to be flushed. After the catheter was completely removed, unable to successfully cannulate a hepatic vein again. At this point, the procedure was stopped and we were unable to perform the transjugular liver biopsy. The 10 French sheath was removed with manual compression. FINDINGS: Technically challenging to cannulate the hepatic veins. Eventually, 5 French catheter was advanced into the hepatic venous system and a series of venograms performed using carbon dioxide. Wedged and free hepatic venograms were performed. There was opacification of the portal system during at least 1 of the wedge venograms. Hepatic vein pressures: 1. Free hepatic 25, wedge 13 2. Free hepatic 25, wedge 8 3. Free hepatic 16, wedge 11 IMPRESSION: 1. Technically challenging examination due to patient's anatomy and inability to use iodinated contrast. Series of hepatic venous pressures were obtained but there were poor waveforms and do not feel like these hepatic venous pressures are reliable. 2. Transjugular liver biopsy was not attempted. Procedure findings were discussed with the referring physician, Dr. Havery Moros. Patient will be scheduled for an ultrasound-guided percutaneous random liver biopsy. Electronically Signed   By: Markus Daft M.D.   On: 08/22/2021 18:53      ASSESSMENT & PLAN:  Discussed lab results with patients. Liver enzyme levels have improved. Pt experiences  pain  when left hand is in extended position.  #1 Recurrent metastatic Renal cell carcinoma with multiple lung metastases.  Has been on stable on 1st line Sutent for >39yr. Has had SBRT to the dominant right lung nodule with partial response. No lab or clinical evidence of metastatic RCC progression at this time.  PET/CT 08/08/2016 with a couple of mildly enlarge LLL pulmonary nodules - no other overt evidence of RCC progression at this time.  PET CT scan 11/06/2016 - no overt evidence of RCC progression at this time .  PET/CT 03/13/2017 - no overt evidence of RCC progression at this time .  PET/CT 08/10/2017 - no overt evidence of RCC progression at this time .  PET/ CT done 08/10/2017, shows no evidence of  Progression at this time.  12/07/17 PET which revealed No findings suspicious for recurrent or metastatic disease.  03/22/18 PET/CT revealed One of the left lower lobe nodules has very minimally enlarged over the last 2 years, currently 0.6 by 0.4 cm and measuring 0.4 by 0.3 cm on 03/20/2016. This probably represents a previously treated metastatic nodule given that it measured 1.0 by 0.8 cm on 06/06/2015. This nodule may be subject to some slice selection phenomenon and also motion artifact given proximity to the diaphragm. Surveillance is likely warranted. 2. Chronic central lucency, marginal bony deposition, and slow healing of the right lateral fourth rib fracture. Possibilities may include underlying radiation necrosis as a cause for fracture leading to slow healing, or a low-grade pathologic lesion. Maximum SUV at this fracture site is 2.9, previously 2.4. 3. 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 There is only low-grade activity in this vicinity characteristic of radiation fibrosis, without focal activity. 4. Other imaging findings of potential clinical significance: Aortic Atherosclerosis. Coronary atherosclerosis.  Mild cardiomegaly. Right nephrectomy. 8 mm nonobstructive calculus in the left kidney lower pole.   08/24/18 CT C/A/P revealed "Stable small left lower lobe pulmonary nodules from recent prior studies. As previously noted, these have decreased in size from PET-CT 06/06/2015, likely treated metastases. No new or enlarging pulmonary nodules. 2. Postsurgical changes in the upper abdomen without evidence of local recurrence or metastatic disease. 3. Nonobstructing left renal calculus. 4. Stable radiation changes in the right upper lobe with probable chronic pathologic fracture of the right 4th rib laterally."  03/07/2019 CT C/A wo contrast revealed "1. Stable left lower lobe pulmonary nodules, likely treated Metastases. 2. No additional evidence of metastatic disease. 3. Left renal stone. 4. Post radiation scarring in the right hemithorax with a healed adjacent right rib fracture which is presumably pathologic in Etiology. 5.  Aortic atherosclerosis (ICD10-170.0). 6. 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 Confirmed by [place].  #4 mild thrombocytopenia likely related to Sutent- resolved with dose reduction. #5 Abnormal Liver function test - significant bump in transaminases. -resolved on labs today #6 grade 1 fatigue due to Sutent -better with dose reduction and changing schedule to 2 week on and 1 week. #7 Dysguesia from Sutent - manageable per patient. He is trying to eat as well as he can and is maintaining his body weight.  #8 chronic kidney disease/single kidney -following with Dr Candiss Norse for mx with Nephrotic syndrome #10 rheumatoid arthritis -follows with Dr. Estanislado Pandy for mx #11 DM2 -continue f/u with Dr Philip Aspen for continue mx, blood sugar at home per  patient.  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  His CBC shows normal blood  counts. CMP some bump in creatinine to 3.2.  Recommended to follow-up closely with his kidney doctor and avoid NSAIDs and maintain good hydration. -LDH stable at 260 -Patient has no clinical signs or symptoms of progression of his metastatic renal cell carcinoma at this time. -Given his other medical issues and consideration of significant proteinuria we shall continue to hold off on the sunitinib at this time. -We are also holding off on starting him on immunotherapy due to his significant rheumatoid arthritis which could flare with immunotherapy. -We shall continue maintain active surveillance with a repeat CT chest abdomen pelvis without IV contrast in 11 weeks and we shall see him back in 12 weeks   FOLLOW UP: CT chest /abd/pelvis in 11 weeks RTC with Dr Irene Limbo with labs in 12 weeks  All of the patient's questions were answered with apparent satisfaction. The patient knows to call the clinic with any problems, questions or concerns.   Sullivan Lone MD MS AAHIVMS [facility] Hematology/Oncology Physician [facility]  (Office):       [phone] (Work cell):  [phone] (Fax):           [phone]  I, Melene Muller, am acting as scribe for Dr. Sullivan Lone, MD.

## 2021-09-12 NOTE — Telephone Encounter (Signed)
CRITICAL VALUE STICKER  CRITICAL VALUE:Creatinine  RECEIVER (on-site recipient of call):Sandi K, RN  DATE & TIME NOTIFIED: 09/12/21;10:12am  MESSENGER (representative from lab):Rosann Auerbach  MD NOTIFIED: Dr. Irene Limbo   TIME OF NOTIFICATION:1020  RESPONSE: Information acknowledged

## 2021-09-13 ENCOUNTER — Ambulatory Visit: Payer: Self-pay

## 2021-09-13 ENCOUNTER — Other Ambulatory Visit: Payer: Self-pay | Admitting: Radiology

## 2021-09-13 ENCOUNTER — Ambulatory Visit: Payer: Medicare Other | Admitting: Rheumatology

## 2021-09-13 ENCOUNTER — Encounter: Payer: Self-pay | Admitting: Rheumatology

## 2021-09-13 ENCOUNTER — Other Ambulatory Visit (HOSPITAL_COMMUNITY): Payer: Self-pay | Admitting: Physician Assistant

## 2021-09-13 VITALS — BP 194/86 | HR 54 | Ht 69.0 in | Wt 181.6 lb

## 2021-09-13 DIAGNOSIS — M1990 Unspecified osteoarthritis, unspecified site: Secondary | ICD-10-CM

## 2021-09-13 DIAGNOSIS — G8929 Other chronic pain: Secondary | ICD-10-CM

## 2021-09-13 DIAGNOSIS — M5136 Other intervertebral disc degeneration, lumbar region: Secondary | ICD-10-CM

## 2021-09-13 DIAGNOSIS — M19041 Primary osteoarthritis, right hand: Secondary | ICD-10-CM

## 2021-09-13 DIAGNOSIS — M170 Bilateral primary osteoarthritis of knee: Secondary | ICD-10-CM | POA: Diagnosis not present

## 2021-09-13 DIAGNOSIS — M24521 Contracture, right elbow: Secondary | ICD-10-CM

## 2021-09-13 DIAGNOSIS — M19042 Primary osteoarthritis, left hand: Secondary | ICD-10-CM

## 2021-09-13 DIAGNOSIS — C649 Malignant neoplasm of unspecified kidney, except renal pelvis: Secondary | ICD-10-CM

## 2021-09-13 DIAGNOSIS — I739 Peripheral vascular disease, unspecified: Secondary | ICD-10-CM

## 2021-09-13 DIAGNOSIS — E782 Mixed hyperlipidemia: Secondary | ICD-10-CM

## 2021-09-13 DIAGNOSIS — I10 Essential (primary) hypertension: Secondary | ICD-10-CM

## 2021-09-13 DIAGNOSIS — Z8639 Personal history of other endocrine, nutritional and metabolic disease: Secondary | ICD-10-CM

## 2021-09-13 DIAGNOSIS — N289 Disorder of kidney and ureter, unspecified: Secondary | ICD-10-CM

## 2021-09-13 DIAGNOSIS — Z8679 Personal history of other diseases of the circulatory system: Secondary | ICD-10-CM

## 2021-09-13 DIAGNOSIS — R7612 Nonspecific reaction to cell mediated immunity measurement of gamma interferon antigen response without active tuberculosis: Secondary | ICD-10-CM

## 2021-09-13 DIAGNOSIS — M5030 Other cervical disc degeneration, unspecified cervical region: Secondary | ICD-10-CM

## 2021-09-13 DIAGNOSIS — E890 Postprocedural hypothyroidism: Secondary | ICD-10-CM

## 2021-09-13 DIAGNOSIS — R5383 Other fatigue: Secondary | ICD-10-CM

## 2021-09-13 DIAGNOSIS — C7801 Secondary malignant neoplasm of right lung: Secondary | ICD-10-CM

## 2021-09-13 DIAGNOSIS — C73 Malignant neoplasm of thyroid gland: Secondary | ICD-10-CM

## 2021-09-13 DIAGNOSIS — Z79899 Other long term (current) drug therapy: Secondary | ICD-10-CM

## 2021-09-13 DIAGNOSIS — I8501 Esophageal varices with bleeding: Secondary | ICD-10-CM

## 2021-09-13 NOTE — Patient Instructions (Signed)
Hand Exercises Hand exercises can be helpful for almost anyone. These exercises can strengthen the hands, improve flexibility and movement, and increase blood flow to the hands. These results can make work and daily tasks easier. Hand exercises can be especially helpful for people who have joint pain from arthritis or have nerve damage from overuse (carpal tunnel syndrome). These exercises can also help people who have injured a hand. Exercises Most of these hand exercises are gentle stretching and motion exercises. It is usually safe to do them often throughout the day. Warming up your hands before exercise may help to reduce stiffness. You can do this with gentle massage or by placing your hands in warm water for 10-15 minutes. It is normal to feel some stretching, pulling, tightness, or mild discomfort as you begin new exercises. This will gradually improve. Stop an exercise right away if you feel sudden, severe pain or your pain gets worse. Ask your health care provider which exercises are best for you. Knuckle bend or "claw" fist  Stand or sit with your arm, hand, and all five fingers pointed straight up. Make sure to keep your wrist straight during the exercise. Gently bend your fingers down toward your palm until the tips of your fingers are touching the top of your palm. Keep your big knuckle straight and just bend the small knuckles in your fingers. Hold this position for __________ seconds. Straighten (extend) your fingers back to the starting position. Repeat this exercise 5-10 times with each hand. Full finger fist  Stand or sit with your arm, hand, and all five fingers pointed straight up. Make sure to keep your wrist straight during the exercise. Gently bend your fingers into your palm until the tips of your fingers are touching the middle of your palm. Hold this position for __________ seconds. Extend your fingers back to the starting position, stretching every joint fully. Repeat  this exercise 5-10 times with each hand. Straight fist Stand or sit with your arm, hand, and all five fingers pointed straight up. Make sure to keep your wrist straight during the exercise. Gently bend your fingers at the big knuckle, where your fingers meet your hand, and the middle knuckle. Keep the knuckle at the tips of your fingers straight and try to touch the bottom of your palm. Hold this position for __________ seconds. Extend your fingers back to the starting position, stretching every joint fully. Repeat this exercise 5-10 times with each hand. Tabletop  Stand or sit with your arm, hand, and all five fingers pointed straight up. Make sure to keep your wrist straight during the exercise. Gently bend your fingers at the big knuckle, where your fingers meet your hand, as far down as you can while keeping the small knuckles in your fingers straight. Think of forming a tabletop with your fingers. Hold this position for __________ seconds. Extend your fingers back to the starting position, stretching every joint fully. Repeat this exercise 5-10 times with each hand. Finger spread  Place your hand flat on a table with your palm facing down. Make sure your wrist stays straight as you do this exercise. Spread your fingers and thumb apart from each other as far as you can until you feel a gentle stretch. Hold this position for __________ seconds. Bring your fingers and thumb tight together again. Hold this position for __________ seconds. Repeat this exercise 5-10 times with each hand. Making circles  Stand or sit with your arm, hand, and all five fingers pointed  straight up. Make sure to keep your wrist straight during the exercise. Make a circle by touching the tip of your thumb to the tip of your index finger. Hold for __________ seconds. Then open your hand wide. Repeat this motion with your thumb and each finger on your hand. Repeat this exercise 5-10 times with each hand. Thumb  motion  Sit with your forearm resting on a table and your wrist straight. Your thumb should be facing up toward the ceiling. Keep your fingers relaxed as you move your thumb. Lift your thumb up as high as you can toward the ceiling. Hold for __________ seconds. Bend your thumb across your palm as far as you can, reaching the tip of your thumb for the small finger (pinkie) side of your palm. Hold for __________ seconds. Repeat this exercise 5-10 times with each hand. Grip strengthening  Hold a stress ball or other soft ball in the middle of your hand. Slowly increase the pressure, squeezing the ball as much as you can without causing pain. Think of bringing the tips of your fingers into the middle of your palm. All of your finger joints should bend when doing this exercise. Hold your squeeze for __________ seconds, then relax. Repeat this exercise 5-10 times with each hand. Contact a health care provider if: Your hand pain or discomfort gets much worse when you do an exercise. Your hand pain or discomfort does not improve within 2 hours after you exercise. If you have any of these problems, stop doing these exercises right away. Do not do them again unless your health care provider says that you can. Get help right away if: You develop sudden, severe hand pain or swelling. If this happens, stop doing these exercises right away. Do not do them again unless your health care provider says that you can. This information is not intended to replace advice given to you by your health care provider. Make sure you discuss any questions you have with your health care provider. Document Revised: 11/08/2020 Document Reviewed: 11/08/2020 [employer] Patient Education  [place]. Back Exercises The following exercises strengthen the muscles that help to support the trunk (torso) and back. They also help to keep the lower back flexible. Doing these exercises can help to prevent or lessen existing low  back pain. If you have back pain or discomfort, try doing these exercises 2-3 times each day or as told by your health care provider. As your pain improves, do them once each day, but increase the number of times that you repeat the steps for each exercise (do more repetitions). To prevent the recurrence of back pain, continue to do these exercises once each day or as told by your health care provider. Do exercises exactly as told by your health care provider and adjust them as directed. It is normal to feel mild stretching, pulling, tightness, or discomfort as you do these exercises, but you should stop right away if you feel sudden pain or your pain gets worse. Exercises Single knee to chest Repeat these steps 3-5 times for each leg: Lie on your back on a firm bed or the floor with your legs extended. Bring one knee to your chest. Your other leg should stay extended and in contact with the floor. Hold your knee in place by grabbing your knee or thigh with both hands and hold. Pull on your knee until you feel a gentle stretch in your lower back or buttocks. Hold the stretch for 10-30 seconds.  Slowly release and straighten your leg.  Pelvic tilt Repeat these steps 5-10 times: Lie on your back on a firm bed or the floor with your legs extended. Bend your knees so they are pointing toward the ceiling and your feet are flat on the floor. Tighten your lower abdominal muscles to press your lower back against the floor. This motion will tilt your pelvis so your tailbone points up toward the ceiling instead of pointing to your feet or the floor. With gentle tension and even breathing, hold this position for 5-10 seconds.  Cat-cow Repeat these steps until your lower back becomes more flexible: Get into a hands-and-knees position on a firm bed or the floor. Keep your hands under your shoulders, and keep your knees under your hips. You may place padding under your knees for comfort. Let your head hang  down toward your chest. Contract your abdominal muscles and point your tailbone toward the floor so your lower back becomes rounded like the back of a cat. Hold this position for 5 seconds. Slowly lift your head, let your abdominal muscles relax, and point your tailbone up toward the ceiling so your back forms a sagging arch like the back of a cow. Hold this position for 5 seconds.  Press-ups Repeat these steps 5-10 times: Lie on your abdomen (face-down) on a firm bed or the floor. Place your palms near your head, about shoulder-width apart. Keeping your back as relaxed as possible and keeping your hips on the floor, slowly straighten your arms to raise the top half of your body and lift your shoulders. Do not use your back muscles to raise your upper torso. You may adjust the placement of your hands to make yourself more comfortable. Hold this position for 5 seconds while you keep your back relaxed. Slowly return to lying flat on the floor.  Bridges Repeat these steps 10 times: Lie on your back on a firm bed or the floor. Bend your knees so they are pointing toward the ceiling and your feet are flat on the floor. Your arms should be flat at your sides, next to your body. Tighten your buttocks muscles and lift your buttocks off the floor until your waist is at almost the same height as your knees. You should feel the muscles working in your buttocks and the back of your thighs. If you do not feel these muscles, slide your feet 1-2 inches (2.5-5 cm) farther away from your buttocks. Hold this position for 3-5 seconds. Slowly lower your hips to the starting position, and allow your buttocks muscles to relax completely. If this exercise is too easy, try doing it with your arms crossed over your chest. Abdominal crunches Repeat these steps 5-10 times: Lie on your back on a firm bed or the floor with your legs extended. Bend your knees so they are pointing toward the ceiling and your feet are flat  on the floor. Cross your arms over your chest. Tip your chin slightly toward your chest without bending your neck. Tighten your abdominal muscles and slowly raise your torso high enough to lift your shoulder blades a tiny bit off the floor. Avoid raising your torso higher than that because it can put too much stress on your lower back and does not help to strengthen your abdominal muscles. Slowly return to your starting position.  Back lifts Repeat these steps 5-10 times: Lie on your abdomen (face-down) with your arms at your sides, and rest your forehead on the floor. Tighten  the muscles in your legs and your buttocks. Slowly lift your chest off the floor while you keep your hips pressed to the floor. Keep the back of your head in line with the curve in your back. Your eyes should be looking at the floor. Hold this position for 3-5 seconds. Slowly return to your starting position.  Contact a health care provider if: Your back pain or discomfort gets much worse when you do an exercise. Your worsening back pain or discomfort does not lessen within 2 hours after you exercise. If you have any of these problems, stop doing these exercises right away. Do not do them again unless your health care provider says that you can. Get help right away if: You develop sudden, severe back pain. If this happens, stop doing the exercises right away. Do not do them again unless your health care provider says that you can. This information is not intended to replace advice given to you by your health care provider. Make sure you discuss any questions you have with your health care provider. Document Revised: 01/15/2021 Document Reviewed: 10/03/2020 Elsevier Patient Education  2022 [employer]. Cervical Strain and Sprain Rehab Ask your health care provider which exercises are safe for you. Do exercises exactly as told by your health care provider and adjust them as directed. It is normal to feel mild  stretching, pulling, tightness, or discomfort as you do these exercises. Stop right away if you feel sudden pain or your pain gets worse. Do not begin these exercises until told by your health care provider. Stretching and range-of-motion exercises Cervical side bending  Using good posture, sit on a stable chair or stand up. Without moving your shoulders, slowly tilt your left / right ear to your shoulder until you feel a stretch in the opposite side neck muscles. You should be looking straight ahead. Hold for __________ seconds. Repeat with the other side of your neck. Repeat __________ times. Complete this exercise __________ times a day. Cervical rotation  Using good posture, sit on a stable chair or stand up. Slowly turn your head to the side as if you are looking over your left / right shoulder. Keep your eyes level with the ground. Stop when you feel a stretch along the side and the back of your neck. Hold for __________ seconds. Repeat this by turning to your other side. Repeat __________ times. Complete this exercise __________ times a day. Thoracic extension and pectoral stretch Roll a towel or a small blanket so it is about 4 inches (10 cm) in diameter. Lie down on your back on a firm surface. Put the towel lengthwise, under your spine in the middle of your back. It should not be under your shoulder blades. The towel should line up with your spine from your middle back to your lower back. Put your hands behind your head and let your elbows fall out to your sides. Hold for __________ seconds. Repeat __________ times. Complete this exercise __________ times a day. Strengthening exercises Isometric upper cervical flexion Lie on your back with a thin pillow behind your head and a small rolled-up towel under your neck. Gently tuck your chin toward your chest and nod your head down to look toward your feet. Do not lift your head off the pillow. Hold for __________ seconds. Release  the tension slowly. Relax your neck muscles completely before you repeat this exercise. Repeat __________ times. Complete this exercise __________ times a day. Isometric cervical extension  Stand about 6  inches (15 cm) away from a wall, with your back facing the wall. Place a soft object, about 6-8 inches (15-20 cm) in diameter, between the back of your head and the wall. A soft object could be a small pillow, a ball, or a folded towel. Gently tilt your head back and press into the soft object. Keep your jaw and forehead relaxed. Hold for __________ seconds. Release the tension slowly. Relax your neck muscles completely before you repeat this exercise. Repeat __________ times. Complete this exercise __________ times a day. Posture and body mechanics Body mechanics refers to the movements and positions of your body while you do your daily activities. Posture is part of body mechanics. Good posture and healthy body mechanics can help to relieve stress in your body's tissues and joints. Good posture means that your spine is in its natural S-curve position (your spine is neutral), your shoulders are pulled back slightly, and your head is not tipped forward. The following are general guidelines for applying improved posture and body mechanics to your everyday activities. Sitting  When sitting, keep your spine neutral and keep your feet flat on the floor. Use a footrest, if necessary, and keep your thighs parallel to the floor. Avoid rounding your shoulders, and avoid tilting your head forward. When working at a desk or a computer, keep your desk at a height where your hands are slightly lower than your elbows. Slide your chair under your desk so you are close enough to maintain good posture. When working at a computer, place your monitor at a height where you are looking straight ahead and you do not have to tilt your head forward or downward to look at the screen. Standing  When standing, keep your  spine neutral and keep your feet about hip-width apart. Keep a slight bend in your knees. Your ears, shoulders, and hips should line up. When you do a task in which you stand in one place for a long time, place one foot up on a stable object that is 2-4 inches (5-10 cm) high, such as a footstool. This helps keep your spine neutral. Resting When lying down and resting, avoid positions that are most painful for you. Try to support your neck in a neutral position. You can use a contour pillow or a small rolled-up towel. Your pillow should support your neck but not push on it. This information is not intended to replace advice given to you by your health care provider. Make sure you discuss any questions you have with your health care provider. Document Revised: 11/10/2018 Document Reviewed: 04/21/2018 Elsevier Patient Education  [place].

## 2021-09-16 ENCOUNTER — Other Ambulatory Visit: Payer: Self-pay

## 2021-09-16 ENCOUNTER — Encounter (HOSPITAL_COMMUNITY): Payer: Self-pay

## 2021-09-16 ENCOUNTER — Ambulatory Visit (HOSPITAL_COMMUNITY)
Admission: RE | Admit: 2021-09-16 | Discharge: 2021-09-16 | Disposition: A | Discharge status: Home / Self Care | Payer: Medicare Other | Source: Ambulatory Visit | Attending: Gastroenterology | Admitting: Gastroenterology

## 2021-09-16 DIAGNOSIS — E119 Type 2 diabetes mellitus without complications: Secondary | ICD-10-CM | POA: Insufficient documentation

## 2021-09-16 DIAGNOSIS — Z859 Personal history of malignant neoplasm, unspecified: Secondary | ICD-10-CM | POA: Insufficient documentation

## 2021-09-16 DIAGNOSIS — R7989 Other specified abnormal findings of blood chemistry: Secondary | ICD-10-CM | POA: Diagnosis not present

## 2021-09-16 DIAGNOSIS — Z87442 Personal history of urinary calculi: Secondary | ICD-10-CM | POA: Insufficient documentation

## 2021-09-16 DIAGNOSIS — Z9049 Acquired absence of other specified parts of digestive tract: Secondary | ICD-10-CM | POA: Diagnosis not present

## 2021-09-16 DIAGNOSIS — Z9081 Acquired absence of spleen: Secondary | ICD-10-CM | POA: Diagnosis not present

## 2021-09-16 DIAGNOSIS — I8500 Esophageal varices without bleeding: Secondary | ICD-10-CM | POA: Diagnosis not present

## 2021-09-16 DIAGNOSIS — Z905 Acquired absence of kidney: Secondary | ICD-10-CM | POA: Insufficient documentation

## 2021-09-16 DIAGNOSIS — M1990 Unspecified osteoarthritis, unspecified site: Secondary | ICD-10-CM | POA: Insufficient documentation

## 2021-09-16 DIAGNOSIS — I10 Essential (primary) hypertension: Secondary | ICD-10-CM | POA: Insufficient documentation

## 2021-09-16 DIAGNOSIS — K635 Polyp of colon: Secondary | ICD-10-CM | POA: Insufficient documentation

## 2021-09-16 DIAGNOSIS — Z90410 Acquired total absence of pancreas: Secondary | ICD-10-CM | POA: Insufficient documentation

## 2021-09-16 DIAGNOSIS — N289 Disorder of kidney and ureter, unspecified: Secondary | ICD-10-CM | POA: Insufficient documentation

## 2021-09-16 DIAGNOSIS — R748 Abnormal levels of other serum enzymes: Secondary | ICD-10-CM | POA: Diagnosis present

## 2021-09-16 DIAGNOSIS — F32A Depression, unspecified: Secondary | ICD-10-CM | POA: Insufficient documentation

## 2021-09-16 DIAGNOSIS — E785 Hyperlipidemia, unspecified: Secondary | ICD-10-CM | POA: Diagnosis not present

## 2021-09-16 DIAGNOSIS — Z85528 Personal history of other malignant neoplasm of kidney: Secondary | ICD-10-CM | POA: Diagnosis not present

## 2021-09-16 DIAGNOSIS — K219 Gastro-esophageal reflux disease without esophagitis: Secondary | ICD-10-CM | POA: Insufficient documentation

## 2021-09-16 DIAGNOSIS — R911 Solitary pulmonary nodule: Secondary | ICD-10-CM | POA: Diagnosis not present

## 2021-09-16 DIAGNOSIS — I739 Peripheral vascular disease, unspecified: Secondary | ICD-10-CM | POA: Insufficient documentation

## 2021-09-16 DIAGNOSIS — M069 Rheumatoid arthritis, unspecified: Secondary | ICD-10-CM | POA: Insufficient documentation

## 2021-09-16 DIAGNOSIS — E890 Postprocedural hypothyroidism: Secondary | ICD-10-CM | POA: Diagnosis not present

## 2021-09-16 LAB — COMPREHENSIVE METABOLIC PANEL
ALT: 41 U/L (ref 0–44)
AST: 47 U/L — ABNORMAL HIGH (ref 15–41)
Albumin: 3.1 g/dL — ABNORMAL LOW (ref 3.5–5.0)
Alkaline Phosphatase: 143 U/L — ABNORMAL HIGH (ref 38–126)
Anion gap: 10 (ref 5–15)
BUN: 30 mg/dL — ABNORMAL HIGH (ref 8–23)
CO2: 26 mmol/L (ref 22–32)
Calcium: 8.7 mg/dL — ABNORMAL LOW (ref 8.9–10.3)
Chloride: 104 mmol/L (ref 98–111)
Creatinine, Ser: 2.5 mg/dL — ABNORMAL HIGH (ref 0.61–1.24)
GFR, Estimated: 27 mL/min — ABNORMAL LOW (ref >60)
Glucose, Bld: 207 mg/dL — ABNORMAL HIGH (ref 70–99)
Potassium: 4.2 mmol/L (ref 3.5–5.1)
Sodium: 140 mmol/L (ref 135–145)
Total Bilirubin: 0.9 mg/dL (ref 0.3–1.2)
Total Protein: 6.1 g/dL — ABNORMAL LOW (ref 6.5–8.1)

## 2021-09-16 LAB — CBC WITH DIFFERENTIAL/PLATELET
Abs Immature Granulocytes: 0.01 10*3/uL (ref 0.00–0.07)
Basophils Absolute: 0.1 10*3/uL (ref 0.0–0.1)
Basophils Relative: 1 %
Eosinophils Absolute: 0.9 10*3/uL — ABNORMAL HIGH (ref 0.0–0.5)
Eosinophils Relative: 10 %
HCT: 41.7 % (ref 39.0–52.0)
Hemoglobin: 13.4 g/dL (ref 13.0–17.0)
Immature Granulocytes: 0 %
Lymphocytes Relative: 28 %
Lymphs Abs: 2.4 10*3/uL (ref 0.7–4.0)
MCH: 34.1 pg — ABNORMAL HIGH (ref 26.0–34.0)
MCHC: 32.1 g/dL (ref 30.0–36.0)
MCV: 106.1 fL — ABNORMAL HIGH (ref 80.0–100.0)
Monocytes Absolute: 1.2 10*3/uL — ABNORMAL HIGH (ref 0.1–1.0)
Monocytes Relative: 14 %
Neutro Abs: 3.8 10*3/uL (ref 1.7–7.7)
Neutrophils Relative %: 47 %
Platelets: 234 10*3/uL (ref 150–400)
RBC: 3.93 MIL/uL — ABNORMAL LOW (ref 4.22–5.81)
RDW: 12.9 % (ref 11.5–15.5)
WBC: 8.4 10*3/uL (ref 4.0–10.5)
nRBC: 0 % (ref 0.0–0.2)

## 2021-09-16 LAB — GLUCOSE, CAPILLARY: Glucose-Capillary: 167 mg/dL — ABNORMAL HIGH (ref 70–99)

## 2021-09-16 LAB — PROTIME-INR
INR: 0.9 (ref 0.8–1.2)
Prothrombin Time: 12.2 seconds (ref 11.4–15.2)

## 2021-09-16 MED ORDER — LIDOCAINE HCL 1 % IJ SOLN
INTRAMUSCULAR | Status: AC
  Administered 2021-09-16: 5 mL
  Filled 2021-09-16: qty 20

## 2021-09-16 MED ORDER — HYDRALAZINE HCL 20 MG/ML IJ SOLN
INTRAMUSCULAR | Status: AC
  Administered 2021-09-16: 10 mg via INTRAVENOUS
  Filled 2021-09-16: qty 1

## 2021-09-16 MED ORDER — FENTANYL CITRATE (PF) 100 MCG/2ML IJ SOLN
INTRAMUSCULAR | Status: DC | PRN
  Administered 2021-09-16: 50 ug via INTRAVENOUS

## 2021-09-16 MED ORDER — FENTANYL CITRATE (PF) 100 MCG/2ML IJ SOLN
INTRAMUSCULAR | Status: AC
  Filled 2021-09-16: qty 2

## 2021-09-16 MED ORDER — MIDAZOLAM HCL 2 MG/2ML IJ SOLN
INTRAMUSCULAR | Status: AC
  Filled 2021-09-16: qty 2

## 2021-09-16 MED ORDER — HYDRALAZINE HCL 20 MG/ML IJ SOLN: 10.0000 mg | Freq: Once | INTRAMUSCULAR | Status: AC

## 2021-09-16 MED ORDER — MIDAZOLAM HCL 2 MG/2ML IJ SOLN
INTRAMUSCULAR | Status: DC | PRN
  Administered 2021-09-16: 1 mg via INTRAVENOUS

## 2021-09-16 MED ORDER — SODIUM CHLORIDE 0.9 % IV SOLN: INTRAVENOUS | Status: DC

## 2021-09-16 NOTE — H&P (Signed)
Referring Physician(s): Armbruster,Steven P  Supervising Physician: Ruthann Cancer  Patient Status:  [facility] OP  Chief Complaint: "I'm here again for a liver biopsy"   Subjective: Patient familiar to IR service from right lung nodule biopsy in 2016.  He has a significant past medical history including metastatic right renal cell carcinoma with prior right nephrectomy, Whipple (gastric resection, splenectomy , pancreatectomy ), cholecystectomy, diabetes, thyroidectomy due to metastatic disease, renal insufficiency, depression, hyperlipidemia, hypertension, hypothyroidism, nephrolithiasis, osteoarthritis, peripheral vascular disease, rheumatoid arthritis, GERD, colon polyps, and esophageal varices.  He also has persistently elevated liver function tests in addition  to high ferritin with high iron saturation.  He presented to [facility] on 08/22/2021 to undergo transjugular liver biopsy with pressure measurements ,however the procedure was technically challenging due to anatomy and inability to use contrast due to kidney disease.  They were unable to get adequate hepatic vein access.  He presents again today for image guided random core liver biopsy for further evaluation.  He currently denies fever, headache, chest pain, dyspnea, cough, abdominal pain, nausea, vomiting or bleeding.  He does have some low back pain.  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, Ultrasound positive synovitis    Kidney disease    Macular degeneration    Bilateral eyes   Nephrolithiasis    Osteoarthritis of both hands 05/28/2016   Osteoarthritis of both knees 05/28/2016   Peripheral vascular  disorder ([place]) 05/28/2016   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   Rheumatoid arthritis ([place])    Thyroid cancer ([place]) 05/28/2016   Past Surgical History:  Procedure Laterality Date   CATARACT EXTRACTION Bilateral 2013   CHOLECYSTECTOMY     GASTRECTOMY     tumor removed   GLAUCOMA SURGERY     Laser surgery   IR US GUIDE VASC ACCESS RIGHT  08/22/2021   IR VENOGRAM HEPATIC W HEMODYNAMIC EVALUATION  08/22/2021   LIPOMA EXCISION Right 1999   NEPHRECTOMY Right    PANCREATECTOMY     SPLENECTOMY     THYROIDECTOMY     URETERAL STENT PLACEMENT Left 2012   WHIPPLE PROCEDURE        Allergies: Iodine and Prozac [fluoxetine hcl]  Medications: Prior to Admission medications   Medication Sig Start Date End Date Taking? Authorizing Provider  aspirin 81 MG tablet Take 81 mg by mouth daily.     [provider]  atenolol (TENORMIN) 100 MG tablet Take 100 mg by mouth daily.  01/16/14   [provider]  augmented betamethasone dipropionate (DIPROLENE-AF) 0.05 % cream Apply 1 application topically daily as needed (rash).  03/05/15   [provider]  azelastine (OPTIVAR) 0.05 % ophthalmic solution Apply 1 drop to eye in the morning and at bedtime. As needed 12/11/20   [provider]  calcium citrate-vitamin D (CITRACAL+D) 315-200 MG-UNIT per tablet Take 1 tablet by mouth 2 (two) times daily.    [provider]  Cholecalciferol (VITAMIN D3) 50 MCG (2000 UT) TABS Take 1 tablet by mouth daily.    [provider]  Cyanocobalamin (VITAMIN B-12) 2500 MCG SUBL Take 2,500 mcg by mouth daily.     [provider]  diclofenac Sodium (VOLTAREN) 1 % GEL Apply 3 g topically 3 (three) times daily as needed (joint pain).  02/09/20   [provider]  dicyclomine (BENTYL) 10 MG capsule Take 1 capsule (10 mg total) by mouth every 8 (eight) hours as needed for spasms. 12/13/20   Armbruster, Carlota Raspberry, MD  docusate sodium (COLACE) 100 MG capsule Take 100 mg by mouth  2 (two) times daily as needed for mild constipation.     [provider]  dorzolamide-timolol (COSOPT) 22.3-6.8 MG/ML ophthalmic solution Place 1 drop into both eyes 2 (two) times daily.  11/08/15   [provider]  famotidine (PEPCID) 20 MG tablet Take 20 mg by mouth 3 (three) times daily.    [provider]  gabapentin (NEURONTIN) 300 MG capsule Take 300 mg by mouth as needed. 05/06/20   [provider]  Glucosamine-MSM-Hyaluronic Acd 750-375-30 MG TABS Take 2 tablets by mouth daily.    [provider]  hydrALAZINE (APRESOLINE) 25 MG tablet Take 25 mg by mouth 3 (three) times daily. 04/25/21   [provider]  HYDROcodone-acetaminophen (NORCO/VICODIN) 5-325 MG tablet Take 1 tablet by mouth every 6 (six) hours as needed. 10/15/20   [provider]  insulin lispro (HUMALOG) 100 UNIT/ML injection Inject 2-4 Units into the skin 3 (three) times daily before meals. Per sliding scale    [provider]  Lactobacillus (ACIDOPHILUS) CAPS capsule Take 1 capsule by mouth daily.    [provider]  latanoprost (XALATAN) 0.005 % ophthalmic solution Place 1 drop into both eyes at bedtime.  11/08/15   [provider]  LEVEMIR 100 UNIT/ML injection Inject 7 Units into the skin 2 (two) times daily. 03/05/15   [provider]  levothyroxine (SYNTHROID) 200 MCG tablet Take 200 mcg by mouth daily before breakfast. 11/02/20   [provider]  levothyroxine (SYNTHROID) 50 MCG tablet Take 50 mcg by mouth daily before breakfast.    [provider]  losartan (COZAAR) 25 MG tablet Take 25 mg by mouth daily. 08/28/20   [provider]  lovastatin (MEVACOR) 40 MG tablet Take 40 mg by mouth at bedtime.  01/18/14   [provider]  Omega-3 Fatty Acids (FISH OIL) 1200 MG CAPS Take 1 capsule by mouth daily.    [provider]  omeprazole (PRILOSEC) 20 MG capsule Take 1 capsule (20 mg total) by mouth  daily. 12/19/20   Armbruster, Carlota Raspberry, MD  Pancrelipase, Lip-Prot-Amyl, 6000-19000 units CPEP Take 4 capsules by mouth 3 (three) times daily.    [provider]  sildenafil (VIAGRA) 100 MG tablet Take 100 mg by mouth daily as needed. 06/02/21   [provider]  torsemide (DEMADEX) 20 MG tablet Take 20 mg by mouth 2 (two) times daily. 10/02/20   [provider]  traMADol (ULTRAM) 50 MG tablet Take 50 mg by mouth 2 (two) times daily as needed. 03/30/20   [provider]     Vital Signs: BP (!) 189/79 (BP Location: Right Arm)    Pulse (!) 42    Temp (!) 97.5 F (36.4 C) (Oral)    Resp 15    SpO2 99%   Physical Exam awake, alert.  Chest with sl diminished breath sounds bases.  Heart with bradycardic but regular rhythm.  Abdomen soft, positive bowel sounds, nontender.  No sig lower extremity edema.   Imaging: XR Cervical Spine 2 or 3 views  Result Date: 09/13/2021 Multilevel spondylosis of cervical spine was  noted with anterior and posterior osteophytes.  Most significant narrowing was noted between C5-C6 and C6-C7.  Facet joint arthropathy was noted. Impression: These findings are consistent with multilevel spondylosis and facet joint arthropathy.  XR Hand 2 View Left  Result Date: 09/13/2021 Severe CMC PIP and DIP narrowing was noted.  Third MCP narrowing was noted.  No intercarpal or radiocarpal joint space narrowing was noted. Impression: These findings are consistent with osteoarthritis of the hand.  XR Hand 2 View Right  Result Date: 09/13/2021 CMC, PIP and DIP narrowing was noted.  Third MCP narrowing was noted.  No intercarpal or radiocarpal joint space narrowing was noted.  Cystic changes were noted in the first DIP and ulnar styloid. Impression: These findings are consistent with osteoarthritis of the hand.  XR Lumbar Spine 2-3 Views  Result Date: 09/13/2021 No significant disc space narrowing was noted.  Facet joint arthropathy with foraminal  narrowing was noted.  Anterior osteophytes were noted. Impression: These findings are consistent with mild spondylosis and facet joint arthropathy.   Labs:  CBC: Recent Labs    03/28/21 0939 06/12/21 0944 08/22/21 0930 09/12/21 0919  WBC 7.4 6.0 9.0 8.8  HGB 14.2 13.3 13.0 13.0  HCT 42.7 40.5 40.0 40.3  PLT 209 187 231 230    COAGS: Recent Labs    08/22/21 0930  INR 0.9    BMP: Recent Labs    03/28/21 0939 06/12/21 0944 08/22/21 0930 09/12/21 0919  NA 138 138 138 139  K 4.8 3.7 4.6 4.0  CL 105 107 107 105  CO2 27 24 22 29   GLUCOSE 244* 213* 201* 111*  BUN 26* 36* 29* 39*  CALCIUM 8.2* 7.7* 7.6* 8.0*  CREATININE 2.32* 2.61* 2.61* 3.20*  GFRNONAA 29* 25* 25* 20*    LIVER FUNCTION TESTS: Recent Labs    03/28/21 0939 06/12/21 0944 08/22/21 0930 09/12/21 0919  BILITOT 0.8 0.6 1.1 0.5  AST 45* 62* 71* 36  ALT 41 62* 58* 31  ALKPHOS 181* 197* 140* 166*  PROT 5.1* 5.0* 5.3* 5.6*  ALBUMIN 2.3* 2.3* 2.7* 3.1*    Assessment and Plan: Patient familiar to IR service from right lung nodule biopsy in 2016.  He has a significant past medical history including metastatic right renal cell carcinoma with prior right nephrectomy, Whipple (gastric resection, splenectomy , pancreatectomy ), cholecystectomy, diabetes, thyroidectomy due to metastatic disease, renal insufficiency, depression, hyperlipidemia, hypertension, hypothyroidism, nephrolithiasis, osteoarthritis, peripheral vascular disease, rheumatoid arthritis, GERD, colon polyps, and esophageal varices.  He also has persistently elevated liver function tests in addition  to high ferritin with high iron saturation.  He presented to [facility] on 08/22/2021 to undergo transjugular liver biopsy with pressure measurements ,however the procedure was technically challenging due to anatomy and inability to use contrast due to kidney disease.  They were unable to get adequate hepatic vein access.  He presents again today  for image guided random core liver biopsy for further evaluation. Risks and benefits of procedure was discussed with the patient including, but not limited to bleeding, infection, damage to adjacent structures or low yield requiring additional tests.  All of the questions were answered and there is agreement to proceed.  Consent signed and in chart.  LABS PENDING  Electronically Signed: D. Rowe Robert, PA-C 09/16/2021, 11:37 AM   I spent a total of 20 minutes at the the patient's bedside AND on the patient's hospital floor or unit, greater than 50% of which was counseling/coordinating care for image guided  random core liver biopsy

## 2021-09-16 NOTE — Procedures (Signed)
Interventional Radiology Procedure Note  Procedure: Ultrasound guided non-focal liver biopsy  Findings: Please refer to procedural dictation for full description.18 ga core from right lobe x2.  Gelfoam needle track embolization.  Complications: None immediate  Estimated Blood Loss: < 5 mL  Recommendations: Strict 3 hour bedrest. Follow up Pathology results.   Ruthann Cancer, MD Pager: [phone]

## 2021-09-17 LAB — SURGICAL PATHOLOGY

## 2021-09-20 ENCOUNTER — Other Ambulatory Visit: Payer: Self-pay

## 2021-09-20 DIAGNOSIS — R748 Abnormal levels of other serum enzymes: Secondary | ICD-10-CM

## 2021-09-20 NOTE — Addendum Note (Signed)
Addended by: Berniece Salines A on: 09/20/2021 09:26 AM   Modules accepted: Orders

## 2021-09-23 ENCOUNTER — Other Ambulatory Visit: Payer: Medicare Other

## 2021-09-23 DIAGNOSIS — R748 Abnormal levels of other serum enzymes: Secondary | ICD-10-CM

## 2021-09-23 LAB — IRON,TIBC AND FERRITIN PANEL
%SAT: 79 % (calc) — ABNORMAL HIGH (ref 20–48)
Ferritin: 1103 ng/mL — ABNORMAL HIGH (ref 24–380)
Iron: 152 ug/dL (ref 50–180)
TIBC: 192 mcg/dL (calc) — ABNORMAL LOW (ref 250–425)

## 2021-09-29 NOTE — Addendum Note (Signed)
Addended by: Sullivan Lone on: 09/29/2021 02:57 PM   Modules accepted: Orders

## 2021-11-04 ENCOUNTER — Telehealth: Payer: Self-pay

## 2021-11-04 ENCOUNTER — Encounter: Payer: Self-pay | Admitting: Gastroenterology

## 2021-11-04 ENCOUNTER — Ambulatory Visit: Payer: Medicare Other | Admitting: Gastroenterology

## 2021-11-04 VITALS — BP 162/82 | HR 52 | Ht 69.69 in

## 2021-11-04 DIAGNOSIS — K7460 Unspecified cirrhosis of liver: Secondary | ICD-10-CM | POA: Diagnosis not present

## 2021-11-04 DIAGNOSIS — I8500 Esophageal varices without bleeding: Secondary | ICD-10-CM

## 2021-11-04 DIAGNOSIS — I8510 Secondary esophageal varices without bleeding: Secondary | ICD-10-CM

## 2021-11-04 NOTE — Progress Notes (Signed)
? ?HPI :  ?71 year old male here for reassessment of abnormal liver enzymes, suspected cirrhosis and hemochromatosis / iron overload. Recall he has a history of metastatic renal cell carcinoma, history of Whipple / gastric resection, diabetes. ?  ?See prior notes for full history. He has an extensive medical history to include renal cell carcinoma diagnosed over 20 years ago.  He had a right nephrectomy initially, years later had recurrence of disease in his abdomen including his pancreas, spleen and small intestine.  He had a resection of his pancreas, Whipple procedure, with part of his stomach and intestine/spleen removed.  Also cholecystectomy.  He is also had recurrence in his thyroid that led to thyroidectomy.  He has been on chemotherapy with Dr. Irene Limbo, namely Sunitinib for the past 5 to 6 years.  He had been off this drug for the past several months however due to elevated liver enzymes. ?  ?Previously he was complaining of left upper side abdominal pain that led to a work-up with CT scan, EGD, colonoscopy without clear cause.  Empiric PPI resolved the symptoms for him and he has not had any recurrence since.  He continues to take the PPI as he feels well on it.  Recall that ?EGD incidentally showed some very small esophageal varices.  He had some postsurgical anatomy appreciated with some erythema in his stomach.  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  Colonoscopy showed a few polyps in his colon but there was no abnormality in the right colon that was previously seen on a CT scan (previously had right-sided colonic wall thickening). The small bowel fistula was not seen on prior CT scan.  He underwent an upper GI series which also did not show any evidence of a fistula. ?  ?Otherwise recall that the patient has had elevation in his liver enzymes since November 2021 or so.  Alkaline phosphatase has ranged from 130s to 200s, ALT 50s to  70s, AST 40s to 50s.  Total bilirubin normal.  He has an occasional beer but no routine alcohol use.  It was thought that this initially was related to sunitinib use, and it was held but his liver enzymes have unfortunately persisted to this degree of elevation.  Imaging with another CT scan of his chest abdomen pelvis which showed no abnormalities in his liver, although the exam was without IV contrast.  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   ? ?At his last visit in January I recommended a transjugular liver biopsy to evaluate for portal hypertension, clarify if he has cirrhosis, and clarify etiology for his elevated liver enzymes.  Unfortunately due to his altered anatomy IR was unable to get a good transjugular liver biopsy, core pressures were not reliable, procedure was aborted.  He then was subsequently referred for percutaneous liver biopsy.  He had this done February 13 as outlined ? ?FINAL MICROSCOPIC DIAGNOSIS:  ? ?A. 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 ? ?We had discussed the results of his liver biopsy.  He has not yet followed up to see Dr. Irene Limbo.  He has had no history of decompensations of his liver disease  to date.  No lower extremity edema or ascites.  No history of hepatic encephalopathy.  No jaundice.  He has never had bleeding from varices.  He does take atenolol 100 mg daily.  He is not taking any iron supplement either p.o. or IV anytime recently.  He is not taking any vitamin C supplements etc.  He denies any known family history of hemochromatosis.  He does have 1 daughter.  We discussed options moving forward.  He is otherwise feeling okay today.  He is due for repeat imaging with Dr. Irene Limbo for history of  his malignancy later this month.  Of note he has been immunized hepatitis A and B. ? ?Prior work-up: ?Echocardiogram 03/20/20 - EF 60-65%, moderate LVH,  ?  ?Colonoscopy 04/12/2014 - Dr. Deatra Ina -  good prep, normal exam ?   ?EGD 12/19/20 -  ?- Suspected small varices were found in the middle third of the esophagus and in the lower ?third of the esophagus. They were small in size without any high risk stigmata for bleeding. ?- The exam of the esophagus was otherwise normal. ?- Evidence of a gastroenterostomy was found in the gastric body. Anastomosis was ?characterized by healthy appearing mucosa. ?- Diffuse erythematous mucosa was found in the gastric pouch. Biopsies were taken with a ?cold forceps for Helicobacter pylori testing. ?- The exam of the stomach remnant was otherwise normal. Gastric remnant is small. ?- A suspected fistula was found in the small bowel limb. I could not traverse it with the upper ?endoscope but could see through it and it appeared to be normal appearing bowel. ?- Exam of the small bowel was otherwise normal, it was deeply intubated. ?  ?Colonoscopy 12/19/20 - The perianal and digital rectal examinations were normal. ?- The terminal ileum appeared normal. ?- The mucosa of the cecum was slightly altered with textural change. Clear borders not ?identified. I suspect normal variant but biopsies were taken with a cold forceps for histology ?to ensure no flat adenoma. ?- Two sessile polyps were found in the transverse colon. The polyps were 3 to 5 mm in size. ?These polyps were removed with a cold snare. Resection and retrieval were complete. ?- A 4 mm polyp was found in the descending colon. The polyp was sessile. The polyp was ?removed with a cold snare. Resection and retrieval were complete. ?- Four sessile polyps were found in the sigmoid colon. The polyps were 4 to 5 mm in size. ?These polyps were removed with a cold snare. Resection and retrieval were complete. ?- Internal hemorrhoids were  found during retroflexion. ?- The colon was spastic and tortous. The exam was otherwise without abnormality. ?   ?1. Surgical [P], gastric pouch bxs ?- MILD CHRONIC GASTRITIS. ?- WARTHIN-STARRY STAIN IS NEGATIVE FOR HELICOBACTER PYLORI. ?2. Surgical [P], colon, transverse x2, sigmoid x3, descending x1, polyp (6) ?- TUBULAR ADENOMA (X MULTIPLE). ?- NEGATIVE FOR HIGH GRADE DYSPLASIA. ?3. Surgical [P], colon, cecum (atypical mucosa) biopsies ?- COLONIC MUCOSA WITH EDEMA. ?- NEGATIVE FOR DYSPLASIA. ?  ?UGI series 01/14/21 - IMPRESSION: ?Gastric fold thickening, also present on the prior CT of 12/24/2020. ?Findings are nonspecific, but may reflect gastritis. Correlate with ?findings on recent biopsies. ?The terminal ileum is somewhat poorly delineated due to overlapping ?small bowel loops (despite compression). Within this limitation, ?otherwise unremarkable upper GI series and small-bowel ?follow-through in this patient status post remote Whipple procedure. ?No definite small bowel fistula is identified ?  ?CT chest / abdomen / pelvis without contrast 06/14/21: ?Hepatobiliary:  Unenhanced liver is unremarkable. Calcified granuloma ?in the posterior right hepatic lobe. ?Status post cholecystectomy. No intrahepatic or extrahepatic ductal ?dilatation. ?Pancreas: Surgically absent. ?Spleen: Surgically absent. ?  ?IMPRESSION: ?Status post right nephrectomy. No evidence of recurrent or ?metastatic disease. ?  ?Radiation changes in the right lung. Associated pathologic fracture ?of the right 4th rib. ?  ?Postsurgical changes related to Whipple procedure with ?pancreatectomy and splenectomy. ?  ?Mild superior endplate changes at L3, new/progressive. No ?retropulsion. ?  ?Additional ancillary findings as above. ?  ?   ? ?Liver biopsy 09/16/21: ? ?FINAL MICROSCOPIC DIAGNOSIS:  ? ?A. 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 ?COMMENT:  ? ?The biopsy shows liver parenchyma with a distorted architecture.  ?Trichrome and reticulin stains show bridging fibrous septa and focal

## 2021-11-04 NOTE — Telephone Encounter (Signed)
Left a detailed vm for Dr. Shon Baton nurse with the information below. I asked that she give [place] a call back to discuss or fax response.  ?

## 2021-11-04 NOTE — Patient Instructions (Addendum)
If you are age 72 or older, your body mass index should be between 23-30. Your Body mass index is 26.29 kg/m?Marland Kitchen If this is out of the aforementioned range listed, please consider follow up with your Primary Care Provider. ? ?If you are age 61 or younger, your body mass index should be between 19-25. Your Body mass index is 26.29 kg/m?Marland Kitchen If this is out of the aformentioned range listed, please consider follow up with your Primary Care Provider.  ? ?________________________________________________________ ? ?The [facility] providers would like to encourage you to use [facility] to communicate with providers for non-urgent requests or questions.  Due to long hold times on the telephone, sending your provider a message by [facility] may be a faster and more efficient way to get a response.  Please allow 48 business hours for a response.  Please remember that this is for non-urgent requests.  ?_______________________________________________________ ? ?Please follow up with Dr. Irene Limbo for possible phlebotomy. ? ?Please follow up in 6 months. ? ?Thank you for entrusting me with your care and for choosing [employer], ?Dr. [place] Cellar ? ? ? ? ? ? ? ? ?

## 2021-11-04 NOTE — Telephone Encounter (Signed)
-----   Message from Yetta Flock, MD sent at 11/04/2021 12:57 PM EDT ----- ?Regarding: question for Dr. Philip Aspen ?Hi Jerrod Damiano, was wondering if you could reach out to Dr. Quillian Quince Paterson's office, the PCP for this patient, to ask if it would be okay to switch his atenolol to Coreg to manage his esophageal varices.  I thought I could send a staff message through epic, but his office does not use epic.  Thanks for your help with this. ? ?Dr. Havery Moros ? ?

## 2021-11-06 MED ORDER — CARVEDILOL 12.5 MG PO TABS: 12.5000 mg | ORAL_TABLET | Freq: Two times a day (BID) | ORAL | 5 refills | Status: DC

## 2021-11-06 NOTE — Telephone Encounter (Signed)
Appreciate the follow up, thanks very much. ?

## 2021-11-06 NOTE — Telephone Encounter (Signed)
Received a fax from Dr. Shon Baton office. Per Dr. Philip Aspen OK to change atenolol to Carvedilol 12.5 mg BID with food. They have notified the pt of medication change and they have sent the prescription in for the patient.  A copy of this correspondence has been placed in your office and a copy has been sent to be scanned into patient's chart.  ?

## 2021-11-18 ENCOUNTER — Inpatient Hospital Stay: Payer: Medicare Other | Attending: Hematology

## 2021-11-18 DIAGNOSIS — C7802 Secondary malignant neoplasm of left lung: Secondary | ICD-10-CM | POA: Diagnosis not present

## 2021-11-18 DIAGNOSIS — C7801 Secondary malignant neoplasm of right lung: Secondary | ICD-10-CM | POA: Diagnosis not present

## 2021-11-18 DIAGNOSIS — C649 Malignant neoplasm of unspecified kidney, except renal pelvis: Secondary | ICD-10-CM | POA: Diagnosis present

## 2021-11-18 LAB — CBC WITH DIFFERENTIAL/PLATELET
Abs Immature Granulocytes: 0.02 10*3/uL (ref 0.00–0.07)
Basophils Absolute: 0.1 10*3/uL (ref 0.0–0.1)
Basophils Relative: 2 %
Eosinophils Absolute: 0.2 10*3/uL (ref 0.0–0.5)
Eosinophils Relative: 4 %
HCT: 40.3 % (ref 39.0–52.0)
Hemoglobin: 12.8 g/dL — ABNORMAL LOW (ref 13.0–17.0)
Immature Granulocytes: 0 %
Lymphocytes Relative: 29 %
Lymphs Abs: 2 10*3/uL (ref 0.7–4.0)
MCH: 33.6 pg (ref 26.0–34.0)
MCHC: 31.8 g/dL (ref 30.0–36.0)
MCV: 105.8 fL — ABNORMAL HIGH (ref 80.0–100.0)
Monocytes Absolute: 0.9 10*3/uL (ref 0.1–1.0)
Monocytes Relative: 14 %
Neutro Abs: 3.5 10*3/uL (ref 1.7–7.7)
Neutrophils Relative %: 51 %
Platelets: 193 10*3/uL (ref 150–400)
RBC: 3.81 MIL/uL — ABNORMAL LOW (ref 4.22–5.81)
RDW: 12.6 % (ref 11.5–15.5)
WBC: 6.7 10*3/uL (ref 4.0–10.5)
nRBC: 0 % (ref 0.0–0.2)

## 2021-11-18 LAB — CMP (CANCER CENTER ONLY)
ALT: 44 U/L (ref 0–44)
AST: 43 U/L — ABNORMAL HIGH (ref 15–41)
Albumin: 3.2 g/dL — ABNORMAL LOW (ref 3.5–5.0)
Alkaline Phosphatase: 169 U/L — ABNORMAL HIGH (ref 38–126)
Anion gap: 3 — ABNORMAL LOW (ref 5–15)
BUN: 25 mg/dL — ABNORMAL HIGH (ref 8–23)
CO2: 28 mmol/L (ref 22–32)
Calcium: 7.8 mg/dL — ABNORMAL LOW (ref 8.9–10.3)
Chloride: 109 mmol/L (ref 98–111)
Creatinine: 3.13 mg/dL (ref 0.61–1.24)
GFR, Estimated: 20 mL/min — ABNORMAL LOW (ref >60)
Glucose, Bld: 99 mg/dL (ref 70–99)
Potassium: 3.8 mmol/L (ref 3.5–5.1)
Sodium: 140 mmol/L (ref 135–145)
Total Bilirubin: 0.7 mg/dL (ref 0.3–1.2)
Total Protein: 5.6 g/dL — ABNORMAL LOW (ref 6.5–8.1)

## 2021-11-18 LAB — LACTATE DEHYDROGENASE: LDH: 237 U/L — ABNORMAL HIGH (ref 98–192)

## 2021-11-25 ENCOUNTER — Other Ambulatory Visit: Payer: Medicare Other

## 2021-11-25 ENCOUNTER — Ambulatory Visit (HOSPITAL_COMMUNITY)
Admission: RE | Admit: 2021-11-25 | Discharge: 2021-11-25 | Disposition: A | Discharge status: Home / Self Care | Payer: Medicare Other | Source: Ambulatory Visit | Attending: Hematology | Admitting: Hematology

## 2021-11-25 DIAGNOSIS — Z9081 Acquired absence of spleen: Secondary | ICD-10-CM | POA: Insufficient documentation

## 2021-11-25 DIAGNOSIS — Z90411 Acquired partial absence of pancreas: Secondary | ICD-10-CM | POA: Diagnosis not present

## 2021-11-25 DIAGNOSIS — J984 Other disorders of lung: Secondary | ICD-10-CM | POA: Insufficient documentation

## 2021-11-25 DIAGNOSIS — Z905 Acquired absence of kidney: Secondary | ICD-10-CM | POA: Diagnosis not present

## 2021-11-25 DIAGNOSIS — C649 Malignant neoplasm of unspecified kidney, except renal pelvis: Secondary | ICD-10-CM | POA: Diagnosis present

## 2021-11-25 DIAGNOSIS — I700 Atherosclerosis of aorta: Secondary | ICD-10-CM | POA: Diagnosis not present

## 2021-11-25 DIAGNOSIS — N200 Calculus of kidney: Secondary | ICD-10-CM | POA: Insufficient documentation

## 2021-12-05 ENCOUNTER — Inpatient Hospital Stay: Payer: Medicare Other | Attending: Hematology | Admitting: Hematology

## 2021-12-05 ENCOUNTER — Other Ambulatory Visit: Payer: Medicare Other

## 2021-12-05 ENCOUNTER — Other Ambulatory Visit: Payer: Self-pay

## 2021-12-05 VITALS — BP 175/74 | HR 73 | Temp 97.9°F | Resp 20 | Wt 175.8 lb

## 2021-12-05 DIAGNOSIS — E119 Type 2 diabetes mellitus without complications: Secondary | ICD-10-CM | POA: Insufficient documentation

## 2021-12-05 DIAGNOSIS — C649 Malignant neoplasm of unspecified kidney, except renal pelvis: Secondary | ICD-10-CM

## 2021-12-11 ENCOUNTER — Encounter: Payer: Self-pay | Admitting: Hematology

## 2021-12-11 NOTE — Progress Notes (Signed)
? ?HEMATOLOGY ONCOLOGY PROGRESS NOTE ? ?Date of service: 12/11/21   ? ?Patient Care Team: ?[name], MD as PCP - General (Internal Medicine) ?Bo Merino, MD as Consulting Physician (Rheumatology) ? ?Chief complaint ?Follow-up for continued evaluation and management of metastatic renal cell carcinoma ? 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 ?2) Remote history of metastatic renal cell carcinoma ?Diagnosed with renal cell carcinoma 20 years ago and had a right nephrectomy. About 8 years after that he was noted to have abdominal recurrence in his pancreas spleen and small intestine and had a Whipple's procedure and significant abdominal surgery and notes that 10 out of 17 lymph nodes were positive. He also had his gallbladder removed. Postoperative course was complicated by an internal hemorrhage as per his report. ?Patient notes 2-3 years after that he had recurrence in his thyroid that led to a thyroidectomy. [June 2004] ?later he had partial gastrectomy for local recurrence. [February 2005] when he presented with GI bleeding. ?Patient notes that he has had no known evidence of recurrence over the last 8 years until his recent CT scan showed lung nodules. ? ?Current Treatment: Sutent 37.5 mg po daily for 2 weeks on and 1 weeks off. (on hold now from 10/2020) ?Previous treatment ?Sutent on cycle 1 - 50 mg by mouth daily for 4 weeks on and 2-weeks off. It was dose adjusted to 37.5 mg by mouth daily for 2 weeks with 1 week of to help mitigate issues with fatigue, mild hyperbilirubinemia, cytopenias. ?SBRT to dominant RML nodule. ? ? ?INTERVAL HISTORY: ? ?Mr. Assefa was seen in follow-up for his metastatic renal cell carcinoma.  He notes that he has felt much more fatigued and has lost about 8 to 10 pounds and appears quite dehydrated.  We discussed that he should hold his diuretics and hydrate well and reconnect with his  primary care physician/nephrologist for adjustment of his diabetes medications and diuretics. ?He had a CT chest abdomen pelvis on 11/25/2021 which was discussed in detail with the patient and shows no evidence of recurrent/progressive renal cell carcinoma. ?Labs done today were discussed in detail with the patient. ? ? ? ?Past Medical History:  ?Diagnosis Date  ? Arthritis   ? Cancer [facility])   ? Renal cell  ? DDD (degenerative disc disease), cervical 05/28/2016  ? DDD (degenerative disc disease), lumbar 05/28/2016  ? Depression   ? Diabetes ([place])   ? Hyperlipidemia 05/28/2016  ? Hypertension   ? Hypothyroidism   ? Inflammatory polyarthritis ([place]) 05/28/2016  ? Sero Negative, Ultrasound positive synovitis   ? Kidney disease   ? Macular degeneration   ? Bilateral eyes  ? Nephrolithiasis   ? Osteoarthritis of both hands 05/28/2016  ? Osteoarthritis of both knees 05/28/2016  ? Peripheral vascular disorder ([place]) 05/28/2016  ? 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  ? Rheumatoid arthritis ([place])   ? Thyroid cancer ([place]) 05/28/2016  ? ? ?. ?Past Surgical History:  ?Procedure Laterality Date  ? CATARACT EXTRACTION Bilateral 2013  ? CHOLECYSTECTOMY    ? GASTRECTOMY    ? tumor removed  ? GLAUCOMA SURGERY    ? Laser surgery  ? IR US GUIDE VASC ACCESS RIGHT  08/22/2021  ? IR VENOGRAM HEPATIC W HEMODYNAMIC EVALUATION  08/22/2021  ? LIPOMA EXCISION Right 1999  ? NEPHRECTOMY Right   ? PANCREATECTOMY    ? SPLENECTOMY    ? THYROIDECTOMY    ? URETERAL STENT PLACEMENT Left 2012  ? WHIPPLE  PROCEDURE    ? ? ?. ?Social History  ? ?Tobacco Use  ? Smoking status: Former  ?  Years: 1.50  ?  Types: Cigarettes  ? Smokeless tobacco: Never  ?Vaping Use  ? Vaping Use: Never used  ?Substance Use Topics  ? Alcohol use: Yes  ?  Comment: occasional beer  ? Drug use: No  ? ? ?ALLERGIES:  is allergic to iodine and prozac [fluoxetine hcl]. ? ?MEDICATIONS:  ?Current Outpatient Medications  ?Medication Sig Dispense Refill  ? aspirin 81 MG tablet Take 81 mg by mouth  daily.     ? atenolol (TENORMIN) 50 MG tablet 50 mg.    ? augmented betamethasone dipropionate (DIPROLENE-AF) 0.05 % cream Apply 1 application topically daily as needed (rash).   0  ? azelastine (OPTIVAR) 0.05 % ophthalmic solution Apply 1 drop to eye in the morning and at bedtime. As needed    ? calcium citrate-vitamin D (CITRACAL+D) 315-200 MG-UNIT per tablet Take 1 tablet by mouth 2 (two) times daily.    ? Cholecalciferol (VITAMIN D3) 50 MCG (2000 UT) TABS Take 1 tablet by mouth daily.    ? Cyanocobalamin (VITAMIN B-12) 2500 MCG SUBL Take 2,500 mcg by mouth daily.     ? diclofenac Sodium (VOLTAREN) 1 % GEL Apply 3 g topically 3 (three) times daily as needed (joint pain).     ? dicyclomine (BENTYL) 10 MG capsule Take 1 capsule (10 mg total) by mouth every 8 (eight) hours as needed for spasms. 30 capsule 1  ? docusate sodium (COLACE) 100 MG capsule Take 100 mg by mouth 2 (two) times daily as needed for mild constipation.     ? dorzolamide-timolol (COSOPT) 22.3-6.8 MG/ML ophthalmic solution Place 1 drop into both eyes 2 (two) times daily.   1  ? famotidine (PEPCID) 20 MG tablet Take 20 mg by mouth 3 (three) times daily.    ? gabapentin (NEURONTIN) 300 MG capsule Take 300 mg by mouth as needed.    ? Glucosamine-MSM-Hyaluronic Acd 750-375-30 MG TABS Take 2 tablets by mouth daily.    ? hydrALAZINE (APRESOLINE) 25 MG tablet Take 50 mg by mouth 3 (three) times daily.    ? HYDROcodone-acetaminophen (NORCO/VICODIN) 5-325 MG tablet Take 1 tablet by mouth every 6 (six) hours as needed.    ? insulin lispro (HUMALOG) 100 UNIT/ML injection Inject 2-4 Units into the skin 3 (three) times daily before meals. Per sliding scale    ? Lactobacillus (ACIDOPHILUS) CAPS capsule Take 1 capsule by mouth daily.    ? latanoprost (XALATAN) 0.005 % ophthalmic solution Place 1 drop into both eyes at bedtime.   1  ? LEVEMIR 100 UNIT/ML injection Inject 7 Units into the skin 2 (two) times daily.  1  ? levothyroxine (SYNTHROID) 125 MCG tablet Take  250 mcg by mouth daily before breakfast.    ? losartan (COZAAR) 25 MG tablet Take 25 mg by mouth daily.    ? lovastatin (MEVACOR) 40 MG tablet Take 40 mg by mouth at bedtime.     ? Multiple Vitamins-Minerals (PRESERVISION AREDS 2) CAPS Take 1 capsule by mouth 2 (two) times daily.    ? Omega-3 Fatty Acids (FISH OIL) 1200 MG CAPS Take 1 capsule by mouth daily.    ? omeprazole (PRILOSEC) 20 MG capsule Take 1 capsule (20 mg total) by mouth daily. 30 capsule 3  ? Pancrelipase, Lip-Prot-Amyl, 6000-19000 units CPEP Take 4 capsules by mouth 3 (three) times daily.    ? sildenafil (VIAGRA) 100 MG  tablet Take 100 mg by mouth daily as needed.    ? torsemide (DEMADEX) 20 MG tablet Take 20 mg by mouth 2 (two) times daily.    ? traMADol (ULTRAM) 50 MG tablet Take 50 mg by mouth 2 (two) times daily as needed.    ? carvedilol (COREG) 12.5 MG tablet Take 1 tablet (12.5 mg total) by mouth 2 (two) times daily with a meal. 60 tablet 5  ? ?No current facility-administered medications for this visit.  ? ? ?REVIEW OF SYSTEMS:   ?10 Point review of Systems was done is negative except as noted above. ? ?PHYSICAL EXAMINATION: ?ECOG FS:1 - Symptomatic but completely ambulatory ? ?Vitals:  ? 12/05/21 1200  ?BP: (!) 175/74  ?Pulse: 73  ?Resp: 20  ?Temp: 97.9 ?F (36.6 ?C)  ?SpO2: 100%  ? ?Wt Readings from Last 3 Encounters:  ?12/05/21 175 lb 12.8 oz (79.7 kg)  ?09/13/21 181 lb 9.6 oz (82.4 kg)  ?09/12/21 177 lb 11.2 oz (80.6 kg)  ? ?Body mass index is 25.45 kg/m?.     ? ?Telehealth Visit. ? ?LABORATORY DATA:  ? ?I have reviewed the data as listed ? ?. ? ?  Latest Ref Rng & Units 11/18/2021  ? 11:28 AM 09/16/2021  ? 11:51 AM 09/12/2021  ?  9:19 AM  ?CBC  ?WBC 4.0 - 10.5 K/uL 6.7   8.4   8.8    ?Hemoglobin 13.0 - 17.0 g/dL 12.8   13.4   13.0    ?Hematocrit 39.0 - 52.0 % 40.3   41.7   40.3    ?Platelets 150 - 400 K/uL 193   234   230    ? ?Marland Kitchen ?CBC ?   ?Component Value Date/Time  ? WBC 6.7 11/18/2021 1128  ? RBC 3.81 (L) 11/18/2021 1128  ? HGB 12.8 (L)  11/18/2021 1128  ? HGB 13.0 09/12/2021 0919  ? HGB 14.0 06/11/2017 0808  ? HCT 40.3 11/18/2021 1128  ? HCT 40.7 06/11/2017 0808  ? PLT 193 11/18/2021 1128  ? PLT 230 09/12/2021 0919  ? PLT 223 06/11/2017 0

## 2021-12-18 ENCOUNTER — Other Ambulatory Visit: Payer: Self-pay

## 2021-12-31 ENCOUNTER — Other Ambulatory Visit: Payer: Self-pay

## 2021-12-31 DIAGNOSIS — C649 Malignant neoplasm of unspecified kidney, except renal pelvis: Secondary | ICD-10-CM

## 2022-01-01 NOTE — Progress Notes (Signed)
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 2) Remote history of metastatic renal cell carcinoma Diagnosed with renal cell carcinoma 20 years ago and had a right nephrectomy. About 8 years after that he was noted to have abdominal recurrence in his pancreas spleen and small intestine and had a Whipple's procedure and significant abdominal surgery and notes that 10 out of 17 lymph nodes were positive. He also had his gallbladder removed. Postoperative course was complicated by an internal hemorrhage as per his report. Patient notes 2-3 years after that he had recurrence in his thyroid that led to a thyroidectomy. [June 2004] later he had partial gastrectomy for local recurrence. [February 2005] when he presented with GI bleeding. Patient notes that he has had no known evidence of recurrence over the last 8 years until his recent CT scan showed lung nodules.  Current Treatment: Sutent 37.5 mg po daily for 2 weeks on and 1 weeks off. (on hold now from 10/2020) Previous treatment Sutent on cycle 1 - 50 mg by mouth daily for 4 weeks on and 2-weeks off. It was dose adjusted to 37.5 mg by mouth daily for 2 weeks with 1 week of to help mitigate issues with fatigue, mild hyperbilirubinemia, cytopenias. SBRT to dominant RML nodule.   INTERVAL HISTORY:  Mr. Tufo is a 71 y.o. male here for follow-up for his metastatic renal cell carcinoma. Patient notes that he is eating better and has been drinking more water and his weight is back to his baseline dry weight and his dehydration seems to have been corrected.  He notes  his weakness and fatigue have significantly improved with correction of dehydration. No new acute focal symptoms.  No fevers no chills no night sweats.  No chest pain or shortness of breath. No unexpected weight loss. No new abdominal pain or distention.  Improved appetite. Labs done today were discussed in detail with the patient.  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 Past Surgical History:  Procedure Laterality Date   CATARACT EXTRACTION Bilateral 2013   CHOLECYSTECTOMY     GASTRECTOMY     tumor removed   GLAUCOMA SURGERY     Laser surgery   IR US GUIDE VASC ACCESS RIGHT  08/22/2021   IR VENOGRAM HEPATIC W HEMODYNAMIC EVALUATION  08/22/2021   LIPOMA EXCISION Right 1999   NEPHRECTOMY Right    PANCREATECTOMY     SPLENECTOMY     THYROIDECTOMY     URETERAL STENT  PLACEMENT Left 2012   WHIPPLE PROCEDURE      . Social History   Tobacco Use   Smoking status: Former    Years: 1.50    Types: Cigarettes   Smokeless tobacco: Never  Vaping Use   Vaping Use: Never used  Substance Use Topics   Alcohol use: Yes    Comment: occasional beer   Drug use: No    ALLERGIES:  is allergic to iodine and prozac [fluoxetine hcl].  MEDICATIONS:  Current Outpatient Medications  Medication Sig Dispense Refill   aspirin 81 MG tablet Take  81 mg by mouth daily.      atenolol (TENORMIN) 50 MG tablet Take 50 mg by mouth 2 (two) times daily.     augmented betamethasone dipropionate (DIPROLENE-AF) 0.05 % cream Apply 1 application topically daily as needed (rash).   0   azelastine (OPTIVAR) 0.05 % ophthalmic solution Apply 1 drop to eye in the morning and at bedtime. As needed     calcium citrate-vitamin D (CITRACAL+D) 315-200 MG-UNIT per tablet Take 1 tablet by mouth 2 (two) times daily.     carvedilol (COREG) 12.5 MG tablet Take 1 tablet (12.5 mg total) by mouth 2 (two) times daily with a meal. 60 tablet 5   Cholecalciferol (VITAMIN D3) 50 MCG (2000 UT) TABS Take 1 tablet by mouth daily.     Cyanocobalamin (VITAMIN B-12) 2500 MCG SUBL Take 2,500 mcg by mouth daily.      diclofenac Sodium (VOLTAREN) 1 % GEL Apply 3 g topically 3 (three) times daily as needed (joint pain).      dicyclomine (BENTYL) 10 MG capsule Take 1 capsule (10 mg total) by mouth every 8 (eight) hours as needed for spasms. 30 capsule 1   docusate sodium (COLACE) 100 MG capsule Take 100 mg by mouth 2 (two) times daily as needed for mild constipation.      dorzolamide-timolol (COSOPT) 22.3-6.8 MG/ML ophthalmic solution Place 1 drop into both eyes 2 (two) times daily.   1   famotidine (PEPCID) 20 MG tablet Take 20 mg by mouth 3 (three) times daily.     gabapentin (NEURONTIN) 300 MG capsule Take 300 mg by mouth as needed.     Glucosamine-MSM-Hyaluronic Acd 750-375-30 MG TABS Take 2 tablets by mouth daily.     hydrALAZINE (APRESOLINE) 25 MG tablet Take 50 mg by mouth 3 (three) times daily.     HYDROcodone-acetaminophen (NORCO/VICODIN) 5-325 MG tablet Take 1 tablet by mouth every 6 (six) hours as needed.     insulin lispro (HUMALOG) 100 UNIT/ML injection Inject 2-4 Units into the skin 3 (three) times daily before meals. Per sliding scale     Lactobacillus (ACIDOPHILUS) CAPS capsule Take 1 capsule by mouth daily.     latanoprost (XALATAN) 0.005 % ophthalmic solution Place 1  drop into both eyes at bedtime.   1   LEVEMIR 100 UNIT/ML injection Inject 7 Units into the skin 2 (two) times daily.  1   levothyroxine (SYNTHROID) 125 MCG tablet Take 250 mcg by mouth daily before breakfast.     losartan (COZAAR) 25 MG tablet Take 25 mg by mouth daily.     lovastatin (MEVACOR) 40 MG tablet Take 40 mg by mouth at bedtime.      Multiple Vitamins-Minerals (PRESERVISION AREDS 2) CAPS Take 1 capsule by mouth 2 (two) times daily.     Omega-3 Fatty Acids (FISH OIL) 1200 MG CAPS Take 1 capsule by mouth daily.     omeprazole (PRILOSEC) 20  MG capsule Take 1 capsule (20 mg total) by mouth daily. 30 capsule 3   Pancrelipase, Lip-Prot-Amyl, 6000-19000 units CPEP Take 4 capsules by mouth 3 (three) times daily.     sildenafil (VIAGRA) 100 MG tablet Take 100 mg by mouth daily as needed.     torsemide (DEMADEX) 20 MG tablet Take 20 mg by mouth 2 (two) times daily.     traMADol (ULTRAM) 50 MG tablet Take 50 mg by mouth 2 (two) times daily as needed.     No current facility-administered medications for this visit.    REVIEW OF SYSTEMS:   10 Point review of Systems was done is negative except as noted above.  PHYSICAL EXAMINATION: ECOG FS:1 - Symptomatic but completely ambulatory  There were no vitals filed for this visit.  Wt Readings from Last 3 Encounters:  12/05/21 175 lb 12.8 oz (79.7 kg)  09/13/21 181 lb 9.6 oz (82.4 kg)  09/12/21 177 lb 11.2 oz (80.6 kg)   There is no height or weight on file to calculate BMI.      NAD GENERAL:alert, in no acute distress and comfortable SKIN: no acute rashes, no significant lesions EYES: conjunctiva are pink and non-injected, sclera anicteric OROPHARYNX: MMM, no exudates, no oropharyngeal erythema or ulceration NECK: supple, no JVD LYMPH:  no palpable lymphadenopathy in the cervical, axillary or inguinal regions LUNGS: clear to auscultation b/l with normal respiratory effort HEART: regular rate & rhythm ABDOMEN:  normoactive bowel sounds  , non tender, not distended. Extremity: no pedal edema PSYCH: alert & oriented x 3 with fluent speech NEURO: no focal motor/sensory deficits   LABORATORY DATA:   I have reviewed the data as listed  .    Latest Ref Rng & Units 01/02/2022    8:03 AM 11/18/2021   11:28 AM 09/16/2021   11:51 AM  CBC  WBC 4.0 - 10.5 K/uL 8.9   6.7   8.4    Hemoglobin 13.0 - 17.0 g/dL 12.3   12.8   13.4    Hematocrit 39.0 - 52.0 % 37.3   40.3   41.7    Platelets 150 - 400 K/uL 220   193   234     . CBC    Component Value Date/Time   WBC 8.9 01/02/2022 0803   WBC 6.7 11/18/2021 1128   RBC 3.54 (L) 01/02/2022 0803   HGB 12.3 (L) 01/02/2022 0803   HGB 14.0 06/11/2017 0808   HCT 37.3 (L) 01/02/2022 0803   HCT 40.7 06/11/2017 0808   PLT 220 01/02/2022 0803   PLT 223 06/11/2017 0808   MCV 105.4 (H) 01/02/2022 0803   MCV 112.1 (H) 06/11/2017 0808   MCH 34.7 (H) 01/02/2022 0803   MCHC 33.0 01/02/2022 0803   RDW 13.8 01/02/2022 0803   RDW 13.9 06/11/2017 0808   LYMPHSABS 1.9 01/02/2022 0803   LYMPHSABS 1.6 06/11/2017 0808   MONOABS 1.4 (H) 01/02/2022 0803   MONOABS 0.5 06/11/2017 0808   EOSABS 0.2 01/02/2022 0803   EOSABS 0.1 06/11/2017 0808   BASOSABS 0.1 01/02/2022 0803   BASOSABS 0.0 06/11/2017 0808   .    Latest Ref Rng & Units 01/02/2022    8:03 AM 11/18/2021   11:28 AM 09/16/2021   11:51 AM  CMP  Glucose 70 - 99 mg/dL 95   99   207    BUN 8 - 23 mg/dL 40   25   30    Creatinine 0.61 - 1.24 mg/dL 2.54  3.13   2.50    Sodium 135 - 145 mmol/L 142   140   140    Potassium 3.5 - 5.1 mmol/L 3.9   3.8   4.2    Chloride 98 - 111 mmol/L 111   109   104    CO2 22 - 32 mmol/L '24   28   26    '$ Calcium 8.9 - 10.3 mg/dL 8.7   7.8   8.7    Total Protein 6.5 - 8.1 g/dL 5.9   5.6   6.1    Total Bilirubin 0.3 - 1.2 mg/dL 0.7   0.7   0.9    Alkaline Phos 38 - 126 U/L 157   169   143    AST 15 - 41 U/L 29   43   47    ALT 0 - 44 U/L 30   44   41     08/18/2019 CT Abdomen Pelvis Wo Contrast  (Accession [id number])   08/18/2019 CT Chest Wo Contrast (Accession [id number])   RADIOGRAPHIC STUDIES: I have personally reviewed the radiological images as listed and agreed with the findings in the report. No results found.    ASSESSMENT & PLAN:   71 y.o. Caucasian male with  #1 Recurrent metastatic Renal cell carcinoma with multiple lung metastases.  Has been on stable on 1st line Sutent for >44yr Has had SBRT to the dominant right lung nodule with partial response. No lab or clinical evidence of metastatic RCC progression at this time.  PET/CT 08/08/2016 with a couple of mildly enlarge LLL pulmonary nodules - no other overt evidence of RCC progression at this time.  PET CT scan 11/06/2016 - no overt evidence of RCC progression at this time .  PET/CT 03/13/2017 - no overt evidence of RCC progression at this time .  PET/CT 08/10/2017 - no overt evidence of RCC progression at this time .  PET/ CT done 08/10/2017, shows no evidence of  Progression at this time.  12/07/17 PET which revealed No findings suspicious for recurrent or metastatic disease.  03/22/18 PET/CT revealed One of the left lower lobe nodules has very minimally enlarged over the last 2 years, currently 0.6 by 0.4 cm and measuring 0.4 by 0.3 cm on 03/20/2016. This probably represents a previously treated metastatic nodule given that it measured 1.0 by 0.8 cm on 06/06/2015. This nodule may be subject to some slice selection phenomenon and also motion artifact given proximity to the diaphragm. Surveillance is likely warranted. 2. Chronic central lucency, marginal bony deposition, and slow healing of the right lateral fourth rib fracture. Possibilities may include underlying radiation necrosis as a cause for fracture leading to slow healing, or a low-grade pathologic lesion. Maximum SUV at this fracture site is 2.9, previously 2.4. 3. 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 There is only low-grade activity in this vicinity characteristic of radiation fibrosis, without focal activity. 4. Other imaging findings of potential clinical significance: Aortic Atherosclerosis. Coronary atherosclerosis. Mild cardiomegaly. Right nephrectomy. 8 mm nonobstructive calculus in the left kidney lower pole.   08/24/18 CT C/A/P revealed "Stable small left lower lobe pulmonary nodules from recent prior studies. As previously noted, these have decreased in size from PET-CT 06/06/2015, likely treated metastases. No new or enlarging pulmonary nodules. 2. Postsurgical changes in the upper abdomen without evidence of local recurrence or metastatic disease. 3. Nonobstructing left renal calculus. 4. Stable  radiation changes in the right upper lobe with probable chronic pathologic fracture of the right 4th rib laterally."  03/07/2019 CT C/A wo contrast revealed "1. Stable left lower lobe pulmonary nodules, likely treated Metastases. 2. No additional evidence of metastatic disease. 3. Left renal stone. 4. Post radiation scarring in the right hemithorax with a healed adjacent right rib fracture which is presumably pathologic in Etiology. 5.  Aortic atherosclerosis (ICD10-170.0). 6. 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 Estanislado Pandy for mx #6 DM2 -continue f/u with Dr Philip Aspen for continue mx, blood sugar at home per patient.  #7postsplenectomy status #8 abnormal liver function tests.  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  Plan -Labs done today were discussed in detail with the patient CBC stable CMP shows improvement in creatinine  which is down to 2.54 LDH stable at 237 Ferritin at 823 Patient shall be proceeding with his first therapeutic phlebotomy limited at 300 mL to monitor for tolerance. -He will continue to avoid any iron replacement. -Maintain good hydration -Continue to optimize diabetes management with PCP -No indication for active treatment of the patient's RCC at this time  Follow-up Return to clinic with Dr. Irene Limbo with labs and appointment for therapeutic phlebotomy in 3 months  The total time spent in the appointment was 20 minutes*.  All of the patient's questions were answered with apparent satisfaction. The patient knows to call the clinic with any problems, questions or concerns.   Sullivan Lone MD MS AAHIVMS [facility] Hematology/Oncology Physician [facility]  .*Total Encounter Time as defined by the Centers for Medicare and Medicaid Services includes, in addition to the face-to-face time of a patient visit (documented in the note above) non-face-to-face time: obtaining and reviewing outside history, ordering and reviewing medications, tests or procedures, care coordination (communications with other health care professionals or caregivers) and documentation in the medical record.  I, Melene Muller, am acting as scribe for Dr. Sullivan Lone, MD. .I have reviewed the above documentation for accuracy and completeness, and I agree with the above. Brunetta Genera MD

## 2022-01-02 ENCOUNTER — Other Ambulatory Visit: Payer: Self-pay

## 2022-01-02 ENCOUNTER — Inpatient Hospital Stay: Payer: Medicare Other | Attending: Hematology

## 2022-01-02 ENCOUNTER — Inpatient Hospital Stay: Payer: Medicare Other | Admitting: Hematology

## 2022-01-02 ENCOUNTER — Inpatient Hospital Stay: Payer: Medicare Other

## 2022-01-02 VITALS — BP 180/79 | HR 60 | Temp 98.1°F | Resp 20 | Wt 181.3 lb

## 2022-01-02 DIAGNOSIS — C649 Malignant neoplasm of unspecified kidney, except renal pelvis: Secondary | ICD-10-CM

## 2022-01-02 DIAGNOSIS — R7989 Other specified abnormal findings of blood chemistry: Secondary | ICD-10-CM | POA: Insufficient documentation

## 2022-01-02 DIAGNOSIS — M069 Rheumatoid arthritis, unspecified: Secondary | ICD-10-CM | POA: Insufficient documentation

## 2022-01-02 DIAGNOSIS — C7900 Secondary malignant neoplasm of unspecified kidney and renal pelvis: Secondary | ICD-10-CM

## 2022-01-02 DIAGNOSIS — Z87891 Personal history of nicotine dependence: Secondary | ICD-10-CM | POA: Insufficient documentation

## 2022-01-02 DIAGNOSIS — R918 Other nonspecific abnormal finding of lung field: Secondary | ICD-10-CM | POA: Diagnosis present

## 2022-01-02 DIAGNOSIS — Z85038 Personal history of other malignant neoplasm of large intestine: Secondary | ICD-10-CM | POA: Insufficient documentation

## 2022-01-02 DIAGNOSIS — N189 Chronic kidney disease, unspecified: Secondary | ICD-10-CM | POA: Diagnosis not present

## 2022-01-02 DIAGNOSIS — Z85528 Personal history of other malignant neoplasm of kidney: Secondary | ICD-10-CM | POA: Insufficient documentation

## 2022-01-02 DIAGNOSIS — I10 Essential (primary) hypertension: Secondary | ICD-10-CM | POA: Diagnosis not present

## 2022-01-02 LAB — CMP (CANCER CENTER ONLY)
ALT: 30 U/L (ref 0–44)
AST: 29 U/L (ref 15–41)
Albumin: 3.4 g/dL — ABNORMAL LOW (ref 3.5–5.0)
Alkaline Phosphatase: 157 U/L — ABNORMAL HIGH (ref 38–126)
Anion gap: 7 (ref 5–15)
BUN: 40 mg/dL — ABNORMAL HIGH (ref 8–23)
CO2: 24 mmol/L (ref 22–32)
Calcium: 8.7 mg/dL — ABNORMAL LOW (ref 8.9–10.3)
Chloride: 111 mmol/L (ref 98–111)
Creatinine: 2.54 mg/dL — ABNORMAL HIGH (ref 0.61–1.24)
GFR, Estimated: 26 mL/min — ABNORMAL LOW (ref >60)
Glucose, Bld: 95 mg/dL (ref 70–99)
Potassium: 3.9 mmol/L (ref 3.5–5.1)
Sodium: 142 mmol/L (ref 135–145)
Total Bilirubin: 0.7 mg/dL (ref 0.3–1.2)
Total Protein: 5.9 g/dL — ABNORMAL LOW (ref 6.5–8.1)

## 2022-01-02 LAB — CBC WITH DIFFERENTIAL (CANCER CENTER ONLY)
Abs Immature Granulocytes: 0.03 K/uL (ref 0.00–0.07)
Basophils Absolute: 0.1 K/uL (ref 0.0–0.1)
Basophils Relative: 1 %
Eosinophils Absolute: 0.2 K/uL (ref 0.0–0.5)
Eosinophils Relative: 3 %
HCT: 37.3 % — ABNORMAL LOW (ref 39.0–52.0)
Hemoglobin: 12.3 g/dL — ABNORMAL LOW (ref 13.0–17.0)
Immature Granulocytes: 0 %
Lymphocytes Relative: 21 %
Lymphs Abs: 1.9 K/uL (ref 0.7–4.0)
MCH: 34.7 pg — ABNORMAL HIGH (ref 26.0–34.0)
MCHC: 33 g/dL (ref 30.0–36.0)
MCV: 105.4 fL — ABNORMAL HIGH (ref 80.0–100.0)
Monocytes Absolute: 1.4 K/uL — ABNORMAL HIGH (ref 0.1–1.0)
Monocytes Relative: 16 %
Neutro Abs: 5.3 K/uL (ref 1.7–7.7)
Neutrophils Relative %: 59 %
Platelet Count: 220 K/uL (ref 150–400)
RBC: 3.54 MIL/uL — ABNORMAL LOW (ref 4.22–5.81)
RDW: 13.8 % (ref 11.5–15.5)
WBC Count: 8.9 K/uL (ref 4.0–10.5)
nRBC: 0 % (ref 0.0–0.2)

## 2022-01-02 LAB — FERRITIN: Ferritin: 823 ng/mL — ABNORMAL HIGH (ref 24–336)

## 2022-01-02 LAB — LACTATE DEHYDROGENASE: LDH: 170 U/L (ref 98–192)

## 2022-01-02 NOTE — Progress Notes (Signed)
Delphina Cahill presents today for phlebotomy per MD orders. Per Dr Irene Limbo, only remove 300g today to see how well pt tolerates. Phlebotomy procedure started at 0950 and ended at 1000. Pt clotted after 90g removed. Phlebotomy procedure restarted at 1005 and ended at 1015. 212 grams removed for a total of 302g.  Patient observed for 30 minutes after procedure without any incident. Patient tolerated procedure well. Food and beverage offered. IV needle removed intact.

## 2022-01-02 NOTE — Patient Instructions (Signed)

## 2022-01-08 ENCOUNTER — Encounter: Payer: Self-pay | Admitting: Hematology

## 2022-01-23 ENCOUNTER — Other Ambulatory Visit: Payer: Self-pay

## 2022-01-23 MED ORDER — OMEPRAZOLE 20 MG PO CPDR: 20.0000 mg | DELAYED_RELEASE_CAPSULE | Freq: Every day | ORAL | 3 refills | Status: AC

## 2022-03-18 ENCOUNTER — Other Ambulatory Visit: Payer: Self-pay | Admitting: Urology

## 2022-03-19 NOTE — Patient Instructions (Addendum)
DUE TO SPACE LIMITATIONS, ONLY TWO VISITORS  (aged 72 and older) ARE ALLOWED TO COME WITH YOU AND STAY IN THE WAITING ROOM DURING YOUR PRE OP AND PROCEDURE.   **NO VISITORS ARE ALLOWED IN THE SHORT STAY AREA OR RECOVERY ROOM!!**   You are not required to quarantine at this time prior to your surgery. However, you must do this: Hand Hygiene often Do NOT share personal items Notify your provider if you are in close contact with someone who has COVID or you develop fever 100.4 or greater, new onset of sneezing, cough, sore throat, shortness of breath or body aches.       Your procedure is scheduled on:  Monday  March 31, 2022  Report to [facility] Main Entrance.  Report to admitting at: 06:45  AM  +++++Call this number if you have any questions or problems the morning of surgery [phone]  Do not eat food :After Midnight the night prior to your surgery/procedure.  After Midnight you may have the following liquids until  06:00 AM DAY OF SURGERY  Clear Liquid Diet Water Black Coffee (sugar ok, NO MILK/CREAM OR CREAMERS)  Tea (sugar ok, NO MILK/CREAM OR CREAMERS) regular and decaf                             Plain Jell-O (NO RED)                                           Fruit ices (not with fruit pulp, NO RED)                                     Popsicles (NO RED)                                                                  Juice: apple, WHITE grape, WHITE cranberry Sports drinks like Gatorade (NO RED)             FOLLOW BOWEL PREP AND ANY ADDITIONAL PRE OP INSTRUCTIONS YOU RECEIVED FROM YOUR SURGEON'S OFFICE!!!   Oral Hygiene is also important to reduce your risk of infection.        Remember - BRUSH YOUR TEETH THE MORNING OF SURGERY WITH YOUR REGULAR TOOTHPASTE   Take ONLY these medicines the morning of surgery with A SIP OF WATER: Synthroid, Atenolol, Pancrelipase, Pepcid, Cosopt eye drops and if needed you may take gabapentin, bentyl  How to Manage Your  Diabetes Before and After Surgery  Why is it important to control my blood sugar before and after surgery? Improving blood sugar levels before and after surgery helps healing and can limit problems. A way of improving blood sugar control is eating a healthy diet by:  Eating less sugar and carbohydrates  Increasing activity/exercise  Talking with your doctor about reaching your blood sugar goals High blood sugars (greater than 180 mg/dL) can raise your risk of infections and slow your recovery, so you will need to focus on controlling your diabetes during the weeks before surgery.  Make sure that the doctor who takes care of your diabetes knows about your planned surgery including the date and location.  How do I manage my blood sugar before surgery? Check your blood sugar at least 4 times a day, starting 2 days before surgery, to make sure that the level is not too high or low. Check your blood sugar the morning of your surgery when you wake up and every 2 hours until you get to the Short Stay unit. If your blood sugar is less than 70 mg/dL, you will need to treat for low blood sugar: Do not take insulin. Treat a low blood sugar (less than 70 mg/dL) with  cup of clear juice (cranberry or apple), 4 glucose tablets, OR glucose gel. Recheck blood sugar in 15 minutes after treatment (to make sure it is greater than 70 mg/dL). If your blood sugar is not greater than 70 mg/dL on recheck, call [phone] for further instructions. Report your blood sugar to the short stay nurse when you get to Short Stay.  If you are admitted to the hospital after surgery: Your blood sugar will be checked by the staff and you will probably be given insulin after surgery (instead of oral diabetes medicines) to make sure you have good blood sugar levels. The goal for blood sugar control after surgery is 80-180 mg/dL.   WHAT DO I DO ABOUT MY DIABETES MEDICATION?                LEVEMIR- Evening prior take 80% or 8  units       Day of surgery:  6 units                HUMALOG - No bedtime dose ,  morning of surgery:  if your CBG is greater than 220 mg/dL, you may take  of your sliding scale  (correction) dose of insulin.    IF you have any questions, call the nurse at [phone]                  You may not have any metal on your body including  jewelry, and body piercing  Do not wear  lotions, powders, cologne, or deodorant   Men may shave face and neck.  Contacts, Hearing Aids, dentures or bridgework may not be worn into surgery.   DO NOT [place]. PHARMACY WILL DISPENSE MEDICATIONS LISTED ON YOUR MEDICATION LIST TO YOU DURING YOUR ADMISSION [place]!   Patients discharged on the day of surgery will not be allowed to drive home.  Someone NEEDS to stay with you for the first 24 hours after anesthesia.  Please read over the following fact sheets you were given: IF YOU HAVE QUESTIONS ABOUT YOUR PRE-OP INSTRUCTIONS, PLEASE CALL [phone]  ([place])   [place] - Preparing for Surgery Before surgery, you can play an important role.  Because skin is not sterile, your skin needs to be as free of germs as possible.  You can reduce the number of germs on your skin by washing with CHG (chlorahexidine gluconate) soap before surgery.  CHG is an antiseptic cleaner which kills germs and bonds with the skin to continue killing germs even after washing. Please DO NOT use if you have an allergy to CHG or antibacterial soaps.  If your skin becomes reddened/irritated stop using the CHG and inform your nurse when you arrive at Short Stay. Do not shave (including legs and underarms) for at least  48 hours prior to the first CHG shower.  You may shave your face/neck.  Please follow these instructions carefully:  1.  Shower with CHG Soap the night before surgery and the  morning of surgery.  2.  If you choose to wash your hair, wash your hair first as usual with your  normal  shampoo.  3.  After you shampoo, rinse your hair and body thoroughly to remove the shampoo.                             4.  Use CHG as you would any other liquid soap.  You can apply chg directly to the skin and wash.  Gently with a scrungie or clean washcloth.  5.  Apply the CHG Soap to your body ONLY FROM THE NECK DOWN.   Do not use on face/ open                           Wound or open sores. Avoid contact with eyes, ears mouth and genitals (private parts).                       Wash face,  Genitals (private parts) with your normal soap.             6.  Wash thoroughly, paying special attention to the area where your  surgery  will be performed.  7.  Thoroughly rinse your body with warm water from the neck down.  8.  DO NOT shower/wash with your normal soap after using and rinsing off the CHG Soap.            9.  Pat yourself dry with a clean towel.            10.  Wear clean pajamas.            11.  Place clean sheets on your bed the night of your first shower and do not  sleep with pets.  ON THE DAY OF SURGERY : Do not apply any lotions/deodorants the morning of surgery.  Please wear clean clothes to the hospital/surgery center.    FAILURE TO FOLLOW THESE INSTRUCTIONS MAY RESULT IN THE CANCELLATION OF YOUR SURGERY  PATIENT SIGNATURE_________________________________  NURSE SIGNATURE__________________________________  ________________________________________________________________________

## 2022-03-19 NOTE — Progress Notes (Addendum)
COVID Vaccine received:  '[]'$  No '[x]'$  Yes Date of any COVID positive Test in last 90 days: none  PCP - Georgiann Mccoy Cardiologist - none Rheumatology - Bo Merino, MD Hematology/ Oncology- Fabienne Bruns, MD Nephrology- Dr. Gean Quint at [facility]   Chest x-ray - CT chest/ Abdomen/ Pelvis 11-25-2021  Epic EKG -  03-21-2020  Will repeat at PST 03-21-2022 Stress Test -   n/a ECHO - 03-20-2020 Cardiac Cath - n/a  Pacemaker/ICD device     '[x]'$  N/A Spinal Cord Stimulator:'[x]'$  No '[]'$  Yes   Other Implants: none  Bowel Prep - no bowel prep per patient  History of Sleep Apnea? '[x]'$  No '[]'$  Yes   Sleep Study Date:   CPAP used?- '[x]'$  No '[]'$  Yes  (Instruct to bring their mask & Tubing)  Does the patient monitor blood sugar? '[]'$  No '[x]'$  Yes  '[]'$  N/A Does patient have a [employer] or Dexacom? '[]'$  No '[x]'$  Yes    Freestyle Libre - information given  Fasting Blood Sugar Ranges- 100- 180 CBG at this PST appt - 100  Blood Thinner Instructions:   none Aspirin Instructions:  81 mg Last Dose:03-20-22  ERAS Protocol Ordered: '[x]'$  No  '[]'$  Yes PRE-SURGERY '[]'$  ENSURE  '[]'$  G2   Comments: Patient has Macular degeneration. hx of Whipple procedure (splenectomy,pancreatectomy, Cholecystectomy, gastrectomy from tumor)  Activity level: Patient can climb a flight of stairs without difficulty;  '[x]'$  No CP  '[x]'$  No SOB   Anesthesia review: CKD4 - Right nephrectomy (renal cancer), Hemochromatosis, Hx MRSA  Patient denies shortness of breath, fever, cough and chest pain at PAT appointment.  Patient verbalized understanding and agreement to the Pre-Surgical Instructions that were given to them at this PAT appointment. Patient was also educated of the need to review these PAT instructions again prior to his/her surgery.I reviewed the appropriate phone numbers to call if they have any and questions or concerns.

## 2022-03-21 ENCOUNTER — Other Ambulatory Visit: Payer: Self-pay

## 2022-03-21 ENCOUNTER — Encounter (HOSPITAL_COMMUNITY)
Admission: RE | Admit: 2022-03-21 | Discharge: 2022-03-21 | Disposition: A | Discharge status: Home / Self Care | Payer: Medicare Other | Source: Ambulatory Visit | Attending: Urology | Admitting: Urology

## 2022-03-21 ENCOUNTER — Encounter (HOSPITAL_COMMUNITY): Payer: Self-pay

## 2022-03-21 VITALS — BP 150/72 | HR 48 | Temp 97.3°F | Resp 16 | Ht 70.0 in | Wt 177.0 lb

## 2022-03-21 DIAGNOSIS — K769 Liver disease, unspecified: Secondary | ICD-10-CM | POA: Diagnosis not present

## 2022-03-21 DIAGNOSIS — Z8614 Personal history of Methicillin resistant Staphylococcus aureus infection: Secondary | ICD-10-CM | POA: Diagnosis not present

## 2022-03-21 DIAGNOSIS — Z794 Long term (current) use of insulin: Secondary | ICD-10-CM | POA: Diagnosis not present

## 2022-03-21 DIAGNOSIS — Z01818 Encounter for other preprocedural examination: Secondary | ICD-10-CM | POA: Insufficient documentation

## 2022-03-21 DIAGNOSIS — E119 Type 2 diabetes mellitus without complications: Secondary | ICD-10-CM | POA: Insufficient documentation

## 2022-03-21 DIAGNOSIS — I10 Essential (primary) hypertension: Secondary | ICD-10-CM | POA: Insufficient documentation

## 2022-03-21 HISTORY — DX: Induration penis plastica: N48.6

## 2022-03-21 HISTORY — DX: Bradycardia, unspecified: R00.1

## 2022-03-21 LAB — COMPREHENSIVE METABOLIC PANEL
ALT: 39 U/L (ref 0–44)
AST: 41 U/L (ref 15–41)
Albumin: 3.4 g/dL — ABNORMAL LOW (ref 3.5–5.0)
Alkaline Phosphatase: 152 U/L — ABNORMAL HIGH (ref 38–126)
Anion gap: 5 (ref 5–15)
BUN: 39 mg/dL — ABNORMAL HIGH (ref 8–23)
CO2: 25 mmol/L (ref 22–32)
Calcium: 8.3 mg/dL — ABNORMAL LOW (ref 8.9–10.3)
Chloride: 109 mmol/L (ref 98–111)
Creatinine, Ser: 2.49 mg/dL — ABNORMAL HIGH (ref 0.61–1.24)
GFR, Estimated: 27 mL/min — ABNORMAL LOW (ref >60)
Glucose, Bld: 69 mg/dL — ABNORMAL LOW (ref 70–99)
Potassium: 3.7 mmol/L (ref 3.5–5.1)
Sodium: 139 mmol/L (ref 135–145)
Total Bilirubin: 0.9 mg/dL (ref 0.3–1.2)
Total Protein: 6.3 g/dL — ABNORMAL LOW (ref 6.5–8.1)

## 2022-03-21 LAB — CBC
HCT: 43.5 % (ref 39.0–52.0)
Hemoglobin: 13.7 g/dL (ref 13.0–17.0)
MCH: 33.4 pg (ref 26.0–34.0)
MCHC: 31.5 g/dL (ref 30.0–36.0)
MCV: 106.1 fL — ABNORMAL HIGH (ref 80.0–100.0)
Platelets: 248 10*3/uL (ref 150–400)
RBC: 4.1 MIL/uL — ABNORMAL LOW (ref 4.22–5.81)
RDW: 12.9 % (ref 11.5–15.5)
WBC: 7.5 10*3/uL (ref 4.0–10.5)
nRBC: 0 % (ref 0.0–0.2)

## 2022-03-21 LAB — HEMOGLOBIN A1C
Hgb A1c MFr Bld: 6.3 % — ABNORMAL HIGH (ref 4.8–5.6)
Mean Plasma Glucose: 134.11 mg/dL

## 2022-03-21 LAB — SURGICAL PCR SCREEN
MRSA, PCR: NEGATIVE
Staphylococcus aureus: NEGATIVE

## 2022-03-28 NOTE — H&P (Signed)
Office Visit Report     03/12/2022   --------------------------------------------------------------------------------   Dylan Jefferson  MRN: [id number]  DOB: [birth year], 71 year old Male  SSN: [id number]   PRIMARY CARE:  Ermalene Searing. Philip Aspen, MD  REFERRING:  Gean Quint, MD  PROVIDER:  Daine Gravel, NP  LOCATION:  [facility] [phone]     --------------------------------------------------------------------------------   CC/HPI: cc: Nephrolithiasis in the setting of a solitary kidney   HPI: Dylan Jefferson is a 71 year old man who is currently under the care of Dr. Candiss Norse at [facility] as well as Dr. Irene Limbo at oncology due to metastatic renal cell carcinoma. The patient had a right total nephrectomy approximately 20 years ago. Is been getting close follow-up from [facility] and hematology oncology. At his recent nephrologist appointment, he was found to have a enlarging left renal stone previously measuring around 5 to 6 mm and now measuring around 7-8. His renal function is also slightly decreased and his GFR is now 24. His nephrologist was concerned that the stone could become an issue and may be contributing to some of his worsening renal function. It was advised that he follow-up with [place] for consideration of definitive stone intervention. He is a patient of Dr. Alinda Money and has had multiple ureteroscopy's in the past as well as lithotripsies.     ALLERGIES: Contrast Dye    MEDICATIONS: Acidophilus CAPS Oral  AMLODIPINE BESYLATE PO Daily  Aspirin Ec 81 mg tablet, delayed release  Atenolol 50 mg tablet  Azelastine Hcl 0.05 % drops  Calcium Citrate- Vitamin D 315 mg calcium-6.25 mcg (250 unit) tablet Oral  Dorzolamide-Timolol 22.3 mg-6.8 mg/ml drops  Famotidine  FISH OIL OMEGA-3 PO Daily  Hydralazine Hcl  Joint Health  Latanoprost 0.005 % drops  Levemir  LEVOTHYROXINE SODIUM PO Daily  Losartan Potassium  Lovastatin  Pancrelipase  PRESERVISION AREDS  2 PO Daily  Tenormin 50 mg tablet tablet PO As Directed  TORSEMIDE PO Daily  Tramadol Hcl  VITAMIN B-12 SL Daily  Vitamin D3  VITAMIN D3 PO Daily     GU PSH: Cysto Uretero Lithotripsy - 2012, 2010 Cystoscopy Insert Stent - 2012, 2010, 2010 ESWL - 2010 Radical nephrectomy (open) - about 1993       [place] Notes: Radical Nephrectomy, Cystoscopy With Ureteroscopy With Lithotripsy, Cystoscopy With Insertion Of Ureteral Stent Left, Cystoscopy With Insertion Of Ureteral Stent Left, Cystoscopy With Pyeloscopy With Lithotripsy, Lithotripsy, Thyroid Surgery Total Thyroidectomy, Total Pancreatectomy, Partial Gastrectomy, Tonsillectomy, Cystoscopy With Insertion Of Ureteral Stent Left   NON-GU PSH: Cholecystectomy (open) - about 2001 Gastrectomy - about 2005 Remove Pancreas - about 2001 Remove Spleen; Total - about 2001 Remove Tonsils - about 1956     GU PMH: Left testicular pain - 06/25/2020, - 06/20/2020 ED due to arterial insufficiency - 2021 Dysuria, Dysuria - 2014 Flank Pain, Generalized abdominal pain - 2014 Renal calculus, Nephrolithiasis - 2014 Ureteral calculus, Calculus of ureter - 2014 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 Kidney Cancer Unspec, except renal pelvis, Renal cell carcinoma, unspecified laterality - 1993      PMH Notes:  1898-08-04 00:00:00 - Note: Normal Routine History And Physical Adult  2008-09-13 16:12:48 - Note: Arthritis  Renal cell cancer  Macsular degeneration  hard of hearing   NON-GU PMH: Cataract - 2013 Glaucoma - 2012 Hyperthyroidism, Hyperthyroidism - 1993 Osteoarthritis - 1993 Personal history of other diseases of the circulatory system, History of hypertension - 1993 Personal history of other endocrine, nutritional  and metabolic disease, History of diabetes mellitus - 1993 Rheumatoid Arthritis - 1993 Arthritis    FAMILY HISTORY: Arthritis - Mother, Sister Congestive Heart Failure - Mother, Aunt, Uncle Diabetes - Father,  Mother High Blood Pressure - Mother, Sister, Daughter Hodgkin's lymphoma - Cousin, Father Kidney Cancer - Aunt, No Family History Kidney Stones - Aunt, Daughter Lung Cancer - Father nephrolithiasis - Mother, Uncle, Aunt   SOCIAL HISTORY: Marital Status: Married Preferred Language: English; Ethnicity: Not Hispanic Or Latino; Race: White Current Smoking Status: Patient does not smoke anymore. Has not smoked since 06/18/1973. Smoked less than 1/2 pack per day.  Does not use smokeless tobacco. Has not drank since 03/18/2020. Had 1 drinks per day. Does not use drugs. Drinks 2 caffeinated drinks per day. Has had a blood transfusion. Patient's occupation is/was Retired.     Notes: Previous History Of Smoking, Marital History - Currently Married   REVIEW OF SYSTEMS:    GU Review Male:   Patient denies burning/ pain with urination, get up at night to urinate, leakage of urine, stream starts and stops, trouble starting your stream, have to strain to urinate , erection problems, and penile pain.  Gastrointestinal (Upper):   Patient denies nausea, vomiting, and indigestion/ heartburn.  Gastrointestinal (Lower):   Patient denies diarrhea and constipation.  Constitutional:   Patient reports fatigue. Patient denies fever, night sweats, and weight loss.  Skin:   Patient denies skin rash/ lesion and itching.  Musculoskeletal:   Patient denies back pain and joint pain.  Neurological:   Patient denies headaches and dizziness.  Psychologic:   Patient denies depression and anxiety.   VITAL SIGNS:      03/12/2022 12:46 PM  Weight 172 lb / 78.02 kg  Height 69.5 in / 176.53 cm  BP 151/84 mmHg  Pulse 43 /min  Temperature 97.7 F / 36.5 C  BMI 25.0 kg/m  Notes: Patient HR always runs between 38 and 55   MULTI-SYSTEM PHYSICAL EXAMINATION:    Constitutional: Thin. No physical deformities. Normally developed. Good grooming.   Respiratory: No labored breathing, no use of accessory muscles.    Cardiovascular: Normal temperature, normal extremity pulses, no swelling, no varicosities.  Skin: Skin pale. No jaundice, no cyanosis. No lesion, no ulcer, no rash.   Neurologic / Psychiatric: Oriented to time, oriented to place, oriented to person. No depression, no anxiety, no agitation.     Complexity of Data:  Source Of History:  Patient  Records Review:   Previous Doctor Records, Previous Patient Records  Urine Test Review:   Urinalysis  X-Ray Review: C.T. Abdomen/Pelvis: Reviewed Films. Reviewed Report.     03/12/22  Urinalysis  Urine Appearance Clear   Urine Color Yellow   Urine Glucose Trace mg/dL  Urine Bilirubin Neg mg/dL  Urine Ketones Neg mg/dL  Urine Specific Gravity 1.025   Urine Blood Neg ery/uL  Urine pH 5.5   Urine Protein 2+ mg/dL  Urine Urobilinogen 0.2 mg/dL  Urine Nitrites Neg   Urine Leukocyte Esterase Neg leu/uL  Urine WBC/hpf 0 - 5/hpf   Urine RBC/hpf 0 - 2/hpf   Urine Epithelial Cells 0 - 5/hpf   Urine Bacteria NS (Not Seen)   Urine Mucous Present   Urine Yeast NS (Not Seen)   Urine Trichomonas Not Present   Urine Cystals NS (Not Seen)   Urine Casts Hyaline   Urine Sperm Not Present    PROCEDURES:          Urinalysis w/Scope Dipstick Dipstick Cont'd Micro  Color: Yellow Bilirubin: Neg mg/dL WBC/hpf: 0 - 5/hpf  Appearance: Clear Ketones: Neg mg/dL RBC/hpf: 0 - 2/hpf  Specific Gravity: 1.025 Blood: Neg ery/uL Bacteria: NS (Not Seen)  pH: 5.5 Protein: 2+ mg/dL Cystals: NS (Not Seen)  Glucose: Trace mg/dL Urobilinogen: 0.2 mg/dL Casts: Hyaline    Nitrites: Neg Trichomonas: Not Present    Leukocyte Esterase: Neg leu/uL Mucous: Present      Epithelial Cells: 0 - 5/hpf      Yeast: NS (Not Seen)      Sperm: Not Present    ASSESSMENT:      ICD-10 Details  1 GU:   Kidney Cancer Unspec, except renal pelvis - C64.9 Chronic, Stable  2   Renal calculus - N20.0 Chronic, Worsening   PLAN:           Schedule Return Visit/Planned Activity: Next  Available Appointment - Schedule Surgery          Document Letter(s):  Created for Patient: Clinical Summary         Notes:   Based on his chronic kidney disease and solitary kidney, ESWL would not be the safest or most beneficial procedure for him to pursue. However, I did discuss definitive stone intervention including ESWL as well as ureteroscopy with him.   Stone intervention was discussed in detail today. For ureteroscopy, the patient understands that there is a chance for a staged procedure. Patient also understands that there is risk for bleeding, infection, injury to surrounding organs, and general risks of anesthesia. The patient also understands the placement of a stent and the risks of stent placement including, risk for infection, the risk for pain, and the risk for injury. For ESWL, the patient understands that there is a chance of failure of procedure, there is also a risk for bruising, infection, bleeding, and injury to surrounding structures. He understands that for patient with a solitary kidney this option is not recommended. The patient verbalized understanding to these risks.   He would like to pursue ureteroscopy. I will place the appropriate paperwork and follow-up with his urologist to ensure there are no other recommendations.    * Signed by Daine Gravel, NP on 03/12/22 at 1:32 PM (EDT)*

## 2022-03-30 NOTE — Anesthesia Preprocedure Evaluation (Addendum)
Anesthesia Evaluation  Patient identified by MRN, date of birth, ID band Patient awake    Reviewed: Allergy & Precautions, NPO status , Patient's Chart, lab work & pertinent test results  History of Anesthesia Complications Negative for: history of anesthetic complications  Airway Mallampati: II  TM Distance: >3 FB Neck ROM: Full    Dental no notable dental hx. (+) Dental Advisory Given   Pulmonary neg pulmonary ROS, former smoker,    Pulmonary exam normal        Cardiovascular hypertension, Pt. on medications and Pt. on home beta blockers +CHF  Normal cardiovascular exam  Ehco 8/21 IMPRESSIONS    1. Left ventricular ejection fraction, by estimation, is 60 to 65%. The  left ventricle has normal function. The left ventricle has no regional  wall motion abnormalities. There is moderate left ventricular hypertrophy.  Left ventricular diastolic  parameters are indeterminate.  2. Right ventricular systolic function is normal. The right ventricular  size is normal. Tricuspid regurgitation signal is inadequate for assessing  PA pressure.  3. The mitral valve is normal in structure. Trivial mitral valve  regurgitation.  4. The aortic valve is tricuspid. Aortic valve regurgitation is not  visualized. Mild aortic valve sclerosis is present, with no evidence of  aortic valve stenosis.     Neuro/Psych PSYCHIATRIC DISORDERS Depression negative neurological ROS     GI/Hepatic negative GI ROS, Neg liver ROS,   Endo/Other  diabetes  Renal/GU Renal InsufficiencyRenal disease     Musculoskeletal negative musculoskeletal ROS (+)   Abdominal   Peds  Hematology negative hematology ROS (+)   Anesthesia Other Findings   Reproductive/Obstetrics                            Anesthesia Physical Anesthesia Plan  ASA: 3  Anesthesia Plan: General   Post-op Pain Management: Tylenol PO (pre-op)*,  Celebrex PO (pre-op)* and Minimal or no pain anticipated   Induction: Intravenous  PONV Risk Score and Plan: 2 and Ondansetron and Dexamethasone  Airway Management Planned: LMA  Additional Equipment:   Intra-op Plan:   Post-operative Plan: Extubation in OR  Informed Consent: I have reviewed the patients History and Physical, chart, labs and discussed the procedure including the risks, benefits and alternatives for the proposed anesthesia with the patient or authorized representative who has indicated his/her understanding and acceptance.     Dental advisory given  Plan Discussed with: Anesthesiologist and CRNA  Anesthesia Plan Comments:        Anesthesia Quick Evaluation

## 2022-03-31 ENCOUNTER — Encounter (HOSPITAL_COMMUNITY): Payer: Self-pay | Admitting: Urology

## 2022-03-31 ENCOUNTER — Ambulatory Visit (HOSPITAL_COMMUNITY)
Admission: RE | Admit: 2022-03-31 | Discharge: 2022-03-31 | Disposition: A | Discharge status: Home / Self Care | Payer: Medicare Other | Attending: Urology | Admitting: Urology

## 2022-03-31 ENCOUNTER — Ambulatory Visit (HOSPITAL_BASED_OUTPATIENT_CLINIC_OR_DEPARTMENT_OTHER): Payer: Medicare Other | Admitting: Anesthesiology

## 2022-03-31 ENCOUNTER — Ambulatory Visit (HOSPITAL_COMMUNITY): Payer: Medicare Other | Admitting: Physician Assistant

## 2022-03-31 ENCOUNTER — Encounter (HOSPITAL_COMMUNITY)
Admission: RE | Disposition: A | Discharge status: Home / Self Care | Payer: Self-pay | Source: Home / Self Care | Attending: Urology

## 2022-03-31 ENCOUNTER — Ambulatory Visit (HOSPITAL_COMMUNITY): Payer: Medicare Other

## 2022-03-31 DIAGNOSIS — Q600 Renal agenesis, unilateral: Secondary | ICD-10-CM

## 2022-03-31 DIAGNOSIS — I509 Heart failure, unspecified: Secondary | ICD-10-CM | POA: Diagnosis not present

## 2022-03-31 DIAGNOSIS — N200 Calculus of kidney: Secondary | ICD-10-CM | POA: Insufficient documentation

## 2022-03-31 DIAGNOSIS — F32A Depression, unspecified: Secondary | ICD-10-CM | POA: Insufficient documentation

## 2022-03-31 DIAGNOSIS — I130 Hypertensive heart and chronic kidney disease with heart failure and stage 1 through stage 4 chronic kidney disease, or unspecified chronic kidney disease: Secondary | ICD-10-CM | POA: Insufficient documentation

## 2022-03-31 DIAGNOSIS — Z905 Acquired absence of kidney: Secondary | ICD-10-CM | POA: Diagnosis not present

## 2022-03-31 DIAGNOSIS — N189 Chronic kidney disease, unspecified: Secondary | ICD-10-CM | POA: Insufficient documentation

## 2022-03-31 DIAGNOSIS — I110 Hypertensive heart disease with heart failure: Secondary | ICD-10-CM

## 2022-03-31 DIAGNOSIS — Z8614 Personal history of Methicillin resistant Staphylococcus aureus infection: Secondary | ICD-10-CM

## 2022-03-31 DIAGNOSIS — E1122 Type 2 diabetes mellitus with diabetic chronic kidney disease: Secondary | ICD-10-CM | POA: Insufficient documentation

## 2022-03-31 DIAGNOSIS — Z87891 Personal history of nicotine dependence: Secondary | ICD-10-CM

## 2022-03-31 DIAGNOSIS — K769 Liver disease, unspecified: Secondary | ICD-10-CM

## 2022-03-31 DIAGNOSIS — I10 Essential (primary) hypertension: Secondary | ICD-10-CM

## 2022-03-31 DIAGNOSIS — Z01818 Encounter for other preprocedural examination: Secondary | ICD-10-CM

## 2022-03-31 DIAGNOSIS — E119 Type 2 diabetes mellitus without complications: Secondary | ICD-10-CM

## 2022-03-31 HISTORY — PX: CYSTOSCOPY/URETEROSCOPY/HOLMIUM LASER/STENT PLACEMENT: SHX6546

## 2022-03-31 LAB — GLUCOSE, CAPILLARY
Glucose-Capillary: 118 mg/dL — ABNORMAL HIGH (ref 70–99)
Glucose-Capillary: 124 mg/dL — ABNORMAL HIGH (ref 70–99)
Glucose-Capillary: 134 mg/dL — ABNORMAL HIGH (ref 70–99)

## 2022-03-31 SURGERY — CYSTOSCOPY/URETEROSCOPY/HOLMIUM LASER/STENT PLACEMENT
Anesthesia: General | Site: Penis | Laterality: Left

## 2022-03-31 MED ORDER — PROMETHAZINE HCL 25 MG/ML IJ SOLN: 6.2500 mg | INTRAMUSCULAR | Status: DC | PRN

## 2022-03-31 MED ORDER — PROPOFOL 10 MG/ML IV BOLUS
INTRAVENOUS | Status: AC
  Filled 2022-03-31: qty 20

## 2022-03-31 MED ORDER — PROPOFOL 10 MG/ML IV BOLUS
INTRAVENOUS | Status: DC | PRN
  Administered 2022-03-31: 150 mg via INTRAVENOUS

## 2022-03-31 MED ORDER — LACTATED RINGERS IV SOLN: INTRAVENOUS | Status: DC

## 2022-03-31 MED ORDER — FENTANYL CITRATE PF 50 MCG/ML IJ SOSY: 25.0000 ug | PREFILLED_SYRINGE | INTRAMUSCULAR | Status: DC | PRN

## 2022-03-31 MED ORDER — LIDOCAINE 2% (20 MG/ML) 5 ML SYRINGE
INTRAMUSCULAR | Status: DC | PRN
  Administered 2022-03-31: 60 mg via INTRAVENOUS

## 2022-03-31 MED ORDER — FENTANYL CITRATE (PF) 100 MCG/2ML IJ SOLN
INTRAMUSCULAR | Status: DC | PRN
  Administered 2022-03-31: 25 ug via INTRAVENOUS
  Administered 2022-03-31: 75 ug via INTRAVENOUS

## 2022-03-31 MED ORDER — DEXAMETHASONE SODIUM PHOSPHATE 10 MG/ML IJ SOLN
INTRAMUSCULAR | Status: DC | PRN
  Administered 2022-03-31: 4 mg via INTRAVENOUS

## 2022-03-31 MED ORDER — EPHEDRINE SULFATE-NACL 50-0.9 MG/10ML-% IV SOSY
PREFILLED_SYRINGE | INTRAVENOUS | Status: DC | PRN
  Administered 2022-03-31 (×3): 5 mg via INTRAVENOUS

## 2022-03-31 MED ORDER — DEXAMETHASONE SODIUM PHOSPHATE 10 MG/ML IJ SOLN
INTRAMUSCULAR | Status: AC
  Filled 2022-03-31: qty 1

## 2022-03-31 MED ORDER — AMISULPRIDE (ANTIEMETIC) 5 MG/2ML IV SOLN
10.0000 mg | Freq: Once | INTRAVENOUS | Status: DC | PRN
Start: 2022-03-31 — End: 2022-03-31

## 2022-03-31 MED ORDER — 0.9 % SODIUM CHLORIDE (POUR BTL) OPTIME
TOPICAL | Status: DC | PRN
  Administered 2022-03-31: 1000 mL

## 2022-03-31 MED ORDER — CHLORHEXIDINE GLUCONATE 0.12 % MT SOLN
15.0000 mL | Freq: Once | OROMUCOSAL | Status: AC
  Administered 2022-03-31: 15 mL via OROMUCOSAL

## 2022-03-31 MED ORDER — MIDAZOLAM HCL 2 MG/2ML IJ SOLN
INTRAMUSCULAR | Status: DC | PRN
  Administered 2022-03-31: 2 mg via INTRAVENOUS

## 2022-03-31 MED ORDER — FENTANYL CITRATE (PF) 100 MCG/2ML IJ SOLN
INTRAMUSCULAR | Status: AC
  Filled 2022-03-31: qty 2

## 2022-03-31 MED ORDER — ACETAMINOPHEN 500 MG PO TABS
1000.0000 mg | ORAL_TABLET | Freq: Once | ORAL | Status: AC
  Administered 2022-03-31: 1000 mg via ORAL
  Filled 2022-03-31: qty 2

## 2022-03-31 MED ORDER — EPHEDRINE 5 MG/ML INJ
INTRAVENOUS | Status: AC
Start: 2022-03-31 — End: ?
  Filled 2022-03-31: qty 5

## 2022-03-31 MED ORDER — MIDAZOLAM HCL 2 MG/2ML IJ SOLN
INTRAMUSCULAR | Status: AC
  Filled 2022-03-31: qty 2

## 2022-03-31 MED ORDER — LIDOCAINE HCL (PF) 2 % IJ SOLN
INTRAMUSCULAR | Status: AC
  Filled 2022-03-31: qty 5

## 2022-03-31 MED ORDER — SODIUM CHLORIDE 0.9 % IR SOLN
Status: DC | PRN
  Administered 2022-03-31: 3000 mL

## 2022-03-31 MED ORDER — ONDANSETRON HCL 4 MG/2ML IJ SOLN
INTRAMUSCULAR | Status: AC
  Filled 2022-03-31: qty 2

## 2022-03-31 MED ORDER — CEFAZOLIN SODIUM-DEXTROSE 2-4 GM/100ML-% IV SOLN
2.0000 g | Freq: Once | INTRAVENOUS | Status: AC
  Administered 2022-03-31: 2 g via INTRAVENOUS
  Filled 2022-03-31: qty 100

## 2022-03-31 MED ORDER — ONDANSETRON HCL 4 MG/2ML IJ SOLN
INTRAMUSCULAR | Status: DC | PRN
  Administered 2022-03-31: 4 mg via INTRAVENOUS

## 2022-03-31 MED ORDER — ORAL CARE MOUTH RINSE
15.0000 mL | Freq: Once | OROMUCOSAL | Status: AC
Start: 2022-03-31 — End: 2022-03-31

## 2022-03-31 MED ORDER — IOHEXOL 300 MG/ML  SOLN
INTRAMUSCULAR | Status: DC | PRN
  Administered 2022-03-31: 10 mL

## 2022-03-31 SURGICAL SUPPLY — 27 items
BAG COUNTER SPONGE SURGICOUNT (BAG) IMPLANT
BAG SPNG CNTER NS LX DISP (BAG)
BAG URO CATCHER STRL LF (MISCELLANEOUS) ×1 IMPLANT
BASKET STONE NITINOL 3FRX115MB (UROLOGICAL SUPPLIES) IMPLANT
BASKET ZERO TIP NITINOL 2.4FR (BASKET) IMPLANT
BSKT STON RTRVL ZERO TP 2.4FR (BASKET) ×1
CATH URETL OPEN END 6FR 70 (CATHETERS) IMPLANT
CLOTH BEACON ORANGE TIMEOUT ST (SAFETY) ×1 IMPLANT
GLOVE SURG LX 7.5 STRW (GLOVE) ×1
GLOVE SURG LX STRL 7.5 STRW (GLOVE) ×1 IMPLANT
GOWN STRL REUS W/ TWL XL LVL3 (GOWN DISPOSABLE) ×1 IMPLANT
GOWN STRL REUS W/TWL XL LVL3 (GOWN DISPOSABLE) ×1
GUIDEWIRE STR DUAL SENSOR (WIRE) ×1 IMPLANT
GUIDEWIRE ZIPWRE .038 STRAIGHT (WIRE) IMPLANT
IV NS 1000ML (IV SOLUTION) ×1
IV NS 1000ML BAXH (IV SOLUTION) ×1 IMPLANT
KIT TURNOVER KIT A (KITS) IMPLANT
LASER FIB FLEXIVA PULSE ID 365 (Laser) IMPLANT
MANIFOLD NEPTUNE II (INSTRUMENTS) ×1 IMPLANT
PACK CYSTO (CUSTOM PROCEDURE TRAY) ×1 IMPLANT
SHEATH NAVIGATOR HD 11/13X36 (SHEATH) IMPLANT
SHEATH NAVIGATOR HD 12/14X28 (SHEATH) IMPLANT
STENT URET 6FRX26 CONTOUR (STENTS) IMPLANT
TRACTIP FLEXIVA PULS ID 200XHI (Laser) IMPLANT
TRACTIP FLEXIVA PULSE ID 200 (Laser) ×1
TUBING CONNECTING 10 (TUBING) ×1 IMPLANT
TUBING UROLOGY SET (TUBING) ×1 IMPLANT

## 2022-03-31 NOTE — Transfer of Care (Signed)
Immediate Anesthesia Transfer of Care Note  Patient: Dylan Jefferson  Procedure(s) Performed: CYSTOSCOPY/ RETROGRADE/URETEROSCOPY/HOLMIUM LASER/STENT PLACEMENT (Left: Penis)  Patient Location: PACU  Anesthesia Type:General  Level of Consciousness: awake, alert  and oriented  Airway & Oxygen Therapy: Patient Spontanous Breathing and Patient connected to face mask oxygen  Post-op Assessment: Report given to RN, Post -op Vital signs reviewed and stable and B/P 162/70  Post vital signs: Reviewed and stable  Last Vitals:  Vitals Value Taken Time  BP    Temp    Pulse 50 03/31/22 1021  Resp 13 03/31/22 1021  SpO2 100 % 03/31/22 1021  Vitals shown include unvalidated device data.  Last Pain:  Vitals:   03/31/22 0704  TempSrc:   PainSc: 0-No pain         Complications: No notable events documented.

## 2022-03-31 NOTE — Discharge Instructions (Signed)

## 2022-03-31 NOTE — Anesthesia Postprocedure Evaluation (Signed)
Anesthesia Post Note  Patient: Dylan Jefferson  Procedure(s) Performed: CYSTOSCOPY/ RETROGRADE/URETEROSCOPY/HOLMIUM LASER/STENT PLACEMENT (Left: Penis)     Patient location during evaluation: PACU Anesthesia Type: General Level of consciousness: sedated Pain management: pain level controlled Vital Signs Assessment: post-procedure vital signs reviewed and stable Respiratory status: spontaneous breathing and respiratory function stable Cardiovascular status: stable Postop Assessment: no apparent nausea or vomiting Anesthetic complications: no   No notable events documented.  Last Vitals:  Vitals:   03/31/22 1045 03/31/22 1055  BP: (!) 162/75 (!) 157/86  Pulse: (!) 49 (!) 44  Resp: 18 16  Temp:  (!) 36.1 C  SpO2: 98% 93%    Last Pain:  Vitals:   03/31/22 1055  TempSrc:   PainSc: 0-No pain                 Timber Lucarelli DANIEL

## 2022-03-31 NOTE — Interval H&P Note (Signed)
History and Physical Interval Note:  03/31/2022 7:28 AM  Dylan Jefferson  has presented today for surgery, with the diagnosis of LEFT RENAL STONE.  The various methods of treatment have been discussed with the patient and family. After consideration of risks, benefits and other options for treatment, the patient has consented to  Procedure(s): CYSTOSCOPY/ RETROGRADE/URETEROSCOPY/HOLMIUM LASER/STENT PLACEMENT (Left) as a surgical intervention.  The patient's history has been reviewed, patient examined, no change in status, stable for surgery.  I have reviewed the patient's chart and labs.  Questions were answered to the patient's satisfaction.     [employer]

## 2022-03-31 NOTE — Anesthesia Procedure Notes (Signed)
Procedure Name: LMA Insertion Date/Time: 03/31/2022 8:46 AM  Performed by: Lollie Sails, CRNAPre-anesthesia Checklist: Patient identified, Emergency Drugs available, Suction available, Patient being monitored and Timeout performed Patient Re-evaluated:Patient Re-evaluated prior to induction Oxygen Delivery Method: Circle system utilized Preoxygenation: Pre-oxygenation with 100% oxygen Induction Type: IV induction Ventilation: Mask ventilation without difficulty and Two handed mask ventilation required LMA: LMA inserted LMA Size: 5.0 Number of attempts: 1 Placement Confirmation: positive ETCO2 and breath sounds checked- equal and bilateral Tube secured with: Tape Dental Injury: Teeth and Oropharynx as per pre-operative assessment

## 2022-03-31 NOTE — Op Note (Signed)
Preoperative diagnosis: Left renal calculus, solitary left kidney  Postoperative diagnosis: Left renal calculus, solitary left kidney  Procedure:  Cystoscopy Left ureteroscopy and stone removal Ureteroscopic laser lithotripsy Left ureteral stent placement (6 x 26 - no string) Left retrograde pyelography with interpretation  Surgeon: Pryor Curia. M.D.  Anesthesia: General  Complications: None  Intraoperative findings: Left retrograde pyelography demonstrated a filling defect within the lower left kidney consistent with the patient's known calculus without other abnormalities noted.  EBL: Minimal  Specimens: Left renal calculus  Disposition of specimens: [facility] for stone analysis  Indication: Dylan Jefferson  is a 71 y.o. patient with urolithiasis.  He has a solitary left kidney and an 8 mm lower pole renal calculus. After reviewing the management options for treatment, they elected to proceed with the above surgical procedure(s). We have discussed the potential benefits and risks of the procedure, side effects of the proposed treatment, the likelihood of the patient achieving the goals of the procedure, and any potential problems that might occur during the procedure or recuperation. Informed consent has been obtained.  Description of procedure:  The patient was taken to the operating room and general anesthesia was induced.  The patient was placed in the dorsal lithotomy position, prepped and draped in the usual sterile fashion, and preoperative antibiotics were administered. A preoperative time-out was performed.   Cystourethroscopy was performed.  The patient's urethra was examined and was normal. The bladder was then systematically examined in its entirety. There was no evidence for any bladder tumors, stones, or other mucosal pathology.    Attention then turned to the left ureteral orifice and a ureteral catheter was used to intubate the  ureteral orifice.  Omnipaque contrast was injected through the ureteral catheter and a retrograde pyelogram was performed with findings as dictated above.  A 0.38 sensor guidewire was then advanced up the left ureter into the renal pelvis under fluoroscopic guidance.  A 12/14 Fr ureteral access sheath was then advanced over the guide wire. The digital flexible ureteroscope was then advanced through the access sheath into the ureter next to the guidewire and the calculus was identified and was located in the lower pole of the left kidney.  The stone was in a very difficult to access calyx.  I tried manipulating the stone initially with a standard 0 tip nitinol basket but was unsuccessful.  I then used a modified [employer] Film/video editor and was able to grasp the stone enough to change it's position in the lower pole calyx.  This allowed enough visualization for lithotripsy even though I could not move it completely out of that calyx.   The stone was then fragmented with the 200 micron holmium laser fiber on a setting of 1.2 J and frequency of 8 Hz.  It was an extremely firm stone. Once the stone was fragmented enough, stones were then removed with a zero tip nitinol basket.  There was a remaining fragment measuring 3-4 mm in the lower pole that was not able to be accessed for removal.  It was felt that it was extremely unlikely to be an issue in the future based on it's location.  All other stone fragments were removed.  The safety wire was then replaced and the access sheath removed.  The guidewire was backloaded through the cystoscope and a ureteral stent was advance over the wire using Seldinger technique.  The stent was positioned appropriately under fluoroscopic and cystoscopic guidance.  The wire was then  removed with an adequate stent curl noted in the renal pelvis as well as in the bladder.  The bladder was then emptied and the procedure ended.  The patient appeared to tolerate the procedure well and  without complications.  The patient was able to be awakened and transferred to the recovery unit in satisfactory condition.   Pryor Curia MD

## 2022-04-01 ENCOUNTER — Encounter (HOSPITAL_COMMUNITY): Payer: Self-pay | Admitting: Urology

## 2022-04-04 ENCOUNTER — Ambulatory Visit: Payer: Medicare Other | Admitting: Hematology

## 2022-04-04 ENCOUNTER — Other Ambulatory Visit: Payer: Medicare Other

## 2022-04-10 ENCOUNTER — Other Ambulatory Visit: Payer: Self-pay | Admitting: *Deleted

## 2022-04-10 DIAGNOSIS — C649 Malignant neoplasm of unspecified kidney, except renal pelvis: Secondary | ICD-10-CM

## 2022-04-11 ENCOUNTER — Other Ambulatory Visit: Payer: Self-pay

## 2022-04-11 ENCOUNTER — Inpatient Hospital Stay: Payer: Medicare Other | Admitting: Hematology

## 2022-04-11 ENCOUNTER — Inpatient Hospital Stay: Payer: Medicare Other

## 2022-04-11 ENCOUNTER — Inpatient Hospital Stay: Payer: Medicare Other | Attending: Hematology

## 2022-04-11 VITALS — BP 188/77 | HR 47 | Temp 97.9°F | Resp 17 | Ht 70.0 in | Wt 178.4 lb

## 2022-04-11 DIAGNOSIS — C649 Malignant neoplasm of unspecified kidney, except renal pelvis: Secondary | ICD-10-CM | POA: Diagnosis not present

## 2022-04-11 DIAGNOSIS — C641 Malignant neoplasm of right kidney, except renal pelvis: Secondary | ICD-10-CM | POA: Diagnosis present

## 2022-04-11 DIAGNOSIS — I129 Hypertensive chronic kidney disease with stage 1 through stage 4 chronic kidney disease, or unspecified chronic kidney disease: Secondary | ICD-10-CM | POA: Diagnosis not present

## 2022-04-11 DIAGNOSIS — E1122 Type 2 diabetes mellitus with diabetic chronic kidney disease: Secondary | ICD-10-CM | POA: Diagnosis not present

## 2022-04-11 DIAGNOSIS — R7989 Other specified abnormal findings of blood chemistry: Secondary | ICD-10-CM | POA: Diagnosis not present

## 2022-04-11 DIAGNOSIS — N189 Chronic kidney disease, unspecified: Secondary | ICD-10-CM | POA: Insufficient documentation

## 2022-04-11 DIAGNOSIS — C7800 Secondary malignant neoplasm of unspecified lung: Secondary | ICD-10-CM | POA: Insufficient documentation

## 2022-04-11 DIAGNOSIS — Z87891 Personal history of nicotine dependence: Secondary | ICD-10-CM | POA: Insufficient documentation

## 2022-04-11 LAB — CMP (CANCER CENTER ONLY)
ALT: 28 U/L (ref 0–44)
AST: 27 U/L (ref 15–41)
Albumin: 3.5 g/dL (ref 3.5–5.0)
Alkaline Phosphatase: 138 U/L — ABNORMAL HIGH (ref 38–126)
Anion gap: 6 (ref 5–15)
BUN: 46 mg/dL — ABNORMAL HIGH (ref 8–23)
CO2: 26 mmol/L (ref 22–32)
Calcium: 8.6 mg/dL — ABNORMAL LOW (ref 8.9–10.3)
Chloride: 110 mmol/L (ref 98–111)
Creatinine: 2.85 mg/dL — ABNORMAL HIGH (ref 0.61–1.24)
GFR, Estimated: 23 mL/min — ABNORMAL LOW (ref >60)
Glucose, Bld: 163 mg/dL — ABNORMAL HIGH (ref 70–99)
Potassium: 3.3 mmol/L — ABNORMAL LOW (ref 3.5–5.1)
Sodium: 142 mmol/L (ref 135–145)
Total Bilirubin: 0.8 mg/dL (ref 0.3–1.2)
Total Protein: 6 g/dL — ABNORMAL LOW (ref 6.5–8.1)

## 2022-04-11 LAB — CBC WITH DIFFERENTIAL (CANCER CENTER ONLY)
Abs Immature Granulocytes: 0.02 10*3/uL (ref 0.00–0.07)
Basophils Absolute: 0.1 10*3/uL (ref 0.0–0.1)
Basophils Relative: 1 %
Eosinophils Absolute: 0.2 10*3/uL (ref 0.0–0.5)
Eosinophils Relative: 2 %
HCT: 38.2 % — ABNORMAL LOW (ref 39.0–52.0)
Hemoglobin: 12.7 g/dL — ABNORMAL LOW (ref 13.0–17.0)
Immature Granulocytes: 0 %
Lymphocytes Relative: 17 %
Lymphs Abs: 1.6 10*3/uL (ref 0.7–4.0)
MCH: 33.9 pg (ref 26.0–34.0)
MCHC: 33.2 g/dL (ref 30.0–36.0)
MCV: 101.9 fL — ABNORMAL HIGH (ref 80.0–100.0)
Monocytes Absolute: 1.3 10*3/uL — ABNORMAL HIGH (ref 0.1–1.0)
Monocytes Relative: 14 %
Neutro Abs: 6.2 10*3/uL (ref 1.7–7.7)
Neutrophils Relative %: 66 %
Platelet Count: 245 10*3/uL (ref 150–400)
RBC: 3.75 MIL/uL — ABNORMAL LOW (ref 4.22–5.81)
RDW: 13.4 % (ref 11.5–15.5)
WBC Count: 9.4 10*3/uL (ref 4.0–10.5)
nRBC: 0 % (ref 0.0–0.2)

## 2022-04-11 LAB — IRON AND IRON BINDING CAPACITY (CC-WL,HP ONLY)
Iron: 114 ug/dL (ref 45–182)
Saturation Ratios: 51 % — ABNORMAL HIGH (ref 17.9–39.5)
TIBC: 223 ug/dL — ABNORMAL LOW (ref 250–450)
UIBC: 109 ug/dL — ABNORMAL LOW (ref 117–376)

## 2022-04-11 LAB — FERRITIN: Ferritin: 851 ng/mL — ABNORMAL HIGH (ref 24–336)

## 2022-04-17 NOTE — Progress Notes (Signed)
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 2) Remote history of metastatic renal cell carcinoma Diagnosed with renal cell carcinoma 20 years ago and had a right nephrectomy. About 8 years after that he was noted to have abdominal recurrence in his pancreas spleen and small intestine and had a Whipple's procedure and significant abdominal surgery and notes that 10 out of 17 lymph nodes were positive. He also had his gallbladder removed. Postoperative course was complicated by an internal hemorrhage as per his report. Patient notes 2-3 years after that he had recurrence in his thyroid that led to a thyroidectomy. [June 2004] later he had partial gastrectomy for local recurrence. [February 2005] when he presented with GI bleeding. Patient notes that he has had no known evidence of recurrence over the last 8 years until his recent CT scan showed lung nodules.  Current Treatment: Sutent 37.5 mg po daily for 2 weeks on and 1 weeks off. (on hold now from 10/2020) Previous treatment Sutent on cycle 1 - 50 mg by mouth daily for 4 weeks on and 2-weeks off. It was dose adjusted to 37.5 mg by mouth daily for 2 weeks with 1 week of to help mitigate issues with fatigue, mild hyperbilirubinemia, cytopenias. SBRT to dominant RML nodule.   INTERVAL HISTORY:  Dylan Jefferson is a 71 y.o. male here for follow-up for his metastatic renal cell carcinoma.. Is accompanied by his wife and notes no acute new symptoms since his last clinic visit. He notes that he has been having significant hematuria since his surgery kidney stones and  urinary stenting.  Following with urology for this. No chest pain or shortness of breath. Reasonable p.o. intake. No fevers no chills no night sweats no unexpected weight loss. Continues to have joint pains due to his rheumatoid arthritis.  Labs done today were discussed in detail with the patient.   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 Past Surgical History:  Procedure Laterality Date   CATARACT EXTRACTION Bilateral 2013   CHOLECYSTECTOMY     CYSTOSCOPY/URETEROSCOPY/HOLMIUM LASER/STENT PLACEMENT Left 03/31/2022   Procedure: CYSTOSCOPY/ RETROGRADE/URETEROSCOPY/HOLMIUM LASER/STENT PLACEMENT;  Surgeon: Raynelle Bring, MD;  Location: [facility];  Service: Urology;  Laterality: Left;   GASTRECTOMY     tumor removed   GLAUCOMA SURGERY     Laser surgery   IR US GUIDE VASC ACCESS RIGHT  08/22/2021   IR VENOGRAM HEPATIC W HEMODYNAMIC EVALUATION  08/22/2021  LIPOMA EXCISION Right 1999   NEPHRECTOMY Right    PANCREATECTOMY     SPLENECTOMY     THYROIDECTOMY     URETERAL STENT PLACEMENT Left 2012   WHIPPLE PROCEDURE      . Social History   Tobacco Use   Smoking status: Former    Years: 1.50    Types: Cigarettes   Smokeless tobacco: Never  Vaping Use   Vaping Use: Never used  Substance Use  Topics   Alcohol use: Yes    Comment: occasional beer   Drug use: No    ALLERGIES:  is allergic to carvedilol, iodine, and prozac [fluoxetine hcl].  MEDICATIONS:  Current Outpatient Medications  Medication Sig Dispense Refill   aspirin 81 MG tablet Take 81 mg by mouth daily.  (Patient not taking: Reported on 03/20/2022)     atenolol (TENORMIN) 50 MG tablet Take 50 mg by mouth daily.     augmented betamethasone dipropionate (DIPROLENE-AF) 0.05 % cream Apply 1 application topically daily as needed (rash).   0   azelastine (OPTIVAR) 0.05 % ophthalmic solution Place 1 drop into both eyes in the morning and at bedtime.     calcium citrate-vitamin D (CITRACAL+D) 315-200 MG-UNIT per tablet Take 1 tablet by mouth 2 (two) times daily.     Cholecalciferol (VITAMIN D3) 50 MCG (2000 UT) TABS Take 2,000 Units by mouth daily. 2 Tablets daily     Cyanocobalamin (VITAMIN B-12) 2500 MCG SUBL Take 2,500 mcg by mouth daily.  (Patient not taking: Reported on 03/20/2022)     diclofenac Sodium (VOLTAREN) 1 % GEL Apply 3 g topically 3 (three) times daily as needed (joint pain).      dicyclomine (BENTYL) 10 MG capsule Take 1 capsule (10 mg total) by mouth every 8 (eight) hours as needed for spasms. 30 capsule 1   docusate sodium (COLACE) 100 MG capsule Take 100 mg by mouth 2 (two) times daily as needed for mild constipation.      dorzolamide-timolol (COSOPT) 22.3-6.8 MG/ML ophthalmic solution Place 1 drop into both eyes 2 (two) times daily.   1   famotidine (PEPCID) 20 MG tablet Take 20 mg by mouth 3 (three) times daily.     gabapentin (NEURONTIN) 300 MG capsule Take 300 mg by mouth daily as needed (pain).     Glucosamine-MSM-Hyaluronic Acd 750-375-30 MG TABS Take 2 tablets by mouth daily.     hydrALAZINE (APRESOLINE) 50 MG tablet Take 50 mg by mouth 3 (three) times daily.     HYDROcodone-acetaminophen (NORCO/VICODIN) 5-325 MG tablet Take 1 tablet by mouth every 6 (six) hours as needed for severe pain.     insulin  lispro (HUMALOG) 100 UNIT/ML injection Inject 2-8 Units into the skin 4 (four) times daily -  with meals and at bedtime. Per sliding scale     Lactobacillus (ACIDOPHILUS) CAPS capsule Take 1 capsule by mouth daily.     latanoprost (XALATAN) 0.005 % ophthalmic solution Place 1 drop into both eyes at bedtime.   1   LEVEMIR 100 UNIT/ML injection Inject 8-10 Units into the skin See admin instructions. Inject 8 units into the skin in the morning and 10 units at night  1   levothyroxine (SYNTHROID) 125 MCG tablet Take 250 mcg by mouth daily before breakfast.     losartan (COZAAR) 25 MG tablet Take 25 mg by mouth daily.     lovastatin (MEVACOR) 40 MG tablet Take 40 mg by mouth at bedtime.      Multiple Vitamins-Minerals (  PRESERVISION AREDS 2) CAPS Take 1 capsule by mouth 2 (two) times daily.     Omega-3 Fatty Acids (FISH OIL) 1200 MG CAPS Take 1 capsule by mouth daily. (Patient not taking: Reported on 03/20/2022)     omeprazole (PRILOSEC) 20 MG capsule Take 1 capsule (20 mg total) by mouth daily. (Patient taking differently: Take 20 mg by mouth at bedtime as needed (acid reflux).) 30 capsule 3   Pancrelipase, Lip-Prot-Amyl, 6000-19000 units CPEP Take 4 capsules by mouth 3 (three) times daily.     sildenafil (VIAGRA) 100 MG tablet Take 100 mg by mouth daily as needed for erectile dysfunction.     torsemide (DEMADEX) 20 MG tablet Take 40 mg by mouth daily.     traMADol (ULTRAM) 50 MG tablet Take 50 mg by mouth 2 (two) times daily.     No current facility-administered medications for this visit.    REVIEW OF SYSTEMS:   10 Point review of Systems was done is negative except as noted above.  PHYSICAL EXAMINATION: ECOG FS:1 - Symptomatic but completely ambulatory  Vitals:   04/11/22 1045  BP: (!) 188/77  Pulse: (!) 47  Resp: 17  Temp: 97.9 F (36.6 C)  SpO2: 97%    Wt Readings from Last 3 Encounters:  04/11/22 178 lb 6.4 oz (80.9 kg)  03/31/22 177 lb 0.5 oz (80.3 kg)  03/21/22 177 lb (80.3 kg)    Body mass index is 25.6 kg/m.      NAD GENERAL:alert, in no acute distress and comfortable SKIN: no acute rashes, no significant lesions EYES: conjunctiva are pink and non-injected, sclera anicteric OROPHARYNX: MMM, no exudates, no oropharyngeal erythema or ulceration NECK: supple, no JVD LYMPH:  no palpable lymphadenopathy in the cervical, axillary or inguinal regions LUNGS: clear to auscultation b/l with normal respiratory effort HEART: regular rate & rhythm ABDOMEN:  normoactive bowel sounds , non tender, not distended. Extremity: no pedal edema PSYCH: alert & oriented x 3 with fluent speech NEURO: no focal motor/sensory deficits   LABORATORY DATA:   I have reviewed the data as listed  .    Latest Ref Rng & Units 04/11/2022   10:31 AM 03/21/2022   11:31 AM 01/02/2022    8:03 AM  CBC  WBC 4.0 - 10.5 K/uL 9.4  7.5  8.9   Hemoglobin 13.0 - 17.0 g/dL 12.7  13.7  12.3   Hematocrit 39.0 - 52.0 % 38.2  43.5  37.3   Platelets 150 - 400 K/uL 245  248  220    . CBC    Component Value Date/Time   WBC 9.4 04/11/2022 1031   WBC 7.5 03/21/2022 1131   RBC 3.75 (L) 04/11/2022 1031   HGB 12.7 (L) 04/11/2022 1031   HGB 14.0 06/11/2017 0808   HCT 38.2 (L) 04/11/2022 1031   HCT 40.7 06/11/2017 0808   PLT 245 04/11/2022 1031   PLT 223 06/11/2017 0808   MCV 101.9 (H) 04/11/2022 1031   MCV 112.1 (H) 06/11/2017 0808   MCH 33.9 04/11/2022 1031   MCHC 33.2 04/11/2022 1031   RDW 13.4 04/11/2022 1031   RDW 13.9 06/11/2017 0808   LYMPHSABS 1.6 04/11/2022 1031   LYMPHSABS 1.6 06/11/2017 0808   MONOABS 1.3 (H) 04/11/2022 1031   MONOABS 0.5 06/11/2017 0808   EOSABS 0.2 04/11/2022 1031   EOSABS 0.1 06/11/2017 0808   BASOSABS 0.1 04/11/2022 1031   BASOSABS 0.0 06/11/2017 0808   .    Latest Ref Rng & Units 04/11/2022  10:31 AM 03/21/2022   11:31 AM 01/02/2022    8:03 AM  CMP  Glucose 70 - 99 mg/dL 163  69  95   BUN 8 - 23 mg/dL 46  39  40   Creatinine 0.61 - 1.24 mg/dL 2.85  2.49   2.54   Sodium 135 - 145 mmol/L 142  139  142   Potassium 3.5 - 5.1 mmol/L 3.3  3.7  3.9   Chloride 98 - 111 mmol/L 110  109  111   CO2 22 - 32 mmol/L '26  25  24   '$ Calcium 8.9 - 10.3 mg/dL 8.6  8.3  8.7   Total Protein 6.5 - 8.1 g/dL 6.0  6.3  5.9   Total Bilirubin 0.3 - 1.2 mg/dL 0.8  0.9  0.7   Alkaline Phos 38 - 126 U/L 138  152  157   AST 15 - 41 U/L 27  41  29   ALT 0 - 44 U/L 28  39  30    08/18/2019 CT Abdomen Pelvis Wo Contrast (Accession [id number])   08/18/2019 CT Chest Wo Contrast (Accession [id number])   RADIOGRAPHIC STUDIES: I have personally reviewed the radiological images as listed and agreed with the findings in the report. DG C-Arm 1-60 Min-No Report  Result Date: 03/31/2022 Fluoroscopy was utilized by the requesting physician.  No radiographic interpretation.   DG C-Arm 1-60 Min-No Report  Result Date: 03/31/2022 Fluoroscopy was utilized by the requesting physician.  No radiographic interpretation.      ASSESSMENT & PLAN:   71 y.o. Caucasian male with  #1 Recurrent metastatic Renal cell carcinoma with multiple lung metastases.  Has been on stable on 1st line Sutent for >58yr Has had SBRT to the dominant right lung nodule with partial response. No lab or clinical evidence of metastatic RCC progression at this time.  PET/CT 08/08/2016 with a couple of mildly enlarge LLL pulmonary nodules - no other overt evidence of RCC progression at this time.  PET CT scan 11/06/2016 - no overt evidence of RCC progression at this time .  PET/CT 03/13/2017 - no overt evidence of RCC progression at this time .  PET/CT 08/10/2017 - no overt evidence of RCC progression at this time .  PET/ CT done 08/10/2017, shows no evidence of  Progression at this time.  12/07/17 PET which revealed No findings suspicious for recurrent or metastatic disease.  03/22/18 PET/CT revealed One of the left lower lobe nodules has very minimally enlarged over the last 2 years, currently 0.6 by 0.4 cm and  measuring 0.4 by 0.3 cm on 03/20/2016. This probably represents a previously treated metastatic nodule given that it measured 1.0 by 0.8 cm on 06/06/2015. This nodule may be subject to some slice selection phenomenon and also motion artifact given proximity to the diaphragm. Surveillance is likely warranted. 2. Chronic central lucency, marginal bony deposition, and slow healing of the right lateral fourth rib fracture. Possibilities may include underlying radiation necrosis as a cause for fracture leading to slow healing, or a low-grade pathologic lesion. Maximum SUV at this fracture site is 2.9, previously 2.4. 3. 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 There is only low-grade activity in this vicinity characteristic of radiation fibrosis, without focal activity. 4. Other imaging findings of potential clinical significance: Aortic Atherosclerosis. Coronary atherosclerosis. Mild cardiomegaly. Right nephrectomy. 8 mm nonobstructive calculus in the left kidney lower pole.   08/24/18  CT C/A/P revealed "Stable small left lower lobe pulmonary nodules from recent prior studies. As previously noted, these have decreased in size from PET-CT 06/06/2015, likely treated metastases. No new or enlarging pulmonary nodules. 2. Postsurgical changes in the upper abdomen without evidence of local recurrence or metastatic disease. 3. Nonobstructing left renal calculus. 4. Stable radiation changes in the right upper lobe with probable chronic pathologic fracture of the right 4th rib laterally."  03/07/2019 CT C/A wo contrast revealed "1. Stable left lower lobe pulmonary nodules, likely treated Metastases. 2. No additional evidence of metastatic disease. 3. Left renal stone. 4. Post radiation scarring in the right hemithorax with a healed adjacent right rib fracture which is presumably pathologic in Etiology. 5.  Aortic atherosclerosis (ICD10-170.0). 6.  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 Estanislado Pandy for mx #6 DM2 -continue f/u with Dr Philip Aspen for continue mx, blood sugar at home per patient.  #7postsplenectomy status #8 abnormal liver function tests.  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  Plan Labs done today were discussed in detail with the patient CBC stable CMP with chronic kidney disease   -Labs done today were discussed in detail with the patient CBC stable CMP shows improvement in creatinine which is down to 2.54 LDH stable at 237 Ferritin at 851 He has been having significant hematuria due to his stone extraction procedure and stenting.  His hemoglobin is dropped by 1 g and given ongoing hematuria we should hold off on his therapeutic phlebotomy at this time. -He was recommended to minimize p.o. iron intake.  No oral iron supplements. -Patient has no clinical signs or symptoms of progression of his metastatic renal cell carcinoma. -Maintain good hydration -Continue to optimize diabetes management with PCP -No indication for active treatment of the patient's RCC at this time  Follow-up Labs in 11 weeks CT CAP in 11 weeks RTC with Dr Irene Limbo with appointment for therapeutic phlebotomy in 12 week  The total time spent in the appointment was 21 minutes*.  All of the patient's questions were answered with apparent satisfaction. The patient knows to call the clinic with any problems, questions or concerns.   Sullivan Lone MD MS AAHIVMS [facility] Hematology/Oncology Physician [facility]  .*Total Encounter Time as defined by the Centers for Medicare and Medicaid  Services includes, in addition to the face-to-face time of a patient visit (documented in the note above) non-face-to-face time: obtaining and reviewing outside history, ordering and reviewing medications, tests or procedures, care coordination (communications with other health care professionals or caregivers) and documentation in the medical record.

## 2022-04-17 NOTE — Progress Notes (Incomplete)
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 2) Remote history of metastatic renal cell carcinoma Diagnosed with renal cell carcinoma 20 years ago and had a right nephrectomy. About 8 years after that he was noted to have abdominal recurrence in his pancreas spleen and small intestine and had a Whipple's procedure and significant abdominal surgery and notes that 10 out of 17 lymph nodes were positive. He also had his gallbladder removed. Postoperative course was complicated by an internal hemorrhage as per his report. Patient notes 2-3 years after that he had recurrence in his thyroid that led to a thyroidectomy. [June 2004] later he had partial gastrectomy for local recurrence. [February 2005] when he presented with GI bleeding. Patient notes that he has had no known evidence of recurrence over the last 8 years until his recent CT scan showed lung nodules.  Current Treatment: Sutent 37.5 mg po daily for 2 weeks on and 1 weeks off. (on hold now from 10/2020) Previous treatment Sutent on cycle 1 - 50 mg by mouth daily for 4 weeks on and 2-weeks off. It was dose adjusted to 37.5 mg by mouth daily for 2 weeks with 1 week of to help mitigate issues with fatigue, mild hyperbilirubinemia, cytopenias. SBRT to dominant RML nodule.   INTERVAL HISTORY:  [name] is a 71 y.o. male here for follow-up for his metastatic renal cell carcinoma. He is accompanied by his wife and notes no acute new symptoms since his last clinic visit.Marland Kitchen      Past Medical History:  Diagnosis Date  . Arthritis   . Bradycardia   .  [facility])    Renal cell  . DDD (degenerative disc disease), cervical 05/28/2016  . DDD (degenerative disc disease), lumbar 05/28/2016  . Depression   . Diabetes ([place])   . Hyperlipidemia 05/28/2016  . Hypertension   . Hypothyroidism   . 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 Kidney disease   . Macular degeneration    Bilateral eyes  . Nephrolithiasis   . Osteoarthritis of both hands 05/28/2016  . Osteoarthritis of both knees 05/28/2016  . Peripheral vascular disorder ([place]) 05/28/2016  . Peyronie's syndrome   . 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 Rheumatoid arthritis ([place])   . Thyroid cancer ([place]) 05/28/2016    . Past Surgical History:  Procedure Laterality Date  . CATARACT EXTRACTION Bilateral 2013  . CHOLECYSTECTOMY    . CYSTOSCOPY/URETEROSCOPY/HOLMIUM LASER/STENT PLACEMENT Left 03/31/2022   Procedure: CYSTOSCOPY/ RETROGRADE/URETEROSCOPY/HOLMIUM LASER/STENT PLACEMENT;  Surgeon: Raynelle Bring, MD;  Location: [facility];  Service: Urology;  Laterality: Left;  Marland Kitchen GASTRECTOMY     tumor removed  . GLAUCOMA SURGERY     Laser surgery  . IR US GUIDE VASC ACCESS RIGHT  08/22/2021  . IR VENOGRAM HEPATIC W HEMODYNAMIC EVALUATION  08/22/2021  . LIPOMA EXCISION Right 1999  . NEPHRECTOMY Right   . PANCREATECTOMY    . SPLENECTOMY    . THYROIDECTOMY    . URETERAL STENT PLACEMENT Left 2012  . WHIPPLE PROCEDURE      . Social History   Tobacco Use  . Smoking status: Former    Years: 1.50  Types: Cigarettes  . Smokeless tobacco: Never  Vaping Use  . Vaping Use: Never used  Substance Use Topics  . Alcohol use: Yes    Comment: occasional beer  . Drug use: No    ALLERGIES:  is allergic to carvedilol, iodine, and prozac [fluoxetine hcl].  MEDICATIONS:  Current Outpatient Medications  Medication Sig Dispense Refill  . aspirin 81 MG tablet Take 81 mg by mouth daily.  (Patient not taking: Reported on 03/20/2022)    . atenolol  (TENORMIN) 50 MG tablet Take 50 mg by mouth daily.    Marland Kitchen augmented betamethasone dipropionate (DIPROLENE-AF) 0.05 % cream Apply 1 application topically daily as needed (rash).   0  . azelastine (OPTIVAR) 0.05 % ophthalmic solution Place 1 drop into both eyes in the morning and at bedtime.    . calcium citrate-vitamin D (CITRACAL+D) 315-200 MG-UNIT per tablet Take 1 tablet by mouth 2 (two) times daily.    . Cholecalciferol (VITAMIN D3) 50 MCG (2000 UT) TABS Take 2,000 Units by mouth daily. 2 Tablets daily    . Cyanocobalamin (VITAMIN B-12) 2500 MCG SUBL Take 2,500 mcg by mouth daily.  (Patient not taking: Reported on 03/20/2022)    . diclofenac Sodium (VOLTAREN) 1 % GEL Apply 3 g topically 3 (three) times daily as needed (joint pain).     Marland Kitchen dicyclomine (BENTYL) 10 MG capsule Take 1 capsule (10 mg total) by mouth every 8 (eight) hours as needed for spasms. 30 capsule 1  . docusate sodium (COLACE) 100 MG capsule Take 100 mg by mouth 2 (two) times daily as needed for mild constipation.     . dorzolamide-timolol (COSOPT) 22.3-6.8 MG/ML ophthalmic solution Place 1 drop into both eyes 2 (two) times daily.   1  . famotidine (PEPCID) 20 MG tablet Take 20 mg by mouth 3 (three) times daily.    Marland Kitchen gabapentin (NEURONTIN) 300 MG capsule Take 300 mg by mouth daily as needed (pain).    . Glucosamine-MSM-Hyaluronic Acd 750-375-30 MG TABS Take 2 tablets by mouth daily.    . hydrALAZINE (APRESOLINE) 50 MG tablet Take 50 mg by mouth 3 (three) times daily.    Marland Kitchen HYDROcodone-acetaminophen (NORCO/VICODIN) 5-325 MG tablet Take 1 tablet by mouth every 6 (six) hours as needed for severe pain.    Marland Kitchen insulin lispro (HUMALOG) 100 UNIT/ML injection Inject 2-8 Units into the skin 4 (four) times daily -  with meals and at bedtime. Per sliding scale    . Lactobacillus (ACIDOPHILUS) CAPS capsule Take 1 capsule by mouth daily.    Marland Kitchen latanoprost (XALATAN) 0.005 % ophthalmic solution Place 1 drop into both eyes at bedtime.   1  . LEVEMIR  100 UNIT/ML injection Inject 8-10 Units into the skin See admin instructions. Inject 8 units into the skin in the morning and 10 units at night  1  . levothyroxine (SYNTHROID) 125 MCG tablet Take 250 mcg by mouth daily before breakfast.    . losartan (COZAAR) 25 MG tablet Take 25 mg by mouth daily.    Marland Kitchen lovastatin (MEVACOR) 40 MG tablet Take 40 mg by mouth at bedtime.     . Multiple Vitamins-Minerals (PRESERVISION AREDS 2) CAPS Take 1 capsule by mouth 2 (two) times daily.    . Omega-3 Fatty Acids (FISH OIL) 1200 MG CAPS Take 1 capsule by mouth daily. (Patient not taking: Reported on 03/20/2022)    . omeprazole (PRILOSEC) 20 MG capsule Take 1 capsule (20 mg total) by mouth daily. (Patient taking differently: Take 20  mg by mouth at bedtime as needed (acid reflux).) 30 capsule 3  . Pancrelipase, Lip-Prot-Amyl, 6000-19000 units CPEP Take 4 capsules by mouth 3 (three) times daily.    . sildenafil (VIAGRA) 100 MG tablet Take 100 mg by mouth daily as needed for erectile dysfunction.    . torsemide (DEMADEX) 20 MG tablet Take 40 mg by mouth daily.    . traMADol (ULTRAM) 50 MG tablet Take 50 mg by mouth 2 (two) times daily.     No current facility-administered medications for this visit.    REVIEW OF SYSTEMS:   10 Point review of Systems was done is negative except as noted above.  PHYSICAL EXAMINATION: ECOG FS:1 - Symptomatic but completely ambulatory  Vitals:   04/11/22 1045  BP: (!) 188/77  Pulse: (!) 47  Resp: 17  Temp: 97.9 F (36.6 C)  SpO2: 97%    Wt Readings from Last 3 Encounters:  04/11/22 178 lb 6.4 oz (80.9 kg)  03/31/22 177 lb 0.5 oz (80.3 kg)  03/21/22 177 lb (80.3 kg)   Body mass index is 25.6 kg/m.      NAD GENERAL:alert, in no acute distress and comfortable SKIN: no acute rashes, no significant lesions EYES: conjunctiva are pink and non-injected, sclera anicteric OROPHARYNX: MMM, no exudates, no oropharyngeal erythema or ulceration NECK: supple, no JVD LYMPH:  no  palpable lymphadenopathy in the cervical, axillary or inguinal regions LUNGS: clear to auscultation b/l with normal respiratory effort HEART: regular rate & rhythm ABDOMEN:  normoactive bowel sounds , non tender, not distended. Extremity: no pedal edema PSYCH: alert & oriented x 3 with fluent speech NEURO: no focal motor/sensory deficits   LABORATORY DATA:   I have reviewed the data as listed  .    Latest Ref Rng & Units 04/11/2022   10:31 AM 03/21/2022   11:31 AM 01/02/2022    8:03 AM  CBC  WBC 4.0 - 10.5 K/uL 9.4  7.5  8.9   Hemoglobin 13.0 - 17.0 g/dL 12.7  13.7  12.3   Hematocrit 39.0 - 52.0 % 38.2  43.5  37.3   Platelets 150 - 400 K/uL 245  248  220    . CBC    Component Value Date/Time   WBC 9.4 04/11/2022 1031   WBC 7.5 03/21/2022 1131   RBC 3.75 (L) 04/11/2022 1031   HGB 12.7 (L) 04/11/2022 1031   HGB 14.0 06/11/2017 0808   HCT 38.2 (L) 04/11/2022 1031   HCT 40.7 06/11/2017 0808   PLT 245 04/11/2022 1031   PLT 223 06/11/2017 0808   MCV 101.9 (H) 04/11/2022 1031   MCV 112.1 (H) 06/11/2017 0808   MCH 33.9 04/11/2022 1031   MCHC 33.2 04/11/2022 1031   RDW 13.4 04/11/2022 1031   RDW 13.9 06/11/2017 0808   LYMPHSABS 1.6 04/11/2022 1031   LYMPHSABS 1.6 06/11/2017 0808   MONOABS 1.3 (H) 04/11/2022 1031   MONOABS 0.5 06/11/2017 0808   EOSABS 0.2 04/11/2022 1031   EOSABS 0.1 06/11/2017 0808   BASOSABS 0.1 04/11/2022 1031   BASOSABS 0.0 06/11/2017 0808   .    Latest Ref Rng & Units 04/11/2022   10:31 AM 03/21/2022   11:31 AM 01/02/2022    8:03 AM  CMP  Glucose 70 - 99 mg/dL 163  69  95   BUN 8 - 23 mg/dL 46  39  40   Creatinine 0.61 - 1.24 mg/dL 2.85  2.49  2.54   Sodium 135 - 145 mmol/L 142  139  142   Potassium 3.5 - 5.1 mmol/L 3.3  3.7  3.9   Chloride 98 - 111 mmol/L 110  109  111   CO2 22 - 32 mmol/L '26  25  24   '$ Calcium 8.9 - 10.3 mg/dL 8.6  8.3  8.7   Total Protein 6.5 - 8.1 g/dL 6.0  6.3  5.9   Total Bilirubin 0.3 - 1.2 mg/dL 0.8  0.9  0.7   Alkaline  Phos 38 - 126 U/L 138  152  157   AST 15 - 41 U/L 27  41  29   ALT 0 - 44 U/L 28  39  30    08/18/2019 CT Abdomen Pelvis Wo Contrast (Accession [id number])   08/18/2019 CT Chest Wo Contrast (Accession [id number])   RADIOGRAPHIC STUDIES: I have personally reviewed the radiological images as listed and agreed with the findings in the report. DG C-Arm 1-60 Min-No Report  Result Date: 03/31/2022 Fluoroscopy was utilized by the requesting physician.  No radiographic interpretation.   DG C-Arm 1-60 Min-No Report  Result Date: 03/31/2022 Fluoroscopy was utilized by the requesting physician.  No radiographic interpretation.      ASSESSMENT & PLAN:   71 y.o. Caucasian male with  #1 Recurrent metastatic Renal cell carcinoma with multiple lung metastases.  Has been on stable on 1st line Sutent for >61yr Has had SBRT to the dominant right lung nodule with partial response. No lab or clinical evidence of metastatic RCC progression at this time.  PET/CT 08/08/2016 with a couple of mildly enlarge LLL pulmonary nodules - no other overt evidence of RCC progression at this time.  PET CT scan 11/06/2016 - no overt evidence of RCC progression at this time .  PET/CT 03/13/2017 - no overt evidence of RCC progression at this time .  PET/CT 08/10/2017 - no overt evidence of RCC progression at this time .  PET/ CT done 08/10/2017, shows no evidence of  Progression at this time.  12/07/17 PET which revealed No findings suspicious for recurrent or metastatic disease.  03/22/18 PET/CT revealed One of the left lower lobe nodules has very minimally enlarged over the last 2 years, currently 0.6 by 0.4 cm and measuring 0.4 by 0.3 cm on 03/20/2016. This probably represents a previously treated metastatic nodule given that it measured 1.0 by 0.8 cm on 06/06/2015. This nodule may be subject to some slice selection phenomenon and also motion artifact given proximity to the diaphragm. Surveillance is likely warranted. 2.  Chronic central lucency, marginal bony deposition, and slow healing of the right lateral fourth rib fracture. Possibilities may include underlying radiation necrosis as a cause for fracture leading to slow healing, or a low-grade pathologic lesion. Maximum SUV at this fracture site is 2.9, previously 2.4. 3. 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 There is only low-grade activity in this vicinity characteristic of radiation fibrosis, without focal activity. 4. Other imaging findings of potential clinical significance: Aortic Atherosclerosis. Coronary atherosclerosis. Mild cardiomegaly. Right nephrectomy. 8 mm nonobstructive calculus in the left kidney lower pole.   08/24/18 CT C/A/P revealed "Stable small left lower lobe pulmonary nodules from recent prior studies. As previously noted, these have decreased in size from PET-CT 06/06/2015, likely treated metastases. No new or enlarging pulmonary nodules. 2. Postsurgical changes in the upper abdomen without evidence of local recurrence or metastatic disease. 3. Nonobstructing left renal calculus. 4. Stable radiation changes in  the right upper lobe with probable chronic pathologic fracture of the right 4th rib laterally."  03/07/2019 CT C/A wo contrast revealed "1. Stable left lower lobe pulmonary nodules, likely treated Metastases. 2. No additional evidence of metastatic disease. 3. Left renal stone. 4. Post radiation scarring in the right hemithorax with a healed adjacent right rib fracture which is presumably pathologic in Etiology. 5.  Aortic atherosclerosis (ICD10-170.0). 6. 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 Estanislado Pandy for mx #6 DM2 -continue f/u with  Dr Philip Aspen for continue mx, blood sugar at home per patient.  #7postsplenectomy status #8 abnormal liver function tests.  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  Plan -Labs done today were discussed in detail with the patient CBC stable CMP shows improvement in creatinine which is down to 2.54 LDH stable at 237 Ferritin at 823 Patient shall be proceeding with his first therapeutic phlebotomy limited at 300 mL to monitor for tolerance. -He will continue to avoid any iron replacement. -Maintain good hydration -Continue to optimize diabetes management with PCP -No indication for active treatment of the patient's RCC at this time  Follow-up Return to clinic with Dr. Irene Limbo with labs and appointment for therapeutic phlebotomy in 3 months  The total time spent in the appointment was 20 minutes*.  All of the patient's questions were answered with apparent satisfaction. The patient knows to call the clinic with any problems, questions or concerns.   Sullivan Lone MD MS AAHIVMS [facility] Hematology/Oncology Physician [facility]  .*Total Encounter Time as defined by the Centers for Medicare and Medicaid Services includes, in addition to the face-to-face time of a patient visit (documented in the note above) non-face-to-face time: obtaining and reviewing outside history, ordering and reviewing medications, tests or procedures, care coordination (communications with other health care professionals or caregivers) and documentation in the medical record.  I, Melene Muller, am acting as scribe for Dr. Sullivan Lone, MD. .I have reviewed the above documentation for accuracy and completeness, and I agree with the above. Brunetta Genera MD

## 2022-04-18 ENCOUNTER — Encounter: Payer: Self-pay | Admitting: Hematology

## 2022-07-14 ENCOUNTER — Other Ambulatory Visit: Payer: Medicare Other

## 2022-07-16 ENCOUNTER — Ambulatory Visit: Payer: Medicare Other | Admitting: Hematology

## 2022-07-31 ENCOUNTER — Emergency Department (HOSPITAL_BASED_OUTPATIENT_CLINIC_OR_DEPARTMENT_OTHER): Payer: Medicare Other

## 2022-07-31 ENCOUNTER — Emergency Department (HOSPITAL_BASED_OUTPATIENT_CLINIC_OR_DEPARTMENT_OTHER)
Admission: EM | Admit: 2022-07-31 | Discharge: 2022-07-31 | Disposition: A | Discharge status: Home / Self Care | Payer: Medicare Other | Attending: Emergency Medicine | Admitting: Emergency Medicine

## 2022-07-31 ENCOUNTER — Other Ambulatory Visit: Payer: Self-pay

## 2022-07-31 ENCOUNTER — Encounter (HOSPITAL_BASED_OUTPATIENT_CLINIC_OR_DEPARTMENT_OTHER): Payer: Self-pay | Admitting: Emergency Medicine

## 2022-07-31 ENCOUNTER — Other Ambulatory Visit: Payer: Self-pay | Admitting: Adult Health

## 2022-07-31 DIAGNOSIS — Z794 Long term (current) use of insulin: Secondary | ICD-10-CM | POA: Diagnosis not present

## 2022-07-31 DIAGNOSIS — R1031 Right lower quadrant pain: Secondary | ICD-10-CM

## 2022-07-31 DIAGNOSIS — Z7982 Long term (current) use of aspirin: Secondary | ICD-10-CM | POA: Insufficient documentation

## 2022-07-31 LAB — URINALYSIS, ROUTINE W REFLEX MICROSCOPIC
Bacteria, UA: NONE SEEN
Bilirubin Urine: NEGATIVE
Glucose, UA: NEGATIVE mg/dL
Hgb urine dipstick: NEGATIVE
Ketones, ur: NEGATIVE mg/dL
Leukocytes,Ua: NEGATIVE
Nitrite: NEGATIVE
Protein, ur: 100 mg/dL — AB
Specific Gravity, Urine: 1.012 (ref 1.005–1.030)
pH: 5.5 (ref 5.0–8.0)

## 2022-07-31 LAB — COMPREHENSIVE METABOLIC PANEL
ALT: 30 U/L (ref 0–44)
AST: 26 U/L (ref 15–41)
Albumin: 4 g/dL (ref 3.5–5.0)
Alkaline Phosphatase: 134 U/L — ABNORMAL HIGH (ref 38–126)
Anion gap: 15 (ref 5–15)
BUN: 53 mg/dL — ABNORMAL HIGH (ref 8–23)
CO2: 23 mmol/L (ref 22–32)
Calcium: 8.7 mg/dL — ABNORMAL LOW (ref 8.9–10.3)
Chloride: 105 mmol/L (ref 98–111)
Creatinine, Ser: 3.28 mg/dL — ABNORMAL HIGH (ref 0.61–1.24)
GFR, Estimated: 19 mL/min — ABNORMAL LOW (ref >60)
Glucose, Bld: 200 mg/dL — ABNORMAL HIGH (ref 70–99)
Potassium: 3.6 mmol/L (ref 3.5–5.1)
Sodium: 143 mmol/L (ref 135–145)
Total Bilirubin: 0.8 mg/dL (ref 0.3–1.2)
Total Protein: 6.6 g/dL (ref 6.5–8.1)

## 2022-07-31 LAB — CBC
HCT: 42.6 % (ref 39.0–52.0)
Hemoglobin: 13.9 g/dL (ref 13.0–17.0)
MCH: 34.2 pg — ABNORMAL HIGH (ref 26.0–34.0)
MCHC: 32.6 g/dL (ref 30.0–36.0)
MCV: 104.7 fL — ABNORMAL HIGH (ref 80.0–100.0)
Platelets: 221 10*3/uL (ref 150–400)
RBC: 4.07 MIL/uL — ABNORMAL LOW (ref 4.22–5.81)
RDW: 13 % (ref 11.5–15.5)
WBC: 8 10*3/uL (ref 4.0–10.5)
nRBC: 0 % (ref 0.0–0.2)

## 2022-07-31 LAB — LIPASE, BLOOD: Lipase: <10 U/L — ABNORMAL LOW (ref 11–51)

## 2022-07-31 MED ORDER — ONDANSETRON HCL 4 MG/2ML IJ SOLN
4.0000 mg | Freq: Once | INTRAMUSCULAR | Status: AC
  Administered 2022-07-31: 4 mg via INTRAVENOUS
  Filled 2022-07-31: qty 2

## 2022-07-31 MED ORDER — SODIUM CHLORIDE 0.9 % IV BOLUS
1000.0000 mL | Freq: Once | INTRAVENOUS | Status: AC
  Administered 2022-07-31: 1000 mL via INTRAVENOUS

## 2022-07-31 MED ORDER — MORPHINE SULFATE (PF) 4 MG/ML IV SOLN
4.0000 mg | Freq: Once | INTRAVENOUS | Status: AC
  Administered 2022-07-31: 4 mg via INTRAVENOUS
  Filled 2022-07-31: qty 1

## 2022-07-31 NOTE — ED Triage Notes (Signed)
RUQ pain  Pcp concerned appy. Started yesterday

## 2022-07-31 NOTE — Discharge Instructions (Signed)
There was no obvious cause to your discomfort on labs or CT.  Please follow-up with your family doctor in the office.  Please return for worsening symptoms fever inability to eat or drink.

## 2022-07-31 NOTE — ED Provider Notes (Signed)
[facility] EMERGENC[facility] DEPT Provider Note   CSN: [id number] Arrival date & time: 07/31/22  1526     History  Chief Complaint  Patient presents with   Abdominal Pain    Dylan Jefferson is a 71 y.o. male.  71 yo M with a chief complaint of right lower quadrant abdominal pain.  Going on for about 24 hours.  A little bit of pain yesterday that resolved and then recurred this morning.  Had some breakfast but otherwise has not anything to eat.  No nausea or vomiting.  Pain seems to come and go.  Progressively worsening.  No constipation or diarrhea.  History of Whipple procedure.   Abdominal Pain      Home Medications Prior to Admission medications   Medication Sig Start Date End Date Taking? Authorizing Provider  aspirin 81 MG tablet Take 81 mg by mouth daily.  Patient not taking: Reported on 03/20/2022    [provider]  atenolol (TENORMIN) 50 MG tablet Take 50 mg by mouth daily. 12/02/21   [provider]  augmented betamethasone dipropionate (DIPROLENE-AF) 0.05 % cream Apply 1 application topically daily as needed (rash).  03/05/15   [provider]  azelastine (OPTIVAR) 0.05 % ophthalmic solution Place 1 drop into both eyes in the morning and at bedtime. 12/11/20   [provider]  calcium citrate-vitamin D (CITRACAL+D) 315-200 MG-UNIT per tablet Take 1 tablet by mouth 2 (two) times daily.    [provider]  Cholecalciferol (VITAMIN D3) 50 MCG (2000 UT) TABS Take 2,000 Units by mouth daily. 2 Tablets daily    [provider]  Cyanocobalamin (VITAMIN B-12) 2500 MCG SUBL Take 2,500 mcg by mouth daily.  Patient not taking: Reported on 03/20/2022    [provider]  diclofenac Sodium (VOLTAREN) 1 % GEL Apply 3 g topically 3 (three) times daily as needed (joint pain).  02/09/20   [provider]  dicyclomine (BENTYL) 10 MG capsule Take 1 capsule (10 mg total) by mouth every 8 (eight) hours as needed for  spasms. 12/13/20   Armbruster, Carlota Raspberry, MD  docusate sodium (COLACE) 100 MG capsule Take 100 mg by mouth 2 (two) times daily as needed for mild constipation.     [provider]  dorzolamide-timolol (COSOPT) 22.3-6.8 MG/ML ophthalmic solution Place 1 drop into both eyes 2 (two) times daily.  11/08/15   [provider]  famotidine (PEPCID) 20 MG tablet Take 20 mg by mouth 3 (three) times daily.    [provider]  gabapentin (NEURONTIN) 300 MG capsule Take 300 mg by mouth daily as needed (pain). 05/06/20   [provider]  Glucosamine-MSM-Hyaluronic Acd 750-375-30 MG TABS Take 2 tablets by mouth daily.    [provider]  hydrALAZINE (APRESOLINE) 50 MG tablet Take 50 mg by mouth 3 (three) times daily. 04/25/21   [provider]  HYDROcodone-acetaminophen (NORCO/VICODIN) 5-325 MG tablet Take 1 tablet by mouth every 6 (six) hours as needed for severe pain. 10/15/20   [provider]  insulin lispro (HUMALOG) 100 UNIT/ML injection Inject 2-8 Units into the skin 4 (four) times daily -  with meals and at bedtime. Per sliding scale    [provider]  Lactobacillus (ACIDOPHILUS) CAPS capsule Take 1 capsule by mouth daily.    [provider]  latanoprost (XALATAN) 0.005 % ophthalmic solution Place 1 drop into both eyes at bedtime.  11/08/15   [provider]  LEVEMIR 100 UNIT/ML injection Inject 8-10 Units  into the skin See admin instructions. Inject 8 units into the skin in the morning and 10 units at night 03/05/15   [provider]  levothyroxine (SYNTHROID) 125 MCG tablet Take 250 mcg by mouth daily before breakfast.    [provider]  losartan (COZAAR) 25 MG tablet Take 25 mg by mouth daily. 08/28/20   [provider]  lovastatin (MEVACOR) 40 MG tablet Take 40 mg by mouth at bedtime.  01/18/14   [provider]  Multiple Vitamins-Minerals (PRESERVISION AREDS 2) CAPS Take 1 capsule by mouth  2 (two) times daily.    [provider]  Omega-3 Fatty Acids (FISH OIL) 1200 MG CAPS Take 1 capsule by mouth daily. Patient not taking: Reported on 03/20/2022    [provider]  omeprazole (PRILOSEC) 20 MG capsule Take 1 capsule (20 mg total) by mouth daily. Patient taking differently: Take 20 mg by mouth at bedtime as needed (acid reflux). 01/23/22   Armbruster, Carlota Raspberry, MD  Pancrelipase, Lip-Prot-Amyl, 6000-19000 units CPEP Take 4 capsules by mouth 3 (three) times daily.    [provider]  sildenafil (VIAGRA) 100 MG tablet Take 100 mg by mouth daily as needed for erectile dysfunction. 06/02/21   [provider]  torsemide (DEMADEX) 20 MG tablet Take 40 mg by mouth daily. 10/02/20   [provider]  traMADol (ULTRAM) 50 MG tablet Take 50 mg by mouth 2 (two) times daily. 03/30/20   [provider]      Allergies    Carvedilol, Iodine, and Prozac [fluoxetine hcl]    Review of Systems   Review of Systems  Gastrointestinal:  Positive for abdominal pain.    Physical Exam Updated Vital Signs BP (!) 187/77   Pulse (!) 49   Temp 98.2 F (36.8 C)   Resp 17   SpO2 98%  Physical Exam Vitals and nursing note reviewed.  Constitutional:      Appearance: He is well-developed.  HENT:     Head: Normocephalic and atraumatic.  Eyes:     Pupils: Pupils are equal, round, and reactive to light.  Neck:     Vascular: No JVD.  Cardiovascular:     Rate and Rhythm: Normal rate and regular rhythm.     Heart sounds: No murmur heard.    No friction rub. No gallop.  Pulmonary:     Effort: No respiratory distress.     Breath sounds: No wheezing.  Abdominal:     General: There is no distension.     Tenderness: There is abdominal tenderness. There is no guarding or rebound.     Comments: Diffuse tenderness on palpation worse in the right lower quadrant.  Musculoskeletal:        General: Normal range of motion.     Cervical back: Normal range of  motion and neck supple.  Skin:    Coloration: Skin is not pale.     Findings: No rash.  Neurological:     Mental Status: He is alert and oriented to person, place, and time.  Psychiatric:        Behavior: Behavior normal.     ED Results / Procedures / Treatments   Labs (all labs ordered are listed, but only abnormal results are displayed) Labs Reviewed  LIPASE, BLOOD - Abnormal; Notable for the following components:      Result Value   Lipase <10 (*)    All other components within normal limits  COMPREHENSIVE METABOLIC PANEL - Abnormal; Notable for  the following components:   Glucose, Bld 200 (*)    BUN 53 (*)    Creatinine, Ser 3.28 (*)    Calcium 8.7 (*)    Alkaline Phosphatase 134 (*)    GFR, Estimated 19 (*)    All other components within normal limits  CBC - Abnormal; Notable for the following components:   RBC 4.07 (*)    MCV 104.7 (*)    MCH 34.2 (*)    All other components within normal limits  URINALYSIS, ROUTINE W REFLEX MICROSCOPIC - Abnormal; Notable for the following components:   Protein, ur 100 (*)    All other components within normal limits    EKG None  Radiology CT ABDOMEN PELVIS WO CONTRAST  Result Date: 07/31/2022 CLINICAL DATA:  Pain right lower quadrant EXAM: CT ABDOMEN AND PELVIS WITHOUT CONTRAST TECHNIQUE: Multidetector CT imaging of the abdomen and pelvis was performed following the standard protocol without IV contrast. RADIATION DOSE REDUCTION: This exam was performed according to the departmental dose-optimization program which includes automated exposure control, adjustment of the mA and/or kV according to patient size and/or use of iterative reconstruction technique. COMPARISON:  11/25/2021 FINDINGS: Lower chest: In image 10 of series 4, there is 8 mm subpleural nodule in the medial right lower lobe which measured 4 mm in the previous study. There is 5 mm nodule in left lower lobe. There is 6 mm nodule in left lower lobe. There is 6 mm nodule  in right lower lobe. These nodules appear more prominent in size. There is no pleural effusion. Hepatobiliary: Air is present in intrahepatic bile ducts, most likely residual change from previous Whipple's procedure. No new focal abnormalities are seen in liver. Pancreas: Pancreas is not visualized. There are surgical clips in pancreatic bed and in left subdiaphragmatic location. Spleen: Spleen is not seen. Adrenals/Urinary Tract: Adrenals are unremarkable. There is previous right nephrectomy. Left kidney shows no hydronephrosis. There are few left renal stones measuring up to 6 mm in size. There are subcentimeter low-density foci in left kidney which are too small to optimally characterize. There is no demonstrable calculus in the course of the left ureter. There is mild diffuse wall thickening in the bladder. Stomach/Bowel: Surgical staples are seen in the stomach. There is previous gastro jejunal anastomosis. There is no significant dilation of small-bowel loops. Appendix is not dilated. There is no pericecal inflammation. There is no significant wall thickening in colon. There is no pericolic stranding. Vascular/Lymphatic: Scattered arterial calcifications are seen. No new significant lymphadenopathy is seen. Reproductive: Prostate is enlarged. Other: There is no ascites or pneumoperitoneum. Small paraumbilical hernia containing fat is seen. Small right inguinal hernia containing fat is seen. Musculoskeletal: Degenerative changes are noted in lumbar spine, more so at L5-S1 level. No acute findings are seen. IMPRESSION: There is no evidence of intestinal obstruction or pneumoperitoneum. There is no hydronephrosis. Appendix is not dilated. There is no pericecal inflammatory change. There are few noncalcified nodules in both lower lung fields with interval increase in size suggesting possible pulmonary metastatic disease. 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 Left renal stones.   Enlarged prostate.  Aortic atherosclerosis. Other findings as described in the body of the report. Electronically Signed   By: Elmer Picker M.D.   On: 07/31/2022 19:13    Procedures Procedures    Medications Ordered in ED Medications  morphine (PF) 4 MG/ML injection 4 mg (4 mg Intravenous Given 07/31/22 1854)  ondansetron (ZOFRAN) injection 4 mg (4 mg  Intravenous Given 07/31/22 1853)  sodium chloride 0.9 % bolus 1,000 mL (1,000 mLs Intravenous New Bag/Given 07/31/22 1853)    ED Course/ Medical Decision Making/ A&P                           Medical Decision Making Amount and/or Complexity of Data Reviewed Labs: ordered. Radiology: ordered.  Risk Prescription drug management.   71 yo M with a chief complaint of right-sided abdominal pain.  This been going on for about 24 hours.  Has diffuse pain but worse in the right lower quadrant.  CT imaging here without obvious appendicitis.  No leukocytosis, no significant electrolyte abnormality.  Mild worsening of his renal function.  LFTs and lipase unremarkable.  I reassessed the patient and he is feeling better.  He would like to go home.  Discharged home. 8:19 PM:  I have discussed the diagnosis/risks/treatment options with the patient.  Evaluation and diagnostic testing in the emergency department does not suggest an emergent condition requiring admission or immediate intervention beyond what has been performed at this time.  They will follow up with PCP. We also discussed returning to the ED immediately if new or worsening sx occur. We discussed the sx which are most concerning (e.g., sudden worsening pain, fever, inability to tolerate by mouth) that necessitate immediate return. Medications administered to the patient during their visit and any new prescriptions provided to the patient are listed below.  Medications given during this visit Medications  morphine (PF) 4 MG/ML injection 4 mg (4 mg Intravenous Given 07/31/22 1854)   ondansetron (ZOFRAN) injection 4 mg (4 mg Intravenous Given 07/31/22 1853)  sodium chloride 0.9 % bolus 1,000 mL (1,000 mLs Intravenous New Bag/Given 07/31/22 1853)     The patient appears reasonably screen and/or stabilized for discharge and I doubt any other medical condition or other [facility] requiring further screening, evaluation, or treatment in the ED at this time prior to discharge.           Final Clinical Impression(s) / ED Diagnoses Final diagnoses:  Right lower quadrant abdominal pain    Rx / DC Orders ED Discharge Orders     None         Deno Etienne, DO 07/31/22 2019

## 2022-08-05 ENCOUNTER — Encounter: Payer: Self-pay | Admitting: Gastroenterology

## 2022-08-12 ENCOUNTER — Other Ambulatory Visit: Payer: Self-pay

## 2022-08-12 DIAGNOSIS — C649 Malignant neoplasm of unspecified kidney, except renal pelvis: Secondary | ICD-10-CM

## 2022-08-13 ENCOUNTER — Other Ambulatory Visit: Payer: Self-pay

## 2022-08-13 ENCOUNTER — Inpatient Hospital Stay: Payer: Medicare Other | Attending: Hematology

## 2022-08-13 DIAGNOSIS — N189 Chronic kidney disease, unspecified: Secondary | ICD-10-CM | POA: Insufficient documentation

## 2022-08-13 DIAGNOSIS — C649 Malignant neoplasm of unspecified kidney, except renal pelvis: Secondary | ICD-10-CM | POA: Insufficient documentation

## 2022-08-13 DIAGNOSIS — E1122 Type 2 diabetes mellitus with diabetic chronic kidney disease: Secondary | ICD-10-CM | POA: Diagnosis not present

## 2022-08-13 DIAGNOSIS — C7800 Secondary malignant neoplasm of unspecified lung: Secondary | ICD-10-CM | POA: Insufficient documentation

## 2022-08-13 DIAGNOSIS — Z87891 Personal history of nicotine dependence: Secondary | ICD-10-CM | POA: Diagnosis not present

## 2022-08-13 DIAGNOSIS — M810 Age-related osteoporosis without current pathological fracture: Secondary | ICD-10-CM | POA: Diagnosis not present

## 2022-08-13 DIAGNOSIS — I129 Hypertensive chronic kidney disease with stage 1 through stage 4 chronic kidney disease, or unspecified chronic kidney disease: Secondary | ICD-10-CM | POA: Diagnosis not present

## 2022-08-13 LAB — CMP (CANCER CENTER ONLY)
ALT: 41 U/L (ref 0–44)
AST: 36 U/L (ref 15–41)
Albumin: 3.7 g/dL (ref 3.5–5.0)
Alkaline Phosphatase: 138 U/L — ABNORMAL HIGH (ref 38–126)
Anion gap: 11 (ref 5–15)
BUN: 66 mg/dL — ABNORMAL HIGH (ref 8–23)
CO2: 21 mmol/L — ABNORMAL LOW (ref 22–32)
Calcium: 8.5 mg/dL — ABNORMAL LOW (ref 8.9–10.3)
Chloride: 108 mmol/L (ref 98–111)
Creatinine: 3.03 mg/dL (ref 0.61–1.24)
GFR, Estimated: 21 mL/min — ABNORMAL LOW (ref >60)
Glucose, Bld: 173 mg/dL — ABNORMAL HIGH (ref 70–99)
Potassium: 3 mmol/L — ABNORMAL LOW (ref 3.5–5.1)
Sodium: 140 mmol/L (ref 135–145)
Total Bilirubin: 0.7 mg/dL (ref 0.3–1.2)
Total Protein: 6.1 g/dL — ABNORMAL LOW (ref 6.5–8.1)

## 2022-08-13 LAB — CBC WITH DIFFERENTIAL (CANCER CENTER ONLY)
Abs Immature Granulocytes: 0.02 10*3/uL (ref 0.00–0.07)
Basophils Absolute: 0.1 10*3/uL (ref 0.0–0.1)
Basophils Relative: 1 %
Eosinophils Absolute: 0.4 10*3/uL (ref 0.0–0.5)
Eosinophils Relative: 5 %
HCT: 40.4 % (ref 39.0–52.0)
Hemoglobin: 13.7 g/dL (ref 13.0–17.0)
Immature Granulocytes: 0 %
Lymphocytes Relative: 26 %
Lymphs Abs: 1.9 10*3/uL (ref 0.7–4.0)
MCH: 34.1 pg — ABNORMAL HIGH (ref 26.0–34.0)
MCHC: 33.9 g/dL (ref 30.0–36.0)
MCV: 100.5 fL — ABNORMAL HIGH (ref 80.0–100.0)
Monocytes Absolute: 1.2 10*3/uL — ABNORMAL HIGH (ref 0.1–1.0)
Monocytes Relative: 15 %
Neutro Abs: 4 10*3/uL (ref 1.7–7.7)
Neutrophils Relative %: 53 %
Platelet Count: 224 10*3/uL (ref 150–400)
RBC: 4.02 MIL/uL — ABNORMAL LOW (ref 4.22–5.81)
RDW: 12.7 % (ref 11.5–15.5)
WBC Count: 7.5 10*3/uL (ref 4.0–10.5)
nRBC: 0 % (ref 0.0–0.2)

## 2022-08-13 LAB — IRON AND IRON BINDING CAPACITY (CC-WL,HP ONLY)
Iron: 209 ug/dL — ABNORMAL HIGH (ref 45–182)
Saturation Ratios: 86 % — ABNORMAL HIGH (ref 17.9–39.5)
TIBC: 242 ug/dL — ABNORMAL LOW (ref 250–450)
UIBC: 33 ug/dL — ABNORMAL LOW (ref 117–376)

## 2022-08-13 LAB — FERRITIN: Ferritin: 1048 ng/mL — ABNORMAL HIGH (ref 24–336)

## 2022-08-13 NOTE — Progress Notes (Signed)
CRITICAL VALUE STICKER  CRITICAL VALUE: Creatinine 3.03  RECEIVER (on-site recipient of call):Dylan Jefferson, Guin NOTIFIED: 08/13/22 AT [phone]  MESSENGER (representative from lab): Lawana Pai  MD NOTIFIED: Dr. Irene Limbo  TIME OF NOTIFICATION: [phone] 08/13/22  RESPONSE: Will review

## 2022-08-14 ENCOUNTER — Ambulatory Visit (HOSPITAL_COMMUNITY)
Admission: RE | Admit: 2022-08-14 | Discharge: 2022-08-14 | Disposition: A | Discharge status: Home / Self Care | Payer: Medicare Other | Source: Ambulatory Visit | Attending: Hematology | Admitting: Hematology

## 2022-08-14 DIAGNOSIS — K429 Umbilical hernia without obstruction or gangrene: Secondary | ICD-10-CM | POA: Insufficient documentation

## 2022-08-14 DIAGNOSIS — Z9081 Acquired absence of spleen: Secondary | ICD-10-CM | POA: Diagnosis not present

## 2022-08-14 DIAGNOSIS — Z905 Acquired absence of kidney: Secondary | ICD-10-CM | POA: Diagnosis not present

## 2022-08-14 DIAGNOSIS — C649 Malignant neoplasm of unspecified kidney, except renal pelvis: Secondary | ICD-10-CM | POA: Diagnosis present

## 2022-08-14 DIAGNOSIS — I2510 Atherosclerotic heart disease of native coronary artery without angina pectoris: Secondary | ICD-10-CM | POA: Diagnosis not present

## 2022-08-14 DIAGNOSIS — R918 Other nonspecific abnormal finding of lung field: Secondary | ICD-10-CM | POA: Insufficient documentation

## 2022-08-20 ENCOUNTER — Encounter: Payer: Self-pay | Admitting: Hematology

## 2022-08-20 ENCOUNTER — Other Ambulatory Visit: Payer: Self-pay

## 2022-08-20 ENCOUNTER — Inpatient Hospital Stay: Payer: Medicare Other

## 2022-08-20 ENCOUNTER — Inpatient Hospital Stay: Payer: Medicare Other | Admitting: Hematology

## 2022-08-20 VITALS — BP 181/82 | HR 40 | Temp 97.3°F | Resp 18 | Wt 185.4 lb

## 2022-08-20 DIAGNOSIS — C649 Malignant neoplasm of unspecified kidney, except renal pelvis: Secondary | ICD-10-CM

## 2022-08-20 DIAGNOSIS — M7989 Other specified soft tissue disorders: Secondary | ICD-10-CM

## 2022-08-20 NOTE — Progress Notes (Signed)
Patient seen by MD today  Vitals are not all within treatment parameters. BP was 181/82 Dr Irene Limbo aware  Labs reviewed: ,"and are not all within treatment parameters. Dr Irene Limbo aware: K: 3.0, Cr 3.03  when labs done on 08/13/22  Per physician team, patient is ready for treatment and there are NO modifications to the treatment plan.

## 2022-08-20 NOTE — Progress Notes (Signed)
Dylan Jefferson presents today for phlebotomy per MD orders. Phlebotomy procedure started at 1020 and ended at 1040. 400 grams removed. Pt clotted after 400g, declined additional stick to remove remaining 100g. Food and drink offered. Patient observed for 30 minutes after procedure without any incident. Patient tolerated procedure well. IV needle removed intact.

## 2022-08-20 NOTE — Progress Notes (Signed)
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 2) Remote history of metastatic renal cell carcinoma Diagnosed with renal cell carcinoma 20 years ago and had a right nephrectomy. About 8 years after that he was noted to have abdominal recurrence in his pancreas spleen and small intestine and had a Whipple's procedure and significant abdominal surgery and notes that 10 out of 17 lymph nodes were positive. He also had his gallbladder removed. Postoperative course was complicated by an internal hemorrhage as per his report. Patient notes 2-3 years after that he had recurrence in his thyroid that led to a thyroidectomy. [June 2004] later he had partial gastrectomy for local recurrence. [February 2005] when he presented with GI bleeding. Patient notes that he has had no known evidence of recurrence over the last 8 years until his recent CT scan showed lung nodules.  Current Treatment: Sutent 37.5 mg po daily for 2 weeks on and 1 weeks off. (on hold now from 10/2020) Previous treatment Sutent on cycle 1 - 50 mg by mouth daily for 4 weeks on and 2-weeks off. It was dose adjusted to 37.5 mg by mouth daily for 2 weeks with 1 week of to help mitigate issues with fatigue, mild hyperbilirubinemia, cytopenias. SBRT to dominant RML nodule.   INTERVAL HISTORY:  Dylan Jefferson is a 72 y.o. male here for follow-up for his metastatic renal cell carcinoma..  Patient was last seen by me on 04/11/2022 and he complained of hematuria since his kidney stones surgery, which he was following up with his Urologist, and complained of joint  pain due to rheumatoid arthritis.   Patient is accompanied by his wife during this visit. He reports he has been doing fairly well overall since our last visit. He notes that on 07/31/2022, he had right groin pain that led him to the ED and he is unsure if there was any swelling. He described the pain as constant aching pain. At the ED, he was given morphine '4mg'$ /ml injection and ondansetron 4 mg injection for his pain. He notes that he had an CT scan which did not show appendicitis.   He notes that he is feeling better since his ED visit, but still complains of right leg pain and bilateral leg swelling. He notes that right leg is more swollen than his left leg. Patient complains of bilateral hip pain when laying in bed.  Patient reports that his rheumatoid arthritis has not improved since our last visit. He notes that he is currently not getting treated for rheumatoid arthritis due to complications with his medical history.   He denies fever, chills, night sweats, shortness of breath, back pain, chest pain, abdominal pain.   Patient notes that his blood sugar levels are well controlled since our last visit.    Past Medical History:  Diagnosis Date   Arthritis    Bradycardia    Cancer ([place])    Renal cell   DDD (degenerative disc disease), cervical 05/28/2016   DDD (degenerative disc disease), lumbar 05/28/2016   Depression    Diabetes ([place])    Hyperlipidemia 05/28/2016   Hypertension    Hypothyroidism    Inflammatory polyarthritis ([place]) 05/28/2016   Sero  Negative, Ultrasound positive synovitis    Kidney disease    Macular degeneration    Bilateral eyes   Nephrolithiasis    Osteoarthritis of both hands 05/28/2016   Osteoarthritis of both knees 05/28/2016   Peripheral vascular disorder ([place]) 05/28/2016   Peyronie's syndrome    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   Rheumatoid arthritis ([place])    Thyroid cancer ([place]) 05/28/2016    . Past Surgical History:  Procedure Laterality Date    CATARACT EXTRACTION Bilateral 2013   CHOLECYSTECTOMY     CYSTOSCOPY/URETEROSCOPY/HOLMIUM LASER/STENT PLACEMENT Left 03/31/2022   Procedure: CYSTOSCOPY/ RETROGRADE/URETEROSCOPY/HOLMIUM LASER/STENT PLACEMENT;  Surgeon: Raynelle Bring, MD;  Location: [facility];  Service: Urology;  Laterality: Left;   GASTRECTOMY     tumor removed   GLAUCOMA SURGERY     Laser surgery   IR US GUIDE VASC ACCESS RIGHT  08/22/2021   IR VENOGRAM HEPATIC W HEMODYNAMIC EVALUATION  08/22/2021   LIPOMA EXCISION Right 1999   NEPHRECTOMY Right    PANCREATECTOMY     SPLENECTOMY     THYROIDECTOMY     URETERAL STENT PLACEMENT Left 2012   WHIPPLE PROCEDURE      . Social History   Tobacco Use   Smoking status: Former    Years: 1.50    Types: Cigarettes   Smokeless tobacco: Never  Vaping Use   Vaping Use: Never used  Substance Use Topics   Alcohol use: Yes    Comment: occasional beer   Drug use: No    ALLERGIES:  is allergic to carvedilol, iodine, and prozac [fluoxetine hcl].  MEDICATIONS:  Current Outpatient Medications  Medication Sig Dispense Refill   aspirin 81 MG tablet Take 81 mg by mouth daily.  (Patient not taking: Reported on 03/20/2022)     atenolol (TENORMIN) 50 MG tablet Take 50 mg by mouth daily.     augmented betamethasone dipropionate (DIPROLENE-AF) 0.05 % cream Apply 1 application topically daily as needed (rash).   0   azelastine (OPTIVAR) 0.05 % ophthalmic solution Place 1 drop into both eyes in the morning and at bedtime.     calcium citrate-vitamin D (CITRACAL+D) 315-200 MG-UNIT per tablet Take 1 tablet by mouth 2 (two) times daily.     Cholecalciferol (VITAMIN D3) 50 MCG (2000 UT) TABS Take 2,000 Units by mouth daily. 2 Tablets daily     Cyanocobalamin (VITAMIN B-12) 2500 MCG SUBL Take 2,500 mcg by mouth daily.  (Patient not taking: Reported on 03/20/2022)     diclofenac Sodium (VOLTAREN) 1 % GEL Apply 3 g topically 3 (three) times daily as needed (joint pain).      dicyclomine (BENTYL) 10 MG  capsule Take 1 capsule (10 mg total) by mouth every 8 (eight) hours as needed for spasms. 30 capsule 1   docusate sodium (COLACE) 100 MG capsule Take 100 mg by mouth 2 (two) times daily as needed for mild constipation.      dorzolamide-timolol (COSOPT) 22.3-6.8 MG/ML ophthalmic solution Place 1 drop into both eyes 2 (two) times daily.   1   famotidine (PEPCID) 20 MG tablet Take 20 mg by mouth 3 (three) times daily.     gabapentin (NEURONTIN) 300 MG capsule Take 300 mg by mouth daily as needed (pain).     Glucosamine-MSM-Hyaluronic Acd 750-375-30 MG TABS Take 2 tablets by mouth daily.     hydrALAZINE (APRESOLINE) 50 MG tablet Take 50 mg by mouth 3 (three) times daily.     HYDROcodone-acetaminophen (NORCO/VICODIN) 5-325 MG tablet Take 1 tablet by mouth  every 6 (six) hours as needed for severe pain.     insulin lispro (HUMALOG) 100 UNIT/ML injection Inject 2-8 Units into the skin 4 (four) times daily -  with meals and at bedtime. Per sliding scale     Lactobacillus (ACIDOPHILUS) CAPS capsule Take 1 capsule by mouth daily.     latanoprost (XALATAN) 0.005 % ophthalmic solution Place 1 drop into both eyes at bedtime.   1   LEVEMIR 100 UNIT/ML injection Inject 8-10 Units into the skin See admin instructions. Inject 8 units into the skin in the morning and 10 units at night  1   levothyroxine (SYNTHROID) 125 MCG tablet Take 250 mcg by mouth daily before breakfast.     losartan (COZAAR) 25 MG tablet Take 25 mg by mouth daily.     lovastatin (MEVACOR) 40 MG tablet Take 40 mg by mouth at bedtime.      Multiple Vitamins-Minerals (PRESERVISION AREDS 2) CAPS Take 1 capsule by mouth 2 (two) times daily.     Omega-3 Fatty Acids (FISH OIL) 1200 MG CAPS Take 1 capsule by mouth daily. (Patient not taking: Reported on 03/20/2022)     omeprazole (PRILOSEC) 20 MG capsule Take 1 capsule (20 mg total) by mouth daily. (Patient taking differently: Take 20 mg by mouth at bedtime as needed (acid reflux).) 30 capsule 3    Pancrelipase, Lip-Prot-Amyl, 6000-19000 units CPEP Take 4 capsules by mouth 3 (three) times daily.     sildenafil (VIAGRA) 100 MG tablet Take 100 mg by mouth daily as needed for erectile dysfunction.     torsemide (DEMADEX) 20 MG tablet Take 40 mg by mouth daily.     traMADol (ULTRAM) 50 MG tablet Take 50 mg by mouth 2 (two) times daily.     No current facility-administered medications for this visit.    REVIEW OF SYSTEMS:   10 Point review of Systems was done is negative except as noted above.  PHYSICAL EXAMINATION: ECOG FS:1 - Symptomatic but completely ambulatory  Vitals:   08/20/22 0900  BP: (!) 181/82  Pulse: (!) 40  Resp: 18  Temp: (!) 97.3 F (36.3 C)  SpO2: 99%     Wt Readings from Last 3 Encounters:  04/11/22 178 lb 6.4 oz (80.9 kg)  03/31/22 177 lb 0.5 oz (80.3 kg)  03/21/22 177 lb (80.3 kg)   Body mass index is 26.6 kg/m.      NAD GENERAL:alert, in no acute distress and comfortable SKIN: no acute rashes, no significant lesions EYES: conjunctiva are pink and non-injected, sclera anicteric OROPHARYNX: MMM, no exudates, no oropharyngeal erythema or ulceration NECK: supple, no JVD LYMPH:  no palpable lymphadenopathy in the cervical, axillary or inguinal regions LUNGS: clear to auscultation b/l with normal respiratory effort HEART: regular rate & rhythm ABDOMEN:  normoactive bowel sounds , non tender, not distended. Extremity: b/l leg swelling pedal edema PSYCH: alert & oriented x 3 with fluent speech NEURO: no focal motor/sensory deficits   LABORATORY DATA:   I have reviewed the data as listed  .    Latest Ref Rng & Units 08/13/2022    7:38 AM 07/31/2022    3:56 PM 04/11/2022   10:31 AM  CBC  WBC 4.0 - 10.5 K/uL 7.5  8.0  9.4   Hemoglobin 13.0 - 17.0 g/dL 13.7  13.9  12.7   Hematocrit 39.0 - 52.0 % 40.4  42.6  38.2   Platelets 150 - 400 K/uL 224  221  245    . CBC  Component Value Date/Time   WBC 7.5 08/13/2022 0738   WBC 8.0 07/31/2022 1556    RBC 4.02 (L) 08/13/2022 0738   HGB 13.7 08/13/2022 0738   HGB 14.0 06/11/2017 0808   HCT 40.4 08/13/2022 0738   HCT 40.7 06/11/2017 0808   PLT 224 08/13/2022 0738   PLT 223 06/11/2017 0808   MCV 100.5 (H) 08/13/2022 0738   MCV 112.1 (H) 06/11/2017 0808   MCH 34.1 (H) 08/13/2022 0738   MCHC 33.9 08/13/2022 0738   RDW 12.7 08/13/2022 0738   RDW 13.9 06/11/2017 0808   LYMPHSABS 1.9 08/13/2022 0738   LYMPHSABS 1.6 06/11/2017 0808   MONOABS 1.2 (H) 08/13/2022 0738   MONOABS 0.5 06/11/2017 0808   EOSABS 0.4 08/13/2022 0738   EOSABS 0.1 06/11/2017 0808   BASOSABS 0.1 08/13/2022 0738   BASOSABS 0.0 06/11/2017 0808   .    Latest Ref Rng & Units 08/13/2022    7:38 AM 07/31/2022    3:56 PM 04/11/2022   10:31 AM  CMP  Glucose 70 - 99 mg/dL 173  200  163   BUN 8 - 23 mg/dL 66  53  46   Creatinine 0.61 - 1.24 mg/dL 3.03  3.28  2.85   Sodium 135 - 145 mmol/L 140  143  142   Potassium 3.5 - 5.1 mmol/L 3.0  3.6  3.3   Chloride 98 - 111 mmol/L 108  105  110   CO2 22 - 32 mmol/L '21  23  26   '$ Calcium 8.9 - 10.3 mg/dL 8.5  8.7  8.6   Total Protein 6.5 - 8.1 g/dL 6.1  6.6  6.0   Total Bilirubin 0.3 - 1.2 mg/dL 0.7  0.8  0.8   Alkaline Phos 38 - 126 U/L 138  134  138   AST 15 - 41 U/L 36  26  27   ALT 0 - 44 U/L 41  30  28    08/18/2019 CT Abdomen Pelvis Wo Contrast (Accession [id number])   08/18/2019 CT Chest Wo Contrast (Accession [id number])   RADIOGRAPHIC STUDIES: I have personally reviewed the radiological images as listed and agreed with the findings in the report. CT CHEST ABDOMEN PELVIS WO CONTRAST  Result Date: 08/14/2022 CLINICAL DATA:  Metastatic renal cell carcinoma. * Tracking Code: BO * EXAM: CT CHEST, ABDOMEN AND PELVIS WITHOUT CONTRAST TECHNIQUE: Multidetector CT imaging of the chest, abdomen and pelvis was performed following the standard protocol without IV contrast. RADIATION DOSE REDUCTION: This exam was performed according to the departmental dose-optimization  program which includes automated exposure control, adjustment of the mA and/or kV according to patient size and/or use of iterative reconstruction technique. COMPARISON:  CT abdomen and pelvis 07/23/2022. Chest abdomen pelvis 11/25/2021. Older exams as well. FINDINGS: CT CHEST FINDINGS Cardiovascular: Coronary artery calcifications are seen. The heart is nonenlarged. Normal caliber thoracic aorta. Minimal vascular calcifications on this noncontrast exam. Mediastinum/Nodes: Surgical changes along the thyroid bed. Grossly normal caliber thoracic esophagus which is slightly patulous. On this non IV contrast exam there is no specific abnormal lymph node enlargement identified in the axillary region, hilum or mediastinum. Lungs/Pleura: There is some scarring or atelectatic changes identified along the anterior margin of the right lower lobe as well as along the minor fissure. No pleural effusion, pneumothorax or consolidation there are several smaller nodules identified in both specific lesions will be followed for continuity from 07/31/22 older exams. Lesion in the medial aspect of the right lower lobe on series  4, image 142 measures 9 x 6 mm. On the study of April 2023 this would have measured 6 by 4 mm. Punctate nodule right lower lobe lateral on series 4, image 123 today measures 6 by 6 mm previously only 3 mm. 4 mm nodule right lower lobe on series 4, image 5 is also increased from previous. Less well seen on the prior due to a crossing vessel. 3 mm left upper lobe nodule on series 4, image 88 measured 2 mm on the prior April 2023. Additional left lower lobe nodules are identified. Two foci are seen. Example series 4, image 128 measures 6 by 5 mm and previously 5 x 4 mm when measured in the same fashion. Adjacent more superior focus on image 2 is also larger. The number of nodules is similar to study 2023 April Musculoskeletal: Slight curvature of the spine with some degenerative change. There is a destructive  lesion involving the lateral aspect of the right fourth rib, unchanged from previous. This may be a pathologic fracture with a metastasis. If needed a whole body bone scan could be performed as clinically indicated. CT ABDOMEN PELVIS FINDINGS Hepatobiliary: On this non IV contrast exam, grossly the liver is without space-occupying lesion. There is some air in the biliary tree, unchanged from previous. Right hepatic lobe granuloma. Pancreas: Pancreas is undergone previous surgical resection with Roux-en-Y anastomosis, distal gastrectomy, splenectomy. Spleen: Surgically absent. Adrenals/Urinary Tract: Adrenal glands are preserved. No abnormal enlargement of the left kidney. Tiny cystic foci are again seen. There is a nonobstructing stone towards the lower pole left kidney. No left ureteral stone. Evaluation for solid organ pathology is limited without the advantage of IV contrast. Surgical changes from right nephrectomy. No abnormal soft tissue in the surgical bed. Slight wall thickening of the urinary bladder diffusely. Preserved contour otherwise. Stomach/Bowel: Surgical changes from distal gastrectomy with Roux-en-Y anastomosis. On this non oral contrast exam, large bowel has a normal course and caliber with scattered colonic stool. Normal appendix extends medial to the cecum in the right lower quadrant. Vascular/Lymphatic: Normal caliber aorta and IVC with scattered atherosclerotic calcifications. No specific abnormal lymph node enlargement identified in the abdomen or pelvis. Reproductive: Mildly enlarged prostate. Please correlate with patient's PSA in a changes of BPH. Other: Small fat containing umbilical hernia. Mild anasarca. No ascites or free air Musculoskeletal: Scattered degenerative changes seen of the spine and pelvis. Osteopenia. If there is concern of osseous metastatic disease, bone scan can be performed as clinically indicated. IMPRESSION: Extensive surgical changes in the abdomen from right  nephrectomy, splenectomy and presumed Whipple procedure. No abnormal soft tissue or lymph node enlargement seen in the abdomen and pelvis including towards the surgical bed. Multiple small lung nodules. These are similar in number but several have slightly increased in size. Stable fracture involving the lateral aspect of the right fourth rib with some irregularity. Please correlate for a metastatic lesion. Electronically Signed   By: Jill Side M.D.   On: 08/14/2022 15:54   CT ABDOMEN PELVIS WO CONTRAST  Result Date: 07/31/2022 CLINICAL DATA:  Pain right lower quadrant EXAM: CT ABDOMEN AND PELVIS WITHOUT CONTRAST TECHNIQUE: Multidetector CT imaging of the abdomen and pelvis was performed following the standard protocol without IV contrast. RADIATION DOSE REDUCTION: This exam was performed according to the departmental dose-optimization program which includes automated exposure control, adjustment of the mA and/or kV according to patient size and/or use of iterative reconstruction technique. COMPARISON:  11/25/2021 FINDINGS: Lower chest: In image 10 of series  4, there is 8 mm subpleural nodule in the medial right lower lobe which measured 4 mm in the previous study. There is 5 mm nodule in left lower lobe. There is 6 mm nodule in left lower lobe. There is 6 mm nodule in right lower lobe. These nodules appear more prominent in size. There is no pleural effusion. Hepatobiliary: Air is present in intrahepatic bile ducts, most likely residual change from previous Whipple's procedure. No new focal abnormalities are seen in liver. Pancreas: Pancreas is not visualized. There are surgical clips in pancreatic bed and in left subdiaphragmatic location. Spleen: Spleen is not seen. Adrenals/Urinary Tract: Adrenals are unremarkable. There is previous right nephrectomy. Left kidney shows no hydronephrosis. There are few left renal stones measuring up to 6 mm in size. There are subcentimeter low-density foci in left kidney  which are too small to optimally characterize. There is no demonstrable calculus in the course of the left ureter. There is mild diffuse wall thickening in the bladder. Stomach/Bowel: Surgical staples are seen in the stomach. There is previous gastro jejunal anastomosis. There is no significant dilation of small-bowel loops. Appendix is not dilated. There is no pericecal inflammation. There is no significant wall thickening in colon. There is no pericolic stranding. Vascular/Lymphatic: Scattered arterial calcifications are seen. No new significant lymphadenopathy is seen. Reproductive: Prostate is enlarged. Other: There is no ascites or pneumoperitoneum. Small paraumbilical hernia containing fat is seen. Small right inguinal hernia containing fat is seen. Musculoskeletal: Degenerative changes are noted in lumbar spine, more so at L5-S1 level. No acute findings are seen. IMPRESSION: There is no evidence of intestinal obstruction or pneumoperitoneum. There is no hydronephrosis. Appendix is not dilated. There is no pericecal inflammatory change. There are few noncalcified nodules in both lower lung fields with interval increase in size suggesting possible pulmonary metastatic disease. 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 Left renal stones.  Enlarged prostate.  Aortic atherosclerosis. Other findings as described in the body of the report. Electronically Signed   By: Elmer Picker M.D.   On: 07/31/2022 19:13      ASSESSMENT & PLAN:   72 y.o. Caucasian male with  #1 Recurrent metastatic Renal cell carcinoma with multiple lung metastases.  Has been on stable on 1st line Sutent for >34yr Has had SBRT to the dominant right lung nodule with partial response. No lab or clinical evidence of metastatic RCC progression at this time.  PET/CT 08/08/2016 with a couple of mildly enlarge LLL pulmonary nodules - no other overt evidence of RCC progression at this time.  PET CT  scan 11/06/2016 - no overt evidence of RCC progression at this time .  PET/CT 03/13/2017 - no overt evidence of RCC progression at this time .  PET/CT 08/10/2017 - no overt evidence of RCC progression at this time .  PET/ CT done 08/10/2017, shows no evidence of  Progression at this time.  12/07/17 PET which revealed No findings suspicious for recurrent or metastatic disease.  03/22/18 PET/CT revealed One of the left lower lobe nodules has very minimally enlarged over the last 2 years, currently 0.6 by 0.4 cm and measuring 0.4 by 0.3 cm on 03/20/2016. This probably represents a previously treated metastatic nodule given that it measured 1.0 by 0.8 cm on 06/06/2015. This nodule may be subject to some slice selection phenomenon and also motion artifact given proximity to the diaphragm. Surveillance is likely warranted. 2. Chronic central lucency, marginal bony deposition, and slow healing of the right  lateral fourth rib fracture. Possibilities may include underlying radiation necrosis as a cause for fracture leading to slow healing, or a low-grade pathologic lesion. Maximum SUV at this fracture site is 2.9, previously 2.4. 3. 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 There is only low-grade activity in this vicinity characteristic of radiation fibrosis, without focal activity. 4. Other imaging findings of potential clinical significance: Aortic Atherosclerosis. Coronary atherosclerosis. Mild cardiomegaly. Right nephrectomy. 8 mm nonobstructive calculus in the left kidney lower pole.   08/24/18 CT C/A/P revealed "Stable small left lower lobe pulmonary nodules from recent prior studies. As previously noted, these have decreased in size from PET-CT 06/06/2015, likely treated metastases. No new or enlarging pulmonary nodules. 2. Postsurgical changes in the upper abdomen without evidence of local recurrence or metastatic disease. 3.  Nonobstructing left renal calculus. 4. Stable radiation changes in the right upper lobe with probable chronic pathologic fracture of the right 4th rib laterally."  03/07/2019 CT C/A wo contrast revealed "1. Stable left lower lobe pulmonary nodules, likely treated Metastases. 2. No additional evidence of metastatic disease. 3. Left renal stone. 4. Post radiation scarring in the right hemithorax with a healed adjacent right rib fracture which is presumably pathologic in Etiology. 5.  Aortic atherosclerosis (ICD10-170.0). 6. 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 Estanislado Pandy for mx #6 DM2 -continue f/u with Dr Philip Aspen for continue mx, blood sugar at home per patient.  #7postsplenectomy status #8 abnormal liver function tests.  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  PLAN: -discussed lab results from 08/13/2022 with the patient. CBC is stable. CMP showed chronic kidney disease with creatinine of 3.03, decreased potassium of 3.0, and increased glucose of 173. -discussed the recent CT Chest/abd/pelvis scan from 08/14/2022 with the patient. Showed: Extensive surgical changes in the abdomen from right nephrectomy, splenectomy and presumed Whipple procedure. No abnormal soft tissue or lymph node enlargement seen in the abdomen and pelvis including towards the surgical bed. Multiple small lung nodules. These are similar in number but several have slightly increased in size. Stable fracture involving the lateral aspect of the right fourth rib with some irregularity. Please correlate for a metastatic lesion. -Plan of ultrasound of the right leg today due to right leg  swelling and right leg pain. -- US venous done today and was noted to he negative for DVT -Phlebotomy today and every 2 months. Goal of ferritin <250 if tolerated by hgb . Lab Results  Component Value Date   IRON 209 (H) 08/13/2022   TIBC 242 (L) 08/13/2022   IRONPCTSAT 86 (H) 08/13/2022   (Iron and TIBC)  Lab Results  Component Value Date   FERRITIN 1,048 (H) 08/13/2022    -Discussed the his osteoporosis can be treated with xgeva.  -Discussed that the next treatment option of immunotherapy, Keytruda, or just watch it for now and decide on immunotherapy later.  -educated the patient and his wife about the immunotherapy. -Educated the patient that immunotherapy can cause worsening rheumatoid arthritis.  -Patient would like to wait for around 3-4 months and then decide on the immunotherapy.  -Plan on PET scan in 3-4 months before our next visit,  if approved by insurance.   FOLLOW-UP: Please schedule labs and therapeutic phlebotomies every 8 weeks x 4 PET CT scan in 10 weeks Return to clinic with Dr. Irene Limbo in 12 weeks with labs Ultrasound right lower extremity venous Doppler today   The total time spent in the appointment was 32 minutes* .  All of the patient's questions were answered with apparent satisfaction. The patient knows to call the clinic with any problems, questions or concerns.   Sullivan Lone MD MS AAHIVMS [facility] Hematology/Oncology Physician [facility]  .*Total Encounter Time as defined by the Centers for Medicare and Medicaid Services includes, in addition to the face-to-face time of a patient visit (documented in the note above) non-face-to-face time: obtaining and reviewing outside history, ordering and reviewing medications, tests or procedures, care coordination (communications with other health care professionals or caregivers) and documentation in the medical record.   I, Cleda Mccreedy, acting as a Education administrator for Sullivan Lone, MD. .I have reviewed the  above documentation for accuracy and completeness, and I agree with the above. Brunetta Genera MD

## 2022-08-20 NOTE — Patient Instructions (Signed)

## 2022-08-21 ENCOUNTER — Ambulatory Visit (HOSPITAL_COMMUNITY)
Admission: RE | Admit: 2022-08-21 | Discharge: 2022-08-21 | Disposition: A | Discharge status: Home / Self Care | Payer: Medicare Other | Source: Ambulatory Visit | Attending: Hematology | Admitting: Hematology

## 2022-08-21 DIAGNOSIS — M7989 Other specified soft tissue disorders: Secondary | ICD-10-CM | POA: Diagnosis present

## 2022-08-21 DIAGNOSIS — C641 Malignant neoplasm of right kidney, except renal pelvis: Secondary | ICD-10-CM

## 2022-08-21 DIAGNOSIS — C649 Malignant neoplasm of unspecified kidney, except renal pelvis: Secondary | ICD-10-CM

## 2022-08-26 ENCOUNTER — Encounter: Payer: Self-pay | Admitting: Hematology

## 2022-10-03 ENCOUNTER — Inpatient Hospital Stay: Payer: Medicare Other | Attending: Hematology

## 2022-10-03 NOTE — Progress Notes (Signed)
[place] Work  Clinical Social Work was referred by nurse for assessment of psychosocial needs.  Clinical Social Worker contacted patient by phone  to offer support and assess for needs.    Patient would like to revise his advance directives.  He and his wife, Stanton Kidney, have made an appointment for Tuesday, March 5th, at 11am at the [facility].  CSW answered questions regarding the advance directives.     Margaree Mackintosh, LCSW  Clinical Social Worker [facility]

## 2022-10-07 ENCOUNTER — Inpatient Hospital Stay: Payer: Medicare Other

## 2022-10-07 NOTE — Progress Notes (Signed)
[place] Social Work  Patient presented to [facility]  to review and complete healthcare advance directives.  Clinical Social Worker met with patient and his wife, Dylan Jefferson.  The patient designated Dylan Jefferson as their primary healthcare agent and Dylan Jefferson as their secondary agent.  Patient also completed healthcare living will.    Documents were notarized and copies made for patient/family. Clinical Social Worker will send documents to medical records to be scanned into patient's chart. Clinical Social Worker encouraged patient/family to contact with any additional questions or concerns.   Margaree Mackintosh, LCSW Clinical Social Worker [facility]

## 2022-10-14 ENCOUNTER — Other Ambulatory Visit: Payer: Self-pay

## 2022-10-14 ENCOUNTER — Telehealth: Payer: Self-pay | Admitting: Hematology

## 2022-10-14 NOTE — Telephone Encounter (Signed)
Called patient per 3/12 secure chat to reschedule infusion appointments to later time. Patient rescheduled and notified.

## 2022-10-15 ENCOUNTER — Inpatient Hospital Stay: Payer: Medicare Other

## 2022-10-15 ENCOUNTER — Other Ambulatory Visit: Payer: Self-pay

## 2022-10-15 LAB — CMP (CANCER CENTER ONLY)
ALT: 39 U/L (ref 0–44)
AST: 36 U/L (ref 15–41)
Albumin: 3.5 g/dL (ref 3.5–5.0)
Alkaline Phosphatase: 136 U/L — ABNORMAL HIGH (ref 38–126)
Anion gap: 9 (ref 5–15)
BUN: 44 mg/dL — ABNORMAL HIGH (ref 8–23)
CO2: 24 mmol/L (ref 22–32)
Calcium: 8 mg/dL — ABNORMAL LOW (ref 8.9–10.3)
Chloride: 109 mmol/L (ref 98–111)
Creatinine: 2.72 mg/dL — ABNORMAL HIGH (ref 0.61–1.24)
GFR, Estimated: 24 mL/min — ABNORMAL LOW (ref >60)
Glucose, Bld: 136 mg/dL — ABNORMAL HIGH (ref 70–99)
Potassium: 3.6 mmol/L (ref 3.5–5.1)
Sodium: 142 mmol/L (ref 135–145)
Total Bilirubin: 1 mg/dL (ref 0.3–1.2)
Total Protein: 6 g/dL — ABNORMAL LOW (ref 6.5–8.1)

## 2022-10-15 LAB — CBC WITH DIFFERENTIAL (CANCER CENTER ONLY)
Abs Immature Granulocytes: 0.01 10*3/uL (ref 0.00–0.07)
Basophils Absolute: 0.1 10*3/uL (ref 0.0–0.1)
Basophils Relative: 1 %
Eosinophils Absolute: 0.2 10*3/uL (ref 0.0–0.5)
Eosinophils Relative: 3 %
HCT: 37.2 % — ABNORMAL LOW (ref 39.0–52.0)
Hemoglobin: 12.3 g/dL — ABNORMAL LOW (ref 13.0–17.0)
Immature Granulocytes: 0 %
Lymphocytes Relative: 29 %
Lymphs Abs: 1.8 10*3/uL (ref 0.7–4.0)
MCH: 34.1 pg — ABNORMAL HIGH (ref 26.0–34.0)
MCHC: 33.1 g/dL (ref 30.0–36.0)
MCV: 103 fL — ABNORMAL HIGH (ref 80.0–100.0)
Monocytes Absolute: 1.1 10*3/uL — ABNORMAL HIGH (ref 0.1–1.0)
Monocytes Relative: 18 %
Neutro Abs: 3.1 10*3/uL (ref 1.7–7.7)
Neutrophils Relative %: 49 %
Platelet Count: 209 10*3/uL (ref 150–400)
RBC: 3.61 MIL/uL — ABNORMAL LOW (ref 4.22–5.81)
RDW: 13.6 % (ref 11.5–15.5)
WBC Count: 6.3 10*3/uL (ref 4.0–10.5)
nRBC: 0 % (ref 0.0–0.2)

## 2022-10-15 LAB — IRON AND IRON BINDING CAPACITY (CC-WL,HP ONLY)
Iron: 151 ug/dL (ref 45–182)
Saturation Ratios: 60 % — ABNORMAL HIGH (ref 17.9–39.5)
TIBC: 251 ug/dL (ref 250–450)
UIBC: 100 ug/dL

## 2022-10-15 LAB — FERRITIN: Ferritin: 899 ng/mL — ABNORMAL HIGH (ref 24–336)

## 2022-10-15 NOTE — Progress Notes (Signed)
Per Dr Irene Limbo, proceed with therapeutic phlebotomy today.

## 2022-10-15 NOTE — Patient Instructions (Signed)

## 2022-10-15 NOTE — Progress Notes (Signed)
Dylan Jefferson presents today for phlebotomy per MD orders. Phlebotomy procedure started at 1530 and ended at 1542. A 18G PIV was used with a secondary kit for the procedure.  513 grams removed. Patient was provided beverage.  Patient observed for 30 minutes after procedure without any incident. Patient tolerated procedure well. IV needle removed intact. VSS at discharge, Patient ambulatory to lobby with no complaints.

## 2022-10-24 ENCOUNTER — Encounter (HOSPITAL_COMMUNITY)
Admission: RE | Admit: 2022-10-24 | Discharge: 2022-10-24 | Disposition: A | Discharge status: Home / Self Care | Payer: Medicare Other | Source: Ambulatory Visit | Attending: Hematology | Admitting: Hematology

## 2022-10-24 DIAGNOSIS — C649 Malignant neoplasm of unspecified kidney, except renal pelvis: Secondary | ICD-10-CM | POA: Diagnosis present

## 2022-10-24 LAB — GLUCOSE, CAPILLARY: Glucose-Capillary: 155 mg/dL — ABNORMAL HIGH (ref 70–99)

## 2022-10-24 MED ORDER — FLUDEOXYGLUCOSE F - 18 (FDG) INJECTION
9.2200 | Freq: Once | INTRAVENOUS | Status: AC
  Administered 2022-10-24: 9.22 via INTRAVENOUS

## 2022-11-11 ENCOUNTER — Other Ambulatory Visit: Payer: Self-pay

## 2022-11-11 DIAGNOSIS — C649 Malignant neoplasm of unspecified kidney, except renal pelvis: Secondary | ICD-10-CM

## 2022-11-12 ENCOUNTER — Inpatient Hospital Stay: Payer: Medicare Other

## 2022-11-12 ENCOUNTER — Other Ambulatory Visit: Payer: Self-pay

## 2022-11-12 ENCOUNTER — Inpatient Hospital Stay: Payer: Medicare Other | Attending: Hematology | Admitting: Hematology

## 2022-11-12 VITALS — BP 173/64 | HR 39 | Temp 97.3°F | Resp 20 | Wt 188.6 lb

## 2022-11-12 DIAGNOSIS — E1122 Type 2 diabetes mellitus with diabetic chronic kidney disease: Secondary | ICD-10-CM | POA: Diagnosis not present

## 2022-11-12 DIAGNOSIS — C7800 Secondary malignant neoplasm of unspecified lung: Secondary | ICD-10-CM | POA: Diagnosis not present

## 2022-11-12 DIAGNOSIS — M069 Rheumatoid arthritis, unspecified: Secondary | ICD-10-CM | POA: Diagnosis not present

## 2022-11-12 DIAGNOSIS — N189 Chronic kidney disease, unspecified: Secondary | ICD-10-CM | POA: Diagnosis not present

## 2022-11-12 DIAGNOSIS — C641 Malignant neoplasm of right kidney, except renal pelvis: Secondary | ICD-10-CM | POA: Diagnosis not present

## 2022-11-12 DIAGNOSIS — Z87891 Personal history of nicotine dependence: Secondary | ICD-10-CM | POA: Insufficient documentation

## 2022-11-12 DIAGNOSIS — I129 Hypertensive chronic kidney disease with stage 1 through stage 4 chronic kidney disease, or unspecified chronic kidney disease: Secondary | ICD-10-CM | POA: Insufficient documentation

## 2022-11-12 DIAGNOSIS — C649 Malignant neoplasm of unspecified kidney, except renal pelvis: Secondary | ICD-10-CM

## 2022-11-12 LAB — CMP (CANCER CENTER ONLY)
ALT: 34 U/L (ref 0–44)
AST: 31 U/L (ref 15–41)
Albumin: 3.9 g/dL (ref 3.5–5.0)
Alkaline Phosphatase: 151 U/L — ABNORMAL HIGH (ref 38–126)
Anion gap: 8 (ref 5–15)
BUN: 41 mg/dL — ABNORMAL HIGH (ref 8–23)
CO2: 23 mmol/L (ref 22–32)
Calcium: 8.5 mg/dL — ABNORMAL LOW (ref 8.9–10.3)
Chloride: 112 mmol/L — ABNORMAL HIGH (ref 98–111)
Creatinine: 3.19 mg/dL — ABNORMAL HIGH (ref 0.61–1.24)
GFR, Estimated: 20 mL/min — ABNORMAL LOW (ref >60)
Glucose, Bld: 67 mg/dL — ABNORMAL LOW (ref 70–99)
Potassium: 3.3 mmol/L — ABNORMAL LOW (ref 3.5–5.1)
Sodium: 143 mmol/L (ref 135–145)
Total Bilirubin: 0.7 mg/dL (ref 0.3–1.2)
Total Protein: 6.4 g/dL — ABNORMAL LOW (ref 6.5–8.1)

## 2022-11-12 LAB — CBC WITH DIFFERENTIAL (CANCER CENTER ONLY)
Abs Immature Granulocytes: 0.01 K/uL (ref 0.00–0.07)
Basophils Absolute: 0.1 K/uL (ref 0.0–0.1)
Basophils Relative: 1 %
Eosinophils Absolute: 0.2 K/uL (ref 0.0–0.5)
Eosinophils Relative: 3 %
HCT: 38.9 % — ABNORMAL LOW (ref 39.0–52.0)
Hemoglobin: 13.1 g/dL (ref 13.0–17.0)
Immature Granulocytes: 0 %
Lymphocytes Relative: 29 %
Lymphs Abs: 1.9 K/uL (ref 0.7–4.0)
MCH: 34.5 pg — ABNORMAL HIGH (ref 26.0–34.0)
MCHC: 33.7 g/dL (ref 30.0–36.0)
MCV: 102.4 fL — ABNORMAL HIGH (ref 80.0–100.0)
Monocytes Absolute: 1.1 K/uL — ABNORMAL HIGH (ref 0.1–1.0)
Monocytes Relative: 16 %
Neutro Abs: 3.3 K/uL (ref 1.7–7.7)
Neutrophils Relative %: 51 %
Platelet Count: 203 K/uL (ref 150–400)
RBC: 3.8 MIL/uL — ABNORMAL LOW (ref 4.22–5.81)
RDW: 13.3 % (ref 11.5–15.5)
WBC Count: 6.5 K/uL (ref 4.0–10.5)
nRBC: 0 % (ref 0.0–0.2)

## 2022-11-12 LAB — IRON AND IRON BINDING CAPACITY (CC-WL,HP ONLY)
Iron: 155 ug/dL (ref 45–182)
Saturation Ratios: 61 % — ABNORMAL HIGH (ref 17.9–39.5)
TIBC: 255 ug/dL (ref 250–450)
UIBC: 100 ug/dL — ABNORMAL LOW (ref 117–376)

## 2022-11-12 LAB — FERRITIN: Ferritin: 731 ng/mL — ABNORMAL HIGH (ref 24–336)

## 2022-11-12 NOTE — Progress Notes (Signed)
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 2) Remote history of metastatic renal cell carcinoma Diagnosed with renal cell carcinoma 20 years ago and had a right nephrectomy. About 8 years after that he was noted to have abdominal recurrence in his pancreas spleen and small intestine and had a Whipple's procedure and significant abdominal surgery and notes that 10 out of 17 lymph nodes were positive. He also had his gallbladder removed. Postoperative course was complicated by an internal hemorrhage as per his report. Patient notes 2-3 years after that he had recurrence in his thyroid that led to a thyroidectomy. [June 2004] later he had partial gastrectomy for local recurrence. [February 2005] when he presented with GI bleeding. Patient notes that he has had no known evidence of recurrence over the last 8 years until his recent CT scan showed lung nodules.  Current Treatment: Sutent 37.5 mg po daily for 2 weeks on and 1 weeks off. (on hold now from 10/2020) Previous treatment Sutent on cycle 1 - 50 mg by mouth daily for 4 weeks on and 2-weeks off. It was dose adjusted to 37.5 mg by mouth daily for 2 weeks with 1 week of to help mitigate issues with fatigue, mild hyperbilirubinemia, cytopenias. SBRT to dominant RML nodule.   INTERVAL HISTORY: Mr. Clovis RileyMitchell is a 72 y.o. male here for follow-up for his metastatic renal cell carcinoma. Patient was last seen by me on 08/20/22 and had been doing fairly well at that time. However, he continued to have RLE pain and BLE R>L swelling. He noted persistent bilateral hip pain  without improvement of his RA.  He is accompanied by his wife. Today, he states that he has been doing well overall. He did have an episode of increased leg swelling and pain after running out of Demadex for x3 days.  He was provided with a refill of Demadex and is now taking it Tid with great swelling relief. He denies any associated erythema.  In regards to his RA, he continues to have some hand and knee pains. He is considering cortisone injections in his knees.  We discussed his labs and PET scan results in detail today.  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, Ultrasound positive synovitis    Kidney disease    Macular degeneration    Bilateral eyes   Nephrolithiasis    Osteoarthritis of both hands 05/28/2016   Osteoarthritis of both knees 05/28/2016   Peripheral vascular disorder (HCC) 05/28/2016   Peyronie's syndrome    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   Rheumatoid arthritis (HCC)    Thyroid cancer (HCC) 05/28/2016   Past Surgical History:  Procedure Laterality Date   CATARACT EXTRACTION Bilateral 2013   CHOLECYSTECTOMY     CYSTOSCOPY/URETEROSCOPY/HOLMIUM LASER/STENT PLACEMENT Left 03/31/2022   Procedure: CYSTOSCOPY/ RETROGRADE/URETEROSCOPY/HOLMIUM LASER/STENT PLACEMENT;  Surgeon: Laverle PatterBorden,  Konrad Dolores, MD;  Location: [facility];  Service: Urology;  Laterality: Left;   GASTRECTOMY     tumor removed   GLAUCOMA SURGERY     Laser surgery   IR US GUIDE VASC ACCESS RIGHT  08/22/2021   IR VENOGRAM HEPATIC W HEMODYNAMIC EVALUATION  08/22/2021   LIPOMA EXCISION Right 1999   NEPHRECTOMY Right    PANCREATECTOMY     SPLENECTOMY     THYROIDECTOMY     URETERAL  STENT PLACEMENT Left 2012   WHIPPLE PROCEDURE     Social History   Tobacco Use   Smoking status: Former    Years: 1.5    Types: Cigarettes   Smokeless tobacco: Never  Vaping Use   Vaping Use: Never used  Substance Use Topics   Alcohol use: Yes    Comment: occasional beer   Drug use: No    ALLERGIES:  is allergic to carvedilol, iodine, and prozac [fluoxetine hcl].  MEDICATIONS:  Current Outpatient Medications  Medication Sig Dispense Refill   aspirin 81 MG tablet Take 81 mg by mouth daily.     atenolol (TENORMIN) 50 MG tablet Take 50 mg by mouth daily.     augmented betamethasone dipropionate (DIPROLENE-AF) 0.05 % cream Apply 1 application topically daily as needed (rash).   0   azelastine (OPTIVAR) 0.05 % ophthalmic solution Place 1 drop into both eyes in the morning and at bedtime.     calcium citrate-vitamin D (CITRACAL+D) 315-200 MG-UNIT per tablet Take 1 tablet by mouth 2 (two) times daily.     Cholecalciferol (VITAMIN D3) 50 MCG (2000 UT) TABS Take 2,000 Units by mouth daily. 2 Tablets daily     Cyanocobalamin (VITAMIN B-12) 2500 MCG SUBL Take 2,500 mcg by mouth daily.     diclofenac Sodium (VOLTAREN) 1 % GEL Apply 3 g topically 3 (three) times daily as needed (joint pain).      dicyclomine (BENTYL) 10 MG capsule Take 1 capsule (10 mg total) by mouth every 8 (eight) hours as needed for spasms. 30 capsule 1   docusate sodium (COLACE) 100 MG capsule Take 100 mg by mouth 2 (two) times daily as needed for mild constipation.      dorzolamide-timolol (COSOPT) 22.3-6.8 MG/ML ophthalmic solution Place 1 drop into both eyes 2 (two) times daily.   1   famotidine (PEPCID) 20 MG tablet Take 20 mg by mouth 3 (three) times daily.     gabapentin (NEURONTIN) 300 MG capsule Take 300 mg by mouth daily as needed (pain).     Glucosamine-MSM-Hyaluronic Acd 750-375-30 MG TABS Take 2 tablets by mouth daily.     hydrALAZINE (APRESOLINE) 50 MG tablet Take 50 mg by mouth 3 (three) times daily.      HYDROcodone-acetaminophen (NORCO/VICODIN) 5-325 MG tablet Take 1 tablet by mouth every 6 (six) hours as needed for severe pain.     insulin lispro (HUMALOG) 100 UNIT/ML injection Inject 2-8 Units into the skin 4 (four) times daily -  with meals and at bedtime. Per sliding scale     Lactobacillus (ACIDOPHILUS) CAPS capsule Take 1 capsule by mouth daily.     latanoprost (XALATAN) 0.005 % ophthalmic solution Place 1 drop into both eyes at bedtime.   1   levothyroxine (SYNTHROID) 125 MCG tablet Take 250 mcg by mouth daily before breakfast.     losartan (COZAAR) 25 MG tablet Take 25 mg by mouth daily.     lovastatin (MEVACOR) 40 MG tablet Take 40 mg by mouth at bedtime.  Multiple Vitamins-Minerals (PRESERVISION AREDS 2) CAPS Take 1 capsule by mouth 2 (two) times daily.     Omega-3 Fatty Acids (FISH OIL) 1200 MG CAPS Take 1 capsule by mouth daily.     omeprazole (PRILOSEC) 20 MG capsule Take 1 capsule (20 mg total) by mouth daily. (Patient taking differently: Take 20 mg by mouth at bedtime as needed (acid reflux).) 30 capsule 3   Pancrelipase, Lip-Prot-Amyl, 6000-19000 units CPEP Take 4 capsules by mouth 3 (three) times daily.     sildenafil (VIAGRA) 100 MG tablet Take 100 mg by mouth daily as needed for erectile dysfunction.     torsemide (DEMADEX) 20 MG tablet Take 40 mg by mouth in the morning, at noon, and at bedtime.     traMADol (ULTRAM) 50 MG tablet Take 50 mg by mouth 2 (two) times daily.     TRESIBA 100 UNIT/ML SOLN Inject 8 Units into the skin 2 (two) times daily. Take am and pm     LEVEMIR 100 UNIT/ML injection Inject 8-10 Units into the skin See admin instructions. Inject 8 units into the skin in the morning and 10 units at night (Patient not taking: Reported on 11/12/2022)  1   No current facility-administered medications for this visit.    REVIEW OF SYSTEMS:   10 Point review of Systems was done is negative except as noted above.  PHYSICAL EXAMINATION: ECOG FS:1 - Symptomatic but  completely ambulatory  Vitals:   11/12/22 0928  BP: (!) 173/64  Pulse: (!) 39  Resp: 20  Temp: (!) 97.3 F (36.3 C)  SpO2: 98%   Wt Readings from Last 3 Encounters:  11/12/22 188 lb 9.6 oz (85.5 kg)  08/20/22 185 lb 6.4 oz (84.1 kg)  04/11/22 178 lb 6.4 oz (80.9 kg)   Body mass index is 27.06 kg/m.      GENERAL:alert, in no acute distress and comfortable SKIN: no acute rashes, no significant lesions EYES: conjunctiva are pink and non-injected, sclera anicteric OROPHARYNX: MMM, no exudates, no oropharyngeal erythema or ulceration NECK: supple, no JVD LYMPH:  no palpable lymphadenopathy in the cervical, axillary or inguinal regions LUNGS: clear to auscultation b/l with normal respiratory effort HEART: regular rate & rhythm ABDOMEN:  normoactive bowel sounds, non tender, not distended. Extremity: b/l leg swelling pedal edema  PSYCH: alert & oriented x 3 with fluent speech NEURO: no focal motor/sensory deficits   LABORATORY DATA:  I have reviewed the data as listed  CBC    Component Value Date/Time   WBC 6.5 11/12/2022 0825   WBC 8.0 07/31/2022 1556   RBC 3.80 (L) 11/12/2022 0825   HGB 13.1 11/12/2022 0825   HGB 14.0 06/11/2017 0808   HCT 38.9 (L) 11/12/2022 0825   HCT 40.7 06/11/2017 0808   PLT 203 11/12/2022 0825   PLT 223 06/11/2017 0808   MCV 102.4 (H) 11/12/2022 0825   MCV 112.1 (H) 06/11/2017 0808   MCH 34.5 (H) 11/12/2022 0825   MCHC 33.7 11/12/2022 0825   RDW 13.3 11/12/2022 0825   RDW 13.9 06/11/2017 0808   LYMPHSABS 1.9 11/12/2022 0825   LYMPHSABS 1.6 06/11/2017 0808   MONOABS 1.1 (H) 11/12/2022 0825   MONOABS 0.5 06/11/2017 0808   EOSABS 0.2 11/12/2022 0825   EOSABS 0.1 06/11/2017 0808   BASOSABS 0.1 11/12/2022 0825   BASOSABS 0.0 06/11/2017 0808   Lab Results  Component Value Date   IRON 155 11/12/2022   TIBC 255 11/12/2022   FERRITIN 731 (H) 11/12/2022  Latest Ref Rng & Units 11/12/2022    8:25 AM 10/15/2022    2:16 PM 08/13/2022     7:38 AM  CMP  Glucose 70 - 99 mg/dL 67  161  096   BUN 8 - 23 mg/dL 41  44  66   Creatinine 0.61 - 1.24 mg/dL 0.45  4.09  8.11   Sodium 135 - 145 mmol/L 143  142  140   Potassium 3.5 - 5.1 mmol/L 3.3  3.6  3.0   Chloride 98 - 111 mmol/L 112  109  108   CO2 22 - 32 mmol/L 23  24  21    Calcium 8.9 - 10.3 mg/dL 8.5  8.0  8.5   Total Protein 6.5 - 8.1 g/dL 6.4  6.0  6.1   Total Bilirubin 0.3 - 1.2 mg/dL 0.7  1.0  0.7   Alkaline Phos 38 - 126 U/L 151  136  138   AST 15 - 41 U/L 31  36  36   ALT 0 - 44 U/L 34  39  41    08/18/2019 CT Abdomen Pelvis Wo Contrast (Accession [id number])   08/18/2019 CT Chest Wo Contrast (Accession [id number])   RADIOGRAPHIC STUDIES: I have personally reviewed the radiological images as listed and agreed with the findings in the report. NM PET Image Restag (PS) Skull Base To Thigh  Result Date: 10/27/2022 CLINICAL DATA:  Subsequent treatment strategy for renal cell cancer, status post nephrectomy, with multiple pulmonary nodules. EXAM: NUCLEAR MEDICINE PET SKULL BASE TO THIGH TECHNIQUE: 9.2 mCi F-18 FDG was injected intravenously. Full-ring PET imaging was performed from the skull base to thigh after the radiotracer. CT data was obtained and used for attenuation correction and anatomic localization. Fasting blood glucose: 155 mg/dl COMPARISON:  CT chest abdomen pelvis dated 08/14/2022 FINDINGS: Mediastinal blood pool activity: SUV max 3.3 Liver activity: SUV max NA NECK: No hypermetabolic cervical lymphadenopathy. Incidental CT findings: Status post thyroidectomy. CHEST: Scarring/atelectasis in the right middle lobe and anterior right lower lobe. Stable subcentimeter pulmonary nodules in the bilateral lower lobes (series 7/images 47, 67, 71, and 73), non FDG avid but beneath the size threshold for PET sensitivity. No hypermetabolic pulmonary nodules. No hypermetabolic thoracic lymphadenopathy. Incidental CT findings: Mild coronary atherosclerosis of the LAD.  ABDOMEN/PELVIS: Status post left nephrectomy. No abnormal hypermetabolism in the liver, pancreas, or adrenal glands. No hypermetabolic abdominopelvic lymphadenopathy. Incidental CT findings: Status post splenectomy. Status post cholecystectomy with postsurgical changes related to Whipple procedure. Atherosclerotic calcifications the abdominal aorta and branch vessels. Prostatomegaly, suggesting BPH. Thick-walled bladder, related to chronic bladder outlet obstruction. SKELETON: No focal hypermetabolic activity to suggest skeletal metastasis. Incidental CT findings: Mild degenerative changes of the visualized thoracolumbar spine. IMPRESSION: Status post left nephrectomy. Small bilateral pulmonary nodules are non FDG avid but beneath the size threshold for PET sensitivity. Continued attention on follow-up is suggested. Otherwise, no findings suspicious for metastatic disease. Additional stable ancillary and postsurgical findings, as above. Aortic Atherosclerosis (ICD10-I70.0). Electronically Signed   By: Charline Bills M.D.   On: 10/27/2022 03:13      ASSESSMENT & PLAN:   72 y.o. Caucasian male with  #1 Recurrent metastatic Renal cell carcinoma with multiple lung metastases. Has been on stable on 1st line Sutent for >13yr. Has had SBRT to the dominant right lung nodule with partial response. No lab or clinical evidence of metastatic RCC progression at this time.  PET/CT 08/08/2016 with a couple of mildly enlarge LLL pulmonary nodules - no other overt  evidence of RCC progression at this time.  PET CT scan 11/06/2016 - no overt evidence of RCC progression at this time .  PET/CT 03/13/2017 - no overt evidence of RCC progression at this time .  PET/CT 08/10/2017 - no overt evidence of RCC progression at this time .  PET/ CT done 08/10/2017, shows no evidence of  Progression at this time.  12/07/17 PET which revealed No findings suspicious for recurrent or metastatic disease.  03/22/18 PET/CT revealed One of  the left lower lobe nodules has very minimally enlarged over the last 2 years, currently 0.6 by 0.4 cm and measuring 0.4 by 0.3 cm on 03/20/2016. This probably represents a previously treated metastatic nodule given that it measured 1.0 by 0.8 cm on 06/06/2015. This nodule may be subject to some slice selection phenomenon and also motion artifact given proximity to the diaphragm. Surveillance is likely warranted. 2. Chronic central lucency, marginal bony deposition, and slow healing of the right lateral fourth rib fracture. Possibilities may include underlying radiation necrosis as a cause for fracture leading to slow healing, or a low-grade pathologic lesion. Maximum SUV at this fracture site is 2.9, previously 2.4. 3. 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 There is only low-grade activity in this vicinity characteristic of radiation fibrosis, without focal activity. 4. Other imaging findings of potential clinical significance: Aortic Atherosclerosis. Coronary atherosclerosis. Mild cardiomegaly. Right nephrectomy. 8 mm nonobstructive calculus in the left kidney lower pole.   08/24/18 CT C/A/P revealed "Stable small left lower lobe pulmonary nodules from recent prior studies. As previously noted, these have decreased in size from PET-CT 06/06/2015, likely treated metastases. No new or enlarging pulmonary nodules. 2. Postsurgical changes in the upper abdomen without evidence of local recurrence or metastatic disease. 3. Nonobstructing left renal calculus. 4. Stable radiation changes in the right upper lobe with probable chronic pathologic fracture of the right 4th rib laterally."  03/07/2019 CT C/A wo contrast revealed "1. Stable left lower lobe pulmonary nodules, likely treated Metastases. 2. No additional evidence of metastatic disease. 3. Left renal stone. 4. Post radiation scarring in the right hemithorax with a healed adjacent  right rib fracture which is presumably pathologic in Etiology. 5.  Aortic atherosclerosis (ICD10-170.0). 6. Enlarged pulmonic trunk, indicative of pulmonary arterial Hypertension."  08/14/22 CT CAP: Extensive surgical changes in the abdomen from right nephrectomy, splenectomy and presumed Whipple procedure. No abnormal soft tissue or lymph node enlargement seen in the abdomen and pelvis including towards the surgical bed. Multiple small lung nodules. These are similar in number but several have slightly increased in size. Stable fracture involving the lateral aspect of the right fourth rib with some irregularity. Please correlate for a metastatic lesion.  10/24/22 NM PET: Status post left nephrectomy. Small bilateral pulmonary nodules are non FDG avid but beneath the size threshold for PET sensitivity. Continued attention on follow-up is suggested. Otherwise, no findings suspicious for metastatic disease. Additional stable ancillary and postsurgical findings, as above. Aortic Atherosclerosis (ICD10-I70.0).  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 Corliss Skains for mx  #6 DM2 -continue f/u with Dr Eloise Harman for continue mx, blood sugar at home per patient.   #7postsplenectomy status  #8 abnormal liver function tests.  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  PLAN: -discussed lab results from today with the patient. CBC is stable- HGB of 13.1. CMP showed chronic kidney disease with creatinine of 3.19 and potassium of 3.3. Iron of 155, TIBC of 255, saturation of 61. Ferritin of 731 - Goal of ferritin <500 if tolerated by hgb -discussed the recent PET scan from 10/24/22 with the patient and his wife which showed no overt evidence of  progression of patients RCC. - Will continue observation of any palliative systemic treatments at this time.  FOLLOW-UP: Follow-up as per schedule phlebotomy 12/10/2022 Cancel currently scheduled lab and phlebotomy on 02/04/2023 RTC with Dr Candise Che with labs in 4 months    The total time spent in the appointment was 30 minutes* .  All of the patient's questions were answered with apparent satisfaction. The patient knows to call the clinic with any problems, questions or concerns.   Wyvonnia Lora MD MS AAHIVMS [facility] Hematology/Oncology Physician [facility]  .*Total Encounter Time as defined by the Centers for Medicare and Medicaid Services includes, in addition to the face-to-face time of a patient visit (documented in the note above) non-face-to-face time: obtaining and reviewing outside history, ordering and reviewing medications, tests or procedures, care coordination (communications with other health care professionals or caregivers) and documentation in the medical record.   I,Alexis Herring,acting as a Neurosurgeon for Wyvonnia Lora, MD.,have documented all relevant documentation on the behalf of Wyvonnia Lora, MD,as directed by  Wyvonnia Lora, MD while in the presence of Wyvonnia Lora, MD.  .I have reviewed the above documentation for accuracy and completeness, and I agree with the above. Johney Maine MD

## 2022-11-13 ENCOUNTER — Encounter: Payer: Self-pay | Admitting: Hematology

## 2022-12-09 ENCOUNTER — Telehealth: Payer: Self-pay | Admitting: Hematology

## 2022-12-09 NOTE — Telephone Encounter (Signed)
Contacted patient to scheduled appointments. Patient is aware of appointments that are scheduled.   

## 2022-12-10 ENCOUNTER — Other Ambulatory Visit: Payer: Medicare Other

## 2022-12-25 ENCOUNTER — Other Ambulatory Visit: Payer: Self-pay

## 2022-12-25 DIAGNOSIS — C649 Malignant neoplasm of unspecified kidney, except renal pelvis: Secondary | ICD-10-CM

## 2022-12-26 ENCOUNTER — Inpatient Hospital Stay: Payer: Medicare Other

## 2022-12-26 ENCOUNTER — Inpatient Hospital Stay: Payer: Medicare Other | Attending: Hematology

## 2022-12-26 DIAGNOSIS — R918 Other nonspecific abnormal finding of lung field: Secondary | ICD-10-CM | POA: Insufficient documentation

## 2022-12-26 DIAGNOSIS — C641 Malignant neoplasm of right kidney, except renal pelvis: Secondary | ICD-10-CM | POA: Diagnosis present

## 2022-12-26 DIAGNOSIS — C649 Malignant neoplasm of unspecified kidney, except renal pelvis: Secondary | ICD-10-CM

## 2022-12-26 LAB — CMP (CANCER CENTER ONLY)
ALT: 47 U/L — ABNORMAL HIGH (ref 0–44)
AST: 43 U/L — ABNORMAL HIGH (ref 15–41)
Albumin: 3.9 g/dL (ref 3.5–5.0)
Alkaline Phosphatase: 139 U/L — ABNORMAL HIGH (ref 38–126)
Anion gap: 10 (ref 5–15)
BUN: 82 mg/dL — ABNORMAL HIGH (ref 8–23)
CO2: 19 mmol/L — ABNORMAL LOW (ref 22–32)
Calcium: 8.6 mg/dL — ABNORMAL LOW (ref 8.9–10.3)
Chloride: 108 mmol/L (ref 98–111)
Creatinine: 3.97 mg/dL — ABNORMAL HIGH (ref 0.61–1.24)
GFR, Estimated: 15 mL/min — ABNORMAL LOW (ref >60)
Glucose, Bld: 299 mg/dL — ABNORMAL HIGH (ref 70–99)
Potassium: 2.9 mmol/L — ABNORMAL LOW (ref 3.5–5.1)
Sodium: 137 mmol/L (ref 135–145)
Total Bilirubin: 0.6 mg/dL (ref 0.3–1.2)
Total Protein: 6.4 g/dL — ABNORMAL LOW (ref 6.5–8.1)

## 2022-12-26 LAB — CBC WITH DIFFERENTIAL (CANCER CENTER ONLY)
Abs Immature Granulocytes: 0.01 10*3/uL (ref 0.00–0.07)
Basophils Absolute: 0 10*3/uL (ref 0.0–0.1)
Basophils Relative: 1 %
Eosinophils Absolute: 0.1 10*3/uL (ref 0.0–0.5)
Eosinophils Relative: 2 %
HCT: 38.9 % — ABNORMAL LOW (ref 39.0–52.0)
Hemoglobin: 12.9 g/dL — ABNORMAL LOW (ref 13.0–17.0)
Immature Granulocytes: 0 %
Lymphocytes Relative: 24 %
Lymphs Abs: 1.6 10*3/uL (ref 0.7–4.0)
MCH: 33.9 pg (ref 26.0–34.0)
MCHC: 33.2 g/dL (ref 30.0–36.0)
MCV: 102.4 fL — ABNORMAL HIGH (ref 80.0–100.0)
Monocytes Absolute: 0.7 10*3/uL (ref 0.1–1.0)
Monocytes Relative: 11 %
Neutro Abs: 4.2 10*3/uL (ref 1.7–7.7)
Neutrophils Relative %: 62 %
Platelet Count: 220 10*3/uL (ref 150–400)
RBC: 3.8 MIL/uL — ABNORMAL LOW (ref 4.22–5.81)
RDW: 13.6 % (ref 11.5–15.5)
WBC Count: 6.6 10*3/uL (ref 4.0–10.5)
nRBC: 0 % (ref 0.0–0.2)

## 2022-12-26 LAB — IRON AND IRON BINDING CAPACITY (CC-WL,HP ONLY)
Iron: 164 ug/dL (ref 45–182)
Saturation Ratios: 71 % — ABNORMAL HIGH (ref 17.9–39.5)
TIBC: 232 ug/dL — ABNORMAL LOW (ref 250–450)
UIBC: 68 ug/dL — ABNORMAL LOW (ref 117–376)

## 2022-12-26 LAB — FERRITIN: Ferritin: 1048 ng/mL — ABNORMAL HIGH (ref 24–336)

## 2022-12-26 NOTE — Patient Instructions (Signed)
EAT SOMETHING PRIOR TO FUTURE APPOINTMENTS :)  Therapeutic Phlebotomy, Care After The following information offers guidance on how to care for yourself after your procedure. Your health care provider may also give you more specific instructions. If you have problems or questions, contact your health care provider. What can I expect after the procedure? After therapeutic phlebotomy, it is common to have: Light-headedness or dizziness. You may feel faint. Nausea. Tiredness (fatigue). Follow these instructions at home: Eating and drinking Be sure to eat well-balanced meals for the next 24 hours. Drink enough fluid to keep your urine pale yellow. Avoid drinking alcohol on the day that you had the procedure. Activity  Return to your normal activities as told by your health care provider. Most people can go back to their normal activities right away. Avoid activities that take a lot of effort for about 5 hours after the procedure. Athletes should avoid strenuous exercise for at least 12 hours. Avoid heavy lifting or pulling for about 5 hours after the procedure. Do not lift anything that is heavier than 10 lb (4.5 kg). Change positions slowly for the remainder of the day, like from sitting to standing. This can help prevent light-headedness or fainting. If you feel light-headed, lie down until the feeling goes away. Needle insertion site care  Keep your bandage (dressing) dry. You can remove the bandage after about 5 hours or as told by your health care provider. If you have bleeding from the needle insertion site, raise (elevate) your arm and press firmly on the site until the bleeding stops. If you have bruising at the site, apply ice to the area. To do this: Put ice in a plastic bag. Place a towel between your skin and the bag. Leave the ice on for 20 minutes, 2-3 times a day for the first 24 hours. Remove the ice if your skin turns bright red so you do not damage the area. If the swelling  does not go away after 24 hours, apply a warm, moist cloth (warm compress) to the area for 20 minutes, 2-3 times a day. General instructions Do not use any products that contain nicotine or tobacco, like cigarettes, chewing tobacco, and vaping devices, such as e-cigarettes, for at least 30 minutes after the procedure. If you need help quitting, ask your health care provider. Keep all follow-up visits. You may need to continue having regular blood tests and therapeutic phlebotomy treatments as directed. Contact a health care provider if: You have redness, swelling, or pain at the needle insertion site. Fluid or blood is coming from the needle insertion site. Pus or a bad smell is coming from the needle insertion site. The needle insertion site feels warm to the touch. You feel light-headed, dizzy, or nauseous, and the feeling does not go away. You have new bruising at the needle insertion site. You feel weaker than normal. You have a fever or chills. Get help right away if: You have chest pain. You have trouble breathing. You have severe nausea or vomiting. Summary After the procedure, it is common to have some light-headedness, dizziness, nausea, or tiredness (fatigue). Be sure to eat well-balanced meals for the next 24 hours. Drink enough fluid to keep your urine pale yellow. Return to your normal activities as told by your health care provider. Keep all follow-up visits. You may need to continue having regular blood tests and therapeutic phlebotomy treatments as directed. This information is not intended to replace advice given to you by your health care provider.  Make sure you discuss any questions you have with your health care provider. Document Revised: 01/16/2021 Document Reviewed: 01/16/2021 Elsevier Patient Education  2024 [employer].

## 2022-12-26 NOTE — Progress Notes (Signed)
Dylan Jefferson presents today for phlebotomy per MD orders. Phlebotomy procedure started at 0825 and ended at [phone].  About 12 minutes into phlebotomy, patient stated that he was feeling very weak, lightheaded & lethargic. appeared pale and cool. Phlebotomy stopped at 415g. Pt reclined in seat and 1L IVF initiated. BG 135. VS as follows: 129/68, HR 35, O2 98% RR 22. HR chronically low per pt report but usually maintains 45-55 bpm. Jae Dire, PA-C at [facility]. Pt stated that he did not eat prior to procedure as his blood sugar had spiked early this morning and he was trying to manage it. Pt gave himself home insulin prior to appointment time. Pt given juice and snack. After approximately 15 minutes, patient stated that he felt "so much better". VS 104/58, HR 51, RR 16, O2 98%.   Patient observed for 45 minutes after procedure without any additional incident. Pt given copy of d/c instructions and given phone number to cancer center if should need anything. Verbalizes understanding  IV needle removed intact.  Pt left ambulatory with family for discharge.

## 2023-02-04 ENCOUNTER — Other Ambulatory Visit: Payer: Medicare Other

## 2023-03-11 ENCOUNTER — Ambulatory Visit: Payer: Medicare Other | Admitting: Hematology

## 2023-03-11 ENCOUNTER — Other Ambulatory Visit: Payer: Self-pay

## 2023-03-11 ENCOUNTER — Other Ambulatory Visit: Payer: Medicare Other

## 2023-03-12 ENCOUNTER — Other Ambulatory Visit: Payer: Self-pay

## 2023-03-12 ENCOUNTER — Inpatient Hospital Stay: Payer: Medicare Other | Attending: Hematology

## 2023-03-12 ENCOUNTER — Inpatient Hospital Stay: Payer: Medicare Other | Admitting: Hematology

## 2023-03-12 VITALS — BP 174/88 | HR 51 | Temp 97.8°F | Resp 20 | Wt 188.0 lb

## 2023-03-12 DIAGNOSIS — M069 Rheumatoid arthritis, unspecified: Secondary | ICD-10-CM | POA: Diagnosis not present

## 2023-03-12 DIAGNOSIS — Z85850 Personal history of malignant neoplasm of thyroid: Secondary | ICD-10-CM | POA: Insufficient documentation

## 2023-03-12 DIAGNOSIS — I700 Atherosclerosis of aorta: Secondary | ICD-10-CM | POA: Insufficient documentation

## 2023-03-12 DIAGNOSIS — R7989 Other specified abnormal findings of blood chemistry: Secondary | ICD-10-CM | POA: Diagnosis not present

## 2023-03-12 DIAGNOSIS — Z85528 Personal history of other malignant neoplasm of kidney: Secondary | ICD-10-CM | POA: Insufficient documentation

## 2023-03-12 DIAGNOSIS — Z9081 Acquired absence of spleen: Secondary | ICD-10-CM | POA: Diagnosis not present

## 2023-03-12 DIAGNOSIS — Z87891 Personal history of nicotine dependence: Secondary | ICD-10-CM | POA: Diagnosis not present

## 2023-03-12 DIAGNOSIS — C649 Malignant neoplasm of unspecified kidney, except renal pelvis: Secondary | ICD-10-CM | POA: Diagnosis not present

## 2023-03-12 DIAGNOSIS — Z9049 Acquired absence of other specified parts of digestive tract: Secondary | ICD-10-CM | POA: Diagnosis not present

## 2023-03-12 DIAGNOSIS — E1122 Type 2 diabetes mellitus with diabetic chronic kidney disease: Secondary | ICD-10-CM | POA: Insufficient documentation

## 2023-03-12 DIAGNOSIS — N189 Chronic kidney disease, unspecified: Secondary | ICD-10-CM | POA: Insufficient documentation

## 2023-03-12 DIAGNOSIS — Z905 Acquired absence of kidney: Secondary | ICD-10-CM | POA: Insufficient documentation

## 2023-03-12 LAB — CBC WITH DIFFERENTIAL (CANCER CENTER ONLY)
Abs Immature Granulocytes: 0.02 10*3/uL (ref 0.00–0.07)
Basophils Absolute: 0.1 10*3/uL (ref 0.0–0.1)
Basophils Relative: 1 %
Eosinophils Absolute: 0.1 10*3/uL (ref 0.0–0.5)
Eosinophils Relative: 2 %
HCT: 42.7 % (ref 39.0–52.0)
Hemoglobin: 14.2 g/dL (ref 13.0–17.0)
Immature Granulocytes: 0 %
Lymphocytes Relative: 30 %
Lymphs Abs: 1.9 10*3/uL (ref 0.7–4.0)
MCH: 34.1 pg — ABNORMAL HIGH (ref 26.0–34.0)
MCHC: 33.3 g/dL (ref 30.0–36.0)
MCV: 102.4 fL — ABNORMAL HIGH (ref 80.0–100.0)
Monocytes Absolute: 1.1 10*3/uL — ABNORMAL HIGH (ref 0.1–1.0)
Monocytes Relative: 16 %
Neutro Abs: 3.3 10*3/uL (ref 1.7–7.7)
Neutrophils Relative %: 51 %
Platelet Count: 205 10*3/uL (ref 150–400)
RBC: 4.17 MIL/uL — ABNORMAL LOW (ref 4.22–5.81)
RDW: 14.1 % (ref 11.5–15.5)
WBC Count: 6.5 10*3/uL (ref 4.0–10.5)
nRBC: 0 % (ref 0.0–0.2)

## 2023-03-12 LAB — CMP (CANCER CENTER ONLY)
ALT: 43 U/L (ref 0–44)
AST: 36 U/L (ref 15–41)
Albumin: 4 g/dL (ref 3.5–5.0)
Alkaline Phosphatase: 161 U/L — ABNORMAL HIGH (ref 38–126)
Anion gap: 7 (ref 5–15)
BUN: 45 mg/dL — ABNORMAL HIGH (ref 8–23)
CO2: 24 mmol/L (ref 22–32)
Calcium: 8.8 mg/dL — ABNORMAL LOW (ref 8.9–10.3)
Chloride: 114 mmol/L — ABNORMAL HIGH (ref 98–111)
Creatinine: 3.2 mg/dL — ABNORMAL HIGH (ref 0.61–1.24)
GFR, Estimated: 20 mL/min — ABNORMAL LOW (ref >60)
Glucose, Bld: 113 mg/dL — ABNORMAL HIGH (ref 70–99)
Potassium: 3.8 mmol/L (ref 3.5–5.1)
Sodium: 145 mmol/L (ref 135–145)
Total Bilirubin: 0.6 mg/dL (ref 0.3–1.2)
Total Protein: 6.5 g/dL (ref 6.5–8.1)

## 2023-03-12 LAB — IRON AND IRON BINDING CAPACITY (CC-WL,HP ONLY)
Iron: 146 ug/dL (ref 45–182)
Saturation Ratios: 55 % — ABNORMAL HIGH (ref 17.9–39.5)
TIBC: 265 ug/dL (ref 250–450)
UIBC: 119 ug/dL (ref 117–376)

## 2023-03-12 LAB — FERRITIN: Ferritin: 946 ng/mL — ABNORMAL HIGH (ref 24–336)
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
## 2023-03-13 ENCOUNTER — Telehealth: Payer: Self-pay | Admitting: Hematology

## 2023-03-13 NOTE — Telephone Encounter (Signed)
Scheduled per 08/08 los, patient has been called and notified of upcoming appointments.

## 2023-03-31 ENCOUNTER — Other Ambulatory Visit: Payer: Self-pay

## 2023-03-31 DIAGNOSIS — C649 Malignant neoplasm of unspecified kidney, except renal pelvis: Secondary | ICD-10-CM

## 2023-04-01 ENCOUNTER — Inpatient Hospital Stay: Payer: Medicare Other

## 2023-04-01 DIAGNOSIS — Z85528 Personal history of other malignant neoplasm of kidney: Secondary | ICD-10-CM | POA: Diagnosis not present

## 2023-04-01 DIAGNOSIS — C649 Malignant neoplasm of unspecified kidney, except renal pelvis: Secondary | ICD-10-CM

## 2023-04-01 LAB — CBC WITH DIFFERENTIAL (CANCER CENTER ONLY)
Abs Immature Granulocytes: 0.03 10*3/uL (ref 0.00–0.07)
Basophils Absolute: 0.1 10*3/uL (ref 0.0–0.1)
Basophils Relative: 1 %
Eosinophils Absolute: 0.1 10*3/uL (ref 0.0–0.5)
Eosinophils Relative: 2 %
HCT: 41.8 % (ref 39.0–52.0)
Hemoglobin: 13.5 g/dL (ref 13.0–17.0)
Immature Granulocytes: 0 %
Lymphocytes Relative: 27 %
Lymphs Abs: 2.1 10*3/uL (ref 0.7–4.0)
MCH: 33.1 pg (ref 26.0–34.0)
MCHC: 32.3 g/dL (ref 30.0–36.0)
MCV: 102.5 fL — ABNORMAL HIGH (ref 80.0–100.0)
Monocytes Absolute: 1.3 10*3/uL — ABNORMAL HIGH (ref 0.1–1.0)
Monocytes Relative: 17 %
Neutro Abs: 4.1 10*3/uL (ref 1.7–7.7)
Neutrophils Relative %: 53 %
Platelet Count: 208 10*3/uL (ref 150–400)
RBC: 4.08 MIL/uL — ABNORMAL LOW (ref 4.22–5.81)
RDW: 13.7 % (ref 11.5–15.5)
WBC Count: 7.8 10*3/uL (ref 4.0–10.5)
nRBC: 0 % (ref 0.0–0.2)

## 2023-04-01 LAB — CMP (CANCER CENTER ONLY)
ALT: 41 U/L (ref 0–44)
AST: 33 U/L (ref 15–41)
Albumin: 3.8 g/dL (ref 3.5–5.0)
Alkaline Phosphatase: 180 U/L — ABNORMAL HIGH (ref 38–126)
Anion gap: 10 (ref 5–15)
BUN: 77 mg/dL — ABNORMAL HIGH (ref 8–23)
CO2: 20 mmol/L — ABNORMAL LOW (ref 22–32)
Calcium: 8 mg/dL — ABNORMAL LOW (ref 8.9–10.3)
Chloride: 107 mmol/L (ref 98–111)
Creatinine: 4.18 mg/dL — ABNORMAL HIGH (ref 0.61–1.24)
GFR, Estimated: 14 mL/min — ABNORMAL LOW (ref >60)
Glucose, Bld: 341 mg/dL — ABNORMAL HIGH (ref 70–99)
Potassium: 3.6 mmol/L (ref 3.5–5.1)
Sodium: 137 mmol/L (ref 135–145)
Total Bilirubin: 0.5 mg/dL (ref 0.3–1.2)
Total Protein: 6.1 g/dL — ABNORMAL LOW (ref 6.5–8.1)

## 2023-04-01 NOTE — Progress Notes (Signed)
Dylan Jefferson presents today for phlebotomy per MD orders. First PIV attempt unsuccessful. Phlebotomy procedure started at 0901 and ended at 0932.18 G PIV used for all stick attempts. An 39 G PIV to the left posterior distal FA obtained.  176 grams removed. At 0932 Pt clotted off and PIV removed.  Ryan RN attempted a third PIV on Pt, but was unsuccessful. Pt refused a forth PIV attempt.  Patient observed for 30 minutes after procedure without any incident. Patient tolerated procedure well. IV needle removed intact. VSS at discharge.  Pt ambulatory to lobby with spouse at the time of discharge.

## 2023-04-01 NOTE — Patient Instructions (Signed)

## 2023-06-08 ENCOUNTER — Other Ambulatory Visit: Payer: Self-pay | Admitting: *Deleted

## 2023-06-08 DIAGNOSIS — N186 End stage renal disease: Secondary | ICD-10-CM

## 2023-06-09 ENCOUNTER — Ambulatory Visit: Payer: Medicare Other | Admitting: Vascular Surgery

## 2023-06-09 ENCOUNTER — Ambulatory Visit (HOSPITAL_COMMUNITY)
Admission: RE | Admit: 2023-06-09 | Discharge: 2023-06-09 | Disposition: A | Discharge status: Home / Self Care | Payer: Medicare Other | Source: Ambulatory Visit | Attending: Vascular Surgery | Admitting: Vascular Surgery

## 2023-06-09 ENCOUNTER — Ambulatory Visit (INDEPENDENT_AMBULATORY_CARE_PROVIDER_SITE_OTHER)
Admission: RE | Admit: 2023-06-09 | Discharge: 2023-06-09 | Disposition: A | Discharge status: Home / Self Care | Payer: Medicare Other | Source: Ambulatory Visit | Attending: Vascular Surgery | Admitting: Vascular Surgery

## 2023-06-09 ENCOUNTER — Encounter: Payer: Self-pay | Admitting: Vascular Surgery

## 2023-06-09 VITALS — BP 165/75 | HR 67 | Temp 98.0°F | Resp 18 | Ht 70.0 in | Wt 181.9 lb

## 2023-06-09 DIAGNOSIS — N186 End stage renal disease: Secondary | ICD-10-CM | POA: Insufficient documentation

## 2023-06-09 DIAGNOSIS — N185 Chronic kidney disease, stage 5: Secondary | ICD-10-CM | POA: Diagnosis not present

## 2023-06-09 NOTE — Progress Notes (Signed)
[facility] [place]  ASSESSMENT / PLAN: Dylan Jefferson is a 72 y.o. right handed male in need of permanent dialysis access. I reviewed options for dialysis in detail with the patient, including hemodialysis and peritoneal dialysis. I counseled the patient to ask their nephrologist about their candidacy for renal transplant. I counseled the patient that dialysis access requires surveillance and periodic maintenance. Plan to proceed with left brachiobasilic first stage arteriovenous fistula.   CHIEF COMPLAINT: worsening renal function.   HISTORY OF PRESENT ILLNESS: Dylan Jefferson is a 72 y.o. male with chronic kidney disease who presents to the clinic for evaluation of permanent dialysis access.  Patient has a complex surgical history including multiple surgical interventions for metastatic renal cell carcinoma.  He is unremarkably well since this diagnosis was made almost 20 years ago.  Unfortunately his solitary kidney is deteriorated to the point of needing dialysis.  We reviewed modalities for dialysis and the rationale for arteriovenous fistula creation.  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   Sero Negative, Ultrasound positive synovitis    Kidney disease    Macular degeneration    Bilateral eyes   Nephrolithiasis    Osteoarthritis of both hands 05/28/2016   Osteoarthritis of both knees 05/28/2016   Peripheral vascular disorder (HCC) 05/28/2016   Peyronie's syndrome    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   Rheumatoid arthritis (HCC)    Thyroid cancer (HCC) 05/28/2016    Past Surgical History:  Procedure Laterality Date   CATARACT EXTRACTION Bilateral  2013   CHOLECYSTECTOMY     CYSTOSCOPY/URETEROSCOPY/HOLMIUM LASER/STENT PLACEMENT Left 03/31/2022   Procedure: CYSTOSCOPY/ RETROGRADE/URETEROSCOPY/HOLMIUM LASER/STENT PLACEMENT;  Surgeon: Heloise Purpura, MD;  Location: [facility];  Service: Urology;  Laterality: Left;   GASTRECTOMY     tumor removed   GLAUCOMA SURGERY     Laser surgery   IR US GUIDE VASC ACCESS RIGHT  08/22/2021   IR VENOGRAM HEPATIC W HEMODYNAMIC EVALUATION  08/22/2021   LIPOMA EXCISION Right 1999   NEPHRECTOMY Right    PANCREATECTOMY     SPLENECTOMY     THYROIDECTOMY     URETERAL STENT PLACEMENT Left 2012   WHIPPLE PROCEDURE      Family History  Problem Relation Age of Onset   Bleeding Disorder Mother        Unknown what type   Osteoporosis Mother    Heart disease Mother    Diabetes Mother    Hypertension Mother    Lymphoma Father        Hodgkin's lymphoma   Other Daughter        Brain stent   Colon cancer Neg Hx     Social History   Socioeconomic History   Marital status: Married    Spouse name: Not on file   Number of children: 1   Years of education: Not on file   Highest education level: Not on file  Occupational History   Occupation: Disabled  Tobacco Use   Smoking status: Former    Types: Cigarettes   Smokeless tobacco: Never  Vaping Use   Vaping status: Never Used  Substance and Sexual Activity   Alcohol  use: Yes    Comment: occasional beer   Drug use: No   Sexual activity: Not Currently  Other Topics Concern   Not on file  Social History Narrative   Not on file   Social Determinants of Health   Financial Resource Strain: Low Risk  (10/03/2022)   Overall Financial Resource Strain (CARDIA)    Difficulty of Paying Living Expenses: Not hard at all  Food Insecurity: No Food Insecurity (10/03/2022)   Hunger Vital Sign    Worried About Running Out of Food in the Last Year: Never true    Ran Out of Food in the Last Year: Never true  Transportation Needs: No Transportation Needs (10/03/2022)    PRAPARE - Administrator, Civil Service (Medical): No    Lack of Transportation (Non-Medical): No  Physical Activity: Not on file  Stress: Not on file  Social Connections: Not on file  Intimate Partner Violence: Not At Risk (10/03/2022)   Humiliation, Afraid, Rape, and Kick questionnaire    Fear of Current or Ex-Partner: No    Emotionally Abused: No    Physically Abused: No    Sexually Abused: No    Allergies  Allergen Reactions   Carvedilol     Other reaction(s): altered taste   Iodine     "Old iodine" per patient    Prozac [Fluoxetine Hcl] Diarrhea    Current Outpatient Medications  Medication Sig Dispense Refill   aspirin 81 MG tablet Take 81 mg by mouth daily.     augmented betamethasone dipropionate (DIPROLENE-AF) 0.05 % cream Apply 1 application topically daily as needed (rash).   0   azelastine (OPTIVAR) 0.05 % ophthalmic solution Place 1 drop into both eyes in the morning and at bedtime.     calcium citrate-vitamin D (CITRACAL+D) 315-200 MG-UNIT per tablet Take 1 tablet by mouth 2 (two) times daily.     Cholecalciferol (VITAMIN D3) 50 MCG (2000 UT) TABS Take 2,000 Units by mouth daily. 2 Tablets daily     Cyanocobalamin (VITAMIN B-12) 2500 MCG SUBL Take 2,500 mcg by mouth daily.     diclofenac Sodium (VOLTAREN) 1 % GEL Apply 3 g topically 3 (three) times daily as needed (joint pain).      dicyclomine (BENTYL) 10 MG capsule Take 1 capsule (10 mg total) by mouth every 8 (eight) hours as needed for spasms. 30 capsule 1   docusate sodium (COLACE) 100 MG capsule Take 100 mg by mouth 2 (two) times daily as needed for mild constipation.      dorzolamide-timolol (COSOPT) 22.3-6.8 MG/ML ophthalmic solution Place 1 drop into both eyes 2 (two) times daily.   1   famotidine (PEPCID) 20 MG tablet Take 20 mg by mouth 3 (three) times daily.     gabapentin (NEURONTIN) 300 MG capsule Take 300 mg by mouth daily as needed (pain).     Glucosamine-MSM-Hyaluronic Acd 750-375-30 MG  TABS Take 2 tablets by mouth daily.     hydrALAZINE (APRESOLINE) 50 MG tablet Take 50 mg by mouth 3 (three) times daily.     HYDROcodone-acetaminophen (NORCO/VICODIN) 5-325 MG tablet Take 1 tablet by mouth every 6 (six) hours as needed for severe pain.     insulin lispro (HUMALOG) 100 UNIT/ML injection Inject 2-8 Units into the skin 4 (four) times daily -  with meals and at bedtime. Per sliding scale     Lactobacillus (ACIDOPHILUS) CAPS capsule Take 1 capsule by mouth daily.     latanoprost (XALATAN) 0.005 % ophthalmic  solution Place 1 drop into both eyes at bedtime.   1   LEVEMIR 100 UNIT/ML injection Inject 8-10 Units into the skin See admin instructions. Inject 8 units into the skin in the morning and 10 units at night  1   levothyroxine (SYNTHROID) 125 MCG tablet Take 250 mcg by mouth daily before breakfast.     lovastatin (MEVACOR) 40 MG tablet Take 40 mg by mouth at bedtime.      Multiple Vitamins-Minerals (PRESERVISION AREDS 2) CAPS Take 1 capsule by mouth 2 (two) times daily.     Omega-3 Fatty Acids (FISH OIL) 1200 MG CAPS Take 1 capsule by mouth daily.     omeprazole (PRILOSEC) 20 MG capsule Take 1 capsule (20 mg total) by mouth daily. (Patient taking differently: Take 20 mg by mouth at bedtime as needed (acid reflux).) 30 capsule 3   Pancrelipase, Lip-Prot-Amyl, 6000-19000 units CPEP Take 4 capsules by mouth 3 (three) times daily.     sildenafil (VIAGRA) 100 MG tablet Take 100 mg by mouth daily as needed for erectile dysfunction.     torsemide (DEMADEX) 20 MG tablet Take 40 mg by mouth in the morning, at noon, and at bedtime.     traMADol (ULTRAM) 50 MG tablet Take 50 mg by mouth 2 (two) times daily.     TRESIBA 100 UNIT/ML SOLN Inject 8 Units into the skin 2 (two) times daily. Take am and pm     atenolol (TENORMIN) 50 MG tablet Take 50 mg by mouth daily. (Patient not taking: Reported on 06/09/2023)     losartan (COZAAR) 25 MG tablet Take 25 mg by mouth daily. (Patient not taking: Reported  on 06/09/2023)     No current facility-administered medications for this visit.    PHYSICAL EXAM Vitals:   06/09/23 0959  BP: (!) 165/75  Pulse: 67  Resp: 18  Temp: 98 F (36.7 C)  TempSrc: Temporal  SpO2: 95%  Weight: 181 lb 14.4 oz (82.5 kg)  Height: 5\' 10"  (1.778 m)   Elderly man in no distress Regular rate and rhythm Unlabored breathing 2+ radial pulses bilaterally   PERTINENT LABORATORY AND RADIOLOGIC DATA  Most recent CBC    Latest Ref Rng & Units 04/01/2023    7:33 AM 03/12/2023    8:33 AM 12/26/2022    7:30 AM  CBC  WBC 4.0 - 10.5 K/uL 7.8  6.5  6.6   Hemoglobin 13.0 - 17.0 g/dL 16.1  09.6  04.5   Hematocrit 39.0 - 52.0 % 41.8  42.7  38.9   Platelets 150 - 400 K/uL 208  205  220      Most recent CMP    Latest Ref Rng & Units 04/01/2023    7:33 AM 03/12/2023    8:33 AM 12/26/2022    7:30 AM  CMP  Glucose 70 - 99 mg/dL 409  811  914   BUN 8 - 23 mg/dL 77  45  82   Creatinine 0.61 - 1.24 mg/dL 7.82  9.56  2.13   Sodium 135 - 145 mmol/L 137  145  137   Potassium 3.5 - 5.1 mmol/L 3.6  3.8  2.9   Chloride 98 - 111 mmol/L 107  114  108   CO2 22 - 32 mmol/L 20  24  19    Calcium 8.9 - 10.3 mg/dL 8.0  8.8  8.6   Total Protein 6.5 - 8.1 g/dL 6.1  6.5  6.4   Total Bilirubin 0.3 - 1.2 mg/dL  0.5  0.6  0.6   Alkaline Phos 38 - 126 U/L 180  161  139   AST 15 - 41 U/L 33  36  43   ALT 0 - 44 U/L 41  43  47    Left arm basilic vein appears adequate for arteriovenous fistula creation  Rande Brunt. Lenell Antu, MD [facility] Phone Number: [phone] 06/09/2023 12:06 PM   Total time spent on preparing this encounter including chart review, data review, collecting history, examining the patient, coordinating care for this new patient, 60 minutes.  Portions of this report may have been transcribed using voice recognition software.  Every effort has been made to ensure accuracy; however, inadvertent computerized transcription errors  may still be present.

## 2023-06-10 ENCOUNTER — Other Ambulatory Visit: Payer: Self-pay

## 2023-06-10 ENCOUNTER — Encounter: Payer: Medicare Other | Admitting: Vascular Surgery

## 2023-06-10 DIAGNOSIS — C649 Malignant neoplasm of unspecified kidney, except renal pelvis: Secondary | ICD-10-CM

## 2023-06-10 DIAGNOSIS — C7900 Secondary malignant neoplasm of unspecified kidney and renal pelvis: Secondary | ICD-10-CM

## 2023-06-11 ENCOUNTER — Inpatient Hospital Stay: Payer: Medicare Other | Attending: Hematology

## 2023-06-11 ENCOUNTER — Telehealth: Payer: Self-pay | Admitting: *Deleted

## 2023-06-11 DIAGNOSIS — C649 Malignant neoplasm of unspecified kidney, except renal pelvis: Secondary | ICD-10-CM

## 2023-06-11 DIAGNOSIS — Z85528 Personal history of other malignant neoplasm of kidney: Secondary | ICD-10-CM | POA: Diagnosis present

## 2023-06-11 DIAGNOSIS — C7900 Secondary malignant neoplasm of unspecified kidney and renal pelvis: Secondary | ICD-10-CM

## 2023-06-11 LAB — CBC WITH DIFFERENTIAL (CANCER CENTER ONLY)
Abs Immature Granulocytes: 0.03 10*3/uL (ref 0.00–0.07)
Basophils Absolute: 0.1 10*3/uL (ref 0.0–0.1)
Basophils Relative: 1 %
Eosinophils Absolute: 0.2 10*3/uL (ref 0.0–0.5)
Eosinophils Relative: 3 %
HCT: 40.1 % (ref 39.0–52.0)
Hemoglobin: 13.2 g/dL (ref 13.0–17.0)
Immature Granulocytes: 0 %
Lymphocytes Relative: 21 %
Lymphs Abs: 1.6 10*3/uL (ref 0.7–4.0)
MCH: 33.7 pg (ref 26.0–34.0)
MCHC: 32.9 g/dL (ref 30.0–36.0)
MCV: 102.3 fL — ABNORMAL HIGH (ref 80.0–100.0)
Monocytes Absolute: 1.1 10*3/uL — ABNORMAL HIGH (ref 0.1–1.0)
Monocytes Relative: 14 %
Neutro Abs: 4.7 10*3/uL (ref 1.7–7.7)
Neutrophils Relative %: 61 %
Platelet Count: 244 10*3/uL (ref 150–400)
RBC: 3.92 MIL/uL — ABNORMAL LOW (ref 4.22–5.81)
RDW: 13.1 % (ref 11.5–15.5)
WBC Count: 7.8 10*3/uL (ref 4.0–10.5)
nRBC: 0 % (ref 0.0–0.2)

## 2023-06-11 LAB — CMP (CANCER CENTER ONLY)
ALT: 42 U/L (ref 0–44)
AST: 30 U/L (ref 15–41)
Albumin: 4 g/dL (ref 3.5–5.0)
Alkaline Phosphatase: 122 U/L (ref 38–126)
Anion gap: 10 (ref 5–15)
BUN: 74 mg/dL — ABNORMAL HIGH (ref 8–23)
CO2: 20 mmol/L — ABNORMAL LOW (ref 22–32)
Calcium: 8.9 mg/dL (ref 8.9–10.3)
Chloride: 112 mmol/L — ABNORMAL HIGH (ref 98–111)
Creatinine: 4.13 mg/dL — ABNORMAL HIGH (ref 0.61–1.24)
GFR, Estimated: 14 mL/min — ABNORMAL LOW (ref >60)
Glucose, Bld: 44 mg/dL — CL (ref 70–99)
Potassium: 3.1 mmol/L — ABNORMAL LOW (ref 3.5–5.1)
Sodium: 142 mmol/L (ref 135–145)
Total Bilirubin: 0.9 mg/dL (ref <1.2)
Total Protein: 6.6 g/dL (ref 6.5–8.1)

## 2023-06-11 LAB — FERRITIN: Ferritin: 868 ng/mL — ABNORMAL HIGH (ref 24–336)

## 2023-06-11 LAB — IRON AND IRON BINDING CAPACITY (CC-WL,HP ONLY)
Iron: 164 ug/dL (ref 45–182)
Saturation Ratios: 63 % — ABNORMAL HIGH (ref 17.9–39.5)
TIBC: 259 ug/dL (ref 250–450)
UIBC: 95 ug/dL — ABNORMAL LOW (ref 117–376)

## 2023-06-11 NOTE — Telephone Encounter (Signed)
CRITICAL VALUE STICKER  CRITICAL VALUE:Glucose 44  LM for patient that his blood sugar is low

## 2023-06-12 ENCOUNTER — Other Ambulatory Visit: Payer: Self-pay

## 2023-06-12 DIAGNOSIS — N185 Chronic kidney disease, stage 5: Secondary | ICD-10-CM

## 2023-06-15 ENCOUNTER — Ambulatory Visit (HOSPITAL_COMMUNITY)
Admission: RE | Admit: 2023-06-15 | Discharge: 2023-06-15 | Disposition: A | Discharge status: Home / Self Care | Payer: Medicare Other | Source: Ambulatory Visit | Attending: Hematology | Admitting: Hematology

## 2023-06-15 DIAGNOSIS — Z90410 Acquired total absence of pancreas: Secondary | ICD-10-CM | POA: Insufficient documentation

## 2023-06-15 DIAGNOSIS — C649 Malignant neoplasm of unspecified kidney, except renal pelvis: Secondary | ICD-10-CM | POA: Insufficient documentation

## 2023-06-15 DIAGNOSIS — Z9884 Bariatric surgery status: Secondary | ICD-10-CM | POA: Diagnosis not present

## 2023-06-15 DIAGNOSIS — K2289 Other specified disease of esophagus: Secondary | ICD-10-CM | POA: Insufficient documentation

## 2023-06-15 DIAGNOSIS — C641 Malignant neoplasm of right kidney, except renal pelvis: Secondary | ICD-10-CM | POA: Insufficient documentation

## 2023-06-15 DIAGNOSIS — Z905 Acquired absence of kidney: Secondary | ICD-10-CM | POA: Insufficient documentation

## 2023-06-15 DIAGNOSIS — M8458XD Pathological fracture in neoplastic disease, other specified site, subsequent encounter for fracture with routine healing: Secondary | ICD-10-CM | POA: Diagnosis not present

## 2023-06-15 DIAGNOSIS — Z9081 Acquired absence of spleen: Secondary | ICD-10-CM | POA: Diagnosis not present

## 2023-06-15 DIAGNOSIS — R918 Other nonspecific abnormal finding of lung field: Secondary | ICD-10-CM | POA: Diagnosis not present

## 2023-06-15 DIAGNOSIS — I700 Atherosclerosis of aorta: Secondary | ICD-10-CM | POA: Diagnosis not present

## 2023-07-09 ENCOUNTER — Telehealth: Payer: Self-pay

## 2023-07-09 NOTE — Telephone Encounter (Signed)
Received a call from patient/wife stating that they spoke with Dr. Thedore Mins to advise patient has decided not to have surgery or do hemodialysis and requested to cancel fistula creation surgery on 08/12/23.

## 2023-07-15 ENCOUNTER — Ambulatory Visit: Payer: Medicare Other | Admitting: Hematology

## 2023-07-15 ENCOUNTER — Other Ambulatory Visit: Payer: Medicare Other

## 2023-07-31 ENCOUNTER — Ambulatory Visit: Payer: Medicare Other | Admitting: Hematology

## 2023-07-31 ENCOUNTER — Other Ambulatory Visit: Payer: Medicare Other

## 2023-08-07 ENCOUNTER — Other Ambulatory Visit: Payer: Self-pay

## 2023-08-07 DIAGNOSIS — C649 Malignant neoplasm of unspecified kidney, except renal pelvis: Secondary | ICD-10-CM

## 2023-08-10 ENCOUNTER — Inpatient Hospital Stay: Payer: Medicare Other | Admitting: Hematology

## 2023-08-10 ENCOUNTER — Inpatient Hospital Stay: Payer: Medicare Other | Attending: Hematology

## 2023-08-10 ENCOUNTER — Inpatient Hospital Stay: Payer: Medicare Other

## 2023-08-10 VITALS — BP 133/72 | HR 56 | Temp 97.2°F | Wt 180.5 lb

## 2023-08-10 DIAGNOSIS — Z905 Acquired absence of kidney: Secondary | ICD-10-CM | POA: Diagnosis not present

## 2023-08-10 DIAGNOSIS — C649 Malignant neoplasm of unspecified kidney, except renal pelvis: Secondary | ICD-10-CM

## 2023-08-10 DIAGNOSIS — Z87891 Personal history of nicotine dependence: Secondary | ICD-10-CM | POA: Diagnosis not present

## 2023-08-10 DIAGNOSIS — J8410 Pulmonary fibrosis, unspecified: Secondary | ICD-10-CM | POA: Diagnosis not present

## 2023-08-10 DIAGNOSIS — N189 Chronic kidney disease, unspecified: Secondary | ICD-10-CM | POA: Diagnosis not present

## 2023-08-10 DIAGNOSIS — Z923 Personal history of irradiation: Secondary | ICD-10-CM | POA: Diagnosis not present

## 2023-08-10 DIAGNOSIS — Z85528 Personal history of other malignant neoplasm of kidney: Secondary | ICD-10-CM | POA: Insufficient documentation

## 2023-08-10 DIAGNOSIS — M069 Rheumatoid arthritis, unspecified: Secondary | ICD-10-CM | POA: Diagnosis not present

## 2023-08-10 DIAGNOSIS — Z9049 Acquired absence of other specified parts of digestive tract: Secondary | ICD-10-CM | POA: Diagnosis not present

## 2023-08-10 DIAGNOSIS — R7989 Other specified abnormal findings of blood chemistry: Secondary | ICD-10-CM | POA: Insufficient documentation

## 2023-08-10 DIAGNOSIS — E1122 Type 2 diabetes mellitus with diabetic chronic kidney disease: Secondary | ICD-10-CM | POA: Diagnosis not present

## 2023-08-10 LAB — CBC WITH DIFFERENTIAL (CANCER CENTER ONLY)
Abs Immature Granulocytes: 0.01 10*3/uL (ref 0.00–0.07)
Basophils Absolute: 0.1 10*3/uL (ref 0.0–0.1)
Basophils Relative: 1 %
Eosinophils Absolute: 0.7 10*3/uL — ABNORMAL HIGH (ref 0.0–0.5)
Eosinophils Relative: 10 %
HCT: 41.2 % (ref 39.0–52.0)
Hemoglobin: 13.9 g/dL (ref 13.0–17.0)
Immature Granulocytes: 0 %
Lymphocytes Relative: 23 %
Lymphs Abs: 1.7 10*3/uL (ref 0.7–4.0)
MCH: 33.6 pg (ref 26.0–34.0)
MCHC: 33.7 g/dL (ref 30.0–36.0)
MCV: 99.5 fL (ref 80.0–100.0)
Monocytes Absolute: 1.1 10*3/uL — ABNORMAL HIGH (ref 0.1–1.0)
Monocytes Relative: 16 %
Neutro Abs: 3.7 10*3/uL (ref 1.7–7.7)
Neutrophils Relative %: 50 %
Platelet Count: 190 10*3/uL (ref 150–400)
RBC: 4.14 MIL/uL — ABNORMAL LOW (ref 4.22–5.81)
RDW: 14.7 % (ref 11.5–15.5)
WBC Count: 7.2 10*3/uL (ref 4.0–10.5)
nRBC: 0 % (ref 0.0–0.2)

## 2023-08-10 LAB — IRON AND IRON BINDING CAPACITY (CC-WL,HP ONLY)
Iron: 95 ug/dL (ref 45–182)
Saturation Ratios: 42 % — ABNORMAL HIGH (ref 17.9–39.5)
TIBC: 228 ug/dL — ABNORMAL LOW (ref 250–450)
UIBC: 133 ug/dL (ref 117–376)

## 2023-08-10 LAB — CMP (CANCER CENTER ONLY)
ALT: 33 U/L (ref 0–44)
AST: 32 U/L (ref 15–41)
Albumin: 4.1 g/dL (ref 3.5–5.0)
Alkaline Phosphatase: 158 U/L — ABNORMAL HIGH (ref 38–126)
Anion gap: 10 (ref 5–15)
BUN: 74 mg/dL — ABNORMAL HIGH (ref 8–23)
CO2: 18 mmol/L — ABNORMAL LOW (ref 22–32)
Calcium: 8.6 mg/dL — ABNORMAL LOW (ref 8.9–10.3)
Chloride: 114 mmol/L — ABNORMAL HIGH (ref 98–111)
Creatinine: 3.8 mg/dL — ABNORMAL HIGH (ref 0.61–1.24)
GFR, Estimated: 16 mL/min — ABNORMAL LOW (ref >60)
Glucose, Bld: 117 mg/dL — ABNORMAL HIGH (ref 70–99)
Potassium: 3.1 mmol/L — ABNORMAL LOW (ref 3.5–5.1)
Sodium: 142 mmol/L (ref 135–145)
Total Bilirubin: 0.5 mg/dL (ref 0.0–1.2)
Total Protein: 6.7 g/dL (ref 6.5–8.1)

## 2023-08-10 LAB — FERRITIN: Ferritin: 800 ng/mL — ABNORMAL HIGH (ref 24–336)

## 2023-08-11 ENCOUNTER — Telehealth: Payer: Self-pay | Admitting: Hematology

## 2023-08-11 NOTE — Telephone Encounter (Signed)
 Spoke with patient confirming upcoming appointment

## 2023-08-12 ENCOUNTER — Ambulatory Visit (HOSPITAL_COMMUNITY): Admit: 2023-08-12 | Payer: Medicare Other | Admitting: Vascular Surgery

## 2023-08-12 SURGERY — ARTERIOVENOUS (AV) FISTULA CREATION
Anesthesia: Monitor Anesthesia Care | Laterality: Left

## 2023-08-17 ENCOUNTER — Encounter: Payer: Self-pay | Admitting: Hematology

## 2023-08-17 NOTE — Progress Notes (Signed)
 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 2) Remote history of metastatic renal cell carcinoma Diagnosed with renal cell carcinoma 20 years ago and had a right nephrectomy. About 8 years after that he was noted to have abdominal recurrence in his pancreas spleen and small intestine and had a Whipple's procedure and significant abdominal surgery and notes that 10 out of 17 lymph nodes were positive. He also had his gallbladder removed. Postoperative course was complicated by an internal hemorrhage as per his report. Patient notes 2-3 years after that he had recurrence in his thyroid  that led to a thyroidectomy. [June 2004] later he had partial gastrectomy for local recurrence. [February 2005] when he presented with GI bleeding. Patient notes that he has had no known evidence of recurrence over the last 8 years until his recent CT scan showed lung nodules.  Current Treatment: Active Surveillance (Sutent  on hold now from 10/2020) Previous treatment Sutent  on cycle 1 - 50 mg by mouth daily for 4 weeks on and 2-weeks off. It was dose adjusted to 37.5 mg by mouth daily for 2 weeks with 1 week of to help mitigate issues with fatigue, mild hyperbilirubinemia, cytopenias. SBRT to dominant RML nodule.   INTERVAL HISTORY: Dylan Jefferson is a 73 y.o. 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 He has had significant joint pain issues with his RA arthritis. His renal function has been worsening gradually. He notes that he had decided against considering HD and  therefore AVF placement on 07/08/2024 was cancelled. CT CAP from 06/15/23 was discussed and showed some increase in some lung lesions which are indeterminate. No new cough, chest pain or shortness of breath.  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, Ultrasound positive synovitis    Kidney disease    Macular degeneration    Bilateral eyes   Nephrolithiasis    Osteoarthritis of both hands 05/28/2016   Osteoarthritis of both knees 05/28/2016   Peripheral vascular disorder (HCC) 05/28/2016   Peyronie's syndrome    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   Rheumatoid arthritis (HCC)    Thyroid  cancer (HCC) 05/28/2016   Past Surgical History:  Procedure Laterality Date   CATARACT EXTRACTION Bilateral 2013   CHOLECYSTECTOMY     CYSTOSCOPY/URETEROSCOPY/HOLMIUM LASER/STENT PLACEMENT Left 03/31/2022   Procedure: CYSTOSCOPY/ RETROGRADE/URETEROSCOPY/HOLMIUM LASER/STENT PLACEMENT;  Surgeon: Renda Glance, MD;  Location: [facility];  Service: Urology;  Laterality: Left;   GASTRECTOMY     tumor removed   GLAUCOMA SURGERY     Laser surgery   IR US  GUIDE VASC ACCESS RIGHT  08/22/2021   IR VENOGRAM HEPATIC W HEMODYNAMIC EVALUATION  08/22/2021   LIPOMA EXCISION Right 1999   NEPHRECTOMY Right    PANCREATECTOMY     SPLENECTOMY     THYROIDECTOMY  URETERAL STENT PLACEMENT Left 2012   WHIPPLE PROCEDURE     Social History   Tobacco Use   Smoking status: Former    Types: Cigarettes   Smokeless tobacco: Never  Vaping Use   Vaping status: Never Used  Substance Use Topics   Alcohol use: Yes    Comment: occasional beer   Drug use: No    ALLERGIES:  is allergic to  carvedilol , iodine, and prozac [fluoxetine hcl].  MEDICATIONS:  Current Outpatient Medications  Medication Sig Dispense Refill   aspirin  81 MG tablet Take 81 mg by mouth daily.     augmented betamethasone dipropionate (DIPROLENE-AF) 0.05 % cream Apply 1 application topically daily as needed (rash).   0   azelastine (OPTIVAR) 0.05 % ophthalmic solution Place 1 drop into both eyes in the morning and at bedtime.     calcium  citrate-vitamin D  (CITRACAL+D) 315-200 MG-UNIT per tablet Take 1 tablet by mouth 2 (two) times daily.     Cholecalciferol (VITAMIN D3) 50 MCG (2000 UT) TABS Take 2,000 Units by mouth daily. 2 Tablets daily     Cyanocobalamin  (VITAMIN B-12) 2500 MCG SUBL Take 2,500 mcg by mouth daily.     diclofenac  Sodium (VOLTAREN ) 1 % GEL Apply 3 g topically 3 (three) times daily as needed (joint pain).      dicyclomine  (BENTYL ) 10 MG capsule Take 1 capsule (10 mg total) by mouth every 8 (eight) hours as needed for spasms. 30 capsule 1   docusate sodium (COLACE) 100 MG capsule Take 100 mg by mouth 2 (two) times daily as needed for mild constipation.      dorzolamide -timolol  (COSOPT ) 22.3-6.8 MG/ML ophthalmic solution Place 1 drop into both eyes 2 (two) times daily.   1   famotidine  (PEPCID ) 20 MG tablet Take 20 mg by mouth 3 (three) times daily.     gabapentin (NEURONTIN) 300 MG capsule Take 300 mg by mouth daily as needed (pain).     Glucosamine-MSM-Hyaluronic Acd 750-375-30 MG TABS Take 2 tablets by mouth daily.     hydrALAZINE  (APRESOLINE ) 50 MG tablet Take 50 mg by mouth 3 (three) times daily.     HYDROcodone-acetaminophen  (NORCO/VICODIN) 5-325 MG tablet Take 1 tablet by mouth every 6 (six) hours as needed for severe pain.     insulin  lispro (HUMALOG) 100 UNIT/ML injection Inject 2-8 Units into the skin 4 (four) times daily -  with meals and at bedtime. Per sliding scale     Lactobacillus (ACIDOPHILUS) CAPS capsule Take 1 capsule by mouth daily.     latanoprost  (XALATAN ) 0.005 %  ophthalmic solution Place 1 drop into both eyes at bedtime.   1   levothyroxine  (SYNTHROID ) 125 MCG tablet Take 250 mcg by mouth daily before breakfast.     lovastatin (MEVACOR) 40 MG tablet Take 40 mg by mouth at bedtime.      Multiple Vitamins-Minerals (PRESERVISION AREDS 2) CAPS Take 1 capsule by mouth 2 (two) times daily.     Omega-3 Fatty Acids (FISH OIL) 1200 MG CAPS Take 1 capsule by mouth daily.     omeprazole  (PRILOSEC) 20 MG capsule Take 1 capsule (20 mg total) by mouth daily. (Patient taking differently: Take 20 mg by mouth at bedtime as needed (acid reflux).) 30 capsule 3   Pancrelipase , Lip-Prot-Amyl, 6000-19000 units CPEP Take 4 capsules by mouth 3 (three) times daily.     sildenafil (VIAGRA) 100 MG tablet Take 100 mg by mouth daily as needed for erectile dysfunction.     torsemide  (DEMADEX )  20 MG tablet Take 40 mg by mouth in the morning, at noon, and at bedtime.     traMADol  (ULTRAM ) 50 MG tablet Take 50 mg by mouth 2 (two) times daily.     TRESIBA 100 UNIT/ML SOLN Inject 8 Units into the skin 2 (two) times daily. Take 8 Units q am and 10 units q pm     atenolol  (TENORMIN ) 50 MG tablet Take 50 mg by mouth daily. (Patient not taking: Reported on 06/09/2023)     LEVEMIR  100 UNIT/ML injection Inject 8-10 Units into the skin See admin instructions. Inject 8 units into the skin in the morning and 10 units at night (Patient not taking: Reported on 08/10/2023)  1   losartan (COZAAR) 25 MG tablet Take 25 mg by mouth daily. (Patient not taking: Reported on 08/10/2023)     No current facility-administered medications for this visit.    REVIEW OF SYSTEMS:   10 Point review of Systems was done is negative except as noted above.   PHYSICAL EXAMINATION: ECOG FS:1 - Symptomatic but completely ambulatory  Vitals:   08/10/23 1428  BP: 133/72  Pulse: (!) 56  Temp: (!) 97.2 F (36.2 C)  SpO2: 98%    Wt Readings from Last 3 Encounters:  08/10/23 180 lb 8 oz (81.9 kg)  06/09/23 181 lb 14.4  oz (82.5 kg)  03/12/23 188 lb (85.3 kg)   Body mass index is 25.9 kg/m.      SABRA GENERAL:alert, in no acute distress and comfortable SKIN: no acute rashes, no significant lesions EYES: conjunctiva are pink and non-injected, sclera anicteric OROPHARYNX: MMM, no exudates, no oropharyngeal erythema or ulceration NECK: supple, no JVD LYMPH:  no palpable lymphadenopathy in the cervical, axillary or inguinal regions LUNGS: clear to auscultation b/l with normal respiratory effort HEART: regular rate & rhythm ABDOMEN:  normoactive bowel sounds , non tender, not distended. Extremity: no pedal edema PSYCH: alert & oriented x 3 with fluent speech NEURO: no focal motor/sensory deficits    LABORATORY DATA:  I have reviewed the data as listed .    Latest Ref Rng & Units 08/10/2023    1:11 PM 06/11/2023    7:44 AM 04/01/2023    7:33 AM  CBC  WBC 4.0 - 10.5 K/uL 7.2  7.8  7.8   Hemoglobin 13.0 - 17.0 g/dL 86.0  86.7  86.4   Hematocrit 39.0 - 52.0 % 41.2  40.1  41.8   Platelets 150 - 400 K/uL 190  244  208     CBC    Component Value Date/Time   WBC 7.2 08/10/2023 1311   WBC 8.0 07/31/2022 1556   RBC 4.14 (L) 08/10/2023 1311   HGB 13.9 08/10/2023 1311   HGB 14.0 06/11/2017 0808   HCT 41.2 08/10/2023 1311   HCT 40.7 06/11/2017 0808   PLT 190 08/10/2023 1311   PLT 223 06/11/2017 0808   MCV 99.5 08/10/2023 1311   MCV 112.1 (H) 06/11/2017 0808   MCH 33.6 08/10/2023 1311   MCHC 33.7 08/10/2023 1311   RDW 14.7 08/10/2023 1311   RDW 13.9 06/11/2017 0808   LYMPHSABS 1.7 08/10/2023 1311   LYMPHSABS 1.6 06/11/2017 0808   MONOABS 1.1 (H) 08/10/2023 1311   MONOABS 0.5 06/11/2017 0808   EOSABS 0.7 (H) 08/10/2023 1311   EOSABS 0.1 06/11/2017 0808   BASOSABS 0.1 08/10/2023 1311   BASOSABS 0.0 06/11/2017 0808   Lab Results  Component Value Date   IRON 95 08/10/2023   TIBC  228 (L) 08/10/2023   FERRITIN 800 (H) 08/10/2023       Latest Ref Rng & Units 08/10/2023    1:11 PM 06/11/2023     7:44 AM 04/01/2023    7:33 AM  CMP  Glucose 70 - 99 mg/dL 882  44  658   BUN 8 - 23 mg/dL 74  74  77   Creatinine 0.61 - 1.24 mg/dL 6.19  5.86  5.81   Sodium 135 - 145 mmol/L 142  142  137   Potassium 3.5 - 5.1 mmol/L 3.1  3.1  3.6   Chloride 98 - 111 mmol/L 114  112  107   CO2 22 - 32 mmol/L 18  20  20    Calcium  8.9 - 10.3 mg/dL 8.6  8.9  8.0   Total Protein 6.5 - 8.1 g/dL 6.7  6.6  6.1   Total Bilirubin 0.0 - 1.2 mg/dL 0.5  0.9  0.5   Alkaline Phos 38 - 126 U/L 158  122  180   AST 15 - 41 U/L 32  30  33   ALT 0 - 44 U/L 33  42  41    08/18/2019 CT Abdomen Pelvis Wo Contrast (Accession [id number])   08/18/2019 CT Chest Wo Contrast (Accession [id number])   RADIOGRAPHIC STUDIES: I have personally reviewed the radiological images as listed and agreed with the findings in the report. No results found.    ASSESSMENT & PLAN:   73 y.o. Caucasian male with  #1 Recurrent metastatic Renal cell carcinoma with multiple lung metastases. Has been on stable on 1st line Sutent  for >62yr. Has had SBRT to the dominant right lung nodule with partial response. No lab or clinical evidence of metastatic RCC progression at this time.  PET/CT 08/08/2016 with a couple of mildly enlarge LLL pulmonary nodules - no other overt evidence of RCC progression at this time.  PET CT scan 11/06/2016 - no overt evidence of RCC progression at this time .  PET/CT 03/13/2017 - no overt evidence of RCC progression at this time .  PET/CT 08/10/2017 - no overt evidence of RCC progression at this time .  PET/ CT done 08/10/2017, shows no evidence of  Progression at this time.  12/07/17 PET which revealed No findings suspicious for recurrent or metastatic disease.  03/22/18 PET/CT revealed One of the left lower lobe nodules has very minimally enlarged over the last 2 years, currently 0.6 by 0.4 cm and measuring 0.4 by 0.3 cm on 03/20/2016. This probably represents a previously treated metastatic nodule given that it measured  1.0 by 0.8 cm on 06/06/2015. This nodule may be subject to some slice selection phenomenon and also motion artifact given proximity to the diaphragm. Surveillance is likely warranted. 2. Chronic central lucency, marginal bony deposition, and slow healing of the right lateral fourth rib fracture. Possibilities may include underlying radiation necrosis as a cause for fracture leading to slow healing, or a low-grade pathologic lesion. Maximum SUV at this fracture site is 2.9, previously 2.4. 3. 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 There is only low-grade activity in this vicinity characteristic of radiation fibrosis, without focal activity. 4. Other imaging findings of potential clinical significance: Aortic Atherosclerosis. Coronary atherosclerosis. Mild cardiomegaly. Right nephrectomy. 8 mm nonobstructive calculus in the left kidney lower pole.   08/24/18 CT C/A/P revealed Stable small left lower lobe pulmonary nodules from recent prior studies. As previously noted,  these have decreased in size from PET-CT 06/06/2015, likely treated metastases. No new or enlarging pulmonary nodules. 2. Postsurgical changes in the upper abdomen without evidence of local recurrence or metastatic disease. 3. Nonobstructing left renal calculus. 4. Stable radiation changes in the right upper lobe with probable chronic pathologic fracture of the right 4th rib laterally.  03/07/2019 CT C/A wo contrast revealed 1. Stable left lower lobe pulmonary nodules, likely treated Metastases. 2. No additional evidence of metastatic disease. 3. Left renal stone. 4. Post radiation scarring in the right hemithorax with a healed adjacent right rib fracture which is presumably pathologic in Etiology. 5.  Aortic atherosclerosis (ICD10-170.0). 6. Enlarged pulmonic trunk, indicative of pulmonary arterial Hypertension.  08/14/22 CT CAP: Extensive surgical changes in the  abdomen from right nephrectomy, splenectomy and presumed Whipple procedure. No abnormal soft tissue or lymph node enlargement seen in the abdomen and pelvis including towards the surgical bed. Multiple small lung nodules. These are similar in number but several have slightly increased in size. Stable fracture involving the lateral aspect of the right fourth rib with some irregularity. Please correlate for a metastatic lesion.  10/24/22 NM PET: Status post left nephrectomy. Small bilateral pulmonary nodules are non FDG avid but beneath the size threshold for PET sensitivity. Continued attention on follow-up is suggested. Otherwise, no findings suspicious for metastatic disease. Additional stable ancillary and postsurgical findings, as above. Aortic Atherosclerosis (ICD10-I70.0).  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 Dolphus for mx. Not currently on any medications  #6 DM2 -continue f/u with Dr Yolande for continue mx, blood sugar at home per patient.   #7Postsplenectomy status  #8 abnormal liver function tests.  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 LFTs WNL today PLAN: - labs done today were reviewed in details -cbc stable -CMP with gradually increasing creatinine. -ferritin 800 with iron saturation of 42% -CT CAP discussed. Some  Scattered solid bilateral pulmonary nodules some of which is increased in size others of which are stable in size, concerning for progressive metastatic disease. --pet/ct to further characterize lung nodules. -Advised patient to stay well hydrated. -patient is a challenging candidate for RCC Treatment. TKI likely to affect renal function and previously caused nephrotic syndrome and  immunotherapy is likely to worsen his RA but is not currently controlled. -holding therapeutic phlebotomy today per patient preference.  FOLLOW-UP: Pet/ct in 4 weeks RTC with Dr Onesimo with labs and appointment for therapeutic phlebotomy in 6 weeks    .The total time spent in the appointment was 30 minutes* .  All of the patient's questions were answered with apparent satisfaction. The patient knows to call the clinic with any problems, questions or concerns.   Emaline Onesimo MD MS AAHIVMS [facility] Hematology/Oncology Physician [facility]  .*Total Encounter Time as defined by the Centers for Medicare and Medicaid Services includes, in addition to the face-to-face time of a patient visit (documented in the note above) non-face-to-face time: obtaining and reviewing outside history, ordering and reviewing medications, tests or procedures, care coordination (communications with other health care professionals or caregivers) and documentation in the medical record.

## 2023-08-26 ENCOUNTER — Ambulatory Visit: Payer: Medicare Other | Admitting: Skilled Nursing Facility1

## 2023-08-26 ENCOUNTER — Other Ambulatory Visit: Payer: Self-pay | Admitting: Hematology

## 2023-08-26 DIAGNOSIS — C649 Malignant neoplasm of unspecified kidney, except renal pelvis: Secondary | ICD-10-CM

## 2023-08-26 NOTE — Progress Notes (Signed)
Pet ct ordered

## 2023-09-03 ENCOUNTER — Encounter (HOSPITAL_COMMUNITY)
Admission: RE | Admit: 2023-09-03 | Discharge: 2023-09-03 | Disposition: A | Discharge status: Home / Self Care | Payer: Medicare Other | Source: Ambulatory Visit | Attending: Hematology | Admitting: Hematology

## 2023-09-03 DIAGNOSIS — C649 Malignant neoplasm of unspecified kidney, except renal pelvis: Secondary | ICD-10-CM | POA: Insufficient documentation

## 2023-09-03 LAB — GLUCOSE, CAPILLARY: Glucose-Capillary: 80 mg/dL (ref 70–99)

## 2023-09-03 MED ORDER — FLUDEOXYGLUCOSE F - 18 (FDG) INJECTION
9.0000 | Freq: Once | INTRAVENOUS | Status: AC | PRN
Start: 2023-09-03 — End: 2023-09-03
  Administered 2023-09-03: 9 via INTRAVENOUS

## 2023-09-21 ENCOUNTER — Other Ambulatory Visit: Payer: Self-pay

## 2023-09-21 DIAGNOSIS — C649 Malignant neoplasm of unspecified kidney, except renal pelvis: Secondary | ICD-10-CM

## 2023-09-22 ENCOUNTER — Inpatient Hospital Stay: Payer: Medicare Other

## 2023-09-22 ENCOUNTER — Inpatient Hospital Stay: Payer: Medicare Other | Admitting: Hematology

## 2023-09-22 ENCOUNTER — Other Ambulatory Visit: Payer: Self-pay

## 2023-09-22 ENCOUNTER — Inpatient Hospital Stay: Payer: Medicare Other | Attending: Hematology

## 2023-09-22 ENCOUNTER — Other Ambulatory Visit: Payer: Self-pay | Admitting: Hematology

## 2023-09-22 VITALS — BP 132/61 | HR 74 | Temp 98.1°F | Resp 18 | Wt 187.2 lb

## 2023-09-22 DIAGNOSIS — Z9081 Acquired absence of spleen: Secondary | ICD-10-CM | POA: Diagnosis not present

## 2023-09-22 DIAGNOSIS — C649 Malignant neoplasm of unspecified kidney, except renal pelvis: Secondary | ICD-10-CM | POA: Diagnosis not present

## 2023-09-22 DIAGNOSIS — R748 Abnormal levels of other serum enzymes: Secondary | ICD-10-CM | POA: Insufficient documentation

## 2023-09-22 DIAGNOSIS — R918 Other nonspecific abnormal finding of lung field: Secondary | ICD-10-CM | POA: Insufficient documentation

## 2023-09-22 DIAGNOSIS — N189 Chronic kidney disease, unspecified: Secondary | ICD-10-CM | POA: Diagnosis not present

## 2023-09-22 DIAGNOSIS — Z85528 Personal history of other malignant neoplasm of kidney: Secondary | ICD-10-CM | POA: Diagnosis not present

## 2023-09-22 DIAGNOSIS — R7989 Other specified abnormal findings of blood chemistry: Secondary | ICD-10-CM | POA: Insufficient documentation

## 2023-09-22 DIAGNOSIS — M069 Rheumatoid arthritis, unspecified: Secondary | ICD-10-CM | POA: Insufficient documentation

## 2023-09-22 DIAGNOSIS — Z87891 Personal history of nicotine dependence: Secondary | ICD-10-CM | POA: Insufficient documentation

## 2023-09-22 DIAGNOSIS — E1122 Type 2 diabetes mellitus with diabetic chronic kidney disease: Secondary | ICD-10-CM | POA: Diagnosis not present

## 2023-09-22 DIAGNOSIS — E1165 Type 2 diabetes mellitus with hyperglycemia: Secondary | ICD-10-CM | POA: Diagnosis not present

## 2023-09-22 DIAGNOSIS — Z905 Acquired absence of kidney: Secondary | ICD-10-CM | POA: Diagnosis not present

## 2023-09-22 DIAGNOSIS — R944 Abnormal results of kidney function studies: Secondary | ICD-10-CM | POA: Insufficient documentation

## 2023-09-22 LAB — CMP (CANCER CENTER ONLY)
ALT: 35 U/L (ref 0–44)
AST: 32 U/L (ref 15–41)
Albumin: 3.9 g/dL (ref 3.5–5.0)
Alkaline Phosphatase: 147 U/L — ABNORMAL HIGH (ref 38–126)
Anion gap: 8 (ref 5–15)
BUN: 39 mg/dL — ABNORMAL HIGH (ref 8–23)
CO2: 16 mmol/L — ABNORMAL LOW (ref 22–32)
Calcium: 8 mg/dL — ABNORMAL LOW (ref 8.9–10.3)
Chloride: 115 mmol/L — ABNORMAL HIGH (ref 98–111)
Creatinine: 3.26 mg/dL — ABNORMAL HIGH (ref 0.61–1.24)
GFR, Estimated: 19 mL/min — ABNORMAL LOW (ref >60)
Glucose, Bld: 318 mg/dL — ABNORMAL HIGH (ref 70–99)
Potassium: 3.6 mmol/L (ref 3.5–5.1)
Sodium: 139 mmol/L (ref 135–145)
Total Bilirubin: 0.6 mg/dL (ref 0.0–1.2)
Total Protein: 6 g/dL — ABNORMAL LOW (ref 6.5–8.1)

## 2023-09-22 LAB — CBC WITH DIFFERENTIAL (CANCER CENTER ONLY)
Abs Immature Granulocytes: 0.01 10*3/uL (ref 0.00–0.07)
Basophils Absolute: 0.1 10*3/uL (ref 0.0–0.1)
Basophils Relative: 1 %
Eosinophils Absolute: 0.2 10*3/uL (ref 0.0–0.5)
Eosinophils Relative: 3 %
HCT: 42.4 % (ref 39.0–52.0)
Hemoglobin: 13.7 g/dL (ref 13.0–17.0)
Immature Granulocytes: 0 %
Lymphocytes Relative: 27 %
Lymphs Abs: 2 10*3/uL (ref 0.7–4.0)
MCH: 33.1 pg (ref 26.0–34.0)
MCHC: 32.3 g/dL (ref 30.0–36.0)
MCV: 102.4 fL — ABNORMAL HIGH (ref 80.0–100.0)
Monocytes Absolute: 1 10*3/uL (ref 0.1–1.0)
Monocytes Relative: 14 %
Neutro Abs: 4.3 10*3/uL (ref 1.7–7.7)
Neutrophils Relative %: 55 %
Platelet Count: 193 10*3/uL (ref 150–400)
RBC: 4.14 MIL/uL — ABNORMAL LOW (ref 4.22–5.81)
RDW: 14.5 % (ref 11.5–15.5)
WBC Count: 7.6 10*3/uL (ref 4.0–10.5)
nRBC: 0 % (ref 0.0–0.2)

## 2023-09-22 LAB — IRON AND IRON BINDING CAPACITY (CC-WL,HP ONLY)
Iron: 158 ug/dL (ref 45–182)
Saturation Ratios: 72 % — ABNORMAL HIGH (ref 17.9–39.5)
TIBC: 220 ug/dL — ABNORMAL LOW (ref 250–450)
UIBC: 62 ug/dL — ABNORMAL LOW (ref 117–376)

## 2023-09-22 LAB — FERRITIN: Ferritin: 947 ng/mL — ABNORMAL HIGH (ref 24–336)

## 2023-09-22 NOTE — Progress Notes (Signed)
 Per Dr Candise Che, parameters for phlebotomy to remove 350 g.  Dylan Jefferson presents today for phlebotomy per MD orders. Phlebotomy procedure started at 1548 and ended at 1552. 414 grams removed. 16 ga phlebotomy kit used to L AC, intact upon removal. Patient tolerated procedure well. Patient provided personal beverage, pt declined snack.

## 2023-09-22 NOTE — Progress Notes (Signed)
 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 2) Remote history of metastatic renal cell carcinoma Diagnosed with renal cell carcinoma 20 years ago and had a right nephrectomy. About 8 years after that he was noted to have abdominal recurrence in his pancreas spleen and small intestine and had a Whipple's procedure and significant abdominal surgery and notes that 10 out of 17 lymph nodes were positive. He also had his gallbladder removed. Postoperative course was complicated by an internal hemorrhage as per his report. Patient notes 2-3 years after that he had recurrence in his thyroid that led to a thyroidectomy. [June 2004] later he had partial gastrectomy for local recurrence. [February 2005] when he presented with GI bleeding. Patient notes that he has had no known evidence of recurrence over the last 8 years until his recent CT scan showed lung nodules.  Current Treatment: Active Surveillance (Sutent on hold now from 10/2020) Previous treatment Sutent on cycle 1 - 50 mg by mouth daily for 4 weeks on and 2-weeks off. It was dose adjusted to 37.5 mg by mouth daily for 2 weeks with 1 week of to help mitigate issues with fatigue, mild hyperbilirubinemia, cytopenias. SBRT to dominant RML nodule.   INTERVAL HISTORY: Dylan Jefferson is a 73 y.o. 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 He has had significant joint pain issues with his RA arthritis.  Patient was last seen by me on 08/10/2023 and he was doing well overall.   Patient is accompanied by his  wife during this visit. Patient notes he has been doing well overall since our last visit. He denies any new infection issues, fever, chills, night sweats, unexpected weight loss, back pain, chest pain, or leg swelling.   Patient notes that his pain related to rheumatoid arthritis is well-controlled.  He notes he has been eating well and stays well-hydrated.   Patient notes he tolerated his previous phlebotomy well without any new or severe toxicities.   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, Ultrasound positive synovitis    Kidney disease    Macular degeneration    Bilateral eyes   Nephrolithiasis    Osteoarthritis of both hands 05/28/2016   Osteoarthritis of both knees 05/28/2016   Peripheral vascular disorder (HCC) 05/28/2016   Peyronie's syndrome    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   Rheumatoid arthritis (HCC)    Thyroid cancer (HCC) 05/28/2016   Past Surgical History:  Procedure Laterality Date   CATARACT EXTRACTION Bilateral 2013   CHOLECYSTECTOMY     CYSTOSCOPY/URETEROSCOPY/HOLMIUM LASER/STENT PLACEMENT Left 03/31/2022   Procedure: CYSTOSCOPY/ RETROGRADE/URETEROSCOPY/HOLMIUM LASER/STENT PLACEMENT;  Surgeon: Heloise Purpura, MD;  Location: [facility];  Service: Urology;  Laterality: Left;   GASTRECTOMY     tumor removed   GLAUCOMA SURGERY  Laser surgery   IR US GUIDE VASC ACCESS RIGHT  08/22/2021   IR VENOGRAM HEPATIC W HEMODYNAMIC EVALUATION  08/22/2021   LIPOMA EXCISION Right 1999   NEPHRECTOMY Right    PANCREATECTOMY     SPLENECTOMY     THYROIDECTOMY     URETERAL STENT PLACEMENT Left 2012   WHIPPLE PROCEDURE     Social History    Tobacco Use   Smoking status: Former    Types: Cigarettes   Smokeless tobacco: Never  Vaping Use   Vaping status: Never Used  Substance Use Topics   Alcohol use: Yes    Comment: occasional beer   Drug use: No    ALLERGIES:  is allergic to carvedilol, iodine, and prozac [fluoxetine hcl].  MEDICATIONS:  Current Outpatient Medications  Medication Sig Dispense Refill   aspirin 81 MG tablet Take 81 mg by mouth daily.     atenolol (TENORMIN) 50 MG tablet Take 50 mg by mouth daily. (Patient not taking: Reported on 06/09/2023)     augmented betamethasone dipropionate (DIPROLENE-AF) 0.05 % cream Apply 1 application topically daily as needed (rash).   0   azelastine (OPTIVAR) 0.05 % ophthalmic solution Place 1 drop into both eyes in the morning and at bedtime.     calcium citrate-vitamin D (CITRACAL+D) 315-200 MG-UNIT per tablet Take 1 tablet by mouth 2 (two) times daily.     Cholecalciferol (VITAMIN D3) 50 MCG (2000 UT) TABS Take 2,000 Units by mouth daily. 2 Tablets daily     Cyanocobalamin (VITAMIN B-12) 2500 MCG SUBL Take 2,500 mcg by mouth daily.     diclofenac Sodium (VOLTAREN) 1 % GEL Apply 3 g topically 3 (three) times daily as needed (joint pain).      dicyclomine (BENTYL) 10 MG capsule Take 1 capsule (10 mg total) by mouth every 8 (eight) hours as needed for spasms. 30 capsule 1   docusate sodium (COLACE) 100 MG capsule Take 100 mg by mouth 2 (two) times daily as needed for mild constipation.      dorzolamide-timolol (COSOPT) 22.3-6.8 MG/ML ophthalmic solution Place 1 drop into both eyes 2 (two) times daily.   1   famotidine (PEPCID) 20 MG tablet Take 20 mg by mouth 3 (three) times daily.     gabapentin (NEURONTIN) 300 MG capsule Take 300 mg by mouth daily as needed (pain).     Glucosamine-MSM-Hyaluronic Acd 750-375-30 MG TABS Take 2 tablets by mouth daily.     hydrALAZINE (APRESOLINE) 50 MG tablet Take 50 mg by mouth 3 (three) times daily.     HYDROcodone-acetaminophen  (NORCO/VICODIN) 5-325 MG tablet Take 1 tablet by mouth every 6 (six) hours as needed for severe pain.     insulin lispro (HUMALOG) 100 UNIT/ML injection Inject 2-8 Units into the skin 4 (four) times daily -  with meals and at bedtime. Per sliding scale     Lactobacillus (ACIDOPHILUS) CAPS capsule Take 1 capsule by mouth daily.     latanoprost (XALATAN) 0.005 % ophthalmic solution Place 1 drop into both eyes at bedtime.   1   LEVEMIR 100 UNIT/ML injection Inject 8-10 Units into the skin See admin instructions. Inject 8 units into the skin in the morning and 10 units at night (Patient not taking: Reported on 08/10/2023)  1   levothyroxine (SYNTHROID) 125 MCG tablet Take 250 mcg by mouth daily before breakfast.     losartan (COZAAR) 25 MG tablet Take 25 mg by mouth daily. (Patient not taking: Reported on 08/10/2023)  lovastatin (MEVACOR) 40 MG tablet Take 40 mg by mouth at bedtime.      Multiple Vitamins-Minerals (PRESERVISION AREDS 2) CAPS Take 1 capsule by mouth 2 (two) times daily.     Omega-3 Fatty Acids (FISH OIL) 1200 MG CAPS Take 1 capsule by mouth daily.     omeprazole (PRILOSEC) 20 MG capsule Take 1 capsule (20 mg total) by mouth daily. (Patient taking differently: Take 20 mg by mouth at bedtime as needed (acid reflux).) 30 capsule 3   Pancrelipase, Lip-Prot-Amyl, 6000-19000 units CPEP Take 4 capsules by mouth 3 (three) times daily.     sildenafil (VIAGRA) 100 MG tablet Take 100 mg by mouth daily as needed for erectile dysfunction.     torsemide (DEMADEX) 20 MG tablet Take 40 mg by mouth in the morning, at noon, and at bedtime.     traMADol (ULTRAM) 50 MG tablet Take 50 mg by mouth 2 (two) times daily.     TRESIBA 100 UNIT/ML SOLN Inject 8 Units into the skin 2 (two) times daily. Take 8 Units q am and 10 units q pm     No current facility-administered medications for this visit.    REVIEW OF SYSTEMS:   10 Point review of Systems was done is negative except as noted above.   PHYSICAL  EXAMINATION: ECOG FS:1 - Symptomatic but completely ambulatory  Vitals:   09/22/23 1400  BP: 132/61  Pulse: 74  Resp: 18  Temp: 98.1 F (36.7 C)  SpO2: 97%     Wt Readings from Last 3 Encounters:  08/10/23 180 lb 8 oz (81.9 kg)  06/09/23 181 lb 14.4 oz (82.5 kg)  03/12/23 188 lb (85.3 kg)   Body mass index is 26.86 kg/m.     GENERAL:alert, in no acute distress and comfortable SKIN: no acute rashes, no significant lesions EYES: conjunctiva are pink and non-injected, sclera anicteric OROPHARYNX: MMM, no exudates, no oropharyngeal erythema or ulceration NECK: supple, no JVD LYMPH:  no palpable lymphadenopathy in the cervical, axillary or inguinal regions LUNGS: clear to auscultation b/l with normal respiratory effort HEART: regular rate & rhythm ABDOMEN:  normoactive bowel sounds , non tender, not distended. Extremity: no pedal edema PSYCH: alert & oriented x 3 with fluent speech NEURO: no focal motor/sensory deficits    LABORATORY DATA:  I have reviewed the data as listed .    Latest Ref Rng & Units 09/22/2023    1:10 PM 08/10/2023    1:11 PM 06/11/2023    7:44 AM  CBC  WBC 4.0 - 10.5 K/uL 7.6  7.2  7.8   Hemoglobin 13.0 - 17.0 g/dL 16.1  09.6  04.5   Hematocrit 39.0 - 52.0 % 42.4  41.2  40.1   Platelets 150 - 400 K/uL 193  190  244     CBC    Component Value Date/Time   WBC 7.6 09/22/2023 1310   WBC 8.0 07/31/2022 1556   RBC 4.14 (L) 09/22/2023 1310   HGB 13.7 09/22/2023 1310   HGB 14.0 06/11/2017 0808   HCT 42.4 09/22/2023 1310   HCT 40.7 06/11/2017 0808   PLT 193 09/22/2023 1310   PLT 223 06/11/2017 0808   MCV 102.4 (H) 09/22/2023 1310   MCV 112.1 (H) 06/11/2017 0808   MCH 33.1 09/22/2023 1310   MCHC 32.3 09/22/2023 1310   RDW 14.5 09/22/2023 1310   RDW 13.9 06/11/2017 0808   LYMPHSABS 2.0 09/22/2023 1310   LYMPHSABS 1.6 06/11/2017 0808   MONOABS 1.0 09/22/2023  1310   MONOABS 0.5 06/11/2017 0808   EOSABS 0.2 09/22/2023 1310   EOSABS 0.1  06/11/2017 0808   BASOSABS 0.1 09/22/2023 1310   BASOSABS 0.0 06/11/2017 0808   Lab Results  Component Value Date   IRON 158 09/22/2023   TIBC 220 (L) 09/22/2023   FERRITIN 947 (H) 09/22/2023       Latest Ref Rng & Units 09/22/2023    1:10 PM 08/10/2023    1:11 PM 06/11/2023    7:44 AM  CMP  Glucose 70 - 99 mg/dL 161  096  44   BUN 8 - 23 mg/dL 39  74  74   Creatinine 0.61 - 1.24 mg/dL 0.45  4.09  8.11   Sodium 135 - 145 mmol/L 139  142  142   Potassium 3.5 - 5.1 mmol/L 3.6  3.1  3.1   Chloride 98 - 111 mmol/L 115  114  112   CO2 22 - 32 mmol/L 16  18  20    Calcium 8.9 - 10.3 mg/dL 8.0  8.6  8.9   Total Protein 6.5 - 8.1 g/dL 6.0  6.7  6.6   Total Bilirubin 0.0 - 1.2 mg/dL 0.6  0.5  0.9   Alkaline Phos 38 - 126 U/L 147  158  122   AST 15 - 41 U/L 32  32  30   ALT 0 - 44 U/L 35  33  42    08/18/2019 CT Abdomen Pelvis Wo Contrast (Accession [id number])   08/18/2019 CT Chest Wo Contrast (Accession [id number])   RADIOGRAPHIC STUDIES: I have personally reviewed the radiological images as listed and agreed with the findings in the report. NM PET Image Restag (PS) Skull Base To Thigh Result Date: 09/13/2023 CLINICAL DATA:  Subsequent treatment strategy for metastatic renal cell cancer. EXAM: NUCLEAR MEDICINE PET SKULL BASE TO THIGH TECHNIQUE: 8.9 mCi F-18 FDG was injected intravenously. Full-ring PET imaging was performed from the skull base to thigh after the radiotracer. CT data was obtained and used for attenuation correction and anatomic localization. Fasting blood glucose: 80 mg/dl COMPARISON:  Prior PET-CT 10/24/2022 and recent CT scans 06/15/2023 FINDINGS: Mediastinal blood pool activity: SUV max 2.81 Liver activity: SUV max NA NECK: No hypermetabolic lymph nodes in the neck. Incidental CT findings: Stable bilateral carotid artery calcifications. CHEST: No enlarged or hypermetabolic mediastinal or hilar nodes. No supraclavicular axillary. Stable bilateral pulmonary nodules when  compared to the most recent chest CT from 11/11 2024. No new or progressive changes. No hypermetabolism in the larger nodules. Stable scarring changes in the right lung. No pleural effusions or pleural lesions. Incidental CT findings: Stable aortic and coronary artery calcifications. ABDOMEN/PELVIS: No abnormal hypermetabolic activity within the liver, pancreas, adrenal glands, or spleen. No hypermetabolic lymph nodes in the abdomen or pelvis. Incidental CT findings: Stable extensive surgical changes with right nephrectomy and pancreatectomy, distal gastrectomy, splenectomy and Roux-en-Y anastomosis. Moderate diffuse bladder wall thickening could in part be due to poor distension and enlarged prostate gland with partial bladder outlet obstruction. SKELETON: No findings for osseous metastatic disease. Incidental CT findings: Remote right fourth rib fracture. IMPRESSION: 1. Stable bilateral pulmonary nodules when compared to the most recent chest CT from 06/15/2023. No new or progressive changes. No hypermetabolism in the larger nodules. 2. No findings for hypermetabolic metastatic disease involving the neck, chest, abdomen/pelvis or bony structures. 3. Stable extensive surgical changes with right nephrectomy and pancreatectomy, distal gastrectomy, splenectomy and Roux-en-Y anastomosis. 4. Moderate diffuse bladder wall thickening could in  part be due to poor distension and enlarged prostate gland with partial bladder outlet obstruction. 5. Aortic atherosclerosis. Electronically Signed   By: Rudie Meyer M.D.   On: 09/13/2023 17:30      ASSESSMENT & PLAN:   73 y.o. Caucasian male with  #1 Recurrent metastatic Renal cell carcinoma with multiple lung metastases. Has been on stable on 1st line Sutent for >43yr. Has had SBRT to the dominant right lung nodule with partial response. No lab or clinical evidence of metastatic RCC progression at this time.  PET/CT 08/08/2016 with a couple of mildly enlarge LLL  pulmonary nodules - no other overt evidence of RCC progression at this time.  PET CT scan 11/06/2016 - no overt evidence of RCC progression at this time .  PET/CT 03/13/2017 - no overt evidence of RCC progression at this time .  PET/CT 08/10/2017 - no overt evidence of RCC progression at this time .  PET/ CT done 08/10/2017, shows no evidence of  Progression at this time.  12/07/17 PET which revealed No findings suspicious for recurrent or metastatic disease.  03/22/18 PET/CT revealed One of the left lower lobe nodules has very minimally enlarged over the last 2 years, currently 0.6 by 0.4 cm and measuring 0.4 by 0.3 cm on 03/20/2016. This probably represents a previously treated metastatic nodule given that it measured 1.0 by 0.8 cm on 06/06/2015. This nodule may be subject to some slice selection phenomenon and also motion artifact given proximity to the diaphragm. Surveillance is likely warranted. 2. Chronic central lucency, marginal bony deposition, and slow healing of the right lateral fourth rib fracture. Possibilities may include underlying radiation necrosis as a cause for fracture leading to slow healing, or a low-grade pathologic lesion. Maximum SUV at this fracture site is 2.9, previously 2.4. 3. 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 There is only low-grade activity in this vicinity characteristic of radiation fibrosis, without focal activity. 4. Other imaging findings of potential clinical significance: Aortic Atherosclerosis. Coronary atherosclerosis. Mild cardiomegaly. Right nephrectomy. 8 mm nonobstructive calculus in the left kidney lower pole.   08/24/18 CT C/A/P revealed "Stable small left lower lobe pulmonary nodules from recent prior studies. As previously noted, these have decreased in size from PET-CT 06/06/2015, likely treated metastases. No new or enlarging pulmonary nodules. 2. Postsurgical changes in the  upper abdomen without evidence of local recurrence or metastatic disease. 3. Nonobstructing left renal calculus. 4. Stable radiation changes in the right upper lobe with probable chronic pathologic fracture of the right 4th rib laterally."  03/07/2019 CT C/A wo contrast revealed "1. Stable left lower lobe pulmonary nodules, likely treated Metastases. 2. No additional evidence of metastatic disease. 3. Left renal stone. 4. Post radiation scarring in the right hemithorax with a healed adjacent right rib fracture which is presumably pathologic in Etiology. 5.  Aortic atherosclerosis (ICD10-170.0). 6. Enlarged pulmonic trunk, indicative of pulmonary arterial Hypertension."  08/14/22 CT CAP: Extensive surgical changes in the abdomen from right nephrectomy, splenectomy and presumed Whipple procedure. No abnormal soft tissue or lymph node enlargement seen in the abdomen and pelvis including towards the surgical bed. Multiple small lung nodules. These are similar in number but several have slightly increased in size. Stable fracture involving the lateral aspect of the right fourth rib with some irregularity. Please correlate for a metastatic lesion.  10/24/22 NM PET: Status post left nephrectomy. Small bilateral pulmonary nodules are non FDG  avid but beneath the size threshold for PET sensitivity. Continued attention on follow-up is suggested. Otherwise, no findings suspicious for metastatic disease. Additional stable ancillary and postsurgical findings, as above. Aortic Atherosclerosis (ICD10-I70.0).  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 Corliss Skains for mx. Not currently on any medications  #6 DM2 -continue f/u with Dr Eloise Harman for continue mx, blood sugar at home per patient.   #7Postsplenectomy status  #8 abnormal liver function tests.  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 LFTs WNL today  PLAN: -Discussed lab results from today, 09/22/2023, in detail with the patient. CBC stable. CMP shows elevated blood glucose level of 318, elevated BUN of 39, elevated creatinine of 3.26, low calcium level of 8.0, and elevated Alkaline phosphate level of 147. Iron saturation is elevated at 72%. Ferritin level is pending.  -Discussed PET scan results from 09/03/2023 in detail with the patient. Showed  Stable bilateral pulmonary nodules when compared to the most recent chest CT from 06/15/2023. No new or progressive changes. No hypermetabolism in the larger nodules. No findings for hypermetabolic metastatic disease involving the neck, chest, abdomen/pelvis or bony structures. Stable extensive surgical changes with right nephrectomy and pancreatectomy, distal gastrectomy, splenectomy and Roux-en-Y anastomosis. Moderate diffuse bladder wall thickening could in part be due to poor distension and enlarged prostate gland with partial bladder outlet obstruction. -Discussed with the patient that the goal of ferritin level is around 500. Last ferritin level was around 800. Ferritin level today is pending.  -Discussed with the patient that he can get phlebotomy today.  -Patient will proceed with phlebotomy today. Limit to -Answered all of patient's questions.    FOLLOW-UP: RTC with Dr Candise Che with labs and next appointment for therapeutic phlebotomy in 3 months  The total time spent in the appointment was 32 minutes* .  All of the patient's questions were answered with apparent satisfaction. The patient knows to call the clinic with any problems, questions or concerns.   Wyvonnia Lora MD MS AAHIVMS [facility] Hematology/Oncology Physician [facility]  .*Total Encounter Time as defined by the Centers for Medicare and Medicaid  Services includes, in addition to the face-to-face time of a patient visit (documented in the note above) non-face-to-face time: obtaining and reviewing outside history, ordering and reviewing medications, tests or procedures, care coordination (communications with other health care professionals or caregivers) and documentation in the medical record.   I,Param Shah,acting as a Neurosurgeon for Wyvonnia Lora, MD.,have documented all relevant documentation on the behalf of Wyvonnia Lora, MD,as directed by  Wyvonnia Lora, MD while in the presence of Wyvonnia Lora, MD.   .I have reviewed the above documentation for accuracy and completeness, and I agree with the above. Dylan Maine MD

## 2023-09-22 NOTE — Patient Instructions (Signed)

## 2023-09-25 ENCOUNTER — Telehealth: Payer: Self-pay | Admitting: Hematology

## 2023-09-25 NOTE — Telephone Encounter (Signed)
 Spoke with patient wife confirming upcoming appointment

## 2023-09-28 ENCOUNTER — Encounter: Payer: Self-pay | Admitting: Hematology

## 2023-12-21 ENCOUNTER — Other Ambulatory Visit: Payer: Self-pay

## 2023-12-21 DIAGNOSIS — C649 Malignant neoplasm of unspecified kidney, except renal pelvis: Secondary | ICD-10-CM

## 2023-12-22 ENCOUNTER — Inpatient Hospital Stay: Payer: Medicare Other

## 2023-12-22 ENCOUNTER — Inpatient Hospital Stay: Payer: Medicare Other | Attending: Hematology | Admitting: Hematology

## 2023-12-22 VITALS — BP 144/73 | HR 59 | Temp 97.5°F | Resp 20 | Wt 187.7 lb

## 2023-12-22 DIAGNOSIS — Z923 Personal history of irradiation: Secondary | ICD-10-CM | POA: Insufficient documentation

## 2023-12-22 DIAGNOSIS — C7801 Secondary malignant neoplasm of right lung: Secondary | ICD-10-CM | POA: Diagnosis not present

## 2023-12-22 DIAGNOSIS — Z85528 Personal history of other malignant neoplasm of kidney: Secondary | ICD-10-CM | POA: Insufficient documentation

## 2023-12-22 DIAGNOSIS — C649 Malignant neoplasm of unspecified kidney, except renal pelvis: Secondary | ICD-10-CM

## 2023-12-22 DIAGNOSIS — C7802 Secondary malignant neoplasm of left lung: Secondary | ICD-10-CM | POA: Insufficient documentation

## 2023-12-22 LAB — CBC WITH DIFFERENTIAL (CANCER CENTER ONLY)
Abs Immature Granulocytes: 0.02 10*3/uL (ref 0.00–0.07)
Basophils Absolute: 0.1 10*3/uL (ref 0.0–0.1)
Basophils Relative: 1 %
Eosinophils Absolute: 0.2 10*3/uL (ref 0.0–0.5)
Eosinophils Relative: 3 %
HCT: 40.2 % (ref 39.0–52.0)
Hemoglobin: 13.3 g/dL (ref 13.0–17.0)
Immature Granulocytes: 0 %
Lymphocytes Relative: 28 %
Lymphs Abs: 2.3 10*3/uL (ref 0.7–4.0)
MCH: 33.3 pg (ref 26.0–34.0)
MCHC: 33.1 g/dL (ref 30.0–36.0)
MCV: 100.8 fL — ABNORMAL HIGH (ref 80.0–100.0)
Monocytes Absolute: 1.5 10*3/uL — ABNORMAL HIGH (ref 0.1–1.0)
Monocytes Relative: 19 %
Neutro Abs: 3.9 10*3/uL (ref 1.7–7.7)
Neutrophils Relative %: 49 %
Platelet Count: 183 10*3/uL (ref 150–400)
RBC: 3.99 MIL/uL — ABNORMAL LOW (ref 4.22–5.81)
RDW: 13.2 % (ref 11.5–15.5)
WBC Count: 8 10*3/uL (ref 4.0–10.5)
nRBC: 0 % (ref 0.0–0.2)

## 2023-12-22 LAB — IRON AND IRON BINDING CAPACITY (CC-WL,HP ONLY)
Iron: 115 ug/dL (ref 45–182)
Saturation Ratios: 49 % — ABNORMAL HIGH (ref 17.9–39.5)
TIBC: 235 ug/dL — ABNORMAL LOW (ref 250–450)
UIBC: 120 ug/dL (ref 117–376)

## 2023-12-22 LAB — CMP (CANCER CENTER ONLY)
ALT: 29 U/L (ref 0–44)
AST: 24 U/L (ref 15–41)
Albumin: 3.9 g/dL (ref 3.5–5.0)
Alkaline Phosphatase: 157 U/L — ABNORMAL HIGH (ref 38–126)
Anion gap: 10 (ref 5–15)
BUN: 62 mg/dL — ABNORMAL HIGH (ref 8–23)
CO2: 20 mmol/L — ABNORMAL LOW (ref 22–32)
Calcium: 8.3 mg/dL — ABNORMAL LOW (ref 8.9–10.3)
Chloride: 109 mmol/L (ref 98–111)
Creatinine: 3.56 mg/dL — ABNORMAL HIGH (ref 0.61–1.24)
GFR, Estimated: 17 mL/min — ABNORMAL LOW (ref >60)
Glucose, Bld: 196 mg/dL — ABNORMAL HIGH (ref 70–99)
Potassium: 3.1 mmol/L — ABNORMAL LOW (ref 3.5–5.1)
Sodium: 139 mmol/L (ref 135–145)
Total Bilirubin: 0.6 mg/dL (ref 0.0–1.2)
Total Protein: 6.3 g/dL — ABNORMAL LOW (ref 6.5–8.1)

## 2023-12-22 LAB — FERRITIN: Ferritin: 1071 ng/mL — ABNORMAL HIGH (ref 24–336)

## 2023-12-22 NOTE — Progress Notes (Signed)
 Dylan Jefferson presents today for phlebotomy per MD orders.  Per Dr Salomon Cree, goal is 350 grams today. 16 g to R antecubital, unsuccessful, very little blood return.  20 g to L antecubital without difficulty, blood return noted. Phlebotomy procedure started at 1512 and ended at 1533. 212 grams removed. Patient observed for 25 minutes after procedure without any incident. Patient tolerated procedure well. IV needle removed intact. VS stable, DC'd home in stable condition.

## 2023-12-22 NOTE — Progress Notes (Signed)
 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 2) Remote history of metastatic renal cell carcinoma Diagnosed with renal cell carcinoma 20 years ago and had a right nephrectomy. About 8 years after that he was noted to have abdominal recurrence in his pancreas spleen and small intestine and had a Whipple's procedure and significant abdominal surgery and notes that 10 out of 17 lymph nodes were positive. He also had his gallbladder removed. Postoperative course was complicated by an internal hemorrhage as per his report. Patient notes 2-3 years after that he had recurrence in his thyroid  that led to a thyroidectomy. [June 2004] later he had partial gastrectomy for local recurrence. [February 2005] when he presented with GI bleeding. Patient notes that he has had no known evidence of recurrence over the last 8 years until his recent CT scan showed lung nodules.  Current Treatment: Active Surveillance (Sutent  on hold now from 10/2020) Previous treatment Sutent  on cycle 1 - 50 mg by mouth daily for 4 weeks on and 2-weeks off. It was dose adjusted to 37.5 mg by mouth daily for 2 weeks with 1 week of to help mitigate issues with fatigue, mild hyperbilirubinemia, cytopenias. SBRT to dominant RML nodule.   INTERVAL HISTORY: Dylan Jefferson is a 73 y.o. 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  Patient was last seen by me on 09/22/2023 and was doing well overall with no new medical complaints.  Here for interval follow-up of his metastatic renal cell carcinoma  and high-grade hemochromatosis. He notes no acute new symptoms since his last clinic visit. Therapeutic phlebotomy today but we are only able to remove 200 to 250 cc of blood. Notes no acute new focal symptoms.  Intermittent flares of his rheumatoid arthritis. He is concerned about his renal function.   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, Ultrasound positive synovitis    Kidney disease    Macular degeneration    Bilateral eyes   Nephrolithiasis    Osteoarthritis of both hands 05/28/2016   Osteoarthritis of both knees 05/28/2016   Peripheral vascular disorder (HCC) 05/28/2016   Peyronie's syndrome    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   Rheumatoid arthritis (HCC)    Thyroid  cancer (HCC) 05/28/2016   Past Surgical History:  Procedure Laterality Date   CATARACT EXTRACTION Bilateral 2013   CHOLECYSTECTOMY     CYSTOSCOPY/URETEROSCOPY/HOLMIUM LASER/STENT PLACEMENT Left 03/31/2022   Procedure: CYSTOSCOPY/ RETROGRADE/URETEROSCOPY/HOLMIUM LASER/STENT PLACEMENT;  Surgeon: Florencio Hunting, MD;  Location: [facility];  Service: Urology;  Laterality: Left;   GASTRECTOMY     tumor removed   GLAUCOMA SURGERY     Laser surgery   IR US  GUIDE VASC ACCESS RIGHT  08/22/2021   IR VENOGRAM HEPATIC W HEMODYNAMIC EVALUATION  08/22/2021   LIPOMA EXCISION Right 1999  NEPHRECTOMY Right    PANCREATECTOMY     SPLENECTOMY     THYROIDECTOMY     URETERAL STENT PLACEMENT Left 2012   WHIPPLE PROCEDURE     Social History   Tobacco Use   Smoking status: Former    Types: Cigarettes   Smokeless tobacco: Never  Vaping Use   Vaping status: Never Used  Substance Use Topics    Alcohol use: Yes    Comment: occasional beer   Drug use: No    ALLERGIES:  is allergic to carvedilol , iodine, and prozac [fluoxetine hcl].  MEDICATIONS:  Current Outpatient Medications  Medication Sig Dispense Refill   aspirin  81 MG tablet Take 81 mg by mouth daily.     atenolol  (TENORMIN ) 50 MG tablet Take 50 mg by mouth daily. (Patient not taking: Reported on 09/23/2023)     augmented betamethasone dipropionate (DIPROLENE-AF) 0.05 % cream Apply 1 application topically daily as needed (rash).   0   azelastine (OPTIVAR) 0.05 % ophthalmic solution Place 1 drop into both eyes in the morning and at bedtime.     calcium  citrate-vitamin D  (CITRACAL+D) 315-200 MG-UNIT per tablet Take 1 tablet by mouth 2 (two) times daily.     Cholecalciferol (VITAMIN D3) 50 MCG (2000 UT) TABS Take 2,000 Units by mouth daily. 2 Tablets daily     Cyanocobalamin  (VITAMIN B-12) 2500 MCG SUBL Take 2,500 mcg by mouth daily.     diclofenac  Sodium (VOLTAREN ) 1 % GEL Apply 3 g topically 3 (three) times daily as needed (joint pain).      dicyclomine  (BENTYL ) 10 MG capsule Take 1 capsule (10 mg total) by mouth every 8 (eight) hours as needed for spasms. 30 capsule 1   docusate sodium (COLACE) 100 MG capsule Take 100 mg by mouth 2 (two) times daily as needed for mild constipation.      dorzolamide -timolol  (COSOPT ) 22.3-6.8 MG/ML ophthalmic solution Place 1 drop into both eyes 2 (two) times daily.   1   famotidine  (PEPCID ) 20 MG tablet Take 20 mg by mouth 3 (three) times daily.     gabapentin (NEURONTIN) 300 MG capsule Take 300 mg by mouth daily as needed (pain).     Glucosamine-MSM-Hyaluronic Acd 750-375-30 MG TABS Take 2 tablets by mouth daily.     hydrALAZINE  (APRESOLINE ) 50 MG tablet Take 50 mg by mouth 3 (three) times daily.     HYDROcodone-acetaminophen  (NORCO/VICODIN) 5-325 MG tablet Take 1 tablet by mouth every 6 (six) hours as needed for severe pain.     insulin  lispro (HUMALOG) 100 UNIT/ML injection Inject 2-8 Units  into the skin 4 (four) times daily -  with meals and at bedtime. Per sliding scale     Lactobacillus (ACIDOPHILUS) CAPS capsule Take 1 capsule by mouth daily.     latanoprost  (XALATAN ) 0.005 % ophthalmic solution Place 1 drop into both eyes at bedtime.   1   LEVEMIR  100 UNIT/ML injection Inject 8-10 Units into the skin See admin instructions. Inject 8 units into the skin in the morning and 10 units at night (Patient not taking: Reported on 08/10/2023)  1   levothyroxine  (SYNTHROID ) 125 MCG tablet Take 250 mcg by mouth daily before breakfast.     losartan (COZAAR) 25 MG tablet Take 25 mg by mouth daily. (Patient not taking: Reported on 08/10/2023)     lovastatin (MEVACOR) 40 MG tablet Take 40 mg by mouth at bedtime.      Multiple Vitamins-Minerals (PRESERVISION AREDS 2) CAPS Take 1 capsule by mouth  2 (two) times daily.     Omega-3 Fatty Acids (FISH OIL) 1200 MG CAPS Take 1 capsule by mouth daily.     omeprazole  (PRILOSEC) 20 MG capsule Take 1 capsule (20 mg total) by mouth daily. (Patient taking differently: Take 20 mg by mouth at bedtime as needed (acid reflux).) 30 capsule 3   Pancrelipase , Lip-Prot-Amyl, 6000-19000 units CPEP Take 4 capsules by mouth 3 (three) times daily.     sildenafil (VIAGRA) 100 MG tablet Take 100 mg by mouth daily as needed for erectile dysfunction.     torsemide  (DEMADEX ) 20 MG tablet Take 40 mg by mouth in the morning, at noon, and at bedtime.     traMADol  (ULTRAM ) 50 MG tablet Take 50 mg by mouth 2 (two) times daily.     TRESIBA 100 UNIT/ML SOLN Inject 8 Units into the skin 2 (two) times daily. Take 8 Units q am and 10 units q pm     No current facility-administered medications for this visit.    REVIEW OF SYSTEMS:   10 Point review of Systems was done is negative except as noted above.   PHYSICAL EXAMINATION: ECOG FS:1 - Symptomatic but completely ambulatory  Vitals:   12/22/23 1348  BP: (!) 144/73  Pulse: (!) 59  Resp: 20  Temp: (!) 97.5 F (36.4 C)  SpO2:  96%      Wt Readings from Last 3 Encounters:  09/22/23 187 lb 3.2 oz (84.9 kg)  08/10/23 180 lb 8 oz (81.9 kg)  06/09/23 181 lb 14.4 oz (82.5 kg)   Body mass index is 26.93 kg/m.       GENERAL:alert, in no acute distress and comfortable SKIN: no acute rashes, no significant lesions EYES: conjunctiva are pink and non-injected, sclera anicteric OROPHARYNX: MMM, no exudates, no oropharyngeal erythema or ulceration NECK: supple, no JVD LYMPH:  no palpable lymphadenopathy in the cervical, axillary or inguinal regions LUNGS: clear to auscultation b/l with normal respiratory effort HEART: regular rate & rhythm ABDOMEN:  normoactive bowel sounds , non tender, not distended. Extremity: no pedal edema PSYCH: alert & oriented x 3 with fluent speech NEURO: no focal motor/sensory deficits   LABORATORY DATA:  I have reviewed the data as listed .    Latest Ref Rng & Units 12/22/2023    1:34 PM 09/22/2023    1:10 PM 08/10/2023    1:11 PM  CBC  WBC 4.0 - 10.5 K/uL 8.0  7.6  7.2   Hemoglobin 13.0 - 17.0 g/dL 81.1  91.4  78.2   Hematocrit 39.0 - 52.0 % 40.2  42.4  41.2   Platelets 150 - 400 K/uL 183  193  190     CBC    Component Value Date/Time   WBC 8.0 12/22/2023 1334   WBC 8.0 07/31/2022 1556   RBC 3.99 (L) 12/22/2023 1334   HGB 13.3 12/22/2023 1334   HGB 14.0 06/11/2017 0808   HCT 40.2 12/22/2023 1334   HCT 40.7 06/11/2017 0808   PLT 183 12/22/2023 1334   PLT 223 06/11/2017 0808   MCV 100.8 (H) 12/22/2023 1334   MCV 112.1 (H) 06/11/2017 0808   MCH 33.3 12/22/2023 1334   MCHC 33.1 12/22/2023 1334   RDW 13.2 12/22/2023 1334   RDW 13.9 06/11/2017 0808   LYMPHSABS 2.3 12/22/2023 1334   LYMPHSABS 1.6 06/11/2017 0808   MONOABS 1.5 (H) 12/22/2023 1334   MONOABS 0.5 06/11/2017 0808   EOSABS 0.2 12/22/2023 1334   EOSABS 0.1 06/11/2017 9562  BASOSABS 0.1 12/22/2023 1334   BASOSABS 0.0 06/11/2017 0808   Lab Results  Component Value Date   IRON 115 12/22/2023   TIBC 235 (L)  12/22/2023   FERRITIN 1,071 (H) 12/22/2023       Latest Ref Rng & Units 12/22/2023    1:34 PM 09/22/2023    1:10 PM 08/10/2023    1:11 PM  CMP  Glucose 70 - 99 mg/dL 161  096  045   BUN 8 - 23 mg/dL 62  39  74   Creatinine 0.61 - 1.24 mg/dL 4.09  8.11  9.14   Sodium 135 - 145 mmol/L 139  139  142   Potassium 3.5 - 5.1 mmol/L 3.1  3.6  3.1   Chloride 98 - 111 mmol/L 109  115  114   CO2 22 - 32 mmol/L 20  16  18    Calcium  8.9 - 10.3 mg/dL 8.3  8.0  8.6   Total Protein 6.5 - 8.1 g/dL 6.3  6.0  6.7   Total Bilirubin 0.0 - 1.2 mg/dL 0.6  0.6  0.5   Alkaline Phos 38 - 126 U/L 157  147  158   AST 15 - 41 U/L 24  32  32   ALT 0 - 44 U/L 29  35  33    08/18/2019 CT Abdomen Pelvis Wo Contrast (Accession [id number])   08/18/2019 CT Chest Wo Contrast (Accession [id number])   RADIOGRAPHIC STUDIES: I have personally reviewed the radiological images as listed and agreed with the findings in the report. No results found.     ASSESSMENT & PLAN:   73 y.o. Caucasian male with  #1 Recurrent metastatic Renal cell carcinoma with multiple lung metastases. Has been on stable on 1st line Sutent  for >30yr. Has had SBRT to the dominant right lung nodule with partial response. No lab or clinical evidence of metastatic RCC progression at this time.  PET/CT 08/08/2016 with a couple of mildly enlarge LLL pulmonary nodules - no other overt evidence of RCC progression at this time.  PET CT scan 11/06/2016 - no overt evidence of RCC progression at this time .  PET/CT 03/13/2017 - no overt evidence of RCC progression at this time .  PET/CT 08/10/2017 - no overt evidence of RCC progression at this time .  PET/ CT done 08/10/2017, shows no evidence of  Progression at this time.  12/07/17 PET which revealed No findings suspicious for recurrent or metastatic disease.  03/22/18 PET/CT revealed One of the left lower lobe nodules has very minimally enlarged over the last 2 years, currently 0.6 by 0.4 cm and measuring  0.4 by 0.3 cm on 03/20/2016. This probably represents a previously treated metastatic nodule given that it measured 1.0 by 0.8 cm on 06/06/2015. This nodule may be subject to some slice selection phenomenon and also motion artifact given proximity to the diaphragm. Surveillance is likely warranted. 2. Chronic central lucency, marginal bony deposition, and slow healing of the right lateral fourth rib fracture. Possibilities may include underlying radiation necrosis as a cause for fracture leading to slow healing, or a low-grade pathologic lesion. Maximum SUV at this fracture site is 2.9, previously 2.4. 3. 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 There is only low-grade activity in this vicinity characteristic of radiation fibrosis, without focal activity. 4. Other imaging findings of potential clinical significance: Aortic Atherosclerosis. Coronary atherosclerosis. Mild cardiomegaly. Right nephrectomy. 8 mm nonobstructive  calculus in the left kidney lower pole.   08/24/18 CT C/A/P revealed "Stable small left lower lobe pulmonary nodules from recent prior studies. As previously noted, these have decreased in size from PET-CT 06/06/2015, likely treated metastases. No new or enlarging pulmonary nodules. 2. Postsurgical changes in the upper abdomen without evidence of local recurrence or metastatic disease. 3. Nonobstructing left renal calculus. 4. Stable radiation changes in the right upper lobe with probable chronic pathologic fracture of the right 4th rib laterally."  03/07/2019 CT C/A wo contrast revealed "1. Stable left lower lobe pulmonary nodules, likely treated Metastases. 2. No additional evidence of metastatic disease. 3. Left renal stone. 4. Post radiation scarring in the right hemithorax with a healed adjacent right rib fracture which is presumably pathologic in Etiology. 5.  Aortic atherosclerosis (ICD10-170.0). 6. Enlarged  pulmonic trunk, indicative of pulmonary arterial Hypertension."  08/14/22 CT CAP: Extensive surgical changes in the abdomen from right nephrectomy, splenectomy and presumed Whipple procedure. No abnormal soft tissue or lymph node enlargement seen in the abdomen and pelvis including towards the surgical bed. Multiple small lung nodules. These are similar in number but several have slightly increased in size. Stable fracture involving the lateral aspect of the right fourth rib with some irregularity. Please correlate for a metastatic lesion.  10/24/22 NM PET: Status post left nephrectomy. Small bilateral pulmonary nodules are non FDG avid but beneath the size threshold for PET sensitivity. Continued attention on follow-up is suggested. Otherwise, no findings suspicious for metastatic disease. Additional stable ancillary and postsurgical findings, as above. Aortic Atherosclerosis (ICD10-I70.0).  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 Alvira Josephs for mx. Not currently on any medications  #6 DM2 -continue f/u with Dr Efraim Grange for continue mx, blood sugar at home per patient.   #7Postsplenectomy status  #8 abnormal liver function tests.  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 LFTs WNL today  PLAN:  -discussed lab results from today, 12/22/23, in detail with patient CBC is within normal limits CMP shows chronic kidney disease Ferritin level is about 1000.  This is reflective of iron overload as well as elevation of the acute phase reactant in the context of rheumatoid arthritis. - Patient will proceed with therapeutic phlebotomy today.  The goal of his ferritin is about 500 in the context of severe chronic kidney disease and  inflammation from RA. Patient notes no focal symptoms suggestive of renal cell carcinoma progression at this time. PET/CT scan done on 09/03/2023-1. Stable bilateral pulmonary nodules when compared to the most recent chest CT from 06/15/2023. No new or progressive changes. No hypermetabolism in the larger nodules. 2. No findings for hypermetabolic metastatic disease involving the neck, chest, abdomen/pelvis or bony structures. 3. Stable extensive surgical changes with right nephrectomy and pancreatectomy, distal gastrectomy, splenectomy and Roux-en-Y anastomosis. 4. Moderate diffuse bladder wall thickening could in part be due to poor distension and enlarged prostate gland with partial bladder outlet obstruction.  - He is having some urinary symptoms and will follow-up with his primary care physician regarding this and for possible obstruction from an enlarged prostate. - He follows with Dr. Zelda Hickman for his nephrology  FOLLOW-UP: Return to clinic with Dr. Salomon Cree with labs and appointment for therapeutic phlebotomy in 3 months  The total time spent in  the appointment was 30 minutes* .  All of the patient's questions were answered with apparent satisfaction. The patient knows to call the clinic with any problems, questions or concerns.   Jacquelyn Matt MD MS AAHIVMS [facility] Hematology/Oncology Physician [facility]  .*Total Encounter Time as defined by the Centers for Medicare and Medicaid Services includes, in addition to the face-to-face time of a patient visit (documented in the note above) non-face-to-face time: obtaining and reviewing outside history, ordering and reviewing medications, tests or procedures, care coordination (communications with other health care professionals or caregivers) and documentation in the medical record.    I,Mitra Faeizi,acting as a Neurosurgeon for Jacquelyn Matt, MD.,have documented all relevant documentation on the behalf of Jacquelyn Matt, MD,as directed  by  Jacquelyn Matt, MD while in the presence of Jacquelyn Matt, MD.  .I have reviewed the above documentation for accuracy and completeness, and I agree with the above. .Gautam Kishore Kale MD

## 2023-12-29 ENCOUNTER — Encounter: Payer: Self-pay | Admitting: Hematology

## 2024-04-05 ENCOUNTER — Other Ambulatory Visit: Payer: Self-pay

## 2024-04-05 DIAGNOSIS — C649 Malignant neoplasm of unspecified kidney, except renal pelvis: Secondary | ICD-10-CM

## 2024-04-05 DIAGNOSIS — C7900 Secondary malignant neoplasm of unspecified kidney and renal pelvis: Secondary | ICD-10-CM

## 2024-04-06 ENCOUNTER — Inpatient Hospital Stay: Admitting: Hematology

## 2024-04-06 ENCOUNTER — Inpatient Hospital Stay: Attending: Hematology

## 2024-04-06 ENCOUNTER — Encounter: Payer: Self-pay | Admitting: Hematology

## 2024-04-06 ENCOUNTER — Inpatient Hospital Stay

## 2024-04-06 VITALS — BP 165/71 | HR 60 | Temp 97.9°F | Resp 18 | Ht 70.0 in | Wt 181.9 lb

## 2024-04-06 DIAGNOSIS — M069 Rheumatoid arthritis, unspecified: Secondary | ICD-10-CM | POA: Insufficient documentation

## 2024-04-06 DIAGNOSIS — C649 Malignant neoplasm of unspecified kidney, except renal pelvis: Secondary | ICD-10-CM

## 2024-04-06 DIAGNOSIS — Z87891 Personal history of nicotine dependence: Secondary | ICD-10-CM | POA: Insufficient documentation

## 2024-04-06 DIAGNOSIS — R7989 Other specified abnormal findings of blood chemistry: Secondary | ICD-10-CM | POA: Insufficient documentation

## 2024-04-06 DIAGNOSIS — Z85528 Personal history of other malignant neoplasm of kidney: Secondary | ICD-10-CM | POA: Diagnosis not present

## 2024-04-06 DIAGNOSIS — N189 Chronic kidney disease, unspecified: Secondary | ICD-10-CM | POA: Insufficient documentation

## 2024-04-06 DIAGNOSIS — C7801 Secondary malignant neoplasm of right lung: Secondary | ICD-10-CM | POA: Diagnosis present

## 2024-04-06 DIAGNOSIS — Z923 Personal history of irradiation: Secondary | ICD-10-CM | POA: Insufficient documentation

## 2024-04-06 DIAGNOSIS — Z905 Acquired absence of kidney: Secondary | ICD-10-CM | POA: Insufficient documentation

## 2024-04-06 DIAGNOSIS — C7900 Secondary malignant neoplasm of unspecified kidney and renal pelvis: Secondary | ICD-10-CM

## 2024-04-06 DIAGNOSIS — E1122 Type 2 diabetes mellitus with diabetic chronic kidney disease: Secondary | ICD-10-CM | POA: Insufficient documentation

## 2024-04-06 LAB — CBC WITH DIFFERENTIAL (CANCER CENTER ONLY)
Abs Immature Granulocytes: 0.02 K/uL (ref 0.00–0.07)
Basophils Absolute: 0.1 K/uL (ref 0.0–0.1)
Basophils Relative: 1 %
Eosinophils Absolute: 0.5 K/uL (ref 0.0–0.5)
Eosinophils Relative: 8 %
HCT: 40.4 % (ref 39.0–52.0)
Hemoglobin: 13 g/dL (ref 13.0–17.0)
Immature Granulocytes: 0 %
Lymphocytes Relative: 24 %
Lymphs Abs: 1.5 K/uL (ref 0.7–4.0)
MCH: 33.3 pg (ref 26.0–34.0)
MCHC: 32.2 g/dL (ref 30.0–36.0)
MCV: 103.6 fL — ABNORMAL HIGH (ref 80.0–100.0)
Monocytes Absolute: 0.7 K/uL (ref 0.1–1.0)
Monocytes Relative: 12 %
Neutro Abs: 3.3 K/uL (ref 1.7–7.7)
Neutrophils Relative %: 55 %
Platelet Count: 210 K/uL (ref 150–400)
RBC: 3.9 MIL/uL — ABNORMAL LOW (ref 4.22–5.81)
RDW: 14.8 % (ref 11.5–15.5)
WBC Count: 6 K/uL (ref 4.0–10.5)
nRBC: 0 % (ref 0.0–0.2)

## 2024-04-06 LAB — IRON AND IRON BINDING CAPACITY (CC-WL,HP ONLY)
Iron: 103 ug/dL (ref 45–182)
Saturation Ratios: 54 % — ABNORMAL HIGH (ref 17.9–39.5)
TIBC: 192 ug/dL — ABNORMAL LOW (ref 250–450)
UIBC: 89 ug/dL — ABNORMAL LOW (ref 117–376)

## 2024-04-06 LAB — CMP (CANCER CENTER ONLY)
ALT: 44 U/L (ref 0–44)
AST: 45 U/L — ABNORMAL HIGH (ref 15–41)
Albumin: 3.7 g/dL (ref 3.5–5.0)
Alkaline Phosphatase: 145 U/L — ABNORMAL HIGH (ref 38–126)
Anion gap: 11 (ref 5–15)
BUN: 49 mg/dL — ABNORMAL HIGH (ref 8–23)
CO2: 17 mmol/L — ABNORMAL LOW (ref 22–32)
Calcium: 7.9 mg/dL — ABNORMAL LOW (ref 8.9–10.3)
Chloride: 112 mmol/L — ABNORMAL HIGH (ref 98–111)
Creatinine: 3.64 mg/dL — ABNORMAL HIGH (ref 0.61–1.24)
GFR, Estimated: 17 mL/min — ABNORMAL LOW (ref >60)
Glucose, Bld: 277 mg/dL — ABNORMAL HIGH (ref 70–99)
Potassium: 3.5 mmol/L (ref 3.5–5.1)
Sodium: 140 mmol/L (ref 135–145)
Total Bilirubin: 0.7 mg/dL (ref 0.0–1.2)
Total Protein: 6.6 g/dL (ref 6.5–8.1)

## 2024-04-06 LAB — FERRITIN: Ferritin: 1326 ng/mL — ABNORMAL HIGH (ref 24–336)

## 2024-04-12 ENCOUNTER — Encounter: Payer: Self-pay | Admitting: Hematology

## 2024-04-12 NOTE — Progress Notes (Signed)
 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 2) Remote history of metastatic renal cell carcinoma Diagnosed with renal cell carcinoma 20 years ago and had a right nephrectomy. About 8 years after that he was noted to have abdominal recurrence in his pancreas spleen and small intestine and had a Whipple's procedure and significant abdominal surgery and notes that 10 out of 17 lymph nodes were positive. He also had his gallbladder removed. Postoperative course was complicated by an internal hemorrhage as per his report. Patient notes 2-3 years after that he had recurrence in his thyroid  that led to a thyroidectomy. [June 2004] later he had partial gastrectomy for local recurrence. [February 2005] when he presented with GI bleeding. Patient notes that he has had no known evidence of recurrence over the last 8 years until his recent CT scan showed lung nodules.  Current Treatment: Active Surveillance (Sutent  on hold from 10/2020) Previous treatment Sutent  on cycle 1 - 50 mg by mouth daily for 4 weeks on and 2-weeks off. It was dose adjusted to 37.5 mg by mouth daily for 2 weeks with 1 week of to help mitigate issues with fatigue, mild hyperbilirubinemia, cytopenias. SBRT to dominant RML nodule.   INTERVAL HISTORY:  [name] is a 73 y.o. male here for his 73-month follow-up for his metastatic renal cell carcinoma hemithyroidectomy hemochromatosis. He notes no acute new focal symptoms.  No new shortness of breath or chest pain.  No abdominal pain or distention. Overall notes that he has been feeling more fatigue  and with sleeping poorly. His rheumatoid arthritis activity has been at baseline. He notes some weight loss which appears to be about 6 pounds since his last clinic visit. No overt GI bleeding.   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, Ultrasound positive synovitis    Kidney disease    Macular degeneration    Bilateral eyes   Nephrolithiasis    Osteoarthritis of both hands 05/28/2016   Osteoarthritis of both knees 05/28/2016   Peripheral vascular disorder (HCC) 05/28/2016   Peyronie's syndrome    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   Rheumatoid arthritis (HCC)    Thyroid  cancer (HCC) 05/28/2016   Past Surgical History:  Procedure Laterality Date   CATARACT EXTRACTION Bilateral 2013   CHOLECYSTECTOMY     CYSTOSCOPY/URETEROSCOPY/HOLMIUM LASER/STENT PLACEMENT Left 03/31/2022   Procedure: CYSTOSCOPY/ RETROGRADE/URETEROSCOPY/HOLMIUM LASER/STENT PLACEMENT;  Surgeon: Renda Glance, MD;  Location: [facility];  Service: Urology;  Laterality: Left;   GASTRECTOMY     tumor removed   GLAUCOMA SURGERY     Laser surgery   IR US  GUIDE VASC ACCESS RIGHT  08/22/2021   IR VENOGRAM HEPATIC W HEMODYNAMIC EVALUATION  08/22/2021   LIPOMA EXCISION Right 1999   NEPHRECTOMY Right    PANCREATECTOMY     SPLENECTOMY     THYROIDECTOMY  URETERAL STENT PLACEMENT Left 2012   WHIPPLE PROCEDURE     Social History   Tobacco Use   Smoking status: Former    Types: Cigarettes   Smokeless tobacco: Never  Vaping Use   Vaping status: Never Used  Substance Use Topics   Alcohol use: Yes    Comment: occasional beer   Drug use: No    ALLERGIES:  is allergic to carvedilol ,  iodine, and prozac [fluoxetine hcl].  MEDICATIONS:  Current Outpatient Medications  Medication Sig Dispense Refill   aspirin  81 MG tablet Take 81 mg by mouth daily.     atenolol  (TENORMIN ) 50 MG tablet Take 50 mg by mouth daily. (Patient not taking: Reported on 09/23/2023)     augmented betamethasone dipropionate (DIPROLENE-AF) 0.05 % cream Apply 1 application topically daily as needed (rash).   0   azelastine (OPTIVAR) 0.05 % ophthalmic solution Place 1 drop into both eyes in the morning and at bedtime.     calcium  citrate-vitamin D  (CITRACAL+D) 315-200 MG-UNIT per tablet Take 1 tablet by mouth 2 (two) times daily.     Cholecalciferol (VITAMIN D3) 50 MCG (2000 UT) TABS Take 2,000 Units by mouth daily. 2 Tablets daily     Cyanocobalamin  (VITAMIN B-12) 2500 MCG SUBL Take 2,500 mcg by mouth daily.     diclofenac  Sodium (VOLTAREN ) 1 % GEL Apply 3 g topically 3 (three) times daily as needed (joint pain).      dicyclomine  (BENTYL ) 10 MG capsule Take 1 capsule (10 mg total) by mouth every 8 (eight) hours as needed for spasms. 30 capsule 1   docusate sodium (COLACE) 100 MG capsule Take 100 mg by mouth 2 (two) times daily as needed for mild constipation.      dorzolamide -timolol  (COSOPT ) 22.3-6.8 MG/ML ophthalmic solution Place 1 drop into both eyes 2 (two) times daily.   1   famotidine  (PEPCID ) 20 MG tablet Take 20 mg by mouth 3 (three) times daily.     gabapentin (NEURONTIN) 300 MG capsule Take 300 mg by mouth daily as needed (pain).     Glucosamine-MSM-Hyaluronic Acd 750-375-30 MG TABS Take 2 tablets by mouth daily.     hydrALAZINE  (APRESOLINE ) 50 MG tablet Take 50 mg by mouth 3 (three) times daily.     HYDROcodone-acetaminophen  (NORCO/VICODIN) 5-325 MG tablet Take 1 tablet by mouth every 6 (six) hours as needed for severe pain.     insulin  lispro (HUMALOG) 100 UNIT/ML injection Inject 2-8 Units into the skin 4 (four) times daily -  with meals and at bedtime. Per sliding scale     Lactobacillus  (ACIDOPHILUS) CAPS capsule Take 1 capsule by mouth daily.     latanoprost  (XALATAN ) 0.005 % ophthalmic solution Place 1 drop into both eyes at bedtime.   1   LEVEMIR  100 UNIT/ML injection Inject 8-10 Units into the skin See admin instructions. Inject 8 units into the skin in the morning and 10 units at night (Patient not taking: Reported on 08/10/2023)  1   levothyroxine  (SYNTHROID ) 125 MCG tablet Take 250 mcg by mouth daily before breakfast.     losartan (COZAAR) 25 MG tablet Take 25 mg by mouth daily. (Patient not taking: Reported on 08/10/2023)     lovastatin (MEVACOR) 40 MG tablet Take 40 mg by mouth at bedtime.      Multiple Vitamins-Minerals (PRESERVISION AREDS 2) CAPS Take 1 capsule by mouth 2 (two) times daily.     Omega-3 Fatty Acids (FISH OIL) 1200 MG CAPS Take 1 capsule by  mouth daily.     omeprazole  (PRILOSEC) 20 MG capsule Take 1 capsule (20 mg total) by mouth daily. (Patient taking differently: Take 20 mg by mouth at bedtime as needed (acid reflux).) 30 capsule 3   Pancrelipase , Lip-Prot-Amyl, 6000-19000 units CPEP Take 4 capsules by mouth 3 (three) times daily.     sildenafil (VIAGRA) 100 MG tablet Take 100 mg by mouth daily as needed for erectile dysfunction.     torsemide  (DEMADEX ) 20 MG tablet Take 40 mg by mouth in the morning, at noon, and at bedtime.     traMADol  (ULTRAM ) 50 MG tablet Take 50 mg by mouth 2 (two) times daily.     TRESIBA 100 UNIT/ML SOLN Inject 8 Units into the skin 2 (two) times daily. Take 8 Units q am and 10 units q pm     No current facility-administered medications for this visit.    REVIEW OF SYSTEMS:   .10 Point review of Systems was done is negative except as noted above.  PHYSICAL EXAMINATION: ECOG FS:1 - Symptomatic but completely ambulatory  Vitals:   04/06/24 0927  BP: (!) 165/71  Pulse: 60  Resp: 18  Temp: 97.9 F (36.6 C)  SpO2: 97%   Wt Readings from Last 3 Encounters:  04/06/24 181 lb 14.4 oz (82.5 kg)  12/22/23 187 lb 11.2 oz (85.1  kg)  09/22/23 187 lb 3.2 oz (84.9 kg)   Body mass index is 26.1 kg/m.      GENERAL:alert,. GENERAL:alert, in no acute distress and comfortable SKIN: no acute rashes, no significant lesions EYES: conjunctiva are pink and non-injected, sclera anicteric OROPHARYNX: MMM, no exudates, no oropharyngeal erythema or ulceration NECK: supple, no JVD LYMPH:  no palpable lymphadenopathy in the cervical, axillary or inguinal regions LUNGS: clear to auscultation b/l with normal respiratory effort HEART: regular rate & rhythm ABDOMEN:  normoactive bowel sounds , non tender, not distended. Extremity: no pedal edema PSYCH: alert & oriented x 3 with fluent speech NEURO: no focal motor/sensory deficits   LABORATORY DATA:  I have reviewed the data as listed .    Latest Ref Rng & Units 04/06/2024    9:01 AM 12/22/2023    1:34 PM 09/22/2023    1:10 PM  CBC  WBC 4.0 - 10.5 K/uL 6.0  8.0  7.6   Hemoglobin 13.0 - 17.0 g/dL 86.9  86.6  86.2   Hematocrit 39.0 - 52.0 % 40.4  40.2  42.4   Platelets 150 - 400 K/uL 210  183  193     CBC    Component Value Date/Time   WBC 6.0 04/06/2024 0901   WBC 8.0 07/31/2022 1556   RBC 3.90 (L) 04/06/2024 0901   HGB 13.0 04/06/2024 0901   HGB 14.0 06/11/2017 0808   HCT 40.4 04/06/2024 0901   HCT 40.7 06/11/2017 0808   PLT 210 04/06/2024 0901   PLT 223 06/11/2017 0808   MCV 103.6 (H) 04/06/2024 0901   MCV 112.1 (H) 06/11/2017 0808   MCH 33.3 04/06/2024 0901   MCHC 32.2 04/06/2024 0901   RDW 14.8 04/06/2024 0901   RDW 13.9 06/11/2017 0808   LYMPHSABS 1.5 04/06/2024 0901   LYMPHSABS 1.6 06/11/2017 0808   MONOABS 0.7 04/06/2024 0901   MONOABS 0.5 06/11/2017 0808   EOSABS 0.5 04/06/2024 0901   EOSABS 0.1 06/11/2017 0808   BASOSABS 0.1 04/06/2024 0901   BASOSABS 0.0 06/11/2017 0808   Lab Results  Component Value Date   IRON 103 04/06/2024   TIBC  192 (L) 04/06/2024   FERRITIN 1,326 (H) 04/06/2024       Latest Ref Rng & Units 04/06/2024    9:01 AM  12/22/2023    1:34 PM 09/22/2023    1:10 PM  CMP  Glucose 70 - 99 mg/dL 722  803  681   BUN 8 - 23 mg/dL 49  62  39   Creatinine 0.61 - 1.24 mg/dL 6.35  6.43  6.73   Sodium 135 - 145 mmol/L 140  139  139   Potassium 3.5 - 5.1 mmol/L 3.5  3.1  3.6   Chloride 98 - 111 mmol/L 112  109  115   CO2 22 - 32 mmol/L 17  20  16    Calcium  8.9 - 10.3 mg/dL 7.9  8.3  8.0   Total Protein 6.5 - 8.1 g/dL 6.6  6.3  6.0   Total Bilirubin 0.0 - 1.2 mg/dL 0.7  0.6  0.6   Alkaline Phos 38 - 126 U/L 145  157  147   AST 15 - 41 U/L 45  24  32   ALT 0 - 44 U/L 44  29  35    08/18/2019 CT Abdomen Pelvis Wo Contrast (Accession [id number])   08/18/2019 CT Chest Wo Contrast (Accession [id number])   RADIOGRAPHIC STUDIES: I have personally reviewed the radiological images as listed and agreed with the findings in the report. No results found.     ASSESSMENT & PLAN:   73 y.o. Caucasian male with  #1 Recurrent metastatic Renal cell carcinoma with multiple lung metastases. Has been on stable on 1st line Sutent  for >10yr. Has had SBRT to the dominant right lung nodule with partial response. No lab or clinical evidence of metastatic RCC progression at this time.  PET/CT 08/08/2016 with a couple of mildly enlarge LLL pulmonary nodules - no other overt evidence of RCC progression at this time.  PET CT scan 11/06/2016 - no overt evidence of RCC progression at this time .  PET/CT 03/13/2017 - no overt evidence of RCC progression at this time .  PET/CT 08/10/2017 - no overt evidence of RCC progression at this time .  PET/ CT done 08/10/2017, shows no evidence of  Progression at this time.  12/07/17 PET which revealed No findings suspicious for recurrent or metastatic disease.  03/22/18 PET/CT revealed One of the left lower lobe nodules has very minimally enlarged over the last 2 years, currently 0.6 by 0.4 cm and measuring 0.4 by 0.3 cm on 03/20/2016. This probably represents a previously treated metastatic nodule given  that it measured 1.0 by 0.8 cm on 06/06/2015. This nodule may be subject to some slice selection phenomenon and also motion artifact given proximity to the diaphragm. Surveillance is likely warranted. 2. Chronic central lucency, marginal bony deposition, and slow healing of the right lateral fourth rib fracture. Possibilities may include underlying radiation necrosis as a cause for fracture leading to slow healing, or a low-grade pathologic lesion. Maximum SUV at this fracture site is 2.9, previously 2.4. 3. 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 There is only low-grade activity in this vicinity characteristic of radiation fibrosis, without focal activity. 4. Other imaging findings of potential clinical significance: Aortic Atherosclerosis. Coronary atherosclerosis. Mild cardiomegaly. Right nephrectomy. 8 mm nonobstructive calculus in the left kidney lower pole.   08/24/18 CT C/A/P revealed Stable small left lower lobe pulmonary nodules from recent prior studies. As previously  noted, these have decreased in size from PET-CT 06/06/2015, likely treated metastases. No new or enlarging pulmonary nodules. 2. Postsurgical changes in the upper abdomen without evidence of local recurrence or metastatic disease. 3. Nonobstructing left renal calculus. 4. Stable radiation changes in the right upper lobe with probable chronic pathologic fracture of the right 4th rib laterally.  03/07/2019 CT C/A wo contrast revealed 1. Stable left lower lobe pulmonary nodules, likely treated Metastases. 2. No additional evidence of metastatic disease. 3. Left renal stone. 4. Post radiation scarring in the right hemithorax with a healed adjacent right rib fracture which is presumably pathologic in Etiology. 5.  Aortic atherosclerosis (ICD10-170.0). 6. Enlarged pulmonic trunk, indicative of pulmonary arterial Hypertension.  08/14/22 CT CAP: Extensive  surgical changes in the abdomen from right nephrectomy, splenectomy and presumed Whipple procedure. No abnormal soft tissue or lymph node enlargement seen in the abdomen and pelvis including towards the surgical bed. Multiple small lung nodules. These are similar in number but several have slightly increased in size. Stable fracture involving the lateral aspect of the right fourth rib with some irregularity. Please correlate for a metastatic lesion.  10/24/22 NM PET: Status post left nephrectomy. Small bilateral pulmonary nodules are non FDG avid but beneath the size threshold for PET sensitivity. Continued attention on follow-up is suggested. Otherwise, no findings suspicious for metastatic disease. Additional stable ancillary and postsurgical findings, as above. Aortic Atherosclerosis (ICD10-I70.0).  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 Dolphus for mx. Not currently on any medications  #6 DM2 -continue f/u with Dr Dylan for continue mx, blood sugar at home per patient.   #7Postsplenectomy status  #8 abnormal liver function tests.  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 LFTs WNL today  PLAN: - Patient lab results from today were discussed with him and his wife in detail. CBC shows hemoglobin of 13 with normal WBC count and platelets CMP creatinine continues to remain elevated at 3.64 AST 45 ALT at 145 Ferritin elevated at 1326 with an iron saturation of 54% Patient notes that he is feeling fatigued and weak today and has been sleeping poorly. He does not feel up to it today and his therapeutic phlebotomy was held at this time. He was recommended to drink adequate water and follow-up with his PCP/urologist if  continues to have any problems passing urine. - He continues to see Dr. Dennise from nephrology for continued monitoring and management of his CKD  FOLLOW-UP:  Return to clinic with Dr. Onesimo with labs in 3 months  .The total time spent in the appointment was 30 minutes* .  All of the patient's questions were answered with apparent satisfaction. The patient knows to call the clinic with any problems, questions or concerns.   Emaline Onesimo MD MS AAHIVMS [facility] Hematology/Oncology Physician [facility]  .*Total Encounter Time as defined by the Centers for Medicare and Medicaid Services includes, in addition to the face-to-face time of a patient visit (documented in the note above) non-face-to-face time: obtaining and reviewing outside history, ordering and reviewing medications, tests or procedures, care coordination (communications with other health care professionals or caregivers) and documentation in the medical record.

## 2024-07-12 ENCOUNTER — Other Ambulatory Visit: Payer: Self-pay

## 2024-07-12 DIAGNOSIS — C649 Malignant neoplasm of unspecified kidney, except renal pelvis: Secondary | ICD-10-CM

## 2024-07-13 ENCOUNTER — Inpatient Hospital Stay: Attending: Hematology | Admitting: Hematology

## 2024-07-13 ENCOUNTER — Inpatient Hospital Stay

## 2024-07-13 VITALS — BP 130/73 | HR 58 | Temp 97.3°F | Resp 20 | Wt 180.8 lb

## 2024-07-13 DIAGNOSIS — Z905 Acquired absence of kidney: Secondary | ICD-10-CM | POA: Insufficient documentation

## 2024-07-13 DIAGNOSIS — E119 Type 2 diabetes mellitus without complications: Secondary | ICD-10-CM | POA: Diagnosis not present

## 2024-07-13 DIAGNOSIS — C649 Malignant neoplasm of unspecified kidney, except renal pelvis: Secondary | ICD-10-CM | POA: Diagnosis not present

## 2024-07-13 DIAGNOSIS — C7801 Secondary malignant neoplasm of right lung: Secondary | ICD-10-CM | POA: Insufficient documentation

## 2024-07-13 DIAGNOSIS — N189 Chronic kidney disease, unspecified: Secondary | ICD-10-CM | POA: Insufficient documentation

## 2024-07-13 DIAGNOSIS — C642 Malignant neoplasm of left kidney, except renal pelvis: Secondary | ICD-10-CM | POA: Diagnosis present

## 2024-07-13 DIAGNOSIS — N184 Chronic kidney disease, stage 4 (severe): Secondary | ICD-10-CM | POA: Diagnosis not present

## 2024-07-13 DIAGNOSIS — M069 Rheumatoid arthritis, unspecified: Secondary | ICD-10-CM | POA: Diagnosis not present

## 2024-07-13 LAB — CBC WITH DIFFERENTIAL (CANCER CENTER ONLY)
Abs Immature Granulocytes: 0.03 K/uL (ref 0.00–0.07)
Basophils Absolute: 0.1 K/uL (ref 0.0–0.1)
Basophils Relative: 1 %
Eosinophils Absolute: 0.2 K/uL (ref 0.0–0.5)
Eosinophils Relative: 2 %
HCT: 42.2 % (ref 39.0–52.0)
Hemoglobin: 14.2 g/dL (ref 13.0–17.0)
Immature Granulocytes: 0 %
Lymphocytes Relative: 18 %
Lymphs Abs: 1.7 K/uL (ref 0.7–4.0)
MCH: 33.6 pg (ref 26.0–34.0)
MCHC: 33.6 g/dL (ref 30.0–36.0)
MCV: 99.8 fL (ref 80.0–100.0)
Monocytes Absolute: 1.1 K/uL — ABNORMAL HIGH (ref 0.1–1.0)
Monocytes Relative: 12 %
Neutro Abs: 6.6 K/uL (ref 1.7–7.7)
Neutrophils Relative %: 67 %
Platelet Count: 181 K/uL (ref 150–400)
RBC: 4.23 MIL/uL (ref 4.22–5.81)
RDW: 13.8 % (ref 11.5–15.5)
WBC Count: 9.7 K/uL (ref 4.0–10.5)
nRBC: 0 % (ref 0.0–0.2)

## 2024-07-13 LAB — IRON AND IRON BINDING CAPACITY (CC-WL,HP ONLY)
Iron: 106 ug/dL (ref 45–182)
Saturation Ratios: 46 % — ABNORMAL HIGH (ref 17.9–39.5)
TIBC: 232 ug/dL — ABNORMAL LOW (ref 250–450)
UIBC: 126 ug/dL

## 2024-07-13 LAB — CMP (CANCER CENTER ONLY)
ALT: 27 U/L (ref 0–44)
AST: 27 U/L (ref 15–41)
Albumin: 4 g/dL (ref 3.5–5.0)
Alkaline Phosphatase: 151 U/L — ABNORMAL HIGH (ref 38–126)
Anion gap: 14 (ref 5–15)
BUN: 55 mg/dL — ABNORMAL HIGH (ref 8–23)
CO2: 19 mmol/L — ABNORMAL LOW (ref 22–32)
Calcium: 8.8 mg/dL — ABNORMAL LOW (ref 8.9–10.3)
Chloride: 107 mmol/L (ref 98–111)
Creatinine: 3.45 mg/dL — ABNORMAL HIGH (ref 0.61–1.24)
GFR, Estimated: 18 mL/min — ABNORMAL LOW (ref >60)
Glucose, Bld: 299 mg/dL — ABNORMAL HIGH (ref 70–99)
Potassium: 3.8 mmol/L (ref 3.5–5.1)
Sodium: 140 mmol/L (ref 135–145)
Total Bilirubin: 0.8 mg/dL (ref 0.0–1.2)
Total Protein: 6.6 g/dL (ref 6.5–8.1)

## 2024-07-13 LAB — FERRITIN: Ferritin: 1027 ng/mL — ABNORMAL HIGH (ref 24–336)

## 2024-07-13 NOTE — Progress Notes (Signed)
 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 2) Remote history of metastatic renal cell carcinoma Diagnosed with renal cell carcinoma 20 years ago and had a right nephrectomy. About 8 years after that he was noted to have abdominal recurrence in his pancreas spleen and small intestine and had a Whipple's procedure and significant abdominal surgery and notes that 10 out of 17 lymph nodes were positive. He also had his gallbladder removed. Postoperative course was complicated by an internal hemorrhage as per his report. Patient notes 2-3 years after that he had recurrence in his thyroid  that led to a thyroidectomy. [June 2004] later he had partial gastrectomy for local recurrence. [February 2005] when he presented with GI bleeding. Patient notes that he has had no known evidence of recurrence over the last 8 years until his recent CT scan showed lung nodules.   Current Treatment: Active Surveillance (Sutent  on hold from 10/2020) Previous treatment Sutent  on cycle 1 - 50 mg by mouth daily for 4 weeks on and 2-weeks off. It was dose adjusted to 37.5 mg by mouth daily for 2 weeks with 1 week of to help mitigate issues with fatigue, mild hyperbilirubinemia, cytopenias. SBRT to dominant RML nodule.   SUMMARY OF ONCOLOGIC HISTORY: Oncology History   No history exists.    INTERVAL HISTORY:  Dylan Jefferson is a 73 y.o. male who is here today for continued evaluation and management of management of metastatic renal cell carcinoma. He is accompanied by his wife. He was last  seen by me on 04/06/2024; at the time he did not have any concerns and was doing well.   Today, he states that he is feeling well overall. He notes that his energy level is mostly back to normal, aside from some days when he overexerts himself.   Dr. Dennise took him off calcium  citrate and Vitamin D . Cholecalciferol discontinued and replaced by calcitriol. Patient reports dose decrease in levothyroxine  medication by his primary doctor.  He does note pain in his knees and shoulders due to the rheumatoid arthritis.   Denies fevers, chills, night sweats, leg swelling, new back pain, and bone pain.  REVIEW OF SYSTEMS:   10 Point review of systems of done and is negative except as noted above.  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   Sero Negative, Ultrasound positive synovitis    Kidney disease    Macular degeneration    Bilateral eyes   Nephrolithiasis    Osteoarthritis of both hands 05/28/2016   Osteoarthritis of both knees 05/28/2016   Peripheral vascular disorder 05/28/2016   Peyronie's syndrome    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   Rheumatoid arthritis (HCC)    Thyroid  cancer (HCC) 05/28/2016    SURGICAL HISTORY Past Surgical History:  Procedure Laterality Date   CATARACT EXTRACTION Bilateral 2013  CHOLECYSTECTOMY     CYSTOSCOPY/URETEROSCOPY/HOLMIUM LASER/STENT PLACEMENT Left 03/31/2022   Procedure: CYSTOSCOPY/ RETROGRADE/URETEROSCOPY/HOLMIUM LASER/STENT PLACEMENT;  Surgeon: Renda Glance, MD;  Location: [facility];  Service: Urology;  Laterality: Left;   GASTRECTOMY     tumor removed   GLAUCOMA SURGERY     Laser surgery   IR US  GUIDE VASC ACCESS RIGHT   08/22/2021   IR VENOGRAM HEPATIC W HEMODYNAMIC EVALUATION  08/22/2021   LIPOMA EXCISION Right 1999   NEPHRECTOMY Right    PANCREATECTOMY     SPLENECTOMY     THYROIDECTOMY     URETERAL STENT PLACEMENT Left 2012   WHIPPLE PROCEDURE      SOCIAL HISTORY Social History   Tobacco Use   Smoking status: Former    Types: Cigarettes   Smokeless tobacco: Never  Vaping Use   Vaping status: Never Used  Substance Use Topics   Alcohol use: Yes    Comment: occasional beer   Drug use: No    Social History   Social History Narrative   Not on file    SOCIAL DRIVERS OF HEALTH SDOH Screenings   Food Insecurity: No Food Insecurity (10/03/2022)  Housing: Low Risk  (10/03/2022)  Transportation Needs: No Transportation Needs (10/03/2022)  Utilities: Not At Risk (10/03/2022)  Financial Resource Strain: Low Risk  (10/03/2022)  Tobacco Use: Low Risk (01/08/2024)   Received from [facility]     FAMILY HISTORY Family History  Problem Relation Age of Onset   Bleeding Disorder Mother        Unknown what type   Osteoporosis Mother    Heart disease Mother    Diabetes Mother    Hypertension Mother    Lymphoma Father        Hodgkin's lymphoma   Other Daughter        Brain stent   Colon cancer Neg Hx      ALLERGIES: is allergic to carvedilol , iodine, and prozac [fluoxetine hcl].  MEDICATIONS  Current Outpatient Medications  Medication Sig Dispense Refill   aspirin  81 MG tablet Take 81 mg by mouth daily.     atenolol  (TENORMIN ) 50 MG tablet Take 50 mg by mouth daily. (Patient not taking: Reported on 09/23/2023)     augmented betamethasone dipropionate (DIPROLENE-AF) 0.05 % cream Apply 1 application topically daily as needed (rash).   0   azelastine (OPTIVAR) 0.05 % ophthalmic solution Place 1 drop into both eyes in the morning and at bedtime.     calcium  citrate-vitamin D  (CITRACAL+D) 315-200 MG-UNIT per tablet Take 1 tablet by mouth 2 (two) times daily.     Cholecalciferol (VITAMIN D3) 50 MCG  (2000 UT) TABS Take 2,000 Units by mouth daily. 2 Tablets daily     Cyanocobalamin  (VITAMIN B-12) 2500 MCG SUBL Take 2,500 mcg by mouth daily.     diclofenac  Sodium (VOLTAREN ) 1 % GEL Apply 3 g topically 3 (three) times daily as needed (joint pain).      dicyclomine  (BENTYL ) 10 MG capsule Take 1 capsule (10 mg total) by mouth every 8 (eight) hours as needed for spasms. 30 capsule 1   docusate sodium (COLACE) 100 MG capsule Take 100 mg by mouth 2 (two) times daily as needed for mild constipation.      dorzolamide -timolol  (COSOPT ) 22.3-6.8 MG/ML ophthalmic solution Place 1 drop into both eyes 2 (two) times daily.   1   famotidine  (PEPCID ) 20 MG tablet Take 20 mg by mouth 3 (three) times daily.     gabapentin (NEURONTIN) 300 MG  capsule Take 300 mg by mouth daily as needed (pain).     Glucosamine-MSM-Hyaluronic Acd 750-375-30 MG TABS Take 2 tablets by mouth daily.     hydrALAZINE  (APRESOLINE ) 50 MG tablet Take 50 mg by mouth 3 (three) times daily.     HYDROcodone-acetaminophen  (NORCO/VICODIN) 5-325 MG tablet Take 1 tablet by mouth every 6 (six) hours as needed for severe pain.     insulin  lispro (HUMALOG) 100 UNIT/ML injection Inject 2-8 Units into the skin 4 (four) times daily -  with meals and at bedtime. Per sliding scale     Lactobacillus (ACIDOPHILUS) CAPS capsule Take 1 capsule by mouth daily.     latanoprost  (XALATAN ) 0.005 % ophthalmic solution Place 1 drop into both eyes at bedtime.   1   LEVEMIR  100 UNIT/ML injection Inject 8-10 Units into the skin See admin instructions. Inject 8 units into the skin in the morning and 10 units at night (Patient not taking: Reported on 08/10/2023)  1   levothyroxine  (SYNTHROID ) 125 MCG tablet Take 250 mcg by mouth daily before breakfast.     losartan (COZAAR) 25 MG tablet Take 25 mg by mouth daily. (Patient not taking: Reported on 08/10/2023)     lovastatin (MEVACOR) 40 MG tablet Take 40 mg by mouth at bedtime.      Multiple Vitamins-Minerals (PRESERVISION AREDS  2) CAPS Take 1 capsule by mouth 2 (two) times daily.     Omega-3 Fatty Acids (FISH OIL) 1200 MG CAPS Take 1 capsule by mouth daily.     omeprazole  (PRILOSEC) 20 MG capsule Take 1 capsule (20 mg total) by mouth daily. (Patient taking differently: Take 20 mg by mouth at bedtime as needed (acid reflux).) 30 capsule 3   Pancrelipase , Lip-Prot-Amyl, 6000-19000 units CPEP Take 4 capsules by mouth 3 (three) times daily.     sildenafil (VIAGRA) 100 MG tablet Take 100 mg by mouth daily as needed for erectile dysfunction.     torsemide  (DEMADEX ) 20 MG tablet Take 40 mg by mouth in the morning, at noon, and at bedtime.     traMADol  (ULTRAM ) 50 MG tablet Take 50 mg by mouth 2 (two) times daily.     TRESIBA 100 UNIT/ML SOLN Inject 8 Units into the skin 2 (two) times daily. Take 8 Units q am and 10 units q pm     No current facility-administered medications for this visit.    PHYSICAL EXAMINATION: ECOG PERFORMANCE STATUS: 1 - Symptomatic but completely ambulatory VITALS: Vitals:   07/13/24 0928  BP: 130/73  Pulse: (!) 58  Resp: 20  Temp: (!) 97.3 F (36.3 C)  SpO2: 96%   Filed Weights   07/13/24 0928  Weight: 180 lb 12.8 oz (82 kg)   Body mass index is 25.94 kg/m.  GENERAL: alert, in no acute distress and comfortable SKIN: no acute rashes, no significant lesions EYES: conjunctiva are pink and non-injected, sclera anicteric OROPHARYNX: MMM, no exudates, no oropharyngeal erythema or ulceration NECK: supple, no JVD LYMPH:  no palpable lymphadenopathy in the cervical, axillary or inguinal regions LUNGS: clear to auscultation b/l with normal respiratory effort HEART: regular rate & rhythm ABDOMEN:  normoactive bowel sounds , non tender, not distended, no hepatosplenomegaly Extremity: no pedal edema PSYCH: alert & oriented x 3 with fluent speech NEURO: no focal motor/sensory deficits  LABORATORY DATA:   I have reviewed the data as listed     Latest Ref Rng & Units 07/13/2024    8:56 AM  04/06/2024    9:01 AM  12/22/2023    1:34 PM  CBC EXTENDED  WBC 4.0 - 10.5 K/uL 9.7  6.0  8.0   RBC 4.22 - 5.81 MIL/uL 4.23  3.90  3.99   Hemoglobin 13.0 - 17.0 g/dL 85.7  86.9  86.6   HCT 39.0 - 52.0 % 42.2  40.4  40.2   Platelets 150 - 400 K/uL 181  210  183   NEUT# 1.7 - 7.7 K/uL 6.6  3.3  3.9   Lymph# 0.7 - 4.0 K/uL 1.7  1.5  2.3         Latest Ref Rng & Units 07/13/2024    8:56 AM 04/06/2024    9:01 AM 12/22/2023    1:34 PM  CMP  Glucose 70 - 99 mg/dL 700  722  803   BUN 8 - 23 mg/dL 55  49  62   Creatinine 0.61 - 1.24 mg/dL 6.54  6.35  6.43   Sodium 135 - 145 mmol/L 140  140  139   Potassium 3.5 - 5.1 mmol/L 3.8  3.5  3.1   Chloride 98 - 111 mmol/L 107  112  109   CO2 22 - 32 mmol/L 19  17  20    Calcium  8.9 - 10.3 mg/dL 8.8  7.9  8.3   Total Protein 6.5 - 8.1 g/dL 6.6  6.6  6.3   Total Bilirubin 0.0 - 1.2 mg/dL 0.8  0.7  0.6   Alkaline Phos 38 - 126 U/L 151  145  157   AST 15 - 41 U/L 27  45  24   ALT 0 - 44 U/L 27  44  29    . Lab Results  Component Value Date   IRON 106 07/13/2024   TIBC 232 (L) 07/13/2024   IRONPCTSAT 46 (H) 07/13/2024   Ferritin pending.  08/18/2019 CT Abdomen Pelvis Wo Contrast (Accession [id number])   08/18/2019 CT Chest Wo Contrast (Accession [id number])    RADIOGRAPHIC STUDIES: I have personally reviewed the radiological images as listed and agreed with the findings in the report. No results found.  ASSESSMENT & PLAN:   73 y.o. male with  #1 Recurrent metastatic Renal cell carcinoma with multiple lung metastases. Has been on stable on 1st line Sutent  for >31yr. Has had SBRT to the dominant right lung nodule with partial response. No lab or clinical evidence of metastatic RCC progression at this time.   PET/CT 08/08/2016 with a couple of mildly enlarge LLL pulmonary nodules - no other overt evidence of RCC progression at this time.   PET CT scan 11/06/2016 - no overt evidence of RCC progression at this time .   PET/CT 03/13/2017 - no  overt evidence of RCC progression at this time .   PET/CT 08/10/2017 - no overt evidence of RCC progression at this time .   PET/ CT done 08/10/2017, shows no evidence of  Progression at this time.   12/07/17 PET which revealed No findings suspicious for recurrent or metastatic disease.   03/22/18 PET/CT revealed One of the left lower lobe nodules has very minimally enlarged over the last 2 years, currently 0.6 by 0.4 cm and measuring 0.4 by 0.3 cm on 03/20/2016. This probably represents a previously treated metastatic nodule given that it measured 1.0 by 0.8 cm on 06/06/2015. This nodule may be subject to some slice selection phenomenon and also motion artifact given proximity to the diaphragm. Surveillance is likely warranted. 2. Chronic central lucency, marginal bony deposition, and slow healing of the  right lateral fourth rib fracture. Possibilities may include underlying radiation necrosis as a cause for fracture leading to slow healing, or a low-grade pathologic lesion. Maximum SUV at this fracture site is 2.9, previously 2.4. 3. 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 There is only low-grade activity in this vicinity characteristic of radiation fibrosis, without focal activity. 4. Other imaging findings of potential clinical significance: Aortic Atherosclerosis. Coronary atherosclerosis. Mild cardiomegaly. Right nephrectomy. 8 mm nonobstructive calculus in the left kidney lower pole.    08/24/18 CT C/A/P revealed Stable small left lower lobe pulmonary nodules from recent prior studies. As previously noted, these have decreased in size from PET-CT 06/06/2015, likely treated metastases. No new or enlarging pulmonary nodules. 2. Postsurgical changes in the upper abdomen without evidence of local recurrence or metastatic disease. 3. Nonobstructing left renal calculus. 4. Stable radiation changes in the right upper lobe with  probable chronic pathologic fracture of the right 4th rib laterally.   03/07/2019 CT C/A wo contrast revealed 1. Stable left lower lobe pulmonary nodules, likely treated Metastases. 2. No additional evidence of metastatic disease. 3. Left renal stone. 4. Post radiation scarring in the right hemithorax with a healed adjacent right rib fracture which is presumably pathologic in Etiology. 5.  Aortic atherosclerosis (ICD10-170.0). 6. Enlarged pulmonic trunk, indicative of pulmonary arterial Hypertension.   08/14/22 CT CAP: Extensive surgical changes in the abdomen from right nephrectomy, splenectomy and presumed Whipple procedure. No abnormal soft tissue or lymph node enlargement seen in the abdomen and pelvis including towards the surgical bed. Multiple small lung nodules. These are similar in number but several have slightly increased in size. Stable fracture involving the lateral aspect of the right fourth rib with some irregularity. Please correlate for a metastatic lesion.   10/24/22 NM PET: Status post left nephrectomy. Small bilateral pulmonary nodules are non FDG avid but beneath the size threshold for PET sensitivity. Continued attention on follow-up is suggested. Otherwise, no findings suspicious for metastatic disease. Additional stable ancillary and postsurgical findings, as above. Aortic Atherosclerosis (ICD10-I70.0).   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 Dolphus for mx. Not currently on any medications   #6 DM2 -continue f/u with Dr Yolande for continue mx, blood sugar at home per patient.    #7Postsplenectomy status   #8 abnormal liver function tests.  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 LFTs WNL  today  PLAN: - Discussed lab results on 07/13/2024 in detail with patient: CBC is stable with a hemoglobin of 14.2 WBC count of 9.7k and platelets of 181k. CMP shows stable chronic kidney disease stage IV with a creatinine of 3.45.  Calcium  8.8. Patient has been switched to oral calcitriol by his nephrologist. Ferritin is pending ,iron saturation is 46%. - Discussed new changes in medication list - Recommended that he receive the Shingles vaccine to be up to date and also also the other age-appropriate vaccinations with PCP. Patient has no signs or symptoms suggestive of progression of his metastatic renal cell carcinoma at this time. We will plan to do surveillance imaging with CT chest abdomen pelvis without contrast in 10 weeks - His remaining iron labs to determine need for therapeutic  phlebotomy.  His goals for phlebotomy would be to try to keep his ferritin less than 1000 and iron saturation less than 60% if possible without creating anemia.  Currently LFTs are within normal limits. - Patient does have significant acute phase reactant reaction with overestimation of his ferritin levels in the context of his treated rheumatoid arthritis and chronic kidney disease and therefore the more liberal ferritin levels.  FOLLOW-UP   CT chest abdomen pelvis without contrast in 10 weeks Return to clinic with Dr. Onesimo with labs in 12 weeks   The total time spent in the appointment was 30 minutes*   All of the patient's questions were answered and the patient knows to call the clinic with any problems, questions, or concerns.  Dylan Onesimo MD MS AAHIVMS [facility] Hematology/Oncology Physician [facility]  *Total Encounter Time as defined by the Centers for Medicare and Medicaid Services includes, in addition to the face-to-face time of a patient visit (documented in the note above) non-face-to-face time: obtaining and reviewing outside history, ordering and reviewing medications, tests  or procedures, care coordination (communications with other health care professionals or caregivers) and documentation in the medical record.  I, Dylan Jefferson, acting as a neurosurgeon for Dylan Onesimo, MD.,have documented all relevant documentation on the behalf of Dylan Onesimo, MD,as directed by  Dylan Onesimo, MD while in the presence of Dylan Onesimo, MD.  I have reviewed the above documentation for accuracy and completeness, and I agree with the above.  Dylan Gatt, MD

## 2024-09-21 ENCOUNTER — Inpatient Hospital Stay

## 2024-09-21 ENCOUNTER — Ambulatory Visit (HOSPITAL_COMMUNITY)

## 2024-10-14 ENCOUNTER — Inpatient Hospital Stay

## 2024-10-14 ENCOUNTER — Inpatient Hospital Stay: Admitting: Hematology
